# Patient Record
Sex: Male | Born: 1955 | Race: White | Hispanic: No | Marital: Single | Smoking: Never smoker
Health system: Southern US, Community
[De-identification: ages and names within clinical notes are randomized; demographics above are authoritative.]

## PROBLEM LIST (undated history)

## (undated) DIAGNOSIS — M1990 Unspecified osteoarthritis, unspecified site: Secondary | ICD-10-CM

## (undated) DIAGNOSIS — I10 Essential (primary) hypertension: Secondary | ICD-10-CM

## (undated) DIAGNOSIS — K649 Unspecified hemorrhoids: Secondary | ICD-10-CM

## (undated) DIAGNOSIS — E785 Hyperlipidemia, unspecified: Secondary | ICD-10-CM

## (undated) DIAGNOSIS — C801 Malignant (primary) neoplasm, unspecified: Secondary | ICD-10-CM

## (undated) HISTORY — DX: Malignant (primary) neoplasm, unspecified: C80.1

## (undated) HISTORY — PX: MANDIBLE SURGERY: SHX707

## (undated) HISTORY — DX: Unspecified hemorrhoids: K64.9

## (undated) HISTORY — DX: Hyperlipidemia, unspecified: E78.5

## (undated) HISTORY — PX: TONSILLECTOMY: SUR1361

## (undated) HISTORY — DX: Essential (primary) hypertension: I10

---

## 2013-09-11 ENCOUNTER — Ambulatory Visit: Payer: Self-pay | Admitting: General Surgery

## 2017-01-30 ENCOUNTER — Encounter: Payer: Self-pay | Admitting: General Surgery

## 2017-02-28 ENCOUNTER — Encounter: Payer: Self-pay | Admitting: General Surgery

## 2017-02-28 ENCOUNTER — Ambulatory Visit (INDEPENDENT_AMBULATORY_CARE_PROVIDER_SITE_OTHER): Payer: BLUE CROSS/BLUE SHIELD | Admitting: General Surgery

## 2017-02-28 VITALS — BP 122/68 | HR 98 | Resp 12 | Ht 69.0 in | Wt 225.0 lb

## 2017-02-28 DIAGNOSIS — Z1211 Encounter for screening for malignant neoplasm of colon: Secondary | ICD-10-CM | POA: Diagnosis not present

## 2017-02-28 MED ORDER — POLYETHYLENE GLYCOL 3350 17 GM/SCOOP PO POWD: ORAL | 0 refills | Status: DC

## 2017-02-28 NOTE — Progress Notes (Signed)
Patient ID: Ivan Garcia., male   DOB: [birth year], 61 y.o.   MRN: [id number]  Chief Complaint  Patient presents with  . Colonoscopy    HPI [name]. is a 61 y.o. male here today for a evaluation of a screening colonoscopy. Patient states no GI problems at this time. Moves his bowels two to three times daily.  HPI  Past Medical History:  Diagnosis Date  . Hemorrhoids   . Hyperlipidemia   . Hypertension     Past Surgical History:  Procedure Laterality Date  . TONSILLECTOMY      Family History  Problem Relation Age of Onset  . Breast cancer Mother   . Prostate cancer Father   . Brain cancer Father     Social History Social History  Substance Use Topics  . Smoking status: Never Smoker  . Smokeless tobacco: Never Used  . Alcohol use No    No Known Allergies  Current Outpatient Prescriptions  Medication Sig Dispense Refill  . aspirin EC 81 MG tablet Take 81 mg by mouth daily.    . Biotin 10000 MCG TABS Take by mouth.    . irbesartan (AVAPRO) 150 MG tablet Take 150 mg by mouth daily.  2  . Multiple Vitamins-Minerals (MULTIVITAMIN WITH MINERALS) tablet Take 1 tablet by mouth daily.    . niacin 500 MG tablet Take 500 mg by mouth at bedtime.    . rosuvastatin (CRESTOR) 10 MG tablet Take 10 mg by mouth every evening.  4  . zinc gluconate 50 MG tablet Take 50 mg by mouth daily.    . polyethylene glycol powder (GLYCOLAX/MIRALAX) powder 255 grams one bottle for colonoscopy prep 255 g 0   No current facility-administered medications for this visit.     Review of Systems Review of Systems  Constitutional: Negative.   Respiratory: Negative.   Cardiovascular: Negative.   Gastrointestinal: Negative.     Blood pressure 122/68, pulse 98, resp. rate 12, height 5\' 9"  (1.753 m), weight 225 lb (102.1 kg).  Physical Exam Physical Exam  Constitutional: He is oriented to person, place, and time. He appears well-developed and well-nourished.  Cardiovascular:  Normal rate, regular rhythm and normal heart sounds.   Pulmonary/Chest: Effort normal and breath sounds normal.  Abdominal: Normal appearance.  Neurological: He is alert and oriented to person, place, and time.  Skin: Skin is warm and dry.    Data Reviewed Laboratory studies dated 01/12/2017 showed a creatinine of 1.03 with an estimated GFR 78, normal electrolytes. Normal PSA. Elevated cholesterol.  Assessment    Candidate for screening colonoscopy.    Plan        Colonoscopy with possible biopsy/polypectomy prn: Information regarding the procedure, including its potential risks and complications (including but not limited to perforation of the bowel, which may require emergency surgery to repair, and bleeding) was verbally given to the patient. Educational information regarding lower intestinal endoscopy was given to the patient. Written instructions for how to complete the bowel prep using Miralax were provided. The importance of drinking ample fluids to avoid dehydration as a result of the prep emphasized.  Patient has been scheduled for a colonoscopy on 04-04-17 at [facility]. It is okay for patient to continue 81 mg aspirin once daily.   This information has been scribed by Gaspar Cola CMA.   Robert Bellow 02/28/2017, 9:58 AM

## 2017-02-28 NOTE — Patient Instructions (Signed)
Colonoscopy, Adult A colonoscopy is an exam to look at the entire large intestine. During the exam, a lubricated, bendable tube is inserted into the anus and then passed into the rectum, colon, and other parts of the large intestine. A colonoscopy is often done as a part of normal colorectal screening or in response to certain symptoms, such as anemia, persistent diarrhea, abdominal pain, and blood in the stool. The exam can help screen for and diagnose medical problems, including:  Tumors.  Polyps.  Inflammation.  Areas of bleeding. Tell a health care provider about:  Any allergies you have.  All medicines you are taking, including vitamins, herbs, eye drops, creams, and over-the-counter medicines.  Any problems you or family members have had with anesthetic medicines.  Any blood disorders you have.  Any surgeries you have had.  Any medical conditions you have.  Any problems you have had passing stool. What are the risks? Generally, this is a safe procedure. However, problems may occur, including:  Bleeding.  A tear in the intestine.  A reaction to medicines given during the exam.  Infection (rare). What happens before the procedure? Eating and drinking restrictions  Follow instructions from your health care provider about eating and drinking, which may include:  A few days before the procedure - follow a low-fiber diet. Avoid nuts, seeds, dried fruit, raw fruits, and vegetables.  1-3 days before the procedure - follow a clear liquid diet. Drink only clear liquids, such as clear broth or bouillon, black coffee or tea, clear juice, clear soft drinks or sports drinks, gelatin dessert, and popsicles. Avoid any liquids that contain red or purple dye.  On the day of the procedure - do not eat or drink anything during the 2 hours before the procedure, or within the time period that your health care provider recommends. Bowel prep  If you were prescribed an oral bowel prep to  clean out your colon:  Take it as told by your health care provider. Starting the day before your procedure, you will need to drink a large amount of medicated liquid. The liquid will cause you to have multiple loose stools until your stool is almost clear or light green.  If your skin or anus gets irritated from diarrhea, you may use these to relieve the irritation:  Medicated wipes, such as adult wet wipes with aloe and vitamin E.  A skin soothing-product like petroleum jelly.  If you vomit while drinking the bowel prep, take a break for up to 60 minutes and then begin the bowel prep again. If vomiting continues and you cannot take the bowel prep without vomiting, call your health care provider. General instructions   Ask your health care provider about changing or stopping your regular medicines. This is especially important if you are taking diabetes medicines or blood thinners.  Plan to have someone take you home from the hospital or clinic. What happens during the procedure?  An IV tube may be inserted into one of your veins.  You will be given medicine to help you relax (sedative).  To reduce your risk of infection:  Your health care team will wash or sanitize their hands.  Your anal area will be washed with soap.  You will be asked to lie on your side with your knees bent.  Your health care provider will lubricate a long, thin, flexible tube. The tube will have a camera and a light on the end.  The tube will be inserted into your anus.    The tube will be gently eased through your rectum and colon.  Air will be delivered into your colon to keep it open. You may feel some pressure or cramping.  The camera will be used to take images during the procedure.  A small tissue sample may be removed from your body to be examined under a microscope (biopsy). If any potential problems are found, the tissue will be sent to a lab for testing.  If small polyps are found, your health  care provider may remove them and have them checked for cancer cells.  The tube that was inserted into your anus will be slowly removed. The procedure may vary among health care providers and hospitals. What happens after the procedure?  Your blood pressure, heart rate, breathing rate, and blood oxygen level will be monitored until the medicines you were given have worn off.  Do not drive for 24 hours after the exam.  You may have a small amount of blood in your stool.  You may pass gas and have mild abdominal cramping or bloating due to the air that was used to inflate your colon during the exam.  It is up to you to get the results of your procedure. Ask your health care provider, or the department performing the procedure, when your results will be ready. This information is not intended to replace advice given to you by your health care provider. Make sure you discuss any questions you have with your health care provider. Document Released: 11/17/2000 Document Revised: 09/20/2016 Document Reviewed: 02/01/2016 Elsevier Interactive Patient Education  2017 [employer]  

## 2017-03-10 IMAGING — US US BIOPSY LYMPH NODE
1 series · 14 of 25 positions shown · non-contrast
Comparison: none

INDICATION: Right neck mass

[Series 1: us biopsy lymph node · 0.08mm/px · 14 of 32 slices shown]
[im 1/32]
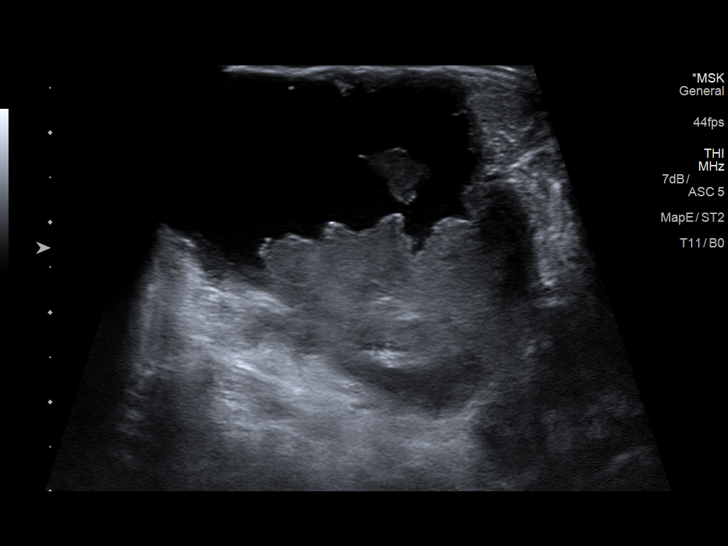
[im 3/32]
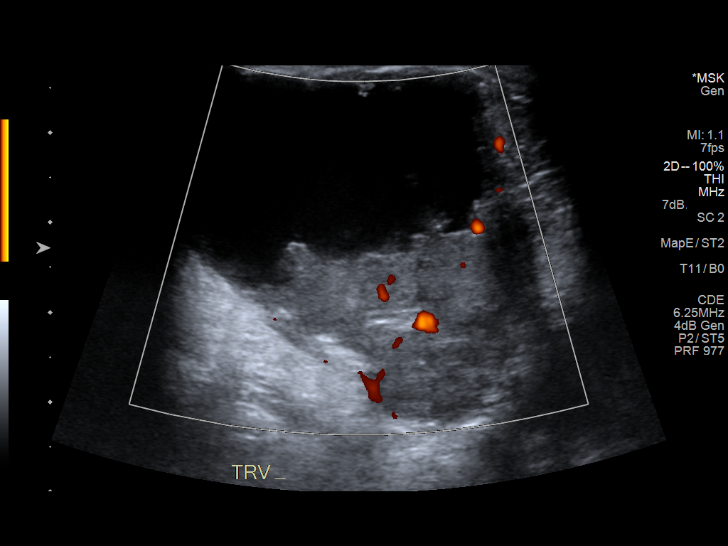
[im 6/32]
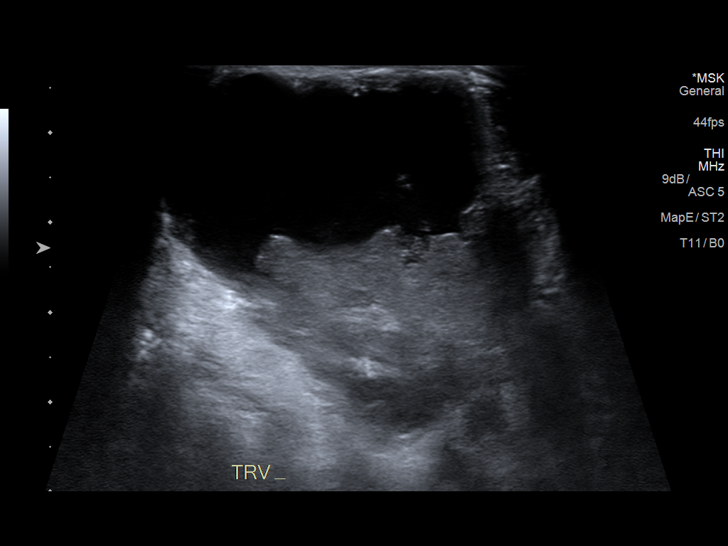
[im 8/32]
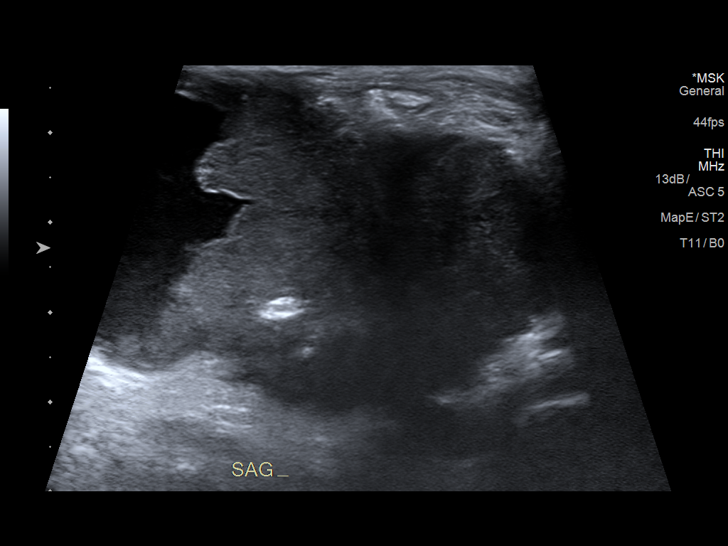
[im 11/32]
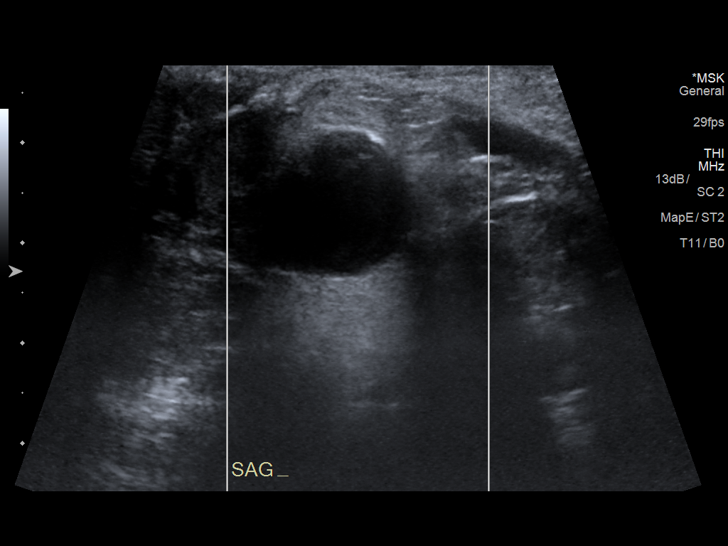
[im 12/32]
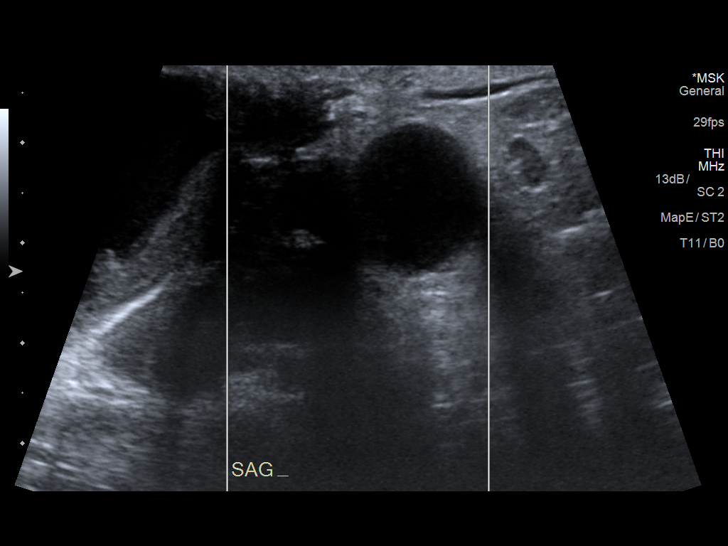
[im 15/32]
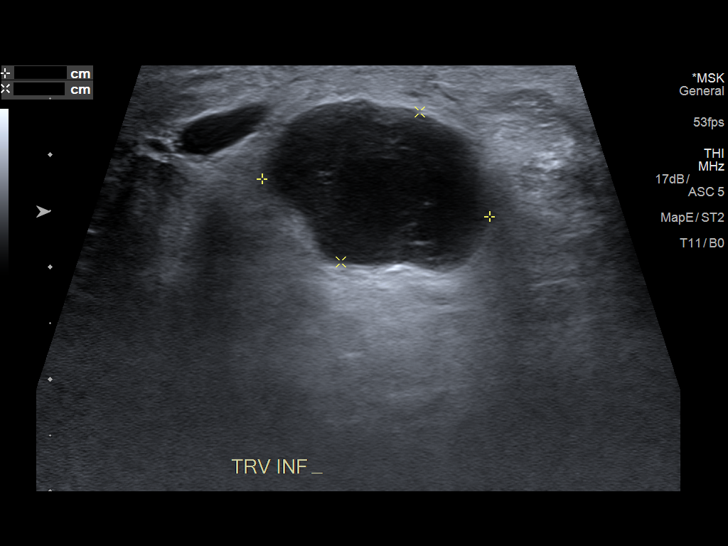
[im 17/32]
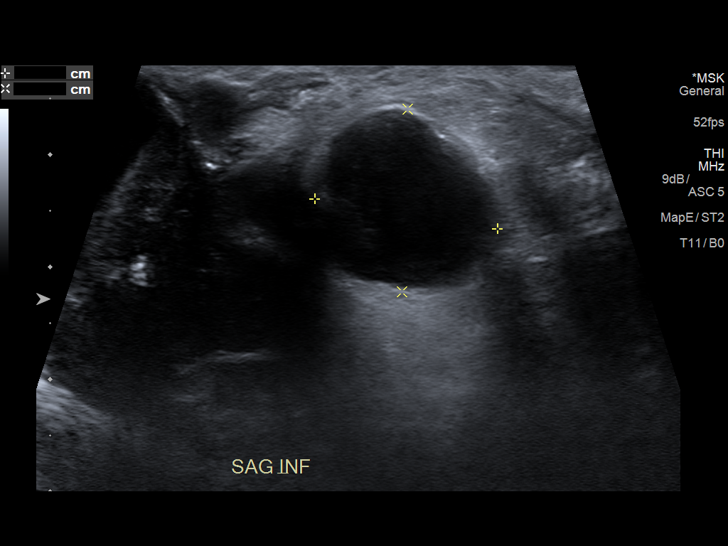
[im 20/32]
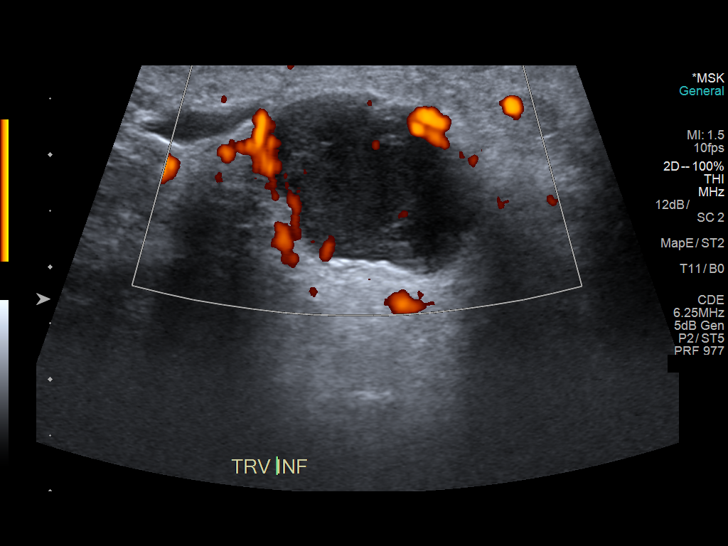
[im 21/32]
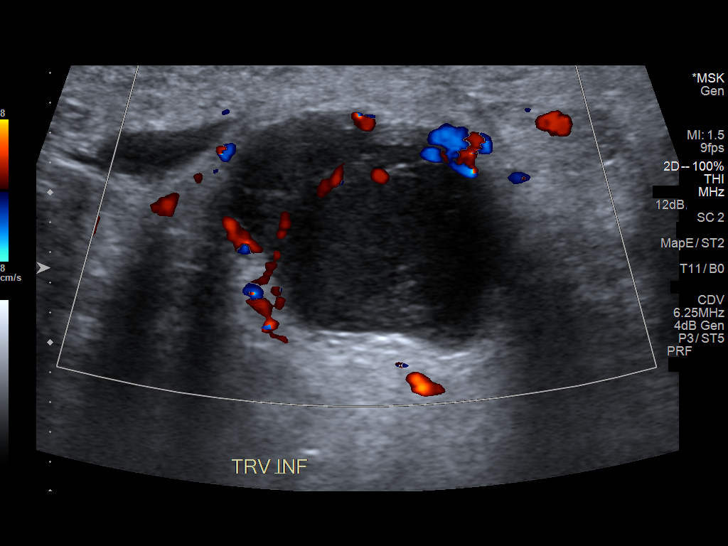
[im 24/32]
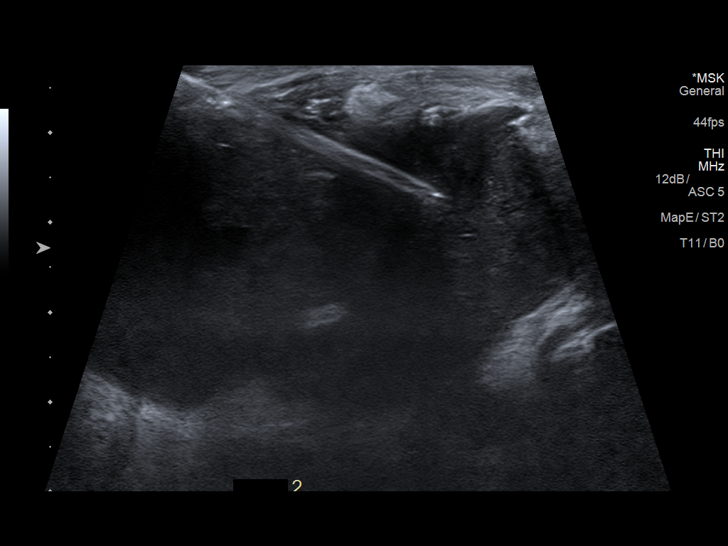
[im 26/32]
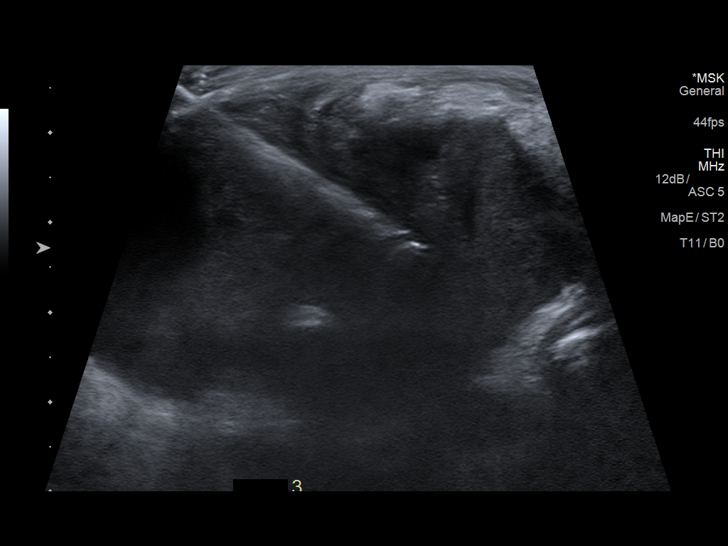
[im 29/32]
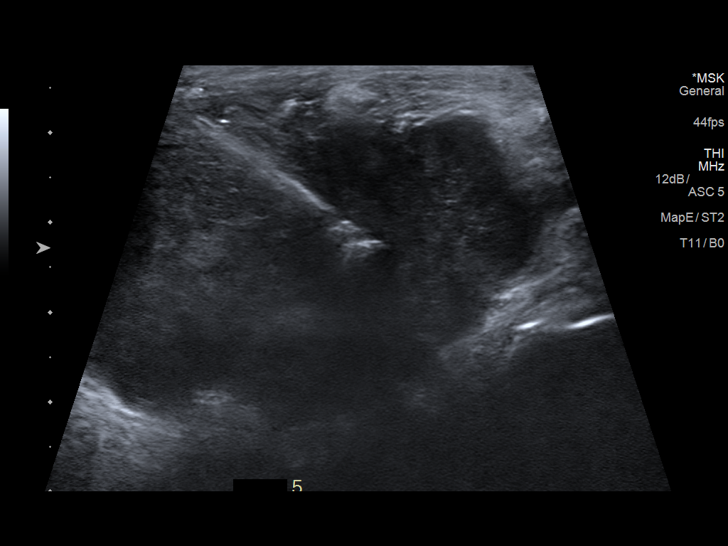
[im 32/32]
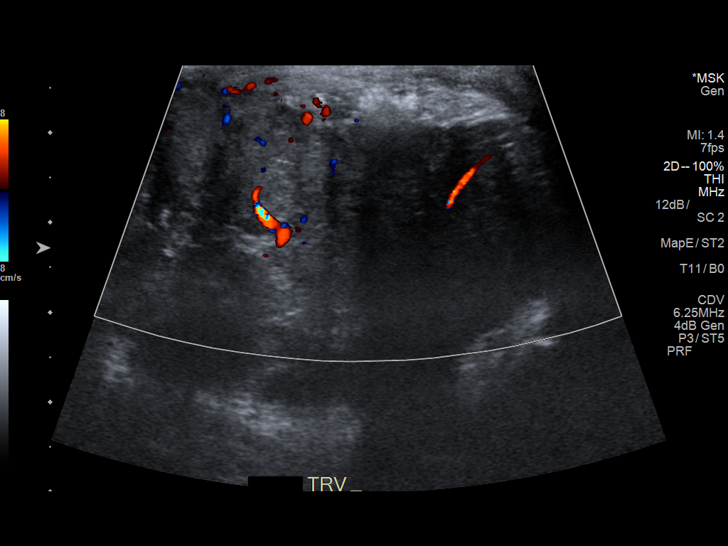

[14 of 25 positions shown; findings below may reference images not displayed]

EXAM:
ULTRASOUND GUIDED CORE BIOPSY OF A RIGHT NECK MASS

MEDICATIONS:
None.

ANESTHESIA/SEDATION:
Fentanyl 75 mcg IV; Versed Three mg IV

Moderate Sedation Time:  13

The patient was continuously monitored during the procedure by the
interventional radiology nurse under my direct supervision.

PROCEDURE:
The procedure, risks, benefits, and alternatives were explained to
the patient. Questions regarding the procedure were encouraged and
answered. The patient understands and consents to the procedure.

The right neck was prepped with ChloraPrep in a sterile fashion, and
a sterile drape was applied covering the operative field. A sterile
gown and sterile gloves were used for the procedure. Local
anesthesia was provided with 1% Lidocaine.

Under ultrasound guidance, 6 18 gauge cores of the right neck mass
were obtained. Samples were placed in both saline hand formalin.

COMPLICATIONS:
None immediate.
FINDINGS: Images document needle placement in the right neck mass. Post biopsy
images demonstrate no evidence of hemorrhage.
IMPRESSION: Successful ultrasound-guided core biopsy of a right neck mass.

## 2017-03-28 ENCOUNTER — Telehealth: Payer: Self-pay | Admitting: *Deleted

## 2017-03-28 NOTE — Telephone Encounter (Signed)
Patient was contacted today and confirms no medication changes since his last office visit.   This patient reports that he has picked up Miralax prescription.  We will proceed with colonoscopy as scheduled for 04-04-17 at [facility].   Patient was encouraged to call the office should he have further questions.

## 2017-04-02 DIAGNOSIS — I10 Essential (primary) hypertension: Secondary | ICD-10-CM | POA: Diagnosis not present

## 2017-04-02 DIAGNOSIS — Z7982 Long term (current) use of aspirin: Secondary | ICD-10-CM | POA: Diagnosis not present

## 2017-04-02 DIAGNOSIS — K5730 Diverticulosis of large intestine without perforation or abscess without bleeding: Secondary | ICD-10-CM | POA: Diagnosis not present

## 2017-04-02 DIAGNOSIS — E785 Hyperlipidemia, unspecified: Secondary | ICD-10-CM | POA: Diagnosis not present

## 2017-04-02 DIAGNOSIS — Z79899 Other long term (current) drug therapy: Secondary | ICD-10-CM | POA: Diagnosis not present

## 2017-04-02 DIAGNOSIS — Z1211 Encounter for screening for malignant neoplasm of colon: Secondary | ICD-10-CM | POA: Diagnosis present

## 2017-04-04 ENCOUNTER — Encounter
Admission: RE | Disposition: A | Discharge status: Home / Self Care | Payer: Self-pay | Source: Ambulatory Visit | Attending: General Surgery

## 2017-04-04 ENCOUNTER — Encounter: Payer: Self-pay | Admitting: *Deleted

## 2017-04-04 ENCOUNTER — Ambulatory Visit: Payer: BLUE CROSS/BLUE SHIELD | Admitting: Anesthesiology

## 2017-04-04 ENCOUNTER — Ambulatory Visit
Admission: RE | Admit: 2017-04-04 | Discharge: 2017-04-04 | Disposition: A | Discharge status: Home / Self Care | Payer: BLUE CROSS/BLUE SHIELD | Source: Ambulatory Visit | Attending: General Surgery | Admitting: General Surgery

## 2017-04-04 DIAGNOSIS — Z79899 Other long term (current) drug therapy: Secondary | ICD-10-CM | POA: Insufficient documentation

## 2017-04-04 DIAGNOSIS — Z1211 Encounter for screening for malignant neoplasm of colon: Secondary | ICD-10-CM | POA: Diagnosis not present

## 2017-04-04 DIAGNOSIS — K5730 Diverticulosis of large intestine without perforation or abscess without bleeding: Secondary | ICD-10-CM | POA: Insufficient documentation

## 2017-04-04 DIAGNOSIS — E785 Hyperlipidemia, unspecified: Secondary | ICD-10-CM | POA: Insufficient documentation

## 2017-04-04 DIAGNOSIS — Z7982 Long term (current) use of aspirin: Secondary | ICD-10-CM | POA: Insufficient documentation

## 2017-04-04 DIAGNOSIS — I10 Essential (primary) hypertension: Secondary | ICD-10-CM | POA: Insufficient documentation

## 2017-04-04 HISTORY — PX: COLONOSCOPY WITH PROPOFOL: SHX5780

## 2017-04-04 SURGERY — COLONOSCOPY WITH PROPOFOL
Anesthesia: General

## 2017-04-04 MED ORDER — PROPOFOL 500 MG/50ML IV EMUL
INTRAVENOUS | Status: AC
  Filled 2017-04-04: qty 50

## 2017-04-04 MED ORDER — PROPOFOL 10 MG/ML IV BOLUS
INTRAVENOUS | Status: DC | PRN
  Administered 2017-04-04: 50 mg via INTRAVENOUS

## 2017-04-04 MED ORDER — SODIUM CHLORIDE 0.9 % IV SOLN
INTRAVENOUS | Status: DC
  Administered 2017-04-04 (×2): via INTRAVENOUS

## 2017-04-04 MED ORDER — MIDAZOLAM HCL 2 MG/2ML IJ SOLN
INTRAMUSCULAR | Status: AC
  Filled 2017-04-04: qty 2

## 2017-04-04 MED ORDER — PROPOFOL 500 MG/50ML IV EMUL
INTRAVENOUS | Status: DC | PRN
  Administered 2017-04-04: 150 ug/kg/min via INTRAVENOUS

## 2017-04-04 MED ORDER — MIDAZOLAM HCL 2 MG/2ML IJ SOLN
INTRAMUSCULAR | Status: DC | PRN
  Administered 2017-04-04: 2 mg via INTRAVENOUS

## 2017-04-04 MED ORDER — LIDOCAINE HCL (PF) 2 % IJ SOLN
INTRAMUSCULAR | Status: AC
  Filled 2017-04-04: qty 2

## 2017-04-04 MED ORDER — LIDOCAINE HCL (CARDIAC) 20 MG/ML IV SOLN
INTRAVENOUS | Status: DC | PRN
  Administered 2017-04-04: 60 mg via INTRAVENOUS

## 2017-04-04 NOTE — Transfer of Care (Signed)
Immediate Anesthesia Transfer of Care Note  Patient: Ivan Garcia.  Procedure(s) Performed: Procedure(s): COLONOSCOPY WITH PROPOFOL (N/A)  Patient Location: Endoscopy Unit  Anesthesia Type:General  Level of Consciousness: awake and patient cooperative  Airway & Oxygen Therapy: Patient Spontanous Breathing and Patient connected to nasal cannula oxygen  Post-op Assessment: Report given to RN, Post -op Vital signs reviewed and stable and Patient moving all extremities X 4  Post vital signs: Reviewed and stable  Last Vitals:  Vitals:   04/04/17 0823  BP: (!) 151/92  Pulse: 80  Resp: 18  Temp: (!) 35.9 C    Last Pain:  Vitals:   04/04/17 0823  TempSrc: Tympanic         Complications: No apparent anesthesia complications

## 2017-04-04 NOTE — H&P (Signed)
Ivan Garcia. [id number] 03-25-1956     HPI: Healthy 61 y.o male screening colonoscopy. Tolerated prep well.   Prescriptions Prior to Admission  Medication Sig Dispense Refill Last Dose  . aspirin EC 81 MG tablet Take 81 mg by mouth daily.   Past Week at Unknown time  . Biotin 10000 MCG TABS Take by mouth.   Past Week at Unknown time  . irbesartan (AVAPRO) 150 MG tablet Take 150 mg by mouth daily.  2 04/03/2017 at Unknown time  . Multiple Vitamins-Minerals (MULTIVITAMIN WITH MINERALS) tablet Take 1 tablet by mouth daily.   Past Week at Unknown time  . rosuvastatin (CRESTOR) 10 MG tablet Take 10 mg by mouth every evening.  4 Past Week at Unknown time  . zinc gluconate 50 MG tablet Take 50 mg by mouth daily.   Past Week at Unknown time  . niacin 500 MG tablet Take 500 mg by mouth at bedtime.   Taking  . polyethylene glycol powder (GLYCOLAX/MIRALAX) powder 255 grams one bottle for colonoscopy prep 255 g 0    No Known Allergies Past Medical History:  Diagnosis Date  . Hemorrhoids   . Hyperlipidemia   . Hypertension    Past Surgical History:  Procedure Laterality Date  . TONSILLECTOMY     Social History   Social History  . Marital status: Single    Spouse name: N/A  . Number of children: N/A  . Years of education: N/A   Occupational History  . Not on file.   Social History Main Topics  . Smoking status: Never Smoker  . Smokeless tobacco: Never Used  . Alcohol use No  . Drug use: No  . Sexual activity: Not on file   Other Topics Concern  . Not on file   Social History Narrative  . No narrative on file   Social History   Social History Narrative  . No narrative on file     ROS: Negative.     PE: HEENT: Negative. Lungs: Clear. Cardio: RR.  Assessment/Plan:  Proceed with planned endoscopy.  Robert Bellow 04/04/2017

## 2017-04-04 NOTE — Anesthesia Preprocedure Evaluation (Addendum)
Anesthesia Evaluation  Patient identified by MRN, date of birth, ID band Patient awake    Reviewed: Allergy & Precautions, NPO status , Patient's Chart, lab work & pertinent test results  Airway Mallampati: II  TM Distance: <3 FB     Dental  (+) Caps, Chipped   Pulmonary neg pulmonary ROS,    Pulmonary exam normal        Cardiovascular hypertension, Normal cardiovascular exam     Neuro/Psych negative neurological ROS  negative psych ROS   GI/Hepatic Neg liver ROS, hemorrhoids   Endo/Other  negative endocrine ROS  Renal/GU negative Renal ROS  negative genitourinary   Musculoskeletal negative musculoskeletal ROS (+)   Abdominal Normal abdominal exam  (+)   Peds negative pediatric ROS (+)  Hematology negative hematology ROS (+)   Anesthesia Other Findings Past Medical History: No date: Hemorrhoids No date: Hyperlipidemia No date: Hypertension  Reproductive/Obstetrics                            Anesthesia Physical Anesthesia Plan  ASA: II  Anesthesia Plan: General   Post-op Pain Management:    Induction: Intravenous  Airway Management Planned: Nasal Cannula  Additional Equipment:   Intra-op Plan:   Post-operative Plan:   Informed Consent: I have reviewed the patients History and Physical, chart, labs and discussed the procedure including the risks, benefits and alternatives for the proposed anesthesia with the patient or authorized representative who has indicated his/her understanding and acceptance.   Dental advisory given  Plan Discussed with: CRNA and Surgeon  Anesthesia Plan Comments:         Anesthesia Quick Evaluation

## 2017-04-04 NOTE — Op Note (Signed)
[facility] Patient Name: Ivan Garcia Procedure Date: 04/04/2017 9:19 AM MRN: [id number] Account #: [id number] Date of Birth: 07-06-1956 Admit Type: Outpatient Age: 61 Room: [facility] ENDO ROOM 1 Gender: Male Note Status: Finalized Procedure:            Colonoscopy Indications:          Screening for colorectal malignant neoplasm Providers:            Robert Bellow, MD Referring MD:         Leona Carry. Hall Busing, MD (Referring MD) Medicines:            Monitored Anesthesia Care Complications:        No immediate complications. Procedure:            Pre-Anesthesia Assessment:                       - Prior to the procedure, a History and Physical was                        performed, and patient medications, allergies and                        sensitivities were reviewed. The patient's tolerance of                        previous anesthesia was reviewed.                       - The risks and benefits of the procedure and the                        sedation options and risks were discussed with the                        patient. All questions were answered and informed                        consent was obtained.                       After obtaining informed consent, the colonoscope was                        passed under direct vision. Throughout the procedure,                        the patient's blood pressure, pulse, and oxygen                        saturations were monitored continuously. The                        Colonoscope was introduced through the anus and                        advanced to the the cecum, identified by appendiceal                        orifice and ileocecal valve. The colonoscopy was  performed without difficulty. The patient tolerated the                        procedure well. The quality of the bowel preparation                        was excellent. Findings:      Many medium-mouthed diverticula  were found in the sigmoid colon,       ascending colon and cecum.      The retroflexed view of the distal rectum and anal verge was normal and       showed no anal or rectal abnormalities. Impression:           - Mild diverticulosis in the sigmoid colon, in the                        ascending colon and in the cecum.                       - The distal rectum and anal verge are normal on                        retroflexion view.                       - No specimens collected. Recommendation:       - Repeat colonoscopy in 10 years for screening purposes. Procedure Code(s):    --- Professional ---                       [phone], Colonoscopy, flexible; diagnostic, including                        collection of specimen(s) by brushing or washing, when                        performed (separate procedure) Diagnosis Code(s):    --- Professional ---                       Z12.11, Encounter for screening for malignant neoplasm                        of colon                       K57.30, Diverticulosis of large intestine without                        perforation or abscess without bleeding CPT copyright 2016 American Medical Association. All rights reserved. The codes documented in this report are preliminary and upon coder review may  be revised to meet current compliance requirements. Robert Bellow, MD 04/04/2017 9:46:05 AM This report has been signed electronically. Number of Addenda: 0 Note Initiated On: 04/04/2017 9:19 AM Scope Withdrawal Time: 0 hours 7 minutes 4 seconds  Total Procedure Duration: 0 hours 12 minutes 55 seconds       [facility]

## 2017-04-04 NOTE — Anesthesia Postprocedure Evaluation (Signed)
Anesthesia Post Note  Patient: Geran Haithcock.  Procedure(s) Performed: Procedure(s) (LRB): COLONOSCOPY WITH PROPOFOL (N/A)  Patient location during evaluation: PACU Anesthesia Type: General Level of consciousness: awake and alert and oriented Pain management: pain level controlled Vital Signs Assessment: post-procedure vital signs reviewed and stable Respiratory status: spontaneous breathing Cardiovascular status: blood pressure returned to baseline Anesthetic complications: no     Last Vitals:  Vitals:   04/04/17 0823 04/04/17 0947  BP: (!) 151/92 98/87  Pulse: 80 78  Resp: 18 15  Temp: (!) 35.9 C 36.2 C    Last Pain:  Vitals:   04/04/17 0947  TempSrc: Tympanic                 Rehmat Murtagh

## 2017-04-04 NOTE — Anesthesia Post-op Follow-up Note (Cosign Needed)
Anesthesia QCDR form completed.        

## 2017-04-05 ENCOUNTER — Encounter: Payer: Self-pay | Admitting: General Surgery

## 2017-04-05 NOTE — Addendum Note (Signed)
Addendum  created 04/05/17 1855 by Silvana Newness, CRNA   Charge Capture section accepted

## 2017-06-29 IMAGING — US US EXTREM LOW VENOUS BILAT
1 series · 13 of 24 positions shown · non-contrast
Comparison: None.

CLINICAL DATA: Bilateral lower extremity edema



[Series 1: us extrem low venous bilat · 0.07mm/px · 13 of 60 slices shown]
[im 1/60]
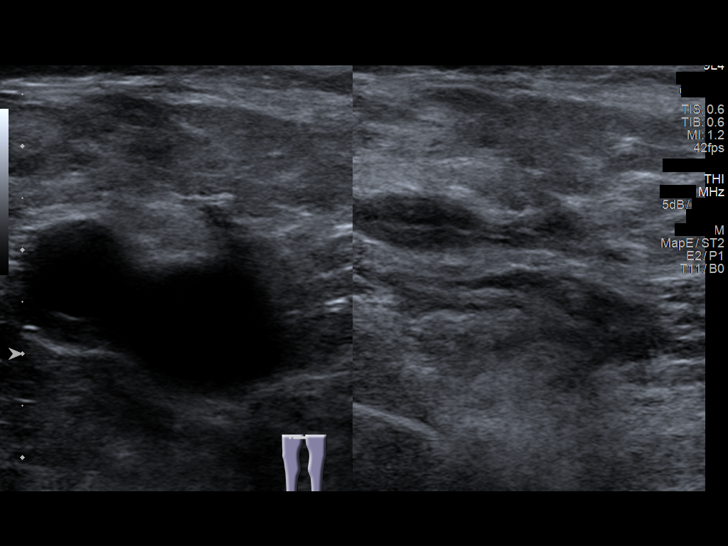
[im 6/60]
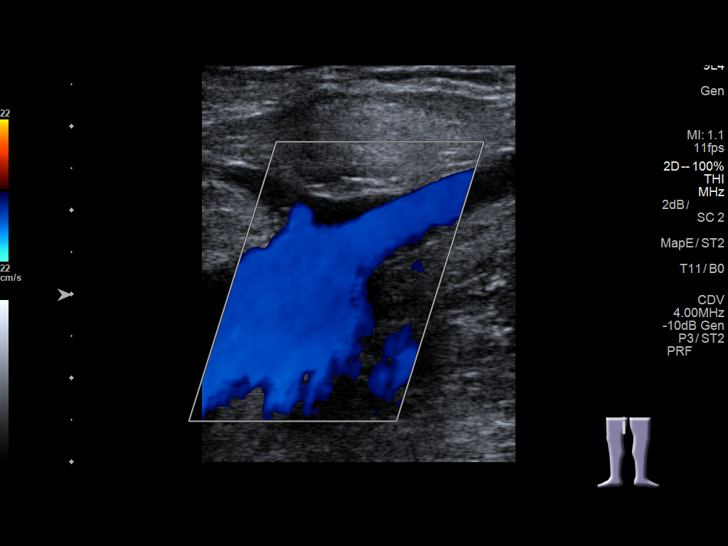
[im 11/60]
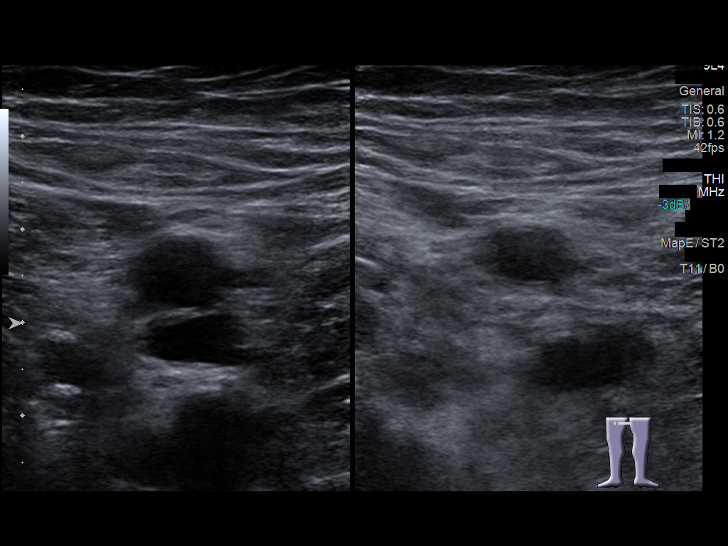
[im 16/60]
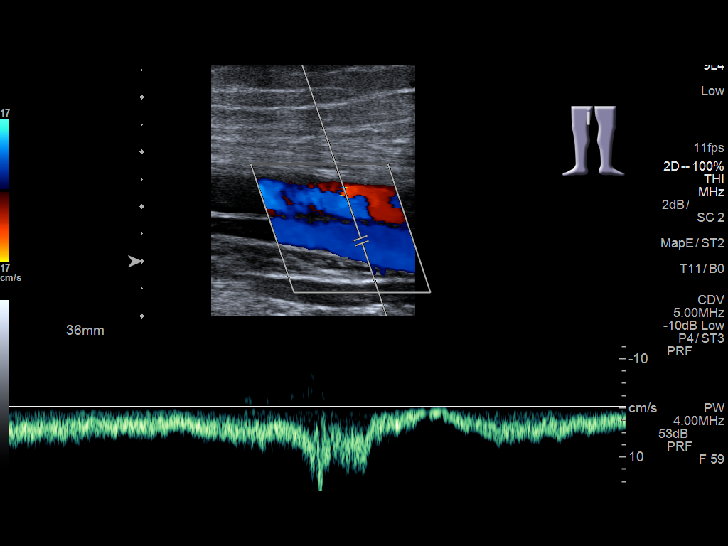
[im 21/60]
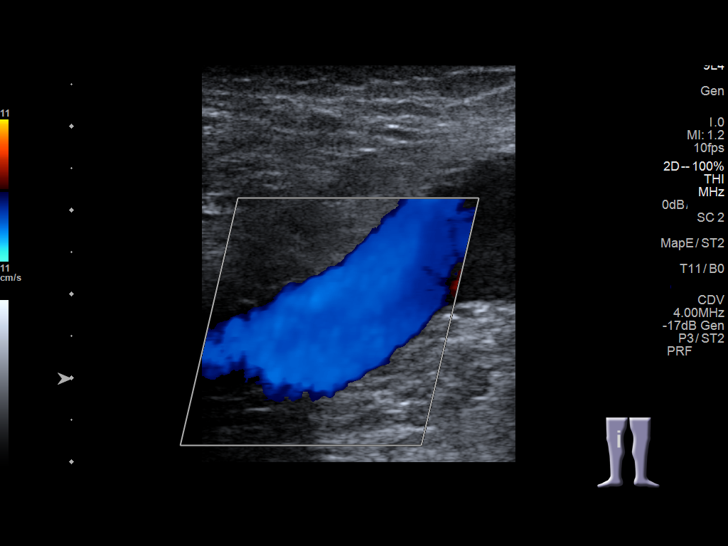
[im 26/60]
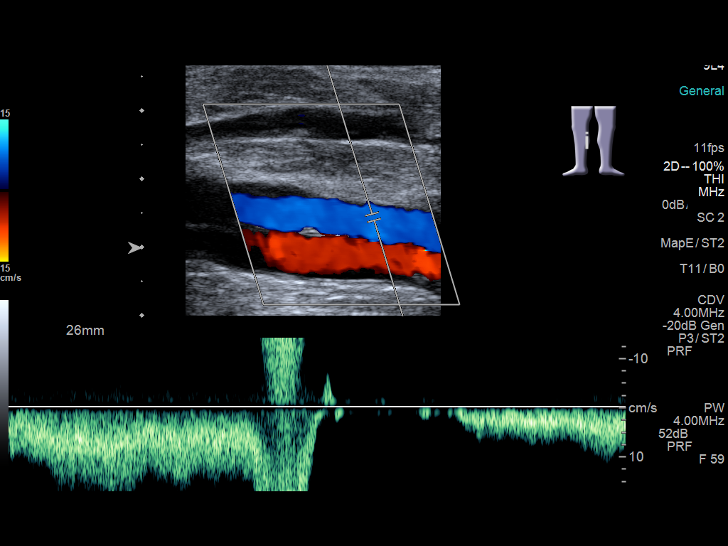
[im 31/60]
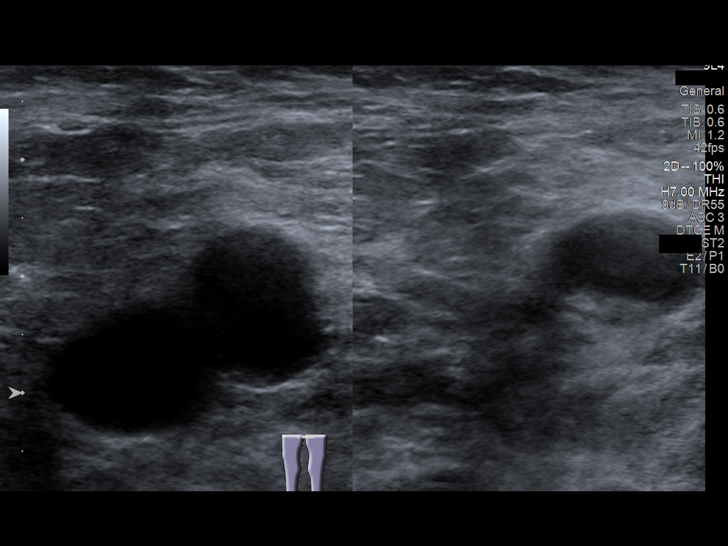
[im 34/60]
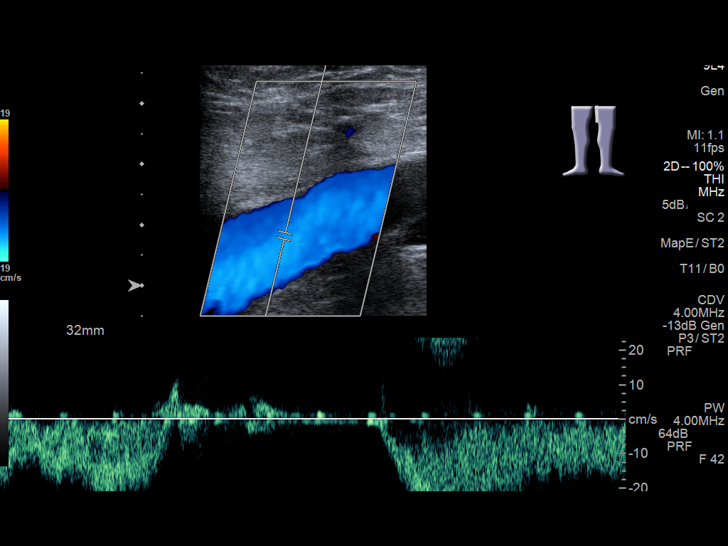
[im 39/60]
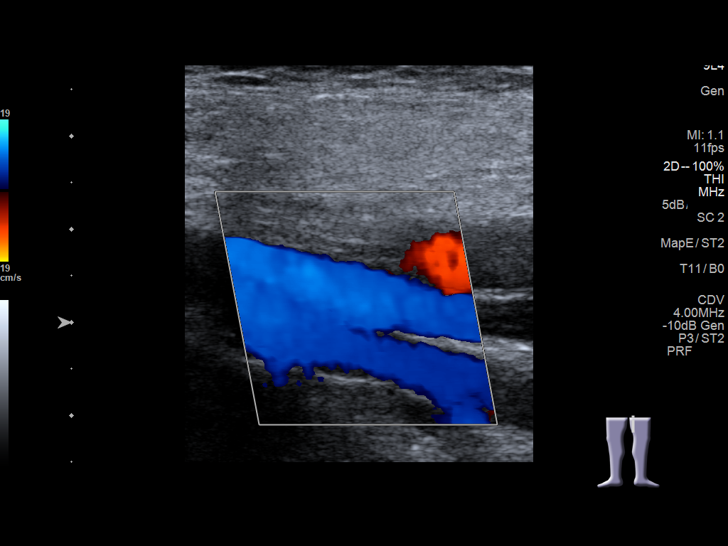
[im 44/60]
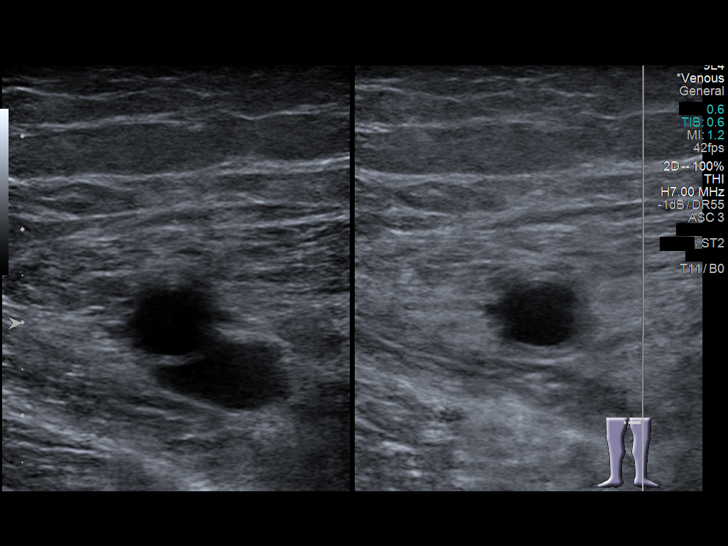
[im 49/60]
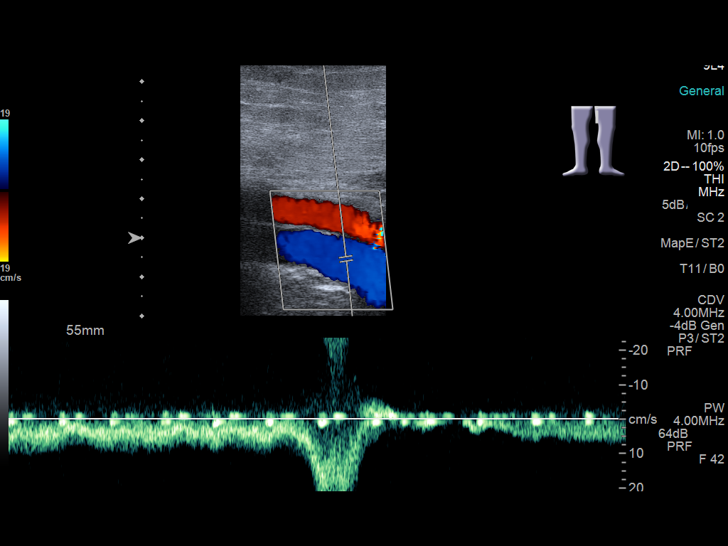
[im 54/60]
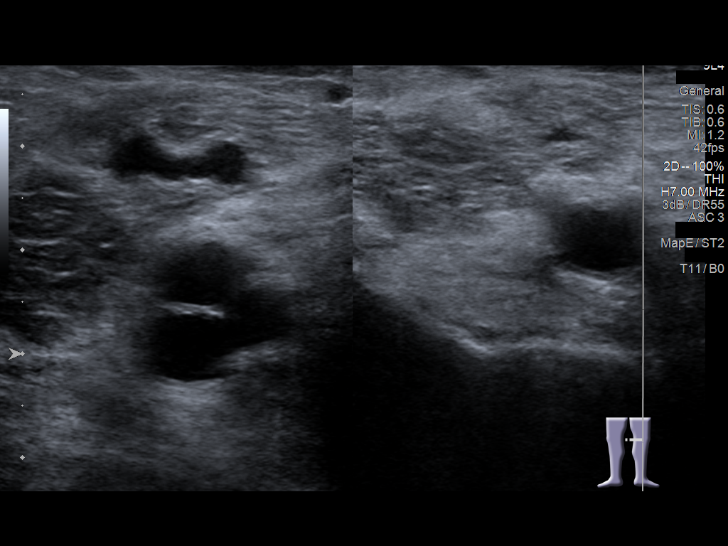
[im 60/60]
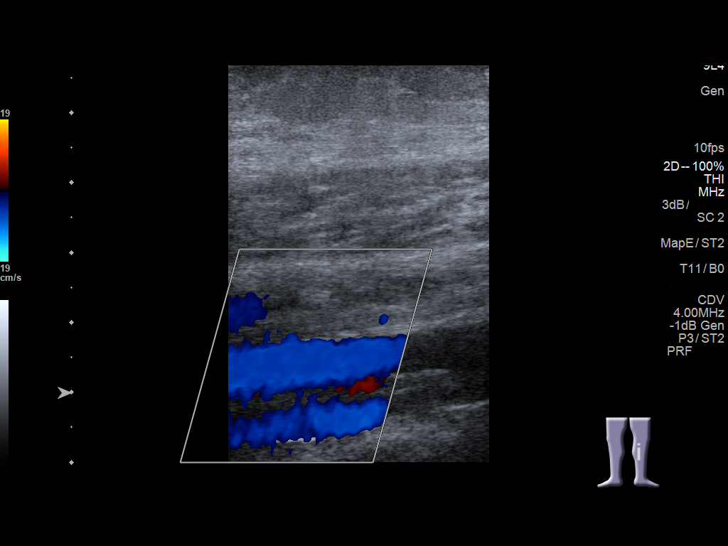

[13 of 24 positions shown; findings below may reference images not displayed]

FINDINGS: RIGHT LOWER EXTREMITY

Common Femoral Vein: No evidence of thrombus. Normal
compressibility, respiratory phasicity and response to augmentation.

Saphenofemoral Junction: No evidence of thrombus. Normal
compressibility and flow on color Doppler imaging.

Profunda Femoral Vein: No evidence of thrombus. Normal
compressibility and flow on color Doppler imaging.

Femoral Vein: No evidence of thrombus. Normal compressibility,
respiratory phasicity and response to augmentation.

Popliteal Vein: No evidence of thrombus. Normal compressibility,
respiratory phasicity and response to augmentation.

Calf Veins: No evidence of thrombus. Normal compressibility and flow
on color Doppler imaging.

Superficial Great Saphenous Vein: No evidence of thrombus. Normal
compressibility.

Venous Reflux:  None.

Other Findings:  None.

LEFT LOWER EXTREMITY

Common Femoral Vein: No evidence of thrombus. Normal
compressibility, respiratory phasicity and response to augmentation.

Saphenofemoral Junction: No evidence of thrombus. Normal
compressibility and flow on color Doppler imaging.

Profunda Femoral Vein: No evidence of thrombus. Normal
compressibility and flow on color Doppler imaging.

Femoral Vein: No evidence of thrombus. Normal compressibility,
respiratory phasicity and response to augmentation.

Popliteal Vein: No evidence of thrombus. Normal compressibility,
respiratory phasicity and response to augmentation.

Calf Veins: No evidence of thrombus. Normal compressibility and flow
on color Doppler imaging.

Superficial Great Saphenous Vein: No evidence of thrombus. Normal
compressibility.

Venous Reflux:  None.

Other Findings:  None.
IMPRESSION: No evidence of deep venous thrombosis.

## 2017-11-07 ENCOUNTER — Ambulatory Visit
Admission: RE | Admit: 2017-11-07 | Discharge: 2017-11-07 | Disposition: A | Discharge status: Home / Self Care | Payer: BLUE CROSS/BLUE SHIELD | Source: Ambulatory Visit | Attending: Internal Medicine | Admitting: Internal Medicine

## 2017-11-07 ENCOUNTER — Other Ambulatory Visit: Payer: Self-pay | Admitting: Internal Medicine

## 2017-11-07 DIAGNOSIS — Z87891 Personal history of nicotine dependence: Secondary | ICD-10-CM | POA: Insufficient documentation

## 2017-11-07 DIAGNOSIS — R221 Localized swelling, mass and lump, neck: Secondary | ICD-10-CM | POA: Diagnosis not present

## 2017-11-07 IMAGING — DX DG CHEST 2V
3 series · 3 of 3 positions shown · non-contrast
Comparison: None.

CLINICAL DATA: Right neck mass.

EXAM:
CHEST  2 VIEW

[chest pa (1 of 2)]
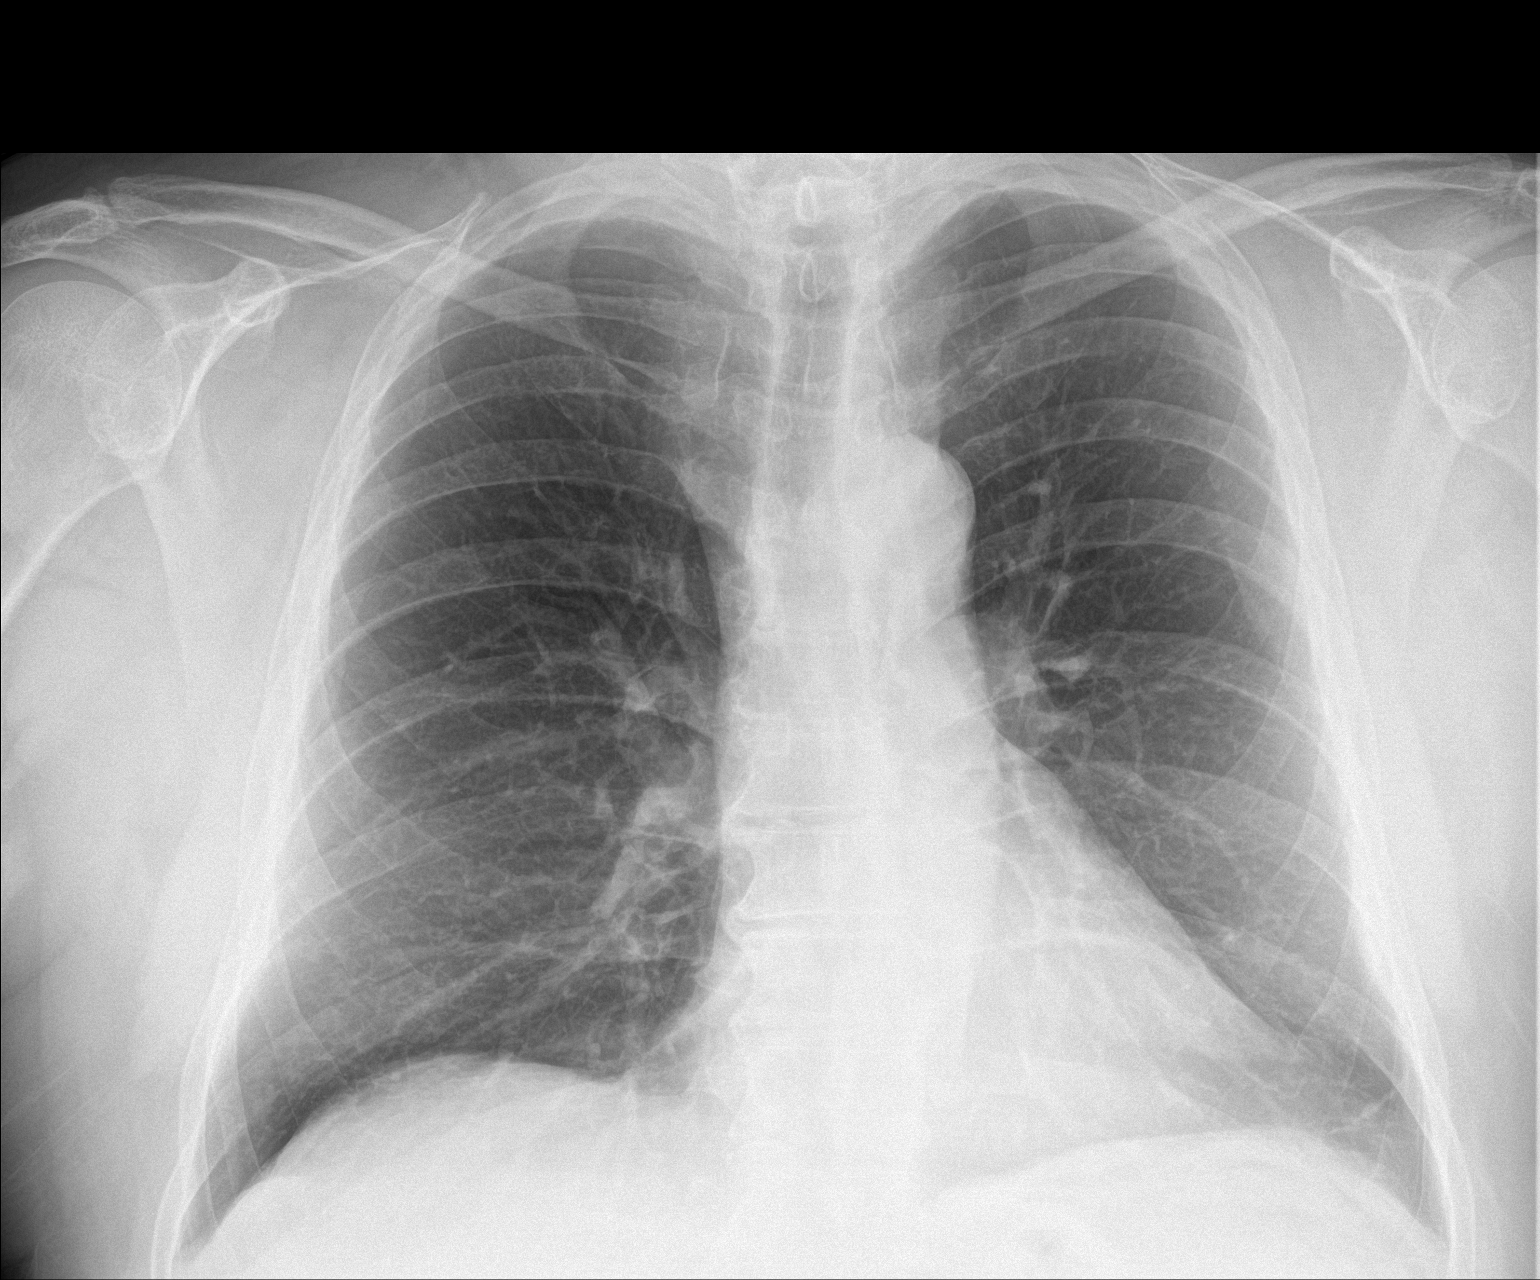

[chest lat]
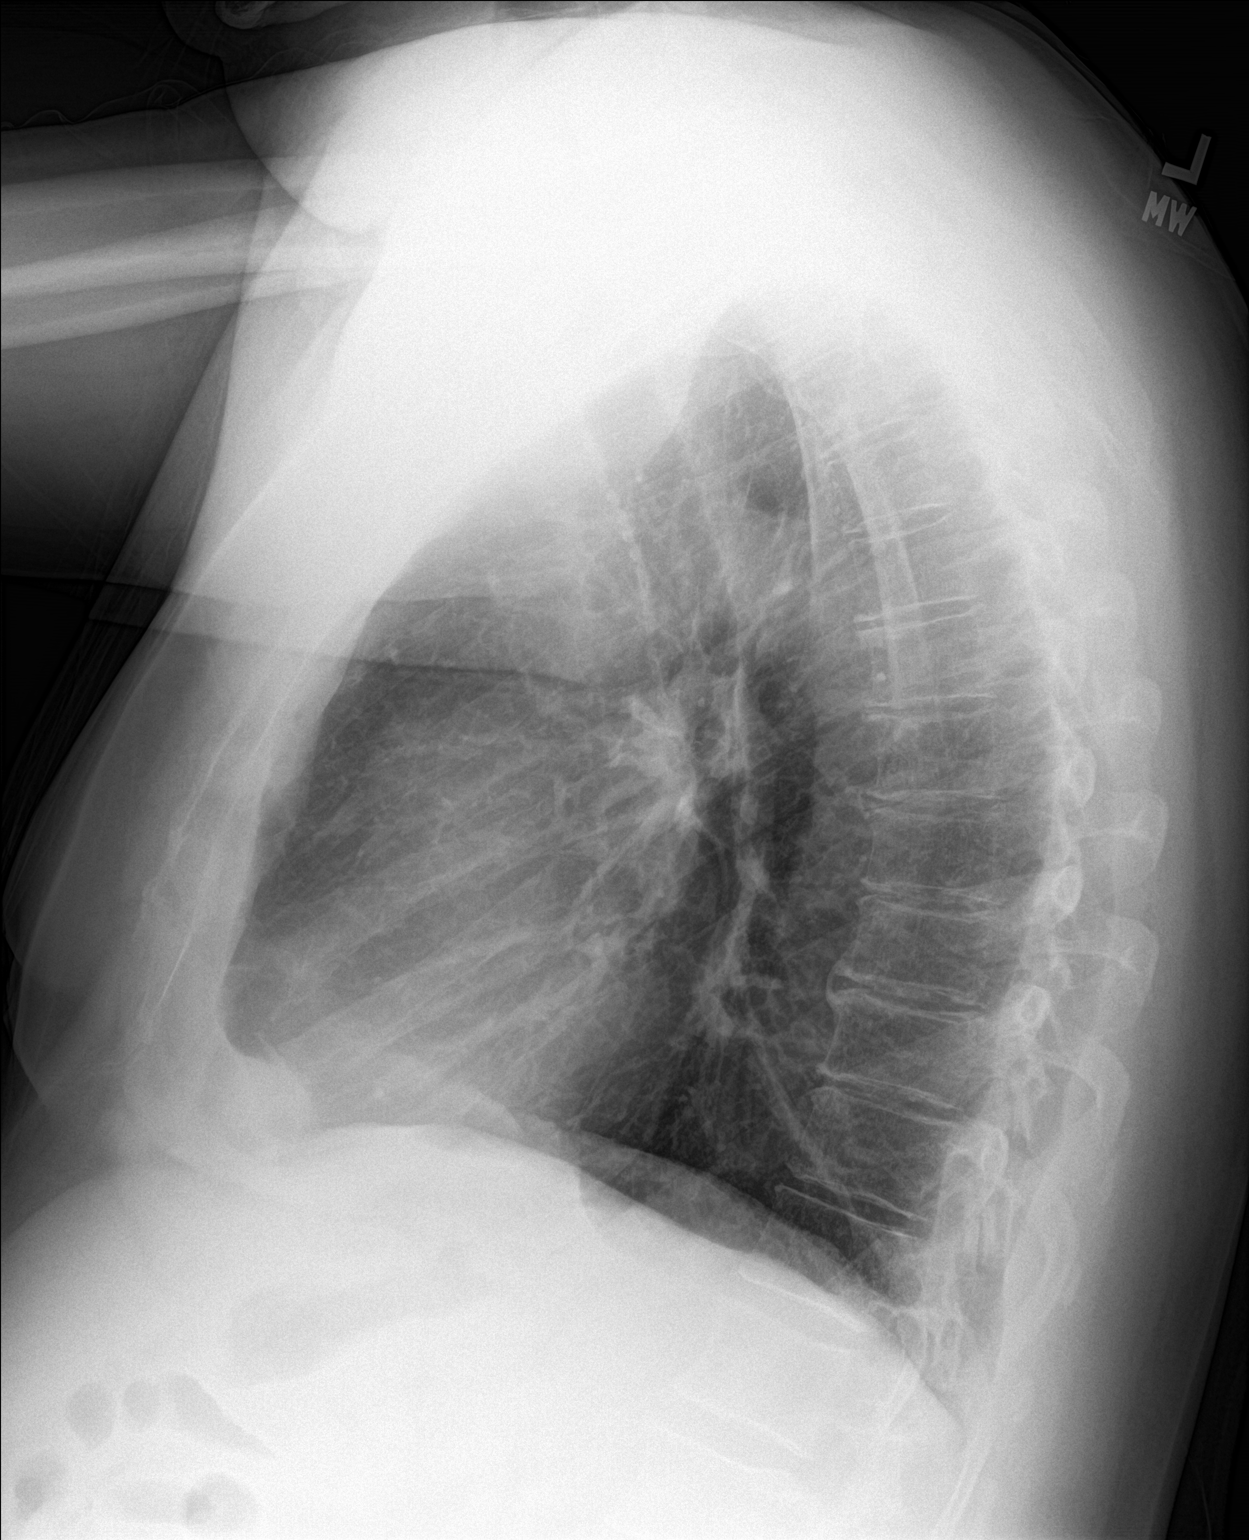

[chest pa (2 of 2)]
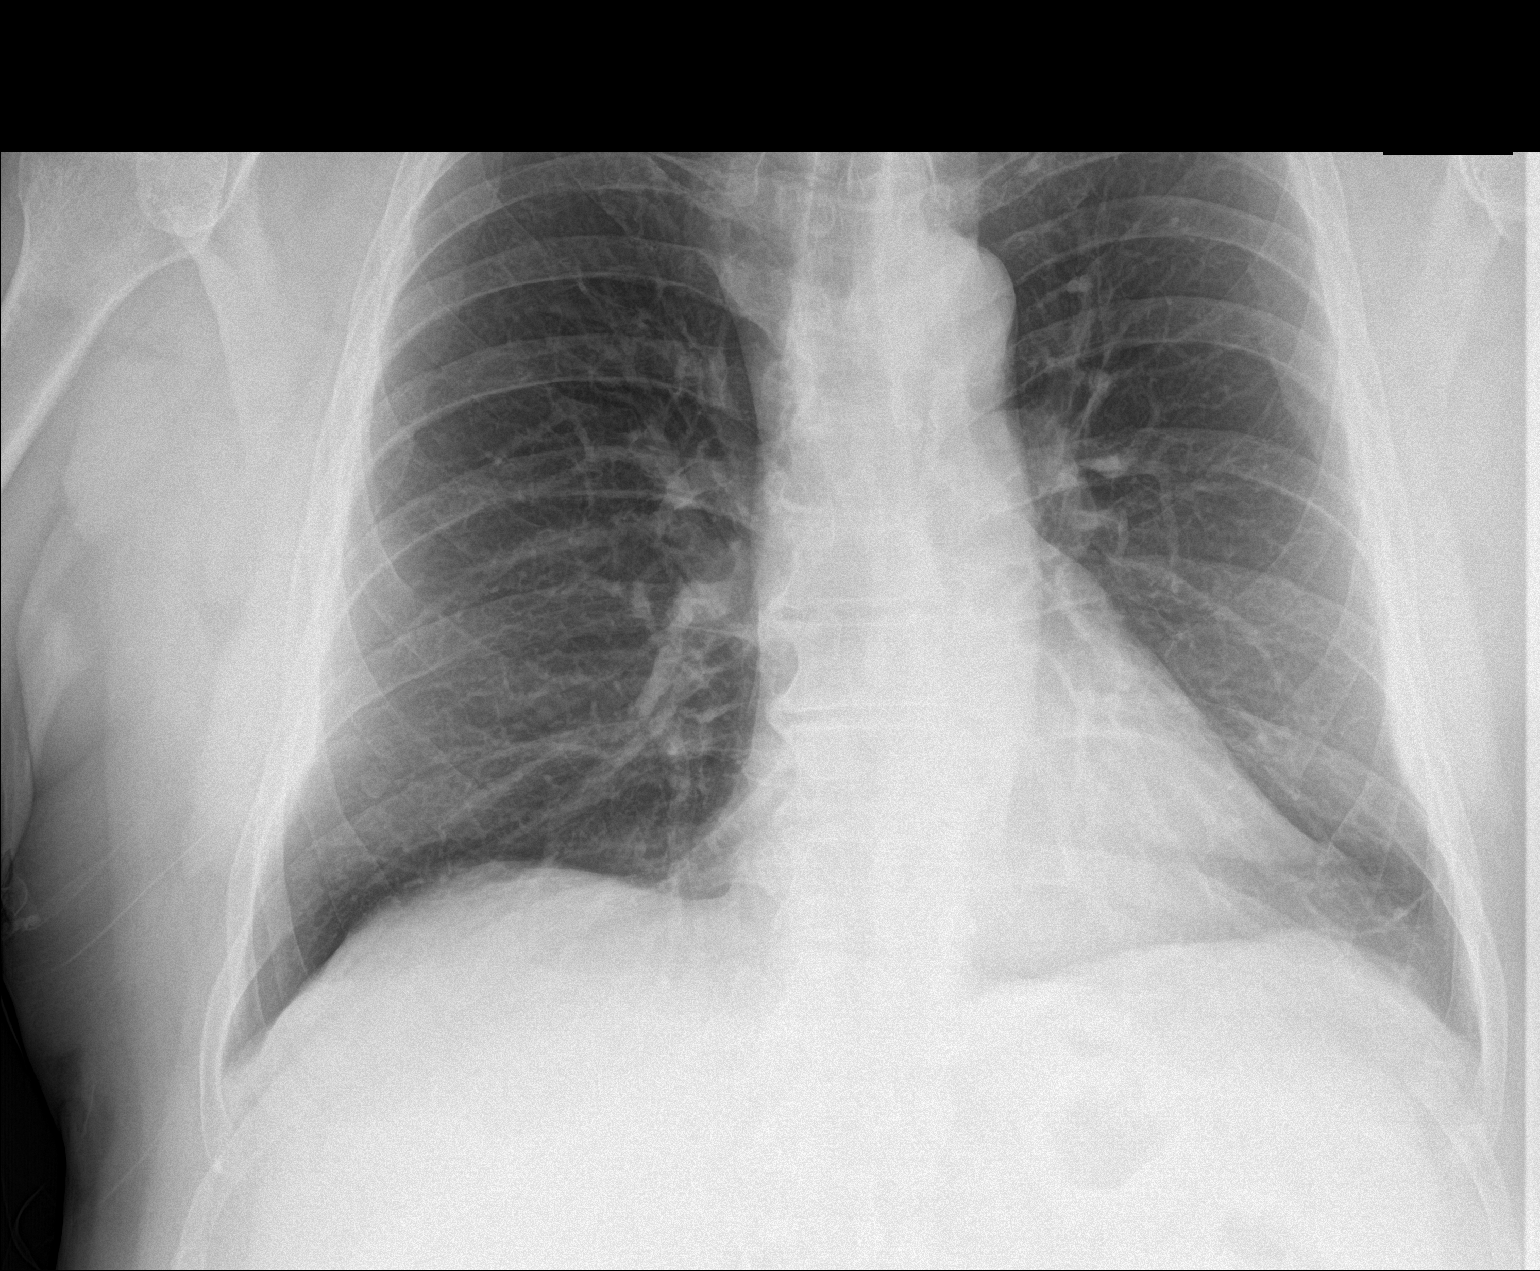

[3 of 3 positions shown; findings below may reference images not displayed]

FINDINGS: The heart size and mediastinal contours are within normal limits.
Both lungs are clear. The visualized skeletal structures are
unremarkable.
IMPRESSION: No active cardiopulmonary disease.

## 2017-11-08 ENCOUNTER — Other Ambulatory Visit: Payer: Self-pay | Admitting: Unknown Physician Specialty

## 2017-11-08 DIAGNOSIS — R221 Localized swelling, mass and lump, neck: Secondary | ICD-10-CM

## 2017-11-15 ENCOUNTER — Ambulatory Visit
Admission: RE | Admit: 2017-11-15 | Discharge: 2017-11-15 | Disposition: A | Discharge status: Home / Self Care | Payer: BLUE CROSS/BLUE SHIELD | Source: Ambulatory Visit | Attending: Unknown Physician Specialty | Admitting: Unknown Physician Specialty

## 2017-11-15 DIAGNOSIS — R221 Localized swelling, mass and lump, neck: Secondary | ICD-10-CM

## 2017-11-15 DIAGNOSIS — R590 Localized enlarged lymph nodes: Secondary | ICD-10-CM | POA: Diagnosis not present

## 2017-11-15 LAB — POCT I-STAT CREATININE: CREATININE: 1.2 mg/dL (ref 0.61–1.24)

## 2017-11-15 IMAGING — CT CT NECK W/ CM
2 of 3 series · 8 of 14 positions shown, 10 images · IV contrast (iopamidol)
Comparison: None.

CLINICAL DATA: Right-sided neck swelling over the last 2 weeks.

EXAM:
CT NECK WITH CONTRAST
TECHNIQUE: Multidetector CT imaging of the neck was performed using the
standard protocol following the bolus administration of intravenous
contrast.
CONTRAST:  75mL [40] IOPAMIDOL ([40]) INJECTION 61%

[Series 2: axial neck · axial · 0.63mm/px · z∈[-255,-127]mm · 3 of 129 slices shown]
[im 33/129  bone]
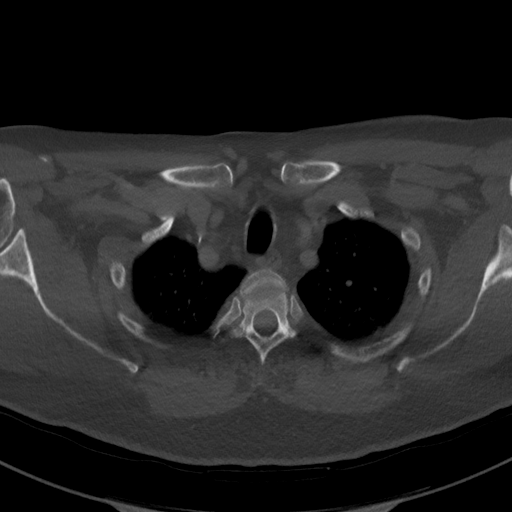
[im 65/129  bone]
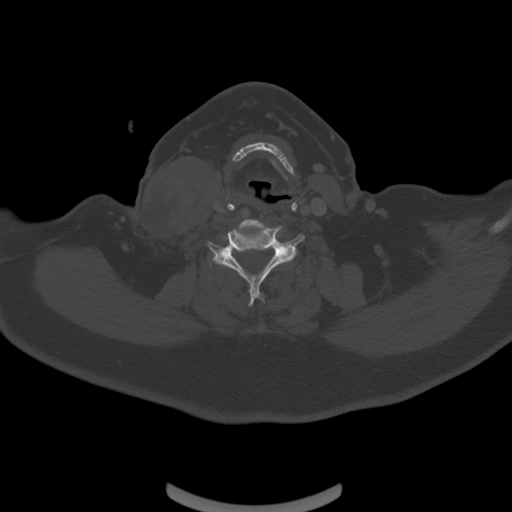
[im 97/129  bone]
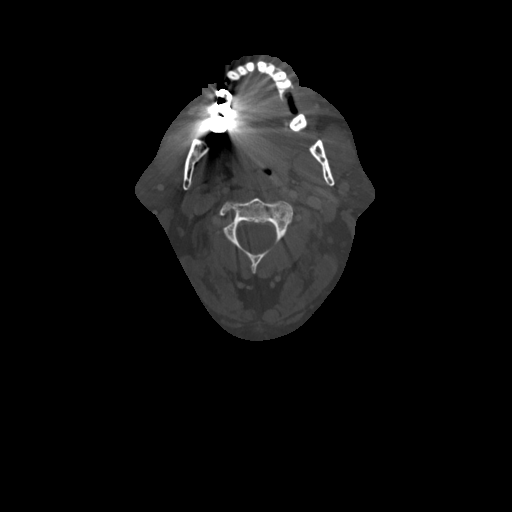

[Series 8: orthogonal ax · axial · 0.50mm/px · z∈[-349,-127]mm · 5 of 177 slices shown, 7 images]
[im 30/177  soft-tissue]
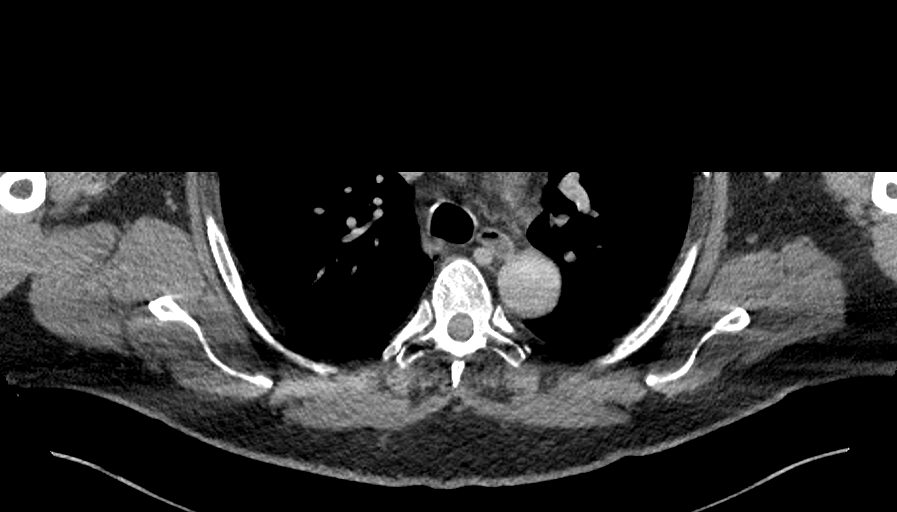
[im 30/177  bone]
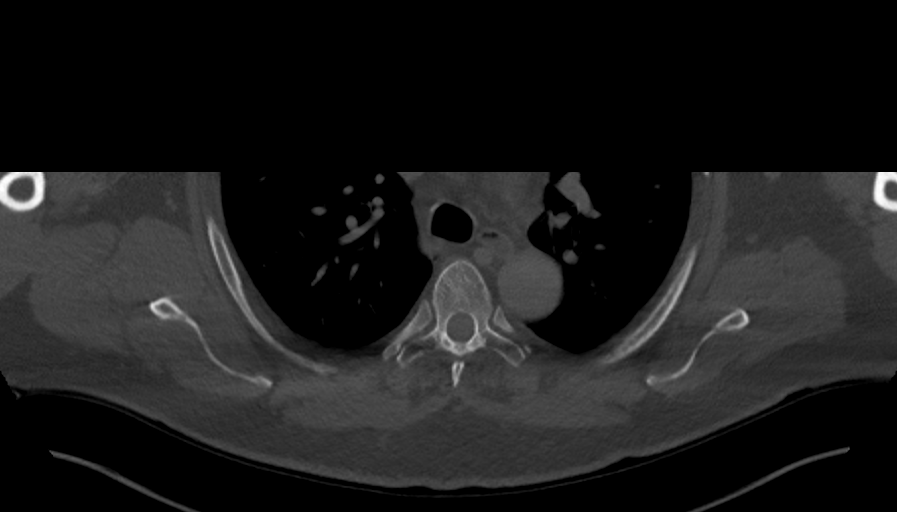
[im 59/177  bone]
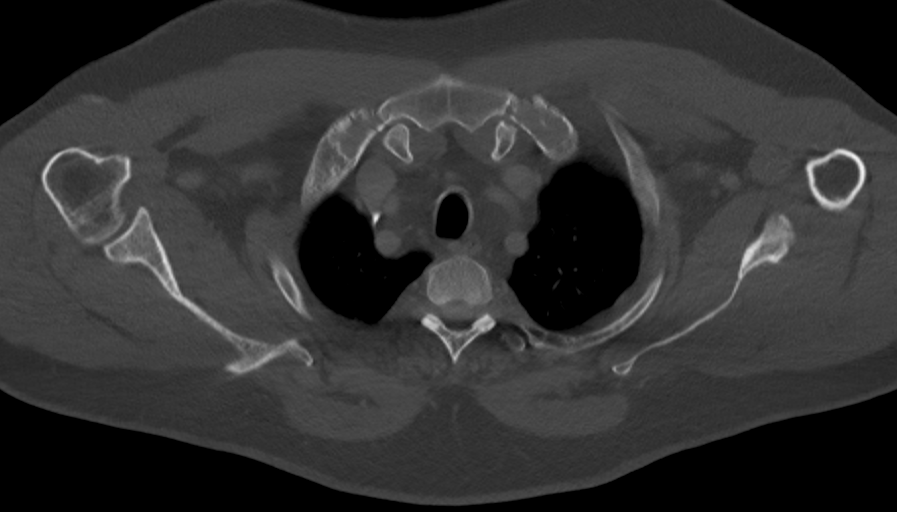
[im 89/177  bone]
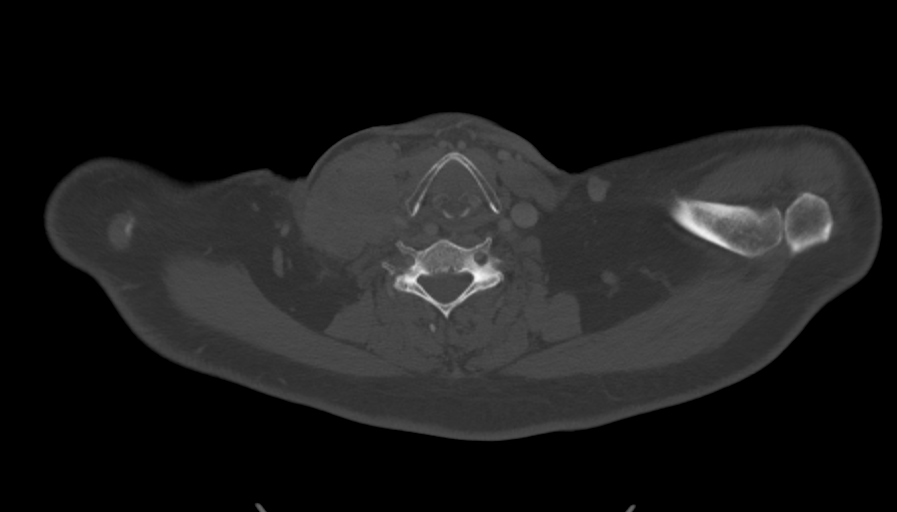
[im 118/177  bone]
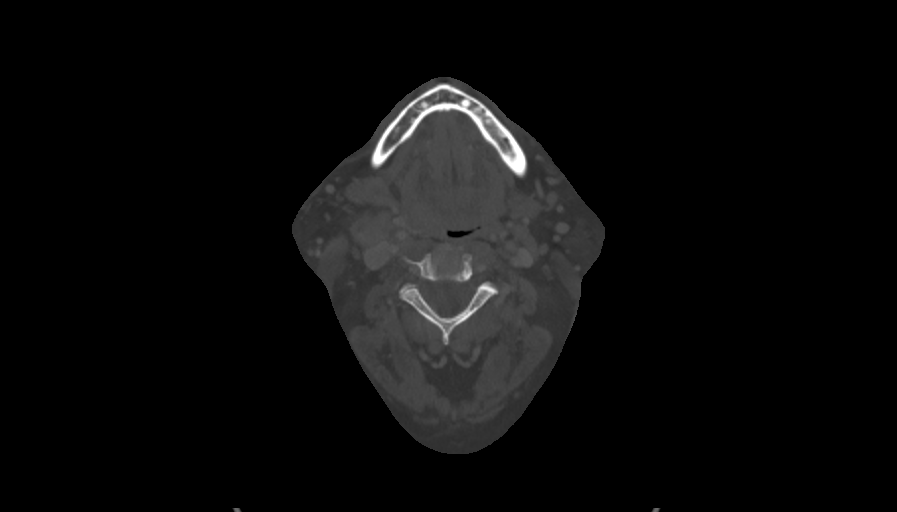
[im 147/177  soft-tissue]
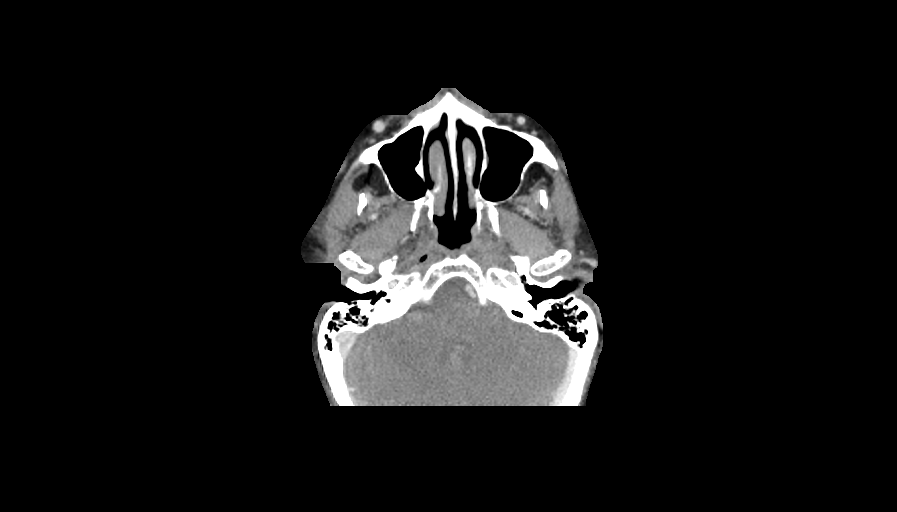
[im 147/177  bone]
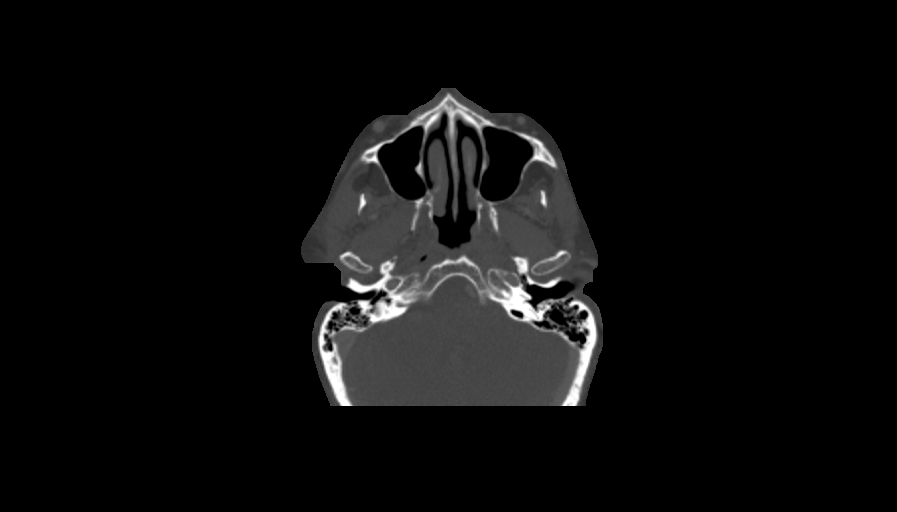

[8 of 14 positions shown; findings below may reference images not displayed]

FINDINGS: Pharynx and larynx: I think there is slight prominence of tissue in
the right tonsillar region compared to the left, but detail is
limited by I streak artifact from dental work. No other mucosal or
submucosal finding.

Salivary glands: Submandibular and parotid glands are normal.

Thyroid: Normal

Lymph nodes: Left neck nodes are normal. There is a necrotic right
level 2 node with short axis diameter of 17 mm extending over a
length of 2.5 cm. There is a large partially necrotic nodal mass
spanning level 3, 4 and 5 measuring 5.2 x 5.5 x 3.7 cm. This cannot
be separated from the sternocleidomastoid muscle. There is
mass-effect upon the right jugular vein but the jugular vein does
appear to show some flow.

Vascular: Mass-effect upon the right jugular vein as noted above.
Patent flow does persist presently however.

Limited intracranial: Normal

Visualized orbits: Normal

Mastoids and visualized paranasal sinuses: Normal

Skeleton: Normal

Upper chest: No evidence of metastatic disease. No significant chest
finding.

Other: None
IMPRESSION: Pronounced lymphadenopathy in the right neck including a
conglomerate partially necrotic nodal mass spanning level 3, 4 and
5, measuring 5.2 x 5.5 x 3.7 cm. Additional pathologic node right
level 2. Suspicion of right tonsillar primary with some asymmetry in
that region, though detail is limited by dental streak artifact.

## 2017-11-15 MED ORDER — IOPAMIDOL (ISOVUE-300) INJECTION 61%
75.0000 mL | Freq: Once | INTRAVENOUS | Status: AC | PRN
  Administered 2017-11-15: 75 mL via INTRAVENOUS

## 2017-11-19 ENCOUNTER — Other Ambulatory Visit: Payer: Self-pay | Admitting: Unknown Physician Specialty

## 2017-11-19 DIAGNOSIS — R221 Localized swelling, mass and lump, neck: Secondary | ICD-10-CM

## 2017-11-28 ENCOUNTER — Other Ambulatory Visit: Payer: Self-pay | Admitting: Radiology

## 2017-11-29 ENCOUNTER — Ambulatory Visit
Admission: RE | Admit: 2017-11-29 | Discharge: 2017-11-29 | Disposition: A | Discharge status: Home / Self Care | Payer: BLUE CROSS/BLUE SHIELD | Source: Ambulatory Visit | Attending: Unknown Physician Specialty | Admitting: Unknown Physician Specialty

## 2017-11-29 DIAGNOSIS — R221 Localized swelling, mass and lump, neck: Secondary | ICD-10-CM | POA: Diagnosis present

## 2017-11-29 DIAGNOSIS — C490 Malignant neoplasm of connective and soft tissue of head, face and neck: Secondary | ICD-10-CM | POA: Diagnosis not present

## 2017-11-29 HISTORY — DX: Unspecified osteoarthritis, unspecified site: M19.90

## 2017-11-29 LAB — CBC
HCT: 45.5 % (ref 40.0–52.0)
HEMOGLOBIN: 15.3 g/dL (ref 13.0–18.0)
MCH: 29.7 pg (ref 26.0–34.0)
MCHC: 33.6 g/dL (ref 32.0–36.0)
MCV: 88.4 fL (ref 80.0–100.0)
Platelets: 262 10*3/uL (ref 150–440)
RBC: 5.15 MIL/uL (ref 4.40–5.90)
RDW: 13.6 % (ref 11.5–14.5)
WBC: 7.8 10*3/uL (ref 3.8–10.6)

## 2017-11-29 LAB — PROTIME-INR
INR: 0.87
PROTHROMBIN TIME: 11.7 s (ref 11.4–15.2)

## 2017-11-29 LAB — APTT: aPTT: 28 seconds (ref 24–36)

## 2017-11-29 MED ORDER — MIDAZOLAM HCL 5 MG/5ML IJ SOLN
INTRAMUSCULAR | Status: AC
  Filled 2017-11-29: qty 5

## 2017-11-29 MED ORDER — MIDAZOLAM HCL 5 MG/5ML IJ SOLN
INTRAMUSCULAR | Status: AC | PRN
  Administered 2017-11-29: 0.5 mg via INTRAVENOUS
  Administered 2017-11-29: 1 mg via INTRAVENOUS
  Administered 2017-11-29: 0.5 mg via INTRAVENOUS
  Administered 2017-11-29: 1 mg via INTRAVENOUS

## 2017-11-29 MED ORDER — FENTANYL CITRATE (PF) 100 MCG/2ML IJ SOLN
INTRAMUSCULAR | Status: AC
  Filled 2017-11-29: qty 4

## 2017-11-29 MED ORDER — FENTANYL CITRATE (PF) 100 MCG/2ML IJ SOLN
INTRAMUSCULAR | Status: AC | PRN
  Administered 2017-11-29: 25 ug via INTRAVENOUS
  Administered 2017-11-29: 50 ug via INTRAVENOUS

## 2017-11-29 MED ORDER — SODIUM CHLORIDE 0.9 % IV SOLN: INTRAVENOUS | Status: DC

## 2017-11-29 NOTE — H&P (Signed)
Chief Complaint: Patient was seen in consultation today for No chief complaint on file.  at the request of [place]  Referring Physician(s): McQueen,Chapman  Supervising Physician: Marybelle Killings  Patient Status: [facility] - Out-pt  History of Present Illness: Ivan Garcia. is a 61 y.o. male with an enlarging right neck mass over the last month. He has had a recent history of right sided abscessed molars. He denies wight loss.  Past Medical History:  Diagnosis Date  . Arthritis   . Hemorrhoids   . Hyperlipidemia   . Hypertension     Past Surgical History:  Procedure Laterality Date  . COLONOSCOPY WITH PROPOFOL N/A 04/04/2017   Procedure: COLONOSCOPY WITH PROPOFOL;  Surgeon: Robert Bellow, MD;  Location: [facility] ENDOSCOPY;  Service: Endoscopy;  Laterality: N/A;  . TONSILLECTOMY      Allergies: Patient has no known allergies.  Medications: Prior to Admission medications   Medication Sig Start Date End Date Taking? Authorizing Provider  aspirin EC 81 MG tablet Take 81 mg by mouth daily.   Yes [provider]  Biotin 10000 MCG TABS Take by mouth.   Yes [provider]  irbesartan (AVAPRO) 150 MG tablet Take 150 mg by mouth daily. 02/09/17  Yes [provider]  Multiple Vitamins-Minerals (MULTIVITAMIN WITH MINERALS) tablet Take 1 tablet by mouth daily.   Yes [provider]  rosuvastatin (CRESTOR) 10 MG tablet Take 10 mg by mouth every evening. 01/26/17  Yes [provider]  zinc gluconate 50 MG tablet Take 50 mg by mouth daily.   Yes [provider]  niacin 500 MG tablet Take 500 mg by mouth at bedtime.    [provider]     Family History  Problem Relation Age of Onset  . Breast cancer Mother   . Prostate cancer Father   . Brain cancer Father     Social History   Socioeconomic History  . Marital status: Single    Spouse name: None  . Number of children: None  . Years of education: None    . Highest education level: None  Social Needs  . Financial resource strain: None  . Food insecurity - worry: None  . Food insecurity - inability: None  . Transportation needs - medical: None  . Transportation needs - non-medical: None  Occupational History  . None  Tobacco Use  . Smoking status: Never Smoker  . Smokeless tobacco: Never Used  Substance and Sexual Activity  . Alcohol use: No  . Drug use: No  . Sexual activity: None  Other Topics Concern  . None  Social History Narrative  . None      Review of Systems: A 12 point ROS discussed and pertinent positives are indicated in the HPI above.  All other systems are negative.  Review of Systems  Vital Signs: BP (!) 154/99   Pulse 97   Temp 98.2 F (36.8 C) (Oral)   Resp (!) 22   Ht 5\' 8"  (1.727 m)   Wt 222 lb (100.7 kg)   SpO2 99%   BMI 33.75 kg/m   Physical Exam  Constitutional: He is oriented to person, place, and time. He appears well-developed and well-nourished.  Cardiovascular: Normal rate and regular rhythm.  Pulmonary/Chest: Effort normal and breath sounds normal.  Neurological: He is alert and oriented to person, place, and time.    Imaging: Dg Chest 2 View  Result Date: 11/07/2017 CLINICAL DATA:  Right neck mass. EXAM: CHEST  2  VIEW COMPARISON:  None. FINDINGS: The heart size and mediastinal contours are within normal limits. Both lungs are clear. The visualized skeletal structures are unremarkable. IMPRESSION: No active cardiopulmonary disease. Electronically Signed   By: Marijo Conception, M.D.   On: 11/07/2017 15:41   Ct Soft Tissue Neck W Contrast  Result Date: 11/15/2017 CLINICAL DATA:  Right-sided neck swelling over the last 2 weeks. EXAM: CT NECK WITH CONTRAST TECHNIQUE: Multidetector CT imaging of the neck was performed using the standard protocol following the bolus administration of intravenous contrast. CONTRAST:  75mL ISOVUE-300 IOPAMIDOL (ISOVUE-300) INJECTION 61% COMPARISON:  None.  FINDINGS: Pharynx and larynx: I think there is slight prominence of tissue in the right tonsillar region compared to the left, but detail is limited by I streak artifact from dental work. No other mucosal or submucosal finding. Salivary glands: Submandibular and parotid glands are normal. Thyroid: Normal Lymph nodes: Left neck nodes are normal. There is a necrotic right level 2 node with short axis diameter of 17 mm extending over a length of 2.5 cm. There is a large partially necrotic nodal mass spanning level 3, 4 and 5 measuring 5.2 x 5.5 x 3.7 cm. This cannot be separated from the sternocleidomastoid muscle. There is mass-effect upon the right jugular vein but the jugular vein does appear to show some flow. Vascular: Mass-effect upon the right jugular vein as noted above. Patent flow does persist presently however. Limited intracranial: Normal Visualized orbits: Normal Mastoids and visualized paranasal sinuses: Normal Skeleton: Normal Upper chest: No evidence of metastatic disease. No significant chest finding. Other: None IMPRESSION: Pronounced lymphadenopathy in the right neck including a conglomerate partially necrotic nodal mass spanning level 3, 4 and 5, measuring 5.2 x 5.5 x 3.7 cm. Additional pathologic node right level 2. Suspicion of right tonsillar primary with some asymmetry in that region, though detail is limited by dental streak artifact. Electronically Signed   By: Nelson Chimes M.D.   On: 11/15/2017 15:04    Labs:  CBC: Recent Labs    11/29/17 0857  WBC 7.8  HGB 15.3  HCT 45.5  PLT 262    COAGS: Recent Labs    11/29/17 0857  INR 0.87  APTT 28    BMP: Recent Labs    11/15/17 1437  CREATININE 1.20    LIVER FUNCTION TESTS: No results for input(s): BILITOT, AST, ALT, ALKPHOS, PROT, ALBUMIN in the last 8760 hours.  TUMOR MARKERS: No results for input(s): AFPTM, CEA, CA199, CHROMGRNA in the last 8760 hours.  Assessment and Plan:  Right neck mass. US guided biopsy to  follow.   Electronically Signed: Dakari Cregger, ART A, MD 11/29/2017, 9:43 AM   I spent a total of  40 Minutes   in face to face in clinical consultation, greater than 50% of which was counseling/coordinating care for neck mass biopsy.

## 2017-11-29 NOTE — Progress Notes (Signed)
Patient remains clinically stable post procedure. Dressing to right neck clean/dry and intact,discharge instructions given with questions answered.family member present. Taking po's without difficulty.

## 2017-11-29 NOTE — Procedures (Signed)
R neck LN Bx 18 g core times 6 EBL 0 Comp 0

## 2017-12-03 ENCOUNTER — Other Ambulatory Visit: Payer: Self-pay | Admitting: Pathology

## 2017-12-03 LAB — SURGICAL PATHOLOGY

## 2017-12-06 ENCOUNTER — Other Ambulatory Visit: Payer: Self-pay | Admitting: Unknown Physician Specialty

## 2017-12-06 DIAGNOSIS — C4442 Squamous cell carcinoma of skin of scalp and neck: Secondary | ICD-10-CM

## 2017-12-10 DIAGNOSIS — Z7189 Other specified counseling: Secondary | ICD-10-CM | POA: Insufficient documentation

## 2017-12-10 NOTE — Progress Notes (Signed)
Hematology/Oncology Consult note [facility] [phone] [phone]   Patient Care Team: Albina Billet, MD as PCP - General (Internal Medicine) Albina Billet, MD (Internal Medicine) Bary Castilla Forest Gleason, MD (General Surgery)  REFERRING PROVIDER: Dr.McQueen CHIEF COMPLAINTS/PURPOSE OF CONSULTATION:  Evaluation of newly diagnosed head and neck cancer.  HISTORY OF PRESENTING ILLNESS:  Ivan Garcia. is a  62 y.o.  male with PMH listed below who was referred to me for evaluation of head and neck cancer. Patient recently found right neck mass which prompted him to see primary care physician Dr. Hall Busing. Dr. Hall Busing refer patient to be evaluated by ENT Dr. Tami Ribas. With ENT examination, he was found to have midline tongue base pedunculated mass, 3cm.  And had CT soft tissue neck which confirmed pronounced lymphadenopathy in the right neck including conglomerate partially necrotic nodal mass spanning level III, 4 and 5, measuring 5.2 x 5.5 x 3.7 cm. Additional pathological nodes and right level 2 about 2.5 cm. There is asymmetry in the right tonsillar region. 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 Patient reports feeling at baseline. He had right neck discomfort, soreness for which he takes ibuprofen as needed. Denies any swallowing difficulty, shortness of breath, abdominal pain cough or lower extremity swelling. Review of Systems  Constitutional: Negative for chills and fever.  HENT: Negative for hearing loss and tinnitus.        Right neck mass soreness  Eyes: Negative for blurred vision.  Respiratory: Negative for cough.   Cardiovascular: Negative for chest pain.  Gastrointestinal: Negative for heartburn.  Genitourinary: Negative for dysuria.  Musculoskeletal: Negative for myalgias.  Skin: Negative for rash.  Neurological: Negative for dizziness.  Endo/Heme/Allergies: Does not  bruise/bleed easily.  Psychiatric/Behavioral: Negative for depression.    MEDICAL HISTORY:  Past Medical History:  Diagnosis Date  . Arthritis   . Hemorrhoids   . Hyperlipidemia   . Hypertension     SURGICAL HISTORY: Past Surgical History:  Procedure Laterality Date  . COLONOSCOPY WITH PROPOFOL N/A 04/04/2017   Procedure: COLONOSCOPY WITH PROPOFOL;  Surgeon: Robert Bellow, MD;  Location: [facility] ENDOSCOPY;  Service: Endoscopy;  Laterality: N/A;  . TONSILLECTOMY      SOCIAL HISTORY: Social History   Socioeconomic History  . Marital status: Single    Spouse name: Not on file  . Number of children: Not on file  . Years of education: Not on file  . Highest education level: Not on file  Social Needs  . Financial resource strain: Not on file  . Food insecurity - worry: Not on file  . Food insecurity - inability: Not on file  . Transportation needs - medical: Not on file  . Transportation needs - non-medical: Not on file  Occupational History  . Not on file  Tobacco Use  . Smoking status: Never Smoker  . Smokeless tobacco: Never Used  Substance and Sexual Activity  . Alcohol use: No  . Drug use: No  . Sexual activity: Not on file  Other Topics Concern  . Not on file  Social History Narrative  . Not on file    FAMILY HISTORY: Family History  Problem Relation Age of Onset  . Breast cancer Mother   . Prostate cancer Father   . Brain cancer Father     ALLERGIES:  has No Known Allergies.  MEDICATIONS:  Current Outpatient Medications  Medication Sig Dispense Refill  . aspirin EC  81 MG tablet Take 81 mg by mouth daily.    . Biotin 10000 MCG TABS Take by mouth.    . irbesartan (AVAPRO) 150 MG tablet Take 150 mg by mouth daily.  2  . Multiple Vitamins-Minerals (MULTIVITAMIN WITH MINERALS) tablet Take 1 tablet by mouth daily.    . niacin 500 MG tablet Take 500 mg by mouth at bedtime.    . rosuvastatin (CRESTOR) 10 MG tablet Take 10 mg by mouth every evening.  4  .  zinc gluconate 50 MG tablet Take 50 mg by mouth daily.     No current facility-administered medications for this visit.      PHYSICAL EXAMINATION: ECOG PERFORMANCE STATUS: 0 - Asymptomatic Vitals:   12/11/17 1009  BP: (!) 143/96  Pulse: 84   Filed Weights   12/11/17 1009  Weight: 226 lb (102.5 kg)    Physical Exam  Constitutional: He is oriented to person, place, and time and well-developed, well-nourished, and in no distress. No distress.  HENT:  Head: Normocephalic and atraumatic.  Eyes: Conjunctivae and EOM are normal. Pupils are equal, round, and reactive to light.  Neck: Normal range of motion. Neck supple.  Right side neck mass  Cardiovascular: Normal rate and regular rhythm.  No murmur heard. Pulmonary/Chest: Breath sounds normal. He exhibits no tenderness.  Abdominal: Soft. Bowel sounds are normal. He exhibits no distension.  Musculoskeletal: Normal range of motion. He exhibits no edema.  Lymphadenopathy:    He has no cervical adenopathy.  Neurological: He is oriented to person, place, and time. Gait normal.  Skin: Skin is dry.  Psychiatric: Affect normal.     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 Neoplasm related pain   Pathology results was discussed with patient. CT showed ipsilateral nodal involvement Clinically he has cT2 cN1 disease. We'll obtain PET scan to complete staging.  If he does not have distant metastasis, will recommend concurrent cisplatin contained regimen chemotherapy with radiation.  # refer to vascular surgeon for medi port placement.  # check audiogram # check CBC and CMP.  # Advise patient to avoid NSAIDS due  to potential renal toxicity. Prescribed Tylenol #3 for relieving neck pain.  # chemotherapy class.  Will discuss with him after PET to finalize his treatment plan.  All questions were answered. The patient knows to call the clinic with any problems questions or concerns.  Return of visit: after PET scan.  Thank you for this kind referral and the opportunity to participate in the care of this patient. A copy of today's note is routed to referring provider Dr.McQueen.    Earlie Server, MD, PhD Hematology Oncology [facility] at [facility] Pager- [phone] 12/11/2017

## 2017-12-11 ENCOUNTER — Ambulatory Visit
Admission: RE | Admit: 2017-12-11 | Discharge: 2017-12-11 | Disposition: A | Discharge status: Home / Self Care | Payer: BLUE CROSS/BLUE SHIELD | Source: Ambulatory Visit | Attending: Radiation Oncology | Admitting: Radiation Oncology

## 2017-12-11 ENCOUNTER — Inpatient Hospital Stay: Payer: BLUE CROSS/BLUE SHIELD

## 2017-12-11 ENCOUNTER — Inpatient Hospital Stay: Payer: BLUE CROSS/BLUE SHIELD | Attending: Oncology | Admitting: Oncology

## 2017-12-11 ENCOUNTER — Encounter: Payer: Self-pay | Admitting: Oncology

## 2017-12-11 ENCOUNTER — Other Ambulatory Visit: Payer: Self-pay

## 2017-12-11 VITALS — BP 143/96 | HR 84 | Ht 69.0 in | Wt 226.0 lb

## 2017-12-11 DIAGNOSIS — M542 Cervicalgia: Secondary | ICD-10-CM | POA: Insufficient documentation

## 2017-12-11 DIAGNOSIS — E785 Hyperlipidemia, unspecified: Secondary | ICD-10-CM | POA: Diagnosis not present

## 2017-12-11 DIAGNOSIS — Z808 Family history of malignant neoplasm of other organs or systems: Secondary | ICD-10-CM | POA: Insufficient documentation

## 2017-12-11 DIAGNOSIS — I10 Essential (primary) hypertension: Secondary | ICD-10-CM | POA: Diagnosis not present

## 2017-12-11 DIAGNOSIS — K649 Unspecified hemorrhoids: Secondary | ICD-10-CM | POA: Insufficient documentation

## 2017-12-11 DIAGNOSIS — C109 Malignant neoplasm of oropharynx, unspecified: Secondary | ICD-10-CM | POA: Insufficient documentation

## 2017-12-11 DIAGNOSIS — C760 Malignant neoplasm of head, face and neck: Secondary | ICD-10-CM

## 2017-12-11 DIAGNOSIS — N179 Acute kidney failure, unspecified: Secondary | ICD-10-CM | POA: Insufficient documentation

## 2017-12-11 DIAGNOSIS — Z8052 Family history of malignant neoplasm of bladder: Secondary | ICD-10-CM | POA: Diagnosis not present

## 2017-12-11 DIAGNOSIS — R221 Localized swelling, mass and lump, neck: Secondary | ICD-10-CM | POA: Diagnosis not present

## 2017-12-11 DIAGNOSIS — M129 Arthropathy, unspecified: Secondary | ICD-10-CM | POA: Diagnosis not present

## 2017-12-11 DIAGNOSIS — Z803 Family history of malignant neoplasm of breast: Secondary | ICD-10-CM

## 2017-12-11 DIAGNOSIS — Z79899 Other long term (current) drug therapy: Secondary | ICD-10-CM | POA: Diagnosis not present

## 2017-12-11 DIAGNOSIS — G893 Neoplasm related pain (acute) (chronic): Secondary | ICD-10-CM

## 2017-12-11 DIAGNOSIS — Z510 Encounter for antineoplastic radiation therapy: Secondary | ICD-10-CM | POA: Insufficient documentation

## 2017-12-11 DIAGNOSIS — Z8042 Family history of malignant neoplasm of prostate: Secondary | ICD-10-CM | POA: Insufficient documentation

## 2017-12-11 LAB — CBC WITH DIFFERENTIAL/PLATELET
BASOS PCT: 1 %
Basophils Absolute: 0.1 10*3/uL (ref 0–0.1)
EOS ABS: 0.1 10*3/uL (ref 0–0.7)
EOS PCT: 1 %
HCT: 46.3 % (ref 40.0–52.0)
Hemoglobin: 15.5 g/dL (ref 13.0–18.0)
LYMPHS ABS: 1.3 10*3/uL (ref 1.0–3.6)
Lymphocytes Relative: 13 %
MCH: 29.7 pg (ref 26.0–34.0)
MCHC: 33.6 g/dL (ref 32.0–36.0)
MCV: 88.5 fL (ref 80.0–100.0)
Monocytes Absolute: 0.6 10*3/uL (ref 0.2–1.0)
Monocytes Relative: 7 %
NEUTROS PCT: 78 %
Neutro Abs: 7.7 10*3/uL — ABNORMAL HIGH (ref 1.4–6.5)
PLATELETS: 262 10*3/uL (ref 150–440)
RBC: 5.23 MIL/uL (ref 4.40–5.90)
RDW: 13.3 % (ref 11.5–14.5)
WBC: 9.9 10*3/uL (ref 3.8–10.6)

## 2017-12-11 LAB — COMPREHENSIVE METABOLIC PANEL
ALK PHOS: 95 U/L (ref 38–126)
ALT: 32 U/L (ref 17–63)
AST: 25 U/L (ref 15–41)
Albumin: 4.4 g/dL (ref 3.5–5.0)
Anion gap: 11 (ref 5–15)
BUN: 26 mg/dL — AB (ref 6–20)
CALCIUM: 9.8 mg/dL (ref 8.9–10.3)
CHLORIDE: 102 mmol/L (ref 101–111)
CO2: 23 mmol/L (ref 22–32)
CREATININE: 1.13 mg/dL (ref 0.61–1.24)
Glucose, Bld: 104 mg/dL — ABNORMAL HIGH (ref 65–99)
Potassium: 4.8 mmol/L (ref 3.5–5.1)
Sodium: 136 mmol/L (ref 135–145)
Total Bilirubin: 1.1 mg/dL (ref 0.3–1.2)
Total Protein: 8.1 g/dL (ref 6.5–8.1)

## 2017-12-11 MED ORDER — ACETAMINOPHEN-CODEINE #3 300-30 MG PO TABS: 1.0000 | ORAL_TABLET | Freq: Four times a day (QID) | ORAL | 0 refills | Status: DC | PRN

## 2017-12-11 NOTE — Consult Note (Signed)
NEW PATIENT EVALUATION  Name: [facility].  MRN: [id number]  Date:   12/11/2017     DOB: Jan 29, 1956   This 62 y.o. 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  REFERRING PHYSICIAN: Albina Billet, MD  CHIEF COMPLAINT:  Chief Complaint  Patient presents with  . Cancer    Pt is here for initial consultation of head and neck cancer.      DIAGNOSIS: The encounter diagnosis was Head and neck cancer ([place]).   PREVIOUS INVESTIGATIONS:  CT scan reviewed Pathology reports reviewed Clinical notes reviewed  HPI: Patient is a pleasant 62 year old male who presented with a rapidly enlarging right lower neck mass. Mass is somewhat uncomfortable although not significantly painful. He was seen by ENT noticed to have a pedunculated mass at the base of tongue on examination. Also noted to have significant large right neck mass. CT Scan showed a large conglomerate necrotic mass at the level XXXIV and 5 measuring approximately 5.5 x 5.2 cm. There is also suspicion of right tonsillar primary with some asymmetry in that region. PET CT scan is been ordered and is pending. 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 Patient is been seen by medical oncology today and is now referred to radiation oncology for opinion. He's having very slight head and neck pain and no dysphagia.   PLANNED TREATMENT REGIMEN: Concurrent chemoradiation  PAST MEDICAL HISTORY:  has a past medical history of Arthritis, Hemorrhoids, Hyperlipidemia, and Hypertension.    PAST SURGICAL HISTORY:  Past Surgical History:  Procedure Laterality Date  . COLONOSCOPY WITH PROPOFOL N/A 04/04/2017   Procedure: COLONOSCOPY WITH PROPOFOL;  Surgeon: Robert Bellow, MD;  Location: [facility] ENDOSCOPY;   Service: Endoscopy;  Laterality: N/A;  . MANDIBLE SURGERY    . TONSILLECTOMY      FAMILY HISTORY: family history includes Asthma in his mother; Bladder Cancer in his father; Brain cancer in his father; Breast cancer in his mother; Congestive Heart Failure in his mother; Prostate cancer in his father.  SOCIAL HISTORY:  reports that  has never smoked. he has never used smokeless tobacco. He reports that he does not drink alcohol or use drugs.  ALLERGIES: Patient has no known allergies.  MEDICATIONS:  Current Outpatient Medications  Medication Sig Dispense Refill  . acetaminophen-codeine (TYLENOL #3) 300-30 MG tablet Take 1 tablet by mouth every 6 (six) hours as needed for moderate pain or severe pain. 60 tablet 0  . irbesartan (AVAPRO) 150 MG tablet Take 150 mg by mouth daily.  2  . Multiple Vitamins-Minerals (MULTIVITAMIN WITH MINERALS) tablet Take 1 tablet by mouth daily.    . rosuvastatin (CRESTOR) 10 MG tablet Take 10 mg by mouth every evening.  4   No current facility-administered medications for this encounter.     ECOG PERFORMANCE STATUS:  1 - Symptomatic but completely ambulatory  REVIEW OF SYSTEMS:  Patient denies any weight loss, fatigue, weakness, fever, chills or night sweats. Patient denies any loss of vision, blurred vision. Patient denies any ringing  of the ears or hearing loss. No irregular heartbeat. Patient denies heart murmur or history of fainting. Patient denies any chest pain or pain radiating to her upper extremities. Patient denies any shortness of breath, difficulty breathing at night, cough or hemoptysis.  Patient denies any swelling in the lower legs. Patient denies any nausea vomiting, vomiting of blood, or coffee ground material in the vomitus. Patient denies any stomach pain. Patient states has had normal bowel movements no significant constipation or diarrhea. Patient denies any dysuria, hematuria or significant nocturia. Patient denies any problems walking,  swelling in the joints or loss of balance. Patient denies any skin changes, loss of hair or loss of weight. Patient denies any excessive worrying or anxiety or significant depression. Patient denies any problems with insomnia. Patient denies excessive thirst, polyuria, polydipsia. Patient denies any swollen glands, patient denies easy bruising or easy bleeding. Patient denies any recent infections, allergies or URI. Patient "s visual fields have not changed significantly in recent time.    PHYSICAL EXAM: There were no vitals taken for this visit. Oral cavity shows no oral mucosal lesions identified. Tonsil regions within normal limits. Base of tongue is somewhat obscured by examination although upper airways clear vallecula within normal limits. He has significant nodal metastasis in his right neck extending into the supraclavicular fossa. Well-developed well-nourished patient in NAD. HEENT reveals PERLA, EOMI, discs not visualized.  Oral cavity is clear. No oral mucosal lesions are identified. Neck is clear without evidence of cervical or supraclavicular adenopathy. Lungs are clear to A&P. Cardiac examination is essentially unremarkable with regular rate and rhythm without murmur rub or thrill. Abdomen is benign with no organomegaly or masses noted. Motor sensory and DTR levels are equal and symmetric in the upper and lower extremities. Cranial nerves II through XII are grossly intact. Proprioception is intact. No peripheral adenopathy or edema is identified. No motor or sensory levels are noted. Crude visual fields are within normal range.  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 Patient may be a candidate for induction  chemotherapy based on the large nodal metastasis in his right neck and will discuss that with medical oncology. If induction chemotherapy is not planned will go ahead with concurrent chemoradiation. Would plan on delivering 7000 cGy using I MRT radiation therapy to the area of nodal metastasis as well as in the area which indicates primary head and neck primary. Risks and benefits of treatment including loss of taste xerostomia fatigue alteration of blood counts skin reaction dysphasia all were discussed in detail with the patient. I've tentatively set up and planned CT simulation for early next week. If he is having induction chemotherapy will postpone that pending completion of chemotherapy.There will be extra effort by both professional staff as well as technical staff to coordinate and manage concurrent chemoradiation and ensuing side effects during his treatments.  Patient seems to comprehend my treatment plan well.  I would like to take this opportunity to thank you for allowing me to participate in the care of your patient.Noreene Filbert, MD

## 2017-12-11 NOTE — Progress Notes (Signed)
Patient here today as a new patient  

## 2017-12-13 ENCOUNTER — Encounter
Admission: RE | Admit: 2017-12-13 | Discharge: 2017-12-13 | Disposition: A | Discharge status: Home / Self Care | Payer: BLUE CROSS/BLUE SHIELD | Source: Ambulatory Visit | Attending: Oncology | Admitting: Oncology

## 2017-12-13 ENCOUNTER — Ambulatory Visit: Payer: BLUE CROSS/BLUE SHIELD

## 2017-12-13 DIAGNOSIS — C760 Malignant neoplasm of head, face and neck: Secondary | ICD-10-CM | POA: Insufficient documentation

## 2017-12-13 LAB — GLUCOSE, CAPILLARY: Glucose-Capillary: 89 mg/dL (ref 65–99)

## 2017-12-13 IMAGING — CT NM PET TUM IMG INITIAL (PI) SKULL BASE T - THIGH
10 series · 24 of 25 positions shown · non-contrast
Comparison: Neck CT [DATE]

CLINICAL DATA: Initial treatment strategy for oropharyngeal
neoplasm..

EXAM:
NUCLEAR MEDICINE PET SKULL BASE TO THIGH
TECHNIQUE: 11.5 mCi F-18 FDG was injected intravenously. Full-ring PET imaging
was performed from the skull base to thigh after the radiotracer. CT
data was obtained and used for attenuation correction and anatomic
localization.
FASTING BLOOD GLUCOSE:  Value: 89 mg/dl

[Series 3: ct wb 5.0 b30f · axial · 5.0mm · 0.98mm/px · z∈[-1473,-489]mm · 3 of 329 slices shown]
[im 1/329]
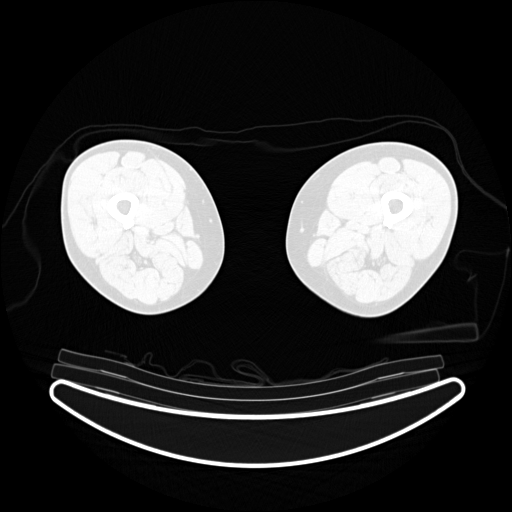
[im 165/329]
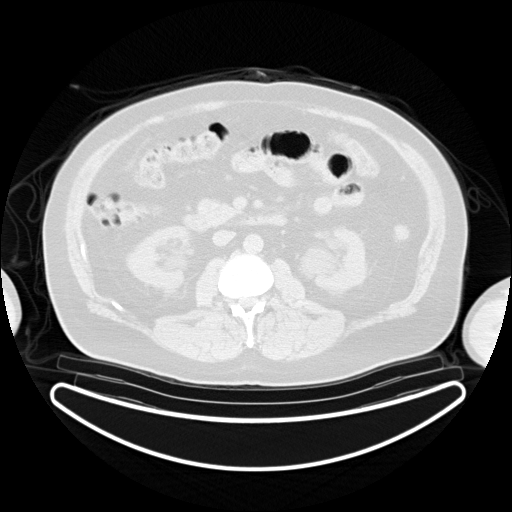
[im 329/329  brain]
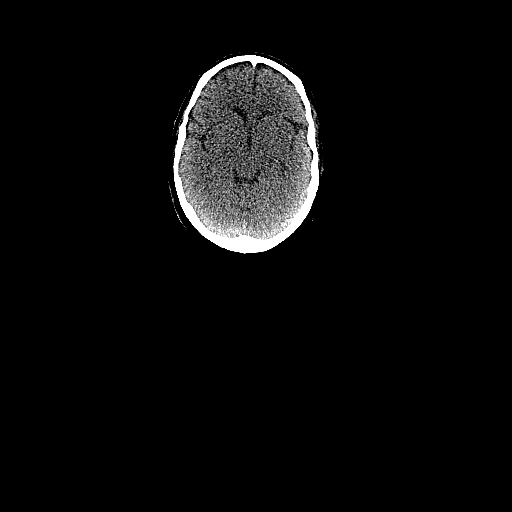

[Series 4: pet wb (ac) · axial · 5.0mm · 2.91mm/px · z∈[-1473,-489]mm · 3 of 329 slices shown]
[im 1/329]
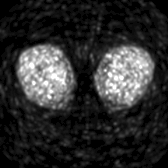
[im 165/329]
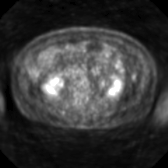
[im 329/329]
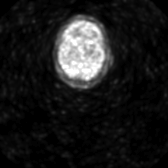

[Series 5: pet wb uncorrected (nac) · axial · 5.0mm · 4.07mm/px · z∈[-1473,-489]mm · 4 of 329 slices shown]
[im 1/329]
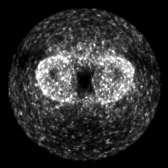
[im 110/329]
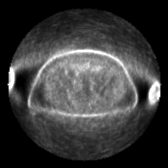
[im 219/329]
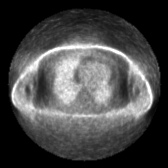
[im 329/329]
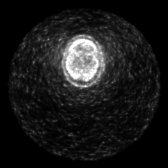

[Series 603: pet axial fused · 3 of 326 slices shown]
[im 1/326]
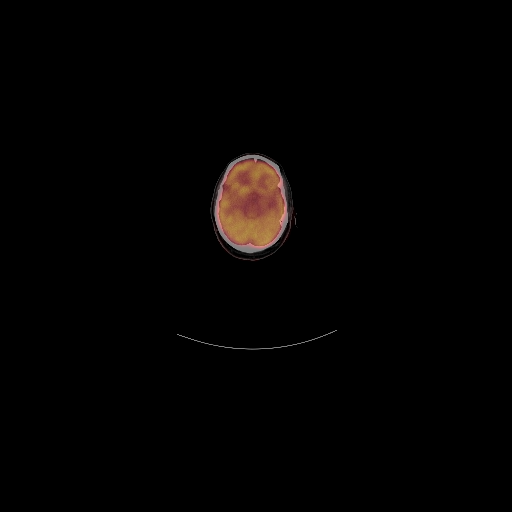
[im 109/326]
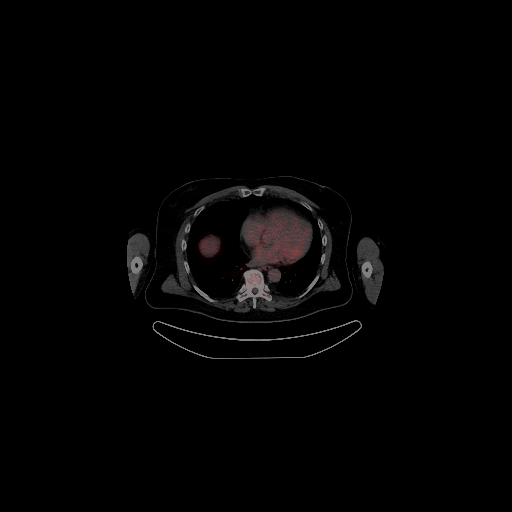
[im 326/326]
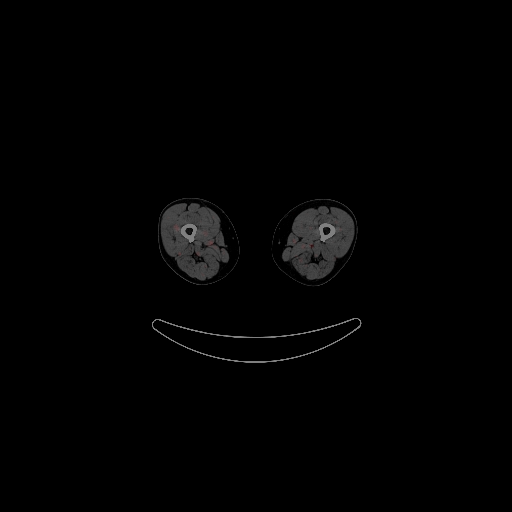

[Series 604: pet coronal fused · 1 of 107 slices shown]
[im 1/107]
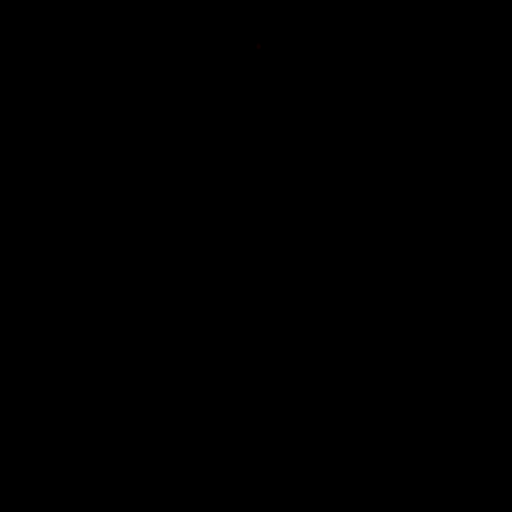

[Series 605: pet sagittal fused · 2 of 154 slices shown]
[im 1/154]
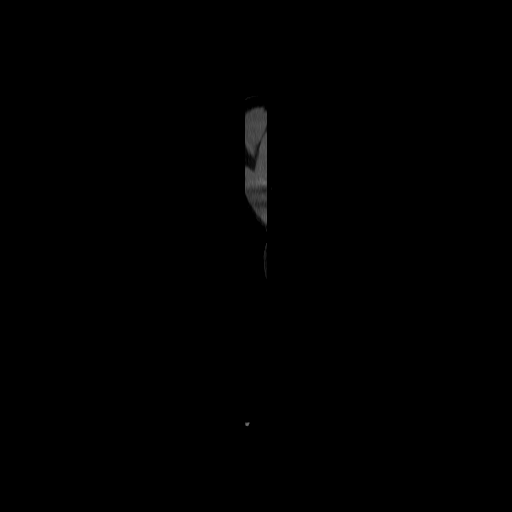
[im 154/154]
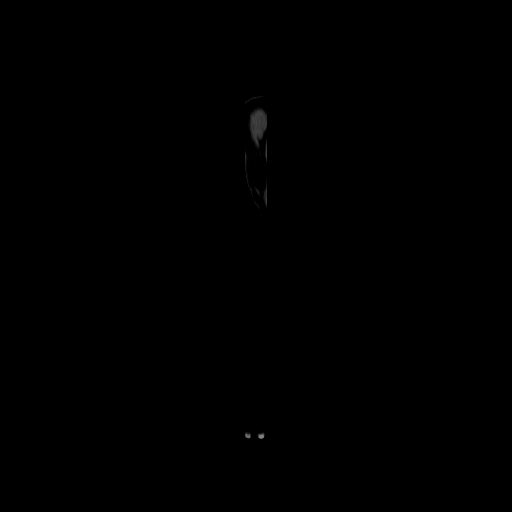

[Series 606: pet axial · 4 of 327 slices shown]
[im 1/327]
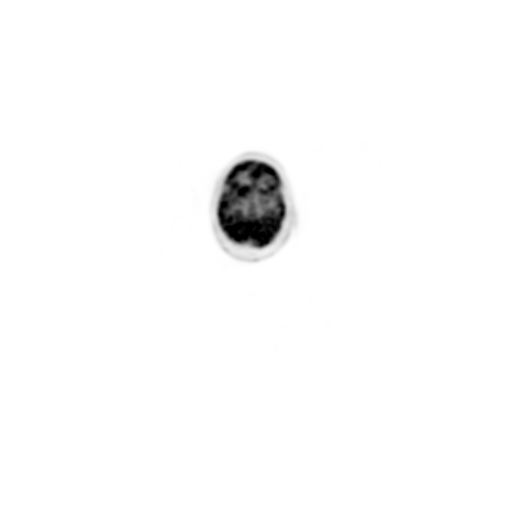
[im 109/327]
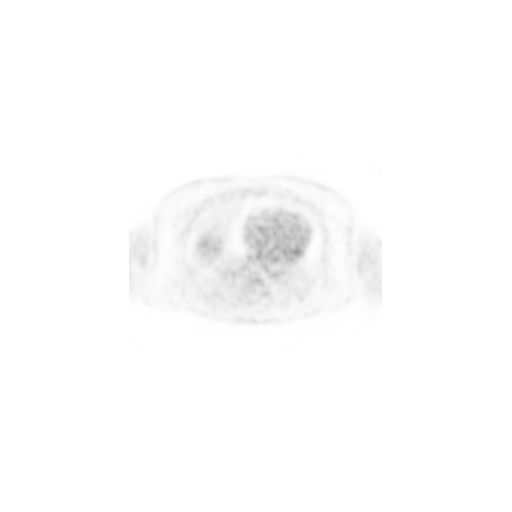
[im 218/327]
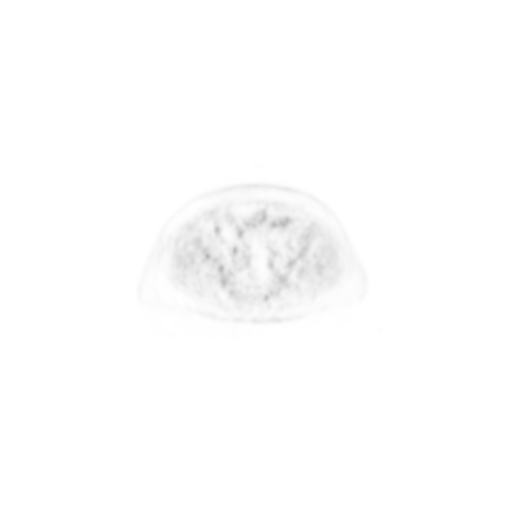
[im 327/327]
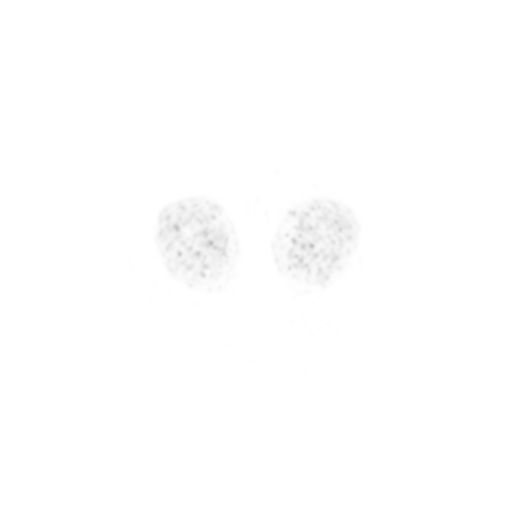

[Series 607: pet coronal · 1 of 123 slices shown]
[im 1/123]
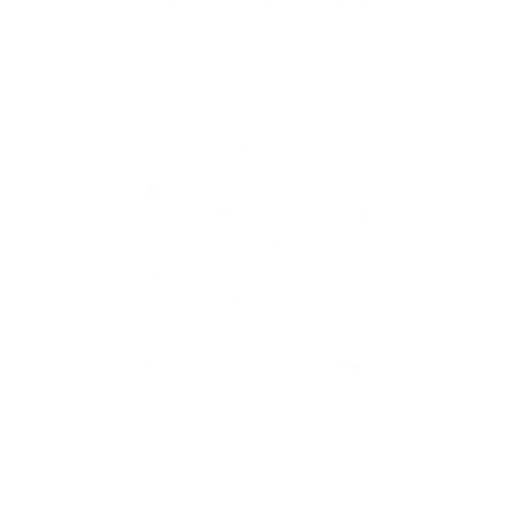

[Series 608: pet sagittal · 2 of 163 slices shown]
[im 1/163]
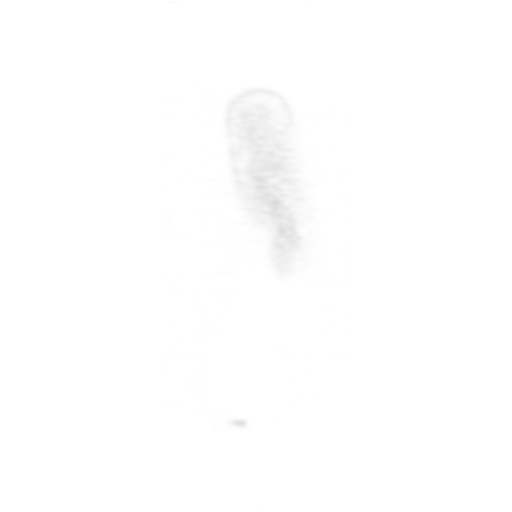
[im 163/163]
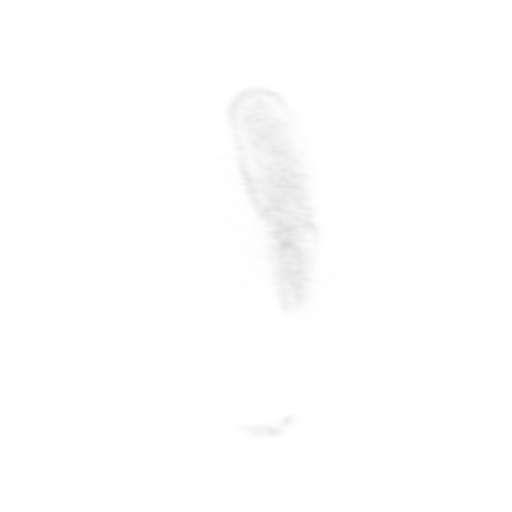

[Series 1025: results mm oncology reading · 0.89mm/px · 1 of 3 slices shown]
[im 1/3]
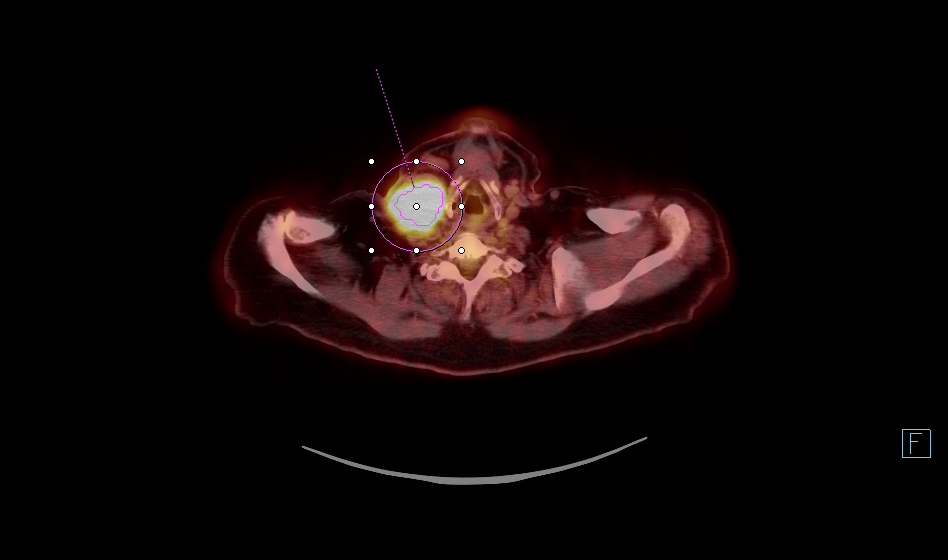

[24 of 25 positions shown; findings below may reference images not displayed]

FINDINGS: NECK: Marked hypermetabolism noted in the region of the
oropharynx/tounge base on the left side correlating with asymmetric
soft tissue density with mass effect. The SUV max is 14.15.
Associated right level 2 lymph node measuring 13.5 mm with SUV max
of

Large necrotic nodal mass spanning level 3, 4 and 5 on the right
side is markedly hypermetabolic with SUV max of 19.9. No left-sided
hypermetabolic lymphadenopathy.

CHEST: No hypermetabolic mediastinal or hilar nodes. No suspicious
pulmonary nodules on the CT scan.

ABDOMEN/PELVIS: No abnormal hypermetabolic activity within the
liver, pancreas, adrenal glands, or spleen. No hypermetabolic lymph
nodes in the abdomen or pelvis.

SKELETON: No focal hypermetabolic activity to suggest skeletal
metastasis.
IMPRESSION: 1. Primary hypermetabolic mucosal lesion in the left
oropharynx/tongue base with right-sided bulky multilevel
lymphadenopathy.
2. No findings for metastatic disease involving the chest, abdomen,
pelvis or bony structures.

## 2017-12-13 MED ORDER — FLUDEOXYGLUCOSE F - 18 (FDG) INJECTION
11.4600 | Freq: Once | INTRAVENOUS | Status: AC | PRN
  Administered 2017-12-13: 11.46 via INTRAVENOUS

## 2017-12-13 NOTE — Progress Notes (Signed)
Hematology/Oncology  Follow Up note [facility] [phone] [phone]) [phone]   Patient Care Team: Albina Billet, MD as PCP - General (Internal Medicine) Albina Billet, MD (Internal Medicine) Bary Castilla Forest Gleason, MD (General Surgery)  REFERRING PROVIDER: Dr.McQueen CHIEF COMPLAINTS/PURPOSE OF CONSULTATION:  Evaluation of newly diagnosed head and neck cancer.  HISTORY OF PRESENTING ILLNESS:  Ivan Garcia. is a  62 y.o.  male with PMH listed below who was referred to me for evaluation of head and neck cancer. Patient recently found right neck mass which prompted him to see primary care physician Dr. Hall Busing. Dr. Hall Busing refer patient to be evaluated by ENT Dr. Tami Ribas. With ENT examination, he was found to have midline tongue base pedunculated mass, 3cm.  And had CT soft tissue neck which confirmed pronounced lymphadenopathy in the right neck including conglomerate partially necrotic nodal mass spanning level III, 4 and 5, measuring 5.2 x 5.5 x 3.7 cm. Additional pathological nodes and right level 2 about 2.5 cm. There is asymmetry in the right tonsillar region. 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 Patient reports feeling at baseline. He had right neck discomfort, soreness for which he takes ibuprofen as needed. Denies any swallowing difficulty, shortness of breath, abdominal pain cough or lower extremity swelling.  INTERVAL HISTORY Ivan Garcia Ivan Garcia. is a 62 y.o. male who has above history reviewed by me today presents for follow up visit for management of newly diagnosed squamous carcinoma of oropharynx. Patient reports that he continued to have right neck pain despite taking Tylenol # 3. He doesn't have any swallowing difficulty. No other complaints.  Review of Systems  Constitutional: Negative for chills, fever and malaise/fatigue.  HENT: Negative for ear pain, hearing loss and  tinnitus.        Right neck mass soreness  Eyes: Negative for blurred vision and photophobia.  Respiratory: Negative for cough.   Cardiovascular: Negative for chest pain and orthopnea.  Gastrointestinal: Negative for heartburn, nausea and vomiting.  Genitourinary: Negative for dysuria and urgency.  Musculoskeletal: Negative for myalgias.  Skin: Negative for rash.  Neurological: Negative for dizziness.  Endo/Heme/Allergies: Does not bruise/bleed easily.  Psychiatric/Behavioral: Negative for depression and substance abuse.    MEDICAL HISTORY:  Past Medical History:  Diagnosis Date  . Arthritis   . Hemorrhoids   . Hyperlipidemia   . Hypertension     SURGICAL HISTORY: Past Surgical History:  Procedure Laterality Date  . COLONOSCOPY WITH PROPOFOL N/A 04/04/2017   Procedure: COLONOSCOPY WITH PROPOFOL;  Surgeon: Robert Bellow, MD;  Location: [facility] ENDOSCOPY;  Service: Endoscopy;  Laterality: N/A;  . MANDIBLE SURGERY    . TONSILLECTOMY      SOCIAL HISTORY: Social History   Socioeconomic History  . Marital status: Single    Spouse name: Not on file  . Number of children: Not on file  . Years of education: Not on file  . Highest education level: Not on file  Social Needs  . Financial resource strain: Not on file  . Food insecurity - worry: Not on file  . Food insecurity - inability: Not on file  . Transportation needs - medical: Not on file  . Transportation needs - non-medical: Not on file  Occupational History  . Not on file  Tobacco Use  . Smoking status: Never Smoker  . Smokeless tobacco: Never Used  Substance and Sexual Activity  . Alcohol use: No  . Drug  use: No  . Sexual activity: Not on file  Other Topics Concern  . Not on file  Social History Narrative  . Not on file    FAMILY HISTORY: Family History  Problem Relation Age of Onset  . Breast cancer Mother   . Asthma Mother   . Congestive Heart Failure Mother   . Prostate cancer Father   . Brain  cancer Father   . Bladder Cancer Father     ALLERGIES:  has No Known Allergies.  MEDICATIONS:  Current Outpatient Medications  Medication Sig Dispense Refill  . acetaminophen-codeine (TYLENOL #3) 300-30 MG tablet Take 1 tablet by mouth every 6 (six) hours as needed for moderate pain or severe pain. (Patient taking differently: Take 1 tablet by mouth every 8 (eight) hours as needed for moderate pain or severe pain. ) 60 tablet 0  . irbesartan (AVAPRO) 150 MG tablet Take 150 mg by mouth daily.  2  . Multiple Vitamins-Minerals (MULTIVITAMIN WITH MINERALS) tablet Take 1 tablet by mouth daily.    . rosuvastatin (CRESTOR) 10 MG tablet Take 10 mg by mouth every evening.  4   No current facility-administered medications for this visit.      PHYSICAL EXAMINATION: ECOG PERFORMANCE STATUS: 0 - Asymptomatic Vitals:   12/14/17 1129  BP: 122/82  Pulse: 100  Temp: 98.6 F (37 C)   Filed Weights   12/14/17 1129  Weight: 227 lb (103 kg)    Physical Exam  Constitutional: He is oriented to person, place, and time and well-developed, well-nourished, and in no distress. No distress.  HENT:  Head: Normocephalic and atraumatic.  Left Ear: External ear normal.  Eyes: Conjunctivae and EOM are normal. Pupils are equal, round, and reactive to light. Left eye exhibits no discharge.  Neck: Normal range of motion. Neck supple. No thyromegaly present.  Right side neck mass. Tongue base mass.  Cardiovascular: Normal rate and regular rhythm.  No murmur heard. Pulmonary/Chest: Breath sounds normal. He has no wheezes. He exhibits no tenderness.  Abdominal: Soft. Bowel sounds are normal. He exhibits no distension.  Musculoskeletal: Normal range of motion. He exhibits no edema or tenderness.  Lymphadenopathy:    He has no cervical adenopathy.  Neurological: He is alert and oriented to person, place, and time. Gait normal.  Skin: Skin is dry. He is not diaphoretic. No erythema.  Psychiatric: Affect  normal.     LABORATORY DATA:  I have reviewed the data as listed Lab Results  Component Value Date   WBC 9.9 12/11/2017   HGB 15.5 12/11/2017   HCT 46.3 12/11/2017   MCV 88.5 12/11/2017   PLT 262 12/11/2017   Recent Labs    11/15/17 1437 12/11/17 1143  NA  --  136  K  --  4.8  CL  --  102  CO2  --  23  GLUCOSE  --  104*  BUN  --  26*  CREATININE 1.20 1.13  CALCIUM  --  9.8  GFRNONAA  --  >60  GFRAA  --  >60  PROT  --  8.1  ALBUMIN  --  4.4  AST  --  25  ALT  --  32  ALKPHOS  --  95  BILITOT  --  1.1     RADIOGRAPHIC STUDIES: I have personally reviewed the radiological images as listed and agreed with the findings in the report. PET scan 12/13/2017 1. Primary hypermetabolic mucosal lesion in the left oropharynx/tongue base with right-sided bulky multilevel lymphadenopathy. 2.  No findings for metastatic disease involving the chest, abdomen, pelvis or bony structures.  ASSESSMENT & PLAN:  1. 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 Neoplasm related pain   PET scan results was discussed with patient. He had left side Tongue mass and contralateral nodal involvements.  Clinically he has cT2 cN2 disease, stage II.   As his neck mass does not cause any obstruction, I recommend concurrent cisplatin chemotherapy with radiation.   I explained to the patient the risks and benefits of chemotherapy including all but not limited to hair loss, hearing loss, kidney failure, mouth sore, nausea, vomiting, low blood counts, bleeding, and risk of life threatening infection and even death, secondary malignancy etc.   Patient voicing understanding and willing to proceed.   # Chemotherapy education; port placement. Antiemetics-Zofran and Compazine; EMLA cream sent to pharmacy # refer to vascular surgeon for medi port placement.  # recommend checking baseline audiogram, patient declined.  # CBC and CMP prior to chemotherapy.  # Advise patient to avoid NSAIDS due to potential  renal toxicity. Switch to percocet 5mg  Q6h as needed.   # chemotherapy class.  # refer to nutritionist.   All questions were answered. The patient knows to call the clinic with any problems questions or concerns.  Return of visit: Day 1 of concurrent chemo and radiation.      Earlie Server, MD, PhD Hematology Oncology [facility] Pager- [phone] 12/14/2017

## 2017-12-14 ENCOUNTER — Encounter: Payer: Self-pay | Admitting: Oncology

## 2017-12-14 ENCOUNTER — Inpatient Hospital Stay: Payer: BLUE CROSS/BLUE SHIELD | Admitting: Oncology

## 2017-12-14 ENCOUNTER — Other Ambulatory Visit: Payer: Self-pay

## 2017-12-14 VITALS — BP 122/82 | HR 100 | Temp 98.6°F | Wt 227.0 lb

## 2017-12-14 DIAGNOSIS — Z8052 Family history of malignant neoplasm of bladder: Secondary | ICD-10-CM | POA: Diagnosis not present

## 2017-12-14 DIAGNOSIS — Z808 Family history of malignant neoplasm of other organs or systems: Secondary | ICD-10-CM | POA: Diagnosis not present

## 2017-12-14 DIAGNOSIS — C109 Malignant neoplasm of oropharynx, unspecified: Secondary | ICD-10-CM

## 2017-12-14 DIAGNOSIS — Z8042 Family history of malignant neoplasm of prostate: Secondary | ICD-10-CM

## 2017-12-14 DIAGNOSIS — E785 Hyperlipidemia, unspecified: Secondary | ICD-10-CM | POA: Diagnosis not present

## 2017-12-14 DIAGNOSIS — Z803 Family history of malignant neoplasm of breast: Secondary | ICD-10-CM | POA: Diagnosis not present

## 2017-12-14 DIAGNOSIS — I10 Essential (primary) hypertension: Secondary | ICD-10-CM | POA: Diagnosis not present

## 2017-12-14 DIAGNOSIS — G893 Neoplasm related pain (acute) (chronic): Secondary | ICD-10-CM

## 2017-12-14 MED ORDER — ONDANSETRON HCL 8 MG PO TABS: 8.0000 mg | ORAL_TABLET | Freq: Two times a day (BID) | ORAL | 1 refills | Status: DC | PRN

## 2017-12-14 MED ORDER — OXYCODONE-ACETAMINOPHEN 5-325 MG PO TABS: 1.0000 | ORAL_TABLET | Freq: Four times a day (QID) | ORAL | 0 refills | Status: DC | PRN

## 2017-12-14 MED ORDER — LIDOCAINE-PRILOCAINE 2.5-2.5 % EX CREA: TOPICAL_CREAM | CUTANEOUS | 3 refills | Status: DC

## 2017-12-14 MED ORDER — PROCHLORPERAZINE MALEATE 10 MG PO TABS: 10.0000 mg | ORAL_TABLET | Freq: Four times a day (QID) | ORAL | 1 refills | Status: DC | PRN

## 2017-12-14 NOTE — Progress Notes (Signed)
START ON PATHWAY REGIMEN - Head and Neck     A cycle is every 21 days:     Cisplatin   **Always confirm dose/schedule in your pharmacy ordering system**    Patient Characteristics: Oropharynx, HPV Positive, Clinically Staged, T0-4, cN1-3 or T3-4, cN0 Disease Classification: Oropharynx HPV Status: Positive (+) AJCC N Category: cN2 AJCC 8 Stage Grouping: II Current Disease Status: No Distant Metastases and No Recurrent Disease AJCC T Category: T2 AJCC M Category: M0 Intent of Therapy: Curative Intent, Discussed with Patient

## 2017-12-14 NOTE — Progress Notes (Signed)
Patient here today for follow up.  Patient c/o new onset of congestion, feeling as though he has to constantly clear his throat, and hoarse voice.

## 2017-12-14 NOTE — Patient Instructions (Signed)
Cisplatin injection  What is this medicine?  CISPLATIN (SIS pla tin) is a chemotherapy drug. It targets fast dividing cells, like cancer cells, and causes these cells to die. This medicine is used to treat many types of cancer like bladder, ovarian, and testicular cancers.  This medicine may be used for other purposes; ask your health care provider or pharmacist if you have questions.  COMMON BRAND NAME(S): Platinol, Platinol -AQ  What should I tell my health care provider before I take this medicine?  They need to know if you have any of these conditions:  -blood disorders  -hearing problems  -kidney disease  -recent or ongoing radiation therapy  -an unusual or allergic reaction to cisplatin, carboplatin, other chemotherapy, other medicines, foods, dyes, or preservatives  -pregnant or trying to get pregnant  -breast-feeding  How should I use this medicine?  This drug is given as an infusion into a vein. It is administered in a hospital or clinic by a specially trained health care professional.  Talk to your pediatrician regarding the use of this medicine in children. Special care may be needed.  Overdosage: If you think you have taken too much of this medicine contact a poison control center or emergency room at once.  NOTE: This medicine is only for you. Do not share this medicine with others.  What if I miss a dose?  It is important not to miss a dose. Call your doctor or health care professional if you are unable to keep an appointment.  What may interact with this medicine?  -dofetilide  -foscarnet  -medicines for seizures  -medicines to increase blood counts like filgrastim, pegfilgrastim, sargramostim  -probenecid  -pyridoxine used with altretamine  -rituximab  -some antibiotics like amikacin, gentamicin, neomycin, polymyxin B, streptomycin, tobramycin  -sulfinpyrazone  -vaccines  -zalcitabine  Talk to your doctor or health care professional before taking any of these  medicines:  -acetaminophen  -aspirin  -ibuprofen  -ketoprofen  -naproxen  This list may not describe all possible interactions. Give your health care provider a list of all the medicines, herbs, non-prescription drugs, or dietary supplements you use. Also tell them if you smoke, drink alcohol, or use illegal drugs. Some items may interact with your medicine.  What should I watch for while using this medicine?  Your condition will be monitored carefully while you are receiving this medicine. You will need important blood work done while you are taking this medicine.  This drug may make you feel generally unwell. This is not uncommon, as chemotherapy can affect healthy cells as well as cancer cells. Report any side effects. Continue your course of treatment even though you feel ill unless your doctor tells you to stop.  In some cases, you may be given additional medicines to help with side effects. Follow all directions for their use.  Call your doctor or health care professional for advice if you get a fever, chills or sore throat, or other symptoms of a cold or flu. Do not treat yourself. This drug decreases your body's ability to fight infections. Try to avoid being around people who are sick.  This medicine may increase your risk to bruise or bleed. Call your doctor or health care professional if you notice any unusual bleeding.  Be careful brushing and flossing your teeth or using a toothpick because you may get an infection or bleed more easily. If you have any dental work done, tell your dentist you are receiving this medicine.  Avoid taking products   that contain aspirin, acetaminophen, ibuprofen, naproxen, or ketoprofen unless instructed by your doctor. These medicines may hide a fever.  Do not become pregnant while taking this medicine. Women should inform their doctor if they wish to become pregnant or think they might be pregnant. There is a potential for serious side effects to an unborn child. Talk to  your health care professional or pharmacist for more information. Do not breast-feed an infant while taking this medicine.  Drink fluids as directed while you are taking this medicine. This will help protect your kidneys.  Call your doctor or health care professional if you get diarrhea. Do not treat yourself.  What side effects may I notice from receiving this medicine?  Side effects that you should report to your doctor or health care professional as soon as possible:  -allergic reactions like skin rash, itching or hives, swelling of the face, lips, or tongue  -signs of infection - fever or chills, cough, sore throat, pain or difficulty passing urine  -signs of decreased platelets or bleeding - bruising, pinpoint red spots on the skin, black, tarry stools, nosebleeds  -signs of decreased red blood cells - unusually weak or tired, fainting spells, lightheadedness  -breathing problems  -changes in hearing  -gout pain  -low blood counts - This drug may decrease the number of white blood cells, red blood cells and platelets. You may be at increased risk for infections and bleeding.  -nausea and vomiting  -pain, swelling, redness or irritation at the injection site  -pain, tingling, numbness in the hands or feet  -problems with balance, movement  -trouble passing urine or change in the amount of urine  Side effects that usually do not require medical attention (report to your doctor or health care professional if they continue or are bothersome):  -changes in vision  -loss of appetite  -metallic taste in the mouth or changes in taste  This list may not describe all possible side effects. Call your doctor for medical advice about side effects. You may report side effects to FDA at 1-800-FDA-1088.  Where should I keep my medicine?  This drug is given in a hospital or clinic and will not be stored at home.  NOTE: This sheet is a summary. It may not cover all possible information. If you have questions about this medicine,  talk to your doctor, pharmacist, or health care provider.   2018 Elsevier/Gold Standard (2008-02-25 14:40:54)

## 2017-12-17 ENCOUNTER — Inpatient Hospital Stay: Payer: BLUE CROSS/BLUE SHIELD

## 2017-12-17 ENCOUNTER — Other Ambulatory Visit (INDEPENDENT_AMBULATORY_CARE_PROVIDER_SITE_OTHER): Payer: Self-pay | Admitting: Vascular Surgery

## 2017-12-18 ENCOUNTER — Ambulatory Visit
Admission: RE | Admit: 2017-12-18 | Discharge: 2017-12-18 | Disposition: A | Discharge status: Home / Self Care | Payer: BLUE CROSS/BLUE SHIELD | Source: Ambulatory Visit | Attending: Radiation Oncology | Admitting: Radiation Oncology

## 2017-12-18 ENCOUNTER — Other Ambulatory Visit: Payer: Self-pay | Admitting: *Deleted

## 2017-12-18 DIAGNOSIS — R221 Localized swelling, mass and lump, neck: Secondary | ICD-10-CM | POA: Diagnosis not present

## 2017-12-18 DIAGNOSIS — C109 Malignant neoplasm of oropharynx, unspecified: Secondary | ICD-10-CM

## 2017-12-18 MED ORDER — SODIUM CHLORIDE 0.9 % IR SOLN
Freq: Once | Status: AC
  Administered 2017-12-19: 11:00:00
  Filled 2017-12-18: qty 2

## 2017-12-18 MED ORDER — CEFAZOLIN SODIUM-DEXTROSE 2-4 GM/100ML-% IV SOLN
2.0000 g | Freq: Once | INTRAVENOUS | Status: AC
  Administered 2017-12-19: 2 g via INTRAVENOUS

## 2017-12-19 ENCOUNTER — Ambulatory Visit
Admission: RE | Admit: 2017-12-19 | Discharge: 2017-12-19 | Disposition: A | Discharge status: Home / Self Care | Payer: BLUE CROSS/BLUE SHIELD | Source: Ambulatory Visit | Attending: Vascular Surgery | Admitting: Vascular Surgery

## 2017-12-19 ENCOUNTER — Encounter
Admission: RE | Disposition: A | Discharge status: Home / Self Care | Payer: Self-pay | Source: Ambulatory Visit | Attending: Vascular Surgery

## 2017-12-19 DIAGNOSIS — I10 Essential (primary) hypertension: Secondary | ICD-10-CM | POA: Diagnosis not present

## 2017-12-19 DIAGNOSIS — Z9889 Other specified postprocedural states: Secondary | ICD-10-CM | POA: Insufficient documentation

## 2017-12-19 DIAGNOSIS — Z8042 Family history of malignant neoplasm of prostate: Secondary | ICD-10-CM | POA: Insufficient documentation

## 2017-12-19 DIAGNOSIS — C109 Malignant neoplasm of oropharynx, unspecified: Secondary | ICD-10-CM | POA: Insufficient documentation

## 2017-12-19 DIAGNOSIS — Z8249 Family history of ischemic heart disease and other diseases of the circulatory system: Secondary | ICD-10-CM | POA: Insufficient documentation

## 2017-12-19 DIAGNOSIS — Z825 Family history of asthma and other chronic lower respiratory diseases: Secondary | ICD-10-CM | POA: Diagnosis not present

## 2017-12-19 DIAGNOSIS — Z808 Family history of malignant neoplasm of other organs or systems: Secondary | ICD-10-CM | POA: Insufficient documentation

## 2017-12-19 DIAGNOSIS — C760 Malignant neoplasm of head, face and neck: Secondary | ICD-10-CM

## 2017-12-19 DIAGNOSIS — E785 Hyperlipidemia, unspecified: Secondary | ICD-10-CM | POA: Diagnosis not present

## 2017-12-19 DIAGNOSIS — M1990 Unspecified osteoarthritis, unspecified site: Secondary | ICD-10-CM | POA: Diagnosis not present

## 2017-12-19 DIAGNOSIS — Z803 Family history of malignant neoplasm of breast: Secondary | ICD-10-CM | POA: Insufficient documentation

## 2017-12-19 DIAGNOSIS — Z8052 Family history of malignant neoplasm of bladder: Secondary | ICD-10-CM | POA: Diagnosis not present

## 2017-12-19 HISTORY — PX: PORTA CATH INSERTION: CATH118285

## 2017-12-19 SURGERY — PORTA CATH INSERTION
Anesthesia: Moderate Sedation

## 2017-12-19 MED ORDER — SODIUM CHLORIDE 0.9 % IV SOLN
INTRAVENOUS | Status: DC
  Administered 2017-12-19: 11:00:00 via INTRAVENOUS

## 2017-12-19 MED ORDER — MIDAZOLAM HCL 5 MG/5ML IJ SOLN
INTRAMUSCULAR | Status: AC
  Filled 2017-12-19: qty 5

## 2017-12-19 MED ORDER — HYDROMORPHONE HCL 1 MG/ML IJ SOLN: 1.0000 mg | Freq: Once | INTRAMUSCULAR | Status: DC | PRN

## 2017-12-19 MED ORDER — FENTANYL CITRATE (PF) 100 MCG/2ML IJ SOLN
INTRAMUSCULAR | Status: DC | PRN
  Administered 2017-12-19 (×2): 50 ug via INTRAVENOUS

## 2017-12-19 MED ORDER — HEPARIN (PORCINE) IN NACL 2-0.9 UNIT/ML-% IJ SOLN
INTRAMUSCULAR | Status: AC
  Filled 2017-12-19: qty 500

## 2017-12-19 MED ORDER — FENTANYL CITRATE (PF) 100 MCG/2ML IJ SOLN
INTRAMUSCULAR | Status: AC
  Filled 2017-12-19: qty 2

## 2017-12-19 MED ORDER — MIDAZOLAM HCL 2 MG/2ML IJ SOLN
INTRAMUSCULAR | Status: DC | PRN
  Administered 2017-12-19 (×2): 2 mg via INTRAVENOUS

## 2017-12-19 MED ORDER — LIDOCAINE-EPINEPHRINE (PF) 1 %-1:200000 IJ SOLN
INTRAMUSCULAR | Status: AC
  Filled 2017-12-19: qty 30

## 2017-12-19 MED ORDER — ONDANSETRON HCL 4 MG/2ML IJ SOLN: 4.0000 mg | Freq: Four times a day (QID) | INTRAMUSCULAR | Status: DC | PRN

## 2017-12-19 SURGICAL SUPPLY — 7 items
KIT PORT POWER 8FR ISP CVUE (Miscellaneous) ×3 IMPLANT
PACK ANGIOGRAPHY (CUSTOM PROCEDURE TRAY) ×3 IMPLANT
PAD GROUND ADULT SPLIT (MISCELLANEOUS) ×3 IMPLANT
PENCIL ELECTRO HAND CTR (MISCELLANEOUS) ×3 IMPLANT
SUT MNCRL AB 4-0 PS2 18 (SUTURE) ×3 IMPLANT
SUT PROLENE 0 CT 1 30 (SUTURE) ×3 IMPLANT
SUTURE VIC 3-0 (SUTURE) ×3 IMPLANT

## 2017-12-19 NOTE — Progress Notes (Signed)
Discharge teaching done with questions answered,with friend present. Denies complaints.

## 2017-12-19 NOTE — Op Note (Signed)
      [facility]       Operative Note  Date: 12/19/2017  Preoperative diagnosis:  1. Head and neck cancer  Postoperative diagnosis:  Same as above  Procedures: #1. Ultrasound guidance for vascular access to the left internal jugular vein. #2. Fluoroscopic guidance for placement of catheter. #3. Placement of CT compatible Port-A-Cath, left internal jugular vein.  Surgeon: Leotis Pain, MD.   Anesthesia: Local with moderate conscious sedation for approximately 20  minutes using 4 mg of Versed and 100 mcg of Fentanyl  Fluoroscopy time: less than 1 minute  Contrast used: 0  Estimated blood loss: 10 cc  Indication for the procedure:  The patient is a 62 y.o.male with head and neck cancer.  The patient needs a Port-A-Cath for durable venous access, chemotherapy, lab draws, and CT scans. We are asked to place this. Risks and benefits were discussed and informed consent was obtained.  Description of procedure: The patient was brought to the vascular and interventional radiology suite.  Moderate conscious sedation was administered throughout the procedure during a face to face encounter with the patient with my supervision of the RN administering medicines and monitoring the patient's vital signs, pulse oximetry, telemetry and mental status throughout from the start of the procedure until the patient was taken to the recovery room. The mass was predominately on the right, so we placed the port on the left. The left neck chest and shoulder were sterilely prepped and draped, and a sterile surgical field was created. Ultrasound was used to help visualize a patent left internal jugular vein. This was then accessed under direct ultrasound guidance without difficulty with the Seldinger needle and a permanent image was recorded. A J-wire was placed. After skin nick and dilatation, the peel-away sheath was then placed over the wire. I then anesthetized an area under the clavicle  approximately 1-2 fingerbreadths. A transverse incision was created and an inferior pocket was created with electrocautery and blunt dissection. The port was then brought onto the field, placed into the pocket and secured to the chest wall with 2 Prolene sutures. The catheter was connected to the port and tunneled from the subclavicular incision to the access site. Fluoroscopic guidance was then used to cut the catheter to an appropriate length. The catheter was then placed through the peel-away sheath and the peel-away sheath was removed. The catheter tip was parked in excellent location under fluorocoscopic guidance in the cavoatrial junction. The pocket was then irrigated with antibiotic impregnated saline and the wound was closed with a running 3-0 Vicryl and a 4-0 Monocryl. The access incision was closed with a single 4-0 Monocryl. The Huber needle was used to withdraw blood and flush the port with heparinized saline. Dermabond was then placed as a dressing. The patient tolerated the procedure well and was taken to the recovery room in stable condition.   Leotis Pain 12/19/2017 11:55 AM   This note was created with Dragon Medical transcription system. Any errors in dictation are purely unintentional.

## 2017-12-19 NOTE — Discharge Instructions (Signed)
Moderate Conscious Sedation, Adult, Care After °These instructions provide you with information about caring for yourself after your procedure. Your health care provider may also give you more specific instructions. Your treatment has been planned according to current medical practices, but problems sometimes occur. Call your health care provider if you have any problems or questions after your procedure. °What can I expect after the procedure? °After your procedure, it is common: °· To feel sleepy for several hours. °· To feel clumsy and have poor balance for several hours. °· To have poor judgment for several hours. °· To vomit if you eat too soon. ° °Follow these instructions at home: °For at least 24 hours after the procedure: ° °· Do not: °? Participate in activities where you could fall or become injured. °? Drive. °? Use heavy machinery. °? Drink alcohol. °? Take sleeping pills or medicines that cause drowsiness. °? Make important decisions or sign legal documents. °? Take care of children on your own. °· Rest. °Eating and drinking °· Follow the diet recommended by your health care provider. °· If you vomit: °? Drink water, juice, or soup when you can drink without vomiting. °? Make sure you have little or no nausea before eating solid foods. °General instructions °· Have a responsible adult stay with you until you are awake and alert. °· Take over-the-counter and prescription medicines only as told by your health care provider. °· If you smoke, do not smoke without supervision. °· Keep all follow-up visits as told by your health care provider. This is important. °Contact a health care provider if: °· You keep feeling nauseous or you keep vomiting. °· You feel light-headed. °· You develop a rash. °· You have a fever. °Get help right away if: °· You have trouble breathing. °This information is not intended to replace advice given to you by your health care provider. Make sure you discuss any questions you have  with your health care provider. °Document Released: 09/10/2013 Document Revised: 04/24/2016 Document Reviewed: 03/11/2016 °Elsevier Interactive Patient Education © 2018 Elsevier Inc. °Implanted Port Insertion, Care After °This sheet gives you information about how to care for yourself after your procedure. Your health care provider may also give you more specific instructions. If you have problems or questions, contact your health care provider. °What can I expect after the procedure? °After your procedure, it is common to have: °· Discomfort at the port insertion site. °· Bruising on the skin over the port. This should improve over 3-4 days. ° °Follow these instructions at home: °Port care °· After your port is placed, you will get a manufacturer's information card. The card has information about your port. Keep this card with you at all times. °· Take care of the port as told by your health care provider. Ask your health care provider if you or a family member can get training for taking care of the port at home. A home health care nurse may also take care of the port. °· Make sure to remember what type of port you have. °Incision care °· Follow instructions from your health care provider about how to take care of your port insertion site. Make sure you: °? Wash your hands with soap and water before you change your bandage (dressing). If soap and water are not available, use hand sanitizer. °? Change your dressing as told by your health care provider. °? Leave stitches (sutures), skin glue, or adhesive strips in place. These skin closures may need to stay   in place for 2 weeks or longer. If adhesive strip edges start to loosen and curl up, you may trim the loose edges. Do not remove adhesive strips completely unless your health care provider tells you to do that. °· Check your port insertion site every day for signs of infection. Check for: °? More redness, swelling, or pain. °? More fluid or  blood. °? Warmth. °? Pus or a bad smell. °General instructions °· Do not take baths, swim, or use a hot tub until your health care provider approves. °· Do not lift anything that is heavier than 10 lb (4.5 kg) for a week, or as told by your health care provider. °· Ask your health care provider when it is okay to: °? Return to work or school. °? Resume usual physical activities or sports. °· Do not drive for 24 hours if you were given a medicine to help you relax (sedative). °· Take over-the-counter and prescription medicines only as told by your health care provider. °· Wear a medical alert bracelet in case of an emergency. This will tell any health care providers that you have a port. °· Keep all follow-up visits as told by your health care provider. This is important. °Contact a health care provider if: °· You cannot flush your port with saline as directed, or you cannot draw blood from the port. °· You have a fever or chills. °· You have more redness, swelling, or pain around your port insertion site. °· You have more fluid or blood coming from your port insertion site. °· Your port insertion site feels warm to the touch. °· You have pus or a bad smell coming from the port insertion site. °Get help right away if: °· You have chest pain or shortness of breath. °· You have bleeding from your port that you cannot control. °Summary °· Take care of the port as told by your health care provider. °· Change your dressing as told by your health care provider. °· Keep all follow-up visits as told by your health care provider. °This information is not intended to replace advice given to you by your health care provider. Make sure you discuss any questions you have with your health care provider. °Document Released: 09/10/2013 Document Revised: 10/11/2016 Document Reviewed: 10/11/2016 °Elsevier Interactive Patient Education © 2017 Elsevier Inc. ° °

## 2017-12-19 NOTE — H&P (Signed)
[facility] History & Physical Update  The patient was interviewed and re-examined.  The patient's previous History and Physical has been reviewed and is unchanged.  There is no change in the plan of care. We plan to proceed with the scheduled procedure.  Leotis Pain, MD  12/19/2017, 11:00 AM

## 2017-12-19 NOTE — Progress Notes (Signed)
Patient clinicallly stable post port placement. Vitals stable. Dr Lucky Cowboy out to speak with patient and family member with questions answered. Denies complaints. Eating lunch at this time.

## 2017-12-20 ENCOUNTER — Encounter: Payer: Self-pay | Admitting: Vascular Surgery

## 2017-12-21 DIAGNOSIS — R221 Localized swelling, mass and lump, neck: Secondary | ICD-10-CM | POA: Diagnosis not present

## 2017-12-25 NOTE — Progress Notes (Signed)
Hematology/Oncology  Follow Up note [facility] Telephone:([phone] Fax:([phone]   Patient Care Team: Albina Billet, MD as PCP - General (Internal Medicine) Albina Billet, MD (Internal Medicine) Bary Castilla Forest Gleason, MD (General Surgery)  REFERRING PROVIDER: Dr.McQueen CHIEF COMPLAINTS/PURPOSE OF CONSULTATION:  Evaluation of newly diagnosed head and neck cancer.  HISTORY OF PRESENTING ILLNESS:  Ivan Garcia. is a  62 y.o.  male with PMH listed below who was referred to me for evaluation of head and neck cancer. Patient recently found right neck mass which prompted him to see primary care physician Dr. Hall Busing. Dr. Hall Busing refer patient to be evaluated by ENT Dr. Tami Ribas. With ENT examination, he was found to have midline tongue base pedunculated mass, 3cm.  And had CT soft tissue neck which confirmed pronounced lymphadenopathy in the right neck including conglomerate partially necrotic nodal mass spanning level III, 4 and 5, measuring 5.2 x 5.5 x 3.7 cm. Additional pathological nodes and right level 2 about 2.5 cm. There is asymmetry in the right tonsillar region. 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 Patient reports feeling at baseline. He had right neck discomfort, soreness for which he takes ibuprofen as needed. Denies any swallowing difficulty, shortness of breath, abdominal pain cough or lower extremity swelling.  INTERVAL HISTORY [name]. is a 62 y.o. male who has above history reviewed by me today presents for follow up visit for management of newly diagnosed squamous carcinoma of oropharynx. Patient reports that he continued to have right neck pain, intermittent, pain is better while taking percocet.  He doesn't have any swallowing difficulty. No other complaints.Has not met Nutritionist yet.   Review of Systems  Constitutional: Negative for chills, diaphoresis, fever  and malaise/fatigue.  HENT: Negative for ear discharge, ear pain, hearing loss and tinnitus.        Right neck mass soreness  Eyes: Negative for blurred vision, photophobia and discharge.  Respiratory: Negative for cough.   Cardiovascular: Negative for chest pain and orthopnea.  Gastrointestinal: Negative for diarrhea, heartburn, nausea and vomiting.  Genitourinary: Negative for dysuria, frequency and urgency.  Musculoskeletal: Negative for myalgias.  Skin: Negative for rash.  Neurological: Negative for dizziness and tremors.  Endo/Heme/Allergies: Negative for polydipsia. Does not bruise/bleed easily.  Psychiatric/Behavioral: Negative for depression and substance abuse. The patient is not nervous/anxious.     MEDICAL HISTORY:  Past Medical History:  Diagnosis Date  . Arthritis   . Hemorrhoids   . Hyperlipidemia   . Hypertension     SURGICAL HISTORY: Past Surgical History:  Procedure Laterality Date  . COLONOSCOPY WITH PROPOFOL N/A 04/04/2017   Procedure: COLONOSCOPY WITH PROPOFOL;  Surgeon: Robert Bellow, MD;  Location: [facility] ENDOSCOPY;  Service: Endoscopy;  Laterality: N/A;  . MANDIBLE SURGERY    . PORTA CATH INSERTION N/A 12/19/2017   Procedure: PORTA CATH INSERTION;  Surgeon: Algernon Huxley, MD;  Location: [place] [facility];  Service: Cardiovascular;  Laterality: N/A;  . TONSILLECTOMY      SOCIAL HISTORY: Social History   Socioeconomic History  . Marital status: Single    Spouse name: Not on file  . Number of children: Not on file  . Years of education: Not on file  . Highest education level: Not on file  Social Needs  . Financial resource strain: Not on file  . Food insecurity - worry: Not on file  . Food insecurity - inability: Not  on file  . Transportation needs - medical: Not on file  . Transportation needs - non-medical: Not on file  Occupational History  . Not on file  Tobacco Use  . Smoking status: Never Smoker  . Smokeless tobacco: Never Used    Substance and Sexual Activity  . Alcohol use: No  . Drug use: No  . Sexual activity: Not on file  Other Topics Concern  . Not on file  Social History Narrative  . Not on file    FAMILY HISTORY: Family History  Problem Relation Age of Onset  . Breast cancer Mother   . Asthma Mother   . Congestive Heart Failure Mother   . Prostate cancer Father   . Brain cancer Father   . Bladder Cancer Father     ALLERGIES:  has No Known Allergies.  MEDICATIONS:  Current Outpatient Medications  Medication Sig Dispense Refill  . docusate sodium (COLACE) 100 MG capsule Take 100 mg by mouth 2 (two) times daily as needed for mild constipation.    . irbesartan (AVAPRO) 150 MG tablet Take 150 mg by mouth daily.  2  . lidocaine-prilocaine (EMLA) cream Apply to affected area once 30 g 3  . Multiple Vitamins-Minerals (MULTIVITAMIN WITH MINERALS) tablet Take 1 tablet by mouth daily.    . ondansetron (ZOFRAN) 8 MG tablet Take 1 tablet (8 mg total) by mouth 2 (two) times daily as needed. 30 tablet 1  . oxyCODONE-acetaminophen (PERCOCET/ROXICET) 5-325 MG tablet Take 1 tablet by mouth every 6 (six) hours as needed for severe pain. 56 tablet 0  . prochlorperazine (COMPAZINE) 10 MG tablet Take 1 tablet (10 mg total) by mouth every 6 (six) hours as needed (Nausea or vomiting). 30 tablet 1  . rosuvastatin (CRESTOR) 10 MG tablet Take 10 mg by mouth every evening.  4   No current facility-administered medications for this visit.    Facility-Administered Medications Ordered in Other Visits  Medication Dose Route Frequency Provider Last Rate Last Dose  . 0.9 %  sodium chloride infusion   Intravenous Continuous Earlie Server, MD 10 mL/hr at 12/26/17 0930    . dextrose 5 % and 0.45% NaCl 1,000 mL with potassium chloride 20 mEq, magnesium sulfate 12 mEq, mannitol 12.5 g infusion   Intravenous Once Earlie Server, MD   Stopped at 12/26/17 1410  . heparin lock flush 100 unit/mL  500 Units Intravenous Once Earlie Server, MD          PHYSICAL EXAMINATION: ECOG PERFORMANCE STATUS: 0 - Asymptomatic Vitals:   12/26/17 0847  BP: 132/84  Pulse: 82  Resp: 18  Temp: 98.9 F (37.2 C)  SpO2: 97%   Filed Weights   12/26/17 0847  Weight: 217 lb (98.4 kg)    Physical Exam  Constitutional: He is oriented to person, place, and time and well-developed, well-nourished, and in no distress. No distress.  HENT:  Head: Normocephalic and atraumatic.  Left Ear: External ear normal.  Mouth/Throat: Oropharynx is clear and moist. No oropharyngeal exudate.  Eyes: Conjunctivae and EOM are normal. Pupils are equal, round, and reactive to light. Left eye exhibits no discharge. No scleral icterus.  Neck: Normal range of motion. Neck supple. No JVD present. No thyromegaly present.  Right side neck mass. Tongue base mass.  Cardiovascular: Normal rate and regular rhythm.  No murmur heard. Pulmonary/Chest: Breath sounds normal. He has no wheezes. He has no rales. He exhibits no tenderness.  Abdominal: Soft. Bowel sounds are normal. He exhibits no distension. There is  no tenderness.  Musculoskeletal: Normal range of motion. He exhibits no edema or tenderness.  Lymphadenopathy:    He has cervical adenopathy.  Neurological: He is alert and oriented to person, place, and time. No cranial nerve deficit. Gait normal.  Skin: Skin is dry. No rash noted. He is not diaphoretic. No erythema.  Psychiatric: Memory and affect normal.     LABORATORY DATA:  I have reviewed the data as listed Lab Results  Component Value Date   WBC 9.9 12/11/2017   HGB 15.5 12/11/2017   HCT 46.3 12/11/2017   MCV 88.5 12/11/2017   PLT 262 12/11/2017   Recent Labs    11/15/17 1437 12/11/17 1143  NA  --  136  K  --  4.8  CL  --  102  CO2  --  23  GLUCOSE  --  104*  BUN  --  26*  CREATININE 1.20 1.13  CALCIUM  --  9.8  GFRNONAA  --  >60  GFRAA  --  >60  PROT  --  8.1  ALBUMIN  --  4.4  AST  --  25  ALT  --  32  ALKPHOS  --  95  BILITOT  --   1.1     RADIOGRAPHIC STUDIES: I have personally reviewed the radiological images as listed and agreed with the findings in the report. PET scan 12/13/2017 1. Primary hypermetabolic mucosal lesion in the left oropharynx/tongue base with right-sided bulky multilevel lymphadenopathy. 2. No findings for metastatic disease involving the chest, abdomen, pelvis or bony structures.  ASSESSMENT & PLAN:  1. 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 Neoplasm related pain   Clinically he has cT2 cN2 disease, stage II.  OK to proceed with cycle 1 Cisplatin.  Discussed with Dr.Chrystal, patient will start RT on 12/31/2017.  Patient has declined hearing evaluation prior to chemotherapy. He has been to chemotherapy class.  # Advise patient to avoid NSAIDS due to potential renal toxicity. Switch to percocet 97m Q6h as needed.   # refer to nutritionist.   All questions were answered. The patient knows to call the clinic with any problems questions or concerns.  Return of visit: 1 week/possible hydration.       ZEarlie Server MD, PhD Hematology Oncology [facility] AL[facility] HospitalPager- [phone]/22/2019

## 2017-12-26 ENCOUNTER — Inpatient Hospital Stay: Payer: BLUE CROSS/BLUE SHIELD

## 2017-12-26 ENCOUNTER — Encounter: Payer: Self-pay | Admitting: Oncology

## 2017-12-26 ENCOUNTER — Inpatient Hospital Stay (HOSPITAL_BASED_OUTPATIENT_CLINIC_OR_DEPARTMENT_OTHER): Payer: BLUE CROSS/BLUE SHIELD | Admitting: Oncology

## 2017-12-26 VITALS — BP 132/84 | HR 82 | Temp 98.9°F | Resp 18 | Wt 217.0 lb

## 2017-12-26 DIAGNOSIS — C109 Malignant neoplasm of oropharynx, unspecified: Secondary | ICD-10-CM

## 2017-12-26 DIAGNOSIS — N179 Acute kidney failure, unspecified: Secondary | ICD-10-CM | POA: Diagnosis not present

## 2017-12-26 DIAGNOSIS — G893 Neoplasm related pain (acute) (chronic): Secondary | ICD-10-CM

## 2017-12-26 LAB — COMPREHENSIVE METABOLIC PANEL
ALK PHOS: 81 U/L (ref 38–126)
ALT: 23 U/L (ref 17–63)
AST: 24 U/L (ref 15–41)
Albumin: 3.8 g/dL (ref 3.5–5.0)
Anion gap: 7 (ref 5–15)
BILIRUBIN TOTAL: 0.8 mg/dL (ref 0.3–1.2)
BUN: 20 mg/dL (ref 6–20)
CALCIUM: 9.1 mg/dL (ref 8.9–10.3)
CO2: 26 mmol/L (ref 22–32)
CREATININE: 1.15 mg/dL (ref 0.61–1.24)
Chloride: 103 mmol/L (ref 101–111)
GFR calc non Af Amer: >60 mL/min (ref >60)
Glucose, Bld: 97 mg/dL (ref 65–99)
Potassium: 4.3 mmol/L (ref 3.5–5.1)
SODIUM: 136 mmol/L (ref 135–145)
TOTAL PROTEIN: 7.4 g/dL (ref 6.5–8.1)

## 2017-12-26 LAB — CBC WITH DIFFERENTIAL/PLATELET
Basophils Absolute: 0 10*3/uL (ref 0–0.1)
Basophils Relative: 1 %
Eosinophils Absolute: 0.1 10*3/uL (ref 0–0.7)
Eosinophils Relative: 2 %
HEMATOCRIT: 42.3 % (ref 40.0–52.0)
HEMOGLOBIN: 14.4 g/dL (ref 13.0–18.0)
LYMPHS ABS: 0.9 10*3/uL — AB (ref 1.0–3.6)
LYMPHS PCT: 13 %
MCH: 30 pg (ref 26.0–34.0)
MCHC: 34 g/dL (ref 32.0–36.0)
MCV: 88.1 fL (ref 80.0–100.0)
MONOS PCT: 7 %
Monocytes Absolute: 0.6 10*3/uL (ref 0.2–1.0)
NEUTROS ABS: 5.8 10*3/uL (ref 1.4–6.5)
NEUTROS PCT: 77 %
Platelets: 301 10*3/uL (ref 150–440)
RBC: 4.8 MIL/uL (ref 4.40–5.90)
RDW: 13.8 % (ref 11.5–14.5)
WBC: 7.5 10*3/uL (ref 3.8–10.6)

## 2017-12-26 MED ORDER — POTASSIUM CHLORIDE 2 MEQ/ML IV SOLN
Freq: Once | INTRAVENOUS | Status: AC
  Administered 2017-12-26: 10:00:00 via INTRAVENOUS
  Filled 2017-12-26: qty 1000

## 2017-12-26 MED ORDER — PALONOSETRON HCL INJECTION 0.25 MG/5ML
0.2500 mg | Freq: Once | INTRAVENOUS | Status: AC
  Administered 2017-12-26: 0.25 mg via INTRAVENOUS
  Filled 2017-12-26: qty 5

## 2017-12-26 MED ORDER — SODIUM CHLORIDE 0.9% FLUSH
10.0000 mL | Freq: Once | INTRAVENOUS | Status: AC
  Administered 2017-12-26: 10 mL via INTRAVENOUS
  Filled 2017-12-26: qty 10

## 2017-12-26 MED ORDER — SODIUM CHLORIDE 0.9 % IV SOLN
INTRAVENOUS | Status: DC
  Administered 2017-12-26: 10:00:00 via INTRAVENOUS
  Filled 2017-12-26: qty 1000

## 2017-12-26 MED ORDER — OXYCODONE-ACETAMINOPHEN 5-325 MG PO TABS
1.0000 | ORAL_TABLET | Freq: Once | ORAL | Status: AC
  Administered 2017-12-26: 1 via ORAL
  Filled 2017-12-26: qty 1

## 2017-12-26 MED ORDER — SODIUM CHLORIDE 0.9 % IV SOLN
100.0000 mg/m2 | Freq: Once | INTRAVENOUS | Status: AC
  Administered 2017-12-26: 224 mg via INTRAVENOUS
  Filled 2017-12-26: qty 224

## 2017-12-26 MED ORDER — HEPARIN SOD (PORK) LOCK FLUSH 100 UNIT/ML IV SOLN
500.0000 [IU] | Freq: Once | INTRAVENOUS | Status: AC
  Administered 2017-12-26: 500 [IU] via INTRAVENOUS
  Filled 2017-12-26: qty 5

## 2017-12-26 MED ORDER — SODIUM CHLORIDE 0.9 % IV SOLN
Freq: Once | INTRAVENOUS | Status: AC
  Administered 2017-12-26: 12:00:00 via INTRAVENOUS
  Filled 2017-12-26: qty 5

## 2017-12-27 ENCOUNTER — Inpatient Hospital Stay: Payer: BLUE CROSS/BLUE SHIELD

## 2017-12-27 ENCOUNTER — Ambulatory Visit
Admission: RE | Admit: 2017-12-27 | Discharge: 2017-12-27 | Disposition: A | Discharge status: Home / Self Care | Payer: BLUE CROSS/BLUE SHIELD | Source: Ambulatory Visit | Attending: Radiation Oncology | Admitting: Radiation Oncology

## 2017-12-27 ENCOUNTER — Telehealth: Payer: Self-pay | Admitting: Oncology

## 2017-12-27 NOTE — Progress Notes (Signed)
Nutrition Assessment   Reason for Assessment:   Referral for head and neck cancer  ASSESSMENT:  62 year old male with oropharynx cancer, HPV positive.  Patient receiving chemotherapy and radiation therapy.  Past medical history of HLD, HTN.    Met with patient following radiation therapy this am.  Patient reports appetite is good/normal.  Reports for the last few months he has been trying to eat healthier (more fruits, vegetables, oatmeal, cereal vs biscuits). Patient has also started exercising 2 days per week.  Patient reports that recently he has been eating cereal/fruit and whole wheat toast with juice and coffee.  Does not like eggs.  For lunch usually goes to buffet with salad bar, meat and 2 vegetables or sometimes will get a burger out.  For supper will get eat soup or sandwich.  Reports that he lives alone, sister lives in [place] but will come up if he needs her.  Has friends that check on him.  Mainly eats out.  Does have nutribullet that he can use.  Reports constipation after starting pain medication.  No other nutrition impact symptoms reported at this time.    Nutrition Focused Physical Exam: deferred  Medications: MVI  Labs: reviewed  Anthropometrics:   Height: 69 inches Weight: 217 lb on 1/23 Noted weight on 1/16 227 lb, wt on 12/27 222 lb, 5/2 220 UBW: 220s BMI: 32  Patient reports thinks weight has been effected due to eating healthier and exercising.  Reports he is still eating well.    Estimated Energy Needs  Kcals: 2100-2400 calories/d Protein: 110-146 g/d Fluid: 2.4 L/d  NUTRITION DIAGNOSIS: Predicted suboptimal intake related to cancer related treatment side effects as evidenced by progression of treatment   MALNUTRITION DIAGNOSIS: none at this time   INTERVENTION:  Encouraged good sources of protein.  Provided handout of soft moist protein foods. Discussed benefit of good nutrition during treatment. Discussed constipation and strategies to help with  constipation.  Fact sheet given.   Shake recipes given to patient that he can use to prepare with nutribullet that are high in calories and protein.   Provided patient with 1st complimentary case of ensure plus today.  Patient would benefit from being followed by SLP with radiation treatment planned.      MONITORING, EVALUATION, GOAL: Patient will consume adequate calories and protein to meet nutritional needs and maintain weight during treatment   NEXT VISIT: Feb 7 following radiation treatment  Ryley Teater [name], [place], [place] Registered Dietitian [phone] (pager)

## 2017-12-31 ENCOUNTER — Ambulatory Visit: Payer: BLUE CROSS/BLUE SHIELD

## 2018-01-01 ENCOUNTER — Ambulatory Visit
Admission: RE | Admit: 2018-01-01 | Discharge: 2018-01-01 | Disposition: A | Discharge status: Home / Self Care | Payer: BLUE CROSS/BLUE SHIELD | Source: Ambulatory Visit | Attending: Radiation Oncology | Admitting: Radiation Oncology

## 2018-01-01 ENCOUNTER — Ambulatory Visit: Payer: BLUE CROSS/BLUE SHIELD

## 2018-01-01 ENCOUNTER — Other Ambulatory Visit: Payer: Self-pay | Admitting: *Deleted

## 2018-01-01 DIAGNOSIS — R221 Localized swelling, mass and lump, neck: Secondary | ICD-10-CM | POA: Diagnosis not present

## 2018-01-01 MED ORDER — LANSOPRAZOLE 30 MG PO CPDR: 30.0000 mg | DELAYED_RELEASE_CAPSULE | Freq: Every day | ORAL | 4 refills | Status: DC

## 2018-01-01 NOTE — Progress Notes (Signed)
Hematology/Oncology  Follow Up note [facility] Telephone:([phone] Fax:(336) [phone]   Patient Care Team: Albina Billet, MD as PCP - General (Internal Medicine) Albina Billet, MD (Internal Medicine) Bary Castilla Forest Gleason, MD (General Surgery)  REFERRING PROVIDER: Dr.McQueen CHIEF COMPLAINTS/PURPOSE OF CONSULTATION:  Evaluation of newly diagnosed head and neck cancer.  HISTORY OF PRESENTING ILLNESS:  Ivan Dafoe. is a  62 y.o.  male with PMH listed below who was referred to me for evaluation of head and neck cancer. Patient recently found right neck mass which prompted him to see primary care physician Dr. Hall Busing. Dr. Hall Busing refer patient to be evaluated by ENT Dr. Tami Ribas. With ENT examination, he was found to have midline tongue base pedunculated mass, 3cm.  And had CT soft tissue neck which confirmed pronounced lymphadenopathy in the right neck including conglomerate partially necrotic nodal mass spanning level III, 4 and 5, measuring 5.2 x 5.5 x 3.7 cm. Additional pathological nodes and right level 2 about 2.5 cm. There is asymmetry in the right tonsillar region. Patient underwent biopsy of the right neck mass and pathology revealed squamous cancer. P16 positive. Patient reports feeling at baseline. He had right neck discomfort, soreness for which he takes ibuprofen as needed. Denies any swallowing difficulty, shortness of breath, abdominal pain cough or lower extremity swelling.  Current Cancer Treatment Cisplatin 100 mg/m2 every 3 weeks with concurrent RT which started on 12/31/2017.   INTERVAL HISTORY Ivan Derrig Tylik Treese. is a 62 y.o. male who has above history reviewed by me today presents for follow up visit for management of newly diagnosed squamous carcinoma of oropharynx. S/p cycle 1 Cisplatin.  Patient denies any pain with swallowing.  He reports drinking adequate fluid every day.  Today he has no new concerns.  His right neck mass pain has get  better and he has stopped taking narcotics.  Constipation improved with prune juice.  Review of Systems  Constitutional: Negative for chills, diaphoresis, fever and malaise/fatigue.  HENT: Negative for ear discharge, ear pain and tinnitus.   Eyes: Negative for blurred vision, photophobia and discharge.  Respiratory: Negative for cough.   Cardiovascular: Negative for chest pain and orthopnea.  Gastrointestinal: Negative for diarrhea, heartburn, nausea and vomiting.  Genitourinary: Negative for dysuria, frequency and urgency.  Musculoskeletal: Negative for myalgias.  Skin: Negative for rash.  Neurological: Negative for dizziness and tremors.  Endo/Heme/Allergies: Negative for polydipsia. Does not bruise/bleed easily.  Psychiatric/Behavioral: Negative for depression and substance abuse. The patient is not nervous/anxious.     MEDICAL HISTORY:  Past Medical History:  Diagnosis Date  . Arthritis   . Cancer ([place])   . Hemorrhoids   . Hyperlipidemia   . Hypertension     SURGICAL HISTORY: Past Surgical History:  Procedure Laterality Date  . COLONOSCOPY WITH PROPOFOL N/A 04/04/2017   Procedure: COLONOSCOPY WITH PROPOFOL;  Surgeon: Robert Bellow, MD;  Location: [facility] ENDOSCOPY;  Service: Endoscopy;  Laterality: N/A;  . MANDIBLE SURGERY    . PORTA CATH INSERTION N/A 12/19/2017   Procedure: PORTA CATH INSERTION;  Surgeon: Algernon Huxley, MD;  Location: [place] [facility] LAB;  Service: Cardiovascular;  Laterality: N/A;  . TONSILLECTOMY      SOCIAL HISTORY: Social History   Socioeconomic History  . Marital status: Single    Spouse name: Not on file  . Number of children: Not on file  . Years of education: Not on file  . Highest education level: Not on file  Social  Needs  . Financial resource strain: Not on file  . Food insecurity - worry: Not on file  . Food insecurity - inability: Not on file  . Transportation needs - medical: Not on file  . Transportation needs - non-medical: Not  on file  Occupational History  . Not on file  Tobacco Use  . Smoking status: Never Smoker  . Smokeless tobacco: Never Used  Substance and Sexual Activity  . Alcohol use: No  . Drug use: No  . Sexual activity: Not on file  Other Topics Concern  . Not on file  Social History Narrative  . Not on file    FAMILY HISTORY: Family History  Problem Relation Age of Onset  . Breast cancer Mother   . Asthma Mother   . Congestive Heart Failure Mother   . Prostate cancer Father   . Brain cancer Father   . Bladder Cancer Father     ALLERGIES:  has No Known Allergies.  MEDICATIONS:  No current facility-administered medications for this visit.    No current outpatient medications on file.   Facility-Administered Medications Ordered in Other Visits  Medication Dose Route Frequency Provider Last Rate Last Dose  . 0.9 %  sodium chloride infusion   Intravenous Continuous Demetrios Loll, MD      . acetaminophen (TYLENOL) tablet 650 mg  650 mg Oral Q6H PRN Demetrios Loll, MD       Or  . acetaminophen (TYLENOL) suppository 650 mg  650 mg Rectal Q6H PRN Demetrios Loll, MD      . albuterol (PROVENTIL) (2.5 MG/3ML) 0.083% nebulizer solution 2.5 mg  2.5 mg Nebulization Q2H PRN Demetrios Loll, MD      . bisacodyl (DULCOLAX) EC tablet 5 mg  5 mg Oral Daily PRN Demetrios Loll, MD      . docusate sodium (COLACE) capsule 100 mg  100 mg Oral BID PRN Demetrios Loll, MD      . heparin injection 5,000 Units  5,000 Units Subcutaneous Q8H Demetrios Loll, MD      . HYDROcodone-acetaminophen (NORCO/VICODIN) 5-325 MG per tablet 1-2 tablet  1-2 tablet Oral Q4H PRN Demetrios Loll, MD      . ondansetron [facility]) tablet 4 mg  4 mg Oral Q6H PRN Demetrios Loll, MD       Or  . ondansetron [facility] injection 4 mg  4 mg Intravenous Q6H PRN Demetrios Loll, MD      . pantoprazole (PROTONIX) EC tablet 40 mg  40 mg Oral Daily Demetrios Loll, MD      . senna-docusate (Senokot-S) tablet 1 tablet  1 tablet Oral QHS PRN Demetrios Loll, MD         PHYSICAL  EXAMINATION: ECOG PERFORMANCE STATUS: 0 - Asymptomatic Vitals:   01/02/18 1008  BP: 129/78  Pulse: 90  Resp: 18  Temp: 98 F (36.7 C)   Filed Weights   01/02/18 1008  Weight: 219 lb (99.3 kg)    Physical Exam  Constitutional: He is oriented to person, place, and time and well-developed, well-nourished, and in no distress. No distress.  HENT:  Head: Normocephalic and atraumatic.  Left Ear: External ear normal.  Mouth/Throat: Oropharynx is clear and moist. No oropharyngeal exudate.  Eyes: Conjunctivae and EOM are normal. Pupils are equal, round, and reactive to light. Left eye exhibits no discharge. No scleral icterus.  Neck: Normal range of motion. Neck supple. No JVD present.  Right side neck mass. Tongue base mass.  Cardiovascular: Normal rate and regular rhythm. Exam  reveals no friction rub.  No murmur heard. Pulmonary/Chest: Breath sounds normal. He has no wheezes.  Abdominal: Soft. Bowel sounds are normal. He exhibits no distension. There is no tenderness.  Musculoskeletal: Normal range of motion. He exhibits no edema or tenderness.  Lymphadenopathy:    He has cervical adenopathy.  Neurological: He is alert and oriented to person, place, and time. No cranial nerve deficit. Gait normal.  Skin: Skin is dry. No rash noted. He is not diaphoretic. No erythema.  Psychiatric: Memory and affect normal.     LABORATORY DATA:  I have reviewed the data as listed Lab Results  Component Value Date   WBC 7.5 12/26/2017   HGB 14.4 12/26/2017   HCT 42.3 12/26/2017   MCV 88.1 12/26/2017   PLT 301 12/26/2017   Recent Labs    11/15/17 1437 12/11/17 1143 12/26/17 0813  NA  --  136 136  K  --  4.8 4.3  CL  --  102 103  CO2  --  23 26  GLUCOSE  --  104* 97  BUN  --  26* 20  CREATININE 1.20 1.13 1.15  CALCIUM  --  9.8 9.1  GFRNONAA  --  >60 >60  GFRAA  --  >60 >60  PROT  --  8.1 7.4  ALBUMIN  --  4.4 3.8  AST  --  25 24  ALT  --  32 23  ALKPHOS  --  95 81  BILITOT  --   1.1 0.8     RADIOGRAPHIC STUDIES: I have personally reviewed the radiological images as listed and agreed with the findings in the report. PET scan 12/13/2017 1. Primary hypermetabolic mucosal lesion in the left oropharynx/tongue base with right-sided bulky multilevel lymphadenopathy. 2. No findings for metastatic disease involving the chest, abdomen, pelvis or bony structures.  ASSESSMENT & PLAN:  1. 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 AKI (acute kidney injury) ([place])   Clinically he has cT2 cN2 disease, stage II.  Status post cycle 1 Cisplatin.  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  Plan admit patient to hospital for continuous fluid hydration and nephrologist consult.  While waiting for bed, we have started patient on 1 L IV fluid bolus followed by continuous IVF at 100 cc/h.  All questions were answered. The patient knows to call the clinic with any problems questions or concerns.  Return of visit: After discharge.       Earlie Server, MD, PhD Hematology Oncology [facility] Pager- [phone] 01/02/2018

## 2018-01-02 ENCOUNTER — Encounter: Payer: Self-pay | Admitting: Oncology

## 2018-01-02 ENCOUNTER — Ambulatory Visit
Admission: RE | Admit: 2018-01-02 | Discharge: 2018-01-02 | Disposition: A | Discharge status: Home / Self Care | Payer: BLUE CROSS/BLUE SHIELD | Source: Ambulatory Visit | Attending: Radiation Oncology | Admitting: Radiation Oncology

## 2018-01-02 ENCOUNTER — Inpatient Hospital Stay: Payer: BLUE CROSS/BLUE SHIELD

## 2018-01-02 ENCOUNTER — Inpatient Hospital Stay (HOSPITAL_BASED_OUTPATIENT_CLINIC_OR_DEPARTMENT_OTHER): Payer: BLUE CROSS/BLUE SHIELD | Admitting: Oncology

## 2018-01-02 ENCOUNTER — Ambulatory Visit: Payer: BLUE CROSS/BLUE SHIELD

## 2018-01-02 ENCOUNTER — Other Ambulatory Visit: Payer: Self-pay

## 2018-01-02 ENCOUNTER — Inpatient Hospital Stay
Admission: AD | Admit: 2018-01-02 | Discharge: 2018-01-05 | DRG: 683 | Disposition: A | Discharge status: Home / Self Care | Payer: BLUE CROSS/BLUE SHIELD | Source: Ambulatory Visit | Attending: Internal Medicine | Admitting: Internal Medicine

## 2018-01-02 VITALS — BP 125/85 | HR 71

## 2018-01-02 VITALS — BP 129/78 | HR 90 | Temp 98.0°F | Resp 18 | Wt 219.0 lb

## 2018-01-02 DIAGNOSIS — T508X5A Adverse effect of diagnostic agents, initial encounter: Secondary | ICD-10-CM | POA: Diagnosis present

## 2018-01-02 DIAGNOSIS — N179 Acute kidney failure, unspecified: Secondary | ICD-10-CM | POA: Diagnosis present

## 2018-01-02 DIAGNOSIS — Z808 Family history of malignant neoplasm of other organs or systems: Secondary | ICD-10-CM | POA: Diagnosis not present

## 2018-01-02 DIAGNOSIS — T451X5A Adverse effect of antineoplastic and immunosuppressive drugs, initial encounter: Secondary | ICD-10-CM | POA: Diagnosis present

## 2018-01-02 DIAGNOSIS — D649 Anemia, unspecified: Secondary | ICD-10-CM | POA: Diagnosis not present

## 2018-01-02 DIAGNOSIS — E875 Hyperkalemia: Secondary | ICD-10-CM | POA: Diagnosis present

## 2018-01-02 DIAGNOSIS — K649 Unspecified hemorrhoids: Secondary | ICD-10-CM | POA: Diagnosis not present

## 2018-01-02 DIAGNOSIS — Z8052 Family history of malignant neoplasm of bladder: Secondary | ICD-10-CM

## 2018-01-02 DIAGNOSIS — C109 Malignant neoplasm of oropharynx, unspecified: Secondary | ICD-10-CM

## 2018-01-02 DIAGNOSIS — I10 Essential (primary) hypertension: Secondary | ICD-10-CM | POA: Diagnosis present

## 2018-01-02 DIAGNOSIS — M542 Cervicalgia: Secondary | ICD-10-CM | POA: Diagnosis not present

## 2018-01-02 DIAGNOSIS — R221 Localized swelling, mass and lump, neck: Secondary | ICD-10-CM | POA: Diagnosis present

## 2018-01-02 DIAGNOSIS — E871 Hypo-osmolality and hyponatremia: Secondary | ICD-10-CM | POA: Diagnosis present

## 2018-01-02 DIAGNOSIS — M129 Arthropathy, unspecified: Secondary | ICD-10-CM | POA: Diagnosis not present

## 2018-01-02 DIAGNOSIS — Z803 Family history of malignant neoplasm of breast: Secondary | ICD-10-CM | POA: Diagnosis not present

## 2018-01-02 DIAGNOSIS — Z9221 Personal history of antineoplastic chemotherapy: Secondary | ICD-10-CM | POA: Diagnosis not present

## 2018-01-02 DIAGNOSIS — E785 Hyperlipidemia, unspecified: Secondary | ICD-10-CM | POA: Diagnosis not present

## 2018-01-02 DIAGNOSIS — G893 Neoplasm related pain (acute) (chronic): Secondary | ICD-10-CM | POA: Diagnosis not present

## 2018-01-02 DIAGNOSIS — N400 Enlarged prostate without lower urinary tract symptoms: Secondary | ICD-10-CM | POA: Diagnosis present

## 2018-01-02 DIAGNOSIS — Z510 Encounter for antineoplastic radiation therapy: Secondary | ICD-10-CM | POA: Diagnosis not present

## 2018-01-02 DIAGNOSIS — Z79899 Other long term (current) drug therapy: Secondary | ICD-10-CM | POA: Diagnosis not present

## 2018-01-02 DIAGNOSIS — Z8042 Family history of malignant neoplasm of prostate: Secondary | ICD-10-CM | POA: Diagnosis not present

## 2018-01-02 LAB — CBC WITH DIFFERENTIAL/PLATELET
BASOS ABS: 0.1 10*3/uL (ref 0–0.1)
BASOS PCT: 1 %
EOS ABS: 0.1 10*3/uL (ref 0–0.7)
Eosinophils Relative: 1 %
HEMATOCRIT: 41.2 % (ref 40.0–52.0)
HEMOGLOBIN: 13.7 g/dL (ref 13.0–18.0)
Lymphocytes Relative: 7 %
Lymphs Abs: 0.7 10*3/uL — ABNORMAL LOW (ref 1.0–3.6)
MCH: 29.6 pg (ref 26.0–34.0)
MCHC: 33.2 g/dL (ref 32.0–36.0)
MCV: 89.1 fL (ref 80.0–100.0)
MONO ABS: 0.7 10*3/uL (ref 0.2–1.0)
Monocytes Relative: 7 %
NEUTROS ABS: 8.9 10*3/uL — AB (ref 1.4–6.5)
NEUTROS PCT: 84 %
Platelets: 185 10*3/uL (ref 150–440)
RBC: 4.62 MIL/uL (ref 4.40–5.90)
RDW: 13.7 % (ref 11.5–14.5)
WBC: 10.5 10*3/uL (ref 3.8–10.6)

## 2018-01-02 LAB — COMPREHENSIVE METABOLIC PANEL
ALBUMIN: 3.4 g/dL — AB (ref 3.5–5.0)
ALT: 30 U/L (ref 17–63)
ANION GAP: 14 (ref 5–15)
AST: 22 U/L (ref 15–41)
Alkaline Phosphatase: 72 U/L (ref 38–126)
BILIRUBIN TOTAL: 0.7 mg/dL (ref 0.3–1.2)
BUN: 98 mg/dL — AB (ref 6–20)
CO2: 19 mmol/L — AB (ref 22–32)
Calcium: 8.5 mg/dL — ABNORMAL LOW (ref 8.9–10.3)
Chloride: 100 mmol/L — ABNORMAL LOW (ref 101–111)
Creatinine, Ser: 7.44 mg/dL — ABNORMAL HIGH (ref 0.61–1.24)
GFR calc Af Amer: 8 mL/min — ABNORMAL LOW (ref >60)
GFR calc non Af Amer: 7 mL/min — ABNORMAL LOW (ref >60)
GLUCOSE: 143 mg/dL — AB (ref 65–99)
POTASSIUM: 4.7 mmol/L (ref 3.5–5.1)
SODIUM: 133 mmol/L — AB (ref 135–145)
TOTAL PROTEIN: 6.7 g/dL (ref 6.5–8.1)

## 2018-01-02 MED ORDER — ONDANSETRON HCL 4 MG PO TABS: 4.0000 mg | ORAL_TABLET | Freq: Four times a day (QID) | ORAL | Status: DC | PRN

## 2018-01-02 MED ORDER — ONDANSETRON HCL 4 MG/2ML IJ SOLN: 4.0000 mg | Freq: Four times a day (QID) | INTRAMUSCULAR | Status: DC | PRN

## 2018-01-02 MED ORDER — SENNOSIDES-DOCUSATE SODIUM 8.6-50 MG PO TABS: 1.0000 | ORAL_TABLET | Freq: Every evening | ORAL | Status: DC | PRN

## 2018-01-02 MED ORDER — SODIUM CHLORIDE 0.9 % IV SOLN
INTRAVENOUS | Status: DC
  Administered 2018-01-02: 13:00:00 via INTRAVENOUS
  Filled 2018-01-02: qty 1000

## 2018-01-02 MED ORDER — HEPARIN SODIUM (PORCINE) 5000 UNIT/ML IJ SOLN
5000.0000 [IU] | Freq: Three times a day (TID) | INTRAMUSCULAR | Status: DC
  Administered 2018-01-02 – 2018-01-05 (×8): 5000 [IU] via SUBCUTANEOUS
  Filled 2018-01-02 (×8): qty 1

## 2018-01-02 MED ORDER — SODIUM CHLORIDE 0.9 % IV SOLN
INTRAVENOUS | Status: DC
  Administered 2018-01-02 – 2018-01-05 (×9): via INTRAVENOUS

## 2018-01-02 MED ORDER — DOCUSATE SODIUM 100 MG PO CAPS: 100.0000 mg | ORAL_CAPSULE | Freq: Two times a day (BID) | ORAL | Status: DC | PRN

## 2018-01-02 MED ORDER — ACETAMINOPHEN 650 MG RE SUPP: 650.0000 mg | Freq: Four times a day (QID) | RECTAL | Status: DC | PRN

## 2018-01-02 MED ORDER — ACETAMINOPHEN 325 MG PO TABS: 650.0000 mg | ORAL_TABLET | Freq: Four times a day (QID) | ORAL | Status: DC | PRN

## 2018-01-02 MED ORDER — SODIUM CHLORIDE 0.9 % IV SOLN
Freq: Once | INTRAVENOUS | Status: AC
  Administered 2018-01-02: 12:00:00 via INTRAVENOUS
  Filled 2018-01-02: qty 1000

## 2018-01-02 MED ORDER — BISACODYL 5 MG PO TBEC: 5.0000 mg | DELAYED_RELEASE_TABLET | Freq: Every day | ORAL | Status: DC | PRN

## 2018-01-02 MED ORDER — PANTOPRAZOLE SODIUM 40 MG PO TBEC
40.0000 mg | DELAYED_RELEASE_TABLET | Freq: Every day | ORAL | Status: DC
  Administered 2018-01-02 – 2018-01-05 (×4): 40 mg via ORAL
  Filled 2018-01-02 (×4): qty 1

## 2018-01-02 MED ORDER — ALBUTEROL SULFATE (2.5 MG/3ML) 0.083% IN NEBU: 2.5000 mg | INHALATION_SOLUTION | RESPIRATORY_TRACT | Status: DC | PRN

## 2018-01-02 MED ORDER — SODIUM CHLORIDE 0.9 % IV SOLN
Freq: Once | INTRAVENOUS | Status: DC
  Filled 2018-01-02: qty 1000

## 2018-01-02 MED ORDER — HYDROCODONE-ACETAMINOPHEN 5-325 MG PO TABS: 1.0000 | ORAL_TABLET | ORAL | Status: DC | PRN

## 2018-01-02 NOTE — Progress Notes (Signed)
Labs reviewed by MD, Per Dr. Tasia Catchings one liter NS to be given over one hour. Pt discharged from clinic to hospital admission via staff assist. Pt and VS stable at discharge.

## 2018-01-02 NOTE — H&P (Signed)
[place] [facility] [place] NAME: Ivan Garcia    MR#:  [id number]  DATE OF BIRTH:  [birth year]  DATE OF ADMISSION:  01/02/2018  PRIMARY CARE PHYSICIAN: Albina Billet, MD   REQUESTING/REFERRING PHYSICIAN: Earlie Server, MD  CHIEF COMPLAINT:  No chief complaint on file.  Acute renal failure HISTORY OF PRESENT ILLNESS:  Ivan Garcia  is a 62 y.o. male with a known history of head and neck cancer, hypertension hyperlipidemia.  The patient is a status opposed to chemotherapy.  He was found acute renal failure with a creatinine up to 7.44 in cancer center.  He was sent to the hospital for direct admission.  The patient denies any symptoms.  PAST MEDICAL HISTORY:   Past Medical History:  Diagnosis Date  . Arthritis   . Cancer ([place])    Head and neck cancer  . Hemorrhoids   . Hyperlipidemia   . Hypertension     PAST SURGICAL HISTORY:   Past Surgical History:  Procedure Laterality Date  . COLONOSCOPY WITH PROPOFOL N/A 04/04/2017   Procedure: COLONOSCOPY WITH PROPOFOL;  Surgeon: Robert Bellow, MD;  Location: [facility] ENDOSCOPY;  Service: Endoscopy;  Laterality: N/A;  . MANDIBLE SURGERY    . PORTA CATH INSERTION N/A 12/19/2017   Procedure: PORTA CATH INSERTION;  Surgeon: Algernon Huxley, MD;  Location: [place] CV LAB;  Service: Cardiovascular;  Laterality: N/A;  . TONSILLECTOMY      SOCIAL HISTORY:   Social History   Tobacco Use  . Smoking status: Never Smoker  . Smokeless tobacco: Never Used  Substance Use Topics  . Alcohol use: No    FAMILY HISTORY:   Family History  Problem Relation Age of Onset  . Breast cancer Mother   . Asthma Mother   . Congestive Heart Failure Mother   . Prostate cancer Father   . Brain cancer Father   . Bladder Cancer Father     DRUG ALLERGIES:  No Known Allergies  REVIEW OF SYSTEMS:   Review of Systems  Constitutional: Negative for chills, fever and malaise/fatigue.  HENT: Negative for sore  throat.   Eyes: Negative for blurred vision and double vision.  Respiratory: Negative for cough, hemoptysis, shortness of breath, wheezing and stridor.   Cardiovascular: Negative for chest pain, palpitations, orthopnea and leg swelling.  Gastrointestinal: Negative for abdominal pain, blood in stool, diarrhea, melena, nausea and vomiting.  Genitourinary: Negative for dysuria, flank pain and hematuria.  Musculoskeletal: Negative for back pain and joint pain.  Skin: Negative for rash.  Neurological: Negative for dizziness, sensory change, focal weakness, seizures, loss of consciousness, weakness and headaches.  Endo/Heme/Allergies: Negative for polydipsia.  Psychiatric/Behavioral: Negative for depression. The patient is not nervous/anxious.     MEDICATIONS AT HOME:   Prior to Admission medications   Medication Sig Start Date End Date Taking? Authorizing Provider  irbesartan (AVAPRO) 150 MG tablet Take 150 mg by mouth daily. 02/09/17  Yes [provider]  lansoprazole (PREVACID) 30 MG capsule Take 1 capsule (30 mg total) by mouth daily at 12 noon. 01/01/18  Yes Chrystal, Eulas Post, MD  Multiple Vitamins-Minerals (MULTIVITAMIN WITH MINERALS) tablet Take 1 tablet by mouth daily.   Yes [provider]  ondansetron (ZOFRAN) 8 MG tablet Take 1 tablet (8 mg total) by mouth 2 (two) times daily as needed. 12/14/17  Yes Earlie Server, MD  rosuvastatin (CRESTOR) 10 MG tablet Take 10 mg by mouth every evening. 01/26/17  Yes [provider]  docusate sodium (COLACE) 100 MG capsule Take 100 mg by mouth 2 (two) times daily as needed for mild constipation.    [provider]  lidocaine-prilocaine (EMLA) cream Apply to affected area once 12/14/17   Earlie Server, MD  oxyCODONE-acetaminophen (PERCOCET/ROXICET) 5-325 MG tablet Take 1 tablet by mouth every 6 (six) hours as needed for severe pain. 12/14/17   Earlie Server, MD  prochlorperazine (COMPAZINE) 10 MG tablet Take 1 tablet (10 mg total) by mouth  every 6 (six) hours as needed (Nausea or vomiting). 12/14/17   Earlie Server, MD      VITAL SIGNS:  Blood pressure (!) 142/79, pulse 79, temperature 98.4 F (36.9 C), temperature source Oral, resp. rate 18, SpO2 100 %.  PHYSICAL EXAMINATION:  Physical Exam  GENERAL:  62 y.o.-year-old patient lying in the bed with no acute distress.  EYES: Pupils equal, round, reactive to light and accommodation. No scleral icterus. Extraocular muscles intact.  HEENT: Head atraumatic, normocephalic. Oropharynx and nasopharynx clear.  Neck lymphadenopathy. NECK:  Supple, no jugular venous distention. No thyroid enlargement, no tenderness.  LUNGS: Normal breath sounds bilaterally, no wheezing, rales,rhonchi or crepitation. No use of accessory muscles of respiration.  CARDIOVASCULAR: S1, S2 normal. No murmurs, rubs, or gallops.  ABDOMEN: Soft, nontender, nondistended. Bowel sounds present. No organomegaly or mass.  EXTREMITIES: No pedal edema, cyanosis, or clubbing.  NEUROLOGIC: Cranial nerves II through XII are intact. Muscle strength 5/5 in all extremities. Sensation intact. Gait not checked.  PSYCHIATRIC: The patient is alert and oriented x 3.  SKIN: No obvious rash, lesion, or ulcer.   LABORATORY PANEL:   CBC Recent Labs  Lab 01/02/18 0945  WBC 10.5  HGB 13.7  HCT 41.2  PLT 185   ------------------------------------------------------------------------------------------------------------------  Chemistries  Recent Labs  Lab 01/02/18 0945  NA 133*  K 4.7  CL 100*  CO2 19*  GLUCOSE 143*  BUN 98*  CREATININE 7.44*  CALCIUM 8.5*  AST 22  ALT 30  ALKPHOS 72  BILITOT 0.7   ------------------------------------------------------------------------------------------------------------------  Cardiac Enzymes No results for input(s): TROPONINI in the last 168 hours. ------------------------------------------------------------------------------------------------------------------  RADIOLOGY:    No results found.    IMPRESSION AND PLAN:   Acute renal failure, possible due to chemotherapy. The patient is admitted directly from cancer center. Start IV fluids support and follow-up BMP.  Renal ultrasound and nephrology consult.  Hold ACE inhibitor.  Hyponatremia.  Start normal saline IV in the follow-up BMP.  Hypertension, hold irbesartan, start Norvasc.  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  Discussed with Dr. Juleen China. All the records are reviewed and case discussed with ED provider. Management plans discussed with the patient, family and they are in agreement.  CODE STATUS: Full code  TOTAL TIME TAKING CARE OF THIS PATIENT: 53 minutes.    Demetrios Loll M.D on 01/02/2018 at 5:20 PM  Between 7am to 6pm - Pager - [phone]  After 6pm go to [email or web address] - Proofreader  [facility]  Office  [phone]  CC: Primary care physician; Albina Billet, MD   Note: This dictation was prepared with Dragon dictation along with smaller phrase technology. Any transcriptional errors that result from this process are unin

## 2018-01-02 NOTE — Plan of Care (Signed)
Direct admission from the Cancer center. VSS. Pt denies n/v/pain. IVF infusing.

## 2018-01-03 ENCOUNTER — Ambulatory Visit: Payer: BLUE CROSS/BLUE SHIELD

## 2018-01-03 ENCOUNTER — Inpatient Hospital Stay: Payer: BLUE CROSS/BLUE SHIELD

## 2018-01-03 ENCOUNTER — Ambulatory Visit
Admission: RE | Admit: 2018-01-03 | Discharge: 2018-01-03 | Disposition: A | Discharge status: Home / Self Care | Payer: BLUE CROSS/BLUE SHIELD | Source: Ambulatory Visit | Attending: Radiation Oncology | Admitting: Radiation Oncology

## 2018-01-03 DIAGNOSIS — N179 Acute kidney failure, unspecified: Principal | ICD-10-CM

## 2018-01-03 DIAGNOSIS — C109 Malignant neoplasm of oropharynx, unspecified: Secondary | ICD-10-CM

## 2018-01-03 DIAGNOSIS — D649 Anemia, unspecified: Secondary | ICD-10-CM

## 2018-01-03 LAB — CBC
HCT: 37.4 % — ABNORMAL LOW (ref 40.0–52.0)
HEMOGLOBIN: 12.5 g/dL — AB (ref 13.0–18.0)
MCH: 29.8 pg (ref 26.0–34.0)
MCHC: 33.6 g/dL (ref 32.0–36.0)
MCV: 88.7 fL (ref 80.0–100.0)
PLATELETS: 156 10*3/uL (ref 150–440)
RBC: 4.21 MIL/uL — AB (ref 4.40–5.90)
RDW: 13.4 % (ref 11.5–14.5)
WBC: 9.9 10*3/uL (ref 3.8–10.6)

## 2018-01-03 LAB — BASIC METABOLIC PANEL
ANION GAP: 10 (ref 5–15)
BUN: 95 mg/dL — ABNORMAL HIGH (ref 6–20)
CHLORIDE: 108 mmol/L (ref 101–111)
CO2: 20 mmol/L — AB (ref 22–32)
Calcium: 8.2 mg/dL — ABNORMAL LOW (ref 8.9–10.3)
Creatinine, Ser: 7.03 mg/dL — ABNORMAL HIGH (ref 0.61–1.24)
GFR calc Af Amer: 9 mL/min — ABNORMAL LOW (ref >60)
GFR calc non Af Amer: 7 mL/min — ABNORMAL LOW (ref >60)
Glucose, Bld: 97 mg/dL (ref 65–99)
Potassium: 5.2 mmol/L — ABNORMAL HIGH (ref 3.5–5.1)
SODIUM: 138 mmol/L (ref 135–145)

## 2018-01-03 MED ORDER — SODIUM POLYSTYRENE SULFONATE 15 GM/60ML PO SUSP
15.0000 g | Freq: Once | ORAL | Status: AC
  Administered 2018-01-03: 15 g via ORAL
  Filled 2018-01-03: qty 60

## 2018-01-03 NOTE — Consult Note (Signed)
[facility]  CONSULT NOTE    Date: 01/03/2018                  Patient Name:  Ivan Garcia [facility].  MRN: [id number]  DOB: 1955/12/13  Age / Sex: 62 y.o., male         PCP: Albina Billet, MD                 Service Requesting Consult: Dr. Bridgett Larsson                 Reason for Consult: Acute Renal Failure            History of Present Illness: Mr. Ivan Keller Gaberial Cada. is a 62 y.o. white male with head and neck cancer, hypertension, hyperlipidemia, who was admitted to [facility] on 01/02/2018 for Acute kidney injury    Patient received cisplatin and got a CT with IV contrast on 1/28.   Creatinine 7.44 on admission, baseline of 1.15   Medications: Outpatient medications: Medications Prior to Admission  Medication Sig Dispense Refill Last Dose  . irbesartan (AVAPRO) 150 MG tablet Take 150 mg by mouth daily.  2 01/02/2018 at Unknown time  . lansoprazole (PREVACID) 30 MG capsule Take 1 capsule (30 mg total) by mouth daily at 12 noon. 30 capsule 4 01/01/2018 at Unknown time  . Multiple Vitamins-Minerals (MULTIVITAMIN WITH MINERALS) tablet Take 1 tablet by mouth daily.   Past Week at Unknown time  . ondansetron (ZOFRAN) 8 MG tablet Take 1 tablet (8 mg total) by mouth 2 (two) times daily as needed. 30 tablet 1 01/01/2018 at Unknown time  . rosuvastatin (CRESTOR) 10 MG tablet Take 10 mg by mouth every evening.  4 01/01/2018 at Unknown time  . docusate sodium (COLACE) 100 MG capsule Take 100 mg by mouth 2 (two) times daily as needed for mild constipation.   Taking  . lidocaine-prilocaine (EMLA) cream Apply to affected area once 30 g 3 Taking  . oxyCODONE-acetaminophen (PERCOCET/ROXICET) 5-325 MG tablet Take 1 tablet by mouth every 6 (six) hours as needed for severe pain. 56 tablet 0 Taking  . prochlorperazine (COMPAZINE) 10 MG tablet Take 1 tablet (10 mg total) by mouth every 6 (six) hours as needed (Nausea or vomiting). 30 tablet 1 Taking    Current medications: Current  Facility-Administered Medications  Medication Dose Route Frequency Provider Last Rate Last Dose  . 0.9 %  sodium chloride infusion   Intravenous Continuous Demetrios Loll, MD 125 mL/hr at 01/03/18 [phone]    . acetaminophen (TYLENOL) tablet 650 mg  650 mg Oral Q6H PRN Demetrios Loll, MD       Or  . acetaminophen (TYLENOL) suppository 650 mg  650 mg Rectal Q6H PRN Demetrios Loll, MD      . albuterol (PROVENTIL) (2.5 MG/3ML) 0.083% nebulizer solution 2.5 mg  2.5 mg Nebulization Q2H PRN Demetrios Loll, MD      . bisacodyl (DULCOLAX) EC tablet 5 mg  5 mg Oral Daily PRN Demetrios Loll, MD      . heparin injection 5,000 Units  5,000 Units Subcutaneous Q8H Demetrios Loll, MD   5,000 Units at 01/03/18 [phone]  . HYDROcodone-acetaminophen (NORCO/VICODIN) 5-325 MG per tablet 1-2 tablet  1-2 tablet Oral Q4H PRN Demetrios Loll, MD      . ondansetron [facility]) tablet 4 mg  4 mg Oral Q6H PRN Demetrios Loll, MD       Or  . ondansetron [facility]) injection 4 mg  4  mg Intravenous Q6H PRN Demetrios Loll, MD      . pantoprazole (PROTONIX) EC tablet 40 mg  40 mg Oral Daily Demetrios Loll, MD   40 mg at 01/02/18 1615  . senna-docusate (Senokot-S) tablet 1 tablet  1 tablet Oral QHS PRN Demetrios Loll, MD          Allergies: No Known Allergies    Past Medical History: Past Medical History:  Diagnosis Date  . Arthritis   . Cancer ([place])    Head and neck cancer  . Hemorrhoids   . Hyperlipidemia   . Hypertension      Past Surgical History: Past Surgical History:  Procedure Laterality Date  . COLONOSCOPY WITH PROPOFOL N/A 04/04/2017   Procedure: COLONOSCOPY WITH PROPOFOL;  Surgeon: Robert Bellow, MD;  Location: [facility] ENDOSCOPY;  Service: Endoscopy;  Laterality: N/A;  . MANDIBLE SURGERY    . PORTA CATH INSERTION N/A 12/19/2017   Procedure: PORTA CATH INSERTION;  Surgeon: Algernon Huxley, MD;  Location: [place] CV LAB;  Service: Cardiovascular;  Laterality: N/A;  . TONSILLECTOMY       Family History: Family History  Problem Relation Age of Onset   . Breast cancer Mother   . Asthma Mother   . Congestive Heart Failure Mother   . Prostate cancer Father   . Brain cancer Father   . Bladder Cancer Father      Social History: Social History   Socioeconomic History  . Marital status: Single    Spouse name: Not on file  . Number of children: Not on file  . Years of education: Not on file  . Highest education level: Not on file  Social Needs  . Financial resource strain: Not on file  . Food insecurity - worry: Not on file  . Food insecurity - inability: Not on file  . Transportation needs - medical: Not on file  . Transportation needs - non-medical: Not on file  Occupational History  . Not on file  Tobacco Use  . Smoking status: Never Smoker  . Smokeless tobacco: Never Used  Substance and Sexual Activity  . Alcohol use: No  . Drug use: No  . Sexual activity: Not on file  Other Topics Concern  . Not on file  Social History Narrative  . Not on file     Review of Systems: Review of Systems  Constitutional: Negative.  Negative for chills, diaphoresis, fever, malaise/fatigue and weight loss.  HENT: Negative.  Negative for congestion, ear discharge, ear pain, hearing loss, nosebleeds, sinus pain, sore throat and tinnitus.   Eyes: Negative.  Negative for blurred vision, double vision, photophobia, pain, discharge and redness.  Respiratory: Negative.  Negative for cough, hemoptysis, sputum production, shortness of breath, wheezing and stridor.   Cardiovascular: Negative.  Negative for chest pain, palpitations, orthopnea, claudication, leg swelling and PND.  Gastrointestinal: Negative.  Negative for abdominal pain, blood in stool, constipation, diarrhea, heartburn, melena, nausea and vomiting.  Genitourinary: Negative.  Negative for dysuria, flank pain, frequency, hematuria and urgency.  Musculoskeletal: Negative.  Negative for back pain, falls, joint pain, myalgias and neck pain.  Skin: Negative.  Negative for itching and  rash.  Neurological: Negative.  Negative for dizziness, tingling, tremors, sensory change, speech change, focal weakness, seizures, loss of consciousness, weakness and headaches.  Endo/Heme/Allergies: Negative.  Negative for environmental allergies and polydipsia. Does not bruise/bleed easily.  Psychiatric/Behavioral: Negative.  Negative for depression, hallucinations, memory loss, substance abuse and suicidal ideas. The patient is not nervous/anxious  and does not have insomnia.     Vital Signs: Blood pressure 139/83, pulse 76, temperature 98.4 F (36.9 C), temperature source Oral, resp. rate 18, height 5\' 9"  (1.753 m), weight 99.3 kg (219 lb), SpO2 98 %.  Weight trends: Filed Weights   01/02/18 1700  Weight: 99.3 kg (219 lb)    Physical Exam: General: NAD,   Head: Normocephalic, atraumatic. Moist oral mucosal membranes  Eyes: Anicteric, PERRL  Neck: Right parotid mass, +LAD  Lungs:  Clear to auscultation  Heart: Regular rate and rhythm  Abdomen:  Soft, nontender,   Extremities: No peripheral edema.  Neurologic: Nonfocal, moving all four extremities  Skin: No lesions         Lab results: Basic Metabolic Panel: Recent Labs  Lab 01/02/18 0945 01/03/18 0428  NA 133* 138  K 4.7 5.2*  CL 100* 108  CO2 19* 20*  GLUCOSE 143* 97  BUN 98* 95*  CREATININE 7.44* 7.03*  CALCIUM 8.5* 8.2*    Liver Function Tests: Recent Labs  Lab 01/02/18 0945  AST 22  ALT 30  ALKPHOS 72  BILITOT 0.7  PROT 6.7  ALBUMIN 3.4*   No results for input(s): LIPASE, AMYLASE in the last 168 hours. No results for input(s): AMMONIA in the last 168 hours.  CBC: Recent Labs  Lab 01/02/18 0945 01/03/18 0428  WBC 10.5 9.9  NEUTROABS 8.9*  --   HGB 13.7 12.5*  HCT 41.2 37.4*  MCV 89.1 88.7  PLT 185 156    Cardiac Enzymes: No results for input(s): CKTOTAL, CKMB, CKMBINDEX, TROPONINI in the last 168 hours.  BNP: Invalid input(s): POCBNP  CBG: No results for input(s): GLUCAP in the  last 168 hours.  Microbiology: No results found for this or any previous visit.  Coagulation Studies: No results for input(s): LABPROT, INR in the last 72 hours.  Urinalysis: No results for input(s): COLORURINE, LABSPEC, PHURINE, GLUCOSEU, HGBUR, BILIRUBINUR, KETONESUR, PROTEINUR, UROBILINOGEN, NITRITE, LEUKOCYTESUR in the last 72 hours.  Invalid input(s): APPERANCEUR    Imaging:  No results found.   Assessment & Plan: Mr. Ivan Garcia. is a 62 y.o. white male with head and neck cancer, hypertension, hyperlipidemia, who was admitted to [facility] on 01/02/2018 for Acute kidney injury    1. Acute renal failure: with baseline creatinine of 1.15, GFR of >60 on 12/19/17. Acute renal failure secondary to IV contrast and cisplatin exposure.  - Continue NS - nonoliguric urine output.  - no uremic symptoms.  - No acute indication for dialysis  LOS: 1 Ivan Garcia 1/31/20198:43 AM

## 2018-01-03 NOTE — Consult Note (Signed)
Urology Consult  I have been asked to see the patient by Dr. Margaretmary Eddy, for evaluation and management of an incidentally discovered bladder mass.  Chief Complaint: Kidney failure  History of Present Illness: Ivan Garcia. is a 62 y.o. year old admitted for acute renal failure felt secondary to exposure to IV contrast and cisplatin for head neck cancer.  A renal ultrasound performed today showed bilateral parapelvic cysts and prostate enlargement.  A questionable mass at the base of the bladder was identified and consult was requested.  He had no baseline voiding complaints and denies gross hematuria.  There is no previous history of urologic problems or prior urologic evaluation.  Past Medical History:  Diagnosis Date  . Arthritis   . Cancer ([place])    Head and neck cancer  . Hemorrhoids   . Hyperlipidemia   . Hypertension     Past Surgical History:  Procedure Laterality Date  . COLONOSCOPY WITH PROPOFOL N/A 04/04/2017   Procedure: COLONOSCOPY WITH PROPOFOL;  Surgeon: Robert Bellow, MD;  Location: [facility] ENDOSCOPY;  Service: Endoscopy;  Laterality: N/A;  . MANDIBLE SURGERY    . PORTA CATH INSERTION N/A 12/19/2017   Procedure: PORTA CATH INSERTION;  Surgeon: Algernon Huxley, MD;  Location: [place] CV LAB;  Service: Cardiovascular;  Laterality: N/A;  . TONSILLECTOMY      Home Medications:  Current Meds  Medication Sig  . irbesartan (AVAPRO) 150 MG tablet Take 150 mg by mouth daily.  . lansoprazole (PREVACID) 30 MG capsule Take 1 capsule (30 mg total) by mouth daily at 12 noon.  . Multiple Vitamins-Minerals (MULTIVITAMIN WITH MINERALS) tablet Take 1 tablet by mouth daily.  . ondansetron (ZOFRAN) 8 MG tablet Take 1 tablet (8 mg total) by mouth 2 (two) times daily as needed.  . rosuvastatin (CRESTOR) 10 MG tablet Take 10 mg by mouth every evening.    Allergies: No Known Allergies  Family History  Problem Relation Age of Onset  . Breast cancer Mother   . Asthma Mother    . Congestive Heart Failure Mother   . Prostate cancer Father   . Brain cancer Father   . Bladder Cancer Father     Social History:  reports that  has never smoked. he has never used smokeless tobacco. He reports that he does not drink alcohol or use drugs.  ROS: A complete review of systems was performed.  All systems are negative except for pertinent findings as noted.  Physical Exam:  Vital signs in last 24 hours: Temp:  [98 F (36.7 C)-98.7 F (37.1 C)] 98 F (36.7 C) (01/31 0920) Pulse Rate:  [67-78] 67 (01/31 0920) Resp:  [13-18] 13 (01/31 0920) BP: (135-142)/(78-83) 135/83 (01/31 0920) SpO2:  [98 %-100 %] 100 % (01/31 0920) Weight:  [219 lb (99.3 kg)] 219 lb (99.3 kg) (01/30 1700) Constitutional:  Alert and oriented, No acute distress   Laboratory Data:  Recent Labs    01/02/18 0945 01/03/18 0428  WBC 10.5 9.9  HGB 13.7 12.5*  HCT 41.2 37.4*   Recent Labs    01/02/18 0945 01/03/18 0428  NA 133* 138  K 4.7 5.2*  CL 100* 108  CO2 19* 20*  GLUCOSE 143* 97  BUN 98* 95*  CREATININE 7.44* 7.03*  CALCIUM 8.5* 8.2*    Radiologic Imaging: US Renal  Result Date: 01/03/2018 CLINICAL DATA:  Acute renal failure, history hypertension EXAM: RENAL / URINARY TRACT ULTRASOUND COMPLETE COMPARISON:  PET-CT 12/13/2017 FINDINGS: Right  Kidney: Length: 13.7 cm. Cystic lesion at upper pole 4.1 x 3.7 x 4.0 cm. Normal cortical thickness and echogenicity. Fluid collections centrally within the RIGHT kidney likely represent peripelvic renal cysts when compared to the prior CT exam rather than hydronephrosis. No additional mass, definite hydronephrosis, or shadowing calculi. Left Kidney: Length: 12.8 cm. Normal cortical thickness and echogenicity. Fluid collections at the central LEFT kidney likely represent peripelvic renal cysts when correlated with the previous PET-CT rather than hydronephrosis. No definite solid mass, hydronephrosis or shadowing calcification. Bladder: Prostatic  enlargement, gland measuring 3.8 x 4.1 x 4.1 cm. Questionable bladder base mass mass versus indentation of the bladder base by prostatic enlargement. IMPRESSION: Fluid collections are seen at the renal sinuses and renal pelvis bilaterally which likely represent peripelvic renal cysts rather than hydronephrosis when correlated with the prior PET-CT exam. Additional cyst at upper pole RIGHT kidney 4.1 cm greatest size. Prostatic enlargement with question extension of prostate gland into bladder base versus small bladder base mass; recommend cystoscopy to exclude bladder base tumor. Electronically Signed   By: Lavonia Dana M.D.   On: 01/03/2018 10:38    Impression/Assessment:  Renal ultrasound reviewed and pathologic bladder mass unlikely.  This most likely represents bladder base elevation from an enlarged prostate.  Plan:  Schedule routine office cystoscopy as an outpatient.  01/03/2018, 4:53 PM  John Giovanni,  MD   Thank you for involving me in this patient's care.  Please page me for any additional concerns  Abbie Sons

## 2018-01-03 NOTE — Progress Notes (Signed)
[facility] [place] NAME: Ivan Garcia    MR#:  [id number]  DATE OF BIRTH:  02/22/1956  SUBJECTIVE:  CHIEF COMPLAINT: Patient is resting comfortably.  Asymptomatic.  REVIEW OF SYSTEMS:  CONSTITUTIONAL: No fever, fatigue or weakness.  EYES: No blurred or double vision.  EARS, NOSE, AND THROAT: No tinnitus or ear pain.  RESPIRATORY: No cough, shortness of breath, wheezing or hemoptysis.  CARDIOVASCULAR: No chest pain, orthopnea, edema.  GASTROINTESTINAL: No nausea, vomiting, diarrhea or abdominal pain.  GENITOURINARY: No dysuria, hematuria.  ENDOCRINE: No polyuria, nocturia,  HEMATOLOGY: No anemia, easy bruising or bleeding SKIN: No rash or lesion. MUSCULOSKELETAL: No joint pain or arthritis.   NEUROLOGIC: No tingling, numbness, weakness.  PSYCHIATRY: No anxiety or depression.   DRUG ALLERGIES:  No Known Allergies  VITALS:  Blood pressure (!) 149/84, pulse 77, temperature 97.9 F (36.6 C), temperature source Oral, resp. rate 13, height 5\' 9"  (1.753 m), weight 99.3 kg (219 lb), SpO2 99 %.  PHYSICAL EXAMINATION:  GENERAL:  62 y.o.-year-old patient lying in the bed with no acute distress.  EYES: Pupils equal, round, reactive to light and accommodation. No scleral icterus. Extraocular muscles intact.  HEENT: Head atraumatic, normocephalic. Oropharynx and nasopharynx clear.  Right lateral aspect of the neck with mass NECK:  Supple, no jugular venous distention. No thyroid enlargement, no tenderness.  LUNGS: Normal breath sounds bilaterally, no wheezing, rales,rhonchi or crepitation. No use of accessory muscles of respiration.  CARDIOVASCULAR: S1, S2 normal. No murmurs, rubs, or gallops.  ABDOMEN: Soft, nontender, nondistended. Bowel sounds present. No organomegaly or mass.  EXTREMITIES: No pedal edema, cyanosis, or clubbing.  NEUROLOGIC: Cranial nerves II through XII are intact. Muscle strength 5/5 in all extremities. Sensation  intact. Gait not checked.  PSYCHIATRIC: The patient is alert and oriented x 3.  SKIN: No obvious rash, lesion, or ulcer.    LABORATORY PANEL:   CBC Recent Labs  Lab 01/03/18 0428  WBC 9.9  HGB 12.5*  HCT 37.4*  PLT 156   ------------------------------------------------------------------------------------------------------------------  Chemistries  Recent Labs  Lab 01/02/18 0945 01/03/18 0428  NA 133* 138  K 4.7 5.2*  CL 100* 108  CO2 19* 20*  GLUCOSE 143* 97  BUN 98* 95*  CREATININE 7.44* 7.03*  CALCIUM 8.5* 8.2*  AST 22  --   ALT 30  --   ALKPHOS 72  --   BILITOT 0.7  --    ------------------------------------------------------------------------------------------------------------------  Cardiac Enzymes No results for input(s): TROPONINI in the last 168 hours. ------------------------------------------------------------------------------------------------------------------  RADIOLOGY:  US Renal  Result Date: 01/03/2018 CLINICAL DATA:  Acute renal failure, history hypertension EXAM: RENAL / URINARY TRACT ULTRASOUND COMPLETE COMPARISON:  PET-CT 12/13/2017 FINDINGS: Right Kidney: Length: 13.7 cm. Cystic lesion at upper pole 4.1 x 3.7 x 4.0 cm. Normal cortical thickness and echogenicity. Fluid collections centrally within the RIGHT kidney likely represent peripelvic renal cysts when compared to the prior CT exam rather than hydronephrosis. No additional mass, definite hydronephrosis, or shadowing calculi. Left Kidney: Length: 12.8 cm. Normal cortical thickness and echogenicity. Fluid collections at the central LEFT kidney likely represent peripelvic renal cysts when correlated with the previous PET-CT rather than hydronephrosis. No definite solid mass, hydronephrosis or shadowing calcification. Bladder: Prostatic enlargement, gland measuring 3.8 x 4.1 x 4.1 cm. Questionable bladder base mass mass versus indentation of the bladder base by prostatic enlargement. IMPRESSION:  Fluid collections are seen at the renal sinuses and renal pelvis bilaterally which likely represent peripelvic  renal cysts rather than hydronephrosis when correlated with the prior PET-CT exam. Additional cyst at upper pole RIGHT kidney 4.1 cm greatest size. Prostatic enlargement with question extension of prostate gland into bladder base versus small bladder base mass; recommend cystoscopy to exclude bladder base tumor. Electronically Signed   By: Lavonia Dana M.D.   On: 01/03/2018 10:38    EKG:  No orders found for this or any previous visit.  ASSESSMENT AND PLAN:    #Acute renal failure, possible due to chemotherapy from cisplatin Hydrate with IV fluids  follow-up BMP.    Creatinine was at 1.15 on January 23rd 2019.  Following chemotherapy it went up to 7.44-7.03 Renal ultrasound-prostatic enlargement with question extension of prostate gland into bladder base versus small bladder base mass recommending cystoscopy to exclude tumor nephrology and urology are consulted  Nephrology is recommending to continue IV fluids and not considering dialysis at this time no uremic symptoms  Hold ACE inhibitor. Avoid nephrotoxins and renal dose medications  Possible bladder mass per renal ultrasound Patient is seen by urology pathologic bladder mass is unlikely a per urology and recommending outpatient cystoscopy  Hyponatremia.   improved with normal saline.  Sodium at 138.    Hyperkalemia :potassium at 5.2 Kayexalate given repeat BMP in a.m  Hypertension, hold irbesartan, start Norvasc.  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 Tasia Catchings has seen the patient Status post 1 cycle of cisplatin      All the records are reviewed and case discussed with Care Management/Social Workerr. Management plans discussed with the patient, family and they are in agreement.  CODE STATUS: Full code  TOTAL TIME TAKING CARE OF THIS PATIENT: 26minutes.   POSSIBLE D/C IN 2-3 DAYS, DEPENDING ON  CLINICAL CONDITION.  Note: This dictation was prepared with Dragon dictation along with smaller phrase technology. Any transcriptional errors that result from this process are unintentional.   Nicholes Mango M.D on 01/03/2018 at 5:46 PM  Between 7am to 6pm - Pager - [phone] After 6pm go to [email or web address] - password [email or web address] [facility]  [facility]  Office  [phone]  CC: Primary care physician; Albina Billet, MD

## 2018-01-03 NOTE — Consult Note (Signed)
Hematology/Oncology Consult note [facility] Telephone:([phone] [phone]  Patient Care Team: Albina Billet, MD as PCP - General (Internal Medicine) Albina Billet, MD (Internal Medicine) Bary Castilla Forest Gleason, MD (General Surgery)   Name of the patient: Ivan Garcia  [id number]  04-19-1956   Date of visit: 01/03/18 REASON FOR COSULTATION:  Head and neck cancer. History of presenting illness-  This is a 62 year old male known to our service for treatment of newly diagnosed squamous carcinoma of oropharynx.  He is currently on concurrent chemotherapy with cis-platinum and radiation.  S/p cycle 1 Cisplatin 1 week ago.  Patient follow-up with me in the clinic yesterday reports doing well however labs showed acute increase of creatinine from normal to 7.4.  Patient was started on IV hydration and admit to the hospital for continuous fluid, and additional evaluation. Patient denies any difficulty with swallowing so far.  He reports drinking adequate fluid every day.  Overnight ultrasound kidney was done which showed fluid collections are seen at the renal sinuses in the renal pelvis bilaterally which likely represent peripelvic renal cysts rather than hydronephrosis.  Prostatic enlargement with question extension to prostate gland into bladder base versus small bladder base mass.  Recommend cystoscopy.  #Of note, he had CT with contrast study done on November 15, 2017, same report was scanned into epic's on December 31, 2017.  Patient did not receive any recent contrast study. Patient reports feeling fine.  Denies any chest pain, shortness of breath, lower extremity swelling.  He makes plenty of urine.  Appetite is good.  No nausea or vomiting or diarrhea..  Review of systems- Review of Systems  Constitutional: Negative for fever.  HENT: Negative for hearing loss.        Right neck mass  Eyes: Negative for pain.  Respiratory: Negative for cough and shortness  of breath.   Cardiovascular: Negative for chest pain.  Gastrointestinal: Negative for heartburn.  Genitourinary: Negative for dysuria and urgency.  Musculoskeletal: Negative for myalgias.  Skin: Negative for rash.  Neurological: Negative for dizziness.  Endo/Heme/Allergies: Does not bruise/bleed easily.  Psychiatric/Behavioral: The patient is not nervous/anxious.     No Known Allergies  Patient Active Problem List   Diagnosis Date Noted  . ARF (acute renal failure) ([place]) 01/02/2018  . 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[place]) 12/14/2017  . Goals of care, counseling/discussion 12/10/2017  . Encounter for screening colonoscopy 02/28/2017     Past Medical History:  Diagnosis Date  . Arthritis   . Cancer ([place])    Head and neck cancer  . Hemorrhoids   . Hyperlipidemia   . Hypertension      Past Surgical History:  Procedure Laterality Date  . COLONOSCOPY WITH PROPOFOL N/A 04/04/2017   Procedure: COLONOSCOPY WITH PROPOFOL;  Surgeon: Robert Bellow, MD;  Location: [facility] ENDOSCOPY;  Service: Endoscopy;  Laterality: N/A;  . MANDIBLE SURGERY    . PORTA CATH INSERTION N/A 12/19/2017   Procedure: PORTA CATH INSERTION;  Surgeon: Algernon Huxley, MD;  Location: [facility] CV LAB;  Service: Cardiovascular;  Laterality: N/A;  . TONSILLECTOMY      Social History   Socioeconomic History  . Marital status: Single    Spouse name: Not on file  . Number of children: Not on file  . Years of education: Not on file  . Highest education level: Not on file  Social Needs  . Financial resource strain: Not on file  . Food insecurity - worry: Not on  file  . Food insecurity - inability: Not on file  . Transportation needs - medical: Not on file  . Transportation needs - non-medical: Not on file  Occupational History  . Not on file  Tobacco Use  . Smoking status: Never Smoker  . Smokeless tobacco: Never Used  Substance and Sexual Activity  . Alcohol use: No  . Drug use: No  . Sexual  activity: Not on file  Other Topics Concern  . Not on file  Social History Narrative  . Not on file     Family History  Problem Relation Age of Onset  . Breast cancer Mother   . Asthma Mother   . Congestive Heart Failure Mother   . Prostate cancer Father   . Brain cancer Father   . Bladder Cancer Father      Current Facility-Administered Medications:  .  0.9 %  sodium chloride infusion, , Intravenous, Continuous, Demetrios Loll, MD, Last Rate: 125 mL/hr at 01/03/18 1436 .  acetaminophen (TYLENOL) tablet 650 mg, 650 mg, Oral, Q6H PRN **OR** acetaminophen (TYLENOL) suppository 650 mg, 650 mg, Rectal, Q6H PRN, Demetrios Loll, MD .  albuterol (PROVENTIL) (2.5 MG/3ML) 0.083% nebulizer solution 2.5 mg, 2.5 mg, Nebulization, Q2H PRN, Demetrios Loll, MD .  bisacodyl (DULCOLAX) EC tablet 5 mg, 5 mg, Oral, Daily PRN, Demetrios Loll, MD .  heparin injection 5,000 Units, 5,000 Units, Subcutaneous, Q8H, Demetrios Loll, MD, 5,000 Units at 01/03/18 1416 .  HYDROcodone-acetaminophen (NORCO/VICODIN) 5-325 MG per tablet 1-2 tablet, 1-2 tablet, Oral, Q4H PRN, Demetrios Loll, MD .  ondansetron [facility]) tablet 4 mg, 4 mg, Oral, Q6H PRN **OR** ondansetron (ZOFRAN) injection 4 mg, 4 mg, Intravenous, Q6H PRN, Demetrios Loll, MD .  pantoprazole (PROTONIX) EC tablet 40 mg, 40 mg, Oral, Daily, Demetrios Loll, MD, 40 mg at 01/03/18 8756 .  senna-docusate (Senokot-S) tablet 1 tablet, 1 tablet, Oral, QHS PRN, Demetrios Loll, MD   Physical exam:  Vitals:   01/02/18 1942 01/03/18 0333 01/03/18 0920 01/03/18 1741  BP: (!) 142/78 139/83 135/83 (!) 149/84  Pulse: 78 76 67 77  Resp: 18 18 13    Temp: 98.7 F (37.1 C) 98.4 F (36.9 C) 98 F (36.7 C) 97.9 F (36.6 C)  TempSrc: Oral Oral Oral Oral  SpO2: 100% 98% 100% 99%  Weight:      Height:       Constitutional: He is oriented to person, place, and time and well-developed, well-nourished, and in no distress. No distress.  HENT:  Head: Normocephalic and atraumatic.  Left Ear: External ear  normal.  Mouth/Throat: Oropharynx is clear and moist. No oropharyngeal exudate.  Eyes: Conjunctivae and EOM are normal. Pupils are equal, round, and reactive to light. Left eye exhibits no discharge. No scleral icterus.  Neck: Normal range of motion. Neck supple. No JVD present. No thyromegaly present.  Right side neck mass. Tongue base mass.  Cardiovascular: Normal rate and regular rhythm.  No murmur heard. Pulmonary/Chest: Breath sounds normal. He has no wheezes. He has no rales. He exhibits no tenderness.  Abdominal: Soft. Bowel sounds are normal. He exhibits no distension. There is no tenderness.  Musculoskeletal: Normal range of motion. He exhibits no edema or tenderness.  Lymphadenopathy:    He has right side cervical adenopathy.  Neurological: He is alert and oriented to person, place, and time. No cranial nerve deficit. Gait normal.  Skin: Skin is dry. No rash noted. He is not diaphoretic. No erythema.  Psychiatric: Memory and affect normal.  CMP Latest Ref Rng & Units 01/03/2018  Glucose 65 - 99 mg/dL 97  BUN 6 - 20 mg/dL 95(H)  Creatinine 0.61 - 1.24 mg/dL 7.03(H)  Sodium 135 - 145 mmol/L 138  Potassium 3.5 - 5.1 mmol/L 5.2(H)  Chloride 101 - 111 mmol/L 108  CO2 22 - 32 mmol/L 20(L)  Calcium 8.9 - 10.3 mg/dL 8.2(L)  Total Protein 6.5 - 8.1 g/dL -  Total Bilirubin 0.3 - 1.2 mg/dL -  Alkaline Phos 38 - 126 U/L -  AST 15 - 41 U/L -  ALT 17 - 63 U/L -   CBC Latest Ref Rng & Units 01/03/2018  WBC 3.8 - 10.6 K/uL 9.9  Hemoglobin 13.0 - 18.0 g/dL 12.5(L)  Hematocrit 40.0 - 52.0 % 37.4(L)  Platelets 150 - 440 K/uL 156    US Renal  Result Date: 01/03/2018 CLINICAL DATA:  Acute renal failure, history hypertension EXAM: RENAL / URINARY TRACT ULTRASOUND COMPLETE COMPARISON:  PET-CT 12/13/2017 FINDINGS: Right Kidney: Length: 13.7 cm. Cystic lesion at upper pole 4.1 x 3.7 x 4.0 cm. Normal cortical thickness and echogenicity. Fluid collections centrally within the RIGHT  kidney likely represent peripelvic renal cysts when compared to the prior CT exam rather than hydronephrosis. No additional mass, definite hydronephrosis, or shadowing calculi. Left Kidney: Length: 12.8 cm. Normal cortical thickness and echogenicity. Fluid collections at the central LEFT kidney likely represent peripelvic renal cysts when correlated with the previous PET-CT rather than hydronephrosis. No definite solid mass, hydronephrosis or shadowing calcification. Bladder: Prostatic enlargement, gland measuring 3.8 x 4.1 x 4.1 cm. Questionable bladder base mass mass versus indentation of the bladder base by prostatic enlargement. IMPRESSION: Fluid collections are seen at the renal sinuses and renal pelvis bilaterally which likely represent peripelvic renal cysts rather than hydronephrosis when correlated with the prior PET-CT exam. Additional cyst at upper pole RIGHT kidney 4.1 cm greatest size. Prostatic enlargement with question extension of prostate gland into bladder base versus small bladder base mass; recommend cystoscopy to exclude bladder base tumor. Electronically Signed   By: Lavonia Dana M.D.   On: 01/03/2018 10:38   Nm Pet Image Initial (pi) Skull Base To Thigh  Result Date: 12/13/2017 CLINICAL DATA:  Initial treatment strategy for oropharyngeal neoplasm. EXAM: NUCLEAR MEDICINE PET SKULL BASE TO THIGH TECHNIQUE: 11.5 mCi F-18 FDG was injected intravenously. Full-ring PET imaging was performed from the skull base to thigh after the radiotracer. CT data was obtained and used for attenuation correction and anatomic localization. FASTING BLOOD GLUCOSE:  Value: 89 mg/dl COMPARISON:  Neck CT 11/15/2017 FINDINGS: NECK: Marked hypermetabolism noted in the region of the oropharynx/tounge base on the left side correlating with asymmetric soft tissue density with mass effect. The SUV max is 14.15. Associated right level 2 lymph node measuring 13.5 mm with SUV max of 5.4 Large necrotic nodal mass spanning  level 3, 4 and 5 on the right side is markedly hypermetabolic with SUV max of 66.2. No left-sided hypermetabolic lymphadenopathy. CHEST: No hypermetabolic mediastinal or hilar nodes. No suspicious pulmonary nodules on the CT scan. ABDOMEN/PELVIS: No abnormal hypermetabolic activity within the liver, pancreas, adrenal glands, or spleen. No hypermetabolic lymph nodes in the abdomen or pelvis. SKELETON: No focal hypermetabolic activity to suggest skeletal metastasis. IMPRESSION: 1. Primary hypermetabolic mucosal lesion in the left oropharynx/tongue base with right-sided bulky multilevel lymphadenopathy. 2. No findings for metastatic disease involving the chest, abdomen, pelvis or bony structures. Electronically Signed   By: Marijo Sanes M.D.   On: 12/13/2017 12:24  Assessment and plan- Patient is a 62 y.o. male with history of squamous carcinoma of oropharynx, currently on concurrent chemo and radiation, status post cycle 1 cisplatin, presented with acute kidney failure.  #Acute kidney failure likely secondary to nephrotoxicity of cis-platinum.  Agree with continue IV fluid. Ultrasound renal results reviewed.  Questionable prostate enlargement extending to the base of bladder versus small bladder mass.  Appreciate urology input.  Recent PET scan done for head and neck cancer did not reveal any increased metabolic activity in the abdomen or pelvis.  Bladder lesion may not be visualized well due to physiologic increased activity in the bladder.  #Squamous carcinoma of oropharynx status post 1 dose of cisplatin.  No plan for inpatient chemotherapy.  Patient may continue his daily radiation for treatment of head and neck cancer.  #Mild anemia likely due to volume dilution.  Continue monitor.  Earlie Server, MD, PhD Hematology Oncology [facility] Pager- [phone] 01/03/2018

## 2018-01-04 ENCOUNTER — Ambulatory Visit
Admission: RE | Admit: 2018-01-04 | Discharge: 2018-01-04 | Disposition: A | Discharge status: Home / Self Care | Payer: BLUE CROSS/BLUE SHIELD | Source: Ambulatory Visit | Attending: Radiation Oncology | Admitting: Radiation Oncology

## 2018-01-04 ENCOUNTER — Ambulatory Visit: Payer: BLUE CROSS/BLUE SHIELD

## 2018-01-04 DIAGNOSIS — R221 Localized swelling, mass and lump, neck: Secondary | ICD-10-CM | POA: Diagnosis not present

## 2018-01-04 LAB — BASIC METABOLIC PANEL
ANION GAP: 11 (ref 5–15)
BUN: 77 mg/dL — ABNORMAL HIGH (ref 6–20)
CO2: 20 mmol/L — ABNORMAL LOW (ref 22–32)
Calcium: 8.4 mg/dL — ABNORMAL LOW (ref 8.9–10.3)
Chloride: 108 mmol/L (ref 101–111)
Creatinine, Ser: 5.18 mg/dL — ABNORMAL HIGH (ref 0.61–1.24)
GFR calc non Af Amer: 11 mL/min — ABNORMAL LOW (ref >60)
GFR, EST AFRICAN AMERICAN: 12 mL/min — AB (ref >60)
Glucose, Bld: 151 mg/dL — ABNORMAL HIGH (ref 65–99)
POTASSIUM: 4.2 mmol/L (ref 3.5–5.1)
SODIUM: 139 mmol/L (ref 135–145)

## 2018-01-04 LAB — HIV ANTIBODY (ROUTINE TESTING W REFLEX): HIV Screen 4th Generation wRfx: NONREACTIVE

## 2018-01-04 NOTE — Progress Notes (Signed)
Hematology/Oncology Progress Note [facility] [phone] [phone]  Patient Care Team: Albina Billet, MD as PCP - General (Internal Medicine) Albina Billet, MD (Internal Medicine) Bary Castilla Forest Gleason, MD (General Surgery)   Name of the patient: Ivan Garcia  [id number]  05/25/1956  Date of visit: 01/04/18   INTERVAL HISTORY- Patient reports feeling well. Appetite is ok. Creatinine is improving.    Review of systems- Review of Systems  Constitutional: Negative for chills and fever.  HENT: Negative for hearing loss.   Eyes: Negative for pain.  Respiratory: Negative for cough.   Cardiovascular: Negative for chest pain.  Gastrointestinal: Negative for heartburn, nausea and vomiting.  Genitourinary: Negative for dysuria.  Musculoskeletal: Negative for myalgias.  Skin: Negative for rash.  Neurological: Negative for dizziness.  Endo/Heme/Allergies: Does not bruise/bleed easily.  Psychiatric/Behavioral: Negative for depression.    No Known Allergies  Patient Active Problem List   Diagnosis Date Noted  . ARF (acute renal failure) ([place]) 01/02/2018  . 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 Goals of care, counseling/discussion 12/10/2017  . Encounter for screening colonoscopy 02/28/2017     Past Medical History:  Diagnosis Date  . Arthritis   . Cancer ([place])    Head and neck cancer  . Hemorrhoids   . Hyperlipidemia   . Hypertension      Past Surgical History:  Procedure Laterality Date  . COLONOSCOPY WITH PROPOFOL N/A 04/04/2017   Procedure: COLONOSCOPY WITH PROPOFOL;  Surgeon: Robert Bellow, MD;  Location: [facility] ENDOSCOPY;  Service: Endoscopy;  Laterality: N/A;  . MANDIBLE SURGERY    . PORTA CATH INSERTION N/A 12/19/2017   Procedure: PORTA CATH INSERTION;  Surgeon: Algernon Huxley, MD;  Location: [facility] LAB;  Service: Cardiovascular;  Laterality: N/A;  . TONSILLECTOMY      Social History    Socioeconomic History  . Marital status: Single    Spouse name: Not on file  . Number of children: Not on file  . Years of education: Not on file  . Highest education level: Not on file  Social Needs  . Financial resource strain: Not on file  . Food insecurity - worry: Not on file  . Food insecurity - inability: Not on file  . Transportation needs - medical: Not on file  . Transportation needs - non-medical: Not on file  Occupational History  . Not on file  Tobacco Use  . Smoking status: Never Smoker  . Smokeless tobacco: Never Used  Substance and Sexual Activity  . Alcohol use: No  . Drug use: No  . Sexual activity: Not on file  Other Topics Concern  . Not on file  Social History Narrative  . Not on file     Family History  Problem Relation Age of Onset  . Breast cancer Mother   . Asthma Mother   . Congestive Heart Failure Mother   . Prostate cancer Father   . Brain cancer Father   . Bladder Cancer Father      Current Facility-Administered Medications:  .  0.9 %  sodium chloride infusion, , Intravenous, Continuous, Demetrios Loll, MD, Last Rate: 125 mL/hr at 01/04/18 2110 .  acetaminophen (TYLENOL) tablet 650 mg, 650 mg, Oral, Q6H PRN **OR** acetaminophen (TYLENOL) suppository 650 mg, 650 mg, Rectal, Q6H PRN, Demetrios Loll, MD .  albuterol (PROVENTIL) (2.5 MG/3ML) 0.083% nebulizer solution 2.5 mg, 2.5 mg, Nebulization, Q2H PRN, Demetrios Loll, MD .  bisacodyl (DULCOLAX) EC tablet 5  mg, 5 mg, Oral, Daily PRN, Demetrios Loll, MD .  heparin injection 5,000 Units, 5,000 Units, Subcutaneous, Q8H, Demetrios Loll, MD, 5,000 Units at 01/04/18 2110 .  HYDROcodone-acetaminophen (NORCO/VICODIN) 5-325 MG per tablet 1-2 tablet, 1-2 tablet, Oral, Q4H PRN, Demetrios Loll, MD .  ondansetron [facility]) tablet 4 mg, 4 mg, Oral, Q6H PRN **OR** ondansetron (ZOFRAN) injection 4 mg, 4 mg, Intravenous, Q6H PRN, Demetrios Loll, MD .  pantoprazole (PROTONIX) EC tablet 40 mg, 40 mg, Oral, Daily, Demetrios Loll, MD, 40 mg at  01/04/18 1210 .  senna-docusate (Senokot-S) tablet 1 tablet, 1 tablet, Oral, QHS PRN, Demetrios Loll, MD   Physical exam:  Vitals:   01/03/18 2024 01/04/18 0535 01/04/18 1457 01/04/18 1913  BP: 130/83 139/88 134/76 (!) 143/83  Pulse: 78 69 78 73  Resp: 18 18 18 16   Temp: 98.6 F (37 C) 98.1 F (36.7 C) 97.7 F (36.5 C) 98.2 F (36.8 C)  TempSrc: Oral  Oral Oral  SpO2: 100% 100% 100% 100%  Weight:      Height:       GENERAL:Alert, no distress and comfortable.  EYES: no pallor or icterus OROPHARYNX: no thrush or ulceration; NECK: supple, right side cervical adenopathy.  LUNGS: clear to auscultation and  No wheeze or crackles HEART/CVS: regular rate & rhythm and no murmurs; No lower extremity edema ABDOMEN: abdomen soft, non-tender and normal bowel sounds Musculoskeletal:no cyanosis of digits and no clubbing  PSYCH: alert & oriented x 3  NEURO: no focal motor/sensory deficits SKIN:  no rashes or significant lesions     CMP Latest Ref Rng & Units 01/04/2018  Glucose 65 - 99 mg/dL 151(H)  BUN 6 - 20 mg/dL 77(H)  Creatinine 0.61 - 1.24 mg/dL 5.18(H)  Sodium 135 - 145 mmol/L 139  Potassium 3.5 - 5.1 mmol/L 4.2  Chloride 101 - 111 mmol/L 108  CO2 22 - 32 mmol/L 20(L)  Calcium 8.9 - 10.3 mg/dL 8.4(L)  Total Protein 6.5 - 8.1 g/dL -  Total Bilirubin 0.3 - 1.2 mg/dL -  Alkaline Phos 38 - 126 U/L -  AST 15 - 41 U/L -  ALT 17 - 63 U/L -   CBC Latest Ref Rng & Units 01/03/2018  WBC 3.8 - 10.6 K/uL 9.9  Hemoglobin 13.0 - 18.0 g/dL 12.5(L)  Hematocrit 40.0 - 52.0 % 37.4(L)  Platelets 150 - 440 K/uL 156      US Renal  Result Date: 01/03/2018 CLINICAL DATA:  Acute renal failure, history hypertension EXAM: RENAL / URINARY TRACT ULTRASOUND COMPLETE COMPARISON:  PET-CT 12/13/2017 FINDINGS: Right Kidney: Length: 13.7 cm. Cystic lesion at upper pole 4.1 x 3.7 x 4.0 cm. Normal cortical thickness and echogenicity. Fluid collections centrally within the RIGHT kidney likely represent  peripelvic renal cysts when compared to the prior CT exam rather than hydronephrosis. No additional mass, definite hydronephrosis, or shadowing calculi. Left Kidney: Length: 12.8 cm. Normal cortical thickness and echogenicity. Fluid collections at the central LEFT kidney likely represent peripelvic renal cysts when correlated with the previous PET-CT rather than hydronephrosis. No definite solid mass, hydronephrosis or shadowing calcification. Bladder: Prostatic enlargement, gland measuring 3.8 x 4.1 x 4.1 cm. Questionable bladder base mass mass versus indentation of the bladder base by prostatic enlargement. IMPRESSION: Fluid collections are seen at the renal sinuses and renal pelvis bilaterally which likely represent peripelvic renal cysts rather than hydronephrosis when correlated with the prior PET-CT exam. Additional cyst at upper pole RIGHT kidney 4.1 cm greatest size. Prostatic enlargement with question extension  of prostate gland into bladder base versus small bladder base mass; recommend cystoscopy to exclude bladder base tumor. Electronically Signed   By: Lavonia Dana M.D.   On: 01/03/2018 10:38   Nm Pet Image Initial (pi) Skull Base To Thigh  Result Date: 12/13/2017 CLINICAL DATA:  Initial treatment strategy for oropharyngeal neoplasm. EXAM: NUCLEAR MEDICINE PET SKULL BASE TO THIGH TECHNIQUE: 11.5 mCi F-18 FDG was injected intravenously. Full-ring PET imaging was performed from the skull base to thigh after the radiotracer. CT data was obtained and used for attenuation correction and anatomic localization. FASTING BLOOD GLUCOSE:  Value: 89 mg/dl COMPARISON:  Neck CT 11/15/2017 FINDINGS: NECK: Marked hypermetabolism noted in the region of the oropharynx/tounge base on the left side correlating with asymmetric soft tissue density with mass effect. The SUV max is 14.15. Associated right level 2 lymph node measuring 13.5 mm with SUV max of 5.4 Large necrotic nodal mass spanning level 3, 4 and 5 on the  right side is markedly hypermetabolic with SUV max of 96.2. No left-sided hypermetabolic lymphadenopathy. CHEST: No hypermetabolic mediastinal or hilar nodes. No suspicious pulmonary nodules on the CT scan. ABDOMEN/PELVIS: No abnormal hypermetabolic activity within the liver, pancreas, adrenal glands, or spleen. No hypermetabolic lymph nodes in the abdomen or pelvis. SKELETON: No focal hypermetabolic activity to suggest skeletal metastasis. IMPRESSION: 1. Primary hypermetabolic mucosal lesion in the left oropharynx/tongue base with right-sided bulky multilevel lymphadenopathy. 2. No findings for metastatic disease involving the chest, abdomen, pelvis or bony structures. Electronically Signed   By: Marijo Sanes M.D.   On: 12/13/2017 12:24    Assessment and plan-  Patient is a 62 y.o. male with history of squamous carcinoma of oropharynx, currently on concurrent chemo and radiation, status post cycle 1 cisplatin, presented with acute kidney failure.  #Acute kidney failure likely secondary to nephrotoxicity of cis-platinum. Continue IV fluids. Kidney function is improving.  Ultrasound renal results reviewed.  Questionable prostate enlargement extending to the base of bladder versus small bladder mass. Appreciate urology input. Less likely bladder cancer, follow up outpatient cystoscopy.  #Squamous carcinoma of oropharynx status post 1 dose of cisplatin.  No plan for inpatient chemotherapy.  Patient may continue his daily radiation for treatment of head and neck cancer.  #Mild anemia likely due to volume dilution.  Continue monitor  Thank you for involving me in patient's care. Dr.Finnegan covers weekend.   Earlie Server, MD, PhD Hematology Oncology [facility] Pager- [phone] 01/04/2018

## 2018-01-04 NOTE — Progress Notes (Signed)
[facility]  ROUNDING NOTE   Subjective:   Patient doing well. Has no complaints.   Creatinine 5.18 (7.03)  Objective:  Vital signs in last 24 hours:  Temp:  [97.9 F (36.6 C)-98.6 F (37 C)] 98.1 F (36.7 C) (02/01 0535) Pulse Rate:  [69-78] 69 (02/01 0535) Resp:  [18] 18 (02/01 0535) BP: (130-149)/(83-88) 139/88 (02/01 0535) SpO2:  [99 %-100 %] 100 % (02/01 0535)  Weight change:  Filed Weights   01/02/18 1700  Weight: 99.3 kg (219 lb)    Intake/Output: I/O last 3 completed shifts: In: 5315.8 [P.O.:720; I.V.:4595.8] Out: -    Intake/Output this shift:  Total I/O In: 240 [P.O.:240] Out: -   Physical Exam: General: NAD,   Head: Normocephalic, atraumatic. Moist oral mucosal membranes  Eyes: Anicteric, PERRL  Neck:  left neck mass and parotid mass  Lungs:  Clear to auscultation  Heart: Regular rate and rhythm  Abdomen:  Soft, nontender,   Extremities: no peripheral edema.  Neurologic: Nonfocal, moving all four extremities  Skin: No lesions        Basic Metabolic Panel: Recent Labs  Lab 01/02/18 0945 01/03/18 0428 01/04/18 0847  NA 133* 138 139  K 4.7 5.2* 4.2  CL 100* 108 108  CO2 19* 20* 20*  GLUCOSE 143* 97 151*  BUN 98* 95* 77*  CREATININE 7.44* 7.03* 5.18*  CALCIUM 8.5* 8.2* 8.4*    Liver Function Tests: Recent Labs  Lab 01/02/18 0945  AST 22  ALT 30  ALKPHOS 72  BILITOT 0.7  PROT 6.7  ALBUMIN 3.4*   No results for input(s): LIPASE, AMYLASE in the last 168 hours. No results for input(s): AMMONIA in the last 168 hours.  CBC: Recent Labs  Lab 01/02/18 0945 01/03/18 0428  WBC 10.5 9.9  NEUTROABS 8.9*  --   HGB 13.7 12.5*  HCT 41.2 37.4*  MCV 89.1 88.7  PLT 185 156    Cardiac Enzymes: No results for input(s): CKTOTAL, CKMB, CKMBINDEX, TROPONINI in the last 168 hours.  BNP: Invalid input(s): POCBNP  CBG: No results for input(s): GLUCAP in the last 168 hours.  Microbiology: No results found for this or any  previous visit.  Coagulation Studies: No results for input(s): LABPROT, INR in the last 72 hours.  Urinalysis: No results for input(s): COLORURINE, LABSPEC, PHURINE, GLUCOSEU, HGBUR, BILIRUBINUR, KETONESUR, PROTEINUR, UROBILINOGEN, NITRITE, LEUKOCYTESUR in the last 72 hours.  Invalid input(s): APPERANCEUR    Imaging: US Renal  Result Date: 01/03/2018 CLINICAL DATA:  Acute renal failure, history hypertension EXAM: RENAL / URINARY TRACT ULTRASOUND COMPLETE COMPARISON:  PET-CT 12/13/2017 FINDINGS: Right Kidney: Length: 13.7 cm. Cystic lesion at upper pole 4.1 x 3.7 x 4.0 cm. Normal cortical thickness and echogenicity. Fluid collections centrally within the RIGHT kidney likely represent peripelvic renal cysts when compared to the prior CT exam rather than hydronephrosis. No additional mass, definite hydronephrosis, or shadowing calculi. Left Kidney: Length: 12.8 cm. Normal cortical thickness and echogenicity. Fluid collections at the central LEFT kidney likely represent peripelvic renal cysts when correlated with the previous PET-CT rather than hydronephrosis. No definite solid mass, hydronephrosis or shadowing calcification. Bladder: Prostatic enlargement, gland measuring 3.8 x 4.1 x 4.1 cm. Questionable bladder base mass mass versus indentation of the bladder base by prostatic enlargement. IMPRESSION: Fluid collections are seen at the renal sinuses and renal pelvis bilaterally which likely represent peripelvic renal cysts rather than hydronephrosis when correlated with the prior PET-CT exam. Additional cyst at upper pole RIGHT kidney 4.1 cm  greatest size. Prostatic enlargement with question extension of prostate gland into bladder base versus small bladder base mass; recommend cystoscopy to exclude bladder base tumor. Electronically Signed   By: Lavonia Dana M.D.   On: 01/03/2018 10:38     Medications:   . sodium chloride 125 mL/hr at 01/04/18 1304   . heparin  5,000 Units Subcutaneous Q8H  .  pantoprazole  40 mg Oral Daily   acetaminophen **OR** acetaminophen, albuterol, bisacodyl, HYDROcodone-acetaminophen, ondansetron **OR** ondansetron (ZOFRAN) IV, senna-docusate  Assessment/ Plan:  Mr. Ivan Garcia. is a 62 y.o.  male Mr. Ivan Garcia. is a 62 y.o. white male with head and neck cancer, hypertension, hyperlipidemia, who was admitted to [facility] on 01/02/2018 for Acute kidney injury    1. Acute renal failure: with baseline creatinine of 1.15, GFR of >60 on 12/19/17. Acute renal failure secondary to IV contrast and cisplatin exposure.  - Continue NS - nonoliguric urine output.  - no uremic symptoms.  - No acute indication for dialysis      LOS: 2 Bellina Tokarczyk 2/1/20192:41 PM

## 2018-01-04 NOTE — Progress Notes (Signed)
[place] [facility] [place] NAME: Ivan Garcia    MR#:  [id number]  DATE OF BIRTH:  [birth year]  SUBJECTIVE:  CHIEF COMPLAINT: Patient is resting comfortably.  Asymptomatic.  REVIEW OF SYSTEMS:  CONSTITUTIONAL: No fever, fatigue or weakness.  EYES: No blurred or double vision.  EARS, NOSE, AND THROAT: No tinnitus or ear pain.  RESPIRATORY: No cough, shortness of breath, wheezing or hemoptysis.  CARDIOVASCULAR: No chest pain, orthopnea, edema.  GASTROINTESTINAL: No nausea, vomiting, diarrhea or abdominal pain.  GENITOURINARY: No dysuria, hematuria.  ENDOCRINE: No polyuria, nocturia,  HEMATOLOGY: No anemia, easy bruising or bleeding SKIN: No rash or lesion. MUSCULOSKELETAL: No joint pain or arthritis.   NEUROLOGIC: No tingling, numbness, weakness.  PSYCHIATRY: No anxiety or depression.   DRUG ALLERGIES:  No Known Allergies  VITALS:  Blood pressure 134/76, pulse 78, temperature 97.7 F (36.5 C), temperature source Oral, resp. rate 18, height 5\' 9"  (1.753 m), weight 99.3 kg (219 lb), SpO2 100 %.  PHYSICAL EXAMINATION:  GENERAL:  62 y.o.-year-old patient lying in the bed with no acute distress.  EYES: Pupils equal, round, reactive to light and accommodation. No scleral icterus. Extraocular muscles intact.  HEENT: Head atraumatic, normocephalic. Oropharynx and nasopharynx clear.  Right lateral aspect of the neck with mass NECK:  Supple, no jugular venous distention. No thyroid enlargement, no tenderness.  LUNGS: Normal breath sounds bilaterally, no wheezing, rales,rhonchi or crepitation. No use of accessory muscles of respiration.  CARDIOVASCULAR: S1, S2 normal. No murmurs, rubs, or gallops.  ABDOMEN: Soft, nontender, nondistended. Bowel sounds present. No organomegaly or mass.  EXTREMITIES: No pedal edema, cyanosis, or clubbing.  NEUROLOGIC: Cranial nerves II through XII are intact. Muscle strength 5/5 in all extremities. Sensation  intact. Gait not checked.  PSYCHIATRIC: The patient is alert and oriented x 3.  SKIN: No obvious rash, lesion, or ulcer.    LABORATORY PANEL:   CBC Recent Labs  Lab 01/03/18 0428  WBC 9.9  HGB 12.5*  HCT 37.4*  PLT 156   ------------------------------------------------------------------------------------------------------------------  Chemistries  Recent Labs  Lab 01/02/18 0945  01/04/18 0847  NA 133*   < > 139  K 4.7   < > 4.2  CL 100*   < > 108  CO2 19*   < > 20*  GLUCOSE 143*   < > 151*  BUN 98*   < > 77*  CREATININE 7.44*   < > 5.18*  CALCIUM 8.5*   < > 8.4*  AST 22  --   --   ALT 30  --   --   ALKPHOS 72  --   --   BILITOT 0.7  --   --    < > = values in this interval not displayed.   ------------------------------------------------------------------------------------------------------------------  Cardiac Enzymes No results for input(s): TROPONINI in the last 168 hours. ------------------------------------------------------------------------------------------------------------------  RADIOLOGY:  US Renal  Result Date: 01/03/2018 CLINICAL DATA:  Acute renal failure, history hypertension EXAM: RENAL / URINARY TRACT ULTRASOUND COMPLETE COMPARISON:  PET-CT 12/13/2017 FINDINGS: Right Kidney: Length: 13.7 cm. Cystic lesion at upper pole 4.1 x 3.7 x 4.0 cm. Normal cortical thickness and echogenicity. Fluid collections centrally within the RIGHT kidney likely represent peripelvic renal cysts when compared to the prior CT exam rather than hydronephrosis. No additional mass, definite hydronephrosis, or shadowing calculi. Left Kidney: Length: 12.8 cm. Normal cortical thickness and echogenicity. Fluid collections at the central LEFT kidney likely represent peripelvic renal cysts when correlated with the previous  PET-CT rather than hydronephrosis. No definite solid mass, hydronephrosis or shadowing calcification. Bladder: Prostatic enlargement, gland measuring 3.8 x 4.1 x 4.1  cm. Questionable bladder base mass mass versus indentation of the bladder base by prostatic enlargement. IMPRESSION: Fluid collections are seen at the renal sinuses and renal pelvis bilaterally which likely represent peripelvic renal cysts rather than hydronephrosis when correlated with the prior PET-CT exam. Additional cyst at upper pole RIGHT kidney 4.1 cm greatest size. Prostatic enlargement with question extension of prostate gland into bladder base versus small bladder base mass; recommend cystoscopy to exclude bladder base tumor. Electronically Signed   By: Lavonia Dana M.D.   On: 01/03/2018 10:38    EKG:  No orders found for this or any previous visit.  ASSESSMENT AND PLAN:    #Acute renal failure, possible due to chemotherapy from cisplatin Hydrate with IV fluids  follow-up BMP.    Creatinine was at 1.15 on January 23rd 2019.  Following chemotherapy it went up to 7.44-7.03-5.18 Renal ultrasound-prostatic enlargement with question extension of prostate gland into bladder base versus small bladder base mass recommending cystoscopy to exclude tumor nephrology and urology are consulted  Nephrology is recommending to continue IV fluids and not considering dialysis at this time no uremic symptoms  Hold ACE inhibitor. Avoid nephrotoxins and renal dose medications  Possible bladder mass per renal ultrasound Patient is seen by urology pathologic bladder mass is unlikely a per urology and recommending outpatient cystoscopy  Hyponatremia.   improved with normal saline.  Sodium at 139.    Hyperkalemia :potassium at 5.2 Kayexalate given repeat BMP k  4.2 today   Hypertension, hold irbesartan, start Norvasc.  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 Tasia Catchings has seen the patient Status post 1 cycle of cisplatin      All the records are reviewed and case discussed with Care Management/Social Workerr. Management plans discussed with the patient, family and they are in  agreement.  CODE STATUS: Full code  TOTAL TIME TAKING CARE OF THIS PATIENT: 24minutes.   POSSIBLE D/C IN 2-3 DAYS, DEPENDING ON CLINICAL CONDITION.  Note: This dictation was prepared with Dragon dictation along with smaller phrase technology. Any transcriptional errors that result from this process are unintentional.   Nicholes Mango M.D on 01/04/2018 at 3:28 PM  Between 7am to 6pm - Pager - [phone] After 6pm go to [email or web address] - password [email or web address] [facility]  [facility]  Office  [phone]  CC: Primary care physician; Albina Billet, MD

## 2018-01-05 LAB — BASIC METABOLIC PANEL
Anion gap: 9 (ref 5–15)
BUN: 59 mg/dL — AB (ref 6–20)
CALCIUM: 8.1 mg/dL — AB (ref 8.9–10.3)
CO2: 20 mmol/L — ABNORMAL LOW (ref 22–32)
CREATININE: 3.53 mg/dL — AB (ref 0.61–1.24)
Chloride: 111 mmol/L (ref 101–111)
GFR calc Af Amer: 20 mL/min — ABNORMAL LOW (ref >60)
GFR, EST NON AFRICAN AMERICAN: 17 mL/min — AB (ref >60)
GLUCOSE: 92 mg/dL (ref 65–99)
Potassium: 4.1 mmol/L (ref 3.5–5.1)
Sodium: 140 mmol/L (ref 135–145)

## 2018-01-05 MED ORDER — AMLODIPINE BESYLATE 2.5 MG PO TABS: 2.5000 mg | ORAL_TABLET | Freq: Every day | ORAL | 0 refills | Status: DC

## 2018-01-05 NOTE — Discharge Summary (Signed)
[facility] NAME: Ivan Garcia    MR#:  [id number]  DATE OF BIRTH:  Feb 05, 1956  DATE OF ADMISSION:  01/02/2018 ADMITTING PHYSICIAN: Demetrios Loll, MD  DATE OF DISCHARGE: 01/05/2018  2:00 PM  PRIMARY CARE PHYSICIAN: Albina Billet, MD    ADMISSION DIAGNOSIS:  Acute kidney injury   DISCHARGE DIAGNOSIS:  Active Problems:   ARF (acute renal failure) ([place])   SECONDARY DIAGNOSIS:   Past Medical History:  Diagnosis Date  . Arthritis   . Cancer ([place])    Head and neck cancer  . Hemorrhoids   . Hyperlipidemia   . Hypertension     HOSPITAL COURSE:   1.  Acute kidney injury.  Patient was admitted with a creatinine of 7.44.  Baseline creatinine around 1.15.  Patient was given IV fluid hydration during the hospital course.  Creatinine came down to 3.53.  Case discussed with nephrology and they think it will continue to get better.  Patient is eating and drinking well.  Recommend checking a BMP with oncology as outpatient.  The oncology team thinks it may be secondary to the chemotherapy the patient receiving.  Hold her irbesartan. 2.  Possible bladder mass on renal ultrasound.  Patient had a PET scan as outpatient in the midst did not show this.  Can follow-up with Dr. Bernardo Heater as outpatient. 3.  Squamous carcinoma of the oropharynx.  Follow-up as outpatient for chemotherapy and radiation 4.  Essential hypertension start low-dose Norvasc 5.  Hyperkalemia secondary to acute kidney injury this has improved with Kayexalate and kidney function improving 6.  Hyperlipidemia unspecified on Crestor  DISCHARGE CONDITIONS:   Satisfactory  CONSULTS OBTAINED:  Treatment Team:  Earlie Server, MD Lavonia Dana, MD Abbie Sons, MD  DRUG ALLERGIES:  No Known Allergies  DISCHARGE MEDICATIONS:   Allergies as of 01/05/2018   No Known Allergies     Medication List    STOP taking these medications   irbesartan 150 MG tablet Commonly known as:   AVAPRO     TAKE these medications   amLODipine 2.5 MG tablet Commonly known as:  NORVASC Take 1 tablet (2.5 mg total) by mouth daily.   lansoprazole 30 MG capsule Commonly known as:  PREVACID Take 1 capsule (30 mg total) by mouth daily at 12 noon.   lidocaine-prilocaine cream Commonly known as:  EMLA Apply to affected area once   multivitamin with minerals tablet Take 1 tablet by mouth daily.   ondansetron 8 MG tablet Commonly known as:  ZOFRAN Take 1 tablet (8 mg total) by mouth 2 (two) times daily as needed.   oxyCODONE-acetaminophen 5-325 MG tablet Commonly known as:  PERCOCET/ROXICET Take 1 tablet by mouth every 6 (six) hours as needed for severe pain.   prochlorperazine 10 MG tablet Commonly known as:  COMPAZINE Take 1 tablet (10 mg total) by mouth every 6 (six) hours as needed (Nausea or vomiting).   rosuvastatin 10 MG tablet Commonly known as:  CRESTOR Take 10 mg by mouth every evening.        DISCHARGE INSTRUCTIONS:    Follow-up with radiation oncology  follow-up Dr. Tasia Catchings oncology as outpatient  If you experience worsening of your admission symptoms, develop shortness of breath, life threatening emergency, suicidal or homicidal thoughts you must seek medical attention immediately by calling 911 or calling your MD immediately  if symptoms less severe.  You Must read complete instructions/literature along with all the possible adverse reactions/side effects  for all the Medicines you take and that have been prescribed to you. Take any new Medicines after you have completely understood and accept all the possible adverse reactions/side effects.   Please note  You were cared for by a hospitalist during your hospital stay. If you have any questions about your discharge medications or the care you received while you were in the hospital after you are discharged, you can call the unit and asked to speak with the hospitalist on call if the hospitalist that took care of  you is not available. Once you are discharged, your primary care physician will handle any further medical issues. Please note that NO REFILLS for any discharge medications will be authorized once you are discharged, as it is imperative that you return to your primary care physician (or establish a relationship with a primary care physician if you do not have one) for your aftercare needs so that they can reassess your need for medications and monitor your lab values.    Today   CHIEF COMPLAINT:  No chief complaint on file.   HISTORY OF PRESENT ILLNESS:  Ivan Garcia  is a 62 y.o. male sent in with acute kidney injury   VITAL SIGNS:  Blood pressure (!) 147/88, pulse (!) 59, temperature 98.8 F (37.1 C), temperature source Oral, resp. rate 16, height 5\' 9"  (1.753 m), weight 99.3 kg (219 lb), SpO2 98 %.    PHYSICAL EXAMINATION:  GENERAL:  62 y.o.-year-old patient lying in the bed with no acute distress.  EYES: Pupils equal, round, reactive to light and accommodation. No scleral icterus. Extraocular muscles intact.  HEENT: Head atraumatic, normocephalic. Oropharynx and nasopharynx clear.  Right neck mass. NECK:  Supple, no jugular venous distention. No thyroid enlargement, no tenderness.  LUNGS: Normal breath sounds bilaterally, no wheezing, rales,rhonchi or crepitation. No use of accessory muscles of respiration.  CARDIOVASCULAR: S1, S2 normal. No murmurs, rubs, or gallops.  ABDOMEN: Soft, non-tender, non-distended. Bowel sounds present. No organomegaly or mass.  EXTREMITIES: No pedal edema, cyanosis, or clubbing.  NEUROLOGIC: Cranial nerves II through XII are intact. Muscle strength 5/5 in all extremities. Sensation intact. Gait not checked.  PSYCHIATRIC: The patient is alert and oriented x 3.  SKIN: No obvious rash, lesion, or ulcer.   DATA REVIEW:   CBC Recent Labs  Lab 01/03/18 0428  WBC 9.9  HGB 12.5*  HCT 37.4*  PLT 156    Chemistries  Recent Labs  Lab  01/02/18 0945  01/05/18 0506  NA 133*   < > 140  K 4.7   < > 4.1  CL 100*   < > 111  CO2 19*   < > 20*  GLUCOSE 143*   < > 92  BUN 98*   < > 59*  CREATININE 7.44*   < > 3.53*  CALCIUM 8.5*   < > 8.1*  AST 22  --   --   ALT 30  --   --   ALKPHOS 72  --   --   BILITOT 0.7  --   --    < > = values in this interval not displayed.      Management plans discussed with the patient, and he is in agreement.  CODE STATUS:     Code Status Orders  (From admission, onward)        Start     Ordered   01/02/18 1522  Full code  Continuous     01/02/18 1521  Code Status History    Date Active Date Inactive Code Status Order ID Comments User Context   This patient has a current code status but no historical code status.    Advance Directive Documentation     Most Recent Value  Type of Advance Directive  Living will  Pre-existing out of facility DNR order (yellow form or pink MOST form)  No data  "MOST" Form in Place?  No data      TOTAL TIME TAKING CARE OF THIS PATIENT: 32 minutes.    Loletha Grayer M.D on 01/05/2018 at 3:37 PM  Between 7am to 6pm - Pager - [phone]  After 6pm go to [email or web address] - password [email or web address] [facility]  [phone]  CC: Primary care physician; Albina Billet, MD

## 2018-01-05 NOTE — Progress Notes (Signed)
  Patient discharged with friend. Port de accessed, verbalized understanding of discharge instructions given. Patient with no complaints.

## 2018-01-05 NOTE — Discharge Instructions (Signed)
Recommend checking BMP this week  Acute Kidney Injury, Adult Acute kidney injury is a sudden worsening of kidney function. The kidneys are organs that have several jobs. They filter the blood to remove waste products and extra fluid. They also maintain a healthy balance of minerals and hormones in the body, which helps control blood pressure and keep bones strong. With this condition, your kidneys do not do their jobs as well as they should. This condition ranges from mild to severe. Over time it may develop into long-lasting (chronic) kidney disease. Early detection and treatment may prevent acute kidney injury from developing into a chronic condition. What are the causes? Common causes of this condition include:  A problem with blood flow to the kidneys. This may be caused by: ? Low blood pressure (hypotension) or shock. ? Blood loss. ? Heart and blood vessel (cardiovascular) disease. ? Severe burns. ? Liver disease.  Direct damage to the kidneys. This may be caused by: ? Certain medicines. ? A kidney infection. ? Poisoning. ? Being around or in contact with toxic substances. ? A surgical wound. ? A hard, direct hit to the kidney area.  A sudden blockage of urine flow. This may be caused by: ? Cancer. ? Kidney stones. ? An enlarged prostate in males.  What are the signs or symptoms? Symptoms of this condition may not be obvious until the condition becomes severe. Symptoms of this condition can include:  Tiredness (lethargy), or difficulty staying awake.  Nausea or vomiting.  Swelling (edema) of the face, legs, ankles, or feet.  Problems with urination, such as: ? Abdominal pain, or pain along the side of your stomach (flank). ? Decreased urine production. ? Decrease in the force of urine flow.  Muscle twitches and cramps, especially in the legs.  Confusion or trouble concentrating.  Loss of appetite.  Fever.  How is this diagnosed? This condition may be diagnosed  with tests, including:  Blood tests.  Urine tests.  Imaging tests.  A test in which a sample of tissue is removed from the kidneys to be examined under a microscope (kidney biopsy).  How is this treated? Treatment for this condition depends on the cause and how severe the condition is. In mild cases, treatment may not be needed. The kidneys may heal on their own. In more severe cases, treatment will involve:  Treating the cause of the kidney injury. This may involve changing any medicines you are taking or adjusting your dosage.  Fluids. You may need specialized IV fluids to balance your body's needs.  Having a catheter placed to drain urine and prevent blockages.  Preventing problems from occurring. This may mean avoiding certain medicines or procedures that can cause further injury to the kidneys.  In some cases treatment may also require:  A procedure to remove toxic wastes from the body (dialysis or continuous renal replacement therapy - CRRT).  Surgery. This may be done to repair a torn kidney, or to remove the blockage from the urinary system.  Follow these instructions at home: Medicines  Take over-the-counter and prescription medicines only as told by your health care provider.  Do not take any new medicines without your health care provider's approval. Many medicines can worsen your kidney damage.  Do not take any vitamin and mineral supplements without your health care provider's approval. Many nutritional supplements can worsen your kidney damage. Lifestyle  If your health care provider prescribed changes to your diet, follow them. You may need to decrease the amount  of protein you eat.  Achieve and maintain a healthy weight. If you need help with this, ask your health care provider.  Start or continue an exercise plan. Try to exercise at least 30 minutes a day, 5 days a week.  Do not use any tobacco products, such as cigarettes, chewing tobacco, and  e-cigarettes. If you need help quitting, ask your health care provider. General instructions  Keep track of your blood pressure. Report changes in your blood pressure as told by your health care provider.  Stay up to date with immunizations. Ask your health care provider which immunizations you need.  Keep all follow-up visits as told by your health care provider. This is important. Where to find more information:  American Association of Kidney Patients: BombTimer.gl  National Kidney Foundation: www.kidney.[place]: https://mathis.com/  Life Options Rehabilitation Program: ? www.lifeoptions.org ? www.kidneyschool.org Contact a health care provider if:  Your symptoms get worse.  You develop new symptoms. Get help right away if:  You develop symptoms of worsening kidney disease, which include: ? Headaches. ? Abnormally dark or light skin. ? Easy bruising. ? Frequent hiccups. ? Chest pain. ? Shortness of breath. ? End of menstruation in women. ? Seizures. ? Confusion or altered mental status. ? Abdominal or back pain. ? Itchiness.  You have a fever.  Your body is producing less urine.  You have pain or bleeding when you urinate. Summary  Acute kidney injury is a sudden worsening of kidney function.  Acute kidney injury can be caused by problems with blood flow to the kidneys, direct damage to the kidneys, and sudden blockage of urine flow.  Symptoms of this condition may not be obvious until it becomes severe. Symptoms may include edema, lethargy, confusion, nausea or vomiting, and problems passing urine.  This condition can usually be diagnosed with blood tests, urine tests, and imaging tests. Sometimes a kidney biopsy is done to diagnose this condition.  Treatment for this condition often involves treating the underlying cause. It is treated with fluids, medicines, dialysis, diet changes, or surgery. This information is not intended to replace advice  given to you by your health care provider. Make sure you discuss any questions you have with your health care provider. Document Released: 06/05/2011 Document Revised: 03/22/2017 Document Reviewed: 11/10/2016 Elsevier Interactive Patient Education  [employer].

## 2018-01-05 NOTE — Progress Notes (Signed)
Port deaccessed per Dr Marshia Ly order, Dr Grayland Ormond made aware that port deaccessed and no heparin flush ordered, Dr Grayland Ormond states that Dr Collie Siad nurse with flush the port in 3 days

## 2018-01-05 NOTE — Progress Notes (Signed)
  [facility]  ROUNDING NOTE   Subjective:   Patient doing well. Has no complaints.   Creatinine 3.53 (5.18) (7.03)  Objective:  Vital signs in last 24 hours:  Temp:  [97.6 F (36.4 C)-98.8 F (37.1 C)] 98.8 F (37.1 C) (02/02 0745) Pulse Rate:  [59-78] 59 (02/02 0745) Resp:  [16-18] 16 (02/02 0745) BP: (134-154)/(76-88) 147/88 (02/02 0745) SpO2:  [98 %-100 %] 98 % (02/02 0745)  Weight change:  Filed Weights   01/02/18 1700  Weight: 99.3 kg (219 lb)    Intake/Output: I/O last 3 completed shifts: In: 7072.5 [P.O.:960; I.V.:6112.5] Out: -    Intake/Output this shift:  No intake/output data recorded.  Physical Exam: General: NAD,   Head: Normocephalic, atraumatic. Moist oral mucosal membranes  Eyes: Anicteric, PERRL  Neck:  left neck mass and parotid mass  Lungs:  Clear to auscultation  Heart: Regular rate and rhythm  Abdomen:  Soft, nontender,   Extremities: no peripheral edema.  Neurologic: Nonfocal, moving all four extremities  Skin: No lesions        Basic Metabolic Panel: Recent Labs  Lab 01/02/18 0945 01/03/18 0428 01/04/18 0847 01/05/18 0506  NA 133* 138 139 140  K 4.7 5.2* 4.2 4.1  CL 100* 108 108 111  CO2 19* 20* 20* 20*  GLUCOSE 143* 97 151* 92  BUN 98* 95* 77* 59*  CREATININE 7.44* 7.03* 5.18* 3.53*  CALCIUM 8.5* 8.2* 8.4* 8.1*    Liver Function Tests: Recent Labs  Lab 01/02/18 0945  AST 22  ALT 30  ALKPHOS 72  BILITOT 0.7  PROT 6.7  ALBUMIN 3.4*   No results for input(s): LIPASE, AMYLASE in the last 168 hours. No results for input(s): AMMONIA in the last 168 hours.  CBC: Recent Labs  Lab 01/02/18 0945 01/03/18 0428  WBC 10.5 9.9  NEUTROABS 8.9*  --   HGB 13.7 12.5*  HCT 41.2 37.4*  MCV 89.1 88.7  PLT 185 156    Cardiac Enzymes: No results for input(s): CKTOTAL, CKMB, CKMBINDEX, TROPONINI in the last 168 hours.  BNP: Invalid input(s): POCBNP  CBG: No results for input(s): GLUCAP in the last 168  hours.  Microbiology: No results found for this or any previous visit.  Coagulation Studies: No results for input(s): LABPROT, INR in the last 72 hours.  Urinalysis: No results for input(s): COLORURINE, LABSPEC, PHURINE, GLUCOSEU, HGBUR, BILIRUBINUR, KETONESUR, PROTEINUR, UROBILINOGEN, NITRITE, LEUKOCYTESUR in the last 72 hours.  Invalid input(s): APPERANCEUR    Imaging: No results found.   Medications:    . heparin  5,000 Units Subcutaneous Q8H  . pantoprazole  40 mg Oral Daily   acetaminophen **OR** acetaminophen, albuterol, bisacodyl, HYDROcodone-acetaminophen, ondansetron **OR** ondansetron (ZOFRAN) IV, senna-docusate  Assessment/ Plan:  Ivan Garcia. is a 62 y.o.  male Ivan Garcia. is a 62 y.o. white male with head and neck cancer, hypertension, hyperlipidemia, who was admitted to [facility] on 01/02/2018 for Acute kidney injury    1. Acute renal failure: with baseline creatinine of 1.15, GFR of >60 on 12/19/17. Acute renal failure secondary to IV contrast and cisplatin exposure. Creatinine improving.  - Continue NS. Encourage PO intake.  - nonoliguric urine output.  - no uremic symptoms.  - No acute indication for dialysis  Will need outpatient follow up.       LOS: 3 Rishard Delange 2/2/201911:57 AM

## 2018-01-07 ENCOUNTER — Ambulatory Visit
Admission: RE | Admit: 2018-01-07 | Discharge: 2018-01-07 | Disposition: A | Discharge status: Home / Self Care | Payer: BLUE CROSS/BLUE SHIELD | Source: Ambulatory Visit | Attending: Radiation Oncology | Admitting: Radiation Oncology

## 2018-01-07 ENCOUNTER — Other Ambulatory Visit: Payer: Self-pay | Admitting: *Deleted

## 2018-01-07 ENCOUNTER — Telehealth: Payer: Self-pay

## 2018-01-07 ENCOUNTER — Ambulatory Visit: Payer: BLUE CROSS/BLUE SHIELD

## 2018-01-07 DIAGNOSIS — E785 Hyperlipidemia, unspecified: Secondary | ICD-10-CM | POA: Diagnosis not present

## 2018-01-07 DIAGNOSIS — Z79899 Other long term (current) drug therapy: Secondary | ICD-10-CM | POA: Diagnosis not present

## 2018-01-07 DIAGNOSIS — K649 Unspecified hemorrhoids: Secondary | ICD-10-CM | POA: Diagnosis not present

## 2018-01-07 DIAGNOSIS — R221 Localized swelling, mass and lump, neck: Secondary | ICD-10-CM | POA: Diagnosis present

## 2018-01-07 DIAGNOSIS — Z510 Encounter for antineoplastic radiation therapy: Secondary | ICD-10-CM | POA: Diagnosis not present

## 2018-01-07 DIAGNOSIS — I10 Essential (primary) hypertension: Secondary | ICD-10-CM | POA: Diagnosis not present

## 2018-01-07 DIAGNOSIS — C109 Malignant neoplasm of oropharynx, unspecified: Secondary | ICD-10-CM

## 2018-01-07 DIAGNOSIS — M129 Arthropathy, unspecified: Secondary | ICD-10-CM | POA: Diagnosis not present

## 2018-01-07 DIAGNOSIS — M542 Cervicalgia: Secondary | ICD-10-CM | POA: Diagnosis not present

## 2018-01-07 NOTE — Telephone Encounter (Signed)
Nutrition Follow-up:  Patient with oropharynx cancer, HPV positive.  Patient followed by Dr. Tasia Catchings.  Noted patient received cisplatin with resulting acute kidney injury with hospital admission on 1/30-2/2.  Patient continues with radiation therapy.    Called patient for nutrition follow-up as RD will not be able to see patient on 2/7 as previously planned.  Patient reports that appetite is still good following 5 radiation treatments.  Reports that he is drinking about 1 ensure plus shake per day.  Has tried recipes with fruit, peanut butter, soy milk and whey protein powder does not drink shake every day but sometimes will drink for breakfast.  Reports has started trying to add egg for breakfast on some days.  Reports has been eating steamed vegetables and lean meats.  Family and friends have been bringing him food to eat as well.  Reports ate well during hospital admission.  No nutrition impact symptoms reported at this time per patient.  Patient reports that he feels better after eating more nutritious foods.    Patient wanting to get back to exercise class.   Medications: reviewed  Labs: 2/2 labs BUN 59, creatinine 3.53, K WNL  Anthropometrics:   Weight increased to 219 lb from 217 lb on 1/23.  UBW 220s.   NUTRITION DIAGNOSIS: Predicted suboptimal intake continues with continuing treatment   MALNUTRITION DIAGNOSIS: none at this time   INTERVENTION:   Encouraged continuing good nutrition during treatment.   Patient will continue supplement shakes as needed for additional calories and protein Encouraged patient to discuss resuming exercise with Dr. Tasia Catchings and patient verbalized understanding.      MONITORING, EVALUATION, GOAL: Patient will consume adequate calories and protein to meet nutritional needs and maintain weight during treatment.     NEXT VISIT: Feb 14 after radiation treatment  Monti Jilek [name], [place], [place] Registered Dietitian [phone] (pager)

## 2018-01-07 NOTE — Progress Notes (Addendum)
Hematology/Oncology  Follow Up note [facility] [phone] [phone]   Patient Care Team: Albina Billet, MD as PCP - General (Internal Medicine) Albina Billet, MD (Internal Medicine) Bary Castilla Forest Gleason, MD (General Surgery)  REFERRING PROVIDER: Dr.McQueen CHIEF COMPLAINTS/PURPOSE OF CONSULTATION:  Evaluation of newly diagnosed head and neck cancer.  HISTORY OF PRESENTING ILLNESS:  Ivan Garcia. is a  62 y.o.  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 Tami Ribas. With ENT examination, he was found to have midline tongue base is to come down to 2 Weekly which is 40 mg pedunculated mass, 3cm.  And had CT soft tissue neck which confirmed pronounced lymphadenopathy in the right neck including conglomerate partially necrotic nodal mass spanning level III, 4 and 5, measuring 5.2 x 5.5 x 3.7 cm. Additional pathological nodes and right level 2 about 2.5 cm. There is asymmetry in the right tonsillar region. # Biopsy of the right neck mass and pathology revealed squamous cancer. P16 positive.  Current Cancer Treatment Cisplatin 100 mg/m2 every 3 weeks with concurrent RT which started on 12/31/2017.  S/p one dose of Cisplatin.    INTERVAL HISTORY Ivan Garcia. is a 62 y.o. male who has above history reviewed by me today presents for follow up visit for management of squamous carcinoma of oropharynx. S/p cycle 1 Cisplatin.  He developed acute kidney failure which considered to be secondary to the nephrotoxicity from cisplatin.  He was hospitalized for continuous IV fluid hydration and was seen by nephrologist.  Creatinine gradually improves and he was discharged home.  Today he reports feeling better, denies any sore throat or swallowing difficulty.  He is drinking adequate fluid every day.  No new concerns or symptoms. .  Review of Systems  Constitutional: Negative for  chills, diaphoresis, fever and malaise/fatigue.  HENT: Negative for congestion, ear discharge, ear pain and tinnitus.        Denies any hearing loss  Eyes: Negative for blurred vision, photophobia, discharge and redness.  Respiratory: Negative for cough, sputum production and shortness of breath.   Cardiovascular: Negative for chest pain and orthopnea.  Gastrointestinal: Negative for constipation, diarrhea, heartburn, nausea and vomiting.  Genitourinary: Negative for dysuria, frequency, hematuria and urgency.  Musculoskeletal: Negative for myalgias.  Skin: Negative for rash.  Neurological: Negative for dizziness, tremors and sensory change.  Endo/Heme/Allergies: Negative for polydipsia. Does not bruise/bleed easily.  Psychiatric/Behavioral: Negative for depression, substance abuse and suicidal ideas. The patient is not nervous/anxious.     MEDICAL HISTORY:  Past Medical History:  Diagnosis Date  . Arthritis   . Cancer ([place])    Head and neck cancer  . Hemorrhoids   . Hyperlipidemia   . Hypertension     SURGICAL HISTORY: Past Surgical History:  Procedure Laterality Date  . COLONOSCOPY WITH PROPOFOL N/A 04/04/2017   Procedure: COLONOSCOPY WITH PROPOFOL;  Surgeon: Robert Bellow, MD;  Location: [facility] ENDOSCOPY;  Service: Endoscopy;  Laterality: N/A;  . MANDIBLE SURGERY    . PORTA CATH INSERTION N/A 12/19/2017   Procedure: PORTA CATH INSERTION;  Surgeon: Algernon Huxley, MD;  Location: [facility];  Service: Cardiovascular;  Laterality: N/A;  . TONSILLECTOMY      SOCIAL HISTORY: Social History   Socioeconomic History  . Marital status: Single    Spouse name: Not on file  . Number of children: Not on file  . Years of  education: Not on file  . Highest education level: Not on file  Social Needs  . Financial resource strain: Not on file  . Food insecurity - worry: Not on file  . Food insecurity - inability: Not on file  . Transportation needs - medical: Not on file  .  Transportation needs - non-medical: Not on file  Occupational History  . Not on file  Tobacco Use  . Smoking status: Never Smoker  . Smokeless tobacco: Never Used  Substance and Sexual Activity  . Alcohol use: No  . Drug use: No  . Sexual activity: Not on file  Other Topics Concern  . Not on file  Social History Narrative  . Not on file    FAMILY HISTORY: Family History  Problem Relation Age of Onset  . Breast cancer Mother   . Asthma Mother   . Congestive Heart Failure Mother   . Prostate cancer Father   . Brain cancer Father   . Bladder Cancer Father     ALLERGIES:  has No Known Allergies.  MEDICATIONS:  Current Outpatient Medications  Medication Sig Dispense Refill  . amLODipine (NORVASC) 2.5 MG tablet Take 1 tablet (2.5 mg total) by mouth daily. 30 tablet 0  . lidocaine-prilocaine (EMLA) cream Apply to affected area once 30 g 3  . Multiple Vitamins-Minerals (MULTIVITAMIN WITH MINERALS) tablet Take 1 tablet by mouth daily.    . rosuvastatin (CRESTOR) 10 MG tablet Take 10 mg by mouth every evening.  4  . lansoprazole (PREVACID) 30 MG capsule Take 1 capsule (30 mg total) by mouth daily at 12 noon. (Patient not taking: Reported on 01/08/2018) 30 capsule 4  . ondansetron (ZOFRAN) 8 MG tablet Take 1 tablet (8 mg total) by mouth 2 (two) times daily as needed. (Patient not taking: Reported on 01/08/2018) 30 tablet 1  . oxyCODONE-acetaminophen (PERCOCET/ROXICET) 5-325 MG tablet Take 1 tablet by mouth every 6 (six) hours as needed for severe pain. (Patient not taking: Reported on 01/08/2018) 56 tablet 0  . prochlorperazine (COMPAZINE) 10 MG tablet Take 1 tablet (10 mg total) by mouth every 6 (six) hours as needed (Nausea or vomiting). (Patient not taking: Reported on 01/08/2018) 30 tablet 1   No current facility-administered medications for this visit.      PHYSICAL EXAMINATION: ECOG PERFORMANCE STATUS: 0 - Asymptomatic Vitals:   01/08/18 1009  BP: 117/78  Pulse: 94  Temp: 98.3  F (36.8 C)   Filed Weights   01/08/18 1009  Weight: 212 lb 11.2 oz (96.5 kg)    Physical Exam  Constitutional: He is oriented to person, place, and time and well-developed, well-nourished, and in no distress. No distress.  HENT:  Head: Normocephalic and atraumatic.  Mouth/Throat: Oropharynx is clear and moist. No oropharyngeal exudate.  Eyes: Conjunctivae and EOM are normal. Pupils are equal, round, and reactive to light. Left eye exhibits no discharge. No scleral icterus.  Neck: Normal range of motion. Neck supple. No JVD present.  Right side neck mass. Tongue base mass.  Cardiovascular: Normal rate and regular rhythm. Exam reveals no friction rub.  No murmur heard. Pulmonary/Chest: Breath sounds normal. He has no wheezes. He has no rales.  Abdominal: Soft. Bowel sounds are normal. He exhibits no distension. There is no tenderness. There is no rebound.  Musculoskeletal: Normal range of motion. He exhibits no edema or tenderness.  Lymphadenopathy:    He has cervical adenopathy.  Neurological: He is alert and oriented to person, place, and time. No cranial nerve deficit.  Gait normal.  Skin: Skin is dry. No rash noted. He is not diaphoretic. No erythema.  Psychiatric: Memory and affect normal.     LABORATORY DATA:  I have reviewed the data as listed Lab Results  Component Value Date   WBC 7.7 01/08/2018   HGB 13.3 01/08/2018   HCT 39.4 (L) 01/08/2018   MCV 89.0 01/08/2018   PLT 168 01/08/2018   Recent Labs    12/11/17 1143 12/26/17 0813 01/02/18 0945  01/04/18 0847 01/05/18 0506 01/08/18 0947  NA 136 136 133*   < > 139 140 141  K 4.8 4.3 4.7   < > 4.2 4.1 3.9  CL 102 103 100*   < > 108 111 104  CO2 23 26 19*   < > 20* 20* 28  GLUCOSE 104* 97 143*   < > 151* 92 101*  BUN 26* 20 98*   < > 77* 59* 37*  CREATININE 1.13 1.15 7.44*   < > 5.18* 3.53* 2.50*  CALCIUM 9.8 9.1 8.5*   < > 8.4* 8.1* 9.4  GFRNONAA >60 >60 7*   < > 11* 17* 26*  GFRAA >60 >60 8*   < > 12* 20*  30*  PROT 8.1 7.4 6.7  --   --   --   --   ALBUMIN 4.4 3.8 3.4*  --   --   --   --   AST 25 24 22   --   --   --   --   ALT 32 23 30  --   --   --   --   ALKPHOS 95 81 72  --   --   --   --   BILITOT 1.1 0.8 0.7  --   --   --   --    < > = values in this interval not displayed.     RADIOGRAPHIC STUDIES: I have personally reviewed the radiological images as listed and agreed with the findings in the report. PET scan 12/13/2017 1. Primary hypermetabolic mucosal lesion in the left oropharynx/tongue base with right-sided bulky multilevel lymphadenopathy. 2. No findings for metastatic disease involving the chest, abdomen, pelvis or bony structures.  ASSESSMENT & PLAN:  Clinically he has cT2 cN2 disease, stage II.  1. 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[place])   2. AKI (acute kidney injury) ([place])   3. Neoplasm related pain   Status post cycle 1 Cisplatin and had nephrotoxicity. Creatinine Discussed with patient to continue good oral hydration. For future chemotherapy, will switch to weekly carboplatin and taxol with concurrent RT. Side effects discussed with patient and he agrees with plan.  All questions were answered. The patient knows to call the clinic with any problems questions or concerns.  Return of visit: 01/16/2018 labs and evaluation prior to weekly Carboplatin and taxol.       Earlie Server, MD, PhD Hematology Oncology [facility] Pager- [phone] 01/08/2018

## 2018-01-08 ENCOUNTER — Ambulatory Visit: Payer: BLUE CROSS/BLUE SHIELD

## 2018-01-08 ENCOUNTER — Inpatient Hospital Stay (HOSPITAL_BASED_OUTPATIENT_CLINIC_OR_DEPARTMENT_OTHER): Payer: BLUE CROSS/BLUE SHIELD | Admitting: Oncology

## 2018-01-08 ENCOUNTER — Other Ambulatory Visit: Payer: Self-pay

## 2018-01-08 ENCOUNTER — Inpatient Hospital Stay: Payer: BLUE CROSS/BLUE SHIELD | Attending: Oncology

## 2018-01-08 ENCOUNTER — Inpatient Hospital Stay: Payer: BLUE CROSS/BLUE SHIELD

## 2018-01-08 ENCOUNTER — Encounter: Payer: Self-pay | Admitting: Oncology

## 2018-01-08 VITALS — BP 117/78 | HR 94 | Temp 98.3°F | Wt 212.7 lb

## 2018-01-08 DIAGNOSIS — N179 Acute kidney failure, unspecified: Secondary | ICD-10-CM

## 2018-01-08 DIAGNOSIS — G893 Neoplasm related pain (acute) (chronic): Secondary | ICD-10-CM

## 2018-01-08 DIAGNOSIS — C109 Malignant neoplasm of oropharynx, unspecified: Secondary | ICD-10-CM

## 2018-01-08 DIAGNOSIS — Z5111 Encounter for antineoplastic chemotherapy: Secondary | ICD-10-CM | POA: Diagnosis present

## 2018-01-08 LAB — BASIC METABOLIC PANEL
Anion gap: 9 (ref 5–15)
BUN: 37 mg/dL — AB (ref 6–20)
CHLORIDE: 104 mmol/L (ref 101–111)
CO2: 28 mmol/L (ref 22–32)
Calcium: 9.4 mg/dL (ref 8.9–10.3)
Creatinine, Ser: 2.5 mg/dL — ABNORMAL HIGH (ref 0.61–1.24)
GFR calc Af Amer: 30 mL/min — ABNORMAL LOW (ref >60)
GFR calc non Af Amer: 26 mL/min — ABNORMAL LOW (ref >60)
GLUCOSE: 101 mg/dL — AB (ref 65–99)
POTASSIUM: 3.9 mmol/L (ref 3.5–5.1)
Sodium: 141 mmol/L (ref 135–145)

## 2018-01-08 LAB — CBC WITH DIFFERENTIAL/PLATELET
Basophils Absolute: 0 10*3/uL (ref 0–0.1)
Basophils Relative: 1 %
EOS PCT: 1 %
Eosinophils Absolute: 0.1 10*3/uL (ref 0–0.7)
HCT: 39.4 % — ABNORMAL LOW (ref 40.0–52.0)
HEMOGLOBIN: 13.3 g/dL (ref 13.0–18.0)
LYMPHS ABS: 0.7 10*3/uL — AB (ref 1.0–3.6)
LYMPHS PCT: 9 %
MCH: 30 pg (ref 26.0–34.0)
MCHC: 33.7 g/dL (ref 32.0–36.0)
MCV: 89 fL (ref 80.0–100.0)
MONOS PCT: 5 %
Monocytes Absolute: 0.4 10*3/uL (ref 0.2–1.0)
Neutro Abs: 6.5 10*3/uL (ref 1.4–6.5)
Neutrophils Relative %: 84 %
Platelets: 168 10*3/uL (ref 150–440)
RBC: 4.42 MIL/uL (ref 4.40–5.90)
RDW: 13.3 % (ref 11.5–14.5)
WBC: 7.7 10*3/uL (ref 3.8–10.6)

## 2018-01-08 NOTE — Addendum Note (Signed)
Addended by: Earlie Server on: 01/08/2018 02:03 PM   Modules accepted: Orders

## 2018-01-08 NOTE — Progress Notes (Signed)
DISCONTINUE ON PATHWAY REGIMEN - Head and Neck     A cycle is every 21 days:     Cisplatin   **Always confirm dose/schedule in your pharmacy ordering system**    REASON: Toxicities / Adverse Event PRIOR TREATMENT: [id number]: Cisplatin 100 mg/m2 q21 Days x 3 Cycles with Concurrent Radiation TREATMENT RESPONSE: Unable to Evaluate  START ON PATHWAY REGIMEN - Head and Neck     Administer weekly:     Carboplatin   **Always confirm dose/schedule in your pharmacy ordering system**    Patient Characteristics: Oropharynx, HPV Positive, Clinically Staged, T0-4, cN1-3 or T3-4, cN0 Disease Classification: Oropharynx HPV Status: Positive (+) AJCC N Category: cN2 AJCC 8 Stage Grouping: II Current Disease Status: No Distant Metastases and No Recurrent Disease AJCC T Category: T2 AJCC M Category: M0 Intent of Therapy: Curative Intent, Discussed with Patient

## 2018-01-09 ENCOUNTER — Ambulatory Visit: Payer: BLUE CROSS/BLUE SHIELD

## 2018-01-09 ENCOUNTER — Ambulatory Visit
Admission: RE | Admit: 2018-01-09 | Discharge: 2018-01-09 | Disposition: A | Discharge status: Home / Self Care | Payer: BLUE CROSS/BLUE SHIELD | Source: Ambulatory Visit | Attending: Radiation Oncology | Admitting: Radiation Oncology

## 2018-01-09 ENCOUNTER — Other Ambulatory Visit: Payer: Self-pay | Admitting: *Deleted

## 2018-01-09 DIAGNOSIS — R221 Localized swelling, mass and lump, neck: Secondary | ICD-10-CM | POA: Diagnosis not present

## 2018-01-09 MED ORDER — SUCRALFATE 1 G PO TABS: 1.0000 g | ORAL_TABLET | Freq: Three times a day (TID) | ORAL | 5 refills | Status: DC

## 2018-01-10 ENCOUNTER — Ambulatory Visit
Admission: RE | Admit: 2018-01-10 | Discharge: 2018-01-10 | Disposition: A | Discharge status: Home / Self Care | Payer: BLUE CROSS/BLUE SHIELD | Source: Ambulatory Visit | Attending: Radiation Oncology | Admitting: Radiation Oncology

## 2018-01-10 ENCOUNTER — Ambulatory Visit: Payer: BLUE CROSS/BLUE SHIELD

## 2018-01-10 DIAGNOSIS — R221 Localized swelling, mass and lump, neck: Secondary | ICD-10-CM | POA: Diagnosis not present

## 2018-01-10 NOTE — Progress Notes (Signed)
DISCONTINUE ON PATHWAY REGIMEN - Head and Neck     Administer weekly:     Carboplatin   **Always confirm dose/schedule in your pharmacy ordering system**    REASON: Other Reason PRIOR TREATMENT: [id number]: Carboplatin AUC = 2 Weekly with Concurrent Radiation TREATMENT RESPONSE: Unable to Evaluate  START ON PATHWAY REGIMEN - Head and Neck     Administer weekly:     Carboplatin   **Always confirm dose/schedule in your pharmacy ordering system**    Patient Characteristics: Oropharynx, HPV Positive, Clinically Staged, T0-4, cN1-3 or T3-4, cN0 Disease Classification: Oropharynx HPV Status: Positive (+) AJCC N Category: cN2 AJCC 8 Stage Grouping: II Current Disease Status: No Distant Metastases and No Recurrent Disease AJCC T Category: T2 AJCC M Category: M0 Intent of Therapy: Curative Intent, Discussed with Patient

## 2018-01-11 ENCOUNTER — Ambulatory Visit: Payer: BLUE CROSS/BLUE SHIELD

## 2018-01-11 ENCOUNTER — Ambulatory Visit
Admission: RE | Admit: 2018-01-11 | Discharge: 2018-01-11 | Disposition: A | Discharge status: Home / Self Care | Payer: BLUE CROSS/BLUE SHIELD | Source: Ambulatory Visit | Attending: Radiation Oncology | Admitting: Radiation Oncology

## 2018-01-11 DIAGNOSIS — R221 Localized swelling, mass and lump, neck: Secondary | ICD-10-CM | POA: Diagnosis not present

## 2018-01-14 ENCOUNTER — Ambulatory Visit: Payer: BLUE CROSS/BLUE SHIELD

## 2018-01-14 ENCOUNTER — Ambulatory Visit
Admission: RE | Admit: 2018-01-14 | Discharge: 2018-01-14 | Disposition: A | Discharge status: Home / Self Care | Payer: BLUE CROSS/BLUE SHIELD | Source: Ambulatory Visit | Attending: Radiation Oncology | Admitting: Radiation Oncology

## 2018-01-14 DIAGNOSIS — R221 Localized swelling, mass and lump, neck: Secondary | ICD-10-CM | POA: Diagnosis not present

## 2018-01-14 NOTE — Progress Notes (Signed)
Hematology/Oncology  Follow Up note [facility] Telephone:([phone] Fax:([phone]   Patient Care Team: Albina Billet, MD as PCP - General (Internal Medicine) Albina Billet, MD (Internal Medicine) Bary Castilla Forest Gleason, MD (General Surgery)  REFERRING PROVIDER: Dr.McQueen CHIEF COMPLAINTS/PURPOSE OF CONSULTATION:  Evaluation of newly diagnosed head and neck cancer.  HISTORY OF PRESENTING ILLNESS:  Ivan Bacallao. is a  62 y.o.  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 Tami Ribas. With ENT examination, he was found to have midline tongue base is to come down to 2 Weekly which is 40 mg pedunculated mass, 3cm.  And had CT soft tissue neck which confirmed pronounced lymphadenopathy in the right neck including conglomerate partially necrotic nodal mass spanning level III, 4 and 5, measuring 5.2 x 5.5 x 3.7 cm. Additional pathological nodes and right level 2 about 2.5 cm. There is asymmetry in the right tonsillar region. # Biopsy of the right neck mass and pathology revealed squamous cancer. P16 positive.  Current Cancer Treatment Cisplatin 100 mg/m2 every 3 weeks with concurrent RT which started on 12/31/2017.  S/p one dose of Cisplatin. Cisplatin discontinued due to nephrotoxicity.    INTERVAL HISTORY Ivan Fulop Talha Iser. is a 62 y.o. male who has above history reviewed by me today presents for follow up visit for management of squamous carcinoma of oropharynx. S/p cycle 1 Cisplatin.  .  Creatinine gradually improves and currently is stable. He denies any sore throat, swallowing pain, chest pain or abdominal pain. Denies any fever, chills. His sister accompanied him today.  Review of Systems  Constitutional: Negative for chills, diaphoresis, fever and malaise/fatigue.  HENT: Negative for congestion, ear discharge, ear pain, hearing loss and tinnitus.        Denies any hearing loss  Eyes:  Negative for pain, discharge and redness.  Respiratory: Negative for cough, sputum production and shortness of breath.   Cardiovascular: Negative for chest pain, orthopnea and leg swelling.  Gastrointestinal: Negative for constipation, diarrhea, heartburn, nausea and vomiting.  Genitourinary: Negative for dysuria, frequency, hematuria and urgency.  Musculoskeletal: Negative for myalgias.  Skin: Negative for rash.  Neurological: Negative for dizziness, tremors and sensory change.  Endo/Heme/Allergies: Negative for polydipsia. Does not bruise/bleed easily.  Psychiatric/Behavioral: Negative for depression, substance abuse and suicidal ideas. The patient is not nervous/anxious.     MEDICAL HISTORY:  Past Medical History:  Diagnosis Date  . Arthritis   . Cancer ([place])    Head and neck cancer  . Hemorrhoids   . Hyperlipidemia   . Hypertension     SURGICAL HISTORY: Past Surgical History:  Procedure Laterality Date  . COLONOSCOPY WITH PROPOFOL N/A 04/04/2017   Procedure: COLONOSCOPY WITH PROPOFOL;  Surgeon: Robert Bellow, MD;  Location: [facility] ENDOSCOPY;  Service: Endoscopy;  Laterality: N/A;  . MANDIBLE SURGERY    . PORTA CATH INSERTION N/A 12/19/2017   Procedure: PORTA CATH INSERTION;  Surgeon: Algernon Huxley, MD;  Location: [facility];  Service: Cardiovascular;  Laterality: N/A;  . TONSILLECTOMY      SOCIAL HISTORY: Social History   Socioeconomic History  . Marital status: Single    Spouse name: Not on file  . Number of children: Not on file  . Years of education: Not on file  . Highest education level: Not on file  Social Needs  . Financial resource strain: Not on file  . Food insecurity - worry:  Not on file  . Food insecurity - inability: Not on file  . Transportation needs - medical: Not on file  . Transportation needs - non-medical: Not on file  Occupational History  . Not on file  Tobacco Use  . Smoking status: Never Smoker  . Smokeless tobacco: Never Used    Substance and Sexual Activity  . Alcohol use: No  . Drug use: No  . Sexual activity: Not on file  Other Topics Concern  . Not on file  Social History Narrative  . Not on file    FAMILY HISTORY: Family History  Problem Relation Age of Onset  . Breast cancer Mother   . Asthma Mother   . Congestive Heart Failure Mother   . Prostate cancer Father   . Brain cancer Father   . Bladder Cancer Father     ALLERGIES:  has No Known Allergies.  MEDICATIONS:  Current Outpatient Medications  Medication Sig Dispense Refill  . amLODipine (NORVASC) 2.5 MG tablet Take 1 tablet (2.5 mg total) by mouth daily. 30 tablet 0  . lansoprazole (PREVACID) 30 MG capsule Take 1 capsule (30 mg total) by mouth daily at 12 noon. (Patient not taking: Reported on 01/08/2018) 30 capsule 4  . Multiple Vitamins-Minerals (MULTIVITAMIN WITH MINERALS) tablet Take 1 tablet by mouth daily.    Marland Kitchen oxyCODONE-acetaminophen (PERCOCET/ROXICET) 5-325 MG tablet Take 1 tablet by mouth every 6 (six) hours as needed for severe pain. (Patient not taking: Reported on 01/08/2018) 56 tablet 0  . rosuvastatin (CRESTOR) 10 MG tablet Take 10 mg by mouth every evening.  4  . sucralfate (CARAFATE) 1 g tablet Take 1 tablet (1 g total) by mouth 3 (three) times daily before meals. 90 tablet 5   No current facility-administered medications for this visit.      PHYSICAL EXAMINATION: ECOG PERFORMANCE STATUS: 0 - Asymptomatic There were no vitals filed for this visit. There were no vitals filed for this visit.  Physical Exam  Constitutional: He is oriented to person, place, and time. No distress.  HENT:  Head: Normocephalic and atraumatic.  Mouth/Throat: Oropharynx is clear and moist. No oropharyngeal exudate.  Eyes: Conjunctivae and EOM are normal. Pupils are equal, round, and reactive to light. Left eye exhibits no discharge. No scleral icterus.  Neck: Normal range of motion. Neck supple. No JVD present.  Right side neck mass. Tongue base  mass.  Cardiovascular: Normal rate and regular rhythm. Exam reveals no friction rub.  No murmur heard. Pulmonary/Chest: Breath sounds normal. He has no wheezes. He has no rales.  Abdominal: Soft. Bowel sounds are normal. He exhibits no distension. There is no tenderness. There is no rebound.  Musculoskeletal: Normal range of motion. He exhibits no edema or tenderness.  Lymphadenopathy:    He has cervical adenopathy.  Neurological: He is alert and oriented to person, place, and time. No cranial nerve deficit. Gait normal.  Skin: Skin is dry. No rash noted. He is not diaphoretic. No erythema.  Psychiatric: Memory and affect normal.     LABORATORY DATA:  I have reviewed the data as listed Lab Results  Component Value Date   WBC 7.7 01/08/2018   HGB 13.3 01/08/2018   HCT 39.4 (L) 01/08/2018   MCV 89.0 01/08/2018   PLT 168 01/08/2018   Recent Labs    12/11/17 1143 12/26/17 0813 01/02/18 0945  01/04/18 0847 01/05/18 0506 01/08/18 0947  NA 136 136 133*   < > 139 140 141  K 4.8 4.3 4.7   < >  4.2 4.1 3.9  CL 102 103 100*   < > 108 111 104  CO2 23 26 19*   < > 20* 20* 28  GLUCOSE 104* 97 143*   < > 151* 92 101*  BUN 26* 20 98*   < > 77* 59* 37*  CREATININE 1.13 1.15 7.44*   < > 5.18* 3.53* 2.50*  CALCIUM 9.8 9.1 8.5*   < > 8.4* 8.1* 9.4  GFRNONAA >60 >60 7*   < > 11* 17* 26*  GFRAA >60 >60 8*   < > 12* 20* 30*  PROT 8.1 7.4 6.7  --   --   --   --   ALBUMIN 4.4 3.8 3.4*  --   --   --   --   AST 25 24 22   --   --   --   --   ALT 32 23 30  --   --   --   --   ALKPHOS 95 81 72  --   --   --   --   BILITOT 1.1 0.8 0.7  --   --   --   --    < > = values in this interval not displayed.     RADIOGRAPHIC STUDIES: I have personally reviewed the radiological images as listed and agreed with the findings in the report. PET scan 12/13/2017 1. Primary hypermetabolic mucosal lesion in the left oropharynx/tongue base with right-sided bulky multilevel lymphadenopathy. 2. No findings for  metastatic disease involving the chest, abdomen, pelvis or bony structures.  ASSESSMENT & PLAN:  Clinically he has cT2 cN2 disease, stage II.  1. 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[place])   2. Encounter for antineoplastic chemotherapy   Status post cycle 1 Cisplatin, treatment discontinued due to nephrotoxicity.  Switch to weekly carbo AUC 2 and Taxokl 45mg /m2,  I explained to the patient the risks and benefits of chemotherapy including all but not limited to mouth sore, nausea, vomiting, low blood counts, bleeding, and risk of life threatening infection and even death, infusion reactions, etc.   Patient voices understanding and agrees with plan. Proceed with [place] and Taxol on 01/16/2018.   Continue follow up with RadOnc for RT.  No signs of dysphagia, continue monitor.  All questions were answered. The patient knows to call the clinic with any problems questions or concerns.  Return of visit:  01/23/18 labs and evaluation prior to weekly Carboplatin and taxol.       Earlie Server, MD, PhD Hematology Oncology [facility] Pager- [phone] 01/15/2018

## 2018-01-15 ENCOUNTER — Encounter: Payer: Self-pay | Admitting: Oncology

## 2018-01-15 ENCOUNTER — Inpatient Hospital Stay (HOSPITAL_BASED_OUTPATIENT_CLINIC_OR_DEPARTMENT_OTHER): Payer: BLUE CROSS/BLUE SHIELD | Admitting: Oncology

## 2018-01-15 ENCOUNTER — Ambulatory Visit
Admission: RE | Admit: 2018-01-15 | Discharge: 2018-01-15 | Disposition: A | Discharge status: Home / Self Care | Payer: BLUE CROSS/BLUE SHIELD | Source: Ambulatory Visit | Attending: Radiation Oncology | Admitting: Radiation Oncology

## 2018-01-15 ENCOUNTER — Ambulatory Visit: Payer: BLUE CROSS/BLUE SHIELD

## 2018-01-15 ENCOUNTER — Inpatient Hospital Stay: Payer: BLUE CROSS/BLUE SHIELD

## 2018-01-15 VITALS — BP 125/87 | HR 110 | Temp 97.1°F | Resp 18 | Wt 209.7 lb

## 2018-01-15 DIAGNOSIS — C109 Malignant neoplasm of oropharynx, unspecified: Secondary | ICD-10-CM

## 2018-01-15 DIAGNOSIS — R221 Localized swelling, mass and lump, neck: Secondary | ICD-10-CM | POA: Diagnosis not present

## 2018-01-15 DIAGNOSIS — Z5111 Encounter for antineoplastic chemotherapy: Secondary | ICD-10-CM

## 2018-01-15 LAB — COMPREHENSIVE METABOLIC PANEL
ALK PHOS: 92 U/L (ref 38–126)
ALT: 19 U/L (ref 17–63)
ANION GAP: 9 (ref 5–15)
AST: 20 U/L (ref 15–41)
Albumin: 3.8 g/dL (ref 3.5–5.0)
BUN: 41 mg/dL — ABNORMAL HIGH (ref 6–20)
CALCIUM: 9.2 mg/dL (ref 8.9–10.3)
CO2: 24 mmol/L (ref 22–32)
Chloride: 102 mmol/L (ref 101–111)
Creatinine, Ser: 2.48 mg/dL — ABNORMAL HIGH (ref 0.61–1.24)
GFR, EST AFRICAN AMERICAN: 30 mL/min — AB (ref >60)
GFR, EST NON AFRICAN AMERICAN: 26 mL/min — AB (ref >60)
Glucose, Bld: 118 mg/dL — ABNORMAL HIGH (ref 65–99)
Potassium: 4.3 mmol/L (ref 3.5–5.1)
Sodium: 135 mmol/L (ref 135–145)
TOTAL PROTEIN: 7.5 g/dL (ref 6.5–8.1)
Total Bilirubin: 0.8 mg/dL (ref 0.3–1.2)

## 2018-01-15 LAB — CBC WITH DIFFERENTIAL/PLATELET
Basophils Absolute: 0 10*3/uL (ref 0–0.1)
Basophils Relative: 1 %
EOS ABS: 0.2 10*3/uL (ref 0–0.7)
EOS PCT: 5 %
HCT: 39.6 % — ABNORMAL LOW (ref 40.0–52.0)
HEMOGLOBIN: 13.5 g/dL (ref 13.0–18.0)
LYMPHS ABS: 0.6 10*3/uL — AB (ref 1.0–3.6)
LYMPHS PCT: 17 %
MCH: 29.9 pg (ref 26.0–34.0)
MCHC: 34 g/dL (ref 32.0–36.0)
MCV: 88 fL (ref 80.0–100.0)
MONOS PCT: 11 %
Monocytes Absolute: 0.4 10*3/uL (ref 0.2–1.0)
NEUTROS PCT: 66 %
Neutro Abs: 2.4 10*3/uL (ref 1.4–6.5)
Platelets: 269 10*3/uL (ref 150–440)
RBC: 4.5 MIL/uL (ref 4.40–5.90)
RDW: 13.5 % (ref 11.5–14.5)
WBC: 3.6 10*3/uL — AB (ref 3.8–10.6)

## 2018-01-15 NOTE — Progress Notes (Signed)
Patient here today for follow up.  Patient states no new concerns today  

## 2018-01-16 ENCOUNTER — Inpatient Hospital Stay: Payer: BLUE CROSS/BLUE SHIELD

## 2018-01-16 ENCOUNTER — Ambulatory Visit: Payer: BLUE CROSS/BLUE SHIELD

## 2018-01-16 ENCOUNTER — Ambulatory Visit
Admission: RE | Admit: 2018-01-16 | Discharge: 2018-01-16 | Disposition: A | Discharge status: Home / Self Care | Payer: BLUE CROSS/BLUE SHIELD | Source: Ambulatory Visit | Attending: Radiation Oncology | Admitting: Radiation Oncology

## 2018-01-16 VITALS — BP 128/85 | HR 71 | Temp 97.9°F | Resp 20

## 2018-01-16 DIAGNOSIS — C109 Malignant neoplasm of oropharynx, unspecified: Secondary | ICD-10-CM

## 2018-01-16 DIAGNOSIS — R221 Localized swelling, mass and lump, neck: Secondary | ICD-10-CM | POA: Diagnosis not present

## 2018-01-16 MED ORDER — SODIUM CHLORIDE 0.9 % IV SOLN: 10.0000 mg | Freq: Once | INTRAVENOUS | Status: DC

## 2018-01-16 MED ORDER — DEXAMETHASONE SODIUM PHOSPHATE 10 MG/ML IJ SOLN
10.0000 mg | Freq: Once | INTRAMUSCULAR | Status: AC
  Administered 2018-01-16: 10 mg via INTRAVENOUS
  Filled 2018-01-16: qty 1

## 2018-01-16 MED ORDER — SODIUM CHLORIDE 0.9 % IV SOLN
45.0000 mg/m2 | Freq: Once | INTRAVENOUS | Status: AC
  Administered 2018-01-16: 96 mg via INTRAVENOUS
  Filled 2018-01-16: qty 16

## 2018-01-16 MED ORDER — SODIUM CHLORIDE 0.9 % IV SOLN: 50.0000 mg | Freq: Once | INTRAVENOUS | Status: DC

## 2018-01-16 MED ORDER — HEPARIN SOD (PORK) LOCK FLUSH 100 UNIT/ML IV SOLN
INTRAVENOUS | Status: AC
Start: 2018-01-16 — End: 2018-01-16
  Filled 2018-01-16: qty 5

## 2018-01-16 MED ORDER — PALONOSETRON HCL INJECTION 0.25 MG/5ML
0.2500 mg | Freq: Once | INTRAVENOUS | Status: AC
  Administered 2018-01-16: 0.25 mg via INTRAVENOUS
  Filled 2018-01-16: qty 5

## 2018-01-16 MED ORDER — DIPHENHYDRAMINE HCL 50 MG/ML IJ SOLN
50.0000 mg | Freq: Once | INTRAMUSCULAR | Status: AC
  Administered 2018-01-16: 50 mg via INTRAVENOUS
  Filled 2018-01-16: qty 1

## 2018-01-16 MED ORDER — CARBOPLATIN CHEMO INJECTION 450 MG/45ML
134.4000 mg | Freq: Once | INTRAVENOUS | Status: AC
  Administered 2018-01-16: 130 mg via INTRAVENOUS
  Filled 2018-01-16: qty 13

## 2018-01-16 MED ORDER — FAMOTIDINE IN NACL 20-0.9 MG/50ML-% IV SOLN
20.0000 mg | Freq: Two times a day (BID) | INTRAVENOUS | Status: DC
  Administered 2018-01-16: 20 mg via INTRAVENOUS
  Filled 2018-01-16: qty 50

## 2018-01-16 MED ORDER — SODIUM CHLORIDE 0.9 % IV SOLN
Freq: Once | INTRAVENOUS | Status: AC
  Administered 2018-01-16: 10:00:00 via INTRAVENOUS
  Filled 2018-01-16: qty 1000

## 2018-01-16 MED ORDER — HEPARIN SOD (PORK) LOCK FLUSH 100 UNIT/ML IV SOLN
500.0000 [IU] | Freq: Once | INTRAVENOUS | Status: AC | PRN
  Administered 2018-01-16: 500 [IU]

## 2018-01-17 ENCOUNTER — Ambulatory Visit
Admission: RE | Admit: 2018-01-17 | Discharge: 2018-01-17 | Disposition: A | Discharge status: Home / Self Care | Payer: BLUE CROSS/BLUE SHIELD | Source: Ambulatory Visit | Attending: Radiation Oncology | Admitting: Radiation Oncology

## 2018-01-17 ENCOUNTER — Ambulatory Visit: Payer: BLUE CROSS/BLUE SHIELD

## 2018-01-17 ENCOUNTER — Inpatient Hospital Stay: Payer: BLUE CROSS/BLUE SHIELD

## 2018-01-17 DIAGNOSIS — R221 Localized swelling, mass and lump, neck: Secondary | ICD-10-CM | POA: Diagnosis not present

## 2018-01-17 NOTE — Progress Notes (Signed)
Nutrition Follow-up:  Patient with oropharynx cancer, HPV positive.  Patient followed by Dr. Tasia Catchings.  Patient receiving radiation therapy and chemotherapy (carbo/taxol) new regimen.    Met with patient and sister following radiation appointment.  Sister is helping patient with preparing meals.  Patient reports appetite is still good, eating smaller more frequent meals of all consistency foods.  Patient reports no sore throat, some dry mouth, no sores in mouth.  Patient reports taste has changed.  Patient reports has tried shakes with almond milk, protein powder, ice cream, fruit, peanut butter.  Also reports eating lasagna, baked chicken, vegetables, popcorn, grits.  Reports that he is drinking mostly water and some powerade, gatorade does not taste good.  Also reports drinking 1 ensure plus per day.  Does not drink sweet tea or regular sodas anymore.    Reports one episode of nausea but medication helped.  No nausea currently.   Reports that he remains active but has not gotten back to exercise class.     Medications: reviewed, using carafate, rinsing with baking soda, salt and water  Labs: reviewed  Anthropometrics:   Weight has decreased to 210 lb taken today on radiation scales from UBW of 220s 217 lb on 1/23 on initial assessment  Patient reports weight loss has come from changing intake to less calorie rich foods and more nutrient dense foods.  NUTRITION DIAGNOSIS: Predicted suboptimal intake continues with continuing treatment   MALNUTRITION DIAGNOSIS: none at this time   INTERVENTION:   Discussed "sugar feeds cancer" with sister and patient as sister concerned about this. Handout given to explain as well.   Sister concerned about soy foods as well. Discussed current research and provided handout on soy foods from [employer].  Encouraged patient to continue to liberalize diet at this time to get adequate calories and protein to maintain weight during remainder of  treatment. Provided samples of unjury and discussed orgain shakes and protein powder as options for patient to use as well.   2nd case of ensure plus given to patient today.      MONITORING, EVALUATION, GOAL: patient will consume adequate calories and protein to meet nutritional needs and maintain weight during treatment.   NEXT VISIT: Feb 21 after radiation  Ivan Garcia, [place], [place] Registered Dietitian [phone] (pager)

## 2018-01-18 ENCOUNTER — Ambulatory Visit
Admission: RE | Admit: 2018-01-18 | Discharge: 2018-01-18 | Disposition: A | Discharge status: Home / Self Care | Payer: BLUE CROSS/BLUE SHIELD | Source: Ambulatory Visit | Attending: Radiation Oncology | Admitting: Radiation Oncology

## 2018-01-18 ENCOUNTER — Ambulatory Visit: Payer: BLUE CROSS/BLUE SHIELD

## 2018-01-18 DIAGNOSIS — R221 Localized swelling, mass and lump, neck: Secondary | ICD-10-CM | POA: Diagnosis not present

## 2018-01-21 ENCOUNTER — Ambulatory Visit: Payer: BLUE CROSS/BLUE SHIELD

## 2018-01-21 ENCOUNTER — Ambulatory Visit
Admission: RE | Admit: 2018-01-21 | Discharge: 2018-01-21 | Disposition: A | Discharge status: Home / Self Care | Payer: BLUE CROSS/BLUE SHIELD | Source: Ambulatory Visit | Attending: Radiation Oncology | Admitting: Radiation Oncology

## 2018-01-21 DIAGNOSIS — R221 Localized swelling, mass and lump, neck: Secondary | ICD-10-CM | POA: Diagnosis not present

## 2018-01-22 ENCOUNTER — Ambulatory Visit
Admission: RE | Admit: 2018-01-22 | Discharge: 2018-01-22 | Disposition: A | Discharge status: Home / Self Care | Payer: BLUE CROSS/BLUE SHIELD | Source: Ambulatory Visit | Attending: Radiation Oncology | Admitting: Radiation Oncology

## 2018-01-22 ENCOUNTER — Ambulatory Visit: Payer: BLUE CROSS/BLUE SHIELD

## 2018-01-22 DIAGNOSIS — R221 Localized swelling, mass and lump, neck: Secondary | ICD-10-CM | POA: Diagnosis not present

## 2018-01-22 NOTE — Progress Notes (Signed)
Hematology/Oncology  Follow Up note [facility] Telephone:(336) [phone] Fax:(336) [phone]   Patient Care Team: Albina Billet, MD as PCP - General (Internal Medicine) Albina Billet, MD (Internal Medicine) Bary Castilla Forest Gleason, MD (General Surgery)  REFERRING PROVIDER: Dr.McQueen CHIEF COMPLAINTS/PURPOSE OF CONSULTATION:  Evaluation of newly diagnosed head and neck cancer.  HISTORY OF PRESENTING ILLNESS:  Ivan Wrinkle. is a  62 y.o.  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 Tami Ribas. With ENT examination, he was found to have midline tongue base is to come down to 2 Weekly which is 40 mg pedunculated mass, 3cm.  And had CT soft tissue neck which confirmed pronounced lymphadenopathy in the right neck including conglomerate partially necrotic nodal mass spanning level III, 4 and 5, measuring 5.2 x 5.5 x 3.7 cm. Additional pathological nodes and right level 2 about 2.5 cm. There is asymmetry in the right tonsillar region. # Biopsy of the right neck mass and pathology revealed squamous cancer. P16 positive.  Current Cancer Treatment Cisplatin 100 mg/m2 every 3 weeks with concurrent RT which started on 12/31/2017.  S/p one dose of Cisplatin. Cisplatin discontinued due to nephrotoxicity.  Switch to weekly Carboplatin (AUC 2) / Taxol 71mmg/m2    INTERVAL HISTORY Ivan Garcia. is a 62 y.o. male who has above history reviewed by me today presents for follow up visit for management of squamous carcinoma of oropharynx. S/p cycle 1 Cisplatin.  .  Creatinine gradually improves and currently is stable. Treatment was switched to Carboplatin (AUC 2) / Taxol 45mg /m2 s/p 1 treatment. Doing well. He has sore throat, swallowing pain. Not drinking much fluid. No chest pain/abdominal pain, fever or chills.    Review of Systems  Constitutional: Negative for chills, diaphoresis, fever and  malaise/fatigue.  HENT: Negative for congestion, ear discharge, ear pain, hearing loss and tinnitus.        Sore throat  Eyes: Negative for pain, discharge and redness.  Respiratory: Negative for cough, sputum production and shortness of breath.   Cardiovascular: Negative for chest pain, orthopnea and leg swelling.  Gastrointestinal: Negative for constipation, diarrhea, heartburn, nausea and vomiting.  Genitourinary: Negative for dysuria, frequency, hematuria and urgency.  Musculoskeletal: Negative for myalgias.  Skin: Negative for rash.  Neurological: Negative for dizziness, tremors and sensory change.  Endo/Heme/Allergies: Negative for polydipsia. Does not bruise/bleed easily.  Psychiatric/Behavioral: Negative for depression, substance abuse and suicidal ideas. The patient is not nervous/anxious.     MEDICAL HISTORY:  Past Medical History:  Diagnosis Date  . Arthritis   . Cancer ([place])    Head and neck cancer  . Hemorrhoids   . Hyperlipidemia   . Hypertension     SURGICAL HISTORY: Past Surgical History:  Procedure Laterality Date  . COLONOSCOPY WITH PROPOFOL N/A 04/04/2017   Procedure: COLONOSCOPY WITH PROPOFOL;  Surgeon: Robert Bellow, MD;  Location: [facility] ENDOSCOPY;  Service: Endoscopy;  Laterality: N/A;  . MANDIBLE SURGERY    . PORTA CATH INSERTION N/A 12/19/2017   Procedure: PORTA CATH INSERTION;  Surgeon: Algernon Huxley, MD;  Location: [place] CV LAB;  Service: Cardiovascular;  Laterality: N/A;  . TONSILLECTOMY      SOCIAL HISTORY: Social History   Socioeconomic History  . Marital status: Single    Spouse name: Not on file  . Number of children: Not on file  . Years of education: Not on file  . Highest education  level: Not on file  Social Needs  . Financial resource strain: Not on file  . Food insecurity - worry: Not on file  . Food insecurity - inability: Not on file  . Transportation needs - medical: Not on file  . Transportation needs - non-medical: Not  on file  Occupational History  . Not on file  Tobacco Use  . Smoking status: Never Smoker  . Smokeless tobacco: Never Used  Substance and Sexual Activity  . Alcohol use: No  . Drug use: No  . Sexual activity: Not on file  Other Topics Concern  . Not on file  Social History Narrative  . Not on file    FAMILY HISTORY: Family History  Problem Relation Age of Onset  . Breast cancer Mother   . Asthma Mother   . Congestive Heart Failure Mother   . Prostate cancer Father   . Brain cancer Father   . Bladder Cancer Father     ALLERGIES:  has No Known Allergies.  MEDICATIONS:  Current Outpatient Medications  Medication Sig Dispense Refill  . amLODipine (NORVASC) 2.5 MG tablet Take 1 tablet (2.5 mg total) by mouth daily. 30 tablet 0  . lansoprazole (PREVACID) 30 MG capsule Take 1 capsule (30 mg total) by mouth daily at 12 noon. 30 capsule 4  . Multiple Vitamins-Minerals (MULTIVITAMIN WITH MINERALS) tablet Take 1 tablet by mouth daily.    Marland Kitchen oxyCODONE-acetaminophen (PERCOCET/ROXICET) 5-325 MG tablet Take 1 tablet by mouth every 6 (six) hours as needed for severe pain. 56 tablet 0  . rosuvastatin (CRESTOR) 10 MG tablet Take 10 mg by mouth every evening.  4  . sucralfate (CARAFATE) 1 g tablet Take 1 tablet (1 g total) by mouth 3 (three) times daily before meals. 90 tablet 5  . lidocaine (XYLOCAINE) 2 % solution Use as directed 20 mLs in the mouth or throat every 4 (four) hours as needed for mouth pain (throat pain). 300 mL 0  . magic mouthwash SOLN Take 5 mLs by mouth 3 (three) times daily. 100 mL 0   No current facility-administered medications for this visit.    Facility-Administered Medications Ordered in Other Visits  Medication Dose Route Frequency Provider Last Rate Last Dose  . heparin lock flush 100 unit/mL  500 Units Intravenous Once Earlie Server, MD      . sodium chloride flush (NS) 0.9 % injection 10 mL  10 mL Intravenous PRN Earlie Server, MD   10 mL at 01/23/18 0824      PHYSICAL EXAMINATION: ECOG PERFORMANCE STATUS: 0 - Asymptomatic Vitals:   01/23/18 0838 01/23/18 0848  BP:  114/80  Pulse:  (!) 109  Resp: 12   Temp:  (!) 97 F (36.1 C)   Filed Weights   01/23/18 0838  Weight: 207 lb 9.6 oz (94.2 kg)    Physical Exam  Constitutional: He is oriented to person, place, and time. No distress.  HENT:  Head: Normocephalic and atraumatic.  Mouth/Throat: Oropharynx is clear and moist. No oropharyngeal exudate.  Eyes: Conjunctivae and EOM are normal. Pupils are equal, round, and reactive to light. Left eye exhibits no discharge. No scleral icterus.  Neck: Normal range of motion. Neck supple. No JVD present.  Right side neck mass. Tongue base mass.  Cardiovascular: Regular rhythm. Exam reveals no friction rub.  No murmur heard. tachycardia  Pulmonary/Chest: Breath sounds normal. He has no wheezes. He has no rales.  Abdominal: Soft. Bowel sounds are normal. He exhibits no distension. There is no  tenderness. There is no rebound.  Musculoskeletal: Normal range of motion. He exhibits no edema or tenderness.  Lymphadenopathy:    He has cervical adenopathy.  Neurological: He is alert and oriented to person, place, and time. No cranial nerve deficit. Gait normal.  Skin: Skin is dry. No rash noted. He is not diaphoretic. No erythema.  Psychiatric: Memory and affect normal.     LABORATORY DATA:  I have reviewed the data as listed Lab Results  Component Value Date   WBC 5.4 01/23/2018   HGB 12.5 (L) 01/23/2018   HCT 36.3 (L) 01/23/2018   MCV 87.7 01/23/2018   PLT 190 01/23/2018   Recent Labs    01/02/18 0945  01/08/18 0947 01/15/18 0848 01/23/18 0832  NA 133*   < > 141 135 138  K 4.7   < > 3.9 4.3 4.2  CL 100*   < > 104 102 106  CO2 19*   < > 28 24 26   GLUCOSE 143*   < > 101* 118* 113*  BUN 98*   < > 37* 41* 39*  CREATININE 7.44*   < > 2.50* 2.48* 2.42*  CALCIUM 8.5*   < > 9.4 9.2 9.3  GFRNONAA 7*   < > 26* 26* 27*  GFRAA 8*   < >  30* 30* 31*  PROT 6.7  --   --  7.5 7.2  ALBUMIN 3.4*  --   --  3.8 3.7  AST 22  --   --  20 19  ALT 30  --   --  19 16*  ALKPHOS 72  --   --  92 88  BILITOT 0.7  --   --  0.8 0.6   < > = values in this interval not displayed.     RADIOGRAPHIC STUDIES: I have personally reviewed the radiological images as listed and agreed with the findings in the report. PET scan 12/13/2017 1. Primary hypermetabolic mucosal lesion in the left oropharynx/tongue base with right-sided bulky multilevel lymphadenopathy. 2. No findings for metastatic disease involving the chest, abdomen, pelvis or bony structures.  ASSESSMENT & PLAN:  Clinically he has cT2 cN2 disease, stage II.  1. 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[place])   2. Encounter for antineoplastic chemotherapy   3. Sore throat   4. Tachycardia   Status post cycle 1 Carbo + taxol, tolerating well.   Proceed with cycle 2 today.  Sore throat/Esophagitis due to RT. Added magic mouth wash PRN. If not working he may use lidocain viscous solution as needed.  # Tachycardia: secondary to poor oral hydration. Discussed the importance and he voices understanding.  Will give him 1L NS along with treatment today.    Continue follow up with RadOnc for RT.   All questions were answered. The patient knows to call the clinic with any problems questions or concerns.  Return of visit: 1 week with labs and evaluation prior to weekly Carboplatin and taxol.       Earlie Server, MD, PhD Hematology Oncology [facility] Pager- [phone] 01/23/2018

## 2018-01-23 ENCOUNTER — Inpatient Hospital Stay: Payer: BLUE CROSS/BLUE SHIELD

## 2018-01-23 ENCOUNTER — Ambulatory Visit
Admission: RE | Admit: 2018-01-23 | Discharge: 2018-01-23 | Disposition: A | Discharge status: Home / Self Care | Payer: BLUE CROSS/BLUE SHIELD | Source: Ambulatory Visit | Attending: Radiation Oncology | Admitting: Radiation Oncology

## 2018-01-23 ENCOUNTER — Inpatient Hospital Stay (HOSPITAL_BASED_OUTPATIENT_CLINIC_OR_DEPARTMENT_OTHER): Payer: BLUE CROSS/BLUE SHIELD | Admitting: Oncology

## 2018-01-23 ENCOUNTER — Ambulatory Visit: Payer: BLUE CROSS/BLUE SHIELD

## 2018-01-23 ENCOUNTER — Other Ambulatory Visit: Payer: Self-pay

## 2018-01-23 ENCOUNTER — Encounter: Payer: Self-pay | Admitting: Oncology

## 2018-01-23 VITALS — BP 114/80 | HR 109 | Temp 97.0°F | Resp 12 | Ht 70.0 in | Wt 207.6 lb

## 2018-01-23 DIAGNOSIS — I10 Essential (primary) hypertension: Secondary | ICD-10-CM

## 2018-01-23 DIAGNOSIS — R070 Pain in throat: Secondary | ICD-10-CM | POA: Diagnosis not present

## 2018-01-23 DIAGNOSIS — J029 Acute pharyngitis, unspecified: Secondary | ICD-10-CM

## 2018-01-23 DIAGNOSIS — Z5111 Encounter for antineoplastic chemotherapy: Secondary | ICD-10-CM

## 2018-01-23 DIAGNOSIS — C109 Malignant neoplasm of oropharynx, unspecified: Secondary | ICD-10-CM | POA: Diagnosis not present

## 2018-01-23 DIAGNOSIS — R000 Tachycardia, unspecified: Secondary | ICD-10-CM

## 2018-01-23 DIAGNOSIS — R221 Localized swelling, mass and lump, neck: Secondary | ICD-10-CM | POA: Diagnosis not present

## 2018-01-23 DIAGNOSIS — K209 Esophagitis, unspecified: Secondary | ICD-10-CM | POA: Diagnosis not present

## 2018-01-23 LAB — CBC WITH DIFFERENTIAL/PLATELET
BASOS PCT: 1 %
Basophils Absolute: 0.1 10*3/uL (ref 0–0.1)
Eosinophils Absolute: 0.1 10*3/uL (ref 0–0.7)
Eosinophils Relative: 2 %
HEMATOCRIT: 36.3 % — AB (ref 40.0–52.0)
HEMOGLOBIN: 12.5 g/dL — AB (ref 13.0–18.0)
LYMPHS PCT: 8 %
Lymphs Abs: 0.4 10*3/uL — ABNORMAL LOW (ref 1.0–3.6)
MCH: 30.3 pg (ref 26.0–34.0)
MCHC: 34.5 g/dL (ref 32.0–36.0)
MCV: 87.7 fL (ref 80.0–100.0)
MONO ABS: 0.4 10*3/uL (ref 0.2–1.0)
MONOS PCT: 7 %
NEUTROS ABS: 4.4 10*3/uL (ref 1.4–6.5)
NEUTROS PCT: 82 %
Platelets: 190 10*3/uL (ref 150–440)
RBC: 4.14 MIL/uL — ABNORMAL LOW (ref 4.40–5.90)
RDW: 13.5 % (ref 11.5–14.5)
WBC: 5.4 10*3/uL (ref 3.8–10.6)

## 2018-01-23 LAB — COMPREHENSIVE METABOLIC PANEL
ALBUMIN: 3.7 g/dL (ref 3.5–5.0)
ALT: 16 U/L — ABNORMAL LOW (ref 17–63)
ANION GAP: 6 (ref 5–15)
AST: 19 U/L (ref 15–41)
Alkaline Phosphatase: 88 U/L (ref 38–126)
BUN: 39 mg/dL — ABNORMAL HIGH (ref 6–20)
CALCIUM: 9.3 mg/dL (ref 8.9–10.3)
CO2: 26 mmol/L (ref 22–32)
Chloride: 106 mmol/L (ref 101–111)
Creatinine, Ser: 2.42 mg/dL — ABNORMAL HIGH (ref 0.61–1.24)
GFR calc non Af Amer: 27 mL/min — ABNORMAL LOW (ref >60)
GFR, EST AFRICAN AMERICAN: 31 mL/min — AB (ref >60)
GLUCOSE: 113 mg/dL — AB (ref 65–99)
POTASSIUM: 4.2 mmol/L (ref 3.5–5.1)
SODIUM: 138 mmol/L (ref 135–145)
TOTAL PROTEIN: 7.2 g/dL (ref 6.5–8.1)
Total Bilirubin: 0.6 mg/dL (ref 0.3–1.2)

## 2018-01-23 MED ORDER — DEXAMETHASONE SODIUM PHOSPHATE 10 MG/ML IJ SOLN
10.0000 mg | Freq: Once | INTRAMUSCULAR | Status: AC
  Administered 2018-01-23: 10 mg via INTRAVENOUS
  Filled 2018-01-23: qty 1

## 2018-01-23 MED ORDER — MAGIC MOUTHWASH
5.0000 mL | Freq: Three times a day (TID) | ORAL | 0 refills | Status: DC
Start: 2018-01-23 — End: 2018-03-20

## 2018-01-23 MED ORDER — DIPHENHYDRAMINE HCL 50 MG/ML IJ SOLN
50.0000 mg | Freq: Once | INTRAMUSCULAR | Status: AC
  Administered 2018-01-23: 50 mg via INTRAVENOUS
  Filled 2018-01-23: qty 1

## 2018-01-23 MED ORDER — HEPARIN SOD (PORK) LOCK FLUSH 100 UNIT/ML IV SOLN
500.0000 [IU] | Freq: Once | INTRAVENOUS | Status: AC
  Administered 2018-01-23: 500 [IU] via INTRAVENOUS
  Filled 2018-01-23: qty 5

## 2018-01-23 MED ORDER — SODIUM CHLORIDE 0.9 % IV SOLN
45.0000 mg/m2 | Freq: Once | INTRAVENOUS | Status: AC
  Administered 2018-01-23: 96 mg via INTRAVENOUS
  Filled 2018-01-23: qty 16

## 2018-01-23 MED ORDER — DIPHENHYDRAMINE HCL 50 MG/ML IJ SOLN: 50.0000 mg | Freq: Once | INTRAMUSCULAR | Status: DC

## 2018-01-23 MED ORDER — SODIUM CHLORIDE 0.9% FLUSH
10.0000 mL | INTRAVENOUS | Status: DC | PRN
  Administered 2018-01-23: 10 mL via INTRAVENOUS
  Filled 2018-01-23: qty 10

## 2018-01-23 MED ORDER — SODIUM CHLORIDE 0.9 % IV SOLN
Freq: Once | INTRAVENOUS | Status: AC
  Administered 2018-01-23: 10:00:00 via INTRAVENOUS
  Filled 2018-01-23: qty 1000

## 2018-01-23 MED ORDER — PALONOSETRON HCL INJECTION 0.25 MG/5ML
0.2500 mg | Freq: Once | INTRAVENOUS | Status: AC
  Administered 2018-01-23: 0.25 mg via INTRAVENOUS
  Filled 2018-01-23: qty 5

## 2018-01-23 MED ORDER — SODIUM CHLORIDE 0.9 % IV SOLN: 10.0000 mg | Freq: Once | INTRAVENOUS | Status: DC

## 2018-01-23 MED ORDER — SODIUM CHLORIDE 0.9 % IV SOLN
130.0000 mg | Freq: Once | INTRAVENOUS | Status: AC
  Administered 2018-01-23: 130 mg via INTRAVENOUS
  Filled 2018-01-23: qty 13

## 2018-01-23 MED ORDER — SODIUM CHLORIDE 0.9 % IV SOLN
Freq: Once | INTRAVENOUS | Status: AC
Start: 2018-01-23 — End: 2018-01-23
  Administered 2018-01-23: 10:00:00 via INTRAVENOUS
  Filled 2018-01-23: qty 1000

## 2018-01-23 MED ORDER — LIDOCAINE VISCOUS 2 % MT SOLN: 20.0000 mL | OROMUCOSAL | 0 refills | Status: DC | PRN

## 2018-01-23 NOTE — Progress Notes (Signed)
MD d/c famotidine order in treatment plan. No famotidine to be given per MD>

## 2018-01-23 NOTE — Progress Notes (Signed)
Patient  Here for follow up complaints of sore throat due to radiation.

## 2018-01-24 ENCOUNTER — Ambulatory Visit
Admission: RE | Admit: 2018-01-24 | Discharge: 2018-01-24 | Disposition: A | Discharge status: Home / Self Care | Payer: BLUE CROSS/BLUE SHIELD | Source: Ambulatory Visit | Attending: Radiation Oncology | Admitting: Radiation Oncology

## 2018-01-24 ENCOUNTER — Ambulatory Visit: Payer: BLUE CROSS/BLUE SHIELD

## 2018-01-24 ENCOUNTER — Inpatient Hospital Stay: Payer: BLUE CROSS/BLUE SHIELD

## 2018-01-24 DIAGNOSIS — R221 Localized swelling, mass and lump, neck: Secondary | ICD-10-CM | POA: Diagnosis not present

## 2018-01-24 NOTE — Progress Notes (Signed)
Nutrition  Patient was no show for nutrition appointment today following radiation therapy.  Ivan Garcia, [place], [place] Registered Dietitian [phone] (pager)

## 2018-01-25 ENCOUNTER — Ambulatory Visit: Payer: BLUE CROSS/BLUE SHIELD

## 2018-01-25 ENCOUNTER — Ambulatory Visit
Admission: RE | Admit: 2018-01-25 | Discharge: 2018-01-25 | Disposition: A | Discharge status: Home / Self Care | Payer: BLUE CROSS/BLUE SHIELD | Source: Ambulatory Visit | Attending: Radiation Oncology | Admitting: Radiation Oncology

## 2018-01-25 DIAGNOSIS — R221 Localized swelling, mass and lump, neck: Secondary | ICD-10-CM | POA: Diagnosis not present

## 2018-01-28 ENCOUNTER — Ambulatory Visit: Payer: BLUE CROSS/BLUE SHIELD

## 2018-01-28 ENCOUNTER — Ambulatory Visit
Admission: RE | Admit: 2018-01-28 | Discharge: 2018-01-28 | Disposition: A | Discharge status: Home / Self Care | Payer: BLUE CROSS/BLUE SHIELD | Source: Ambulatory Visit | Attending: Radiation Oncology | Admitting: Radiation Oncology

## 2018-01-28 DIAGNOSIS — R221 Localized swelling, mass and lump, neck: Secondary | ICD-10-CM | POA: Diagnosis not present

## 2018-01-29 ENCOUNTER — Ambulatory Visit: Payer: BLUE CROSS/BLUE SHIELD

## 2018-01-29 ENCOUNTER — Ambulatory Visit
Admission: RE | Admit: 2018-01-29 | Discharge: 2018-01-29 | Disposition: A | Discharge status: Home / Self Care | Payer: BLUE CROSS/BLUE SHIELD | Source: Ambulatory Visit | Attending: Radiation Oncology | Admitting: Radiation Oncology

## 2018-01-29 DIAGNOSIS — R221 Localized swelling, mass and lump, neck: Secondary | ICD-10-CM | POA: Diagnosis not present

## 2018-01-29 NOTE — Progress Notes (Signed)
Hematology/Oncology  Follow Up note [facility] Telephone:(336) [phone] Fax:(336) [phone]   Patient Care Team: Albina Billet, MD as PCP - General (Internal Medicine) Albina Billet, MD (Internal Medicine) Bary Castilla Forest Gleason, MD (General Surgery)  REFERRING PROVIDER: Dr.McQueen CHIEF COMPLAINTS/PURPOSE OF CONSULTATION:  Evaluation of newly diagnosed head and neck cancer.  HISTORY OF PRESENTING ILLNESS:  Ivan Laventure. is a  62 y.o.  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 Tami Ribas. With ENT examination, he was found to have midline tongue base is to come down to 2 Weekly which is 40 mg pedunculated mass, 3cm.  And had CT soft tissue neck which confirmed pronounced lymphadenopathy in the right neck including conglomerate partially necrotic nodal mass spanning level III, 4 and 5, measuring 5.2 x 5.5 x 3.7 cm. Additional pathological nodes and right level 2 about 2.5 cm. There is asymmetry in the right tonsillar region. # Biopsy of the right neck mass and pathology revealed squamous cancer. P16 positive.  Current Cancer Treatment Cisplatin 100 mg/m2 every 3 weeks with concurrent RT which started on 12/31/2017.  S/p one dose of Cisplatin. Cisplatin discontinued due to nephrotoxicity.  Switch to weekly Carboplatin (AUC 2) / Taxol 10mmg/m2    INTERVAL HISTORY Ivan Garcia. is a 62 y.o. male who has above history reviewed by me today presents for follow up visit for management of squamous carcinoma of oropharynx. Currently on concurrent chemotherapy and radiation. Reports sore throat, and swallowing pain when eating solid food. Neck mass is decreasing in size.   Not drinking much fluid due to the throat discomfort  Review of Systems  Constitutional: Negative for chills, diaphoresis, fever and malaise/fatigue.  HENT: Positive for sore throat. Negative for congestion, ear discharge, ear  pain, hearing loss and tinnitus.        Sore throat  Eyes: Negative for pain, discharge and redness.  Respiratory: Negative for sputum production and shortness of breath.   Cardiovascular: Negative for chest pain, orthopnea and leg swelling.  Gastrointestinal: Negative for constipation, diarrhea, heartburn, melena, nausea and vomiting.  Genitourinary: Negative for dysuria, frequency, hematuria and urgency.  Musculoskeletal: Negative for myalgias.  Skin: Negative for rash.  Neurological: Negative for dizziness, tremors, sensory change and speech change.  Endo/Heme/Allergies: Negative for polydipsia. Does not bruise/bleed easily.  Psychiatric/Behavioral: Negative for depression, substance abuse and suicidal ideas. The patient is not nervous/anxious.     MEDICAL HISTORY:  Past Medical History:  Diagnosis Date  . Arthritis   . Cancer ([place])    Head and neck cancer  . Hemorrhoids   . Hyperlipidemia   . Hypertension     SURGICAL HISTORY: Past Surgical History:  Procedure Laterality Date  . COLONOSCOPY WITH PROPOFOL N/A 04/04/2017   Procedure: COLONOSCOPY WITH PROPOFOL;  Surgeon: Robert Bellow, MD;  Location: [facility] ENDOSCOPY;  Service: Endoscopy;  Laterality: N/A;  . MANDIBLE SURGERY    . PORTA CATH INSERTION N/A 12/19/2017   Procedure: PORTA CATH INSERTION;  Surgeon: Algernon Huxley, MD;  Location: [place] CV LAB;  Service: Cardiovascular;  Laterality: N/A;  . TONSILLECTOMY      SOCIAL HISTORY: Social History   Socioeconomic History  . Marital status: Single    Spouse name: Not on file  . Number of children: Not on file  . Years of education: Not on file  . Highest education level: Not on file  Social Needs  .  Financial resource strain: Not on file  . Food insecurity - worry: Not on file  . Food insecurity - inability: Not on file  . Transportation needs - medical: Not on file  . Transportation needs - non-medical: Not on file  Occupational History  . Not on file    Tobacco Use  . Smoking status: Never Smoker  . Smokeless tobacco: Never Used  Substance and Sexual Activity  . Alcohol use: No  . Drug use: No  . Sexual activity: Not on file  Other Topics Concern  . Not on file  Social History Narrative  . Not on file    FAMILY HISTORY: Family History  Problem Relation Age of Onset  . Breast cancer Mother   . Asthma Mother   . Congestive Heart Failure Mother   . Prostate cancer Father   . Brain cancer Father   . Bladder Cancer Father     ALLERGIES:  has No Known Allergies.  MEDICATIONS:  Current Outpatient Medications  Medication Sig Dispense Refill  . amLODipine (NORVASC) 2.5 MG tablet Take 1 tablet (2.5 mg total) by mouth daily. 30 tablet 0  . lansoprazole (PREVACID) 30 MG capsule Take 1 capsule (30 mg total) by mouth daily at 12 noon. 30 capsule 4  . lidocaine (XYLOCAINE) 2 % solution Use as directed 20 mLs in the mouth or throat every 4 (four) hours as needed for mouth pain (throat pain). 300 mL 0  . magic mouthwash SOLN Take 5 mLs by mouth 3 (three) times daily. 100 mL 0  . Multiple Vitamins-Minerals (MULTIVITAMIN WITH MINERALS) tablet Take 1 tablet by mouth daily.    Marland Kitchen oxyCODONE-acetaminophen (PERCOCET/ROXICET) 5-325 MG tablet Take 1 tablet by mouth every 6 (six) hours as needed for severe pain. 56 tablet 0  . rosuvastatin (CRESTOR) 10 MG tablet Take 10 mg by mouth every evening.  4  . sucralfate (CARAFATE) 1 g tablet Take 1 tablet (1 g total) by mouth 3 (three) times daily before meals. 90 tablet 5   No current facility-administered medications for this visit.      PHYSICAL EXAMINATION: ECOG PERFORMANCE STATUS: 1 - Symptomatic but completely ambulatory Vitals:   01/30/18 0839  BP: (!) 137/93  Pulse: 98  Resp: 18  Temp: (!) 97.4 F (36.3 C)  SpO2: 97%   Filed Weights   01/30/18 0839  Weight: 204 lb 1.6 oz (92.6 kg)    Physical Exam  Constitutional: He is oriented to person, place, and time. No distress.  HENT:   Head: Normocephalic and atraumatic.  Mouth/Throat: Oropharynx is clear and moist. No oropharyngeal exudate.  Eyes: Conjunctivae and EOM are normal. Pupils are equal, round, and reactive to light. Left eye exhibits no discharge. No scleral icterus.  Neck: Normal range of motion. Neck supple. No JVD present.  Right side neck mas decrease in size.. Tongue base mass.  Cardiovascular: Normal rate and regular rhythm. Exam reveals no friction rub.  No murmur heard. Pulmonary/Chest: Breath sounds normal. He has no wheezes. He has no rales.  Abdominal: Soft. Bowel sounds are normal. He exhibits no distension. There is no tenderness. There is no rebound.  Musculoskeletal: Normal range of motion. He exhibits no edema or tenderness.  Lymphadenopathy:    He has cervical adenopathy.  Neurological: He is alert and oriented to person, place, and time. No cranial nerve deficit. Gait normal.  Skin: Skin is dry. No rash noted. He is not diaphoretic. No erythema.  Psychiatric: Memory and affect normal.  LABORATORY DATA:  I have reviewed the data as listed Lab Results  Component Value Date   WBC 5.4 01/23/2018   HGB 12.5 (L) 01/23/2018   HCT 36.3 (L) 01/23/2018   MCV 87.7 01/23/2018   PLT 190 01/23/2018   Recent Labs    01/02/18 0945  01/08/18 0947 01/15/18 0848 01/23/18 0832  NA 133*   < > 141 135 138  K 4.7   < > 3.9 4.3 4.2  CL 100*   < > 104 102 106  CO2 19*   < > 28 24 26   GLUCOSE 143*   < > 101* 118* 113*  BUN 98*   < > 37* 41* 39*  CREATININE 7.44*   < > 2.50* 2.48* 2.42*  CALCIUM 8.5*   < > 9.4 9.2 9.3  GFRNONAA 7*   < > 26* 26* 27*  GFRAA 8*   < > 30* 30* 31*  PROT 6.7  --   --  7.5 7.2  ALBUMIN 3.4*  --   --  3.8 3.7  AST 22  --   --  20 19  ALT 30  --   --  19 16*  ALKPHOS 72  --   --  92 88  BILITOT 0.7  --   --  0.8 0.6   < > = values in this interval not displayed.     RADIOGRAPHIC STUDIES: I have personally reviewed the radiological images as listed and agreed  with the findings in the report. PET scan 12/13/2017 1. Primary hypermetabolic mucosal lesion in the left oropharynx/tongue base with right-sided bulky multilevel lymphadenopathy. 2. No findings for metastatic disease involving the chest, abdomen, pelvis or bony structures.  ASSESSMENT & PLAN:  Clinically he has cT2 cN2 disease, stage II.  1. 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[place])   2. Encounter for antineoplastic chemotherapy   3. Odynophagia   Proceed with cycle 3 Carbo + taxol today.  # IV NS 1 L x1 # Odynophagia, due to RT. Continue magic mouth wash PRN.  Will arrange him to get hydration session on 02/01/2018 and 02/04/2018  Continue follow up with RadOnc for RT.   All questions were answered. The patient knows to call the clinic with any problems questions or concerns.  Return of visit: 1 week with labs and evaluation prior to weekly Carboplatin and taxol.       Earlie Server, MD, PhD Hematology Oncology [facility] Pager- [phone] 01/30/2018

## 2018-01-30 ENCOUNTER — Inpatient Hospital Stay: Payer: BLUE CROSS/BLUE SHIELD

## 2018-01-30 ENCOUNTER — Ambulatory Visit
Admission: RE | Admit: 2018-01-30 | Discharge: 2018-01-30 | Disposition: A | Discharge status: Home / Self Care | Payer: BLUE CROSS/BLUE SHIELD | Source: Ambulatory Visit | Attending: Radiation Oncology | Admitting: Radiation Oncology

## 2018-01-30 ENCOUNTER — Encounter: Payer: Self-pay | Admitting: Oncology

## 2018-01-30 ENCOUNTER — Ambulatory Visit: Payer: BLUE CROSS/BLUE SHIELD

## 2018-01-30 ENCOUNTER — Inpatient Hospital Stay (HOSPITAL_BASED_OUTPATIENT_CLINIC_OR_DEPARTMENT_OTHER): Payer: BLUE CROSS/BLUE SHIELD | Admitting: Oncology

## 2018-01-30 VITALS — BP 137/93 | HR 98 | Temp 97.4°F | Resp 18 | Ht 70.0 in | Wt 204.1 lb

## 2018-01-30 DIAGNOSIS — Z5111 Encounter for antineoplastic chemotherapy: Secondary | ICD-10-CM

## 2018-01-30 DIAGNOSIS — C109 Malignant neoplasm of oropharynx, unspecified: Secondary | ICD-10-CM

## 2018-01-30 DIAGNOSIS — I10 Essential (primary) hypertension: Secondary | ICD-10-CM

## 2018-01-30 DIAGNOSIS — R1310 Dysphagia, unspecified: Secondary | ICD-10-CM | POA: Diagnosis not present

## 2018-01-30 DIAGNOSIS — R070 Pain in throat: Secondary | ICD-10-CM

## 2018-01-30 DIAGNOSIS — R221 Localized swelling, mass and lump, neck: Secondary | ICD-10-CM | POA: Diagnosis not present

## 2018-01-30 LAB — COMPREHENSIVE METABOLIC PANEL
ALK PHOS: 82 U/L (ref 38–126)
ALT: 15 U/L — AB (ref 17–63)
AST: 17 U/L (ref 15–41)
Albumin: 3.4 g/dL — ABNORMAL LOW (ref 3.5–5.0)
Anion gap: 7 (ref 5–15)
BUN: 26 mg/dL — ABNORMAL HIGH (ref 6–20)
CALCIUM: 9.2 mg/dL (ref 8.9–10.3)
CO2: 25 mmol/L (ref 22–32)
CREATININE: 1.96 mg/dL — AB (ref 0.61–1.24)
Chloride: 103 mmol/L (ref 101–111)
GFR, EST AFRICAN AMERICAN: 40 mL/min — AB (ref >60)
GFR, EST NON AFRICAN AMERICAN: 35 mL/min — AB (ref >60)
Glucose, Bld: 117 mg/dL — ABNORMAL HIGH (ref 65–99)
Potassium: 4.1 mmol/L (ref 3.5–5.1)
Sodium: 135 mmol/L (ref 135–145)
TOTAL PROTEIN: 7.2 g/dL (ref 6.5–8.1)
Total Bilirubin: 0.7 mg/dL (ref 0.3–1.2)

## 2018-01-30 LAB — CBC WITH DIFFERENTIAL/PLATELET
BASOS PCT: 1 %
Basophils Absolute: 0 10*3/uL (ref 0–0.1)
EOS ABS: 0.1 10*3/uL (ref 0–0.7)
Eosinophils Relative: 2 %
HCT: 33.4 % — ABNORMAL LOW (ref 40.0–52.0)
HEMOGLOBIN: 11.8 g/dL — AB (ref 13.0–18.0)
Lymphocytes Relative: 6 %
Lymphs Abs: 0.2 10*3/uL — ABNORMAL LOW (ref 1.0–3.6)
MCH: 30.9 pg (ref 26.0–34.0)
MCHC: 35.3 g/dL (ref 32.0–36.0)
MCV: 87.5 fL (ref 80.0–100.0)
Monocytes Absolute: 0.4 10*3/uL (ref 0.2–1.0)
Monocytes Relative: 9 %
NEUTROS PCT: 82 %
Neutro Abs: 3.6 10*3/uL (ref 1.4–6.5)
Platelets: 232 10*3/uL (ref 150–440)
RBC: 3.82 MIL/uL — AB (ref 4.40–5.90)
RDW: 13.2 % (ref 11.5–14.5)
WBC: 4.4 10*3/uL (ref 3.8–10.6)

## 2018-01-30 MED ORDER — DEXAMETHASONE SODIUM PHOSPHATE 10 MG/ML IJ SOLN
10.0000 mg | Freq: Once | INTRAMUSCULAR | Status: AC
  Administered 2018-01-30: 10 mg via INTRAVENOUS
  Filled 2018-01-30: qty 1

## 2018-01-30 MED ORDER — SODIUM CHLORIDE 0.9 % IV SOLN
Freq: Once | INTRAVENOUS | Status: AC
  Filled 2018-01-30: qty 1000

## 2018-01-30 MED ORDER — DIPHENHYDRAMINE HCL 50 MG/ML IJ SOLN
50.0000 mg | Freq: Once | INTRAMUSCULAR | Status: AC
  Administered 2018-01-30: 50 mg via INTRAVENOUS
  Filled 2018-01-30: qty 1

## 2018-01-30 MED ORDER — SODIUM CHLORIDE 0.9 % IV SOLN
45.0000 mg/m2 | Freq: Once | INTRAVENOUS | Status: AC
  Administered 2018-01-30: 96 mg via INTRAVENOUS
  Filled 2018-01-30: qty 16

## 2018-01-30 MED ORDER — PALONOSETRON HCL INJECTION 0.25 MG/5ML
0.2500 mg | Freq: Once | INTRAVENOUS | Status: AC
  Administered 2018-01-30: 0.25 mg via INTRAVENOUS
  Filled 2018-01-30: qty 5

## 2018-01-30 MED ORDER — SODIUM CHLORIDE 0.9 % IV SOLN
Freq: Once | INTRAVENOUS | Status: AC
  Administered 2018-01-30: 10:00:00 via INTRAVENOUS
  Filled 2018-01-30: qty 1000

## 2018-01-30 MED ORDER — CARBOPLATIN CHEMO INJECTION 450 MG/45ML
160.0000 mg | Freq: Once | INTRAVENOUS | Status: AC
  Administered 2018-01-30: 160 mg via INTRAVENOUS
  Filled 2018-01-30: qty 16

## 2018-01-30 MED ORDER — HEPARIN SOD (PORK) LOCK FLUSH 100 UNIT/ML IV SOLN
500.0000 [IU] | Freq: Once | INTRAVENOUS | Status: AC | PRN
  Administered 2018-01-30: 500 [IU]
  Filled 2018-01-30: qty 5

## 2018-01-30 NOTE — Progress Notes (Signed)
Throat sorness x 3 days

## 2018-01-31 ENCOUNTER — Ambulatory Visit: Payer: BLUE CROSS/BLUE SHIELD

## 2018-01-31 ENCOUNTER — Inpatient Hospital Stay: Payer: BLUE CROSS/BLUE SHIELD

## 2018-01-31 ENCOUNTER — Ambulatory Visit
Admission: RE | Admit: 2018-01-31 | Discharge: 2018-01-31 | Disposition: A | Discharge status: Home / Self Care | Payer: BLUE CROSS/BLUE SHIELD | Source: Ambulatory Visit | Attending: Radiation Oncology | Admitting: Radiation Oncology

## 2018-01-31 DIAGNOSIS — R221 Localized swelling, mass and lump, neck: Secondary | ICD-10-CM | POA: Diagnosis not present

## 2018-01-31 NOTE — Progress Notes (Signed)
Nutrition Follow-up:  Patient with oropharynx cancer, HPV positive.  Patient followed by Dr. Tasia Catchings.  Patient receiving radiation therapy and chemotherapy (carbo/taxol).    Met with patient following radiation therapy treatment today.  Patient reports sore throat for the past 3 days which has effected intake.  Reports no taste and poor appetite.  Also reports some dry mouth.  Has been using carfate which helps.  Not using lidocaine or magic mouthwash.  Reports that he has been eating oatmeal or grits, mainly soups.  Reports makes blender full shake of fruit, whole milk, peanut butter, protein powder and drinks shake during the day.  Drinking 1 ensure plus per day.    Reports thick saliva as well.   Noted patient will be getting IV fluids two times per week next week.    Medications: reviewed, rinsing with baking soda, salt and water  Labs: reviewed  Anthropometrics:   Weight has decreased to 204 lb 1.6 oz noted on 2/27 from 210 lb on 2/14.  UBW 220s  8% weight loss in the last 2 months, significant.  Patient reports some of this weight loss can be associated with him choosing lower calorie foods at initial diagnosis.     NUTRITION DIAGNOSIS: Predicted suboptimal intake continues with continuing treatment   INTERVENTION:   Discussed importance of increasing calories and protein at this time as weight continues to decrease as treatment continues.   Recommend patient increase shakes to 3 times per day. Discussed high calorie and high protein foods for patient to choose that are softer due to sore throat.   Also discussed strategies to help with dry mouth and thick saliva. Handout given to patient.    MONITORING, EVALUATION, GOAL: Patient will consume adequate calories and protein to meet nutritional needs and maintain weight during treatment.     NEXT VISIT: phone follow-up  Shraga Custard [name], [place], [place] Registered Dietitian [phone] (pager)

## 2018-02-01 ENCOUNTER — Inpatient Hospital Stay: Payer: BLUE CROSS/BLUE SHIELD | Attending: Oncology

## 2018-02-01 ENCOUNTER — Ambulatory Visit
Admission: RE | Admit: 2018-02-01 | Discharge: 2018-02-01 | Disposition: A | Discharge status: Home / Self Care | Payer: BLUE CROSS/BLUE SHIELD | Source: Ambulatory Visit | Attending: Radiation Oncology | Admitting: Radiation Oncology

## 2018-02-01 ENCOUNTER — Ambulatory Visit: Payer: BLUE CROSS/BLUE SHIELD

## 2018-02-01 VITALS — BP 128/77 | HR 89 | Resp 18

## 2018-02-01 DIAGNOSIS — Z5111 Encounter for antineoplastic chemotherapy: Secondary | ICD-10-CM | POA: Diagnosis present

## 2018-02-01 DIAGNOSIS — R000 Tachycardia, unspecified: Secondary | ICD-10-CM | POA: Diagnosis not present

## 2018-02-01 DIAGNOSIS — R1319 Other dysphagia: Secondary | ICD-10-CM | POA: Insufficient documentation

## 2018-02-01 DIAGNOSIS — I10 Essential (primary) hypertension: Secondary | ICD-10-CM | POA: Diagnosis not present

## 2018-02-01 DIAGNOSIS — L598 Other specified disorders of the skin and subcutaneous tissue related to radiation: Secondary | ICD-10-CM | POA: Diagnosis not present

## 2018-02-01 DIAGNOSIS — C109 Malignant neoplasm of oropharynx, unspecified: Secondary | ICD-10-CM | POA: Insufficient documentation

## 2018-02-01 DIAGNOSIS — Z510 Encounter for antineoplastic radiation therapy: Secondary | ICD-10-CM | POA: Diagnosis present

## 2018-02-01 DIAGNOSIS — E785 Hyperlipidemia, unspecified: Secondary | ICD-10-CM | POA: Insufficient documentation

## 2018-02-01 DIAGNOSIS — K649 Unspecified hemorrhoids: Secondary | ICD-10-CM | POA: Diagnosis not present

## 2018-02-01 DIAGNOSIS — C760 Malignant neoplasm of head, face and neck: Secondary | ICD-10-CM | POA: Insufficient documentation

## 2018-02-01 DIAGNOSIS — Z923 Personal history of irradiation: Secondary | ICD-10-CM | POA: Diagnosis not present

## 2018-02-01 DIAGNOSIS — B370 Candidal stomatitis: Secondary | ICD-10-CM | POA: Insufficient documentation

## 2018-02-01 MED ORDER — HEPARIN SOD (PORK) LOCK FLUSH 100 UNIT/ML IV SOLN
500.0000 [IU] | Freq: Once | INTRAVENOUS | Status: AC
  Administered 2018-02-01: 500 [IU] via INTRAVENOUS

## 2018-02-01 MED ORDER — SODIUM CHLORIDE 0.9% FLUSH
10.0000 mL | INTRAVENOUS | Status: DC | PRN
  Administered 2018-02-01: 10 mL via INTRAVENOUS
  Filled 2018-02-01: qty 10

## 2018-02-01 MED ORDER — SODIUM CHLORIDE 0.9 % IV SOLN
Freq: Once | INTRAVENOUS | Status: AC
  Administered 2018-02-01: 10:00:00 via INTRAVENOUS
  Filled 2018-02-01: qty 1000

## 2018-02-04 ENCOUNTER — Inpatient Hospital Stay: Payer: BLUE CROSS/BLUE SHIELD

## 2018-02-04 ENCOUNTER — Ambulatory Visit: Admission: RE | Admit: 2018-02-04 | Payer: BLUE CROSS/BLUE SHIELD | Source: Ambulatory Visit

## 2018-02-04 ENCOUNTER — Encounter: Payer: Self-pay | Admitting: Oncology

## 2018-02-04 ENCOUNTER — Inpatient Hospital Stay (HOSPITAL_BASED_OUTPATIENT_CLINIC_OR_DEPARTMENT_OTHER): Payer: BLUE CROSS/BLUE SHIELD | Admitting: Oncology

## 2018-02-04 ENCOUNTER — Ambulatory Visit: Payer: BLUE CROSS/BLUE SHIELD

## 2018-02-04 ENCOUNTER — Other Ambulatory Visit: Payer: Self-pay | Admitting: *Deleted

## 2018-02-04 VITALS — BP 130/89 | HR 90 | Resp 18

## 2018-02-04 DIAGNOSIS — R1310 Dysphagia, unspecified: Secondary | ICD-10-CM | POA: Diagnosis not present

## 2018-02-04 DIAGNOSIS — C109 Malignant neoplasm of oropharynx, unspecified: Secondary | ICD-10-CM

## 2018-02-04 DIAGNOSIS — B370 Candidal stomatitis: Secondary | ICD-10-CM | POA: Diagnosis not present

## 2018-02-04 LAB — COMPREHENSIVE METABOLIC PANEL
ALT: 13 U/L — ABNORMAL LOW (ref 17–63)
AST: 14 U/L — ABNORMAL LOW (ref 15–41)
Albumin: 3.3 g/dL — ABNORMAL LOW (ref 3.5–5.0)
Alkaline Phosphatase: 71 U/L (ref 38–126)
Anion gap: 8 (ref 5–15)
BUN: 31 mg/dL — ABNORMAL HIGH (ref 6–20)
CHLORIDE: 105 mmol/L (ref 101–111)
CO2: 25 mmol/L (ref 22–32)
Calcium: 9.1 mg/dL (ref 8.9–10.3)
Creatinine, Ser: 1.74 mg/dL — ABNORMAL HIGH (ref 0.61–1.24)
GFR, EST AFRICAN AMERICAN: 47 mL/min — AB (ref >60)
GFR, EST NON AFRICAN AMERICAN: 40 mL/min — AB (ref >60)
Glucose, Bld: 123 mg/dL — ABNORMAL HIGH (ref 65–99)
POTASSIUM: 4.2 mmol/L (ref 3.5–5.1)
Sodium: 138 mmol/L (ref 135–145)
Total Bilirubin: 0.6 mg/dL (ref 0.3–1.2)
Total Protein: 7.2 g/dL (ref 6.5–8.1)

## 2018-02-04 MED ORDER — SODIUM CHLORIDE 0.9 % IV SOLN
Freq: Once | INTRAVENOUS | Status: AC
  Administered 2018-02-04: 10:00:00 via INTRAVENOUS
  Filled 2018-02-04: qty 1000

## 2018-02-04 MED ORDER — FLUCONAZOLE IN SODIUM CHLORIDE 400-0.9 MG/200ML-% IV SOLN: 100.0000 mg | INTRAVENOUS | Status: DC

## 2018-02-04 MED ORDER — DEXAMETHASONE 4 MG PO TABS: 4.0000 mg | ORAL_TABLET | Freq: Two times a day (BID) | ORAL | 0 refills | Status: DC

## 2018-02-04 MED ORDER — HEPARIN SOD (PORK) LOCK FLUSH 100 UNIT/ML IV SOLN
500.0000 [IU] | Freq: Once | INTRAVENOUS | Status: AC
  Administered 2018-02-04: 500 [IU] via INTRAVENOUS
  Filled 2018-02-04: qty 5

## 2018-02-04 MED ORDER — FLUCONAZOLE IN SODIUM CHLORIDE 400-0.9 MG/200ML-% IV SOLN
100.0000 mg | Freq: Once | INTRAVENOUS | Status: DC
  Filled 2018-02-04: qty 200

## 2018-02-04 MED ORDER — ONDANSETRON HCL 4 MG/2ML IJ SOLN
8.0000 mg | Freq: Once | INTRAMUSCULAR | Status: AC
  Administered 2018-02-04: 8 mg via INTRAVENOUS
  Filled 2018-02-04: qty 4

## 2018-02-04 MED ORDER — SODIUM CHLORIDE 0.9 % IV SOLN: Freq: Once | INTRAVENOUS | Status: DC

## 2018-02-04 MED ORDER — SODIUM CHLORIDE 0.9% FLUSH
10.0000 mL | Freq: Once | INTRAVENOUS | Status: AC
  Administered 2018-02-04: 10 mL via INTRAVENOUS
  Filled 2018-02-04: qty 10

## 2018-02-04 MED ORDER — FLUCONAZOLE IN SODIUM CHLORIDE 100-0.9 MG/50ML-% IV SOLN
100.0000 mg | Freq: Once | INTRAVENOUS | Status: AC
  Administered 2018-02-04: 100 mg via INTRAVENOUS
  Filled 2018-02-04: qty 50

## 2018-02-04 NOTE — Progress Notes (Signed)
Hematology/Oncology  Follow Up note [facility] Telephone:(336) [phone] Fax:(336) [phone]   Patient Care Team: Albina Billet, MD as PCP - General (Internal Medicine) Albina Billet, MD (Internal Medicine) Bary Castilla Forest Gleason, MD (General Surgery)  REFERRING PROVIDER: Dr.McQueen CHIEF COMPLAINTS/PURPOSE OF CONSULTATION:  Evaluation of newly diagnosed head and neck cancer.  HISTORY OF PRESENTING ILLNESS:  Ivan Babington. is a  62 y.o.  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 Tami Ribas. With ENT examination, he was found to have midline tongue base is to come down to 2 Weekly which is 40 mg pedunculated mass, 3cm.  And had CT soft tissue neck which confirmed pronounced lymphadenopathy in the right neck including conglomerate partially necrotic nodal mass spanning level III, 4 and 5, measuring 5.2 x 5.5 x 3.7 cm. Additional pathological nodes and right level 2 about 2.5 cm. There is asymmetry in the right tonsillar region. # Biopsy of the right neck mass and pathology revealed squamous cancer. P16 positive.  Current Cancer Treatment Cisplatin 100 mg/m2 every 3 weeks with concurrent RT which started on 12/31/2017.  S/p one dose of Cisplatin. Cisplatin discontinued due to nephrotoxicity.  Switch to weekly Carboplatin (AUC 2) / Taxol 39mmg/m2    INTERVAL HISTORY Ivan Garcia. is a 62 y.o. male who has above history reviewed by me today presents for hydration session and reports not feeling well. Added followed up encounter for evaluation.  He reports worsening of sore throat, swallowing pain, poor oral intake. He is only able to drink 2-3 Ensure shake per day but not able to swallow any solid food.  Review of Systems  Constitutional: Negative for chills, diaphoresis, fever and malaise/fatigue.  HENT: Positive for sore throat. Negative for congestion, ear discharge, ear pain, hearing loss  and tinnitus.        Sore throat  Eyes: Negative for photophobia, pain, discharge and redness.  Respiratory: Negative for cough, sputum production and shortness of breath.   Cardiovascular: Negative for chest pain, orthopnea and leg swelling.  Gastrointestinal: Negative for constipation, diarrhea, heartburn, melena, nausea and vomiting.  Genitourinary: Negative for dysuria, frequency, hematuria and urgency.  Musculoskeletal: Negative for myalgias and neck pain.  Skin: Negative for rash.  Neurological: Negative for dizziness, tremors and sensory change.  Endo/Heme/Allergies: Negative for polydipsia. Does not bruise/bleed easily.  Psychiatric/Behavioral: Negative for depression, substance abuse and suicidal ideas. The patient is not nervous/anxious.     MEDICAL HISTORY:  Past Medical History:  Diagnosis Date  . Arthritis   . Cancer ([place])    Head and neck cancer  . Hemorrhoids   . Hyperlipidemia   . Hypertension     SURGICAL HISTORY: Past Surgical History:  Procedure Laterality Date  . COLONOSCOPY WITH PROPOFOL N/A 04/04/2017   Procedure: COLONOSCOPY WITH PROPOFOL;  Surgeon: Robert Bellow, MD;  Location: [facility] ENDOSCOPY;  Service: Endoscopy;  Laterality: N/A;  . MANDIBLE SURGERY    . PORTA CATH INSERTION N/A 12/19/2017   Procedure: PORTA CATH INSERTION;  Surgeon: Algernon Huxley, MD;  Location: [facility] LAB;  Service: Cardiovascular;  Laterality: N/A;  . TONSILLECTOMY      SOCIAL HISTORY: Social History   Socioeconomic History  . Marital status: Single    Spouse name: Not on file  . Number of children: Not on file  . Years of education: Not on file  . Highest education level: Not on file  Social Needs  . Financial resource strain: Not on file  . Food insecurity - worry: Not on file  . Food insecurity - inability: Not on file  . Transportation needs - medical: Not on file  . Transportation needs - non-medical: Not on file  Occupational History  . Not on file    Tobacco Use  . Smoking status: Never Smoker  . Smokeless tobacco: Never Used  Substance and Sexual Activity  . Alcohol use: No  . Drug use: No  . Sexual activity: Not on file  Other Topics Concern  . Not on file  Social History Narrative  . Not on file    FAMILY HISTORY: Family History  Problem Relation Age of Onset  . Breast cancer Mother   . Asthma Mother   . Congestive Heart Failure Mother   . Prostate cancer Father   . Brain cancer Father   . Bladder Cancer Father     ALLERGIES:  has No Known Allergies.  MEDICATIONS:  Current Outpatient Medications  Medication Sig Dispense Refill  . amLODipine (NORVASC) 2.5 MG tablet Take 1 tablet (2.5 mg total) by mouth daily. 30 tablet 0  . dexamethasone (DECADRON) 4 MG tablet Take 1 tablet (4 mg total) by mouth 2 (two) times daily with a meal. 30 tablet 0  . lansoprazole (PREVACID) 30 MG capsule Take 1 capsule (30 mg total) by mouth daily at 12 noon. 30 capsule 4  . lidocaine (XYLOCAINE) 2 % solution Use as directed 20 mLs in the mouth or throat every 4 (four) hours as needed for mouth pain (throat pain). 300 mL 0  . magic mouthwash SOLN Take 5 mLs by mouth 3 (three) times daily. (Patient not taking: Reported on 01/30/2018) 100 mL 0  . Multiple Vitamins-Minerals (MULTIVITAMIN WITH MINERALS) tablet Take 1 tablet by mouth daily.    Marland Kitchen oxyCODONE-acetaminophen (PERCOCET/ROXICET) 5-325 MG tablet Take 1 tablet by mouth every 6 (six) hours as needed for severe pain. (Patient not taking: Reported on 01/30/2018) 56 tablet 0  . rosuvastatin (CRESTOR) 10 MG tablet Take 10 mg by mouth every evening.  4  . sucralfate (CARAFATE) 1 g tablet Take 1 tablet (1 g total) by mouth 3 (three) times daily before meals. 90 tablet 5   No current facility-administered medications for this visit.      PHYSICAL EXAMINATION: ECOG PERFORMANCE STATUS: 1 - Symptomatic but completely ambulatory There were no vitals filed for this visit. There were no vitals filed  for this visit.  Physical Exam  Constitutional: He is oriented to person, place, and time. No distress.  HENT:  Head: Normocephalic and atraumatic.  Mouth/Throat: Oropharynx is clear and moist. No oropharyngeal exudate.  thrush  Eyes: Conjunctivae and EOM are normal. Pupils are equal, round, and reactive to light. No scleral icterus.  Neck: Normal range of motion. Neck supple. No JVD present.  Right side neck mas decrease in size.. Tongue base mass.  Cardiovascular: Normal rate, regular rhythm and normal heart sounds. Exam reveals no friction rub.  No murmur heard. Pulmonary/Chest: Breath sounds normal. He has no wheezes. He has no rales.  Abdominal: Soft. Bowel sounds are normal. He exhibits no distension. There is no tenderness. There is no rebound.  Musculoskeletal: Normal range of motion. He exhibits no edema or tenderness.  Lymphadenopathy:    He has cervical adenopathy.  Neurological: He is alert and oriented to person, place, and time. No cranial nerve deficit. Gait normal.  Skin: Skin is dry. No rash noted. He  is not diaphoretic. No erythema.  Psychiatric: Memory, affect and judgment normal.     LABORATORY DATA:  I have reviewed the data as listed Lab Results  Component Value Date   WBC 4.4 01/30/2018   HGB 11.8 (L) 01/30/2018   HCT 33.4 (L) 01/30/2018   MCV 87.5 01/30/2018   PLT 232 01/30/2018   Recent Labs    01/23/18 0832 01/30/18 0821 02/04/18 1050  NA 138 135 138  K 4.2 4.1 4.2  CL 106 103 105  CO2 26 25 25   GLUCOSE 113* 117* 123*  BUN 39* 26* 31*  CREATININE 2.42* 1.96* 1.74*  CALCIUM 9.3 9.2 9.1  GFRNONAA 27* 35* 40*  GFRAA 31* 40* 47*  PROT 7.2 7.2 7.2  ALBUMIN 3.7 3.4* 3.3*  AST 19 17 14*  ALT 16* 15* 13*  ALKPHOS 88 82 71  BILITOT 0.6 0.7 0.6     RADIOGRAPHIC STUDIES: I have personally reviewed the radiological images as listed and agreed with the findings in the report. PET scan 12/13/2017 1. Primary hypermetabolic mucosal lesion in the  left oropharynx/tongue base with right-sided bulky multilevel lymphadenopathy. 2. No findings for metastatic disease involving the chest, abdomen, pelvis or bony structures.  ASSESSMENT & PLAN:  Clinically he has cT2 cN2 disease, stage II.  1. Thrush   2. Odynophagia   3. 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[place])    # IV NS 1 L x1 # Odynophagia, due to RT. Rx of magic mouth wash was provided, however [facility].  And [facility] to [facility] it.  # Start diflucan 100mg  IV daily. Dose adjusted to his kidney function.   Will arrange him to get hydration session on 02/05/2018 and 02/06/2018  Continue follow up with RadOnc for RT.   All questions were answered. The patient knows to call the clinic with any problems questions or concerns.  Return of visit: 2 days with labs and evaluation prior to weekly Carboplatin and taxol.       Earlie Server, MD, PhD Hematology Oncology [facility] Pager- [phone] 02/04/2018

## 2018-02-04 NOTE — Progress Notes (Signed)
Nutrition Follow-up:  Patient seen in infusion per patient request.  Patient in infusion for IV fluids.  Patient reports that appetite is decreased and has not been eating well due to pain in mouth and on swallowing.  Seen by Dr. Tasia Catchings and noted to have thrush per patient.  Patient reports tried to eat some chicken soup and hard to eat due to pain.  Noted thick saliva as well in mouth.  Patient reports trying to drink 3 ensure plus per day and homemade shake.   Reports radiation therapy will be on hold until Thursday and will be coming in daily for fluids.      Medications: reviewed  Labs: reviewed  Anthropometrics:   No new weight recorded per chart.  Patient feels that he is loosing weight    NUTRITION DIAGNOSIS: Predicted suboptimal intake continues with continuing treatment    INTERVENTION:   Encouraged patient to try grinding, pureeing foods with blender.   Recommend drinking 5-6 ensure plus per day during interim to better meet calorie and protein goals with limited intake of solid foods    MONITORING, EVALUATION, GOAL: Patient will consume adequate calories and protein to meet nutritional needs and maintain weight during treatment   NEXT VISIT: March 7 during fluids  Ivan Garcia [name], [place], [place] Registered Dietitian [phone] (pager)

## 2018-02-05 ENCOUNTER — Ambulatory Visit: Payer: BLUE CROSS/BLUE SHIELD

## 2018-02-05 ENCOUNTER — Other Ambulatory Visit: Payer: Self-pay

## 2018-02-05 ENCOUNTER — Inpatient Hospital Stay: Payer: BLUE CROSS/BLUE SHIELD

## 2018-02-05 VITALS — BP 131/85 | HR 116 | Temp 97.1°F | Resp 18

## 2018-02-05 DIAGNOSIS — C109 Malignant neoplasm of oropharynx, unspecified: Secondary | ICD-10-CM | POA: Diagnosis not present

## 2018-02-05 MED ORDER — FLUCONAZOLE 100MG IVPB
100.0000 mg | Freq: Once | INTRAVENOUS | Status: AC
  Administered 2018-02-05: 100 mg via INTRAVENOUS
  Filled 2018-02-05: qty 50

## 2018-02-05 MED ORDER — HEPARIN SOD (PORK) LOCK FLUSH 100 UNIT/ML IV SOLN
500.0000 [IU] | Freq: Once | INTRAVENOUS | Status: AC
  Administered 2018-02-05: 500 [IU] via INTRAVENOUS
  Filled 2018-02-05: qty 5

## 2018-02-05 MED ORDER — SODIUM CHLORIDE 0.9 % IV SOLN
Freq: Once | INTRAVENOUS | Status: AC
  Administered 2018-02-05: 09:00:00 via INTRAVENOUS
  Filled 2018-02-05: qty 1000

## 2018-02-05 MED ORDER — FLUCONAZOLE IN SODIUM CHLORIDE 400-0.9 MG/200ML-% IV SOLN: 100.0000 mg | INTRAVENOUS | Status: DC

## 2018-02-06 ENCOUNTER — Inpatient Hospital Stay: Payer: BLUE CROSS/BLUE SHIELD

## 2018-02-06 ENCOUNTER — Encounter: Payer: Self-pay | Admitting: Oncology

## 2018-02-06 ENCOUNTER — Ambulatory Visit: Payer: BLUE CROSS/BLUE SHIELD

## 2018-02-06 ENCOUNTER — Other Ambulatory Visit: Payer: Self-pay

## 2018-02-06 ENCOUNTER — Inpatient Hospital Stay (HOSPITAL_BASED_OUTPATIENT_CLINIC_OR_DEPARTMENT_OTHER): Payer: BLUE CROSS/BLUE SHIELD | Admitting: Oncology

## 2018-02-06 VITALS — BP 139/79 | HR 98 | Temp 97.5°F | Resp 16 | Ht 70.0 in | Wt 203.4 lb

## 2018-02-06 DIAGNOSIS — R1310 Dysphagia, unspecified: Secondary | ICD-10-CM | POA: Diagnosis not present

## 2018-02-06 DIAGNOSIS — C109 Malignant neoplasm of oropharynx, unspecified: Secondary | ICD-10-CM

## 2018-02-06 DIAGNOSIS — Z5111 Encounter for antineoplastic chemotherapy: Secondary | ICD-10-CM

## 2018-02-06 DIAGNOSIS — B370 Candidal stomatitis: Secondary | ICD-10-CM

## 2018-02-06 LAB — COMPREHENSIVE METABOLIC PANEL
ALK PHOS: 64 U/L (ref 38–126)
ALT: 16 U/L — AB (ref 17–63)
ANION GAP: 9 (ref 5–15)
AST: 21 U/L (ref 15–41)
Albumin: 3.3 g/dL — ABNORMAL LOW (ref 3.5–5.0)
BUN: 41 mg/dL — ABNORMAL HIGH (ref 6–20)
CALCIUM: 9.3 mg/dL (ref 8.9–10.3)
CO2: 22 mmol/L (ref 22–32)
Chloride: 106 mmol/L (ref 101–111)
Creatinine, Ser: 1.83 mg/dL — ABNORMAL HIGH (ref 0.61–1.24)
GFR calc Af Amer: 44 mL/min — ABNORMAL LOW (ref >60)
GFR calc non Af Amer: 38 mL/min — ABNORMAL LOW (ref >60)
Glucose, Bld: 121 mg/dL — ABNORMAL HIGH (ref 65–99)
Potassium: 4.1 mmol/L (ref 3.5–5.1)
SODIUM: 137 mmol/L (ref 135–145)
Total Bilirubin: 0.6 mg/dL (ref 0.3–1.2)
Total Protein: 7.1 g/dL (ref 6.5–8.1)

## 2018-02-06 LAB — CBC WITH DIFFERENTIAL/PLATELET
BASOS PCT: 0 %
Basophils Absolute: 0 10*3/uL (ref 0–0.1)
Eosinophils Absolute: 0 10*3/uL (ref 0–0.7)
Eosinophils Relative: 0 %
HEMATOCRIT: 31.8 % — AB (ref 40.0–52.0)
HEMOGLOBIN: 11.2 g/dL — AB (ref 13.0–18.0)
LYMPHS ABS: 0.2 10*3/uL — AB (ref 1.0–3.6)
Lymphocytes Relative: 7 %
MCH: 30.9 pg (ref 26.0–34.0)
MCHC: 35.3 g/dL (ref 32.0–36.0)
MCV: 87.6 fL (ref 80.0–100.0)
MONO ABS: 0.3 10*3/uL (ref 0.2–1.0)
MONOS PCT: 10 %
NEUTROS ABS: 2.7 10*3/uL (ref 1.4–6.5)
Neutrophils Relative %: 83 %
Platelets: 247 10*3/uL (ref 150–440)
RBC: 3.63 MIL/uL — ABNORMAL LOW (ref 4.40–5.90)
RDW: 13.8 % (ref 11.5–14.5)
WBC: 3.3 10*3/uL — ABNORMAL LOW (ref 3.8–10.6)

## 2018-02-06 MED ORDER — DIPHENHYDRAMINE HCL 50 MG/ML IJ SOLN
50.0000 mg | Freq: Once | INTRAMUSCULAR | Status: AC
  Administered 2018-02-06: 50 mg via INTRAVENOUS
  Filled 2018-02-06: qty 1

## 2018-02-06 MED ORDER — SODIUM CHLORIDE 0.9 % IV SOLN
Freq: Once | INTRAVENOUS | Status: AC
  Administered 2018-02-06: 10:00:00 via INTRAVENOUS
  Filled 2018-02-06: qty 1000

## 2018-02-06 MED ORDER — SODIUM CHLORIDE 0.9 % IV SOLN
45.0000 mg/m2 | Freq: Once | INTRAVENOUS | Status: AC
  Administered 2018-02-06: 96 mg via INTRAVENOUS
  Filled 2018-02-06: qty 16

## 2018-02-06 MED ORDER — FLUCONAZOLE IN SODIUM CHLORIDE 100-0.9 MG/50ML-% IV SOLN
100.0000 mg | Freq: Once | INTRAVENOUS | Status: AC
  Administered 2018-02-06: 100 mg via INTRAVENOUS
  Filled 2018-02-06: qty 50

## 2018-02-06 MED ORDER — CARBOPLATIN CHEMO INJECTION 450 MG/45ML
164.2000 mg | Freq: Once | INTRAVENOUS | Status: AC
  Administered 2018-02-06: 160 mg via INTRAVENOUS
  Filled 2018-02-06: qty 16

## 2018-02-06 MED ORDER — SODIUM CHLORIDE 0.9% FLUSH
10.0000 mL | INTRAVENOUS | Status: DC | PRN
  Administered 2018-02-06: 10 mL via INTRAVENOUS
  Filled 2018-02-06: qty 10

## 2018-02-06 MED ORDER — FLUCONAZOLE IN SODIUM CHLORIDE 400-0.9 MG/200ML-% IV SOLN: 100.0000 mg | INTRAVENOUS | Status: DC

## 2018-02-06 MED ORDER — DEXAMETHASONE SODIUM PHOSPHATE 10 MG/ML IJ SOLN
10.0000 mg | Freq: Once | INTRAMUSCULAR | Status: AC
  Administered 2018-02-06: 10 mg via INTRAVENOUS
  Filled 2018-02-06: qty 1

## 2018-02-06 MED ORDER — HEPARIN SOD (PORK) LOCK FLUSH 100 UNIT/ML IV SOLN
500.0000 [IU] | Freq: Once | INTRAVENOUS | Status: AC
  Administered 2018-02-06: 500 [IU] via INTRAVENOUS
  Filled 2018-02-06: qty 5

## 2018-02-06 MED ORDER — PALONOSETRON HCL INJECTION 0.25 MG/5ML
0.2500 mg | Freq: Once | INTRAVENOUS | Status: AC
  Administered 2018-02-06: 0.25 mg via INTRAVENOUS
  Filled 2018-02-06: qty 5

## 2018-02-06 NOTE — Progress Notes (Signed)
Hematology/Oncology  Follow Up note [facility] Telephone:(336) [phone] Fax:(336) [phone]   Patient Care Team: Albina Billet, MD as PCP - General (Internal Medicine) Albina Billet, MD (Internal Medicine) Bary Castilla [place] Gleason, MD (General Surgery)  REFERRING PROVIDER: Dr.McQueen CHIEF COMPLAINTS/PURPOSE OF CONSULTATION:  Evaluation of newly diagnosed head and neck cancer.  HISTORY OF PRESENTING ILLNESS:  Ivan Appleyard. is a  62 y.o.  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 Tami Ribas. With ENT examination, he was found to have midline tongue base is to come down to 2 Weekly which is 40 mg pedunculated mass, 3cm.  And had CT soft tissue neck which confirmed pronounced lymphadenopathy in the right neck including conglomerate partially necrotic nodal mass spanning level III, 4 and 5, measuring 5.2 x 5.5 x 3.7 cm. Additional pathological nodes and right level 2 about 2.5 cm. There is asymmetry in the right tonsillar region. # Biopsy of the right neck mass and pathology revealed squamous cancer. P16 positive.  Current Cancer Treatment Cisplatin 100 mg/m2 every 3 weeks with concurrent RT which started on 12/31/2017.  S/p one dose of Cisplatin. Cisplatin discontinued due to nephrotoxicity.  Switch to weekly Carboplatin (AUC 2) / Taxol 71mmg/m2    INTERVAL HISTORY Ivan Garcia Jonahtan Manseau. is a 62 y.o. male who has above history reviewed by me today presents assessment prior to weekly carboplatin and Taxol.  He has been getting daily hydration session and started on IV diflucan daily for thrush treatment. Ritta Slot has improved significantly. He reports swallowing difficulty has slightly improved after thrush cleared. He is able to eat some soft food, yogurt etc. He will pick up magic mouth [facility] [place] [facility].   Review of Systems  Constitutional: Negative for chills, diaphoresis, fever and  malaise/fatigue.  HENT: Positive for sore throat. Negative for congestion, ear discharge, ear pain, hearing loss and tinnitus.        Sore throat  Eyes: Negative for photophobia, pain, discharge and redness.  Respiratory: Negative for cough, sputum production and shortness of breath.   Cardiovascular: Negative for chest pain, orthopnea and leg swelling.  Gastrointestinal: Negative for constipation, diarrhea, heartburn, melena, nausea and vomiting.  Genitourinary: Negative for dysuria, frequency, hematuria and urgency.  Musculoskeletal: Negative for myalgias and neck pain.  Skin: Negative for rash.  Neurological: Negative for dizziness, tremors and sensory change.  Endo/Heme/Allergies: Negative for polydipsia. Does not bruise/bleed easily.  Psychiatric/Behavioral: Negative for depression, substance abuse and suicidal ideas. The patient is not nervous/anxious.     MEDICAL HISTORY:  Past Medical History:  Diagnosis Date  . Arthritis   . Cancer ([place])    Head and neck cancer  . Hemorrhoids   . Hyperlipidemia   . Hypertension     SURGICAL HISTORY: Past Surgical History:  Procedure Laterality Date  . COLONOSCOPY WITH PROPOFOL N/A 04/04/2017   Procedure: COLONOSCOPY WITH PROPOFOL;  Surgeon: Robert Bellow, MD;  Location: [facility] ENDOSCOPY;  Service: Endoscopy;  Laterality: N/A;  . MANDIBLE SURGERY    . PORTA CATH INSERTION N/A 12/19/2017   Procedure: PORTA CATH INSERTION;  Surgeon: Algernon Huxley, MD;  Location: [place] [facility] LAB;  Service: Cardiovascular;  Laterality: N/A;  . TONSILLECTOMY      SOCIAL HISTORY: Social History   Socioeconomic History  . Marital status: Single    Spouse name: Not on file  . Number of children: Not on file  .  Years of education: Not on file  . Highest education level: Not on file  Social Needs  . Financial resource strain: Not on file  . Food insecurity - worry: Not on file  . Food insecurity - inability: Not on file  . Transportation needs -  medical: Not on file  . Transportation needs - non-medical: Not on file  Occupational History  . Not on file  Tobacco Use  . Smoking status: Never Smoker  . Smokeless tobacco: Never Used  Substance and Sexual Activity  . Alcohol use: No  . Drug use: No  . Sexual activity: Not on file  Other Topics Concern  . Not on file  Social History Narrative  . Not on file    FAMILY HISTORY: Family History  Problem Relation Age of Onset  . Breast cancer Mother   . Asthma Mother   . Congestive Heart Failure Mother   . Prostate cancer Father   . Brain cancer Father   . Bladder Cancer Father     ALLERGIES:  has No Known Allergies.  MEDICATIONS:  Current Outpatient Medications  Medication Sig Dispense Refill  . amLODipine (NORVASC) 2.5 MG tablet Take 1 tablet (2.5 mg total) by mouth daily. 30 tablet 0  . dexamethasone (DECADRON) 4 MG tablet Take 1 tablet (4 mg total) by mouth 2 (two) times daily with a meal. 30 tablet 0  . lansoprazole (PREVACID) 30 MG capsule Take 1 capsule (30 mg total) by mouth daily at 12 noon. 30 capsule 4  . lidocaine (XYLOCAINE) 2 % solution Use as directed 20 mLs in the mouth or throat every 4 (four) hours as needed for mouth pain (throat pain). 300 mL 0  . magic mouthwash SOLN Take 5 mLs by mouth 3 (three) times daily. 100 mL 0  . Multiple Vitamins-Minerals (MULTIVITAMIN WITH MINERALS) tablet Take 1 tablet by mouth daily.    Marland Kitchen oxyCODONE-acetaminophen (PERCOCET/ROXICET) 5-325 MG tablet Take 1 tablet by mouth every 6 (six) hours as needed for severe pain. 56 tablet 0  . rosuvastatin (CRESTOR) 10 MG tablet Take 10 mg by mouth every evening.  4  . sucralfate (CARAFATE) 1 g tablet Take 1 tablet (1 g total) by mouth 3 (three) times daily before meals. 90 tablet 5   No current facility-administered medications for this visit.    Facility-Administered Medications Ordered in Other Visits  Medication Dose Route Frequency Provider Last Rate Last Dose  . CARBOplatin  (PARAPLATIN) 160 mg in sodium chloride 0.9 % 250 mL chemo infusion  160 mg Intravenous Once Earlie Server, MD      . dexamethasone (DECADRON) injection 10 mg  10 mg Intravenous Once Earlie Server, MD      . diphenhydrAMINE (BENADRYL) injection 50 mg  50 mg Intravenous Once Earlie Server, MD      . fluconazole (DIFLUCAN) IVPB 100 mg  100 mg Intravenous Once Earlie Server, MD   100 mg at 02/06/18 1021  . heparin lock flush 100 unit/mL  500 Units Intravenous Once Earlie Server, MD      . PACLitaxel (TAXOL) 96 mg in sodium chloride 0.9 % 250 mL chemo infusion (</= 80mg /m2)  45 mg/m2 (Treatment Plan Recorded) Intravenous Once Earlie Server, MD      . palonosetron (ALOXI) injection 0.25 mg  0.25 mg Intravenous Once Earlie Server, MD      . sodium chloride flush (NS) 0.9 % injection 10 mL  10 mL Intravenous PRN Earlie Server, MD   10 mL at 02/06/18 [phone]  PHYSICAL EXAMINATION: ECOG PERFORMANCE STATUS: 1 - Symptomatic but completely ambulatory Vitals:   02/06/18 0842 02/06/18 0852  BP:  139/79  Pulse:  98  Resp: 16   Temp:  (!) 97.5 F (36.4 C)   Filed Weights   02/06/18 0842  Weight: 203 lb 6.4 oz (92.3 kg)    Physical Exam  Constitutional: He is oriented to person, place, and time. No distress.  HENT:  Head: Normocephalic and atraumatic.  Mouth/Throat: Oropharynx is clear and moist. No oropharyngeal exudate.  thrush  Eyes: Conjunctivae and EOM are normal. Pupils are equal, round, and reactive to light. Left eye exhibits no discharge. No scleral icterus.  Neck: Normal range of motion. Neck supple. No JVD present.  Right side neck mas decrease in size.. Tongue base mass.  Cardiovascular: Normal rate, regular rhythm and normal heart sounds. Exam reveals no friction rub.  No murmur heard. Pulmonary/Chest: Breath sounds normal. He has no wheezes. He has no rales.  Abdominal: Soft. Bowel sounds are normal. He exhibits no distension. There is no tenderness. There is no rebound and no guarding.  Musculoskeletal: Normal range of  motion. He exhibits no edema or tenderness.  Lymphadenopathy:    He has cervical adenopathy.  Neurological: He is alert and oriented to person, place, and time. No cranial nerve deficit. Gait normal.  Skin: Skin is dry. No rash noted. He is not diaphoretic. No erythema.  2cm x 2cm skin break down on his neck on the right side.   Psychiatric: Mood, memory, affect and judgment normal.     LABORATORY DATA:  I have reviewed the data as listed Lab Results  Component Value Date   WBC 3.3 (L) 02/06/2018   HGB 11.2 (L) 02/06/2018   HCT 31.8 (L) 02/06/2018   MCV 87.6 02/06/2018   PLT 247 02/06/2018   Recent Labs    01/30/18 0821 02/04/18 1050 02/06/18 0833  NA 135 138 137  K 4.1 4.2 4.1  CL 103 105 106  CO2 25 25 22   GLUCOSE 117* 123* 121*  BUN 26* 31* 41*  CREATININE 1.96* 1.74* 1.83*  CALCIUM 9.2 9.1 9.3  GFRNONAA 35* 40* 38*  GFRAA 40* 47* 44*  PROT 7.2 7.2 7.1  ALBUMIN 3.4* 3.3* 3.3*  AST 17 14* 21  ALT 15* 13* 16*  ALKPHOS 82 71 64  BILITOT 0.7 0.6 0.6     RADIOGRAPHIC STUDIES: I have personally reviewed the radiological images as listed and agreed with the findings in the report. PET scan 12/13/2017 1. Primary hypermetabolic mucosal lesion in the left oropharynx/tongue base with right-sided bulky multilevel lymphadenopathy. 2. No findings for metastatic disease involving the chest, abdomen, pelvis or bony structures.  ASSESSMENT & PLAN:  Clinically he has cT2 cN2 disease, stage II.  1. 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[place])   2. Thrush   3. Odynophagia   4. Encounter for antineoplastic chemotherapy    # Proceed with weekly carbo and Taxol.  # Poor po Intake: will continue to support him with daily IV normal saline 1 L x1 # Odynophagia, due to RT. Rx of magic mouth wash was provided, [facility] [place] [facility].  # Ritta Slot has improved. Finish daily diflucan 100mg  IV 5 days course.  Dose adjusted to his kidney function.   Will arrange him to get  hydration session on 02/07/2018 and 02/08/2018  Continue follow up with RadOnc for RT.   All questions were answered. The patient knows to call the clinic  with any problems questions or concerns.  Return of visit: 1 week with labs and evaluation prior to weekly Carboplatin and taxol.     Earlie Server, MD, PhD Hematology Oncology [facility] at [facility] Pager- [phone] 02/06/2018

## 2018-02-06 NOTE — Progress Notes (Signed)
Patient here for follow up. Throat is still sore but getting better. He has a persistent  Cough from new BP medication.

## 2018-02-07 ENCOUNTER — Ambulatory Visit: Admission: RE | Admit: 2018-02-07 | Payer: BLUE CROSS/BLUE SHIELD | Source: Ambulatory Visit

## 2018-02-07 ENCOUNTER — Ambulatory Visit: Payer: BLUE CROSS/BLUE SHIELD

## 2018-02-07 ENCOUNTER — Inpatient Hospital Stay: Payer: BLUE CROSS/BLUE SHIELD

## 2018-02-07 ENCOUNTER — Other Ambulatory Visit: Payer: Self-pay | Admitting: *Deleted

## 2018-02-07 VITALS — BP 136/76 | HR 96 | Temp 98.1°F | Resp 18

## 2018-02-07 DIAGNOSIS — C109 Malignant neoplasm of oropharynx, unspecified: Secondary | ICD-10-CM | POA: Diagnosis not present

## 2018-02-07 MED ORDER — FLUCONAZOLE IN SODIUM CHLORIDE 400-0.9 MG/200ML-% IV SOLN: 100.0000 mg | INTRAVENOUS | Status: DC

## 2018-02-07 MED ORDER — SODIUM CHLORIDE 0.9 % IV SOLN
INTRAVENOUS | Status: DC
  Administered 2018-02-07: 14:00:00 via INTRAVENOUS
  Filled 2018-02-07: qty 1000

## 2018-02-07 MED ORDER — SODIUM CHLORIDE 0.9 % IV SOLN
Freq: Once | INTRAVENOUS | Status: AC
  Administered 2018-02-07: 14:00:00 via INTRAVENOUS
  Filled 2018-02-07: qty 1000

## 2018-02-07 MED ORDER — HEPARIN SOD (PORK) LOCK FLUSH 100 UNIT/ML IV SOLN
500.0000 [IU] | Freq: Once | INTRAVENOUS | Status: AC
  Administered 2018-02-07: 500 [IU] via INTRAVENOUS

## 2018-02-07 MED ORDER — SODIUM CHLORIDE 0.9% FLUSH
10.0000 mL | INTRAVENOUS | Status: DC | PRN
  Administered 2018-02-07: 10 mL via INTRAVENOUS
  Filled 2018-02-07: qty 10

## 2018-02-07 MED ORDER — SILVER SULFADIAZINE 1 % EX CREA: 1.0000 "application " | TOPICAL_CREAM | Freq: Two times a day (BID) | CUTANEOUS | 2 refills | Status: DC

## 2018-02-07 MED ORDER — FLUCONAZOLE 100MG IVPB
100.0000 mg | Freq: Once | INTRAVENOUS | Status: AC
  Administered 2018-02-07: 100 mg via INTRAVENOUS
  Filled 2018-02-07: qty 50

## 2018-02-07 MED ORDER — HEPARIN SOD (PORK) LOCK FLUSH 100 UNIT/ML IV SOLN
INTRAVENOUS | Status: AC
  Filled 2018-02-07: qty 5

## 2018-02-07 NOTE — Progress Notes (Signed)
Patient reports he is back to eating solid foods and able to talk.

## 2018-02-07 NOTE — Progress Notes (Signed)
Nutrition Follow-up:  Patient seen in infusion today for IV fluids for nutrition follow up.  Patient reports that he has been able to eat more solid foods and throat is better.  Reports has been using magic mouthwash with success.  Reports that he made beef stew with vegetables and has tolerated that well.  Reports has been drinking ensure plus shakes and regular homemade shake with protein powder.  Drinking water during visit  Medications: reviewed  Labs: reviewed  Anthropometrics:   Noted weight decreased to 203 lb 6.4 oz on 3/6 from 204 lb 1.6 oz on 2/27.     NUTRITION DIAGNOSIS: Predicted suboptimal intake continues   INTERVENTION:  Encouraged patient to continue to prepare soft foods to easy swallowing. Reviewed calories and protein goals with patient today to meet each day. Provided patient with 2nd case of ensure plus today.     MONITORING, EVALUATION, GOAL: patient will consume adequate calories and protein   NEXT VISIT: phone follow-up in 1 week  Belvie Iribe [name], [place], [place] Registered Dietitian [phone] (pager)

## 2018-02-08 ENCOUNTER — Ambulatory Visit: Payer: BLUE CROSS/BLUE SHIELD

## 2018-02-08 ENCOUNTER — Inpatient Hospital Stay: Payer: BLUE CROSS/BLUE SHIELD

## 2018-02-08 VITALS — BP 120/80 | HR 87 | Temp 97.3°F | Resp 18

## 2018-02-08 DIAGNOSIS — C109 Malignant neoplasm of oropharynx, unspecified: Secondary | ICD-10-CM | POA: Diagnosis not present

## 2018-02-08 MED ORDER — HEPARIN SOD (PORK) LOCK FLUSH 100 UNIT/ML IV SOLN
500.0000 [IU] | Freq: Once | INTRAVENOUS | Status: AC
  Administered 2018-02-08: 500 [IU] via INTRAVENOUS

## 2018-02-08 MED ORDER — HEPARIN SOD (PORK) LOCK FLUSH 100 UNIT/ML IV SOLN
INTRAVENOUS | Status: AC
  Filled 2018-02-08: qty 5

## 2018-02-08 MED ORDER — SODIUM CHLORIDE 0.9 % IV SOLN
Freq: Once | INTRAVENOUS | Status: AC
  Administered 2018-02-08: 12:00:00 via INTRAVENOUS
  Filled 2018-02-08: qty 1000

## 2018-02-08 MED ORDER — FLUCONAZOLE IN SODIUM CHLORIDE 400-0.9 MG/200ML-% IV SOLN: 100.0000 mg | INTRAVENOUS | Status: DC

## 2018-02-08 MED ORDER — FLUCONAZOLE IN SODIUM CHLORIDE 100-0.9 MG/50ML-% IV SOLN
100.0000 mg | Freq: Once | INTRAVENOUS | Status: AC
  Administered 2018-02-08: 100 mg via INTRAVENOUS
  Filled 2018-02-08: qty 50

## 2018-02-11 ENCOUNTER — Ambulatory Visit: Payer: BLUE CROSS/BLUE SHIELD

## 2018-02-11 ENCOUNTER — Ambulatory Visit: Admission: RE | Admit: 2018-02-11 | Payer: BLUE CROSS/BLUE SHIELD | Source: Ambulatory Visit

## 2018-02-12 ENCOUNTER — Ambulatory Visit: Payer: BLUE CROSS/BLUE SHIELD

## 2018-02-13 ENCOUNTER — Ambulatory Visit: Payer: BLUE CROSS/BLUE SHIELD

## 2018-02-13 ENCOUNTER — Other Ambulatory Visit: Payer: BLUE CROSS/BLUE SHIELD

## 2018-02-13 ENCOUNTER — Inpatient Hospital Stay: Payer: BLUE CROSS/BLUE SHIELD | Admitting: Oncology

## 2018-02-14 ENCOUNTER — Ambulatory Visit: Payer: BLUE CROSS/BLUE SHIELD

## 2018-02-14 ENCOUNTER — Telehealth: Payer: Self-pay

## 2018-02-14 NOTE — Telephone Encounter (Signed)
Nutrition  Called patient for nutrition follow-up.  Left message on voicemail.  Ivan Garcia [name], [place], [place] Registered Dietitian [phone] (pager)

## 2018-02-15 ENCOUNTER — Ambulatory Visit: Payer: BLUE CROSS/BLUE SHIELD

## 2018-02-18 ENCOUNTER — Other Ambulatory Visit: Payer: Self-pay

## 2018-02-18 ENCOUNTER — Ambulatory Visit: Admission: RE | Admit: 2018-02-18 | Payer: BLUE CROSS/BLUE SHIELD | Source: Ambulatory Visit

## 2018-02-18 ENCOUNTER — Other Ambulatory Visit: Payer: Self-pay | Admitting: *Deleted

## 2018-02-18 ENCOUNTER — Ambulatory Visit: Payer: BLUE CROSS/BLUE SHIELD

## 2018-02-18 MED ORDER — DEXAMETHASONE 4 MG PO TABS: 4.0000 mg | ORAL_TABLET | Freq: Every day | ORAL | 0 refills | Status: DC

## 2018-02-19 ENCOUNTER — Ambulatory Visit: Payer: BLUE CROSS/BLUE SHIELD

## 2018-02-20 ENCOUNTER — Inpatient Hospital Stay: Payer: BLUE CROSS/BLUE SHIELD

## 2018-02-20 ENCOUNTER — Inpatient Hospital Stay: Payer: BLUE CROSS/BLUE SHIELD | Admitting: Oncology

## 2018-02-20 NOTE — Progress Notes (Deleted)
Hematology/Oncology  Follow Up note [facility] [phone] Fax:([phone]) [phone]   Patient Care Team: Albina Billet, MD as PCP - General (Internal Medicine) Albina Billet, MD (Internal Medicine) Bary Castilla Forest Gleason, MD (General Surgery)  REFERRING PROVIDER: Dr.McQueen CHIEF COMPLAINTS/PURPOSE OF CONSULTATION:  Evaluation of newly diagnosed head and neck cancer.  HISTORY OF PRESENTING ILLNESS:  Ivan Garcia. is a  62 y.o.  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 Tami Ribas. With ENT examination, he was found to have midline tongue base is to come down to 2 Weekly which is 40 mg pedunculated mass, 3cm.  And had CT soft tissue neck which confirmed pronounced lymphadenopathy in the right neck including conglomerate partially necrotic nodal mass spanning level III, 4 and 5, measuring 5.2 x 5.5 x 3.7 cm. Additional pathological nodes and right level 2 about 2.5 cm. There is asymmetry in the right tonsillar region. # Biopsy of the right neck mass and pathology revealed squamous cancer. P16 positive.  Current Cancer Treatment Cisplatin 100 mg/m2 every 3 weeks with concurrent RT which started on 12/31/2017.  S/p one dose of Cisplatin. Cisplatin discontinued due to nephrotoxicity.  Switch to weekly Carboplatin (AUC 2) / Taxol 64mmg/m2    INTERVAL HISTORY Ivan Garcia. is a 62 y.o. male who has above history reviewed by me today presents assessment prior to weekly carboplatin and Taxol.  He has been getting daily hydration session and started on IV diflucan daily for thrush treatment. Ritta Slot has improved significantly. He reports swallowing difficulty has slightly improved after thrush cleared. He is able to eat some soft food, yogurt etc. He will pick up magic mouth wash from [facility] today.   Review of Systems  Constitutional: Negative for chills, diaphoresis, fever and  malaise/fatigue.  HENT: Positive for sore throat. Negative for congestion, ear discharge, ear pain, hearing loss and tinnitus.        Sore throat  Eyes: Negative for photophobia, pain, discharge and redness.  Respiratory: Negative for cough, sputum production and shortness of breath.   Cardiovascular: Negative for chest pain, orthopnea and leg swelling.  Gastrointestinal: Negative for constipation, diarrhea, heartburn, melena, nausea and vomiting.  Genitourinary: Negative for dysuria, frequency, hematuria and urgency.  Musculoskeletal: Negative for myalgias and neck pain.  Skin: Negative for rash.  Neurological: Negative for dizziness, tremors and sensory change.  Endo/Heme/Allergies: Negative for polydipsia. Does not bruise/bleed easily.  Psychiatric/Behavioral: Negative for depression, substance abuse and suicidal ideas. The patient is not nervous/anxious.     MEDICAL HISTORY:  Past Medical History:  Diagnosis Date  . Arthritis   . Cancer ([place])    Head and neck cancer  . Hemorrhoids   . Hyperlipidemia   . Hypertension     SURGICAL HISTORY: Past Surgical History:  Procedure Laterality Date  . COLONOSCOPY WITH PROPOFOL N/A 04/04/2017   Procedure: COLONOSCOPY WITH PROPOFOL;  Surgeon: Robert Bellow, MD;  Location: [facility] ENDOSCOPY;  Service: Endoscopy;  Laterality: N/A;  . MANDIBLE SURGERY    . PORTA CATH INSERTION N/A 12/19/2017   Procedure: PORTA CATH INSERTION;  Surgeon: Algernon Huxley, MD;  Location: [place] [facility] LAB;  Service: Cardiovascular;  Laterality: N/A;  . TONSILLECTOMY      SOCIAL HISTORY: Social History   Socioeconomic History  . Marital status: Single    Spouse name: Not on file  . Number of children: Not on file  .  Years of education: Not on file  . Highest education level: Not on file  Social Needs  . Financial resource strain: Not on file  . Food insecurity - worry: Not on file  . Food insecurity - inability: Not on file  . Transportation needs -  medical: Not on file  . Transportation needs - non-medical: Not on file  Occupational History  . Not on file  Tobacco Use  . Smoking status: Never Smoker  . Smokeless tobacco: Never Used  Substance and Sexual Activity  . Alcohol use: No  . Drug use: No  . Sexual activity: Not on file  Other Topics Concern  . Not on file  Social History Narrative  . Not on file    FAMILY HISTORY: Family History  Problem Relation Age of Onset  . Breast cancer Mother   . Asthma Mother   . Congestive Heart Failure Mother   . Prostate cancer Father   . Brain cancer Father   . Bladder Cancer Father     ALLERGIES:  has No Known Allergies.  MEDICATIONS:  Current Outpatient Medications  Medication Sig Dispense Refill  . amLODipine (NORVASC) 2.5 MG tablet Take 1 tablet (2.5 mg total) by mouth daily. 30 tablet 0  . dexamethasone (DECADRON) 4 MG tablet Take 1 tablet (4 mg total) by mouth 2 (two) times daily with a meal. 30 tablet 0  . dexamethasone (DECADRON) 4 MG tablet Take 1 tablet (4 mg total) by mouth daily. 1 tablet daily x5 days then 1/2 tablet daily until finished. 10 tablet 0  . lansoprazole (PREVACID) 30 MG capsule Take 1 capsule (30 mg total) by mouth daily at 12 noon. 30 capsule 4  . lidocaine (XYLOCAINE) 2 % solution Use as directed 20 mLs in the mouth or throat every 4 (four) hours as needed for mouth pain (throat pain). 300 mL 0  . magic mouthwash SOLN Take 5 mLs by mouth 3 (three) times daily. 100 mL 0  . Multiple Vitamins-Minerals (MULTIVITAMIN WITH MINERALS) tablet Take 1 tablet by mouth daily.    Marland Kitchen oxyCODONE-acetaminophen (PERCOCET/ROXICET) 5-325 MG tablet Take 1 tablet by mouth every 6 (six) hours as needed for severe pain. 56 tablet 0  . rosuvastatin (CRESTOR) 10 MG tablet Take 10 mg by mouth every evening.  4  . silver sulfADIAZINE (SILVADENE) 1 % cream Apply 1 application topically 2 (two) times daily. 50 g 2  . sucralfate (CARAFATE) 1 g tablet Take 1 tablet (1 g total) by mouth  3 (three) times daily before meals. 90 tablet 5   No current facility-administered medications for this visit.      PHYSICAL EXAMINATION: ECOG PERFORMANCE STATUS: 1 - Symptomatic but completely ambulatory There were no vitals filed for this visit. There were no vitals filed for this visit.  Physical Exam  Constitutional: He is oriented to person, place, and time. No distress.  HENT:  Head: Normocephalic and atraumatic.  Mouth/Throat: Oropharynx is clear and moist. No oropharyngeal exudate.  thrush  Eyes: Conjunctivae and EOM are normal. Pupils are equal, round, and reactive to light. Left eye exhibits no discharge. No scleral icterus.  Neck: Normal range of motion. Neck supple. No JVD present.  Right side neck mas decrease in size.. Tongue base mass.  Cardiovascular: Normal rate, regular rhythm and normal heart sounds. Exam reveals no friction rub.  No murmur heard. Pulmonary/Chest: Breath sounds normal. He has no wheezes. He has no rales.  Abdominal: Soft. Bowel sounds are normal. He exhibits no distension.  There is no tenderness. There is no rebound and no guarding.  Musculoskeletal: Normal range of motion. He exhibits no edema or tenderness.  Lymphadenopathy:    He has cervical adenopathy.  Neurological: He is alert and oriented to person, place, and time. No cranial nerve deficit. Gait normal.  Skin: Skin is dry. No rash noted. He is not diaphoretic. No erythema.  2cm x 2cm skin break down on his neck on the right side.   Psychiatric: Mood, memory, affect and judgment normal.     LABORATORY DATA:  I have reviewed the data as listed Lab Results  Component Value Date   WBC 3.3 (L) 02/06/2018   HGB 11.2 (L) 02/06/2018   HCT 31.8 (L) 02/06/2018   MCV 87.6 02/06/2018   PLT 247 02/06/2018   Recent Labs    01/30/18 0821 02/04/18 1050 02/06/18 0833  NA 135 138 137  K 4.1 4.2 4.1  CL 103 105 106  CO2 25 25 22   GLUCOSE 117* 123* 121*  BUN 26* 31* 41*  CREATININE 1.96*  1.74* 1.83*  CALCIUM 9.2 9.1 9.3  GFRNONAA 35* 40* 38*  GFRAA 40* 47* 44*  PROT 7.2 7.2 7.1  ALBUMIN 3.4* 3.3* 3.3*  AST 17 14* 21  ALT 15* 13* 16*  ALKPHOS 82 71 64  BILITOT 0.7 0.6 0.6     RADIOGRAPHIC STUDIES: I have personally reviewed the radiological images as listed and agreed with the findings in the report. PET scan 12/13/2017 1. Primary hypermetabolic mucosal lesion in the left oropharynx/tongue base with right-sided bulky multilevel lymphadenopathy. 2. No findings for metastatic disease involving the chest, abdomen, pelvis or bony structures.  ASSESSMENT & PLAN:  Clinically he has cT2 cN2 disease, stage II.  1. 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[place])   2. Odynophagia   3. Encounter for antineoplastic chemotherapy   4. Goals of care, counseling/discussion    # Proceed with weekly carbo and Taxol.  # Poor po Intake: will continue to support him with daily IV normal saline 1 L x1 # Odynophagia, due to RT. Rx of magic mouth wash was provided, he will pick up at [facility] today.  # Ritta Slot has improved. Finish daily diflucan 100mg  IV 5 days course.  Dose adjusted to his kidney function.   Will arrange him to get hydration session on 02/07/2018 and 02/08/2018  Continue follow up with RadOnc for RT.   All questions were answered. The patient knows to call the clinic with any problems questions or concerns.  Return of visit: 1 week with labs and evaluation prior to weekly Carboplatin and taxol.     Earlie Server, MD, PhD Hematology Oncology [facility] at [facility] Pager- [phone] 02/20/2018

## 2018-02-25 ENCOUNTER — Ambulatory Visit
Admission: RE | Admit: 2018-02-25 | Discharge: 2018-02-25 | Disposition: A | Discharge status: Home / Self Care | Payer: BLUE CROSS/BLUE SHIELD | Source: Ambulatory Visit | Attending: Radiation Oncology | Admitting: Radiation Oncology

## 2018-02-25 ENCOUNTER — Other Ambulatory Visit: Payer: Self-pay | Admitting: *Deleted

## 2018-02-25 ENCOUNTER — Other Ambulatory Visit: Payer: BLUE CROSS/BLUE SHIELD

## 2018-02-25 ENCOUNTER — Ambulatory Visit: Payer: BLUE CROSS/BLUE SHIELD

## 2018-02-25 DIAGNOSIS — Z510 Encounter for antineoplastic radiation therapy: Secondary | ICD-10-CM | POA: Diagnosis not present

## 2018-02-25 DIAGNOSIS — C109 Malignant neoplasm of oropharynx, unspecified: Secondary | ICD-10-CM

## 2018-02-26 ENCOUNTER — Ambulatory Visit
Admission: RE | Admit: 2018-02-26 | Discharge: 2018-02-26 | Disposition: A | Discharge status: Home / Self Care | Payer: BLUE CROSS/BLUE SHIELD | Source: Ambulatory Visit | Attending: Radiation Oncology | Admitting: Radiation Oncology

## 2018-02-26 DIAGNOSIS — Z510 Encounter for antineoplastic radiation therapy: Secondary | ICD-10-CM | POA: Diagnosis not present

## 2018-02-27 ENCOUNTER — Inpatient Hospital Stay: Payer: BLUE CROSS/BLUE SHIELD

## 2018-02-27 ENCOUNTER — Telehealth: Payer: Self-pay | Admitting: *Deleted

## 2018-02-27 ENCOUNTER — Inpatient Hospital Stay (HOSPITAL_BASED_OUTPATIENT_CLINIC_OR_DEPARTMENT_OTHER): Payer: BLUE CROSS/BLUE SHIELD | Admitting: Oncology

## 2018-02-27 ENCOUNTER — Other Ambulatory Visit: Payer: Self-pay | Admitting: Oncology

## 2018-02-27 ENCOUNTER — Encounter: Payer: Self-pay | Admitting: Oncology

## 2018-02-27 VITALS — BP 125/85 | HR 106 | Temp 97.7°F | Resp 18 | Wt 202.0 lb

## 2018-02-27 VITALS — BP 127/82 | HR 88 | Resp 20

## 2018-02-27 DIAGNOSIS — R070 Pain in throat: Secondary | ICD-10-CM | POA: Diagnosis not present

## 2018-02-27 DIAGNOSIS — J029 Acute pharyngitis, unspecified: Secondary | ICD-10-CM

## 2018-02-27 DIAGNOSIS — C109 Malignant neoplasm of oropharynx, unspecified: Secondary | ICD-10-CM

## 2018-02-27 DIAGNOSIS — R000 Tachycardia, unspecified: Secondary | ICD-10-CM | POA: Diagnosis not present

## 2018-02-27 DIAGNOSIS — L598 Other specified disorders of the skin and subcutaneous tissue related to radiation: Secondary | ICD-10-CM

## 2018-02-27 LAB — COMPREHENSIVE METABOLIC PANEL
ALBUMIN: 3.1 g/dL — AB (ref 3.5–5.0)
ALK PHOS: 65 U/L (ref 38–126)
ALT: 39 U/L (ref 17–63)
ANION GAP: 10 (ref 5–15)
AST: 23 U/L (ref 15–41)
BUN: 50 mg/dL — AB (ref 6–20)
CALCIUM: 9 mg/dL (ref 8.9–10.3)
CO2: 25 mmol/L (ref 22–32)
Chloride: 103 mmol/L (ref 101–111)
Creatinine, Ser: 1.47 mg/dL — ABNORMAL HIGH (ref 0.61–1.24)
GFR calc Af Amer: 57 mL/min — ABNORMAL LOW (ref >60)
GFR calc non Af Amer: 49 mL/min — ABNORMAL LOW (ref >60)
Glucose, Bld: 111 mg/dL — ABNORMAL HIGH (ref 65–99)
Potassium: 3.8 mmol/L (ref 3.5–5.1)
SODIUM: 138 mmol/L (ref 135–145)
Total Bilirubin: 0.7 mg/dL (ref 0.3–1.2)
Total Protein: 6.3 g/dL — ABNORMAL LOW (ref 6.5–8.1)

## 2018-02-27 LAB — CBC WITH DIFFERENTIAL/PLATELET
BASOS PCT: 1 %
Basophils Absolute: 0 10*3/uL (ref 0–0.1)
EOS PCT: 1 %
Eosinophils Absolute: 0 10*3/uL (ref 0–0.7)
HEMATOCRIT: 33.5 % — AB (ref 40.0–52.0)
Hemoglobin: 11.5 g/dL — ABNORMAL LOW (ref 13.0–18.0)
Lymphocytes Relative: 9 %
Lymphs Abs: 0.2 10*3/uL — ABNORMAL LOW (ref 1.0–3.6)
MCH: 31.2 pg (ref 26.0–34.0)
MCHC: 34.4 g/dL (ref 32.0–36.0)
MCV: 90.8 fL (ref 80.0–100.0)
MONOS PCT: 6 %
Monocytes Absolute: 0.1 10*3/uL — ABNORMAL LOW (ref 0.2–1.0)
NEUTROS ABS: 1.9 10*3/uL (ref 1.4–6.5)
Neutrophils Relative %: 83 %
Platelets: 144 10*3/uL — ABNORMAL LOW (ref 150–440)
RBC: 3.69 MIL/uL — ABNORMAL LOW (ref 4.40–5.90)
RDW: 17.5 % — AB (ref 11.5–14.5)
WBC: 2.3 10*3/uL — ABNORMAL LOW (ref 3.8–10.6)

## 2018-02-27 MED ORDER — SODIUM CHLORIDE 0.9 % IV SOLN
Freq: Once | INTRAVENOUS | Status: AC
  Administered 2018-02-27: 10:00:00 via INTRAVENOUS
  Filled 2018-02-27: qty 1000

## 2018-02-27 MED ORDER — LANSOPRAZOLE 30 MG PO CPDR: 30.0000 mg | DELAYED_RELEASE_CAPSULE | Freq: Every day | ORAL | 4 refills | Status: DC

## 2018-02-27 MED ORDER — DEXAMETHASONE SODIUM PHOSPHATE 10 MG/ML IJ SOLN
10.0000 mg | Freq: Once | INTRAMUSCULAR | Status: AC
  Administered 2018-02-27: 10 mg via INTRAVENOUS
  Filled 2018-02-27: qty 1

## 2018-02-27 MED ORDER — CARBOPLATIN CHEMO INJECTION 450 MG/45ML
192.2000 mg | Freq: Once | INTRAVENOUS | Status: DC
  Filled 2018-02-27: qty 19

## 2018-02-27 MED ORDER — DIPHENHYDRAMINE HCL 50 MG/ML IJ SOLN
50.0000 mg | Freq: Once | INTRAMUSCULAR | Status: AC
  Administered 2018-02-27: 50 mg via INTRAVENOUS
  Filled 2018-02-27: qty 1

## 2018-02-27 MED ORDER — SODIUM CHLORIDE 0.9 % IV SOLN
192.2000 mg | Freq: Once | INTRAVENOUS | Status: AC
  Administered 2018-02-27: 190 mg via INTRAVENOUS
  Filled 2018-02-27: qty 19

## 2018-02-27 MED ORDER — OXYCODONE-ACETAMINOPHEN 5-325 MG PO TABS: 1.0000 | ORAL_TABLET | Freq: Four times a day (QID) | ORAL | 0 refills | Status: DC | PRN

## 2018-02-27 MED ORDER — HEPARIN SOD (PORK) LOCK FLUSH 100 UNIT/ML IV SOLN
500.0000 [IU] | Freq: Once | INTRAVENOUS | Status: AC | PRN
  Administered 2018-02-27: 500 [IU]
  Filled 2018-02-27: qty 5

## 2018-02-27 MED ORDER — PALONOSETRON HCL INJECTION 0.25 MG/5ML
0.2500 mg | Freq: Once | INTRAVENOUS | Status: AC
  Administered 2018-02-27: 0.25 mg via INTRAVENOUS
  Filled 2018-02-27: qty 5

## 2018-02-27 MED ORDER — SODIUM CHLORIDE 0.9 % IV SOLN
45.0000 mg/m2 | Freq: Once | INTRAVENOUS | Status: AC
  Administered 2018-02-27: 96 mg via INTRAVENOUS
  Filled 2018-02-27: qty 16

## 2018-02-27 NOTE — Progress Notes (Signed)
Hematology/Oncology  Follow Up note [facility] [phone] [phone]   Patient Care Team: Albina Billet, MD as PCP - General (Internal Medicine) Albina Billet, MD (Internal Medicine) Bary Castilla Forest Gleason, MD (General Surgery)  REFERRING PROVIDER: Dr.McQueen CHIEF COMPLAINTS/PURPOSE OF CONSULTATION:  Newly diagnosed head and neck cancer.  HISTORY OF PRESENTING ILLNESS:  Ivan Garcia. is a  62 y.o.  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 Tami Ribas. With ENT examination, he was found to have midline tongue base is to come down to 2 Weekly which is 40 mg pedunculated mass, 3cm.  And had CT soft tissue neck which confirmed pronounced lymphadenopathy in the right neck including conglomerate partially necrotic nodal mass spanning level III, 4 and 5, measuring 5.2 x 5.5 x 3.7 cm. Additional pathological nodes and right level 2 about 2.5 cm. There is asymmetry in the right tonsillar region. # Biopsy of the right neck mass and pathology revealed squamous cancer. P16 positive.  Current Cancer Treatment Cisplatin 100 mg/m2 every 3 weeks with concurrent RT which started on 12/31/2017.  S/p one dose of Cisplatin. Cisplatin discontinued due to nephrotoxicity.  Switch to weekly Carboplatin (AUC 2) / Taxol 31mmg/m2    INTERVAL HISTORY Ivan Garcia. is a 62 y.o. male  presents for assessment prior to cycle 5 weekly carbo/taxol. Last dose was 02/06/18 due to radiation dermatitis. Continues to get 1 Liter hydration with treatment due to poor oral intake. Ritta Slot has completely resolved with IV Diflucan. He complains of continued swallowing difficulty but this is better given his thrush has cleared. His weight remains stable. He is able to eat soft foods including yogurt. He has been drinking 4-5 Ensures daily. He continues to use Magic mouthwash PRN. Completed first round of  radiation yesterday. He had several weeks off of radiation and chemo due to severe Naveah Brave to neck. Skin has almost completely healed. He continues to use Silvadene cream as needed. Patient is requesting a refill on his Prevacid and pain medication.   Review of Systems  Constitutional: Negative.  Negative for chills, fever, malaise/fatigue and weight loss.  HENT: Positive for sore throat. Negative for congestion and ear pain.   Eyes: Negative.  Negative for blurred vision and double vision.  Respiratory: Negative.  Negative for cough, sputum production and shortness of breath.   Cardiovascular: Negative.  Negative for chest pain, palpitations and leg swelling.  Gastrointestinal: Negative.  Negative for abdominal pain, constipation, diarrhea, nausea and vomiting.  Genitourinary: Negative for dysuria, frequency and urgency.  Musculoskeletal: Negative for back pain and falls.  Skin: Positive for rash.       Bilateral neck: much better per patient. No "sores"  Neurological: Negative.  Negative for weakness and headaches.  Endo/Heme/Allergies: Negative.  Does not bruise/bleed easily.  Psychiatric/Behavioral: Negative.  Negative for depression. The patient is not nervous/anxious and does not have insomnia.     MEDICAL HISTORY:  Past Medical History:  Diagnosis Date  . Arthritis   . Cancer ([place])    Head and neck cancer  . Hemorrhoids   . Hyperlipidemia   . Hypertension     SURGICAL HISTORY: Past Surgical History:  Procedure Laterality Date  . COLONOSCOPY WITH PROPOFOL N/A 04/04/2017   Procedure: COLONOSCOPY WITH PROPOFOL;  Surgeon: Robert Bellow, MD;  [facility] ENDOSCOPY;  Service: Endoscopy;  Laterality: N/A;  . MANDIBLE SURGERY    . PORTA  CATH INSERTION N/A 12/19/2017   Procedure: PORTA CATH INSERTION;  Surgeon: Algernon Huxley, MD;  Location: [place] [facility] LAB;  Service: Cardiovascular;  Laterality: N/A;  . TONSILLECTOMY      SOCIAL HISTORY: Social History   Socioeconomic  History  . Marital status: Single    Spouse name: Not on file  . Number of children: Not on file  . Years of education: Not on file  . Highest education level: Not on file  Occupational History  . Not on file  Social Needs  . Financial resource strain: Not on file  . Food insecurity:    Worry: Not on file    Inability: Not on file  . Transportation needs:    Medical: Not on file    Non-medical: Not on file  Tobacco Use  . Smoking status: Never Smoker  . Smokeless tobacco: Never Used  Substance and Sexual Activity  . Alcohol use: No  . Drug use: No  . Sexual activity: Not on file  Lifestyle  . Physical activity:    Days per week: Not on file    Minutes per session: Not on file  . Stress: Not on file  Relationships  . Social connections:    Talks on phone: Not on file    Gets together: Not on file    Attends religious service: Not on file    Active member of club or organization: Not on file    Attends meetings of clubs or organizations: Not on file    Relationship status: Not on file  . Intimate partner violence:    Fear of current or ex partner: Not on file    Emotionally abused: Not on file    Physically abused: Not on file    Forced sexual activity: Not on file  Other Topics Concern  . Not on file  Social History Narrative  . Not on file    FAMILY HISTORY: Family History  Problem Relation Age of Onset  . Breast cancer Mother   . Asthma Mother   . Congestive Heart Failure Mother   . Prostate cancer Father   . Brain cancer Father   . Bladder Cancer Father     ALLERGIES:  has No Known Allergies.  MEDICATIONS:  Current Outpatient Medications  Medication Sig Dispense Refill  . amLODipine (NORVASC) 2.5 MG tablet Take 1 tablet (2.5 mg total) by mouth daily. 30 tablet 0  . dexamethasone (DECADRON) 4 MG tablet Take 1 tablet (4 mg total) by mouth 2 (two) times daily with a meal. 30 tablet 0  . dexamethasone (DECADRON) 4 MG tablet Take 1 tablet (4 mg total) by  mouth daily. 1 tablet daily x5 days then 1/2 tablet daily until finished. 10 tablet 0  . lansoprazole (PREVACID) 30 MG capsule Take 1 capsule (30 mg total) by mouth daily at 12 noon. 30 capsule 4  . lidocaine (XYLOCAINE) 2 % solution Use as directed 20 mLs in the mouth or throat every 4 (four) hours as needed for mouth pain (throat pain). 300 mL 0  . magic mouthwash SOLN Take 5 mLs by mouth 3 (three) times daily. 100 mL 0  . Multiple Vitamins-Minerals (MULTIVITAMIN WITH MINERALS) tablet Take 1 tablet by mouth daily.    Marland Kitchen oxyCODONE-acetaminophen (PERCOCET/ROXICET) 5-325 MG tablet Take 1 tablet by mouth every 6 (six) hours as needed for severe pain. 56 tablet 0  . rosuvastatin (CRESTOR) 10 MG tablet Take 10 mg by mouth every evening.  4  .  silver sulfADIAZINE (SILVADENE) 1 % cream Apply 1 application topically 2 (two) times daily. 50 g 2  . sucralfate (CARAFATE) 1 g tablet Take 1 tablet (1 g total) by mouth 3 (three) times daily before meals. 90 tablet 5   No current facility-administered medications for this visit.    Facility-Administered Medications Ordered in Other Visits  Medication Dose Route Frequency Provider Last Rate Last Dose  . CARBOplatin (PARAPLATIN) 190 mg in sodium chloride 0.9 % 100 mL chemo infusion  190 mg Intravenous Once Lloyd Huger, MD      . heparin lock flush 100 unit/mL  500 Units Intracatheter Once PRN Lloyd Huger, MD      . PACLitaxel (TAXOL) 96 mg in sodium chloride 0.9 % 250 mL chemo infusion (</= 80mg /m2)  45 mg/m2 (Treatment Plan Recorded) Intravenous Once Lloyd Huger, MD         PHYSICAL EXAMINATION: ECOG PERFORMANCE STATUS: 1 - Symptomatic but completely ambulatory Vitals:   02/27/18 0832  BP: 125/85  Pulse: (!) 106  Resp: 18  Temp: 97.7 F (36.5 C)  SpO2: 98%   Filed Weights   02/27/18 0832  Weight: 202 lb (91.6 kg)    Physical Exam  Constitutional: He is oriented to person, place, and time and well-developed,  well-nourished, and in no distress. Vital signs are normal.  HENT:  Head: Normocephalic and atraumatic.  Eyes: Pupils are equal, round, and reactive to light.  Neck: Normal range of motion.  Cardiovascular: Normal rate, regular rhythm and normal heart sounds.  No murmur heard. Pulmonary/Chest: Effort normal and breath sounds normal. He has no wheezes.  Abdominal: Soft. Normal appearance and bowel sounds are normal. He exhibits no distension. There is no tenderness.  Musculoskeletal: Normal range of motion. He exhibits no edema.  Lymphadenopathy:    He has cervical adenopathy.  Neurological: He is alert and oriented to person, place, and time. Gait normal.  Skin: Skin is warm and dry. Rash noted. There is erythema.     Psychiatric: Mood, memory, affect and judgment normal.     LABORATORY DATA:  I have reviewed the data as listed Lab Results  Component Value Date   WBC 2.3 (L) 02/27/2018   HGB 11.5 (L) 02/27/2018   HCT 33.5 (L) 02/27/2018   MCV 90.8 02/27/2018   PLT 144 (L) 02/27/2018   Recent Labs    02/04/18 1050 02/06/18 0833 02/27/18 0817  NA 138 137 138  K 4.2 4.1 3.8  CL 105 106 103  CO2 25 22 25   GLUCOSE 123* 121* 111*  BUN 31* 41* 50*  CREATININE 1.74* 1.83* 1.47*  CALCIUM 9.1 9.3 9.0  GFRNONAA 40* 38* 49*  GFRAA 47* 44* 57*  PROT 7.2 7.1 6.3*  ALBUMIN 3.3* 3.3* 3.1*  AST 14* 21 23  ALT 13* 16* 39  ALKPHOS 71 64 65  BILITOT 0.6 0.6 0.7     RADIOGRAPHIC STUDIES: I have personally reviewed the radiological images as listed and agreed with the findings in the report. PET scan 12/13/2017 1. Primary hypermetabolic mucosal lesion in the left oropharynx/tongue base with right-sided bulky multilevel lymphadenopathy. 2. No findings for metastatic disease involving the chest, abdomen, pelvis or bony structures.  ASSESSMENT & PLAN:  Clinically he has cT2 cN2 disease, stage II.  1. 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[place])   2. Sore throat   3. Tachycardia      1. Proceed with cycle 5 of carbo/taxol today. Labs look good today. Kidney function improving.  2. Poor oral intake: Give 1 L normal saline with treatment today. Continue ensure supplements as suggested by dietary. 3. Odynophagia: from radiation. Continue Magic mouthwash when necessary. Refilled pain medication and Prevacid today. Checked narcotic registry and patient last filled [place] on 12/14/2017. He was given a 14 day supply. This refill is appropriate at this time. 4. Thrush:Resolved. 5. Radiation dermatitis: Much improved per patient. No open lesions present. Continue Silvadene cream. 6. RTC weekly for Carbo/Taxol.  7. Scheduled to restart radiation for 3 additional weeks on 03/11/18.  8. PET scan tomorrow.  Greater than 50% was spent in counseling and coordination of care with this patient including but not limited to discussion of the relevant topics above (See A&P) including, but not limited to diagnosis and management of acute and chronic medical conditions.   Faythe Casa, NP 02/27/2018 9:53 AM  02/27/2018

## 2018-02-27 NOTE — Progress Notes (Signed)
Patient here for follow up with labs today and treatment with Carbo/Taxol. He states that he finished his first round of radiation yesterday and has been battling with severe sore throat. He has apparent radiation burns on his neck. He states that the carafate does very little to help with the pain and he is only able to eat soft foods. He is requesting a refill of his pain medicine today. He is otherwise doing well. He is scheduled for a PET Scan tomorrow.

## 2018-02-27 NOTE — Progress Notes (Signed)
Previous carboplatin dose was 160 mg based on Scr of 1.8. Creatinine has improved therefore new dose calculated to be 190 mg. > 10% of previous dose therefore dose was changed to 190 mg of carbo.  Larene Beach, PharmD

## 2018-02-27 NOTE — Telephone Encounter (Signed)
Called and made patient aware of changes that was made  regarding his PET scan. Patient is aware of the Location, Date and Time of his PET scan appt. on 03/07/18

## 2018-03-04 ENCOUNTER — Ambulatory Visit: Payer: BLUE CROSS/BLUE SHIELD | Admitting: Radiation Oncology

## 2018-03-04 ENCOUNTER — Other Ambulatory Visit: Payer: BLUE CROSS/BLUE SHIELD

## 2018-03-06 ENCOUNTER — Inpatient Hospital Stay: Payer: BLUE CROSS/BLUE SHIELD | Attending: Oncology

## 2018-03-06 ENCOUNTER — Inpatient Hospital Stay (HOSPITAL_BASED_OUTPATIENT_CLINIC_OR_DEPARTMENT_OTHER): Payer: BLUE CROSS/BLUE SHIELD | Admitting: Oncology

## 2018-03-06 ENCOUNTER — Inpatient Hospital Stay: Payer: BLUE CROSS/BLUE SHIELD

## 2018-03-06 ENCOUNTER — Other Ambulatory Visit: Payer: Self-pay | Admitting: Oncology

## 2018-03-06 ENCOUNTER — Telehealth: Payer: Self-pay | Admitting: Oncology

## 2018-03-06 DIAGNOSIS — C109 Malignant neoplasm of oropharynx, unspecified: Secondary | ICD-10-CM | POA: Diagnosis present

## 2018-03-06 DIAGNOSIS — Z5111 Encounter for antineoplastic chemotherapy: Secondary | ICD-10-CM | POA: Diagnosis present

## 2018-03-06 DIAGNOSIS — R600 Localized edema: Secondary | ICD-10-CM | POA: Diagnosis not present

## 2018-03-06 DIAGNOSIS — R1310 Dysphagia, unspecified: Secondary | ICD-10-CM | POA: Insufficient documentation

## 2018-03-06 DIAGNOSIS — I129 Hypertensive chronic kidney disease with stage 1 through stage 4 chronic kidney disease, or unspecified chronic kidney disease: Secondary | ICD-10-CM | POA: Insufficient documentation

## 2018-03-06 DIAGNOSIS — T451X5A Adverse effect of antineoplastic and immunosuppressive drugs, initial encounter: Secondary | ICD-10-CM | POA: Insufficient documentation

## 2018-03-06 DIAGNOSIS — D701 Agranulocytosis secondary to cancer chemotherapy: Secondary | ICD-10-CM | POA: Insufficient documentation

## 2018-03-06 DIAGNOSIS — A46 Erysipelas: Secondary | ICD-10-CM | POA: Insufficient documentation

## 2018-03-06 DIAGNOSIS — G893 Neoplasm related pain (acute) (chronic): Secondary | ICD-10-CM | POA: Diagnosis not present

## 2018-03-06 DIAGNOSIS — N189 Chronic kidney disease, unspecified: Secondary | ICD-10-CM | POA: Insufficient documentation

## 2018-03-06 DIAGNOSIS — D702 Other drug-induced agranulocytosis: Secondary | ICD-10-CM

## 2018-03-06 LAB — COMPREHENSIVE METABOLIC PANEL
ALK PHOS: 58 U/L (ref 38–126)
ALT: 28 U/L (ref 17–63)
AST: 20 U/L (ref 15–41)
Albumin: 3.1 g/dL — ABNORMAL LOW (ref 3.5–5.0)
Anion gap: 8 (ref 5–15)
BUN: 39 mg/dL — ABNORMAL HIGH (ref 6–20)
CALCIUM: 8.9 mg/dL (ref 8.9–10.3)
CO2: 26 mmol/L (ref 22–32)
CREATININE: 1.45 mg/dL — AB (ref 0.61–1.24)
Chloride: 105 mmol/L (ref 101–111)
GFR, EST AFRICAN AMERICAN: 58 mL/min — AB (ref >60)
GFR, EST NON AFRICAN AMERICAN: 50 mL/min — AB (ref >60)
Glucose, Bld: 117 mg/dL — ABNORMAL HIGH (ref 65–99)
Potassium: 4 mmol/L (ref 3.5–5.1)
Sodium: 139 mmol/L (ref 135–145)
Total Bilirubin: 0.4 mg/dL (ref 0.3–1.2)
Total Protein: 5.9 g/dL — ABNORMAL LOW (ref 6.5–8.1)

## 2018-03-06 LAB — CBC WITH DIFFERENTIAL/PLATELET
BASOS PCT: 1 %
Basophils Absolute: 0 10*3/uL (ref 0–0.1)
EOS ABS: 0 10*3/uL (ref 0–0.7)
Eosinophils Relative: 1 %
HEMATOCRIT: 29.1 % — AB (ref 40.0–52.0)
Hemoglobin: 10.3 g/dL — ABNORMAL LOW (ref 13.0–18.0)
Lymphocytes Relative: 15 %
Lymphs Abs: 0.2 10*3/uL — ABNORMAL LOW (ref 1.0–3.6)
MCH: 32.4 pg (ref 26.0–34.0)
MCHC: 35.3 g/dL (ref 32.0–36.0)
MCV: 91.8 fL (ref 80.0–100.0)
MONO ABS: 0.1 10*3/uL — AB (ref 0.2–1.0)
Monocytes Relative: 7 %
NEUTROS PCT: 76 %
Neutro Abs: 0.7 10*3/uL — ABNORMAL LOW (ref 1.4–6.5)
Platelets: 187 10*3/uL (ref 150–440)
RBC: 3.17 MIL/uL — ABNORMAL LOW (ref 4.40–5.90)
RDW: 18.6 % — AB (ref 11.5–14.5)
Smear Review: ADEQUATE
WBC: 1 10*3/uL — CL (ref 3.8–10.6)

## 2018-03-06 MED ORDER — CIPROFLOXACIN HCL 250 MG PO TABS: 250.0000 mg | ORAL_TABLET | Freq: Every day | ORAL | 0 refills | Status: DC

## 2018-03-06 MED ORDER — SODIUM CHLORIDE 0.9% FLUSH
10.0000 mL | INTRAVENOUS | Status: DC | PRN
  Administered 2018-03-06: 10 mL via INTRAVENOUS
  Filled 2018-03-06: qty 10

## 2018-03-06 MED ORDER — HEPARIN SOD (PORK) LOCK FLUSH 100 UNIT/ML IV SOLN
500.0000 [IU] | Freq: Once | INTRAVENOUS | Status: AC
  Administered 2018-03-06: 500 [IU] via INTRAVENOUS
  Filled 2018-03-06: qty 5

## 2018-03-06 MED ORDER — HEPARIN SOD (PORK) LOCK FLUSH 100 UNIT/ML IV SOLN: 500.0000 [IU] | Freq: Once | INTRAVENOUS | Status: DC

## 2018-03-06 NOTE — Telephone Encounter (Signed)
Cancel all Port Labs/MD/Chemo. per Julie/verbal as per patient has COMPLETED CHEMO.

## 2018-03-06 NOTE — Progress Notes (Signed)
Pt labs outside of parameters, Dr Tasia Catchings notified and at chairside for assessment. Per Dr Tasia Catchings no tx today, will follow up in 1 week for lab/MD/infusion.

## 2018-03-07 ENCOUNTER — Ambulatory Visit: Payer: BLUE CROSS/BLUE SHIELD

## 2018-03-07 ENCOUNTER — Ambulatory Visit
Admission: RE | Admit: 2018-03-07 | Discharge: 2018-03-07 | Disposition: A | Discharge status: Home / Self Care | Payer: BLUE CROSS/BLUE SHIELD | Source: Ambulatory Visit | Attending: Radiation Oncology | Admitting: Radiation Oncology

## 2018-03-07 DIAGNOSIS — C109 Malignant neoplasm of oropharynx, unspecified: Secondary | ICD-10-CM | POA: Diagnosis present

## 2018-03-07 LAB — GLUCOSE, CAPILLARY: GLUCOSE-CAPILLARY: 81 mg/dL (ref 65–99)

## 2018-03-07 MED ORDER — FLUDEOXYGLUCOSE F - 18 (FDG) INJECTION
10.9000 | Freq: Once | INTRAVENOUS | Status: AC | PRN
  Administered 2018-03-07: 10.59 via INTRAVENOUS

## 2018-03-07 NOTE — Progress Notes (Signed)
Nutrition  Called patient for nutrition follow-up and no answer on cell phone.  Left message asking for patient to return call.  Lanaya Bennis [name], [place], [place] Registered Dietitian [phone] (pager)

## 2018-03-08 ENCOUNTER — Encounter
Admission: RE | Admit: 2018-03-08 | Discharge: 2018-03-08 | Disposition: A | Discharge status: Home / Self Care | Payer: BLUE CROSS/BLUE SHIELD | Source: Ambulatory Visit | Attending: Radiation Oncology | Admitting: Radiation Oncology

## 2018-03-08 DIAGNOSIS — C109 Malignant neoplasm of oropharynx, unspecified: Secondary | ICD-10-CM | POA: Diagnosis present

## 2018-03-08 LAB — GLUCOSE, CAPILLARY: GLUCOSE-CAPILLARY: 76 mg/dL (ref 65–99)

## 2018-03-08 IMAGING — CT NM PET TUM IMG RESTAG (PS) SKULL BASE T - THIGH
10 series · 20 of 25 positions shown · non-contrast
Comparison: PET-CT dated [DATE]

CLINICAL DATA: Subsequent treatment strategy for right head neck
cancer.

EXAM:
NUCLEAR MEDICINE PET SKULL BASE TO THIGH
TECHNIQUE: 11.8 mCi F-18 FDG was injected intravenously. Full-ring PET imaging
was performed from the skull base to thigh after the radiotracer. CT
data was obtained and used for attenuation correction and anatomic
localization.
Fasting blood glucose: 76 mg/dl

[Series 3: ct wb 5.0 b30f · axial · 5.0mm · 0.98mm/px · z∈[-1560,-1068]mm · 2 of 329 slices shown]
[im 1/329]
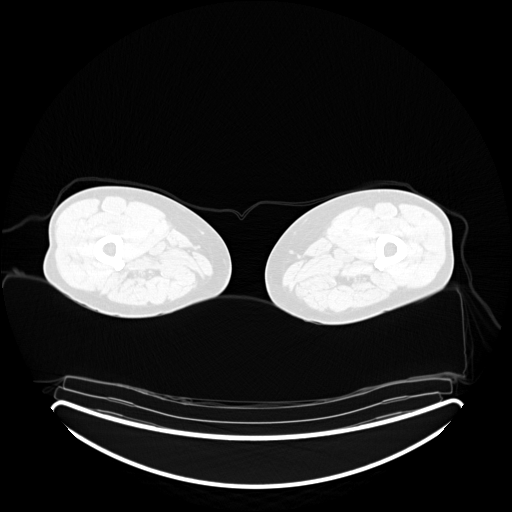
[im 165/329]
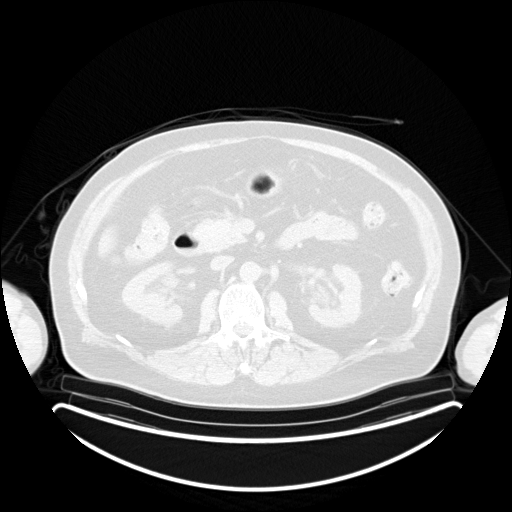

[Series 4: pet wb (ac) · axial · 5.0mm · 3.13mm/px · z∈[-1560,-576]mm · 3 of 329 slices shown]
[im 1/329]
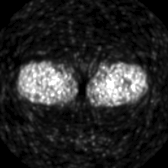
[im 165/329]
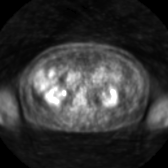
[im 329/329]
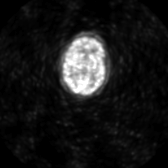

[Series 5: pet wb uncorrected (nac) · axial · 5.0mm · 4.07mm/px · z∈[-1560,-576]mm · 3 of 329 slices shown]
[im 1/329]
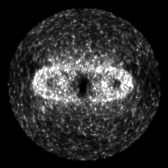
[im 219/329]
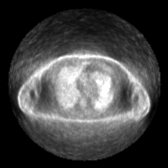
[im 329/329]
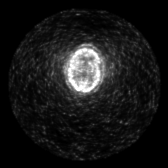

[Series 603: pet axial fused · 3 of 328 slices shown]
[im 1/328]
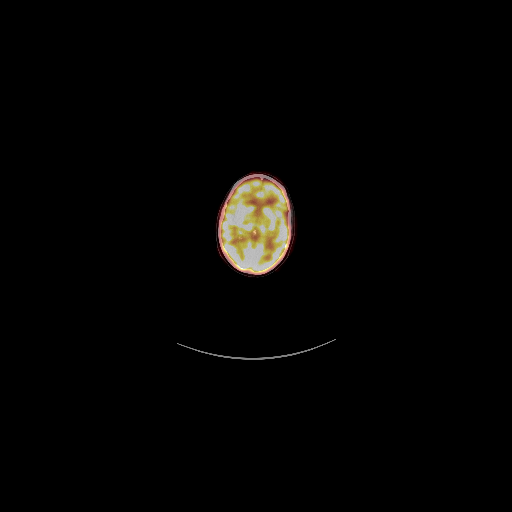
[im 110/328]
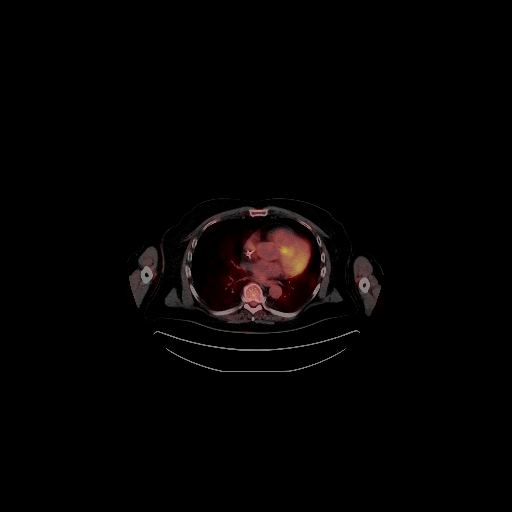
[im 328/328]
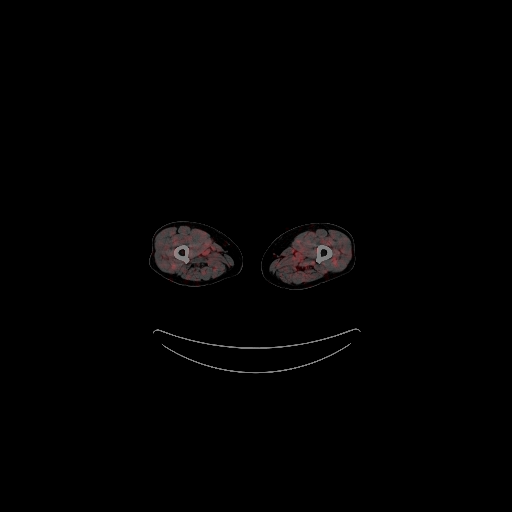

[Series 604: pet coronal fused · 1 of 96 slices shown]
[im 1/96]
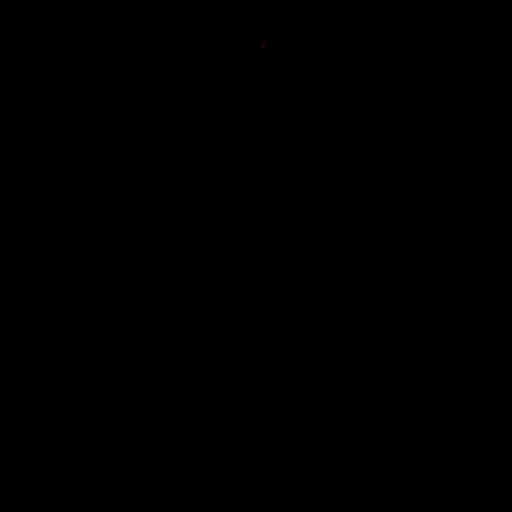

[Series 605: pet sagittal fused · 2 of 161 slices shown]
[im 1/161]
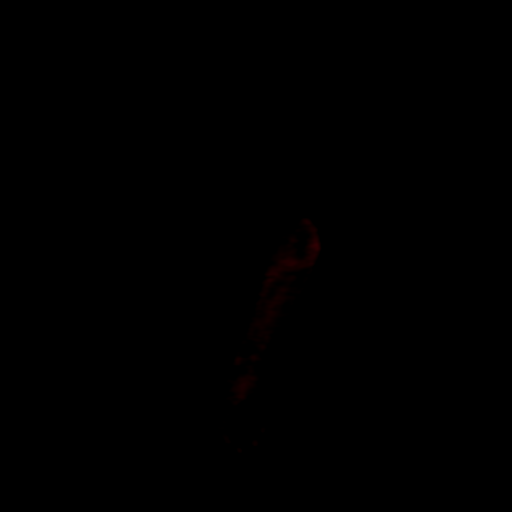
[im 161/161]
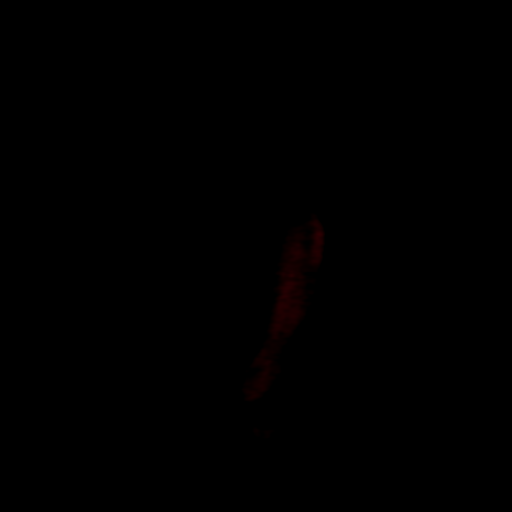

[Series 606: pet axial · 3 of 328 slices shown]
[im 110/328]
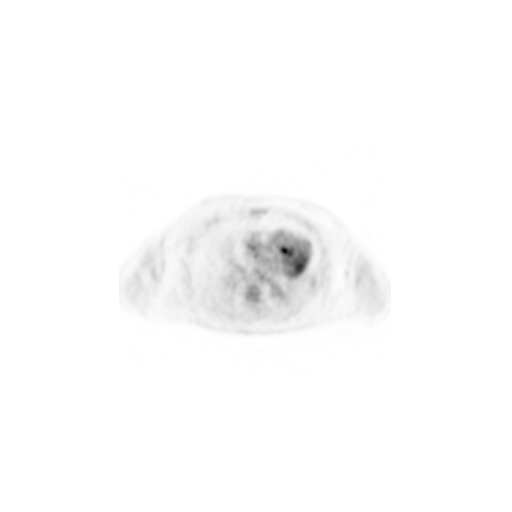
[im 219/328]
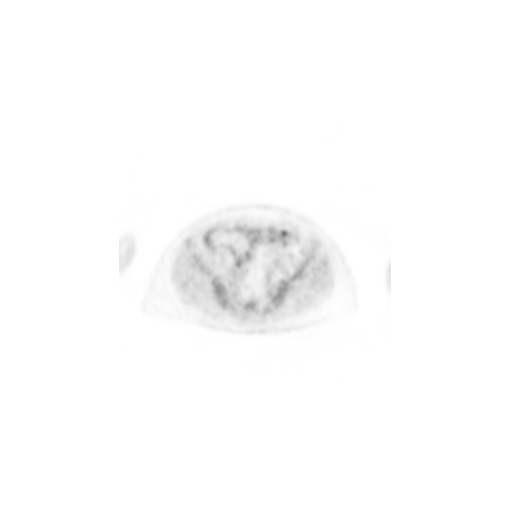
[im 328/328]
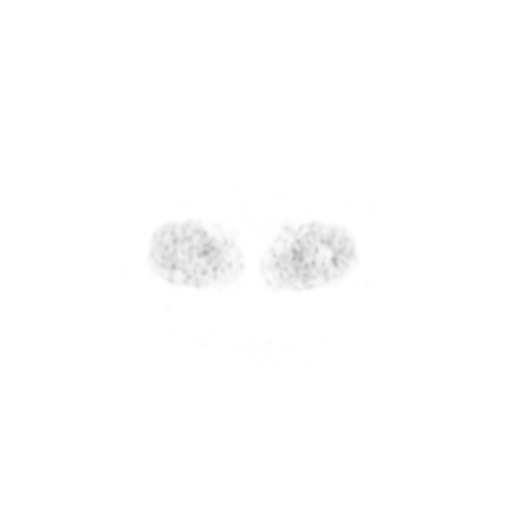

[Series 607: pet coronal · 1 of 124 slices shown]
[im 1/124]
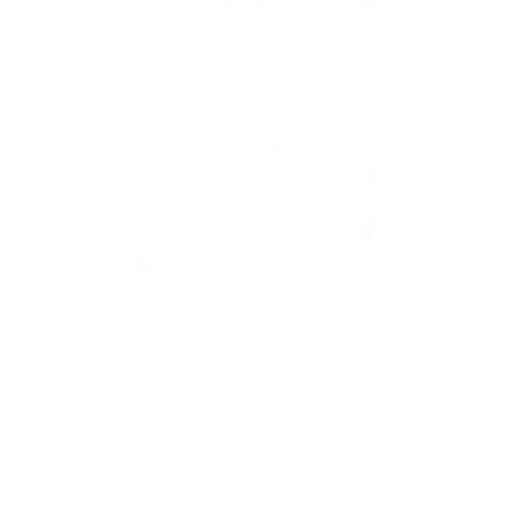

[Series 608: pet sagittal · 1 of 175 slices shown]
[im 175/175]
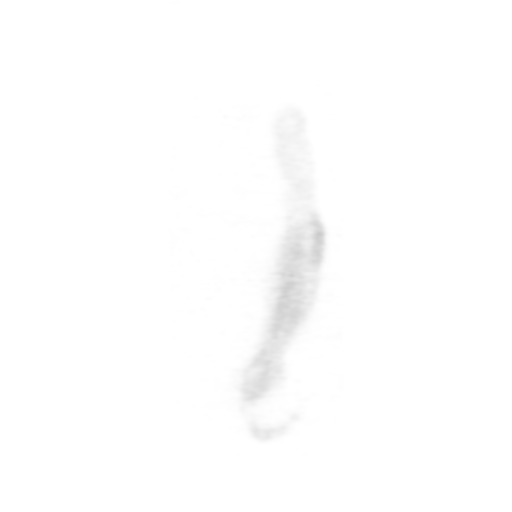

[Series 1118: results mm oncology reading · 1.0mm · 0.50mm/px · 1 of 3 slices shown]
[im 1/3]
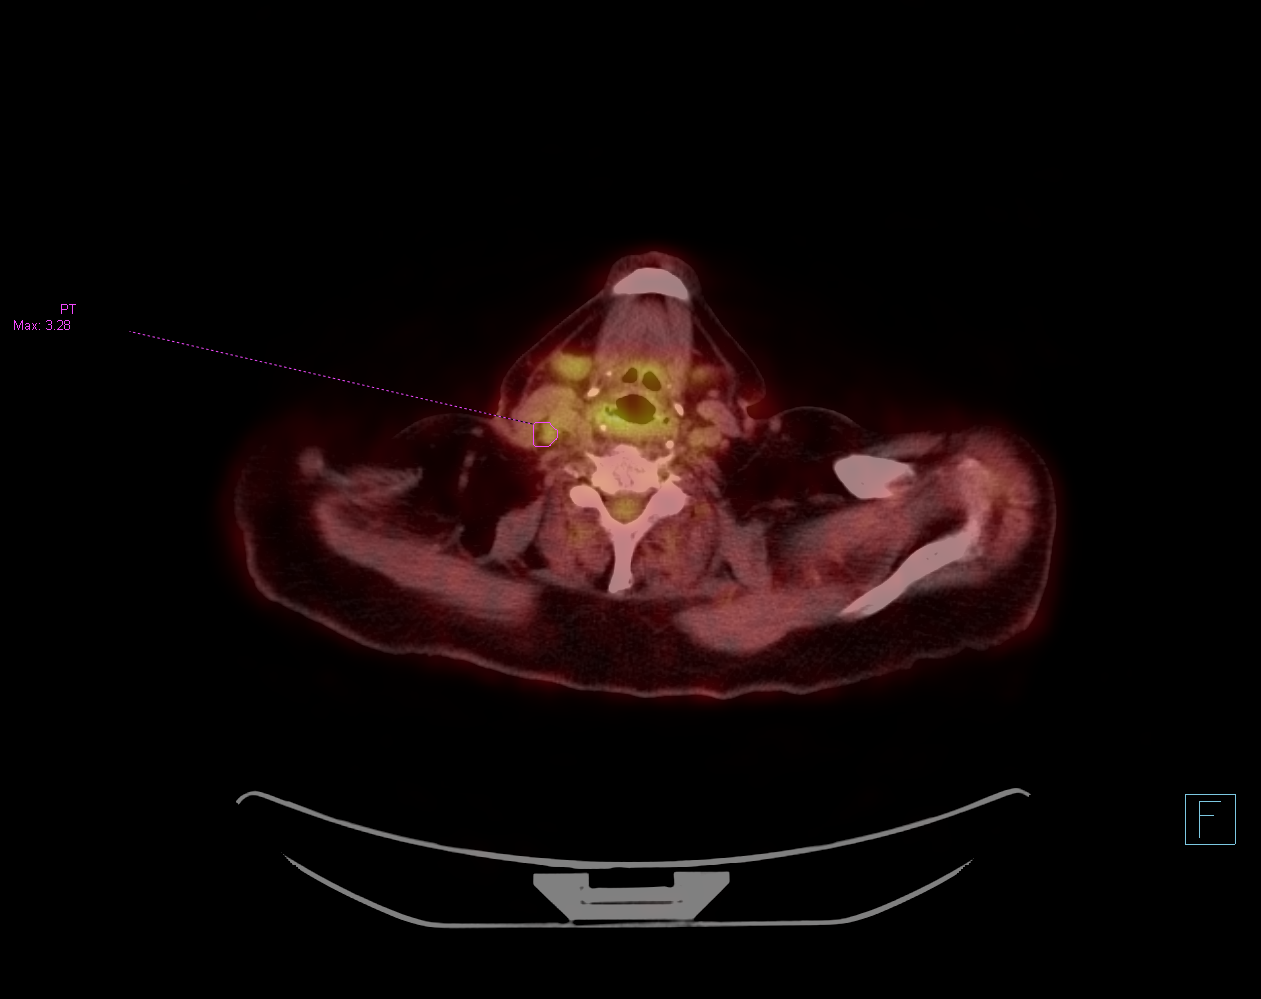

[20 of 25 positions shown; findings below may reference images not displayed]

FINDINGS: Mediastinal blood pool activity: SUV max

NECK: Near complete resolution of prior right neck mass.

Small right cervical nodes measuring up to 8 mm short axis (series
3/image 55), max SUV 3.3.

Additional 10 mm short axis right supraclavicular node (series
3/image 62), max SUV 2.4.

Incidental CT findings: none

CHEST: No hypermetabolic thoracic lymphadenopathy.

No suspicious pulmonary nodules.

Incidental CT findings: Mild coronary atherosclerosis the LAD and
right coronary artery. Left chest port terminating at the cavoatrial
junction. Mild lingular scarring/atelectasis.

ABDOMEN/PELVIS: No hypermetabolic lymphadenopathy in the
abdomen/pelvis.

No abnormal hypermetabolism in the liver, spleen, pancreas, and
adrenal glands.

Incidental CT findings: 15 mm cyst in the left liver (series 3/image
141). 4.6 cm cyst in the medial right upper kidney (series 3/image
148). Left renal sinus cysts. Sigmoid diverticulosis, without
evidence of diverticulitis.

SKELETON: No focal hypermetabolic activity to suggest skeletal
metastasis.

Incidental CT findings: Mild degenerative changes the thoracic
spine.
IMPRESSION: Near complete resolution of prior large right neck mass.

Small right cervical/supraclavicular nodal metastases measuring up
to 10 mm short axis.

No evidence of distal metastases.

## 2018-03-08 MED ORDER — FLUDEOXYGLUCOSE F - 18 (FDG) INJECTION
10.9000 | Freq: Once | INTRAVENOUS | Status: AC | PRN
  Administered 2018-03-08: 11.83 via INTRAVENOUS

## 2018-03-10 ENCOUNTER — Encounter: Payer: Self-pay | Admitting: Oncology

## 2018-03-10 NOTE — Progress Notes (Signed)
Hematology/Oncology  Follow Up note [facility] Telephone:(336) [phone] Fax:(336) [phone]   Patient Care Team: Albina Billet, MD as PCP - General (Internal Medicine) Albina Billet, MD (Internal Medicine) Bary Castilla Forest Gleason, MD (General Surgery)  REFERRING PROVIDER: Dr.McQueen CHIEF COMPLAINTS/PURPOSE OF CONSULTATION:  Evaluation of newly diagnosed head and neck cancer.  HISTORY OF PRESENTING ILLNESS:  Ivan Garcia. is a  62 y.o.  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 Tami Ribas. With ENT examination, he was found to have midline tongue base is to come down to 2 Weekly which is 40 mg pedunculated mass, 3cm.  And had CT soft tissue neck which confirmed pronounced lymphadenopathy in the right neck including conglomerate partially necrotic nodal mass spanning level III, 4 and 5, measuring 5.2 x 5.5 x 3.7 cm. Additional pathological nodes and right level 2 about 2.5 cm. There is asymmetry in the right tonsillar region. # Biopsy of the right neck mass and pathology revealed squamous cancer. P16 positive.  Current Cancer Treatment Cisplatin 100 mg/m2 every 3 weeks with concurrent RT which started on 12/31/2017.  S/p one dose of Cisplatin. Cisplatin discontinued due to nephrotoxicity.  Switch to weekly Carboplatin (AUC 2) / Taxol 52mmg/m2    INTERVAL HISTORY Chasyn Cinque Vasiliy Mccarry. is a 62 y.o. male who presents to the infusion center for couple attacks of treatment.  He was not initially scheduled to see me.  I was notified by infusion nurse, and I add patient to my today's schedule. Patient reports that he is currently not on radiation.  He is about to have a repeat PET scan to determine whether additional radiation is desired. His odynophagia has improved, his oral intake has much improved.  Denies fever or chills, cough, sore throat, abdominal pain or chest pain.  Review of Systems    Constitutional: Negative for chills, diaphoresis, fever and malaise/fatigue.  HENT: Negative for congestion, ear discharge, ear pain, hearing loss, sore throat and tinnitus.        Sore throat  Eyes: Negative for photophobia, pain, discharge and redness.  Respiratory: Negative for cough, sputum production and shortness of breath.   Cardiovascular: Negative for chest pain, orthopnea and leg swelling.  Gastrointestinal: Negative for constipation, diarrhea, heartburn, melena, nausea and vomiting.  Genitourinary: Negative for dysuria, frequency, hematuria and urgency.  Musculoskeletal: Negative for myalgias and neck pain.  Skin: Negative for rash.  Neurological: Negative for dizziness, tremors and sensory change.  Endo/Heme/Allergies: Negative for polydipsia. Does not bruise/bleed easily.  Psychiatric/Behavioral: Negative for depression, substance abuse and suicidal ideas. The patient is not nervous/anxious.     MEDICAL HISTORY:  Past Medical History:  Diagnosis Date  . Arthritis   . Cancer ([place])    Head and neck cancer  . Hemorrhoids   . Hyperlipidemia   . Hypertension     SURGICAL HISTORY: Past Surgical History:  Procedure Laterality Date  . COLONOSCOPY WITH PROPOFOL N/A 04/04/2017   Procedure: COLONOSCOPY WITH PROPOFOL;  Surgeon: Robert Bellow, MD;  Location: [facility] ENDOSCOPY;  Service: Endoscopy;  Laterality: N/A;  . MANDIBLE SURGERY    . PORTA CATH INSERTION N/A 12/19/2017   Procedure: PORTA CATH INSERTION;  Surgeon: Algernon Huxley, MD;  Location: [facility];  Service: Cardiovascular;  Laterality: N/A;  . TONSILLECTOMY      SOCIAL HISTORY: Social History   Socioeconomic History  . Marital status: Single    Spouse  name: Not on file  . Number of children: Not on file  . Years of education: Not on file  . Highest education level: Not on file  Occupational History  . Not on file  Social Needs  . Financial resource strain: Not on file  . Food insecurity:     Worry: Not on file    Inability: Not on file  . Transportation needs:    Medical: Not on file    Non-medical: Not on file  Tobacco Use  . Smoking status: Never Smoker  . Smokeless tobacco: Never Used  Substance and Sexual Activity  . Alcohol use: No  . Drug use: No  . Sexual activity: Not on file  Lifestyle  . Physical activity:    Days per week: Not on file    Minutes per session: Not on file  . Stress: Not on file  Relationships  . Social connections:    Talks on phone: Not on file    Gets together: Not on file    Attends religious service: Not on file    Active member of club or organization: Not on file    Attends meetings of clubs or organizations: Not on file    Relationship status: Not on file  . Intimate partner violence:    Fear of current or ex partner: Not on file    Emotionally abused: Not on file    Physically abused: Not on file    Forced sexual activity: Not on file  Other Topics Concern  . Not on file  Social History Narrative  . Not on file    FAMILY HISTORY: Family History  Problem Relation Age of Onset  . Breast cancer Mother   . Asthma Mother   . Congestive Heart Failure Mother   . Prostate cancer Father   . Brain cancer Father   . Bladder Cancer Father     ALLERGIES:  has No Known Allergies.  MEDICATIONS:  Current Outpatient Medications  Medication Sig Dispense Refill  . amLODipine (NORVASC) 2.5 MG tablet Take 1 tablet (2.5 mg total) by mouth daily. 30 tablet 0  . ciprofloxacin (CIPRO) 250 MG tablet Take 1 tablet (250 mg total) by mouth daily. 10 tablet 0  . dexamethasone (DECADRON) 4 MG tablet Take 1 tablet (4 mg total) by mouth 2 (two) times daily with a meal. 30 tablet 0  . dexamethasone (DECADRON) 4 MG tablet Take 1 tablet (4 mg total) by mouth daily. 1 tablet daily x5 days then 1/2 tablet daily until finished. 10 tablet 0  . lansoprazole (PREVACID) 30 MG capsule Take 1 capsule (30 mg total) by mouth daily at 12 noon. 30 capsule 4  .  lidocaine (XYLOCAINE) 2 % solution Use as directed 20 mLs in the mouth or throat every 4 (four) hours as needed for mouth pain (throat pain). 300 mL 0  . magic mouthwash SOLN Take 5 mLs by mouth 3 (three) times daily. 100 mL 0  . Multiple Vitamins-Minerals (MULTIVITAMIN WITH MINERALS) tablet Take 1 tablet by mouth daily.    Marland Kitchen oxyCODONE-acetaminophen (PERCOCET/ROXICET) 5-325 MG tablet Take 1 tablet by mouth every 6 (six) hours as needed for severe pain. 56 tablet 0  . rosuvastatin (CRESTOR) 10 MG tablet Take 10 mg by mouth every evening.  4  . silver sulfADIAZINE (SILVADENE) 1 % cream Apply 1 application topically 2 (two) times daily. 50 g 2  . sucralfate (CARAFATE) 1 g tablet Take 1 tablet (1 g total) by mouth 3 (three)  times daily before meals. 90 tablet 5   No current facility-administered medications for this visit.      PHYSICAL EXAMINATION: ECOG PERFORMANCE STATUS: 1 - Symptomatic but completely ambulatory Vital signs per chart Physical Exam  Constitutional: He is oriented to person, place, and time. No distress.  HENT:  Head: Normocephalic and atraumatic.  Mouth/Throat: Oropharynx is clear and moist. No oropharyngeal exudate.  thrush  Eyes: Pupils are equal, round, and reactive to light. Conjunctivae and EOM are normal. Left eye exhibits no discharge. No scleral icterus.  Neck: Normal range of motion. Neck supple. No JVD present.  Right side neck mass has significantly decreased in size.Marland Kitchen No thrush  Cardiovascular: Normal rate, regular rhythm and normal heart sounds. Exam reveals no friction rub.  No murmur heard. Pulmonary/Chest: Breath sounds normal. He has no wheezes. He has no rales.  Abdominal: Soft. Bowel sounds are normal. He exhibits no distension. There is no tenderness. There is no rebound and no guarding.  Musculoskeletal: Normal range of motion. He exhibits no edema or tenderness.  Lymphadenopathy:    He has cervical adenopathy.  Neurological: He is alert and oriented  to person, place, and time. No cranial nerve deficit. Gait normal.  Skin: Skin is dry. No rash noted. He is not diaphoretic. No erythema.  2cm x 2cm skin break down on his neck on the right side.      LABORATORY DATA:  I have reviewed the data as listed Lab Results  Component Value Date   WBC 1.0 (LL) 03/06/2018   HGB 10.3 (L) 03/06/2018   HCT 29.1 (L) 03/06/2018   MCV 91.8 03/06/2018   PLT 187 03/06/2018   Recent Labs    02/06/18 0833 02/27/18 0817 03/06/18 0830  NA 137 138 139  K 4.1 3.8 4.0  CL 106 103 105  CO2 22 25 26   GLUCOSE 121* 111* 117*  BUN 41* 50* 39*  CREATININE 1.83* 1.47* 1.45*  CALCIUM 9.3 9.0 8.9  GFRNONAA 38* 49* 50*  GFRAA 44* 57* 58*  PROT 7.1 6.3* 5.9*  ALBUMIN 3.3* 3.1* 3.1*  AST 21 23 20   ALT 16* 39 28  ALKPHOS 64 65 58  BILITOT 0.6 0.7 0.4     RADIOGRAPHIC STUDIES: I have personally reviewed the radiological images as listed and agreed with the findings in the report. PET scan 12/13/2017 1. Primary hypermetabolic mucosal lesion in the left oropharynx/tongue base with right-sided bulky multilevel lymphadenopathy. 2. No findings for metastatic disease involving the chest, abdomen, pelvis or bony structures.  ASSESSMENT & PLAN:  Clinically he has cT2 cN2 disease, stage II.  1. 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[place])   2. Odynophagia   3. Encounter for antineoplastic chemotherapy   4. Other drug-induced neutropenia ([place])    #Hold weekly carbo and Taxol.  Is currently not on radiation. #Encourage oral intake. # Odynophagia, due to RT. Improved. # Neutropenia is secondary to combination of radiation and chemotherapy.  Educated patient that if he spikes fever he need to seek medical advice immediately.  He voices understanding.  We will give him a course of Cipro 250 mg daily for prophylaxis.  Anticipate counts will improved next week.  #Dr. Donella Stade has ordered a PET scan to determine whether patient needs additional radiation.  Continue  monitor. All questions were answered. The patient knows to call the clinic with any problems questions or concerns.  Return of visit: 1 week Earlie Server, MD, PhD Hematology Oncology [place] at [facility]  Pager- [phone] 03/10/2018

## 2018-03-11 ENCOUNTER — Ambulatory Visit: Payer: BLUE CROSS/BLUE SHIELD

## 2018-03-12 ENCOUNTER — Ambulatory Visit: Payer: BLUE CROSS/BLUE SHIELD

## 2018-03-13 ENCOUNTER — Inpatient Hospital Stay: Payer: BLUE CROSS/BLUE SHIELD

## 2018-03-13 ENCOUNTER — Ambulatory Visit: Payer: BLUE CROSS/BLUE SHIELD

## 2018-03-13 ENCOUNTER — Encounter: Payer: Self-pay | Admitting: Oncology

## 2018-03-13 ENCOUNTER — Inpatient Hospital Stay (HOSPITAL_BASED_OUTPATIENT_CLINIC_OR_DEPARTMENT_OTHER): Payer: BLUE CROSS/BLUE SHIELD | Admitting: Oncology

## 2018-03-13 ENCOUNTER — Other Ambulatory Visit: Payer: Self-pay

## 2018-03-13 VITALS — BP 126/81 | HR 83 | Temp 97.6°F | Wt 205.2 lb

## 2018-03-13 DIAGNOSIS — R1310 Dysphagia, unspecified: Secondary | ICD-10-CM | POA: Diagnosis not present

## 2018-03-13 DIAGNOSIS — C109 Malignant neoplasm of oropharynx, unspecified: Secondary | ICD-10-CM

## 2018-03-13 DIAGNOSIS — G893 Neoplasm related pain (acute) (chronic): Secondary | ICD-10-CM | POA: Diagnosis not present

## 2018-03-13 DIAGNOSIS — A46 Erysipelas: Secondary | ICD-10-CM

## 2018-03-13 LAB — CBC WITH DIFFERENTIAL/PLATELET
Basophils Absolute: 0 10*3/uL (ref 0–0.1)
Basophils Relative: 0 %
EOS PCT: 0 %
Eosinophils Absolute: 0 10*3/uL (ref 0–0.7)
HEMATOCRIT: 24.9 % — AB (ref 40.0–52.0)
Hemoglobin: 8.9 g/dL — ABNORMAL LOW (ref 13.0–18.0)
LYMPHS PCT: 10 %
Lymphs Abs: 0.3 10*3/uL — ABNORMAL LOW (ref 1.0–3.6)
MCH: 32.3 pg (ref 26.0–34.0)
MCHC: 35.9 g/dL (ref 32.0–36.0)
MCV: 90.1 fL (ref 80.0–100.0)
MONOS PCT: 12 %
Monocytes Absolute: 0.4 10*3/uL (ref 0.2–1.0)
NEUTROS ABS: 2.3 10*3/uL (ref 1.4–6.5)
Neutrophils Relative %: 78 %
PLATELETS: 225 10*3/uL (ref 150–440)
RBC: 2.76 MIL/uL — ABNORMAL LOW (ref 4.40–5.90)
RDW: 18.3 % — AB (ref 11.5–14.5)
WBC: 2.9 10*3/uL — ABNORMAL LOW (ref 3.8–10.6)

## 2018-03-13 LAB — COMPREHENSIVE METABOLIC PANEL
ALBUMIN: 2.9 g/dL — AB (ref 3.5–5.0)
ALT: 23 U/L (ref 17–63)
AST: 17 U/L (ref 15–41)
Alkaline Phosphatase: 62 U/L (ref 38–126)
Anion gap: 7 (ref 5–15)
BILIRUBIN TOTAL: 0.6 mg/dL (ref 0.3–1.2)
BUN: 27 mg/dL — AB (ref 6–20)
CO2: 25 mmol/L (ref 22–32)
Calcium: 8.9 mg/dL (ref 8.9–10.3)
Chloride: 107 mmol/L (ref 101–111)
Creatinine, Ser: 1.52 mg/dL — ABNORMAL HIGH (ref 0.61–1.24)
GFR calc Af Amer: 55 mL/min — ABNORMAL LOW (ref >60)
GFR calc non Af Amer: 47 mL/min — ABNORMAL LOW (ref >60)
GLUCOSE: 132 mg/dL — AB (ref 65–99)
POTASSIUM: 3.6 mmol/L (ref 3.5–5.1)
SODIUM: 139 mmol/L (ref 135–145)
TOTAL PROTEIN: 6.3 g/dL — AB (ref 6.5–8.1)

## 2018-03-13 MED ORDER — HEPARIN SOD (PORK) LOCK FLUSH 100 UNIT/ML IV SOLN
500.0000 [IU] | Freq: Once | INTRAVENOUS | Status: AC
  Administered 2018-03-13: 500 [IU] via INTRAVENOUS
  Filled 2018-03-13: qty 5

## 2018-03-13 MED ORDER — SODIUM CHLORIDE 0.9% FLUSH
10.0000 mL | Freq: Once | INTRAVENOUS | Status: AC
  Administered 2018-03-13: 10 mL via INTRAVENOUS
  Filled 2018-03-13: qty 10

## 2018-03-13 MED ORDER — CEPHALEXIN 500 MG PO CAPS: 500.0000 mg | ORAL_CAPSULE | Freq: Two times a day (BID) | ORAL | 0 refills | Status: DC

## 2018-03-13 MED ORDER — OXYCODONE-ACETAMINOPHEN 5-325 MG PO TABS: 1.0000 | ORAL_TABLET | Freq: Three times a day (TID) | ORAL | 0 refills | Status: DC | PRN

## 2018-03-13 MED ORDER — CEPHALEXIN 500 MG PO CAPS: 500.0000 mg | ORAL_CAPSULE | Freq: Four times a day (QID) | ORAL | 0 refills | Status: DC

## 2018-03-13 NOTE — Progress Notes (Signed)
Hematology/Oncology  Follow Up note [facility] Telephone:(336) [phone] Fax:(336) [phone]   Patient Care Team: Albina Billet, MD as PCP - General (Internal Medicine) Albina Billet, MD (Internal Medicine) Bary Castilla Forest Gleason, MD (General Surgery)  REFERRING PROVIDER: Dr.McQueen CHIEF COMPLAINTS/PURPOSE OF CONSULTATION:  Evaluation of newly diagnosed head and neck cancer.  HISTORY OF PRESENTING ILLNESS:  Ivan Vivas. is a  63 y.o.  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 Tami Ribas. With ENT examination, he was found to have midline tongue base is to come down to 2 Weekly which is 40 mg pedunculated mass, 3cm.  And had CT soft tissue neck which confirmed pronounced lymphadenopathy in the right neck including conglomerate partially necrotic nodal mass spanning level III, 4 and 5, measuring 5.2 x 5.5 x 3.7 cm. Additional pathological nodes and right level 2 about 2.5 cm. There is asymmetry in the right tonsillar region. # Biopsy of the right neck mass and pathology revealed squamous cancer. P16 positive.  Current Cancer Treatment Cisplatin 100 mg/m2 every 3 weeks with concurrent RT which started on 12/31/2017.  S/p one dose of Cisplatin. Cisplatin discontinued due to nephrotoxicity.  Switch to weekly Carboplatin (AUC 2) / Taxol 7mmg/m2    INTERVAL HISTORY Ivan Garcia. is a 62 y.o. male who presents for follow up of head and neck cancer.  He just had PET scan done and result is pending.  Still has odynophagia, pain gets better after taking pain medication, Uses 2-3 percocet's/ day. Weight is stable.  Denies fever, chills, nausea vomiting. Noticed pruritic bilateral shin rash, no swelling or calf tenderness.   Review of Systems  Constitutional: Negative for chills, diaphoresis, fever and malaise/fatigue.  HENT: Negative for congestion, ear discharge, ear pain, hearing loss, sore  throat and tinnitus.        Sore throat  Eyes: Negative for photophobia, pain, discharge and redness.  Respiratory: Negative for cough, sputum production and shortness of breath.   Cardiovascular: Negative for chest pain, orthopnea and leg swelling.  Gastrointestinal: Negative for constipation, diarrhea, heartburn, melena, nausea and vomiting.  Genitourinary: Negative for dysuria, frequency, hematuria and urgency.  Musculoskeletal: Negative for myalgias and neck pain.  Skin: Positive for rash.  Neurological: Negative for dizziness, tremors and sensory change.  Endo/Heme/Allergies: Negative for polydipsia. Does not bruise/bleed easily.  Psychiatric/Behavioral: Negative for depression, substance abuse and suicidal ideas. The patient is not nervous/anxious.     MEDICAL HISTORY:  Past Medical History:  Diagnosis Date  . Arthritis   . Cancer ([place])    Head and neck cancer  . Hemorrhoids   . Hyperlipidemia   . Hypertension     SURGICAL HISTORY: Past Surgical History:  Procedure Laterality Date  . COLONOSCOPY WITH PROPOFOL N/A 04/04/2017   Procedure: COLONOSCOPY WITH PROPOFOL;  Surgeon: Robert Bellow, MD;  Location: [facility] ENDOSCOPY;  Service: Endoscopy;  Laterality: N/A;  . MANDIBLE SURGERY    . PORTA CATH INSERTION N/A 12/19/2017   Procedure: PORTA CATH INSERTION;  Surgeon: Algernon Huxley, MD;  Location: [facility];  Service: Cardiovascular;  Laterality: N/A;  . TONSILLECTOMY      SOCIAL HISTORY: Social History   Socioeconomic History  . Marital status: Single    Spouse name: Not on file  . Number of children: Not on file  . Years of education: Not on file  . Highest education level: Not on file  Occupational History  . Not on file  Social Needs  . Financial resource strain: Not on file  . Food insecurity:    Worry: Not on file    Inability: Not on file  . Transportation needs:    Medical: Not on file    Non-medical: Not on file  Tobacco Use  . Smoking status:  Never Smoker  . Smokeless tobacco: Never Used  Substance and Sexual Activity  . Alcohol use: No  . Drug use: No  . Sexual activity: Not on file  Lifestyle  . Physical activity:    Days per week: Not on file    Minutes per session: Not on file  . Stress: Not on file  Relationships  . Social connections:    Talks on phone: Not on file    Gets together: Not on file    Attends religious service: Not on file    Active member of club or organization: Not on file    Attends meetings of clubs or organizations: Not on file    Relationship status: Not on file  . Intimate partner violence:    Fear of current or ex partner: Not on file    Emotionally abused: Not on file    Physically abused: Not on file    Forced sexual activity: Not on file  Other Topics Concern  . Not on file  Social History Narrative  . Not on file    FAMILY HISTORY: Family History  Problem Relation Age of Onset  . Breast cancer Mother   . Asthma Mother   . Congestive Heart Failure Mother   . Prostate cancer Father   . Brain cancer Father   . Bladder Cancer Father     ALLERGIES:  has No Known Allergies.  MEDICATIONS:  Current Outpatient Medications  Medication Sig Dispense Refill  . amLODipine (NORVASC) 2.5 MG tablet Take 1 tablet (2.5 mg total) by mouth daily. 30 tablet 0  . dexamethasone (DECADRON) 4 MG tablet Take 1 tablet (4 mg total) by mouth 2 (two) times daily with a meal. 30 tablet 0  . dexamethasone (DECADRON) 4 MG tablet Take 1 tablet (4 mg total) by mouth daily. 1 tablet daily x5 days then 1/2 tablet daily until finished. 10 tablet 0  . lansoprazole (PREVACID) 30 MG capsule Take 1 capsule (30 mg total) by mouth daily at 12 noon. 30 capsule 4  . lidocaine (XYLOCAINE) 2 % solution Use as directed 20 mLs in the mouth or throat every 4 (four) hours as needed for mouth pain (throat pain). 300 mL 0  . Multiple Vitamins-Minerals (MULTIVITAMIN WITH MINERALS) tablet Take 1 tablet by mouth daily.    Marland Kitchen  oxyCODONE-acetaminophen (PERCOCET/ROXICET) 5-325 MG tablet Take 1 tablet by mouth every 8 (eight) hours as needed for severe pain. 42 tablet 0  . rosuvastatin (CRESTOR) 10 MG tablet Take 10 mg by mouth every evening.  4  . silver sulfADIAZINE (SILVADENE) 1 % cream Apply 1 application topically 2 (two) times daily. 50 g 2  . sucralfate (CARAFATE) 1 g tablet Take 1 tablet (1 g total) by mouth 3 (three) times daily before meals. 90 tablet 5  . cephALEXin (KEFLEX) 500 MG capsule Take 1 capsule (500 mg total) by mouth 2 (two) times daily. 10 capsule 0  . magic mouthwash SOLN Take 5 mLs by mouth 3 (three) times daily. (Patient not taking: Reported on 03/13/2018) 100 mL 0   No current facility-administered medications for this visit.  PHYSICAL EXAMINATION: ECOG PERFORMANCE STATUS: 1 - Symptomatic but completely ambulatory Vitals:   03/13/18 1155  BP: 126/81  Pulse: 83  Temp: 97.6 F (36.4 C)   Physical Exam  Constitutional: He is oriented to person, place, and time. No distress.  HENT:  Head: Normocephalic and atraumatic.  Mouth/Throat: Oropharynx is clear and moist. No oropharyngeal exudate.  thrush  Eyes: Pupils are equal, round, and reactive to light. Conjunctivae and EOM are normal. Left eye exhibits no discharge. No scleral icterus.  Neck: Normal range of motion. Neck supple.  Right side neck mass has significantly decreased in size.Marland Kitchen No thrush  Cardiovascular: Normal rate, regular rhythm and normal heart sounds. Exam reveals no friction rub.  No murmur heard. Pulmonary/Chest: Breath sounds normal. He has no wheezes. He has no rales.  Abdominal: Soft. Bowel sounds are normal. He exhibits no distension. There is no tenderness. There is no rebound and no guarding.  Musculoskeletal: Normal range of motion. He exhibits no edema or tenderness.  Lymphadenopathy:    He has cervical adenopathy.  Neurological: He is alert and oriented to person, place, and time. No cranial nerve deficit.  Gait normal. Coordination normal.  Skin: Skin is dry. No rash noted. He is not diaphoretic. There is erythema.  Bilateral anterior tibial erythematous rash.   Psychiatric: Affect and judgment normal.     LABORATORY DATA:  I have reviewed the data as listed Lab Results  Component Value Date   WBC 2.9 (L) 03/13/2018   HGB 8.9 (L) 03/13/2018   HCT 24.9 (L) 03/13/2018   MCV 90.1 03/13/2018   PLT 225 03/13/2018   Recent Labs    02/27/18 0817 03/06/18 0830 03/13/18 1005  NA 138 139 139  K 3.8 4.0 3.6  CL 103 105 107  CO2 25 26 25   GLUCOSE 111* 117* 132*  BUN 50* 39* 27*  CREATININE 1.47* 1.45* 1.52*  CALCIUM 9.0 8.9 8.9  GFRNONAA 49* 50* 47*  GFRAA 57* 58* 55*  PROT 6.3* 5.9* 6.3*  ALBUMIN 3.1* 3.1* 2.9*  AST 23 20 17   ALT 39 28 23  ALKPHOS 65 58 62  BILITOT 0.7 0.4 0.6     RADIOGRAPHIC STUDIES: I have personally reviewed the radiological images as listed and agreed with the findings in the report. PET scan 12/13/2017 1. Primary hypermetabolic mucosal lesion in the left oropharynx/tongue base with right-sided bulky multilevel lymphadenopathy. 2. No findings for metastatic disease involving the chest, abdomen, pelvis or bony structures.  ASSESSMENT & PLAN:  Clinically he has cT2 cN2 disease, stage II.  1. 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[place])   2. Odynophagia   3. Neoplasm related pain   4. Erysipelas of lower extremity    #Hold weekly carbo and Taxol.  Is currently not on radiation. # Odynophagia, due to RT, ongoing symptoms, improves with current regimen.  He requests refill of his pain Rx. Refills sent to Pharmacy.   # Neutropenia: resolved. Stop Cipro # Erysipelas of lower extremity: start Kefelx 500mg  BID x 5 days.   #Dr. Baruch Gouty has ordered a PET scan to determine whether patient needs additional radiation.  PET result pending. 4 All questions were answered. The patient knows to call the clinic with any problems questions or concerns.  Return of  visit: 1 week Earlie Server, MD, PhD Hematology Oncology [facility] at [facility] Pager- [phone] 03/13/2018

## 2018-03-14 ENCOUNTER — Other Ambulatory Visit: Payer: Self-pay

## 2018-03-14 ENCOUNTER — Ambulatory Visit
Admission: RE | Admit: 2018-03-14 | Discharge: 2018-03-14 | Disposition: A | Discharge status: Home / Self Care | Payer: BLUE CROSS/BLUE SHIELD | Source: Ambulatory Visit | Attending: Radiation Oncology | Admitting: Radiation Oncology

## 2018-03-14 ENCOUNTER — Ambulatory Visit: Payer: BLUE CROSS/BLUE SHIELD

## 2018-03-14 ENCOUNTER — Encounter: Payer: Self-pay | Admitting: Radiation Oncology

## 2018-03-14 VITALS — BP 135/84 | HR 108 | Temp 96.4°F | Resp 18 | Wt 205.9 lb

## 2018-03-14 DIAGNOSIS — Z923 Personal history of irradiation: Secondary | ICD-10-CM | POA: Diagnosis not present

## 2018-03-14 DIAGNOSIS — R590 Localized enlarged lymph nodes: Secondary | ICD-10-CM | POA: Diagnosis not present

## 2018-03-14 DIAGNOSIS — C109 Malignant neoplasm of oropharynx, unspecified: Secondary | ICD-10-CM

## 2018-03-14 DIAGNOSIS — C01 Malignant neoplasm of base of tongue: Secondary | ICD-10-CM | POA: Insufficient documentation

## 2018-03-14 NOTE — Progress Notes (Signed)
Radiation Oncology Follow up Note  Name: Ivan Garcia [facility].   Date:   03/14/2018 MRN:  [id number] DOB: Apr 04, 1956    This 62 y.o. 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 We treated him to 5000 cGy using I MRT treatment planning and delivery to the areas of base of tongue and bulky neck adenopathy. He develop moist desquamation although had a marked response in the right supraclavicular region. I repeated his PET/CT scan for adaptive therapy. PET CT scan shows excellent response to therapy with proximal a 70% response in the right neck at this time. His skin is well-healed. He specifically denies head and neck pain or dysphagia..  COMPLICATIONS OF TREATMENT: none  FOLLOW UP COMPLIANCE: keeps appointments   PHYSICAL EXAM:  BP 135/84   Pulse (!) 108   Temp (!) 96.4 F (35.8 C)   Resp 18   Wt 205 lb 14.6 oz (93.4 kg)   BMI 29.54 kg/m  Oral cavity is clear no oral mucosal lesions are identified. Still some subtle adenopathy in the right lower cervical chain. Well-developed well-nourished patient in NAD. HEENT reveals PERLA, EOMI, discs not visualized.  Oral cavity is clear. No oral mucosal lesions are identified. Neck is clear without evidence of cervical or supraclavicular adenopathy. Lungs are clear to A&P. Cardiac examination is essentially unremarkable with regular rate and rhythm without murmur rub or thrill. Abdomen is benign with no organomegaly or masses noted. Motor sensory and DTR levels are equal and symmetric in the upper and lower extremities. Cranial nerves II through XII are grossly intact. Proprioception is  intact. No peripheral adenopathy or edema is identified. No motor or sensory levels are noted. Crude visual fields are within normal range.  RADIOLOGY RESULTS: PET CT scan reviewed  PLAN: at this time I to go ahead with another 2000 cGy to areas of primary tumor involvement including the right neck as well as base of tongue using PET/CT fusion studyin an adaptive mode based on the tremendous treatment response. Risks and benefits of further treatment were reviewed with the patient. I personally set him up and ordered CT simulation for early next week. His white blood count has been low necessitating holding his chemotherapy. Hopefully we get chemotherapy performed with his last 2 weeks of treatment. Patient copy has my treatment plan well.  I would like to take this opportunity to thank you for allowing me to participate in the care of your patient.Noreene Filbert, MD

## 2018-03-15 ENCOUNTER — Ambulatory Visit: Payer: BLUE CROSS/BLUE SHIELD

## 2018-03-18 ENCOUNTER — Ambulatory Visit
Admission: RE | Admit: 2018-03-18 | Discharge: 2018-03-18 | Disposition: A | Discharge status: Home / Self Care | Payer: BLUE CROSS/BLUE SHIELD | Source: Ambulatory Visit | Attending: Radiation Oncology | Admitting: Radiation Oncology

## 2018-03-18 ENCOUNTER — Ambulatory Visit: Payer: BLUE CROSS/BLUE SHIELD

## 2018-03-18 DIAGNOSIS — C109 Malignant neoplasm of oropharynx, unspecified: Secondary | ICD-10-CM | POA: Insufficient documentation

## 2018-03-18 DIAGNOSIS — Z510 Encounter for antineoplastic radiation therapy: Secondary | ICD-10-CM | POA: Diagnosis not present

## 2018-03-19 ENCOUNTER — Ambulatory Visit: Payer: BLUE CROSS/BLUE SHIELD

## 2018-03-20 ENCOUNTER — Ambulatory Visit
Admission: RE | Admit: 2018-03-20 | Discharge: 2018-03-20 | Disposition: A | Discharge status: Home / Self Care | Payer: BLUE CROSS/BLUE SHIELD | Source: Ambulatory Visit | Attending: Oncology | Admitting: Oncology

## 2018-03-20 ENCOUNTER — Ambulatory Visit: Payer: BLUE CROSS/BLUE SHIELD

## 2018-03-20 ENCOUNTER — Inpatient Hospital Stay: Payer: BLUE CROSS/BLUE SHIELD

## 2018-03-20 ENCOUNTER — Other Ambulatory Visit: Payer: Self-pay

## 2018-03-20 ENCOUNTER — Ambulatory Visit: Payer: BLUE CROSS/BLUE SHIELD | Admitting: Oncology

## 2018-03-20 ENCOUNTER — Inpatient Hospital Stay (HOSPITAL_BASED_OUTPATIENT_CLINIC_OR_DEPARTMENT_OTHER): Payer: BLUE CROSS/BLUE SHIELD | Admitting: Oncology

## 2018-03-20 ENCOUNTER — Other Ambulatory Visit: Payer: BLUE CROSS/BLUE SHIELD

## 2018-03-20 ENCOUNTER — Encounter: Payer: Self-pay | Admitting: Oncology

## 2018-03-20 VITALS — BP 124/70 | HR 108 | Temp 98.3°F | Wt 212.2 lb

## 2018-03-20 DIAGNOSIS — R1310 Dysphagia, unspecified: Secondary | ICD-10-CM

## 2018-03-20 DIAGNOSIS — R600 Localized edema: Secondary | ICD-10-CM | POA: Diagnosis not present

## 2018-03-20 DIAGNOSIS — R609 Edema, unspecified: Secondary | ICD-10-CM

## 2018-03-20 DIAGNOSIS — G893 Neoplasm related pain (acute) (chronic): Secondary | ICD-10-CM

## 2018-03-20 DIAGNOSIS — C109 Malignant neoplasm of oropharynx, unspecified: Secondary | ICD-10-CM | POA: Diagnosis not present

## 2018-03-20 DIAGNOSIS — I129 Hypertensive chronic kidney disease with stage 1 through stage 4 chronic kidney disease, or unspecified chronic kidney disease: Secondary | ICD-10-CM | POA: Diagnosis not present

## 2018-03-20 DIAGNOSIS — N183 Chronic kidney disease, stage 3 unspecified: Secondary | ICD-10-CM

## 2018-03-20 DIAGNOSIS — Z95828 Presence of other vascular implants and grafts: Secondary | ICD-10-CM

## 2018-03-20 DIAGNOSIS — N189 Chronic kidney disease, unspecified: Secondary | ICD-10-CM

## 2018-03-20 LAB — CBC WITH DIFFERENTIAL/PLATELET
Basophils Absolute: 0 10*3/uL (ref 0–0.1)
Basophils Relative: 1 %
EOS ABS: 0 10*3/uL (ref 0–0.7)
EOS PCT: 1 %
HCT: 25.3 % — ABNORMAL LOW (ref 40.0–52.0)
HEMOGLOBIN: 8.9 g/dL — AB (ref 13.0–18.0)
LYMPHS PCT: 8 %
Lymphs Abs: 0.5 10*3/uL — ABNORMAL LOW (ref 1.0–3.6)
MCH: 32.2 pg (ref 26.0–34.0)
MCHC: 35.1 g/dL (ref 32.0–36.0)
MCV: 91.9 fL (ref 80.0–100.0)
MONOS PCT: 6 %
Monocytes Absolute: 0.3 10*3/uL (ref 0.2–1.0)
Neutro Abs: 4.6 10*3/uL (ref 1.4–6.5)
Neutrophils Relative %: 84 %
PLATELETS: 280 10*3/uL (ref 150–440)
RBC: 2.75 MIL/uL — ABNORMAL LOW (ref 4.40–5.90)
RDW: 19.8 % — ABNORMAL HIGH (ref 11.5–14.5)
WBC: 5.5 10*3/uL (ref 3.8–10.6)

## 2018-03-20 LAB — COMPREHENSIVE METABOLIC PANEL
ALBUMIN: 3 g/dL — AB (ref 3.5–5.0)
ALT: 20 U/L (ref 17–63)
AST: 24 U/L (ref 15–41)
Alkaline Phosphatase: 67 U/L (ref 38–126)
Anion gap: 9 (ref 5–15)
BUN: 20 mg/dL (ref 6–20)
CO2: 24 mmol/L (ref 22–32)
CREATININE: 1.84 mg/dL — AB (ref 0.61–1.24)
Calcium: 8.8 mg/dL — ABNORMAL LOW (ref 8.9–10.3)
Chloride: 106 mmol/L (ref 101–111)
GFR calc Af Amer: 44 mL/min — ABNORMAL LOW (ref >60)
GFR calc non Af Amer: 38 mL/min — ABNORMAL LOW (ref >60)
Glucose, Bld: 139 mg/dL — ABNORMAL HIGH (ref 65–99)
Potassium: 3.9 mmol/L (ref 3.5–5.1)
SODIUM: 139 mmol/L (ref 135–145)
Total Bilirubin: 0.4 mg/dL (ref 0.3–1.2)
Total Protein: 6.2 g/dL — ABNORMAL LOW (ref 6.5–8.1)

## 2018-03-20 MED ORDER — HEPARIN SOD (PORK) LOCK FLUSH 100 UNIT/ML IV SOLN
500.0000 [IU] | Freq: Once | INTRAVENOUS | Status: AC
  Administered 2018-03-20: 500 [IU] via INTRAVENOUS

## 2018-03-20 NOTE — Progress Notes (Signed)
Patient here today for follow up.  Patient states no new concerns today  

## 2018-03-20 NOTE — Progress Notes (Signed)
Hematology/Oncology  Follow Up note [facility] [phone] [phone]   Patient Care Team: Albina Billet, MD as PCP - General (Internal Medicine) Albina Billet, MD (Internal Medicine) Bary Castilla Forest Gleason, MD (General Surgery)  REFERRING PROVIDER: Dr.McQueen CHIEF COMPLAINTS/PURPOSE OF CONSULTATION:  Evaluation of newly diagnosed head and neck cancer.  HISTORY OF PRESENTING ILLNESS:  Ivan Garcia. is a  62 y.o.  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 Tami Ribas. With ENT examination, he was found to have midline tongue base is to come down to 2 Weekly which is 40 mg pedunculated mass, 3cm.  And had CT soft tissue neck which confirmed pronounced lymphadenopathy in the right neck including conglomerate partially necrotic nodal mass spanning level III, 4 and 5, measuring 5.2 x 5.5 x 3.7 cm. Additional pathological nodes and right level 2 about 2.5 cm. There is asymmetry in the right tonsillar region. # Biopsy of the right neck mass and pathology revealed squamous cancer. P16 positive.  Current Cancer Treatment Cisplatin 100 mg/m2 every 3 weeks with concurrent RT which started on 12/31/2017.  S/p one dose of Cisplatin. Cisplatin discontinued due to nephrotoxicity.  Switch to weekly Carboplatin (AUC 2) / Taxol 40mmg/m2    INTERVAL HISTORY [name]. is a 62 y.o. male who presents for follow up of head and neck cancer.  Odynophagia, pain gets better after taking pain medication,  He has gained a lot of weight lately, lower extremity swelling. Bilateral lower extremity skin rash has resolved. He is planned to start Radiation again next week, for about another 5-6 RT treatments.  Denies fever, chills, nausea vomiting.   Review of Systems  Constitutional: Negative for chills, diaphoresis, fever, malaise/fatigue and weight loss.  HENT: Negative for congestion, ear  discharge, ear pain, hearing loss, sore throat and tinnitus.        Sore throat  Eyes: Negative for double vision, photophobia, pain, discharge and redness.  Respiratory: Negative for cough, sputum production and shortness of breath.   Cardiovascular: Positive for leg swelling. Negative for chest pain, orthopnea and claudication.  Gastrointestinal: Negative for constipation, diarrhea, heartburn, melena, nausea and vomiting.  Genitourinary: Negative for dysuria, frequency, hematuria and urgency.  Musculoskeletal: Negative for back pain, joint pain, myalgias and neck pain.  Skin: Negative for rash.  Neurological: Negative for dizziness, tremors, sensory change, speech change and focal weakness.  Endo/Heme/Allergies: Negative for polydipsia. Does not bruise/bleed easily.  Psychiatric/Behavioral: Negative for depression, hallucinations, substance abuse and suicidal ideas. The patient is not nervous/anxious.     MEDICAL HISTORY:  Past Medical History:  Diagnosis Date  . Arthritis   . Cancer ([place])    Head and neck cancer  . Hemorrhoids   . Hyperlipidemia   . Hypertension     SURGICAL HISTORY: Past Surgical History:  Procedure Laterality Date  . COLONOSCOPY WITH PROPOFOL N/A 04/04/2017   Procedure: COLONOSCOPY WITH PROPOFOL;  Surgeon: Robert Bellow, MD;  Location: [facility] ENDOSCOPY;  Service: Endoscopy;  Laterality: N/A;  . MANDIBLE SURGERY    . PORTA CATH INSERTION N/A 12/19/2017   Procedure: PORTA CATH INSERTION;  Surgeon: Algernon Huxley, MD;  Location: [place] CV LAB;  Service: Cardiovascular;  Laterality: N/A;  . TONSILLECTOMY      SOCIAL HISTORY: Social History   Socioeconomic History  . Marital status: Single    Spouse name: Not on file  . Number of children:  Not on file  . Years of education: Not on file  . Highest education level: Not on file  Occupational History  . Not on file  Social Needs  . Financial resource strain: Not on file  . Food insecurity:    Worry:  Not on file    Inability: Not on file  . Transportation needs:    Medical: Not on file    Non-medical: Not on file  Tobacco Use  . Smoking status: Never Smoker  . Smokeless tobacco: Never Used  Substance and Sexual Activity  . Alcohol use: No  . Drug use: No  . Sexual activity: Not on file  Lifestyle  . Physical activity:    Days per week: Not on file    Minutes per session: Not on file  . Stress: Not on file  Relationships  . Social connections:    Talks on phone: Not on file    Gets together: Not on file    Attends religious service: Not on file    Active member of club or organization: Not on file    Attends meetings of clubs or organizations: Not on file    Relationship status: Not on file  . Intimate partner violence:    Fear of current or ex partner: Not on file    Emotionally abused: Not on file    Physically abused: Not on file    Forced sexual activity: Not on file  Other Topics Concern  . Not on file  Social History Narrative  . Not on file    FAMILY HISTORY: Family History  Problem Relation Age of Onset  . Breast cancer Mother   . Asthma Mother   . Congestive Heart Failure Mother   . Prostate cancer Father   . Brain cancer Father   . Bladder Cancer Father     ALLERGIES:  has No Known Allergies.  MEDICATIONS:  Current Outpatient Medications  Medication Sig Dispense Refill  . amLODipine (NORVASC) 2.5 MG tablet Take 1 tablet (2.5 mg total) by mouth daily. 30 tablet 0  . lansoprazole (PREVACID) 30 MG capsule Take 1 capsule (30 mg total) by mouth daily at 12 noon. 30 capsule 4  . lidocaine (XYLOCAINE) 2 % solution Use as directed 20 mLs in the mouth or throat every 4 (four) hours as needed for mouth pain (throat pain). 300 mL 0  . Multiple Vitamins-Minerals (MULTIVITAMIN WITH MINERALS) tablet Take 1 tablet by mouth daily.    . ondansetron (ZOFRAN) 8 MG tablet TAKE 1 TABLET (8 MG TOTAL) BY MOUTH 2 (TWO) TIMES DAILY AS NEEDED.  1  . oxyCODONE-acetaminophen  (PERCOCET/ROXICET) 5-325 MG tablet Take 1 tablet by mouth every 8 (eight) hours as needed for severe pain. 42 tablet 0  . rosuvastatin (CRESTOR) 10 MG tablet Take 10 mg by mouth every evening.  4   No current facility-administered medications for this visit.      PHYSICAL EXAMINATION: ECOG PERFORMANCE STATUS: 1 - Symptomatic but completely ambulatory Vitals:   03/20/18 1441  BP: 124/70  Pulse: (!) 108  Temp: 98.3 F (36.8 C)   Physical Exam  Constitutional: He is oriented to person, place, and time. No distress.  HENT:  Head: Normocephalic and atraumatic.  Mouth/Throat: Oropharynx is clear and moist. No oropharyngeal exudate.  Eyes: Pupils are equal, round, and reactive to light. Conjunctivae and EOM are normal. Left eye exhibits no discharge. No scleral icterus.  Neck: Normal range of motion. Neck supple. No tracheal deviation present. No thyromegaly  present.  Right side neck mass has significantly decreased in size..  Cardiovascular: Normal rate, regular rhythm and normal heart sounds. Exam reveals no gallop and no friction rub.  No murmur heard. Pulmonary/Chest: Breath sounds normal. No respiratory distress. He has no wheezes. He has no rales.  Abdominal: Soft. Bowel sounds are normal. He exhibits no distension. There is no tenderness. There is no rebound and no guarding.  Musculoskeletal: Normal range of motion. He exhibits deformity. He exhibits no edema or tenderness.  Bilateral lower extremity edema, 2+  Lymphadenopathy:    He has cervical adenopathy.  Neurological: He is alert and oriented to person, place, and time. No cranial nerve deficit. Gait normal. Coordination normal.  Skin: Skin is dry. No rash noted. He is not diaphoretic. No erythema.  Psychiatric: Affect and judgment normal.     LABORATORY DATA:  I have reviewed the data as listed Lab Results  Component Value Date   WBC 2.9 (L) 03/13/2018   HGB 8.9 (L) 03/13/2018   HCT 24.9 (L) 03/13/2018   MCV 90.1  03/13/2018   PLT 225 03/13/2018   Recent Labs    02/27/18 0817 03/06/18 0830 03/13/18 1005  NA 138 139 139  K 3.8 4.0 3.6  CL 103 105 107  CO2 25 26 25   GLUCOSE 111* 117* 132*  BUN 50* 39* 27*  CREATININE 1.47* 1.45* 1.52*  CALCIUM 9.0 8.9 8.9  GFRNONAA 49* 50* 47*  GFRAA 57* 58* 55*  PROT 6.3* 5.9* 6.3*  ALBUMIN 3.1* 3.1* 2.9*  AST 23 20 17   ALT 39 28 23  ALKPHOS 65 58 62  BILITOT 0.7 0.4 0.6     RADIOGRAPHIC STUDIES: I have personally reviewed the radiological images as listed and agreed with the findings in the report. 03/08/2018  Near complete resolution of prior large right neck mass.Small right cervical/supraclavicular nodal metastases measuring up to 10 mm short axis. No evidence of distal metastases.  ASSESSMENT & PLAN:  Clinically he has cT2 cN2 disease, stage II.  1. Edema, unspecified type   2. Stage 3 chronic kidney disease ([place])   3. Port-A-Cath in place   4. 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[place])   5. Neoplasm related pain   6. Odynophagia    #PET scan results were reviewed independantly and discussed with patient. He has achieve good partial response. He is currently off [place] and Taxol as he Is currently not on radiation, plan to restart next week along with restart of RT.   # Odynophagia, due to RT, improved.  # Erysipelas of lower extremity: resolved.  # Bilateral leg edema: US venous doppler to rule out DVT. Advise patient to make follow up appointment with nephrologist. Most likely edema due to CKD.  All questions were answered. The patient knows to call the clinic with any problems questions or concerns.  Return of visit: 1 week Earlie Server, MD, PhD Hematology Oncology [facility] Pager- [phone] 03/20/2018

## 2018-03-21 ENCOUNTER — Ambulatory Visit: Payer: BLUE CROSS/BLUE SHIELD

## 2018-03-21 ENCOUNTER — Telehealth: Payer: Self-pay | Admitting: *Deleted

## 2018-03-21 DIAGNOSIS — C109 Malignant neoplasm of oropharynx, unspecified: Secondary | ICD-10-CM | POA: Diagnosis not present

## 2018-03-21 NOTE — Telephone Encounter (Signed)
Thanks

## 2018-03-21 NOTE — Telephone Encounter (Signed)
Patient called asking for results of yesterdays [place]   CLINICAL DATA:  Bilateral lower extremity edema  EXAM: BILATERAL LOWER EXTREMITY VENOUS DOPPLER ULTRASOUND  TECHNIQUE: Gray-scale sonography with graded compression, as well as color Doppler and duplex ultrasound were performed to evaluate the lower extremity deep venous systems from the level of the common femoral vein and including the common femoral, femoral, profunda femoral, popliteal and calf veins including the posterior tibial, peroneal and gastrocnemius veins when visible. The superficial great saphenous vein was also interrogated. Spectral Doppler was utilized to evaluate flow at rest and with distal augmentation maneuvers in the common femoral, femoral and popliteal veins.  COMPARISON:  None.  FINDINGS: RIGHT LOWER EXTREMITY  Common Femoral Vein: No evidence of thrombus. Normal compressibility, respiratory phasicity and response to augmentation.  Saphenofemoral Junction: No evidence of thrombus. Normal compressibility and flow on color Doppler imaging.  Profunda Femoral Vein: No evidence of thrombus. Normal compressibility and flow on color Doppler imaging.  Femoral Vein: No evidence of thrombus. Normal compressibility, respiratory phasicity and response to augmentation.  Popliteal Vein: No evidence of thrombus. Normal compressibility, respiratory phasicity and response to augmentation.  Calf Veins: No evidence of thrombus. Normal compressibility and flow on color Doppler imaging.  Superficial Great Saphenous Vein: No evidence of thrombus. Normal compressibility.  Venous Reflux:  None.  Other Findings:  None.  LEFT LOWER EXTREMITY  Common Femoral Vein: No evidence of thrombus. Normal compressibility, respiratory phasicity and response to augmentation.  Saphenofemoral Junction: No evidence of thrombus. Normal compressibility and flow on color Doppler imaging.  Profunda Femoral Vein:  No evidence of thrombus. Normal compressibility and flow on color Doppler imaging.  Femoral Vein: No evidence of thrombus. Normal compressibility, respiratory phasicity and response to augmentation.  Popliteal Vein: No evidence of thrombus. Normal compressibility, respiratory phasicity and response to augmentation.  Calf Veins: No evidence of thrombus. Normal compressibility and flow on color Doppler imaging.  Superficial Great Saphenous Vein: No evidence of thrombus. Normal compressibility.  Venous Reflux:  None.  Other Findings:  None.  IMPRESSION: No evidence of deep venous thrombosis.   Electronically Signed   By: Inez Catalina M.D.   On: 03/20/2018 16:24

## 2018-03-22 ENCOUNTER — Ambulatory Visit: Payer: BLUE CROSS/BLUE SHIELD

## 2018-03-24 DIAGNOSIS — Z95828 Presence of other vascular implants and grafts: Secondary | ICD-10-CM | POA: Insufficient documentation

## 2018-03-24 DIAGNOSIS — N183 Chronic kidney disease, stage 3 unspecified: Secondary | ICD-10-CM | POA: Insufficient documentation

## 2018-03-24 DIAGNOSIS — R609 Edema, unspecified: Secondary | ICD-10-CM | POA: Insufficient documentation

## 2018-03-25 ENCOUNTER — Ambulatory Visit: Payer: BLUE CROSS/BLUE SHIELD

## 2018-03-26 ENCOUNTER — Ambulatory Visit: Payer: BLUE CROSS/BLUE SHIELD

## 2018-03-27 ENCOUNTER — Other Ambulatory Visit: Payer: Self-pay

## 2018-03-27 ENCOUNTER — Ambulatory Visit: Payer: BLUE CROSS/BLUE SHIELD

## 2018-03-27 ENCOUNTER — Inpatient Hospital Stay (HOSPITAL_BASED_OUTPATIENT_CLINIC_OR_DEPARTMENT_OTHER): Payer: BLUE CROSS/BLUE SHIELD | Admitting: Oncology

## 2018-03-27 ENCOUNTER — Encounter: Payer: Self-pay | Admitting: Oncology

## 2018-03-27 ENCOUNTER — Ambulatory Visit
Admission: RE | Admit: 2018-03-27 | Discharge: 2018-03-27 | Disposition: A | Discharge status: Home / Self Care | Payer: BLUE CROSS/BLUE SHIELD | Source: Ambulatory Visit | Attending: Radiation Oncology | Admitting: Radiation Oncology

## 2018-03-27 ENCOUNTER — Inpatient Hospital Stay: Payer: BLUE CROSS/BLUE SHIELD

## 2018-03-27 VITALS — BP 128/84 | HR 114 | Temp 98.0°F | Resp 16 | Wt 211.8 lb

## 2018-03-27 DIAGNOSIS — N183 Chronic kidney disease, stage 3 unspecified: Secondary | ICD-10-CM

## 2018-03-27 DIAGNOSIS — C109 Malignant neoplasm of oropharynx, unspecified: Secondary | ICD-10-CM

## 2018-03-27 DIAGNOSIS — R1310 Dysphagia, unspecified: Secondary | ICD-10-CM

## 2018-03-27 DIAGNOSIS — R609 Edema, unspecified: Secondary | ICD-10-CM

## 2018-03-27 DIAGNOSIS — R600 Localized edema: Secondary | ICD-10-CM | POA: Diagnosis not present

## 2018-03-27 DIAGNOSIS — I129 Hypertensive chronic kidney disease with stage 1 through stage 4 chronic kidney disease, or unspecified chronic kidney disease: Secondary | ICD-10-CM | POA: Diagnosis not present

## 2018-03-27 DIAGNOSIS — Z5111 Encounter for antineoplastic chemotherapy: Secondary | ICD-10-CM

## 2018-03-27 LAB — CBC WITH DIFFERENTIAL/PLATELET
Basophils Absolute: 0.1 10*3/uL (ref 0–0.1)
Basophils Relative: 1 %
EOS ABS: 0.1 10*3/uL (ref 0–0.7)
EOS PCT: 2 %
HCT: 28 % — ABNORMAL LOW (ref 40.0–52.0)
Hemoglobin: 9.8 g/dL — ABNORMAL LOW (ref 13.0–18.0)
LYMPHS ABS: 0.4 10*3/uL — AB (ref 1.0–3.6)
LYMPHS PCT: 6 %
MCH: 32.8 pg (ref 26.0–34.0)
MCHC: 35.1 g/dL (ref 32.0–36.0)
MCV: 93.5 fL (ref 80.0–100.0)
MONO ABS: 0.5 10*3/uL (ref 0.2–1.0)
Monocytes Relative: 7 %
Neutro Abs: 5.2 10*3/uL (ref 1.4–6.5)
Neutrophils Relative %: 84 %
Platelets: 268 10*3/uL (ref 150–440)
RBC: 2.99 MIL/uL — ABNORMAL LOW (ref 4.40–5.90)
RDW: 20.5 % — AB (ref 11.5–14.5)
WBC: 6.2 10*3/uL (ref 3.8–10.6)

## 2018-03-27 LAB — COMPREHENSIVE METABOLIC PANEL
ALBUMIN: 3.3 g/dL — AB (ref 3.5–5.0)
ALT: 17 U/L (ref 17–63)
AST: 23 U/L (ref 15–41)
Alkaline Phosphatase: 75 U/L (ref 38–126)
Anion gap: 8 (ref 5–15)
BILIRUBIN TOTAL: 0.8 mg/dL (ref 0.3–1.2)
BUN: 28 mg/dL — AB (ref 6–20)
CHLORIDE: 107 mmol/L (ref 101–111)
CO2: 24 mmol/L (ref 22–32)
CREATININE: 1.66 mg/dL — AB (ref 0.61–1.24)
Calcium: 9.1 mg/dL (ref 8.9–10.3)
GFR calc Af Amer: 49 mL/min — ABNORMAL LOW (ref >60)
GFR, EST NON AFRICAN AMERICAN: 43 mL/min — AB (ref >60)
GLUCOSE: 107 mg/dL — AB (ref 65–99)
Potassium: 4.1 mmol/L (ref 3.5–5.1)
Sodium: 139 mmol/L (ref 135–145)
TOTAL PROTEIN: 6.5 g/dL (ref 6.5–8.1)

## 2018-03-27 MED ORDER — PACLITAXEL CHEMO INJECTION 300 MG/50ML
45.0000 mg/m2 | Freq: Once | INTRAVENOUS | Status: AC
  Administered 2018-03-27: 96 mg via INTRAVENOUS
  Filled 2018-03-27: qty 16

## 2018-03-27 MED ORDER — DIPHENHYDRAMINE HCL 50 MG/ML IJ SOLN
50.0000 mg | Freq: Once | INTRAMUSCULAR | Status: AC
  Administered 2018-03-27: 50 mg via INTRAVENOUS
  Filled 2018-03-27: qty 1

## 2018-03-27 MED ORDER — SODIUM CHLORIDE 0.9% FLUSH
10.0000 mL | INTRAVENOUS | Status: DC | PRN
  Administered 2018-03-27: 10 mL via INTRAVENOUS
  Filled 2018-03-27: qty 10

## 2018-03-27 MED ORDER — HEPARIN SOD (PORK) LOCK FLUSH 100 UNIT/ML IV SOLN
500.0000 [IU] | Freq: Once | INTRAVENOUS | Status: AC
  Administered 2018-03-27: 500 [IU] via INTRAVENOUS
  Filled 2018-03-27: qty 5

## 2018-03-27 MED ORDER — SODIUM CHLORIDE 0.9 % IV SOLN
Freq: Once | INTRAVENOUS | Status: AC
  Administered 2018-03-27: 10:00:00 via INTRAVENOUS
  Filled 2018-03-27: qty 1000

## 2018-03-27 MED ORDER — HEPARIN SOD (PORK) LOCK FLUSH 100 UNIT/ML IV SOLN
INTRAVENOUS | Status: AC
  Filled 2018-03-27: qty 5

## 2018-03-27 MED ORDER — SODIUM CHLORIDE 0.9 % IV SOLN
176.0000 mg | Freq: Once | INTRAVENOUS | Status: AC
  Administered 2018-03-27: 180 mg via INTRAVENOUS
  Filled 2018-03-27: qty 18

## 2018-03-27 MED ORDER — PALONOSETRON HCL INJECTION 0.25 MG/5ML
0.2500 mg | Freq: Once | INTRAVENOUS | Status: AC
  Administered 2018-03-27: 0.25 mg via INTRAVENOUS
  Filled 2018-03-27: qty 5

## 2018-03-27 MED ORDER — DEXAMETHASONE SODIUM PHOSPHATE 10 MG/ML IJ SOLN
10.0000 mg | Freq: Once | INTRAMUSCULAR | Status: AC
  Administered 2018-03-27: 10 mg via INTRAVENOUS
  Filled 2018-03-27: qty 1

## 2018-03-27 NOTE — Progress Notes (Signed)
Ivan Garcia receiving his Taxol/Carbo treatment today per Dr. Tasia Catchings.

## 2018-03-27 NOTE — Progress Notes (Signed)
Cr 1.66, md aware and ok to proceed with treatment

## 2018-03-27 NOTE — Progress Notes (Signed)
Patient here for treatment today. He has no complaints today.

## 2018-03-27 NOTE — Progress Notes (Signed)
Hematology/Oncology  Follow Up note [facility] [phone] Fax:(336) [phone]   Patient Care Team: Albina Billet, MD as PCP - General (Internal Medicine) Albina Billet, MD (Internal Medicine) Bary Castilla Forest Gleason, MD (General Surgery)  REFERRING PROVIDER: Dr.McQueen CHIEF COMPLAINTS/PURPOSE OF CONSULTATION:  Evaluation of newly diagnosed head and neck cancer.  HISTORY OF PRESENTING ILLNESS:  Ivan Garcia. is a  62 y.o.  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 Tami Ribas. With ENT examination, he was found to have midline tongue base is to come down to 2 Weekly which is 40 mg pedunculated mass, 3cm.  And had CT soft tissue neck which confirmed pronounced lymphadenopathy in the right neck including conglomerate partially necrotic nodal mass spanning level III, 4 and 5, measuring 5.2 x 5.5 x 3.7 cm. Additional pathological nodes and right level 2 about 2.5 cm. There is asymmetry in the right tonsillar region. # Biopsy of the right neck mass and pathology revealed squamous cancer. P16 positive.  Current Cancer Treatment Cisplatin 100 mg/m2 every 3 weeks with concurrent RT which started on 12/31/2017.  S/p one dose of Cisplatin. Cisplatin discontinued due to nephrotoxicity.  Switch to weekly Carboplatin (AUC 2) / Taxol 53mmg/m2    INTERVAL HISTORY [name]. is a 62 y.o. male who presents for follow up of head and neck squamous cancer.  Odynophagia:  Improved after being off RT for a few weeks.  He is having simulation today and plan resume  Radiation for about 5-6 treatments tomorrow.  Denies fever, chills, nausea vomiting.   Review of Systems  Constitutional: Negative for diaphoresis, fever and malaise/fatigue.  HENT: Negative for congestion, ear discharge, ear pain, hearing loss, nosebleeds, sinus pain, sore throat and tinnitus.   Eyes: Negative for blurred  vision, pain, discharge and redness.  Respiratory: Negative for cough, hemoptysis, sputum production and shortness of breath.   Cardiovascular: Positive for leg swelling. Negative for chest pain, orthopnea and claudication.  Gastrointestinal: Negative for diarrhea, heartburn, melena, nausea and vomiting.  Genitourinary: Negative for dysuria and hematuria.  Musculoskeletal: Negative for back pain, joint pain, myalgias and neck pain.  Skin: Negative for itching and rash.  Neurological: Negative for dizziness, tingling, tremors, sensory change, speech change and focal weakness.  Endo/Heme/Allergies: Negative for polydipsia. Does not bruise/bleed easily.  Psychiatric/Behavioral: Negative for depression, substance abuse and suicidal ideas.    MEDICAL HISTORY:  Past Medical History:  Diagnosis Date  . Arthritis   . Cancer ([place])    Head and neck cancer  . Hemorrhoids   . Hyperlipidemia   . Hypertension     SURGICAL HISTORY: Past Surgical History:  Procedure Laterality Date  . COLONOSCOPY WITH PROPOFOL N/A 04/04/2017   Procedure: COLONOSCOPY WITH PROPOFOL;  Surgeon: Robert Bellow, MD;  Location: [facility] ENDOSCOPY;  Service: Endoscopy;  Laterality: N/A;  . MANDIBLE SURGERY    . PORTA CATH INSERTION N/A 12/19/2017   Procedure: PORTA CATH INSERTION;  Surgeon: Algernon Huxley, MD;  Location: [facility] LAB;  Service: Cardiovascular;  Laterality: N/A;  . TONSILLECTOMY      SOCIAL HISTORY: Social History   Socioeconomic History  . Marital status: Single    Spouse name: Not on file  . Number of children: Not on file  . Years of education: Not on file  . Highest education level: Not on file  Occupational History  . Not on file  Social  Needs  . Financial resource strain: Not on file  . Food insecurity:    Worry: Not on file    Inability: Not on file  . Transportation needs:    Medical: Not on file    Non-medical: Not on file  Tobacco Use  . Smoking status: Never Smoker  .  Smokeless tobacco: Never Used  Substance and Sexual Activity  . Alcohol use: No  . Drug use: No  . Sexual activity: Not on file  Lifestyle  . Physical activity:    Days per week: Not on file    Minutes per session: Not on file  . Stress: Not on file  Relationships  . Social connections:    Talks on phone: Not on file    Gets together: Not on file    Attends religious service: Not on file    Active member of club or organization: Not on file    Attends meetings of clubs or organizations: Not on file    Relationship status: Not on file  . Intimate partner violence:    Fear of current or ex partner: Not on file    Emotionally abused: Not on file    Physically abused: Not on file    Forced sexual activity: Not on file  Other Topics Concern  . Not on file  Social History Narrative  . Not on file    FAMILY HISTORY: Family History  Problem Relation Age of Onset  . Breast cancer Mother   . Asthma Mother   . Congestive Heart Failure Mother   . Prostate cancer Father   . Brain cancer Father   . Bladder Cancer Father     ALLERGIES:  has No Known Allergies.  MEDICATIONS:  Current Outpatient Medications  Medication Sig Dispense Refill  . amLODipine (NORVASC) 2.5 MG tablet Take 1 tablet (2.5 mg total) by mouth daily. 30 tablet 0  . lansoprazole (PREVACID) 30 MG capsule Take 1 capsule (30 mg total) by mouth daily at 12 noon. 30 capsule 4  . lidocaine (XYLOCAINE) 2 % solution Use as directed 20 mLs in the mouth or throat every 4 (four) hours as needed for mouth pain (throat pain). 300 mL 0  . Multiple Vitamins-Minerals (MULTIVITAMIN WITH MINERALS) tablet Take 1 tablet by mouth daily.    . ondansetron (ZOFRAN) 8 MG tablet TAKE 1 TABLET (8 MG TOTAL) BY MOUTH 2 (TWO) TIMES DAILY AS NEEDED.  1  . oxyCODONE-acetaminophen (PERCOCET/ROXICET) 5-325 MG tablet Take 1 tablet by mouth every 8 (eight) hours as needed for severe pain. 42 tablet 0  . rosuvastatin (CRESTOR) 10 MG tablet Take 10 mg  by mouth every evening.  4   No current facility-administered medications for this visit.    Facility-Administered Medications Ordered in Other Visits  Medication Dose Route Frequency Provider Last Rate Last Dose  . heparin lock flush 100 unit/mL  500 Units Intravenous Once Earlie Server, MD      . sodium chloride flush (NS) 0.9 % injection 10 mL  10 mL Intravenous PRN Earlie Server, MD   10 mL at 03/27/18 2979     PHYSICAL EXAMINATION: ECOG PERFORMANCE STATUS: 1 - Symptomatic but completely ambulatory Vitals:   03/27/18 0838 03/27/18 0842  BP:  128/84  Pulse:  (!) 114  Resp: 16   Temp:  98 F (36.7 C)   Physical Exam  Constitutional: He is oriented to person, place, and time and well-developed, well-nourished, and in no distress. No distress.  HENT:  Head:  Normocephalic and atraumatic.  Right Ear: External ear normal.  Left Ear: External ear normal.  Nose: Nose normal.  Mouth/Throat: Oropharynx is clear and moist. No oropharyngeal exudate.  Eyes: Pupils are equal, round, and reactive to light. Conjunctivae and EOM are normal. Left eye exhibits no discharge. No scleral icterus.  Neck: Normal range of motion. Neck supple. No JVD present. No tracheal deviation present. No thyromegaly present.  Right side neck mass has significantly decreased in size..  Cardiovascular: Normal rate, regular rhythm and normal heart sounds. Exam reveals no gallop and no friction rub.  No murmur heard. Pulmonary/Chest: Breath sounds normal. No respiratory distress. He has no wheezes. He has no rales.  Abdominal: Soft. Bowel sounds are normal. He exhibits no distension. There is no tenderness. There is no rebound and no guarding.  Musculoskeletal: Normal range of motion. He exhibits deformity. He exhibits no edema or tenderness.  Bilateral lower extremity edema, 1+  Lymphadenopathy:    He has cervical adenopathy.  Neurological: He is alert and oriented to person, place, and time. No cranial nerve deficit. Gait  normal. Coordination normal.  Skin: Skin is dry. No rash noted. He is not diaphoretic. No erythema. No pallor.  Psychiatric: Mood, memory, affect and judgment normal.     LABORATORY DATA:  I have reviewed the data as listed Lab Results  Component Value Date   WBC 6.2 03/27/2018   HGB 9.8 (L) 03/27/2018   HCT 28.0 (L) 03/27/2018   MCV 93.5 03/27/2018   PLT 268 03/27/2018   Recent Labs    03/13/18 1005 03/20/18 1419 03/27/18 0835  NA 139 139 139  K 3.6 3.9 4.1  CL 107 106 107  CO2 25 24 24   GLUCOSE 132* 139* 107*  BUN 27* 20 28*  CREATININE 1.52* 1.84* 1.66*  CALCIUM 8.9 8.8* 9.1  GFRNONAA 47* 38* 43*  GFRAA 55* 44* 49*  PROT 6.3* 6.2* 6.5  ALBUMIN 2.9* 3.0* 3.3*  AST 17 24 23   ALT 23 20 17   ALKPHOS 62 67 75  BILITOT 0.6 0.4 0.8     RADIOGRAPHIC STUDIES: I have personally reviewed the radiological images as listed and agreed with the findings in the report. 03/08/2018  Near complete resolution of prior large right neck mass.Small right cervical/supraclavicular nodal metastases measuring up to 10 mm short axis. No evidence of distal metastases.  ASSESSMENT & PLAN:  Clinically he has cT2 cN2 disease, stage II.  1. Encounter for antineoplastic chemotherapy   2. Stage 3 chronic kidney disease ([place])   3. 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[place])   4. Odynophagia   5. Edema, unspecified type    # will resume concurrent chemotherapy today with weekly Carboplatin (AUC 2) / Taxol 30mmg/m2  # Odynophagia, due to RT, improved.  # Erysipelas of lower extremity: resolved.  # Bilateral leg edema: US venous doppler negative for DVT. Continue low salt diet.  follow up appointment with nephrologist. Most likely edema due to CKD.  All questions were answered. The patient knows to call the clinic with any problems questions or concerns.  Return of visit: 1 week Earlie Server, MD, PhD Hematology Oncology [facility] Pager- [phone] 03/27/2018

## 2018-03-28 ENCOUNTER — Ambulatory Visit
Admission: RE | Admit: 2018-03-28 | Discharge: 2018-03-28 | Disposition: A | Discharge status: Home / Self Care | Payer: BLUE CROSS/BLUE SHIELD | Source: Ambulatory Visit | Attending: Radiation Oncology | Admitting: Radiation Oncology

## 2018-03-28 DIAGNOSIS — C109 Malignant neoplasm of oropharynx, unspecified: Secondary | ICD-10-CM | POA: Diagnosis not present

## 2018-03-29 ENCOUNTER — Ambulatory Visit
Admission: RE | Admit: 2018-03-29 | Discharge: 2018-03-29 | Disposition: A | Discharge status: Home / Self Care | Payer: BLUE CROSS/BLUE SHIELD | Source: Ambulatory Visit | Attending: Radiation Oncology | Admitting: Radiation Oncology

## 2018-03-29 DIAGNOSIS — C109 Malignant neoplasm of oropharynx, unspecified: Secondary | ICD-10-CM | POA: Diagnosis not present

## 2018-04-01 ENCOUNTER — Ambulatory Visit
Admission: RE | Admit: 2018-04-01 | Discharge: 2018-04-01 | Disposition: A | Discharge status: Home / Self Care | Payer: BLUE CROSS/BLUE SHIELD | Source: Ambulatory Visit | Attending: Radiation Oncology | Admitting: Radiation Oncology

## 2018-04-01 ENCOUNTER — Ambulatory Visit: Payer: BLUE CROSS/BLUE SHIELD

## 2018-04-01 DIAGNOSIS — C109 Malignant neoplasm of oropharynx, unspecified: Secondary | ICD-10-CM | POA: Diagnosis not present

## 2018-04-02 ENCOUNTER — Ambulatory Visit
Admission: RE | Admit: 2018-04-02 | Discharge: 2018-04-02 | Disposition: A | Discharge status: Home / Self Care | Payer: BLUE CROSS/BLUE SHIELD | Source: Ambulatory Visit | Attending: Radiation Oncology | Admitting: Radiation Oncology

## 2018-04-02 DIAGNOSIS — C109 Malignant neoplasm of oropharynx, unspecified: Secondary | ICD-10-CM | POA: Diagnosis not present

## 2018-04-03 ENCOUNTER — Other Ambulatory Visit: Payer: Self-pay

## 2018-04-03 ENCOUNTER — Inpatient Hospital Stay (HOSPITAL_BASED_OUTPATIENT_CLINIC_OR_DEPARTMENT_OTHER): Payer: BLUE CROSS/BLUE SHIELD | Admitting: Oncology

## 2018-04-03 ENCOUNTER — Inpatient Hospital Stay: Payer: BLUE CROSS/BLUE SHIELD

## 2018-04-03 ENCOUNTER — Inpatient Hospital Stay: Payer: BLUE CROSS/BLUE SHIELD | Attending: Oncology

## 2018-04-03 ENCOUNTER — Encounter: Payer: Self-pay | Admitting: Oncology

## 2018-04-03 ENCOUNTER — Ambulatory Visit
Admission: RE | Admit: 2018-04-03 | Discharge: 2018-04-03 | Disposition: A | Discharge status: Home / Self Care | Payer: BLUE CROSS/BLUE SHIELD | Source: Ambulatory Visit | Attending: Radiation Oncology | Admitting: Radiation Oncology

## 2018-04-03 VITALS — BP 123/80 | HR 83 | Temp 97.0°F | Resp 18 | Wt 207.4 lb

## 2018-04-03 DIAGNOSIS — T451X5A Adverse effect of antineoplastic and immunosuppressive drugs, initial encounter: Secondary | ICD-10-CM

## 2018-04-03 DIAGNOSIS — C109 Malignant neoplasm of oropharynx, unspecified: Secondary | ICD-10-CM | POA: Insufficient documentation

## 2018-04-03 DIAGNOSIS — I129 Hypertensive chronic kidney disease with stage 1 through stage 4 chronic kidney disease, or unspecified chronic kidney disease: Secondary | ICD-10-CM | POA: Diagnosis not present

## 2018-04-03 DIAGNOSIS — D6481 Anemia due to antineoplastic chemotherapy: Secondary | ICD-10-CM

## 2018-04-03 DIAGNOSIS — G893 Neoplasm related pain (acute) (chronic): Secondary | ICD-10-CM

## 2018-04-03 DIAGNOSIS — R1310 Dysphagia, unspecified: Secondary | ICD-10-CM

## 2018-04-03 DIAGNOSIS — N183 Chronic kidney disease, stage 3 unspecified: Secondary | ICD-10-CM

## 2018-04-03 DIAGNOSIS — Z5111 Encounter for antineoplastic chemotherapy: Secondary | ICD-10-CM | POA: Insufficient documentation

## 2018-04-03 DIAGNOSIS — Z510 Encounter for antineoplastic radiation therapy: Secondary | ICD-10-CM | POA: Insufficient documentation

## 2018-04-03 DIAGNOSIS — C01 Malignant neoplasm of base of tongue: Secondary | ICD-10-CM | POA: Diagnosis present

## 2018-04-03 LAB — COMPREHENSIVE METABOLIC PANEL
ALK PHOS: 75 U/L (ref 38–126)
ALT: 15 U/L — ABNORMAL LOW (ref 17–63)
ANION GAP: 8 (ref 5–15)
AST: 18 U/L (ref 15–41)
Albumin: 3.4 g/dL — ABNORMAL LOW (ref 3.5–5.0)
BILIRUBIN TOTAL: 0.5 mg/dL (ref 0.3–1.2)
BUN: 27 mg/dL — ABNORMAL HIGH (ref 6–20)
CALCIUM: 9.4 mg/dL (ref 8.9–10.3)
CO2: 23 mmol/L (ref 22–32)
Chloride: 109 mmol/L (ref 101–111)
Creatinine, Ser: 1.47 mg/dL — ABNORMAL HIGH (ref 0.61–1.24)
GFR calc non Af Amer: 49 mL/min — ABNORMAL LOW (ref >60)
GFR, EST AFRICAN AMERICAN: 57 mL/min — AB (ref >60)
Glucose, Bld: 94 mg/dL (ref 65–99)
Potassium: 4.5 mmol/L (ref 3.5–5.1)
SODIUM: 140 mmol/L (ref 135–145)
TOTAL PROTEIN: 6.6 g/dL (ref 6.5–8.1)

## 2018-04-03 LAB — CBC WITH DIFFERENTIAL/PLATELET
BASOS PCT: 1 %
Basophils Absolute: 0.1 10*3/uL (ref 0–0.1)
Eosinophils Absolute: 0.2 10*3/uL (ref 0–0.7)
Eosinophils Relative: 3 %
HEMATOCRIT: 28 % — AB (ref 40.0–52.0)
HEMOGLOBIN: 9.9 g/dL — AB (ref 13.0–18.0)
Lymphocytes Relative: 6 %
Lymphs Abs: 0.5 10*3/uL — ABNORMAL LOW (ref 1.0–3.6)
MCH: 33.6 pg (ref 26.0–34.0)
MCHC: 35.5 g/dL (ref 32.0–36.0)
MCV: 94.6 fL (ref 80.0–100.0)
Monocytes Absolute: 0.5 10*3/uL (ref 0.2–1.0)
Monocytes Relative: 7 %
NEUTROS ABS: 6 10*3/uL (ref 1.4–6.5)
NEUTROS PCT: 83 %
Platelets: 281 10*3/uL (ref 150–440)
RBC: 2.96 MIL/uL — AB (ref 4.40–5.90)
RDW: 19.9 % — AB (ref 11.5–14.5)
WBC: 7.2 10*3/uL (ref 3.8–10.6)

## 2018-04-03 MED ORDER — SODIUM CHLORIDE 0.9 % IV SOLN
180.0000 mg | Freq: Once | INTRAVENOUS | Status: AC
  Administered 2018-04-03: 180 mg via INTRAVENOUS
  Filled 2018-04-03: qty 18

## 2018-04-03 MED ORDER — HEPARIN SOD (PORK) LOCK FLUSH 100 UNIT/ML IV SOLN: 500.0000 [IU] | Freq: Once | INTRAVENOUS | Status: DC | PRN

## 2018-04-03 MED ORDER — DIPHENHYDRAMINE HCL 50 MG/ML IJ SOLN
50.0000 mg | Freq: Once | INTRAMUSCULAR | Status: AC
Start: 2018-04-03 — End: 2018-04-03
  Administered 2018-04-03: 50 mg via INTRAVENOUS
  Filled 2018-04-03: qty 1

## 2018-04-03 MED ORDER — HEPARIN SOD (PORK) LOCK FLUSH 100 UNIT/ML IV SOLN
500.0000 [IU] | Freq: Once | INTRAVENOUS | Status: AC
  Administered 2018-04-03: 500 [IU] via INTRAVENOUS
  Filled 2018-04-03: qty 5

## 2018-04-03 MED ORDER — SODIUM CHLORIDE 0.9 % IV SOLN
Freq: Once | INTRAVENOUS | Status: AC
  Administered 2018-04-03: 10:00:00 via INTRAVENOUS
  Filled 2018-04-03: qty 1000

## 2018-04-03 MED ORDER — PALONOSETRON HCL INJECTION 0.25 MG/5ML
0.2500 mg | Freq: Once | INTRAVENOUS | Status: AC
  Administered 2018-04-03: 0.25 mg via INTRAVENOUS
  Filled 2018-04-03: qty 5

## 2018-04-03 MED ORDER — SODIUM CHLORIDE 0.9 % IV SOLN: 10.0000 mg | Freq: Once | INTRAVENOUS | Status: DC

## 2018-04-03 MED ORDER — DEXAMETHASONE SODIUM PHOSPHATE 10 MG/ML IJ SOLN
10.0000 mg | Freq: Once | INTRAMUSCULAR | Status: AC
  Administered 2018-04-03: 10 mg via INTRAVENOUS
  Filled 2018-04-03: qty 1

## 2018-04-03 MED ORDER — SODIUM CHLORIDE 0.9% FLUSH
10.0000 mL | Freq: Once | INTRAVENOUS | Status: AC
  Administered 2018-04-03: 10 mL via INTRAVENOUS
  Filled 2018-04-03: qty 10

## 2018-04-03 MED ORDER — OXYCODONE-ACETAMINOPHEN 5-325 MG PO TABS: 1.0000 | ORAL_TABLET | Freq: Three times a day (TID) | ORAL | 0 refills | Status: DC | PRN

## 2018-04-03 MED ORDER — SODIUM CHLORIDE 0.9 % IV SOLN
45.0000 mg/m2 | Freq: Once | INTRAVENOUS | Status: AC
  Administered 2018-04-03: 96 mg via INTRAVENOUS
  Filled 2018-04-03: qty 16

## 2018-04-03 MED ORDER — SODIUM CHLORIDE 0.9% FLUSH
10.0000 mL | INTRAVENOUS | Status: DC | PRN
  Filled 2018-04-03: qty 10

## 2018-04-03 MED ORDER — SODIUM CHLORIDE 0.9 % IV SOLN: 50.0000 mg | Freq: Once | INTRAVENOUS | Status: DC

## 2018-04-03 NOTE — Progress Notes (Signed)
Hematology/Oncology  Follow Up note [facility] Telephone:(336) [phone] Fax:([phone]   Patient Care Team: Albina Billet, MD as PCP - General (Internal Medicine) Albina Billet, MD (Internal Medicine) Bary Castilla Forest Gleason, MD (General Surgery)  REFERRING PROVIDER: Dr.McQueen CHIEF COMPLAINTS/PURPOSE OF CONSULTATION:  Evaluation of newly diagnosed head and neck cancer.  HISTORY OF PRESENTING ILLNESS:  Ivan Garcia. is a  62 y.o.  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 Tami Ribas. With ENT examination, he was found to have midline tongue base is to come down to 2 Weekly which is 40 mg pedunculated mass, 3cm.  And had CT soft tissue neck which confirmed pronounced lymphadenopathy in the right neck including conglomerate partially necrotic nodal mass spanning level III, 4 and 5, measuring 5.2 x 5.5 x 3.7 cm. Additional pathological nodes and right level 2 about 2.5 cm. There is asymmetry in the right tonsillar region. # Biopsy of the right neck mass and pathology revealed squamous cancer. P16 positive.  Current Cancer Treatment Cisplatin 100 mg/m2 every 3 weeks with concurrent RT which started on 12/31/2017.  S/p one dose of Cisplatin. Cisplatin discontinued due to nephrotoxicity.  Switch to weekly Carboplatin (AUC 2) / Taxol 43mmg/m2    INTERVAL HISTORY Ivan Garcia. is a 62 y.o. male who presents for follow up of head and neck squamous cancer. He restarted RT last week. Odynophagia: worsened after resumed RT. Takes Oxycodone as instructed with good pain relief.  Leg swelling has improved after he switched to low salt diet. Nephrologist appointment in May.  Denies fever, chills, nausea vomiting.   Review of Systems  Constitutional: Negative for chills, diaphoresis, fever, malaise/fatigue and weight loss.  HENT: Negative for congestion, ear discharge, ear pain, hearing loss,  nosebleeds, sinus pain, sore throat and tinnitus.        Pain with swallowing.   Eyes: Negative for blurred vision, double vision, photophobia, pain, discharge and redness.  Respiratory: Negative for cough, hemoptysis, sputum production, shortness of breath and wheezing.   Cardiovascular: Negative for chest pain, palpitations, orthopnea, claudication and leg swelling.  Gastrointestinal: Negative for abdominal pain, blood in stool, constipation, diarrhea, heartburn, melena, nausea and vomiting.  Genitourinary: Negative for dysuria, flank pain, frequency and hematuria.  Musculoskeletal: Negative for back pain, joint pain, myalgias and neck pain.  Skin: Negative for itching and rash.  Neurological: Negative for dizziness, tingling, tremors, sensory change, speech change, focal weakness, weakness and headaches.  Endo/Heme/Allergies: Negative for environmental allergies and polydipsia. Does not bruise/bleed easily.  Psychiatric/Behavioral: Negative for depression, hallucinations, substance abuse and suicidal ideas. The patient is not nervous/anxious.     MEDICAL HISTORY:  Past Medical History:  Diagnosis Date  . Arthritis   . Cancer ([place])    Head and neck cancer  . Hemorrhoids   . Hyperlipidemia   . Hypertension     SURGICAL HISTORY: Past Surgical History:  Procedure Laterality Date  . COLONOSCOPY WITH PROPOFOL N/A 04/04/2017   Procedure: COLONOSCOPY WITH PROPOFOL;  Surgeon: Robert Bellow, MD;  Location: [facility] ENDOSCOPY;  Service: Endoscopy;  Laterality: N/A;  . MANDIBLE SURGERY    . PORTA CATH INSERTION N/A 12/19/2017   Procedure: PORTA CATH INSERTION;  Surgeon: Algernon Huxley, MD;  Location: [place] [facility] LAB;  Service: Cardiovascular;  Laterality: N/A;  . TONSILLECTOMY      SOCIAL HISTORY: Social History   Socioeconomic History  . Marital status:  Single    Spouse name: Not on file  . Number of children: Not on file  . Years of education: Not on file  . Highest education  level: Not on file  Occupational History  . Not on file  Social Needs  . Financial resource strain: Not on file  . Food insecurity:    Worry: Not on file    Inability: Not on file  . Transportation needs:    Medical: Not on file    Non-medical: Not on file  Tobacco Use  . Smoking status: Never Smoker  . Smokeless tobacco: Never Used  Substance and Sexual Activity  . Alcohol use: No  . Drug use: No  . Sexual activity: Not on file  Lifestyle  . Physical activity:    Days per week: Not on file    Minutes per session: Not on file  . Stress: Not on file  Relationships  . Social connections:    Talks on phone: Not on file    Gets together: Not on file    Attends religious service: Not on file    Active member of club or organization: Not on file    Attends meetings of clubs or organizations: Not on file    Relationship status: Not on file  . Intimate partner violence:    Fear of current or ex partner: Not on file    Emotionally abused: Not on file    Physically abused: Not on file    Forced sexual activity: Not on file  Other Topics Concern  . Not on file  Social History Narrative  . Not on file    FAMILY HISTORY: Family History  Problem Relation Age of Onset  . Breast cancer Mother   . Asthma Mother   . Congestive Heart Failure Mother   . Prostate cancer Father   . Brain cancer Father   . Bladder Cancer Father     ALLERGIES:  has No Known Allergies.  MEDICATIONS:  Current Outpatient Medications  Medication Sig Dispense Refill  . amLODipine (NORVASC) 2.5 MG tablet Take 1 tablet (2.5 mg total) by mouth daily. 30 tablet 0  . lansoprazole (PREVACID) 30 MG capsule Take 1 capsule (30 mg total) by mouth daily at 12 noon. 30 capsule 4  . Multiple Vitamins-Minerals (MULTIVITAMIN WITH MINERALS) tablet Take 1 tablet by mouth daily.    . ondansetron (ZOFRAN) 8 MG tablet TAKE 1 TABLET (8 MG TOTAL) BY MOUTH 2 (TWO) TIMES DAILY AS NEEDED.  1  . oxyCODONE-acetaminophen  (PERCOCET/ROXICET) 5-325 MG tablet Take 1 tablet by mouth every 8 (eight) hours as needed for severe pain. 30 tablet 0  . rosuvastatin (CRESTOR) 10 MG tablet Take 10 mg by mouth every evening.  4  . lidocaine (XYLOCAINE) 2 % solution Use as directed 20 mLs in the mouth or throat every 4 (four) hours as needed for mouth pain (throat pain). (Patient not taking: Reported on 04/03/2018) 300 mL 0   No current facility-administered medications for this visit.    Facility-Administered Medications Ordered in Other Visits  Medication Dose Route Frequency Provider Last Rate Last Dose  . CARBOplatin (PARAPLATIN) 180 mg in sodium chloride 0.9 % 250 mL chemo infusion  180 mg Intravenous Once Earlie Server, MD      . heparin lock flush 100 unit/mL  500 Units Intravenous Once Earlie Server, MD      . heparin lock flush 100 unit/mL  500 Units Intracatheter Once PRN Earlie Server, MD      .  PACLitaxel (TAXOL) 96 mg in sodium chloride 0.9 % 250 mL chemo infusion (</= 80mg /m2)  45 mg/m2 (Treatment Plan Recorded) Intravenous Once Earlie Server, MD 266 mL/hr at 04/03/18 1100 96 mg at 04/03/18 1100  . sodium chloride flush (NS) 0.9 % injection 10 mL  10 mL Intracatheter PRN Earlie Server, MD         PHYSICAL EXAMINATION: ECOG PERFORMANCE STATUS: 1 - Symptomatic but completely ambulatory Vitals:   04/03/18 0853  BP: 123/80  Pulse: 83  Resp: 18  Temp: (!) 97 F (36.1 C)   Physical Exam  Constitutional: He is oriented to person, place, and time and well-developed, well-nourished, and in no distress. No distress.  HENT:  Head: Normocephalic and atraumatic.  Right Ear: External ear normal.  Left Ear: External ear normal.  Nose: Nose normal.  No thrush Dry oral mucosa  Eyes: Pupils are equal, round, and reactive to light. Conjunctivae and EOM are normal. Left eye exhibits no discharge. No scleral icterus.  Neck: Normal range of motion. Neck supple. No JVD present. No tracheal deviation present. No thyromegaly present.  Right side  neck mass has significantly decreased in size..  Cardiovascular: Normal rate, regular rhythm and normal heart sounds. Exam reveals no gallop and no friction rub.  No murmur heard. Pulmonary/Chest: Effort normal and breath sounds normal. No respiratory distress. He has no wheezes. He has no rales. He exhibits no tenderness.  Abdominal: Soft. Bowel sounds are normal. He exhibits no distension and no mass. There is no tenderness. There is no rebound and no guarding.  Musculoskeletal: Normal range of motion. He exhibits no edema, tenderness or deformity.  Bilateral lower extremity edema, 1+  Lymphadenopathy:    He has cervical adenopathy.  Neurological: He is alert and oriented to person, place, and time. No cranial nerve deficit. He exhibits normal muscle tone. Gait normal. Coordination normal.  Skin: Skin is warm and dry. No rash noted. He is not diaphoretic. No erythema. No pallor.  Psychiatric: Mood, memory, affect and judgment normal.     LABORATORY DATA:  I have reviewed the data as listed Lab Results  Component Value Date   WBC 7.2 04/03/2018   HGB 9.9 (L) 04/03/2018   HCT 28.0 (L) 04/03/2018   MCV 94.6 04/03/2018   PLT 281 04/03/2018   Recent Labs    03/20/18 1419 03/27/18 0835 04/03/18 0809  NA 139 139 140  K 3.9 4.1 4.5  CL 106 107 109  CO2 24 24 23   GLUCOSE 139* 107* 94  BUN 20 28* 27*  CREATININE 1.84* 1.66* 1.47*  CALCIUM 8.8* 9.1 9.4  GFRNONAA 38* 43* 49*  GFRAA 44* 49* 57*  PROT 6.2* 6.5 6.6  ALBUMIN 3.0* 3.3* 3.4*  AST 24 23 18   ALT 20 17 15*  ALKPHOS 67 75 75  BILITOT 0.4 0.8 0.5     RADIOGRAPHIC STUDIES: I have personally reviewed the radiological images as listed and agreed with the findings in the report. 03/08/2018  Near complete resolution of prior large right neck mass.Small right cervical/supraclavicular nodal metastases measuring up to 10 mm short axis. No evidence of distal metastases.  ASSESSMENT & PLAN:  Clinically he has cT2 cN2 disease,  stage II.  1. 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[facility])   2. Stage 3 chronic kidney disease ([place])   3. Encounter for antineoplastic chemotherapy   4. Odynophagia   5. Neoplasm related pain   6. Anemia due to chemotherapy    # Proceed with today's  weekly Carboplatin (AUC 2) / Taxol 31mmg/m2/  Tolerates well.   # Odynophagia, due to RT,continue oxycodone 5mg  Q8h as needed for pain, refill Rx.Declines mouth rash.   # Edema secondary to CKD, resolved, after low salt diet. Follow up with nephrology. Avoid NSAIDs discussed with patient.   All questions were answered. The patient knows to call the clinic with any problems questions or concerns.  Return of visit: 1 week Earlie Server, MD, PhD Hematology Oncology [facility] Pager- [phone] 04/03/2018

## 2018-04-03 NOTE — Progress Notes (Signed)
Patient here for follow up. No concerns voiced.  °

## 2018-04-04 ENCOUNTER — Ambulatory Visit
Admission: RE | Admit: 2018-04-04 | Discharge: 2018-04-04 | Disposition: A | Discharge status: Home / Self Care | Payer: BLUE CROSS/BLUE SHIELD | Source: Ambulatory Visit | Attending: Radiation Oncology | Admitting: Radiation Oncology

## 2018-04-04 DIAGNOSIS — Z510 Encounter for antineoplastic radiation therapy: Secondary | ICD-10-CM | POA: Diagnosis not present

## 2018-04-05 ENCOUNTER — Ambulatory Visit
Admission: RE | Admit: 2018-04-05 | Discharge: 2018-04-05 | Disposition: A | Discharge status: Home / Self Care | Payer: BLUE CROSS/BLUE SHIELD | Source: Ambulatory Visit | Attending: Radiation Oncology | Admitting: Radiation Oncology

## 2018-04-05 DIAGNOSIS — Z510 Encounter for antineoplastic radiation therapy: Secondary | ICD-10-CM | POA: Diagnosis not present

## 2018-04-08 ENCOUNTER — Ambulatory Visit
Admission: RE | Admit: 2018-04-08 | Discharge: 2018-04-08 | Disposition: A | Discharge status: Home / Self Care | Payer: BLUE CROSS/BLUE SHIELD | Source: Ambulatory Visit | Attending: Radiation Oncology | Admitting: Radiation Oncology

## 2018-04-08 DIAGNOSIS — Z510 Encounter for antineoplastic radiation therapy: Secondary | ICD-10-CM | POA: Diagnosis not present

## 2018-04-09 ENCOUNTER — Ambulatory Visit
Admission: RE | Admit: 2018-04-09 | Discharge: 2018-04-09 | Disposition: A | Discharge status: Home / Self Care | Payer: BLUE CROSS/BLUE SHIELD | Source: Ambulatory Visit | Attending: Radiation Oncology | Admitting: Radiation Oncology

## 2018-04-09 DIAGNOSIS — Z510 Encounter for antineoplastic radiation therapy: Secondary | ICD-10-CM | POA: Diagnosis not present

## 2018-04-10 ENCOUNTER — Ambulatory Visit
Admission: RE | Admit: 2018-04-10 | Discharge: 2018-04-10 | Disposition: A | Discharge status: Home / Self Care | Payer: BLUE CROSS/BLUE SHIELD | Source: Ambulatory Visit | Attending: Radiation Oncology | Admitting: Radiation Oncology

## 2018-04-10 ENCOUNTER — Inpatient Hospital Stay: Payer: BLUE CROSS/BLUE SHIELD

## 2018-04-10 ENCOUNTER — Inpatient Hospital Stay (HOSPITAL_BASED_OUTPATIENT_CLINIC_OR_DEPARTMENT_OTHER): Payer: BLUE CROSS/BLUE SHIELD | Admitting: Oncology

## 2018-04-10 ENCOUNTER — Other Ambulatory Visit: Payer: Self-pay

## 2018-04-10 ENCOUNTER — Encounter: Payer: Self-pay | Admitting: Oncology

## 2018-04-10 VITALS — BP 142/89 | HR 110 | Temp 97.9°F | Resp 18 | Wt 203.8 lb

## 2018-04-10 DIAGNOSIS — I129 Hypertensive chronic kidney disease with stage 1 through stage 4 chronic kidney disease, or unspecified chronic kidney disease: Secondary | ICD-10-CM | POA: Diagnosis not present

## 2018-04-10 DIAGNOSIS — C109 Malignant neoplasm of oropharynx, unspecified: Secondary | ICD-10-CM

## 2018-04-10 DIAGNOSIS — N183 Chronic kidney disease, stage 3 unspecified: Secondary | ICD-10-CM

## 2018-04-10 DIAGNOSIS — G893 Neoplasm related pain (acute) (chronic): Secondary | ICD-10-CM

## 2018-04-10 DIAGNOSIS — D6481 Anemia due to antineoplastic chemotherapy: Secondary | ICD-10-CM | POA: Diagnosis not present

## 2018-04-10 DIAGNOSIS — Z5111 Encounter for antineoplastic chemotherapy: Secondary | ICD-10-CM

## 2018-04-10 DIAGNOSIS — R1310 Dysphagia, unspecified: Secondary | ICD-10-CM

## 2018-04-10 DIAGNOSIS — Z510 Encounter for antineoplastic radiation therapy: Secondary | ICD-10-CM | POA: Diagnosis not present

## 2018-04-10 DIAGNOSIS — T451X5A Adverse effect of antineoplastic and immunosuppressive drugs, initial encounter: Secondary | ICD-10-CM

## 2018-04-10 LAB — CBC WITH DIFFERENTIAL/PLATELET
BASOS ABS: 0.1 10*3/uL (ref 0–0.1)
BASOS PCT: 1 %
EOS ABS: 0.2 10*3/uL (ref 0–0.7)
EOS PCT: 2 %
HCT: 29.7 % — ABNORMAL LOW (ref 40.0–52.0)
Hemoglobin: 10.6 g/dL — ABNORMAL LOW (ref 13.0–18.0)
Lymphocytes Relative: 6 %
Lymphs Abs: 0.4 10*3/uL — ABNORMAL LOW (ref 1.0–3.6)
MCH: 34 pg (ref 26.0–34.0)
MCHC: 35.6 g/dL (ref 32.0–36.0)
MCV: 95.6 fL (ref 80.0–100.0)
MONO ABS: 0.3 10*3/uL (ref 0.2–1.0)
MONOS PCT: 5 %
NEUTROS ABS: 5.5 10*3/uL (ref 1.4–6.5)
Neutrophils Relative %: 86 %
PLATELETS: 248 10*3/uL (ref 150–440)
RBC: 3.11 MIL/uL — ABNORMAL LOW (ref 4.40–5.90)
RDW: 19.6 % — AB (ref 11.5–14.5)
WBC: 6.4 10*3/uL (ref 3.8–10.6)

## 2018-04-10 LAB — COMPREHENSIVE METABOLIC PANEL
ALBUMIN: 3.6 g/dL (ref 3.5–5.0)
ALT: 14 U/L — ABNORMAL LOW (ref 17–63)
AST: 23 U/L (ref 15–41)
Alkaline Phosphatase: 87 U/L (ref 38–126)
Anion gap: 11 (ref 5–15)
BILIRUBIN TOTAL: 0.8 mg/dL (ref 0.3–1.2)
BUN: 25 mg/dL — ABNORMAL HIGH (ref 6–20)
CHLORIDE: 106 mmol/L (ref 101–111)
CO2: 24 mmol/L (ref 22–32)
Calcium: 9.4 mg/dL (ref 8.9–10.3)
Creatinine, Ser: 1.7 mg/dL — ABNORMAL HIGH (ref 0.61–1.24)
GFR calc Af Amer: 48 mL/min — ABNORMAL LOW (ref >60)
GFR calc non Af Amer: 41 mL/min — ABNORMAL LOW (ref >60)
GLUCOSE: 139 mg/dL — AB (ref 65–99)
POTASSIUM: 4.2 mmol/L (ref 3.5–5.1)
Sodium: 141 mmol/L (ref 135–145)
TOTAL PROTEIN: 7.1 g/dL (ref 6.5–8.1)

## 2018-04-10 MED ORDER — OXYCODONE-ACETAMINOPHEN 5-325 MG PO TABS: 1.0000 | ORAL_TABLET | Freq: Three times a day (TID) | ORAL | 0 refills | Status: DC | PRN

## 2018-04-10 MED ORDER — DIPHENHYDRAMINE HCL 50 MG/ML IJ SOLN
50.0000 mg | Freq: Once | INTRAMUSCULAR | Status: AC
  Administered 2018-04-10: 50 mg via INTRAVENOUS
  Filled 2018-04-10: qty 1

## 2018-04-10 MED ORDER — SODIUM CHLORIDE 0.9 % IV SOLN
Freq: Once | INTRAVENOUS | Status: AC
Start: 2018-04-10 — End: 2018-04-10
  Administered 2018-04-10: 09:00:00 via INTRAVENOUS
  Filled 2018-04-10: qty 1000

## 2018-04-10 MED ORDER — SODIUM CHLORIDE 0.9 % IV SOLN
180.0000 mg | Freq: Once | INTRAVENOUS | Status: AC
  Administered 2018-04-10: 180 mg via INTRAVENOUS
  Filled 2018-04-10: qty 18

## 2018-04-10 MED ORDER — SODIUM CHLORIDE 0.9 % IV SOLN
45.0000 mg/m2 | Freq: Once | INTRAVENOUS | Status: AC
  Administered 2018-04-10: 96 mg via INTRAVENOUS
  Filled 2018-04-10: qty 16

## 2018-04-10 MED ORDER — HEPARIN SOD (PORK) LOCK FLUSH 100 UNIT/ML IV SOLN
500.0000 [IU] | Freq: Once | INTRAVENOUS | Status: AC | PRN
  Administered 2018-04-10: 500 [IU]
  Filled 2018-04-10: qty 5

## 2018-04-10 MED ORDER — PALONOSETRON HCL INJECTION 0.25 MG/5ML
0.2500 mg | Freq: Once | INTRAVENOUS | Status: AC
  Administered 2018-04-10: 0.25 mg via INTRAVENOUS
  Filled 2018-04-10: qty 5

## 2018-04-10 MED ORDER — SODIUM CHLORIDE 0.9 % IV SOLN
Freq: Once | INTRAVENOUS | Status: AC
  Administered 2018-04-10: 09:00:00 via INTRAVENOUS
  Filled 2018-04-10: qty 1000

## 2018-04-10 MED ORDER — HEPARIN SOD (PORK) LOCK FLUSH 100 UNIT/ML IV SOLN: 500.0000 [IU] | Freq: Once | INTRAVENOUS | Status: DC

## 2018-04-10 MED ORDER — DEXAMETHASONE SODIUM PHOSPHATE 10 MG/ML IJ SOLN
10.0000 mg | Freq: Once | INTRAMUSCULAR | Status: AC
  Administered 2018-04-10: 10 mg via INTRAVENOUS
  Filled 2018-04-10: qty 1

## 2018-04-10 MED ORDER — SODIUM CHLORIDE 0.9% FLUSH
10.0000 mL | Freq: Once | INTRAVENOUS | Status: AC
  Administered 2018-04-10: 10 mL via INTRAVENOUS
  Filled 2018-04-10: qty 10

## 2018-04-10 NOTE — Progress Notes (Signed)
Hematology/Oncology  Follow Up note [facility] [phone] Fax:([phone]   Patient Care Team: Albina Billet, MD as PCP - General (Internal Medicine) Albina Billet, MD (Internal Medicine) Bary Castilla Forest Gleason, MD (General Surgery)  REFERRING PROVIDER: Dr.McQueen CHIEF COMPLAINTS/PURPOSE OF CONSULTATION:  Evaluation of newly diagnosed head and neck cancer.  HISTORY OF PRESENTING ILLNESS:  Ivan Garcia. is a  62 y.o.  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 Tami Ribas. With ENT examination, he was found to have midline tongue base is to come down to 2 Weekly which is 40 mg pedunculated mass, 3cm.  And had CT soft tissue neck which confirmed pronounced lymphadenopathy in the right neck including conglomerate partially necrotic nodal mass spanning level III, 4 and 5, measuring 5.2 x 5.5 x 3.7 cm. Additional pathological nodes and right level 2 about 2.5 cm. There is asymmetry in the right tonsillar region. # Biopsy of the right neck mass and pathology revealed squamous cancer. P16 positive.  Current Cancer Treatment Cisplatin 100 mg/m2 every 3 weeks with concurrent RT which started on 12/31/2017.  S/p one dose of Cisplatin. Cisplatin discontinued due to nephrotoxicity.  Switch to weekly Carboplatin (AUC 2) / Taxol 45mg /m2    INTERVAL HISTORY Ivan Garcia Ivan Garcia. is a 62 y.o. male who presents for follow up of head and neck squamous cancer. He was restarted on radiation.  Odynophagia: worsened after resumed RT. continue have swallowing pain.  He takes oxycodone 5 mg every 8 hours as needed with partial relief.  Leg swelling has improved after he switched to low-salt diet.  He has nephrologist appointment. Resolved after switching to low-salt diet.. Nephrologist appointment in May.  Denies fever chill, cough, abdominal pain.   Review of Systems  Constitutional: Negative for  chills, diaphoresis, fever, malaise/fatigue and weight loss.  HENT: Negative for congestion, ear discharge, ear pain, hearing loss, nosebleeds, sinus pain, sore throat and tinnitus.        Pain with swallowing.   Eyes: Negative for blurred vision, double vision, photophobia, pain, discharge and redness.  Respiratory: Negative for cough, hemoptysis, sputum production, shortness of breath and wheezing.   Cardiovascular: Negative for chest pain, palpitations, orthopnea, claudication and leg swelling.  Gastrointestinal: Negative for abdominal pain, blood in stool, constipation, diarrhea, heartburn, melena, nausea and vomiting.  Genitourinary: Negative for dysuria, flank pain, frequency and hematuria.  Musculoskeletal: Negative for back pain, joint pain, myalgias and neck pain.  Skin: Negative for itching and rash.  Neurological: Negative for dizziness, tingling, tremors, sensory change, speech change, focal weakness, weakness and headaches.  Endo/Heme/Allergies: Negative for environmental allergies and polydipsia. Does not bruise/bleed easily.  Psychiatric/Behavioral: Negative for depression, hallucinations, substance abuse and suicidal ideas. The patient is not nervous/anxious.     MEDICAL HISTORY:  Past Medical History:  Diagnosis Date  . Arthritis   . Cancer ([place])    Head and neck cancer  . Hemorrhoids   . Hyperlipidemia   . Hypertension     SURGICAL HISTORY: Past Surgical History:  Procedure Laterality Date  . COLONOSCOPY WITH PROPOFOL N/A 04/04/2017   Procedure: COLONOSCOPY WITH PROPOFOL;  Surgeon: Robert Bellow, MD;  Location: [facility] ENDOSCOPY;  Service: Endoscopy;  Laterality: N/A;  . MANDIBLE SURGERY    . PORTA CATH INSERTION N/A 12/19/2017   Procedure: PORTA CATH INSERTION;  Surgeon: Algernon Huxley, MD;  Location: [place] CV LAB;  Service: Cardiovascular;  Laterality: N/A;  . TONSILLECTOMY      SOCIAL HISTORY: Social History   Socioeconomic History  . Marital status:  Single    Spouse name: Not on file  . Number of children: Not on file  . Years of education: Not on file  . Highest education level: Not on file  Occupational History  . Not on file  Social Needs  . Financial resource strain: Not on file  . Food insecurity:    Worry: Not on file    Inability: Not on file  . Transportation needs:    Medical: Not on file    Non-medical: Not on file  Tobacco Use  . Smoking status: Never Smoker  . Smokeless tobacco: Never Used  Substance and Sexual Activity  . Alcohol use: No  . Drug use: No  . Sexual activity: Not on file  Lifestyle  . Physical activity:    Days per week: Not on file    Minutes per session: Not on file  . Stress: Not on file  Relationships  . Social connections:    Talks on phone: Not on file    Gets together: Not on file    Attends religious service: Not on file    Active member of club or organization: Not on file    Attends meetings of clubs or organizations: Not on file    Relationship status: Not on file  . Intimate partner violence:    Fear of current or ex partner: Not on file    Emotionally abused: Not on file    Physically abused: Not on file    Forced sexual activity: Not on file  Other Topics Concern  . Not on file  Social History Narrative  . Not on file    FAMILY HISTORY: Family History  Problem Relation Age of Onset  . Breast cancer Mother   . Asthma Mother   . Congestive Heart Failure Mother   . Prostate cancer Father   . Brain cancer Father   . Bladder Cancer Father     ALLERGIES:  has No Known Allergies.  MEDICATIONS:  Current Outpatient Medications  Medication Sig Dispense Refill  . amLODipine (NORVASC) 2.5 MG tablet Take 1 tablet (2.5 mg total) by mouth daily. 30 tablet 0  . lansoprazole (PREVACID) 30 MG capsule Take 1 capsule (30 mg total) by mouth daily at 12 noon. 30 capsule 4  . lidocaine (XYLOCAINE) 2 % solution Use as directed 20 mLs in the mouth or throat every 4 (four) hours as  needed for mouth pain (throat pain). (Patient not taking: Reported on 04/03/2018) 300 mL 0  . Multiple Vitamins-Minerals (MULTIVITAMIN WITH MINERALS) tablet Take 1 tablet by mouth daily.    . ondansetron (ZOFRAN) 8 MG tablet TAKE 1 TABLET (8 MG TOTAL) BY MOUTH 2 (TWO) TIMES DAILY AS NEEDED.  1  . oxyCODONE-acetaminophen (PERCOCET/ROXICET) 5-325 MG tablet Take 1 tablet by mouth every 8 (eight) hours as needed for severe pain. 30 tablet 0  . rosuvastatin (CRESTOR) 10 MG tablet Take 10 mg by mouth every evening.  4   No current facility-administered medications for this visit.    Facility-Administered Medications Ordered in Other Visits  Medication Dose Route Frequency Provider Last Rate Last Dose  . heparin lock flush 100 unit/mL  500 Units Intravenous Once Earlie Server, MD         PHYSICAL EXAMINATION: ECOG PERFORMANCE STATUS: 1 - Symptomatic but completely ambulatory Vitals:   04/10/18 0849  BP: (!) 142/89  Pulse: (!) 110  Resp: 18  Temp: 97.9 F (36.6 C)   Physical Exam  Constitutional: He is oriented to person, place, and time and well-developed, well-nourished, and in no distress. No distress.  HENT:  Head: Normocephalic and atraumatic.  Right Ear: External ear normal.  Left Ear: External ear normal.  Nose: Nose normal.  Mouth/Throat: Oropharynx is clear and moist. No oropharyngeal exudate.  No thrush Dry oral mucosa  Eyes: Pupils are equal, round, and reactive to light. Conjunctivae and EOM are normal. Left eye exhibits no discharge. No scleral icterus.  Neck: Normal range of motion. Neck supple. No JVD present. No tracheal deviation present. No thyromegaly present.  Right side neck mass has significantly decreased in size..  Cardiovascular: Normal rate, regular rhythm and normal heart sounds. Exam reveals no gallop and no friction rub.  No murmur heard. Pulmonary/Chest: Effort normal and breath sounds normal. No respiratory distress. He has no wheezes. He has no rales. He  exhibits no tenderness.  Abdominal: Soft. Bowel sounds are normal. He exhibits no distension and no mass. There is no tenderness. There is no rebound and no guarding.  Musculoskeletal: Normal range of motion. He exhibits no edema, tenderness or deformity.  Bilateral lower extremity edema, 1+  Lymphadenopathy:    He has cervical adenopathy.  Neurological: He is alert and oriented to person, place, and time. No cranial nerve deficit. He exhibits normal muscle tone. Gait normal. Coordination normal.  Skin: Skin is warm and dry. No rash noted. He is not diaphoretic. No erythema. No pallor.  Psychiatric: Mood, memory, affect and judgment normal.     LABORATORY DATA:  I have reviewed the data as listed Lab Results  Component Value Date   WBC 6.4 04/10/2018   HGB 10.6 (L) 04/10/2018   HCT 29.7 (L) 04/10/2018   MCV 95.6 04/10/2018   PLT 248 04/10/2018   Recent Labs    03/27/18 0835 04/03/18 0809 04/10/18 0809  NA 139 140 141  K 4.1 4.5 4.2  CL 107 109 106  CO2 24 23 24   GLUCOSE 107* 94 139*  BUN 28* 27* 25*  CREATININE 1.66* 1.47* 1.70*  CALCIUM 9.1 9.4 9.4  GFRNONAA 43* 49* 41*  GFRAA 49* 57* 48*  PROT 6.5 6.6 7.1  ALBUMIN 3.3* 3.4* 3.6  AST 23 18 23   ALT 17 15* 14*  ALKPHOS 75 75 87  BILITOT 0.8 0.5 0.8     RADIOGRAPHIC STUDIES: I have personally reviewed the radiological images as listed and agreed with the findings in the report. 03/08/2018  Near complete resolution of prior large right neck mass.Small right cervical/supraclavicular nodal metastases measuring up to 10 mm short axis. No evidence of distal metastases.  ASSESSMENT & PLAN:  Clinically he has cT2 cN2 disease, stage II, p16 positive. 1. 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[place])   2. Encounter for antineoplastic chemotherapy   3. Stage 3 chronic kidney disease ([place])   4. Odynophagia   5. Neoplasm related pain   6. Anemia due to chemotherapy    # Proceed with today's  weekly Carboplatin (AUC 2) / Taxol  23mmg/m2 he is finishing his radiation course today.  Dr. Donella Stade plans to repeat CT scan in 4 weeks.  # Odynophagia, due to RT,continue oxycodone 5mg  Q8h as needed for pain, refill 1 week supply. # Edema secondary to CKD, resolved.  Avoid NSAIDs.  Discussed with patient. #Anemia due to radiation and chemotherapy.  Anticipate it to improve after finishing chemotherapy and radiation. All  questions were answered. The patient knows to call the clinic with any problems questions or concerns.  Return of visit: 6 weeks for reassessment.  Earlie Server, MD, PhD Hematology Oncology [facility] Pager- [phone] 04/10/2018

## 2018-04-10 NOTE — Progress Notes (Signed)
Patient here today for follow up.   

## 2018-05-15 ENCOUNTER — Ambulatory Visit
Admission: RE | Admit: 2018-05-15 | Discharge: 2018-05-15 | Disposition: A | Discharge status: Home / Self Care | Payer: BLUE CROSS/BLUE SHIELD | Source: Ambulatory Visit | Attending: Radiation Oncology | Admitting: Radiation Oncology

## 2018-05-15 ENCOUNTER — Other Ambulatory Visit: Payer: Self-pay

## 2018-05-15 ENCOUNTER — Encounter: Payer: Self-pay | Admitting: Radiation Oncology

## 2018-05-15 VITALS — BP 125/95 | HR 101 | Temp 96.2°F | Resp 20 | Wt 192.4 lb

## 2018-05-15 DIAGNOSIS — C760 Malignant neoplasm of head, face and neck: Secondary | ICD-10-CM | POA: Insufficient documentation

## 2018-05-15 DIAGNOSIS — Z923 Personal history of irradiation: Secondary | ICD-10-CM | POA: Diagnosis not present

## 2018-05-15 DIAGNOSIS — C109 Malignant neoplasm of oropharynx, unspecified: Secondary | ICD-10-CM

## 2018-05-15 DIAGNOSIS — Z9221 Personal history of antineoplastic chemotherapy: Secondary | ICD-10-CM | POA: Diagnosis not present

## 2018-05-15 DIAGNOSIS — R1310 Dysphagia, unspecified: Secondary | ICD-10-CM | POA: Diagnosis not present

## 2018-05-15 NOTE — Progress Notes (Signed)
Radiation Oncology Follow up Note  Name: Ivan Garcia [facility].   Date:   05/15/2018 MRN:  [id number] DOB: 1956/10/10    This 62 y.o. 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  REFERRING PROVIDER: Albina Billet, MD  HPI: patient is a 62 year old male now out 1 month having completed concurrent chemoradiation therapy for locally advanced squamous cell carcinoma 16 positive of the base of tongue. We treated him with adaptive therapy since he had large mass of nodes in his right supraclavicular fossa. He is seen today in routine follow-up is doing fairly well. He does have some mild dysphagia no head and neck pain. He has not any further imaging at this time..  COMPLICATIONS OF TREATMENT: none  FOLLOW UP COMPLIANCE: keeps appointments   PHYSICAL EXAM:  BP (!) 125/95   Pulse (!) 101   Temp (!) 96.2 F (35.7 C)   Resp 20   Wt 192 lb 5.6 oz (87.2 kg)   BMI 27.60 kg/m  Oral cavity is clear no oral mucosal lesions are identified. Indirect mirror examination shows upper airway clear vallecula and base of tongue within normal limits. No evidence of subject gastric cervical or supraclavicular adenopathy there is some slight fullness still present in the right supraclavicular fossa. Well-developed well-nourished patient in NAD. HEENT reveals PERLA, EOMI, discs not visualized.  Oral cavity is clear. No oral mucosal lesions are identified. Neck is clear without evidence of cervical or supraclavicular adenopathy. Lungs are clear to A&P. Cardiac examination is essentially unremarkable with regular rate and rhythm without murmur rub or thrill. Abdomen is benign with no organomegaly or masses noted. Motor sensory and DTR levels are equal and symmetric in the upper and lower extremities. Cranial nerves II through XII are grossly intact.  Proprioception is intact. No peripheral adenopathy or edema is identified. No motor or sensory levels are noted. Crude visual fields are within normal range.  RADIOLOGY RESULTS: no current films for review  PLAN: present time patient is doing well I've asked him to make a follow-up appointment with Dr. Tami Ribas. He has follow-up appointments with medical oncology. Would like to see a PET CT scan in about a month to 2 monthsfor evaluation of treatment. I've asked to see him back in 3 months for follow-up. Patient knows to call sooner with any concerns.  I would like to take this opportunity to thank you for allowing me to participate in the care of your patient.Noreene Filbert, MD

## 2018-05-24 ENCOUNTER — Inpatient Hospital Stay: Payer: BLUE CROSS/BLUE SHIELD | Attending: Oncology

## 2018-05-24 ENCOUNTER — Encounter: Payer: Self-pay | Admitting: Oncology

## 2018-05-24 ENCOUNTER — Inpatient Hospital Stay (HOSPITAL_BASED_OUTPATIENT_CLINIC_OR_DEPARTMENT_OTHER): Payer: BLUE CROSS/BLUE SHIELD | Admitting: Oncology

## 2018-05-24 ENCOUNTER — Other Ambulatory Visit: Payer: Self-pay

## 2018-05-24 VITALS — BP 128/85 | HR 91 | Temp 97.7°F | Wt 194.0 lb

## 2018-05-24 DIAGNOSIS — T451X5A Adverse effect of antineoplastic and immunosuppressive drugs, initial encounter: Secondary | ICD-10-CM | POA: Diagnosis not present

## 2018-05-24 DIAGNOSIS — I129 Hypertensive chronic kidney disease with stage 1 through stage 4 chronic kidney disease, or unspecified chronic kidney disease: Secondary | ICD-10-CM | POA: Insufficient documentation

## 2018-05-24 DIAGNOSIS — N183 Chronic kidney disease, stage 3 unspecified: Secondary | ICD-10-CM

## 2018-05-24 DIAGNOSIS — C109 Malignant neoplasm of oropharynx, unspecified: Secondary | ICD-10-CM

## 2018-05-24 DIAGNOSIS — R1310 Dysphagia, unspecified: Secondary | ICD-10-CM | POA: Diagnosis not present

## 2018-05-24 DIAGNOSIS — D6481 Anemia due to antineoplastic chemotherapy: Secondary | ICD-10-CM | POA: Insufficient documentation

## 2018-05-24 DIAGNOSIS — Z95828 Presence of other vascular implants and grafts: Secondary | ICD-10-CM

## 2018-05-24 LAB — CBC WITH DIFFERENTIAL/PLATELET
BASOS ABS: 0 10*3/uL (ref 0–0.1)
Basophils Relative: 1 %
Eosinophils Absolute: 0.1 10*3/uL (ref 0–0.7)
Eosinophils Relative: 2 %
HEMATOCRIT: 33.8 % — AB (ref 40.0–52.0)
Hemoglobin: 11.9 g/dL — ABNORMAL LOW (ref 13.0–18.0)
Lymphocytes Relative: 11 %
Lymphs Abs: 0.4 10*3/uL — ABNORMAL LOW (ref 1.0–3.6)
MCH: 33 pg (ref 26.0–34.0)
MCHC: 35.3 g/dL (ref 32.0–36.0)
MCV: 93.5 fL (ref 80.0–100.0)
Monocytes Absolute: 0.4 10*3/uL (ref 0.2–1.0)
Monocytes Relative: 10 %
NEUTROS ABS: 3.1 10*3/uL (ref 1.4–6.5)
NEUTROS PCT: 76 %
PLATELETS: 231 10*3/uL (ref 150–440)
RBC: 3.61 MIL/uL — AB (ref 4.40–5.90)
RDW: 14.9 % — ABNORMAL HIGH (ref 11.5–14.5)
WBC: 4.1 10*3/uL (ref 3.8–10.6)

## 2018-05-24 LAB — COMPREHENSIVE METABOLIC PANEL
ALT: 13 U/L — AB (ref 17–63)
AST: 15 U/L (ref 15–41)
Albumin: 3.7 g/dL (ref 3.5–5.0)
Alkaline Phosphatase: 62 U/L (ref 38–126)
Anion gap: 4 — ABNORMAL LOW (ref 5–15)
BILIRUBIN TOTAL: 0.5 mg/dL (ref 0.3–1.2)
BUN: 26 mg/dL — AB (ref 6–20)
CHLORIDE: 109 mmol/L (ref 101–111)
CO2: 25 mmol/L (ref 22–32)
CREATININE: 1.46 mg/dL — AB (ref 0.61–1.24)
Calcium: 9.6 mg/dL (ref 8.9–10.3)
GFR calc Af Amer: 57 mL/min — ABNORMAL LOW (ref >60)
GFR calc non Af Amer: 49 mL/min — ABNORMAL LOW (ref >60)
Glucose, Bld: 129 mg/dL — ABNORMAL HIGH (ref 65–99)
Potassium: 3.5 mmol/L (ref 3.5–5.1)
Sodium: 138 mmol/L (ref 135–145)
Total Protein: 6.7 g/dL (ref 6.5–8.1)

## 2018-05-24 NOTE — Progress Notes (Signed)
Hematology/Oncology  Follow Up note [facility] Telephone:(336) [phone] Fax:([phone]   Patient Care Team: Albina Billet, MD as PCP - General (Internal Medicine) Albina Billet, MD (Internal Medicine) Bary Castilla Forest Gleason, MD (General Surgery)  REFERRING PROVIDER: Dr.McQueen CHIEF COMPLAINTS/PURPOSE OF CONSULTATION:  Evaluation of newly diagnosed head and neck cancer.  HISTORY OF PRESENTING ILLNESS:  Ivan Doswell. is a  62 y.o.  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 P16 positive. # Concurrent ChemoRT  Cisplatin 100 mg/m2 q3weeks with concurrent RT which started on 12/31/2017.  S/p one dose of Cisplatin. Cisplatin discontinued due to nephrotoxicity. Switch to weekly Carboplatin (AUC 2) / Taxol 45mg /m2 [ finished May 2019]   INTERVAL HISTORY Ivan Covelli Satoshi Kalas. is a 62 y.o. male who presents for follow up of Stage II squamous cell carcinoma, p16 positive.  Finished concurrent chemoRT.   Odynophagia: improved since finishing treatment. Still has mild dysphagia.   Leg swelling: improved after switch to low salt diet.   Dry mouth: uses biotene oral rinse.   Review of Systems  Constitutional: Negative for chills, diaphoresis, fever, malaise/fatigue and weight loss.  HENT: Negative for congestion, ear discharge, ear pain, hearing loss, nosebleeds, sinus pain, sore throat and tinnitus.   Eyes: Negative for blurred vision, double vision, photophobia, pain, discharge and redness.  Respiratory: Negative for cough, hemoptysis, sputum production, shortness of breath and wheezing.   Cardiovascular: Negative for chest pain, palpitations, orthopnea, claudication and leg swelling.  Gastrointestinal: Negative for abdominal pain, blood in stool, constipation, diarrhea, heartburn, melena, nausea and vomiting.  Genitourinary: Negative for dysuria, flank  pain, frequency and hematuria.  Musculoskeletal: Negative for back pain, joint pain, myalgias and neck pain.  Skin: Negative for itching and rash.  Neurological: Negative for dizziness, tingling, tremors, sensory change, speech change, focal weakness, weakness and headaches.  Endo/Heme/Allergies: Negative for environmental allergies and polydipsia. Does not bruise/bleed easily.  Psychiatric/Behavioral: Negative for depression, hallucinations, substance abuse and suicidal ideas. The patient is not nervous/anxious.     MEDICAL HISTORY:  Past Medical History:  Diagnosis Date  . Arthritis   . Cancer ([place])    Head and neck cancer  . Hemorrhoids   . Hyperlipidemia   . Hypertension     SURGICAL HISTORY: Past Surgical History:  Procedure Laterality Date  . COLONOSCOPY WITH PROPOFOL N/A 04/04/2017   Procedure: COLONOSCOPY WITH PROPOFOL;  Surgeon: Robert Bellow, MD;  Location[facility] ENDOSCOPY;  Service: Endoscopy;  Laterality: N/A;  . MANDIBLE SURGERY    . PORTA CATH INSERTION N/A 12/19/2017   Procedure: PORTA CATH INSERTION;  Surgeon: Algernon Huxley, MD;  Location: [place] CV LAB;  Service: Cardiovascular;  Laterality: N/A;  . TONSILLECTOMY      SOCIAL HISTORY: Social History   Socioeconomic History  . Marital status: Single    Spouse name: Not on file  . Number of children: Not on file  . Years of education: Not on file  . Highest education level: Not on file  Occupational History  . Not on file  Social Needs  . Financial resource strain: Not on file  . Food insecurity:    Worry: Not on file    Inability: Not on file  . Transportation needs:    Medical: Not on file    Non-medical: Not on file  Tobacco Use  . Smoking status: Never Smoker  .  Smokeless tobacco: Never Used  Substance and Sexual Activity  . Alcohol use: No  . Drug use: No  . Sexual activity: Not on file  Lifestyle  . Physical activity:    Days per week: Not on file    Minutes per session: Not on file   . Stress: Not on file  Relationships  . Social connections:    Talks on phone: Not on file    Gets together: Not on file    Attends religious service: Not on file    Active member of club or organization: Not on file    Attends meetings of clubs or organizations: Not on file    Relationship status: Not on file  . Intimate partner violence:    Fear of current or ex partner: Not on file    Emotionally abused: Not on file    Physically abused: Not on file    Forced sexual activity: Not on file  Other Topics Concern  . Not on file  Social History Narrative  . Not on file    FAMILY HISTORY: Family History  Problem Relation Age of Onset  . Breast cancer Mother   . Asthma Mother   . Congestive Heart Failure Mother   . Prostate cancer Father   . Brain cancer Father   . Bladder Cancer Father     ALLERGIES:  has No Known Allergies.  MEDICATIONS:  Current Outpatient Medications  Medication Sig Dispense Refill  . amLODipine (NORVASC) 2.5 MG tablet Take 1 tablet (2.5 mg total) by mouth daily. 30 tablet 0  . Multiple Vitamins-Minerals (MULTIVITAMIN WITH MINERALS) tablet Take 1 tablet by mouth daily.    . ondansetron (ZOFRAN) 8 MG tablet TAKE 1 TABLET (8 MG TOTAL) BY MOUTH 2 (TWO) TIMES DAILY AS NEEDED.  1  . oxyCODONE-acetaminophen (PERCOCET/ROXICET) 5-325 MG tablet Take 1 tablet by mouth every 8 (eight) hours as needed for severe pain. 21 tablet 0  . rosuvastatin (CRESTOR) 10 MG tablet Take 10 mg by mouth every evening.  4  . lansoprazole (PREVACID) 30 MG capsule Take 1 capsule (30 mg total) by mouth daily at 12 noon. (Patient not taking: Reported on 05/24/2018) 30 capsule 4  . lidocaine (XYLOCAINE) 2 % solution Use as directed 20 mLs in the mouth or throat every 4 (four) hours as needed for mouth pain (throat pain). (Patient not taking: Reported on 04/03/2018) 300 mL 0   No current facility-administered medications for this visit.      PHYSICAL EXAMINATION: ECOG PERFORMANCE  STATUS: 0 - Asymptomatic Vitals:   05/24/18 1352  BP: 128/85  Pulse: 91  Temp: 97.7 F (36.5 C)   Physical Exam  Constitutional: He is oriented to person, place, and time and well-developed, well-nourished, and in no distress. No distress.  HENT:  Head: Normocephalic and atraumatic.  Right Ear: External ear normal.  Left Ear: External ear normal.  Nose: Nose normal.  Mouth/Throat: Oropharynx is clear and moist. No oropharyngeal exudate.  No thrush Dry oral mucosa  Eyes: Pupils are equal, round, and reactive to light. Conjunctivae and EOM are normal. Left eye exhibits no discharge. No scleral icterus.  Neck: Normal range of motion. Neck supple. No JVD present. No tracheal deviation present. No thyromegaly present.  Right side neck mass has resolved.  Cardiovascular: Normal rate, regular rhythm and normal heart sounds. Exam reveals no gallop and no friction rub.  No murmur heard. Pulmonary/Chest: Effort normal and breath sounds normal. No respiratory distress. He has no wheezes. He  has no rales. He exhibits no tenderness.  Abdominal: Soft. Bowel sounds are normal. He exhibits no distension and no mass. There is no tenderness. There is no rebound and no guarding.  Musculoskeletal: Normal range of motion. He exhibits no edema, tenderness or deformity.  Lymphadenopathy:    He has no cervical adenopathy.  Neurological: He is alert and oriented to person, place, and time. No cranial nerve deficit. He exhibits normal muscle tone. Gait normal. Coordination normal.  Skin: Skin is warm and dry. No rash noted. He is not diaphoretic. No erythema. No pallor.  Psychiatric: Affect normal.     LABORATORY DATA:  I have reviewed the data as listed Lab Results  Component Value Date   WBC 4.1 05/24/2018   HGB 11.9 (L) 05/24/2018   HCT 33.8 (L) 05/24/2018   MCV 93.5 05/24/2018   PLT 231 05/24/2018   Recent Labs    04/03/18 0809 04/10/18 0809 05/24/18 1338  NA 140 141 138  K 4.5 4.2 3.5    CL 109 106 109  CO2 23 24 25   GLUCOSE 94 139* 129*  BUN 27* 25* 26*  CREATININE 1.47* 1.70* 1.46*  CALCIUM 9.4 9.4 9.6  GFRNONAA 49* 41* 49*  GFRAA 57* 48* 57*  PROT 6.6 7.1 6.7  ALBUMIN 3.4* 3.6 3.7  AST 18 23 15   ALT 15* 14* 13*  ALKPHOS 75 87 62  BILITOT 0.5 0.8 0.5     RADIOGRAPHIC STUDIES: I have personally reviewed the radiological images as listed and agreed with the findings in the report. 03/08/2018  Near complete resolution of prior large right neck mass.Small right cervical/supraclavicular nodal metastases measuring up to 10 mm short axis. No evidence of distal metastases.  ASSESSMENT & PLAN:  Clinically he has cT2 cN2 disease, stage II, p16 positive. 1. 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[facility])   2. Stage 3 chronic kidney disease ([place])   3. Anemia due to chemotherapy   4. Port-A-Cath in place   5. Dysphagia, unspecified type    # Will repeat PET scan in 6 weeks to access treatment response.   # Dysphagia: improved, still not at baseline. Continue to monitor.   # Edema secondary to CKD, resolved. Avoid nephrotoxin. Marland Kitchen #Anemia due to radiation and chemotherapy.  Improved.   All questions were answered. The patient knows to call the clinic with any problems questions or concerns.  Return of visit: After PET scan.   Earlie Server, MD, PhD Hematology Oncology [facility] Pager- [phone] 05/24/2018

## 2018-05-24 NOTE — Progress Notes (Signed)
Patient here for follow up. No concerns voiced.  °

## 2018-06-11 ENCOUNTER — Ambulatory Visit: Payer: BLUE CROSS/BLUE SHIELD | Admitting: Nurse Practitioner

## 2018-06-11 ENCOUNTER — Ambulatory Visit: Payer: BLUE CROSS/BLUE SHIELD

## 2018-07-02 ENCOUNTER — Ambulatory Visit
Admission: RE | Admit: 2018-07-02 | Discharge: 2018-07-02 | Disposition: A | Discharge status: Home / Self Care | Payer: BLUE CROSS/BLUE SHIELD | Source: Ambulatory Visit | Attending: Oncology | Admitting: Oncology

## 2018-07-02 DIAGNOSIS — C109 Malignant neoplasm of oropharynx, unspecified: Secondary | ICD-10-CM | POA: Insufficient documentation

## 2018-07-02 LAB — GLUCOSE, CAPILLARY: Glucose-Capillary: 89 mg/dL (ref 70–99)

## 2018-07-02 IMAGING — PT NM PET TUM IMG RESTAG (PS) SKULL BASE T - THIGH
10 series · 25 of 25 positions shown · non-contrast
Comparison: [DATE], [DATE]

CLINICAL DATA: Subsequent treatment strategy for head neck
carcinoma. Or pharyngeal carcinoma.

EXAM:
NUCLEAR MEDICINE PET SKULL BASE TO THIGH
TECHNIQUE: 10.35 mCi F-18 FDG was injected intravenously. Full-ring PET imaging
was performed from the skull base to thigh after the radiotracer. CT
data was obtained and used for attenuation correction and anatomic
localization.
Fasting blood glucose: 89 mg/dl

[Series 3: ct wb 5.0 b30f · axial · 5.0mm · 0.98mm/px · z∈[-1164,-180]mm · 3 of 329 slices shown]
[im 1/329]
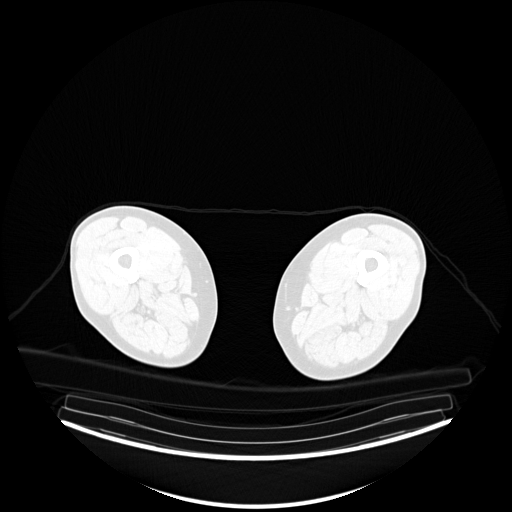
[im 165/329]
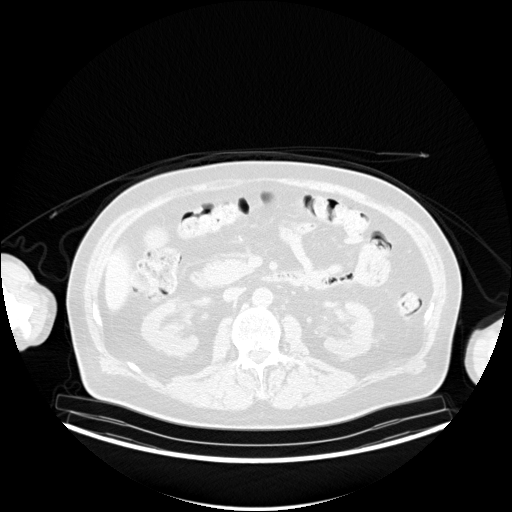
[im 329/329  brain]
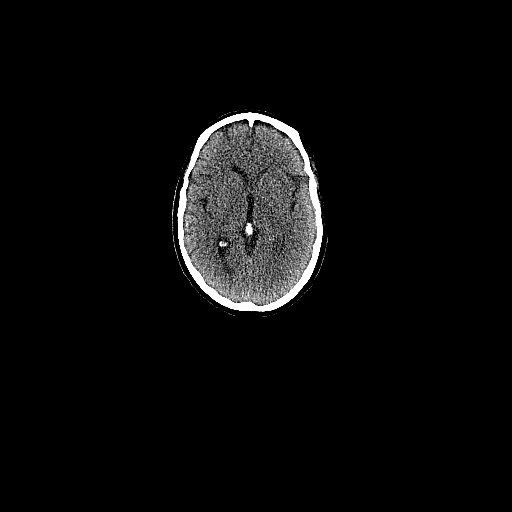

[Series 4: pet wb (ac) · axial · 5.0mm · 2.72mm/px · z∈[-1164,-180]mm · 3 of 329 slices shown]
[im 1/329]
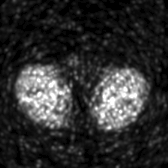
[im 165/329]
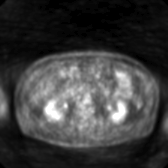
[im 329/329]
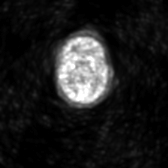

[Series 5: pet wb uncorrected (nac) · axial · 5.0mm · 4.07mm/px · z∈[-1164,-180]mm · 4 of 329 slices shown]
[im 1/329]
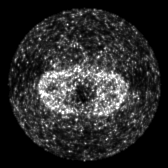
[im 110/329]
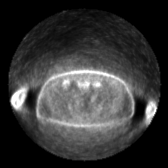
[im 219/329]
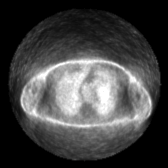
[im 329/329]
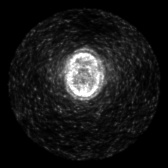

[Series 603: pet axial fused · 4 of 329 slices shown]
[im 1/329]
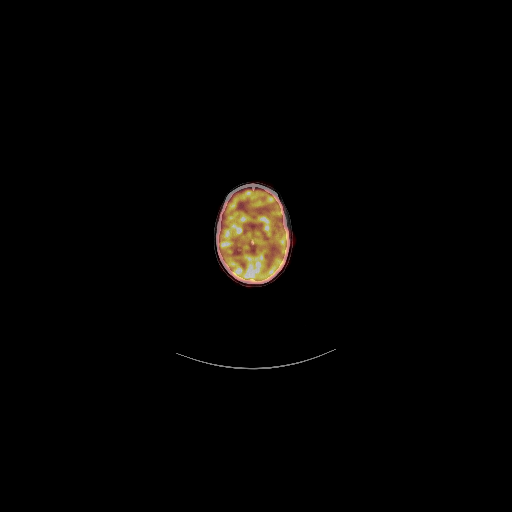
[im 110/329]
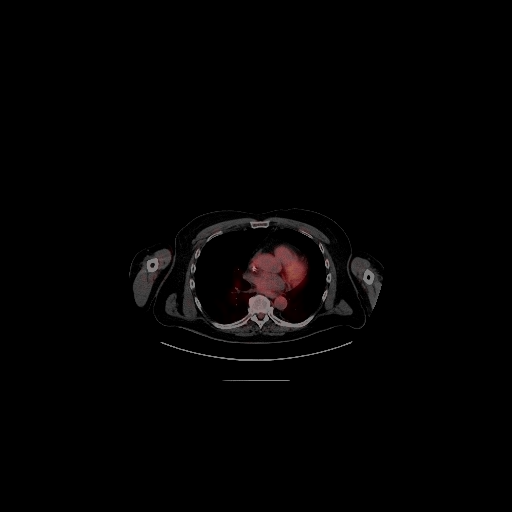
[im 219/329]
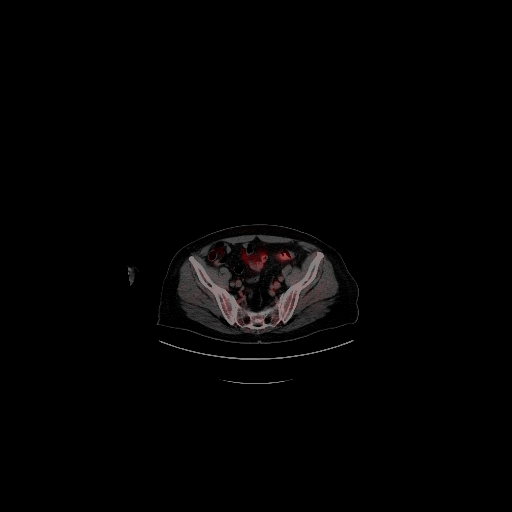
[im 329/329]
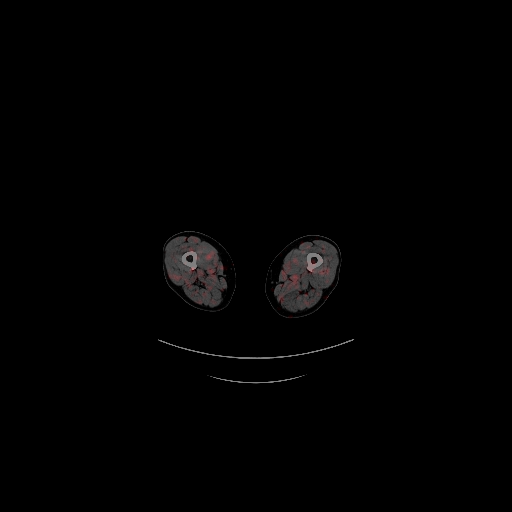

[Series 604: pet coronal fused · 1 of 104 slices shown]
[im 1/104]
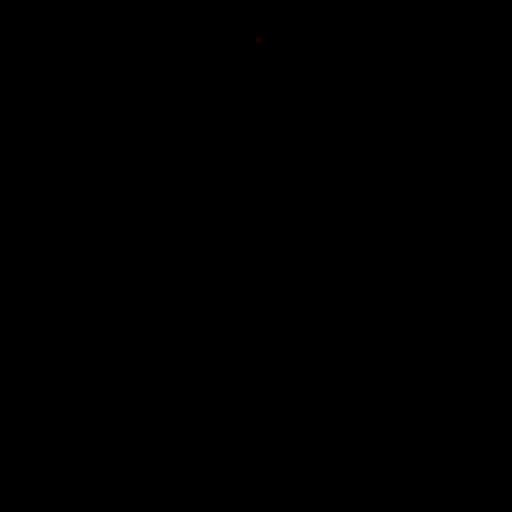

[Series 605: pet sagittal fused · 2 of 148 slices shown]
[im 1/148]
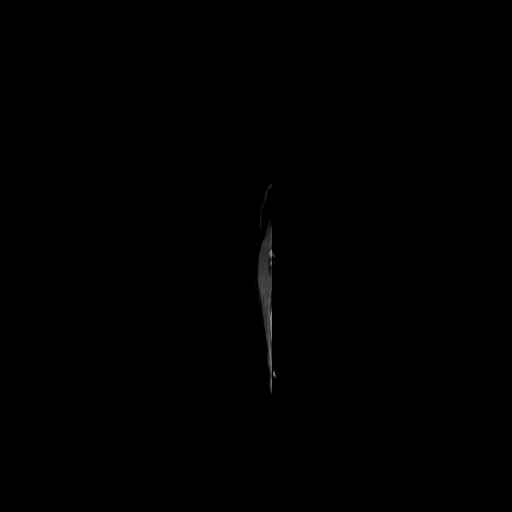
[im 148/148]
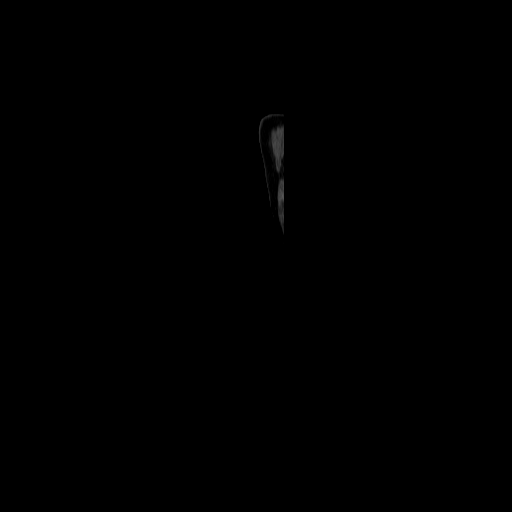

[Series 606: pet axial · 4 of 328 slices shown]
[im 1/328]
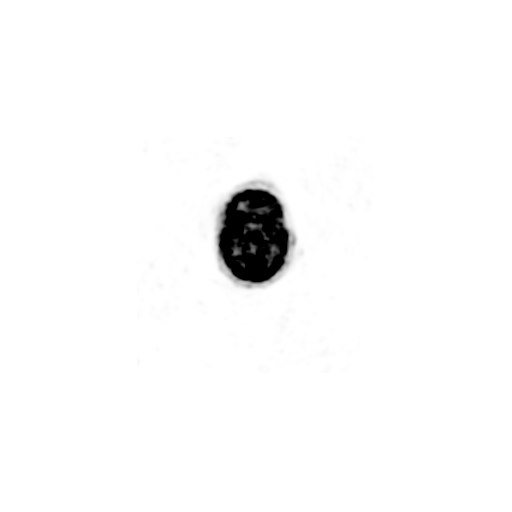
[im 110/328]
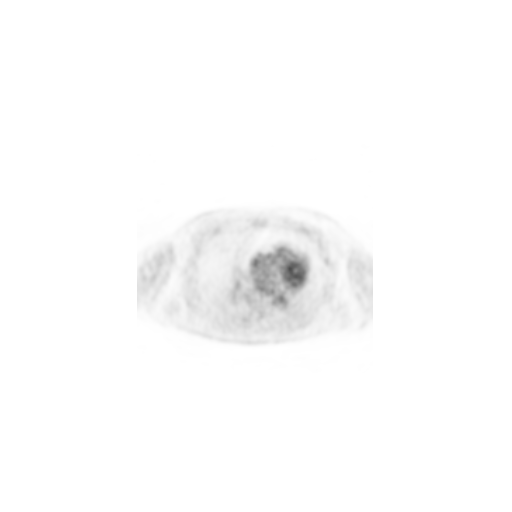
[im 219/328]
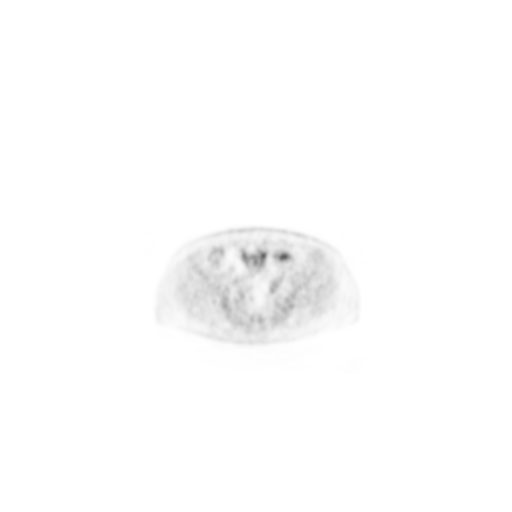
[im 328/328]
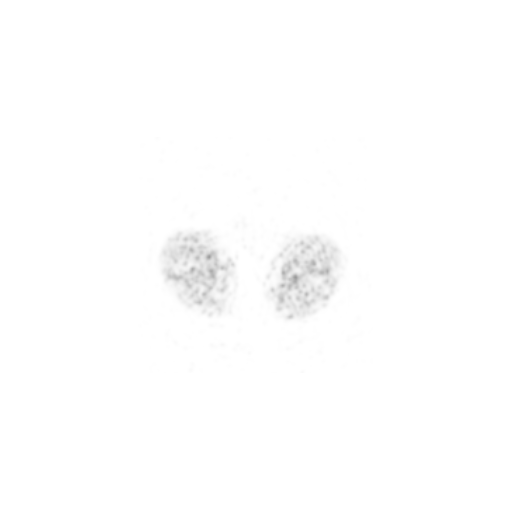

[Series 607: pet coronal · 1 of 122 slices shown]
[im 1/122]
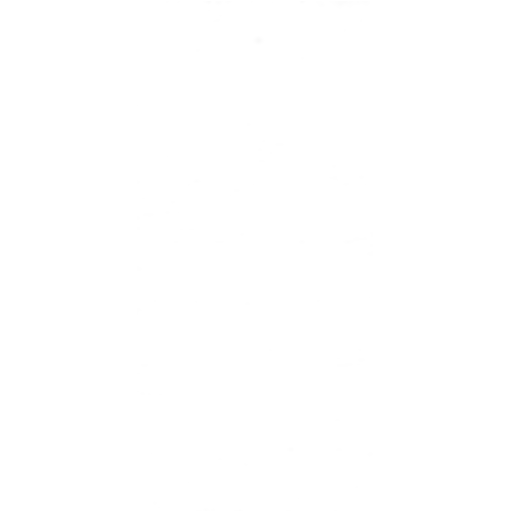

[Series 608: pet sagittal · 2 of 152 slices shown]
[im 1/152]
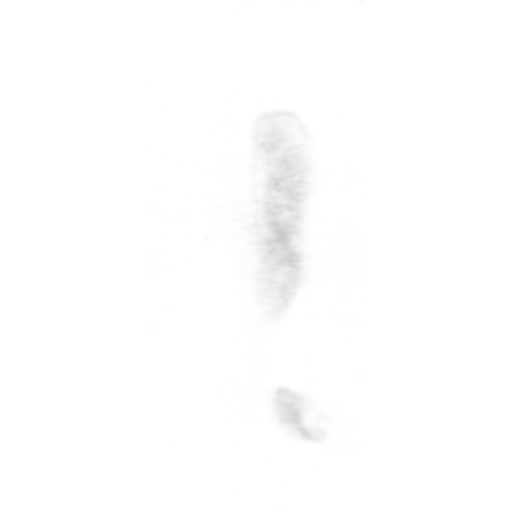
[im 152/152]
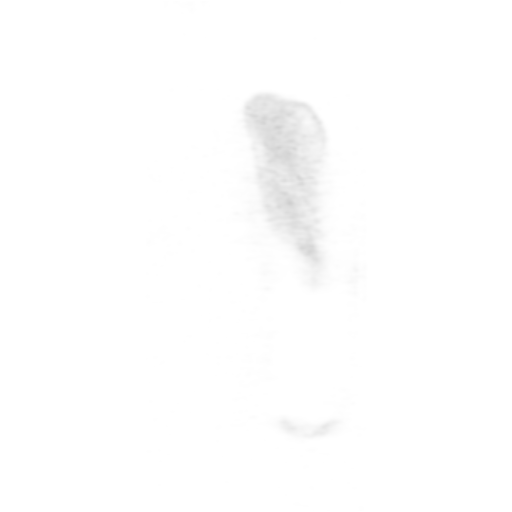

[Series 1270: results mm oncology reading · 1.0mm · 0.89mm/px · 1 of 2 slices shown]
[im 1/2]
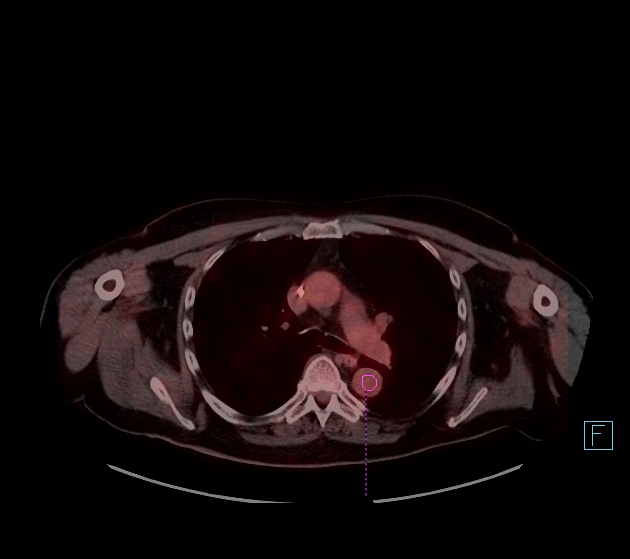

[25 of 25 positions shown; findings below may reference images not displayed]

FINDINGS: Mediastinal blood pool activity: SUV max

NECK: No residual hypermetabolic mass in the RIGHT neck. No
hypermetabolic cervical lymph nodes.

Physiologic activity at the glottis.

Incidental CT findings: none

CHEST: No hypermetabolic mediastinal or hilar nodes. No suspicious
pulmonary nodules on the CT scan.

Incidental CT findings: Port in the anterior chest wall with tip in
distal SVC.

ABDOMEN/PELVIS: No abnormal hypermetabolic activity within the
liver, pancreas, adrenal glands, or spleen. No hypermetabolic lymph
nodes in the abdomen or pelvis.

Incidental CT findings: none

SKELETON: Intense uptake associated with L5-S1 facet joint on the
RIGHT which is favored degenerative

Incidental CT findings: none
IMPRESSION: 1. No evidence of residual carcinoma in the neck. No evidence of
nodal metastasis.
2. No evidence distant metastatic disease.

## 2018-07-02 MED ORDER — FLUDEOXYGLUCOSE F - 18 (FDG) INJECTION
10.3500 | Freq: Once | INTRAVENOUS | Status: AC
  Administered 2018-07-02: 10.35 via INTRAVENOUS

## 2018-07-11 ENCOUNTER — Encounter: Payer: Self-pay | Admitting: Oncology

## 2018-07-11 ENCOUNTER — Other Ambulatory Visit: Payer: Self-pay

## 2018-07-11 ENCOUNTER — Inpatient Hospital Stay: Payer: BLUE CROSS/BLUE SHIELD | Attending: Oncology | Admitting: Oncology

## 2018-07-11 ENCOUNTER — Inpatient Hospital Stay: Payer: BLUE CROSS/BLUE SHIELD

## 2018-07-11 VITALS — BP 134/90 | HR 81 | Temp 97.1°F | Resp 18 | Wt 187.2 lb

## 2018-07-11 DIAGNOSIS — C109 Malignant neoplasm of oropharynx, unspecified: Secondary | ICD-10-CM

## 2018-07-11 DIAGNOSIS — Z95828 Presence of other vascular implants and grafts: Secondary | ICD-10-CM

## 2018-07-11 DIAGNOSIS — Z923 Personal history of irradiation: Secondary | ICD-10-CM | POA: Diagnosis not present

## 2018-07-11 MED ORDER — SODIUM CHLORIDE 0.9% FLUSH
10.0000 mL | INTRAVENOUS | Status: DC | PRN
  Administered 2018-07-11: 10 mL via INTRAVENOUS
  Filled 2018-07-11: qty 10

## 2018-07-11 MED ORDER — HEPARIN SOD (PORK) LOCK FLUSH 100 UNIT/ML IV SOLN
500.0000 [IU] | Freq: Once | INTRAVENOUS | Status: AC
  Administered 2018-07-11: 500 [IU] via INTRAVENOUS

## 2018-07-11 NOTE — Progress Notes (Signed)
Patient here for follow up

## 2018-07-12 NOTE — Progress Notes (Signed)
Hematology/Oncology  Follow Up note [facility] Telephone:([phone]) [phone] Fax:(336) [phone]   Patient Care Team: Albina Billet, MD as PCP - General (Internal Medicine) Albina Billet, MD (Internal Medicine) Bary Castilla, Forest Gleason, MD (General Surgery) Noreene Filbert, MD as Referring Physician (Radiation Oncology) Earlie Server, MD as Consulting Physician (Oncology)  REFERRING PROVIDER: Dr.McQueen CHIEF COMPLAINTS/PURPOSE OF CONSULTATION:  Evaluation of newly diagnosed head and neck cancer.  HISTORY OF PRESENTING ILLNESS:  Ivan Lobdell. is a  63 y.o.  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 P16 positive. # Concurrent ChemoRT  Cisplatin 100 mg/m2 q3weeks with concurrent RT which started on 12/31/2017.  S/p one dose of Cisplatin. Cisplatin discontinued due to nephrotoxicity. Switch to weekly Carboplatin (AUC 2) / Taxol 45mg /m2 [ finished May 2019]   INTERVAL HISTORY Ivan Durfee Aman Bonet. is a 62 y.o. male who presents for follow-up stage II squamous cell carcinoma, P 16+. Patient has finished concurrent chemoradiation.  Odynophagia, much improved since finishing the treatment.  He has mild sore throat occasionally. Lower extremity swelling due to chronic kidney disease, resolved. Dry mouth, ongoing problem for him.  He uses Biotene oral rinse  Review of Systems  Constitutional: Negative for chills, diaphoresis, fever, malaise/fatigue and weight loss.  HENT: Negative for congestion, ear discharge, ear pain, hearing loss, nosebleeds, sinus pain, sore throat and tinnitus.        Dry mouth  Eyes: Negative for blurred vision, double vision, photophobia, pain, discharge and redness.  Respiratory: Negative for cough, hemoptysis, sputum production, shortness of breath and wheezing.   Cardiovascular: Negative for chest pain, palpitations, orthopnea,  claudication and leg swelling.  Gastrointestinal: Negative for abdominal pain, blood in stool, constipation, diarrhea, heartburn, melena, nausea and vomiting.  Genitourinary: Negative for dysuria, flank pain, frequency and hematuria.  Musculoskeletal: Negative for back pain, joint pain, myalgias and neck pain.  Skin: Negative for itching and rash.  Neurological: Negative for dizziness, tingling, tremors, sensory change, speech change, focal weakness, weakness and headaches.  Endo/Heme/Allergies: Negative for environmental allergies and polydipsia. Does not bruise/bleed easily.  Psychiatric/Behavioral: Negative for depression, hallucinations, substance abuse and suicidal ideas. The patient is not nervous/anxious.     MEDICAL HISTORY:  Past Medical History:  Diagnosis Date  . Arthritis   . Cancer ([place])    Head and neck cancer  . Hemorrhoids   . Hyperlipidemia   . Hypertension     SURGICAL HISTORY: Past Surgical History:  Procedure Laterality Date  . COLONOSCOPY WITH PROPOFOL N/A 04/04/2017   Procedure: COLONOSCOPY WITH PROPOFOL;  Surgeon: Robert Bellow, MD;  Location[facility] ENDOSCOPY;  Service: Endoscopy;  Laterality: N/A;  . MANDIBLE SURGERY    . PORTA CATH INSERTION N/A 12/19/2017   Procedure: PORTA CATH INSERTION;  Surgeon: Algernon Huxley, MD;  Location: [facility] CV LAB;  Service: Cardiovascular;  Laterality: N/A;  . TONSILLECTOMY      SOCIAL HISTORY: Social History   Socioeconomic History  . Marital status: Single    Spouse name: Not on file  . Number of children: Not on file  . Years of education: Not on file  . Highest education level: Not on file  Occupational History  . Not on file  Social Needs  . Financial resource strain: Not on file  . Food insecurity:    Worry: Not on file    Inability: Not on file  .  Transportation needs:    Medical: Not on file    Non-medical: Not on file  Tobacco Use  . Smoking status: Never Smoker  . Smokeless tobacco: Never Used   Substance and Sexual Activity  . Alcohol use: No  . Drug use: No  . Sexual activity: Not on file  Lifestyle  . Physical activity:    Days per week: Not on file    Minutes per session: Not on file  . Stress: Not on file  Relationships  . Social connections:    Talks on phone: Not on file    Gets together: Not on file    Attends religious service: Not on file    Active member of club or organization: Not on file    Attends meetings of clubs or organizations: Not on file    Relationship status: Not on file  . Intimate partner violence:    Fear of current or ex partner: Not on file    Emotionally abused: Not on file    Physically abused: Not on file    Forced sexual activity: Not on file  Other Topics Concern  . Not on file  Social History Narrative  . Not on file    FAMILY HISTORY: Family History  Problem Relation Age of Onset  . Breast cancer Mother   . Asthma Mother   . Congestive Heart Failure Mother   . Prostate cancer Father   . Brain cancer Father   . Bladder Cancer Father     ALLERGIES:  has No Known Allergies.  MEDICATIONS:  Current Outpatient Medications  Medication Sig Dispense Refill  . amLODipine (NORVASC) 2.5 MG tablet Take 1 tablet (2.5 mg total) by mouth daily. 30 tablet 0  . lansoprazole (PREVACID) 30 MG capsule Take 1 capsule (30 mg total) by mouth daily at 12 noon. 30 capsule 4  . Multiple Vitamins-Minerals (MULTIVITAMIN WITH MINERALS) tablet Take 1 tablet by mouth daily.    . ondansetron (ZOFRAN) 8 MG tablet TAKE 1 TABLET (8 MG TOTAL) BY MOUTH 2 (TWO) TIMES DAILY AS NEEDED.  1  . oxyCODONE-acetaminophen (PERCOCET/ROXICET) 5-325 MG tablet Take 1 tablet by mouth every 8 (eight) hours as needed for severe pain. 21 tablet 0  . rosuvastatin (CRESTOR) 10 MG tablet Take 10 mg by mouth every evening.  4  . lidocaine (XYLOCAINE) 2 % solution Use as directed 20 mLs in the mouth or throat every 4 (four) hours as needed for mouth pain (throat pain). (Patient  not taking: Reported on 04/03/2018) 300 mL 0   No current facility-administered medications for this visit.      PHYSICAL EXAMINATION: ECOG PERFORMANCE STATUS: 0 - Asymptomatic Vitals:   07/11/18 1423  BP: 134/90  Pulse: 81  Resp: 18  Temp: (!) 97.1 F (36.2 C)   Physical Exam  Constitutional: He is oriented to person, place, and time and well-developed, well-nourished, and in no distress. No distress.  HENT:  Head: Normocephalic and atraumatic.  Right Ear: External ear normal.  Left Ear: External ear normal.  Nose: Nose normal.  Mouth/Throat: Oropharynx is clear and moist. No oropharyngeal exudate.  No thrush Dry oral mucosa  Eyes: Pupils are equal, round, and reactive to light. Conjunctivae and EOM are normal. Left eye exhibits no discharge. No scleral icterus.  Neck: Normal range of motion. Neck supple. No thyromegaly present.  No palpable neck mass.  Cardiovascular: Normal rate, regular rhythm and normal heart sounds. Exam reveals no gallop and no friction rub.  No murmur  heard. Pulmonary/Chest: Effort normal and breath sounds normal. No respiratory distress. He has no wheezes. He has no rales. He exhibits no tenderness.  Abdominal: Soft. Bowel sounds are normal. He exhibits no distension. There is no tenderness. There is no rebound.  Musculoskeletal: Normal range of motion. He exhibits no edema, tenderness or deformity.  Lymphadenopathy:    He has no cervical adenopathy.  Neurological: He is alert and oriented to person, place, and time. No cranial nerve deficit. He exhibits normal muscle tone. Gait normal. Coordination normal.  Skin: Skin is warm and dry. He is not diaphoretic. No erythema.  Psychiatric: Affect normal.     LABORATORY DATA:  I have reviewed the data as listed Lab Results  Component Value Date   WBC 4.1 05/24/2018   HGB 11.9 (L) 05/24/2018   HCT 33.8 (L) 05/24/2018   MCV 93.5 05/24/2018   PLT 231 05/24/2018   Recent Labs    04/03/18 0809  04/10/18 0809 05/24/18 1338  NA 140 141 138  K 4.5 4.2 3.5  CL 109 106 109  CO2 23 24 25   GLUCOSE 94 139* 129*  BUN 27* 25* 26*  CREATININE 1.47* 1.70* 1.46*  CALCIUM 9.4 9.4 9.6  GFRNONAA 49* 41* 49*  GFRAA 57* 48* 57*  PROT 6.6 7.1 6.7  ALBUMIN 3.4* 3.6 3.7  AST 18 23 15   ALT 15* 14* 13*  ALKPHOS 75 87 62  BILITOT 0.5 0.8 0.5    RADIOGRAPHIC STUDIES: I have personally reviewed the radiological images as listed and agreed with the findings in the report. 03/08/2018  Near complete resolution of prior large right neck mass.Small right cervical/supraclavicular nodal metastases measuring up to 10 mm short axis. No evidence of distal metastases. 07/02/2018  No evidence of residual carcinoma in the neck.  No evidence of nodal metastasis.  No evidence of distant metastasis disease.   ASSESSMENT & PLAN:  Clinically he has cT2 cN2 disease, stage II, p16 positive. 1. History of radiation exposure   2. 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[place])   3. Port-A-Cath in place    #PET scan was independently reviewed and discussed with patient.  No evidence of residual carcinoma in the neck and no evidence of distant metastasis. Continue follow-up with ENT and radiation oncologist.  Eventually will alternate his follow-up between his treatment teams. He has an appointment to see radiation oncology in September, I will see him in 6 months. I will repeat another surveillance PET scan in the end of the year. Check TSH given his radiation history.   #Mediport, patient has concern about he may injure himself during work and asks if okay to remove Mediport.  Refer to Vascular Surgeon for port removal.    All questions were answered. The patient knows to call the clinic with any problems questions or concerns.  Return of visit: 6 months.  Total face to face encounter time for this patient visit was 15 min. >50% of the time was  spent in counseling and coordination of care.  Earlie Server, MD,  PhD Hematology Oncology [facility] at [facility] Pager- [phone] 07/12/2018

## 2018-07-15 ENCOUNTER — Other Ambulatory Visit (INDEPENDENT_AMBULATORY_CARE_PROVIDER_SITE_OTHER): Payer: Self-pay | Admitting: Vascular Surgery

## 2018-07-15 ENCOUNTER — Other Ambulatory Visit (INDEPENDENT_AMBULATORY_CARE_PROVIDER_SITE_OTHER): Payer: Self-pay | Admitting: Nurse Practitioner

## 2018-07-15 ENCOUNTER — Inpatient Hospital Stay: Payer: BLUE CROSS/BLUE SHIELD

## 2018-07-15 DIAGNOSIS — C109 Malignant neoplasm of oropharynx, unspecified: Secondary | ICD-10-CM | POA: Diagnosis not present

## 2018-07-15 DIAGNOSIS — Z923 Personal history of irradiation: Secondary | ICD-10-CM

## 2018-07-15 LAB — COMPREHENSIVE METABOLIC PANEL
ALK PHOS: 79 U/L (ref 38–126)
ALT: 15 U/L (ref 0–44)
ANION GAP: 9 (ref 5–15)
AST: 16 U/L (ref 15–41)
Albumin: 3.8 g/dL (ref 3.5–5.0)
BUN: 20 mg/dL (ref 8–23)
CALCIUM: 9.3 mg/dL (ref 8.9–10.3)
CO2: 25 mmol/L (ref 22–32)
Chloride: 108 mmol/L (ref 98–111)
Creatinine, Ser: 1.56 mg/dL — ABNORMAL HIGH (ref 0.61–1.24)
GFR calc Af Amer: 53 mL/min — ABNORMAL LOW (ref >60)
GFR calc non Af Amer: 46 mL/min — ABNORMAL LOW (ref >60)
Glucose, Bld: 102 mg/dL — ABNORMAL HIGH (ref 70–99)
POTASSIUM: 4 mmol/L (ref 3.5–5.1)
SODIUM: 142 mmol/L (ref 135–145)
Total Bilirubin: 0.8 mg/dL (ref 0.3–1.2)
Total Protein: 7.1 g/dL (ref 6.5–8.1)

## 2018-07-15 LAB — CBC WITH DIFFERENTIAL/PLATELET
BASOS ABS: 0 10*3/uL (ref 0–0.1)
BASOS PCT: 1 %
Eosinophils Absolute: 0.2 10*3/uL (ref 0–0.7)
Eosinophils Relative: 3 %
HEMATOCRIT: 38.9 % — AB (ref 40.0–52.0)
HEMOGLOBIN: 13.3 g/dL (ref 13.0–18.0)
Lymphocytes Relative: 10 %
Lymphs Abs: 0.6 10*3/uL — ABNORMAL LOW (ref 1.0–3.6)
MCH: 31.2 pg (ref 26.0–34.0)
MCHC: 34.2 g/dL (ref 32.0–36.0)
MCV: 91.3 fL (ref 80.0–100.0)
MONOS PCT: 8 %
Monocytes Absolute: 0.4 10*3/uL (ref 0.2–1.0)
NEUTROS ABS: 4.4 10*3/uL (ref 1.4–6.5)
NEUTROS PCT: 78 %
Platelets: 239 10*3/uL (ref 150–440)
RBC: 4.27 MIL/uL — ABNORMAL LOW (ref 4.40–5.90)
RDW: 14 % (ref 11.5–14.5)
WBC: 5.6 10*3/uL (ref 3.8–10.6)

## 2018-07-15 LAB — TSH: TSH: 1.806 u[IU]/mL (ref 0.350–4.500)

## 2018-07-16 MED ORDER — CEFAZOLIN SODIUM-DEXTROSE 2-4 GM/100ML-% IV SOLN: 2.0000 g | INTRAVENOUS | Status: DC

## 2018-07-17 ENCOUNTER — Other Ambulatory Visit (INDEPENDENT_AMBULATORY_CARE_PROVIDER_SITE_OTHER): Payer: Self-pay | Admitting: Vascular Surgery

## 2018-07-17 ENCOUNTER — Encounter
Admission: RE | Disposition: A | Discharge status: Home / Self Care | Payer: Self-pay | Source: Ambulatory Visit | Attending: Vascular Surgery

## 2018-07-17 ENCOUNTER — Ambulatory Visit
Admission: RE | Admit: 2018-07-17 | Discharge: 2018-07-17 | Disposition: A | Discharge status: Home / Self Care | Payer: BLUE CROSS/BLUE SHIELD | Source: Ambulatory Visit | Attending: Vascular Surgery | Admitting: Vascular Surgery

## 2018-07-17 DIAGNOSIS — I10 Essential (primary) hypertension: Secondary | ICD-10-CM | POA: Diagnosis not present

## 2018-07-17 DIAGNOSIS — M1990 Unspecified osteoarthritis, unspecified site: Secondary | ICD-10-CM | POA: Insufficient documentation

## 2018-07-17 DIAGNOSIS — Z8052 Family history of malignant neoplasm of bladder: Secondary | ICD-10-CM | POA: Diagnosis not present

## 2018-07-17 DIAGNOSIS — Z79899 Other long term (current) drug therapy: Secondary | ICD-10-CM | POA: Insufficient documentation

## 2018-07-17 DIAGNOSIS — Z8042 Family history of malignant neoplasm of prostate: Secondary | ICD-10-CM | POA: Insufficient documentation

## 2018-07-17 DIAGNOSIS — E785 Hyperlipidemia, unspecified: Secondary | ICD-10-CM | POA: Diagnosis not present

## 2018-07-17 DIAGNOSIS — Z808 Family history of malignant neoplasm of other organs or systems: Secondary | ICD-10-CM | POA: Insufficient documentation

## 2018-07-17 DIAGNOSIS — Z9889 Other specified postprocedural states: Secondary | ICD-10-CM | POA: Diagnosis not present

## 2018-07-17 DIAGNOSIS — Z8249 Family history of ischemic heart disease and other diseases of the circulatory system: Secondary | ICD-10-CM | POA: Insufficient documentation

## 2018-07-17 DIAGNOSIS — C109 Malignant neoplasm of oropharynx, unspecified: Secondary | ICD-10-CM | POA: Insufficient documentation

## 2018-07-17 DIAGNOSIS — C140 Malignant neoplasm of pharynx, unspecified: Secondary | ICD-10-CM

## 2018-07-17 DIAGNOSIS — Z803 Family history of malignant neoplasm of breast: Secondary | ICD-10-CM | POA: Insufficient documentation

## 2018-07-17 DIAGNOSIS — Z452 Encounter for adjustment and management of vascular access device: Secondary | ICD-10-CM | POA: Diagnosis present

## 2018-07-17 HISTORY — PX: PORT-A-CATH REMOVAL: SHX5289

## 2018-07-17 HISTORY — PX: PORTA CATH INSERTION: CATH118285

## 2018-07-17 SURGERY — PORTA CATH INSERTION
Anesthesia: Moderate Sedation

## 2018-07-17 MED ORDER — MIDAZOLAM HCL 2 MG/2ML IJ SOLN
INTRAMUSCULAR | Status: DC | PRN
  Administered 2018-07-17: 2 mg via INTRAVENOUS
  Administered 2018-07-17: 1 mg via INTRAVENOUS

## 2018-07-17 MED ORDER — SODIUM CHLORIDE FLUSH 0.9 % IV SOLN
INTRAVENOUS | Status: AC
  Filled 2018-07-17: qty 20

## 2018-07-17 MED ORDER — ONDANSETRON HCL 4 MG/2ML IJ SOLN: 4.0000 mg | Freq: Four times a day (QID) | INTRAMUSCULAR | Status: DC | PRN

## 2018-07-17 MED ORDER — MIDAZOLAM HCL 5 MG/5ML IJ SOLN
INTRAMUSCULAR | Status: AC
  Filled 2018-07-17: qty 5

## 2018-07-17 MED ORDER — FENTANYL CITRATE (PF) 100 MCG/2ML IJ SOLN
INTRAMUSCULAR | Status: DC | PRN
  Administered 2018-07-17 (×2): 50 ug via INTRAVENOUS

## 2018-07-17 MED ORDER — SODIUM CHLORIDE 0.9 % IV SOLN
INTRAVENOUS | Status: DC
  Administered 2018-07-17: 09:00:00 via INTRAVENOUS

## 2018-07-17 MED ORDER — LIDOCAINE-EPINEPHRINE (PF) 1 %-1:200000 IJ SOLN
INTRAMUSCULAR | Status: DC | PRN
  Administered 2018-07-17: 10 mL

## 2018-07-17 MED ORDER — FAMOTIDINE 20 MG PO TABS: 40.0000 mg | ORAL_TABLET | ORAL | Status: DC | PRN

## 2018-07-17 MED ORDER — METHYLPREDNISOLONE SODIUM SUCC 125 MG IJ SOLR: 125.0000 mg | INTRAMUSCULAR | Status: DC | PRN

## 2018-07-17 MED ORDER — CEFAZOLIN SODIUM-DEXTROSE 2-4 GM/100ML-% IV SOLN
2.0000 g | Freq: Once | INTRAVENOUS | Status: AC
  Administered 2018-07-17: 2 g via INTRAVENOUS

## 2018-07-17 MED ORDER — HYDROMORPHONE HCL 1 MG/ML IJ SOLN: 1.0000 mg | Freq: Once | INTRAMUSCULAR | Status: DC | PRN

## 2018-07-17 MED ORDER — FENTANYL CITRATE (PF) 100 MCG/2ML IJ SOLN
INTRAMUSCULAR | Status: AC
  Filled 2018-07-17: qty 2

## 2018-07-17 MED ORDER — LIDOCAINE-EPINEPHRINE (PF) 1 %-1:200000 IJ SOLN
INTRAMUSCULAR | Status: AC
  Filled 2018-07-17: qty 30

## 2018-07-17 SURGICAL SUPPLY — 7 items
PACK ANGIOGRAPHY (CUSTOM PROCEDURE TRAY) ×3 IMPLANT
PAD GROUND ADULT SPLIT (MISCELLANEOUS) ×3 IMPLANT
PENCIL ELECTRO HAND CTR (MISCELLANEOUS) ×3 IMPLANT
SUT MNCRL AB 4-0 PS2 18 (SUTURE) ×3 IMPLANT
SUT PROLENE 0 CT 1 30 (SUTURE) ×3 IMPLANT
SUT VIC AB 3-0 SH 27 (SUTURE) ×2
SUT VIC AB 3-0 SH 27X BRD (SUTURE) ×1 IMPLANT

## 2018-07-17 NOTE — Op Note (Signed)
[facility]       Operative Note  Date: 07/17/2018  Preoperative diagnosis:  1. Head and neck cancer, completed treatment and no longer using port  Postoperative diagnosis:  Same as above  Procedures: #1. Removal of left jugular port a cath   Surgeon: Leotis Pain, MD  Anesthesia: Local with moderate conscious sedation for 20 minutes using 3 mg of Versed and 100 mcg of Fentanyl  Fluoroscopy time: none  Contrast used: 0  Estimated blood loss: 2 cc  Indication for the procedure:  The patient is a 62 y.o. male who has completed therapy for head and neck cancer and no longer needs their Port-A-Cath. The patient desires to have this removed. Risks and benefits including need for potential replacement with recurrent disease were discussed and patient is agreeable to proceed.  Description of procedure: The patient was brought to the vascular and interventional radiology suite. Moderate conscious sedation was administered during a face to face encounter with the patient throughout the procedure with my supervision of the RN administering medicines and monitoring the patient's vital signs, pulse oximetry, telemetry and mental status throughout from the start of the procedure until the patient was taken to the recovery room.  The left neck chest and shoulder were sterilely prepped and draped, and a sterile surgical field was created. The area was then anesthetized with 1% lidocaine copiously. The previous incision was reopened and electrocautery used to dissected down to the port and the catheter. These were dissected free and the catheter was gently removed from the vein in its entirety. The port was dissected out from the fibrous connective tissue and the Prolene sutures were removed. The port was then removed in its entirety including the catheter. The wound was then closed with a 3-0 Vicryl and a 4-0 Monocryl and Dermabond was placed as  a dressing. The patient was then taken to the recovery room in stable condition having tolerated the procedure well.  Complications: none  Condition: stable   Leotis Pain, MD 07/17/2018 10:21 AM   This note was created with Dragon Medical transcription system. Any errors in dictation are purely unintentional.

## 2018-07-17 NOTE — Discharge Instructions (Signed)
Moderate Conscious Sedation, Adult, Care After These instructions provide you with information about caring for yourself after your procedure. Your health care provider may also give you more specific instructions. Your treatment has been planned according to current medical practices, but problems sometimes occur. Call your health care provider if you have any problems or questions after your procedure. What can I expect after the procedure? After your procedure, it is common:  To feel sleepy for several hours.  To feel clumsy and have poor balance for several hours.  To have poor judgment for several hours.  To vomit if you eat too soon.  Follow these instructions at home: For at least 24 hours after the procedure:   Do not: ? Participate in activities where you could fall or become injured. ? Drive. ? Use heavy machinery. ? Drink alcohol. ? Take sleeping pills or medicines that cause drowsiness. ? Make important decisions or sign legal documents. ? Take care of children on your own.  Rest. Eating and drinking  Follow the diet recommended by your health care provider.  If you vomit: ? Drink water, juice, or soup when you can drink without vomiting. ? Make sure you have little or no nausea before eating solid foods. General instructions  Have a responsible adult stay with you until you are awake and alert.  Take over-the-counter and prescription medicines only as told by your health care provider.  If you smoke, do not smoke without supervision.  Keep all follow-up visits as told by your health care provider. This is important. Contact a health care provider if:  You keep feeling nauseous or you keep vomiting.  You feel light-headed.  You develop a rash.  You have a fever. Get help right away if:  You have trouble breathing. This information is not intended to replace advice given to you by your health care provider. Make sure you discuss any questions you have  with your health care provider. Document Released: 09/10/2013 Document Revised: 04/24/2016 Document Reviewed: 03/11/2016 Elsevier Interactive Patient Education  2018 [employer], Adult An incision is a cut that a doctor makes in your skin for surgery (for a procedure). Most times, these cuts are closed after surgery. Your cut from surgery may be closed with stitches (sutures), staples, skin glue, or skin tape (adhesive strips). You may need to return to your doctor to have stitches or staples taken out. This may happen many days or many weeks after your surgery. The cut needs to be well cared for so it does not get infected. How to care for your cut Cut care  Follow instructions from your doctor about how to take care of your cut. Make sure you: ? Wash your hands with soap and water before you change your bandage (dressing). If you cannot use soap and water, use hand sanitizer. ? Change your bandage as told by your doctor. ? Leave stitches, skin glue, or skin tape in place. They may need to stay in place for 2 weeks or longer. If tape strips get loose and curl up, you may trim the loose edges. Do not remove tape strips completely unless your doctor says it is okay.  Check your cut area every day for signs of infection. Check for: ? More redness, swelling, or pain. ? More fluid or blood. ? Warmth. ? Pus or a bad smell.  Ask your doctor how to clean the cut. This may include: ? Using mild soap and water. ? Using a clean  towel to pat the cut dry after you clean it. ? Putting a cream or ointment on the cut. Do this only as told by your doctor. ? Covering the cut with a clean bandage.  Ask your doctor when you can leave the cut uncovered.  Do not take baths, swim, or use a hot tub until your doctor says it is okay. Ask your doctor if you can take showers. You may only be allowed to take sponge baths for bathing. Medicines  If you were prescribed an antibiotic medicine,  cream, or ointment, take the antibiotic or put it on the cut as told by your doctor. Do not stop taking or putting on the antibiotic even if your condition gets better.  Take over-the-counter and prescription medicines only as told by your doctor. General instructions  Limit movement around your cut. This helps healing. ? Avoid straining, lifting, or exercise for the first month, or for as long as told by your doctor. ? Follow instructions from your doctor about going back to your normal activities. ? Ask your doctor what activities are safe.  Protect your cut from the sun when you are outside for the first 6 months, or for as long as told by your doctor. Put on sunscreen around the scar or cover up the scar.  Keep all follow-up visits as told by your doctor. This is important. Contact a doctor if:  Your have more redness, swelling, or pain around the cut.  You have more fluid or blood coming from the cut.  Your cut feels warm to the touch.  You have pus or a bad smell coming from the cut.  You have a fever or shaking chills.  You feel sick to your stomach (nauseous) or you throw up (vomit).  You are dizzy.  Your stitches or staples come undone. Get help right away if:  You have a red streak coming from your cut.  Your cut bleeds through the bandage and the bleeding does not stop with gentle pressure.  The edges of your cut open up and separate.  You have very bad (severe) pain.  You have a rash.  You are confused.  You pass out (faint).  You have trouble breathing and you have a fast heartbeat. This information is not intended to replace advice given to you by your health care provider. Make sure you discuss any questions you have with your health care provider. Document Released: 02/12/2012 Document Revised: 07/28/2016 Document Reviewed: 07/28/2016 Elsevier Interactive Patient Education  2017 [employer].

## 2018-07-17 NOTE — H&P (Signed)
[facility] History & Physical Update  The patient was interviewed and re-examined.  The patient's previous History and Physical has been reviewed and is unchanged. He has completed chemo and wants his port out.  There is no change in the plan of care. We plan to proceed with the scheduled procedure.  Leotis Pain, MD  07/17/2018, 8:33 AM

## 2018-08-12 ENCOUNTER — Ambulatory Visit
Admission: RE | Admit: 2018-08-12 | Discharge: 2018-08-12 | Disposition: A | Discharge status: Home / Self Care | Payer: BLUE CROSS/BLUE SHIELD | Source: Ambulatory Visit | Attending: Radiation Oncology | Admitting: Radiation Oncology

## 2018-08-12 ENCOUNTER — Other Ambulatory Visit: Payer: Self-pay

## 2018-08-12 ENCOUNTER — Encounter: Payer: Self-pay | Admitting: Radiation Oncology

## 2018-08-12 DIAGNOSIS — Z923 Personal history of irradiation: Secondary | ICD-10-CM | POA: Insufficient documentation

## 2018-08-12 DIAGNOSIS — Z85810 Personal history of malignant neoplasm of tongue: Secondary | ICD-10-CM | POA: Insufficient documentation

## 2018-08-12 DIAGNOSIS — Z9221 Personal history of antineoplastic chemotherapy: Secondary | ICD-10-CM | POA: Insufficient documentation

## 2018-08-12 DIAGNOSIS — I890 Lymphedema, not elsewhere classified: Secondary | ICD-10-CM | POA: Diagnosis not present

## 2018-08-12 DIAGNOSIS — C109 Malignant neoplasm of oropharynx, unspecified: Secondary | ICD-10-CM

## 2018-08-12 NOTE — Progress Notes (Signed)
Radiation Oncology Follow up Note  Name: Ivan Garcia [facility].   Date:   08/12/2018 MRN:  [id number] DOB: Aug 05, 1956    This 62 y.o. male presents to the clinic today for 4 month follow-up status post concurrent chemotherapy radiation therapy for locally advanced base of tongue release P 16 positive squamous cell carcinoma.  REFERRING PROVIDER: Albina Billet, MD  HPI: patient is a 62 year old male now out over 4 months having completed concurrent chemoradiation therapy for locally advanced squamous cell carcinoma the base of tongue P 16 positive. Seen today in routine follow up he is doing well taste is slowly returning he does have some lymphedema in his lower chin which he states is resolving nicely. Specifically denies head and neck pain or dysphagia. He had a recent PET CT scan which I have reviewed shows no evidence of disease..  COMPLICATIONS OF TREATMENT: none  FOLLOW UP COMPLIANCE: keeps appointments   PHYSICAL EXAM:  BP (P) 131/89 (BP Location: Left Arm, Patient Position: Sitting)   Pulse (!) (P) 102   Temp (!) (P) 97 F (36.1 C) (Tympanic)   Resp (P) 18   Wt (P) 185 lb 6.5 oz (84.1 kg)   BMI (P) 26.60 kg/m  Oral cavity is clear no oral mucosal lesions are identified. Indirect mirror examination shows base of tongue within normal limits. Neck is clear without evidence of subject gastric cervical or supra clavicular adenopathy lymphedema of his neck is appreciated.Well-developed well-nourished patient in NAD. HEENT reveals PERLA, EOMI, discs not visualized.  Oral cavity is clear. No oral mucosal lesions are identified. Neck is clear without evidence of cervical or supraclavicular adenopathy. Lungs are clear to A&P. Cardiac examination is essentially unremarkable with regular rate and rhythm without murmur rub or thrill. Abdomen is benign with no organomegaly or masses noted. Motor sensory and DTR levels are equal and symmetric in the upper and lower extremities. Cranial nerves II  through XII are grossly intact. Proprioception is intact. No peripheral adenopathy or edema is identified. No motor or sensory levels are noted. Crude visual fields are within normal range.  RADIOLOGY RESULTS: PET CT scan is reviewed and compatible with the above-stated findings  PLAN: present time patient is doing well with no evidence of disease by PET CT criteria am please was overall progress. He is scheduled for another PET/CT in December I've asked to see him back shortly thereafter. Patient knows to call with any concerns at any time I am please was overall progress.  I would like to take this opportunity to thank you for allowing me to participate in the care of your patient.Noreene Filbert, MD

## 2018-09-17 ENCOUNTER — Inpatient Hospital Stay: Payer: BLUE CROSS/BLUE SHIELD | Admitting: Oncology

## 2018-09-17 ENCOUNTER — Inpatient Hospital Stay: Payer: BLUE CROSS/BLUE SHIELD | Attending: Oncology

## 2018-11-18 ENCOUNTER — Other Ambulatory Visit: Payer: Self-pay | Admitting: Oncology

## 2018-11-18 DIAGNOSIS — Z923 Personal history of irradiation: Secondary | ICD-10-CM

## 2018-11-18 DIAGNOSIS — C109 Malignant neoplasm of oropharynx, unspecified: Secondary | ICD-10-CM

## 2018-12-02 ENCOUNTER — Ambulatory Visit: Payer: BLUE CROSS/BLUE SHIELD

## 2018-12-02 ENCOUNTER — Other Ambulatory Visit: Payer: BLUE CROSS/BLUE SHIELD

## 2018-12-12 ENCOUNTER — Other Ambulatory Visit: Payer: BLUE CROSS/BLUE SHIELD

## 2018-12-12 ENCOUNTER — Ambulatory Visit: Payer: BLUE CROSS/BLUE SHIELD | Admitting: Oncology

## 2018-12-25 ENCOUNTER — Ambulatory Visit: Payer: BLUE CROSS/BLUE SHIELD | Admitting: Radiation Oncology

## 2018-12-25 ENCOUNTER — Ambulatory Visit: Payer: BLUE CROSS/BLUE SHIELD

## 2019-08-06 ENCOUNTER — Other Ambulatory Visit: Payer: Self-pay | Admitting: Specialist

## 2019-08-06 DIAGNOSIS — J3800 Paralysis of vocal cords and larynx, unspecified: Secondary | ICD-10-CM

## 2019-08-20 ENCOUNTER — Other Ambulatory Visit: Payer: Self-pay

## 2019-08-20 ENCOUNTER — Ambulatory Visit
Admission: RE | Admit: 2019-08-20 | Discharge: 2019-08-20 | Disposition: A | Discharge status: Home / Self Care | Payer: PRIVATE HEALTH INSURANCE | Source: Ambulatory Visit | Attending: Specialist | Admitting: Specialist

## 2019-08-20 DIAGNOSIS — I700 Atherosclerosis of aorta: Secondary | ICD-10-CM | POA: Insufficient documentation

## 2019-08-20 DIAGNOSIS — N201 Calculus of ureter: Secondary | ICD-10-CM | POA: Diagnosis not present

## 2019-08-20 DIAGNOSIS — R911 Solitary pulmonary nodule: Secondary | ICD-10-CM | POA: Diagnosis not present

## 2019-08-20 DIAGNOSIS — J3801 Paralysis of vocal cords and larynx, unilateral: Secondary | ICD-10-CM | POA: Insufficient documentation

## 2019-08-20 DIAGNOSIS — N400 Enlarged prostate without lower urinary tract symptoms: Secondary | ICD-10-CM | POA: Insufficient documentation

## 2019-08-20 DIAGNOSIS — R590 Localized enlarged lymph nodes: Secondary | ICD-10-CM | POA: Diagnosis not present

## 2019-08-20 DIAGNOSIS — J3800 Paralysis of vocal cords and larynx, unspecified: Secondary | ICD-10-CM

## 2019-08-20 LAB — GLUCOSE, CAPILLARY: Glucose-Capillary: 90 mg/dL (ref 70–99)

## 2019-08-20 IMAGING — CT NM PET TUM IMG INITIAL (PI) SKULL BASE T - THIGH
10 series · 24 of 25 positions shown · non-contrast
Comparison: [DATE] and CT neck [DATE].

CLINICAL DATA: Subsequent treatment strategy for throat cancer.

EXAM:
NUCLEAR MEDICINE PET SKULL BASE TO THIGH
TECHNIQUE: 9.8 mCi F-18 FDG was injected intravenously. Full-ring PET imaging
was performed from the skull base to thigh after the radiotracer. CT
data was obtained and used for attenuation correction and anatomic
localization.
Fasting blood glucose: 90 mg/dl

[Series 3: ct wb 5.0 b30f · axial · 5.0mm · 0.98mm/px · z∈[-1086,-102]mm · 3 of 329 slices shown]
[im 1/329]
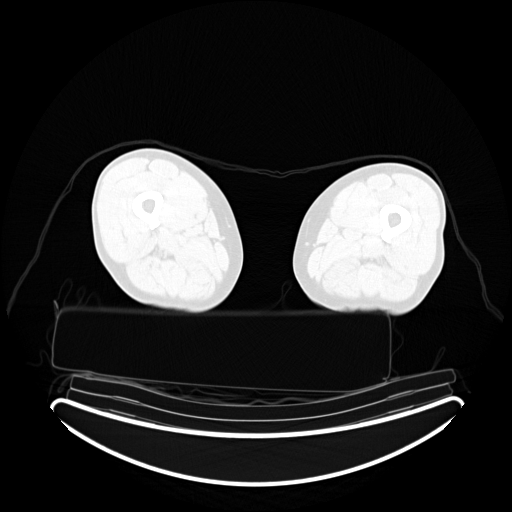
[im 165/329]
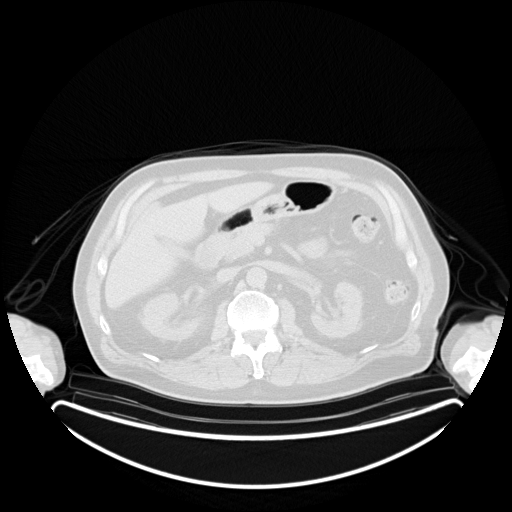
[im 329/329  brain]
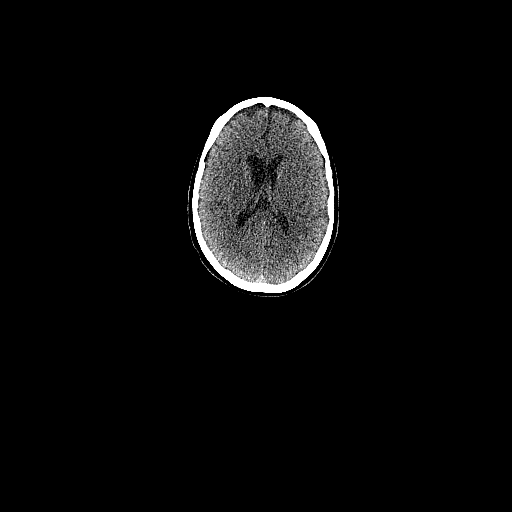

[Series 4: pet wb (ac) · axial · 5.0mm · 2.91mm/px · z∈[-1086,-102]mm · 3 of 329 slices shown]
[im 1/329]
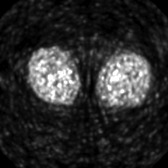
[im 165/329]
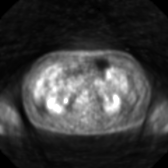
[im 329/329]
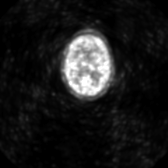

[Series 5: pet wb uncorrected (nac) · axial · 5.0mm · 4.07mm/px · z∈[-1086,-102]mm · 4 of 329 slices shown]
[im 1/329]
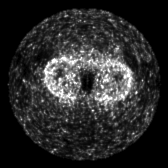
[im 110/329]
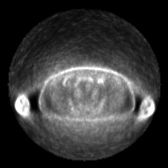
[im 219/329]
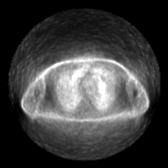
[im 329/329]
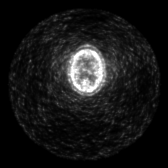

[Series 603: pet_ct axial fused · 3 of 324 slices shown]
[im 1/324]
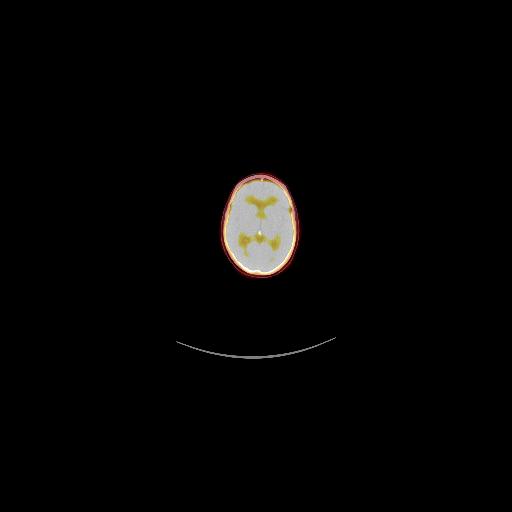
[im 108/324]
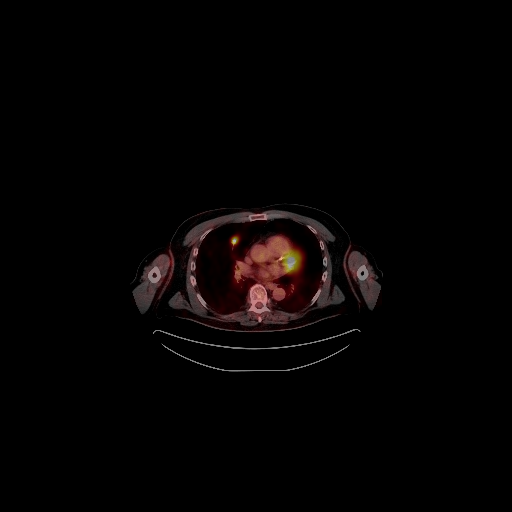
[im 324/324]
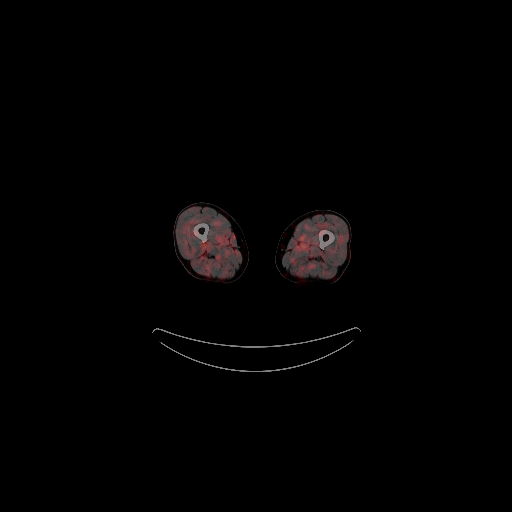

[Series 604: pet_ct coronal fused · 1 of 87 slices shown]
[im 1/87]
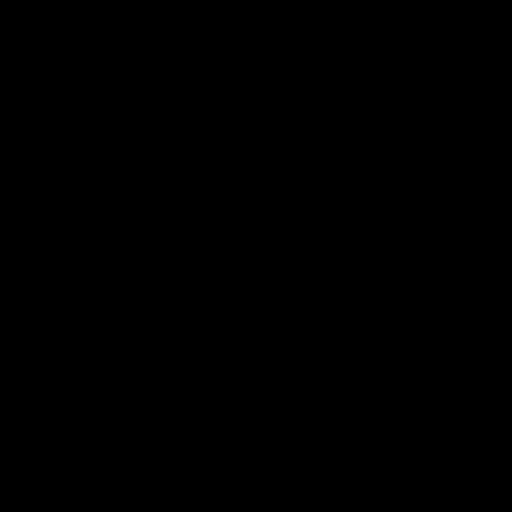

[Series 605: pet_ct sagittal fused · 2 of 154 slices shown]
[im 1/154]
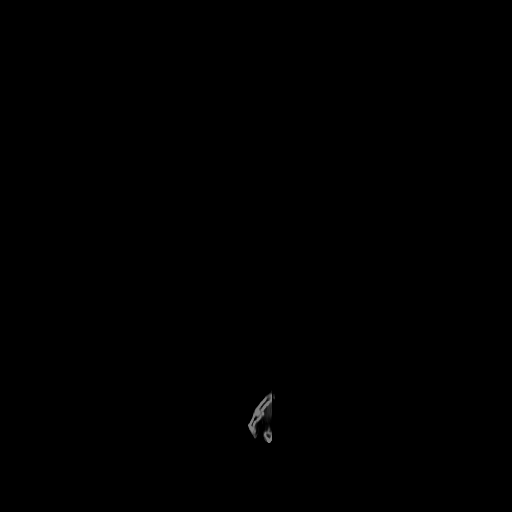
[im 154/154]
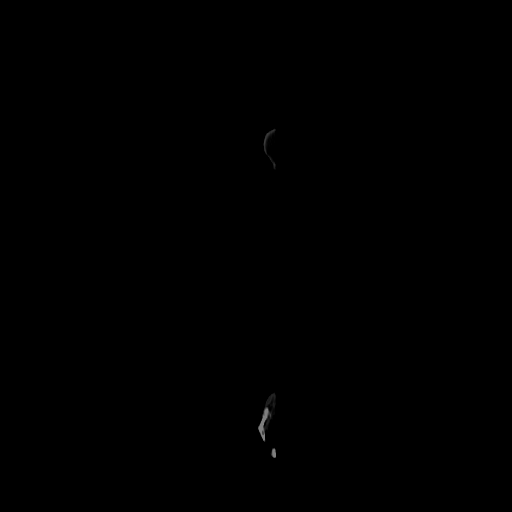

[Series 606: pet axial · 4 of 327 slices shown]
[im 1/327]
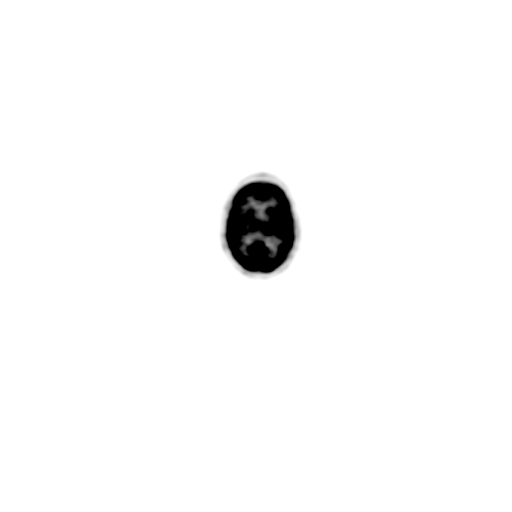
[im 109/327]
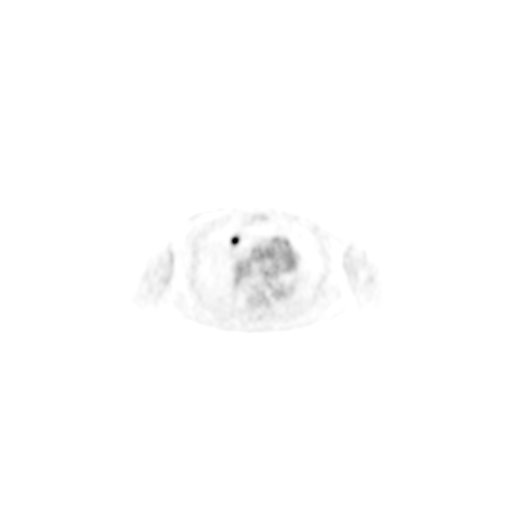
[im 218/327]
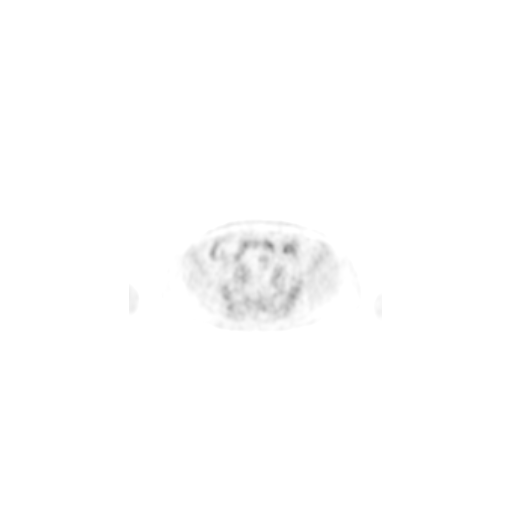
[im 327/327]
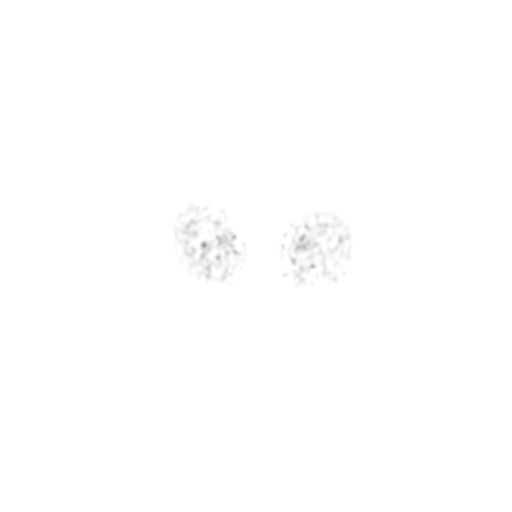

[Series 607: pet coronal · 1 of 111 slices shown]
[im 1/111]
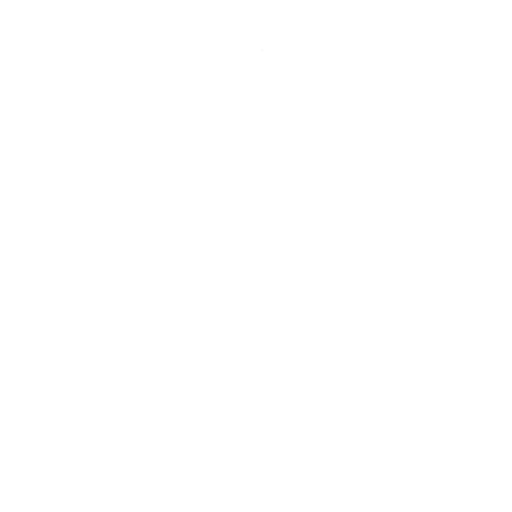

[Series 608: pet sagittal · 2 of 160 slices shown]
[im 1/160]
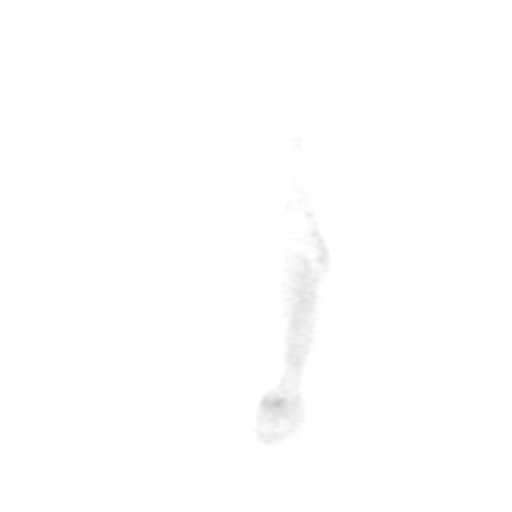
[im 160/160]
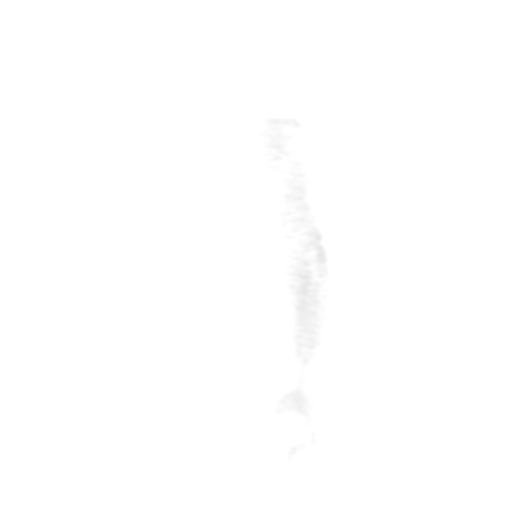

[Series 8167: results mm oncology reading · 5.0mm · 1.06mm/px · 1 of 6 slices shown]
[im 1/6]
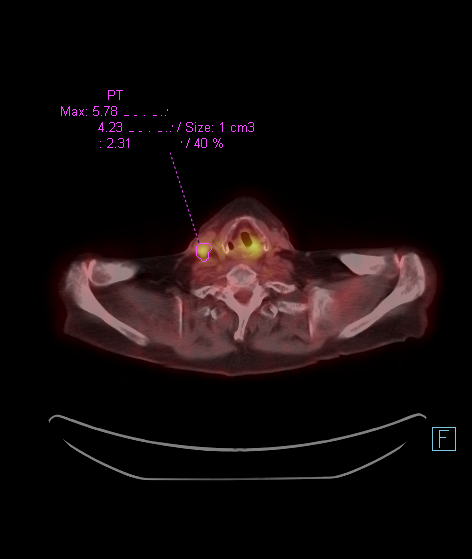

[24 of 25 positions shown; findings below may reference images not displayed]

FINDINGS: Mediastinal blood pool activity: SUV max

Liver activity: SUV max NA

NECK: Right level-II lymph node measures 8 mm (3/63) with an SUV max
of 5.8. There is asymmetric hypermetabolism within the left vocal
cords, SUV max 13.8. No additional abnormal hypermetabolism in the
neck.

Incidental CT findings: None.

CHEST: There are 2 hypermetabolic pathologically enlarged lymph
nodes in the superior mediastinum. Index high right paratracheal
lymph node measures 2.5 cm with an SUV max of 16.0. A soft tissue
nodule or lymph node medial to the proximal right clavicle measures
10 mm in short axis (3/79) with an SUV max of 5.4. No additional
hypermetabolic hilar or axillary lymph nodes. A pulmonary nodule
along the anterior margin of minor fissure measures 1.4 cm (3/113)
with an SUV max of 9.1.

Incidental CT findings: Atherosclerotic calcification of the aorta,
aortic valve and coronary arteries. No pericardial or pleural
effusion.

ABDOMEN/PELVIS: No abnormal hypermetabolism in the liver, adrenal
glands, spleen or pancreas. No hypermetabolic lymph nodes.

Incidental CT findings: Low-attenuation lesion in the left hepatic
lobe measures 1.5 cm, likely a cyst. There may be sludge in the
gallbladder. Adrenal glands are unremarkable. Low-attenuation lesion
in the upper pole right kidney measures 4.9 cm and is likely a cyst.
A 1 mm stone is seen in the proximal left ureter (3/183), without
hydronephrosis. There may be renal sinus cysts on the left. Spleen,
pancreas, stomach and bowel are unremarkable. Bladder is low in
volume. Prostate is mildly enlarged. Atherosclerotic calcification
of the aorta.

SKELETON: No abnormal osseous hypermetabolism.

Incidental CT findings: None.
IMPRESSION: 1. Asymmetric hypermetabolism involving the left vocal cords with
hypermetabolic metastatic adenopathy in the neck and chest.
Hypermetabolic pulmonary nodule along the minor fissure is also felt
to be metastatic.
2. Tiny proximal left ureteral stone without hydronephrosis.
3. Possible gallbladder sludge.
4. Enlarged prostate.
5. Aortic atherosclerosis ([TR]-170.0). Coronary artery
calcification.

## 2019-08-20 MED ORDER — FLUDEOXYGLUCOSE F - 18 (FDG) INJECTION
9.8200 | Freq: Once | INTRAVENOUS | Status: AC | PRN
  Administered 2019-08-20: 9.82 via INTRAVENOUS

## 2019-08-25 ENCOUNTER — Encounter: Payer: Self-pay | Admitting: Oncology

## 2019-08-25 ENCOUNTER — Other Ambulatory Visit: Payer: Self-pay

## 2019-08-25 NOTE — Progress Notes (Signed)
Patient pre screened for office appointment, no questions or concerns today. 

## 2019-08-26 ENCOUNTER — Other Ambulatory Visit: Payer: Self-pay

## 2019-08-26 ENCOUNTER — Inpatient Hospital Stay: Payer: PRIVATE HEALTH INSURANCE | Attending: Oncology | Admitting: Oncology

## 2019-08-26 VITALS — BP 135/89 | HR 87 | Temp 97.0°F | Resp 18 | Wt 178.4 lb

## 2019-08-26 DIAGNOSIS — R590 Localized enlarged lymph nodes: Secondary | ICD-10-CM | POA: Diagnosis not present

## 2019-08-26 DIAGNOSIS — R911 Solitary pulmonary nodule: Secondary | ICD-10-CM | POA: Diagnosis not present

## 2019-08-26 DIAGNOSIS — N183 Chronic kidney disease, stage 3 unspecified: Secondary | ICD-10-CM

## 2019-08-26 DIAGNOSIS — I129 Hypertensive chronic kidney disease with stage 1 through stage 4 chronic kidney disease, or unspecified chronic kidney disease: Secondary | ICD-10-CM | POA: Insufficient documentation

## 2019-08-26 DIAGNOSIS — T451X5A Adverse effect of antineoplastic and immunosuppressive drugs, initial encounter: Secondary | ICD-10-CM

## 2019-08-26 DIAGNOSIS — R1310 Dysphagia, unspecified: Secondary | ICD-10-CM

## 2019-08-26 DIAGNOSIS — C109 Malignant neoplasm of oropharynx, unspecified: Secondary | ICD-10-CM | POA: Insufficient documentation

## 2019-08-26 DIAGNOSIS — D6481 Anemia due to antineoplastic chemotherapy: Secondary | ICD-10-CM | POA: Insufficient documentation

## 2019-08-26 MED ORDER — OXYCODONE HCL 5 MG PO TABS: 5.0000 mg | ORAL_TABLET | Freq: Two times a day (BID) | ORAL | 0 refills | Status: DC | PRN

## 2019-08-26 NOTE — Progress Notes (Signed)
Patient is coming in today for follow up, he mentions his throat is bothering him. He takes Tylenol but this isnt helping

## 2019-08-26 NOTE — Progress Notes (Signed)
Hematology/Oncology  Follow Up note [facility] [phone] [phone]   Patient Care Team: Albina Billet, MD as PCP - General (Internal Medicine) Albina Billet, MD (Internal Medicine) Bary Castilla, Forest Gleason, MD (General Surgery) Noreene Filbert, MD as Referring Physician (Radiation Oncology) Earlie Server, MD as Consulting Physician (Oncology)  REFERRING PROVIDER: Dr.McQueen CHIEF COMPLAINTS/PURPOSE OF CONSULTATION:  Evaluation of newly diagnosed head and neck cancer.  HISTORY OF PRESENTING ILLNESS:  Ivan Strough. is a  63 y.o.  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 P16 positive. cT2 cN2 disease, stage II, Concurrent ChemoRT  Cisplatin 100 mg/m2 q3weeks with concurrent RT which started on 12/31/2017.  S/p one dose of Cisplatin. Cisplatin discontinued due to nephrotoxicity. Switch to weekly Carboplatin (AUC 2) / Taxol 45mg /m2 [ finished May 2019] Patient has finished concurrent chemoradiation in May 2019  Ivan Garcia. is a 63 y.o. male who presents for follow-up stage II squamous cell carcinoma, P 16+.  And discussion of PET scan results. Patient lost follow-up due to COVID-19 pandemic.  He reports doing well until in July 2020, he started to have hoarseness after singing for 2 to 3 hours. He has changed his insurance and Dr. Tami Garcia is no longer covered by his current insurance.  So he establish care with Ivan Garcia at [facility] and was seen on 08/04/2019. Flexible laryngoscopy showed Vocal cord paralysis.  Patient was recommended to have a PET scan done for restaging. Patient's primary care provider Ivan Garcia ordered a PET scan for patient which was done on 08/20/2019.  PET scan unfortunately showed metastatic disease.  Patient called cancer center and is scheduled follow-up with me.  08/20/2019 PET scan showed asymmetric hypermetabolic him involving the left vocal cords with hypermetabolic adenopathy in the neck, and the chest.  He also has hypermetabolic pulmonary nodule along the minor fissure which is felt to be metastatic. Tiny proximal left ureteral stone without hydronephrosis.  Possible gallbladder sludge.  Enlarged prostate.  Atherosclerosis.  Patient present to discuss PET scan result and discussion of management plan. Patient reports continued to have hoarseness.  Also sore throat which is worsened with prolonged talking. Reported right neck pain which gradually worsened.  He takes Tylenol without much symptom relief.  Request some stronger pain medications.  Continue to have chronic odynophagia.  mild  Review of Systems  Constitutional: Negative for chills, fever, malaise/fatigue and weight loss.  HENT: Negative for sore throat.        Sore throat, hoarseness  neck pain  Eyes: Negative for redness.  Respiratory: Negative for cough, shortness of breath and wheezing.   Cardiovascular: Negative for chest pain, palpitations and leg swelling.  Gastrointestinal: Negative for abdominal pain, blood in stool, nausea and vomiting.  Genitourinary: Negative for dysuria.  Musculoskeletal: Negative for myalgias.  Skin: Negative for rash.  Neurological: Negative for dizziness, tingling and tremors.  Endo/Heme/Allergies: Does not bruise/bleed easily.  Psychiatric/Behavioral: Negative for hallucinations.    MEDICAL HISTORY:  Past Medical History:  Diagnosis Date  . Arthritis   . Cancer ([place])    Head and neck cancer  . Hemorrhoids   . Hyperlipidemia   . Hypertension     SURGICAL HISTORY: Past Surgical History:  Procedure Laterality Date  . COLONOSCOPY WITH PROPOFOL N/A 04/04/2017   Procedure: COLONOSCOPY WITH PROPOFOL;  Surgeon: Robert Bellow, MD;  Location: [facility] ENDOSCOPY;  Service: Endoscopy;  Laterality: N/A;  . MANDIBLE SURGERY    . PORT-A-CATH REMOVAL   07/17/2018  . PORTA CATH INSERTION N/A 12/19/2017   Procedure: PORTA CATH INSERTION;  Surgeon: Algernon Huxley, MD;  Location: [place] [facility];  Service: Cardiovascular;  Laterality: N/A;  . PORTA CATH INSERTION N/A 07/17/2018   Procedure: PORTA CATH INSERTION;  Surgeon: Algernon Huxley, MD;  Location: [place] [facility];  Service: Cardiovascular;  Laterality: N/A;  . TONSILLECTOMY      SOCIAL HISTORY: Social History   Socioeconomic History  . Marital status: Single    Spouse name: Not on file  . Number of children: Not on file  . Years of education: Not on file  . Highest education level: Not on file  Occupational History  . Not on file  Social Needs  . Financial resource strain: Not on file  . Food insecurity    Worry: Not on file    Inability: Not on file  . Transportation needs    Medical: Not on file    Non-medical: Not on file  Tobacco Use  . Smoking status: Never Smoker  . Smokeless tobacco: Never Used  Substance and Sexual Activity  . Alcohol use: No  . Drug use: No  . Sexual activity: Not on file  Lifestyle  . Physical activity    Days per week: Not on file    Minutes per session: Not on file  . Stress: Not on file  Relationships  . Social Herbalist on phone: Not on file    Gets together: Not on file    Attends religious service: Not on file    Active member of club or organization: Not on file    Attends meetings of clubs or organizations: Not on file    Relationship status: Not on file  . Intimate partner violence    Fear of current or ex partner: Not on file    Emotionally abused: Not on file    Physically abused: Not on file    Forced sexual activity: Not on file  Other Topics Concern  . Not on file  Social History Narrative  . Not on file    FAMILY HISTORY: Family History  Problem Relation Age of Onset  . Breast cancer Mother   . Asthma Mother   . Congestive Heart Failure Mother   . Prostate cancer Father   . Brain cancer Father    . Bladder Cancer Father     ALLERGIES:  has No Known Allergies.  MEDICATIONS:  Current Outpatient Medications  Medication Sig Dispense Refill  . Multiple Vitamins-Minerals (MULTIVITAMIN WITH MINERALS) tablet Take 1 tablet by mouth daily.    . rosuvastatin (CRESTOR) 10 MG tablet Take 10 mg by mouth every evening.  4  . amLODipine (NORVASC) 2.5 MG tablet Take 1 tablet (2.5 mg total) by mouth daily. (Patient taking differently: Take 2.5 mg by mouth daily. Patient now taking 5 mg daily (2 tabs)) 30 tablet 0  . oxyCODONE (ROXICODONE) 5 MG immediate release tablet Take 1 tablet (5 mg total) by mouth every 12 (twelve) hours as needed for moderate pain or severe pain. 30 tablet 0   No current facility-administered medications for this visit.      PHYSICAL EXAMINATION: ECOG PERFORMANCE STATUS: 0 - Asymptomatic Vitals:   08/26/19 0910  BP: 135/89  Garcia: 87  Resp: 18  Temp: (!) 97 F (36.1 C)   Physical Exam  Constitutional: He is oriented to person, place, and time and well-developed, well-nourished, and in no distress. No distress.  HENT:  Head: Normocephalic and atraumatic.  Nose: Nose normal.  Mouth/Throat: Oropharynx is clear and moist. No oropharyngeal exudate.  No thrush Dry oral mucosa  Eyes: Pupils are equal, round, and reactive to light. Conjunctivae and EOM are normal. Left eye exhibits no discharge. No scleral icterus.  Neck: Normal range of motion. Neck supple. No thyromegaly present.  No palpable neck mass.  Cardiovascular: Normal rate, regular rhythm and normal heart sounds. Exam reveals no gallop and no friction rub.  No murmur heard. Pulmonary/Chest: Effort normal and breath sounds normal. No respiratory distress. He has no wheezes. He has no rales. He exhibits no tenderness.  Abdominal: Soft. Bowel sounds are normal. He exhibits no distension. There is no abdominal tenderness. There is no rebound.  Musculoskeletal: Normal range of motion.        General: No  tenderness, deformity or edema.  Lymphadenopathy:    He has no cervical adenopathy.  Neurological: He is alert and oriented to person, place, and time. No cranial nerve deficit. He exhibits normal muscle tone. Gait normal. Coordination normal.  Skin: Skin is warm and dry. He is not diaphoretic. No erythema.  Psychiatric: Affect normal.     LABORATORY DATA:  I have reviewed the data as listed Lab Results  Component Value Date   WBC 5.6 07/15/2018   HGB 13.3 07/15/2018   HCT 38.9 (L) 07/15/2018   MCV 91.3 07/15/2018   PLT 239 07/15/2018   No results for input(s): NA, K, CL, CO2, GLUCOSE, BUN, CREATININE, CALCIUM, GFRNONAA, GFRAA, PROT, ALBUMIN, AST, ALT, ALKPHOS, BILITOT, BILIDIR, IBILI in the last 8760 hours.  RADIOGRAPHIC STUDIES: I have personally reviewed the radiological images as listed and agreed with the findings in the report. 03/08/2018  Near complete resolution of prior large right neck mass.Small right cervical/supraclavicular nodal metastases measuring up to 10 mm short axis. No evidence of distal metastases. 07/02/2018  No evidence of residual carcinoma in the neck.  No evidence of nodal metastasis.  No evidence of distant metastasis disease.   ASSESSMENT & PLAN:   1. 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HCC)   2. Stage 3 chronic kidney disease ([place])   3. Anemia due to chemotherapy   4. Odynophagia    #08/20/2019 PET scan images were independently reviewed by me and discussed with patient. 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 He has hypermetabolic level 2 right cervical lymph node, mediastinum lymph nodes as well as lung nodule I recommend obtaining biopsy to confirm disease recurrence.  I called patient's ENT at [facility] and discussed with Dr. Geralynn Ochs about feasibility of biopsy of right level 2 cervical lymph node. Due to the small size,-8 mm, and patient's baseline fibrosis, recommends discussing with  radiology to see if ultrasound-guided biopsy of 107mm  cervical lymph node is possible or not, or if any other assessable biopsy sites.  Discussed with patient that if recurrence is confirmed via biopsy, then clinically he has metastatic disease which would not be curable.  Surgery would not be an option and recommend palliative systemic chemotherapy.  He will need to have Mediport placed again.  I recommend further discussion after biopsy comes back.  He agrees.  He request some stronger pain medication for his sore throat and neck pain.  He has tried Tylenol which is not relieved his discomfort.  I suggest patient to try Tylenol# 3, and patient declines because  it did not work previously. I gave patient a prescription of oxycodone 5 mg every 12 hours as needed dispense 30 tablets.  Prescription sent to pharmacy.  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  Continue to monitor.  If getting worse, will refer patient to gastroenterology for further evaluation.   All questions were answered. The patient knows to call the clinic with any problems questions or concerns.  Return of visit: After biopsy. We spent sufficient time to discuss many aspect of care, questions were answered to patient's satisfaction. Total face to face encounter time for this patient visit was 40 min. >50% of the time was  spent in counseling and coordination of care.   Earlie Server, MD, PhD Hematology Oncology [facility] Pager- [phone] 08/26/2019

## 2019-08-27 ENCOUNTER — Other Ambulatory Visit: Payer: Self-pay | Admitting: Oncology

## 2019-08-27 DIAGNOSIS — C109 Malignant neoplasm of oropharynx, unspecified: Secondary | ICD-10-CM

## 2019-08-28 ENCOUNTER — Telehealth: Payer: Self-pay

## 2019-08-28 ENCOUNTER — Other Ambulatory Visit: Payer: Self-pay | Admitting: Oncology

## 2019-08-28 DIAGNOSIS — C109 Malignant neoplasm of oropharynx, unspecified: Secondary | ICD-10-CM

## 2019-08-28 NOTE — Telephone Encounter (Signed)
-----   Message from Earlie Server, MD sent at 08/27/2019  3:51 PM EDT ----- Nira Conn, I discussed with ENT and IR. Will proceed with US guided biopsy of right neck lymph node. Please arrange him to get that. Pt is aware of the biopsy plan.

## 2019-08-28 NOTE — Telephone Encounter (Signed)
US guided bx order faxed to specialty schedulers.

## 2019-09-01 NOTE — Telephone Encounter (Signed)
bx scheduled for 09/08/19 arrive @ 10:00 for 11:00 appt.  Patient is aware.

## 2019-09-02 ENCOUNTER — Telehealth: Payer: Self-pay

## 2019-09-02 NOTE — Telephone Encounter (Addendum)
Spoke with Dr. Tasia Catchings and she feels that if patient is not having any new difficulties with swallowing or breathing he is stable.  Called specials and they can not get him in any sooner than his already scheduled appt.  Patient is aware and understands.

## 2019-09-02 NOTE — Telephone Encounter (Signed)
Patient reports that he feels that the cervical lymph node is putting more pressure on his windpipe.  He does not have any trouble swallowing or breathing.  But, the pain is getting worse and seems to be getting worse every day.  The Oxycodone does help the pain be more tolerable.  The biopsy is scheduled for Monday (10/5) but he feels like he doesn't need to wait that long.

## 2019-09-04 ENCOUNTER — Other Ambulatory Visit: Payer: Self-pay | Admitting: Radiology

## 2019-09-08 ENCOUNTER — Ambulatory Visit
Admission: RE | Admit: 2019-09-08 | Discharge: 2019-09-08 | Disposition: A | Discharge status: Home / Self Care | Payer: PRIVATE HEALTH INSURANCE | Source: Ambulatory Visit | Attending: Oncology | Admitting: Oncology

## 2019-09-08 ENCOUNTER — Other Ambulatory Visit: Payer: Self-pay

## 2019-09-08 DIAGNOSIS — C109 Malignant neoplasm of oropharynx, unspecified: Secondary | ICD-10-CM

## 2019-09-08 DIAGNOSIS — R490 Dysphonia: Secondary | ICD-10-CM | POA: Insufficient documentation

## 2019-09-08 DIAGNOSIS — M542 Cervicalgia: Secondary | ICD-10-CM | POA: Diagnosis not present

## 2019-09-08 DIAGNOSIS — Z85818 Personal history of malignant neoplasm of other sites of lip, oral cavity, and pharynx: Secondary | ICD-10-CM | POA: Insufficient documentation

## 2019-09-08 DIAGNOSIS — R590 Localized enlarged lymph nodes: Secondary | ICD-10-CM | POA: Insufficient documentation

## 2019-09-08 IMAGING — US US BIOPSY FNA W/ IMAGING
1 series · 13 of 18 positions shown · non-contrast
Comparison: none

INDICATION: History of squamous cell carcinoma of the throat with PET avid right
supraclavicular lymph node.

[Series 1: us biopsy fna w/ imaging · 0.04mm/px · 18 acquisitions, 13 frames shown]
[im 1/18]
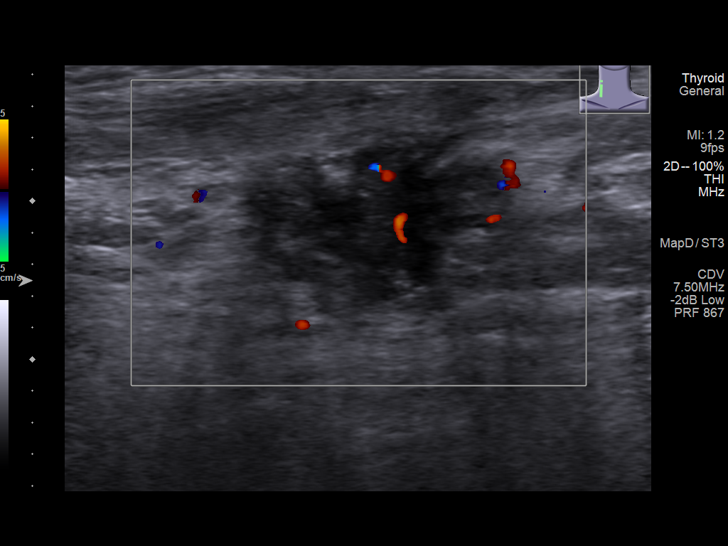
[im 3/18]
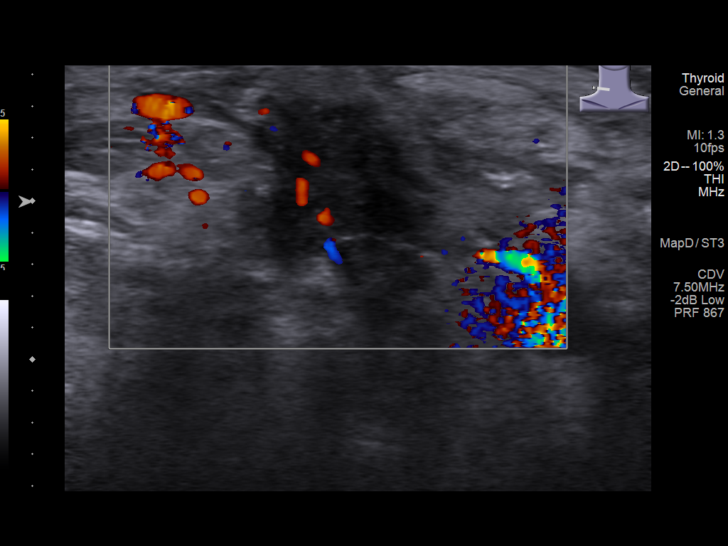
[im 4/18]
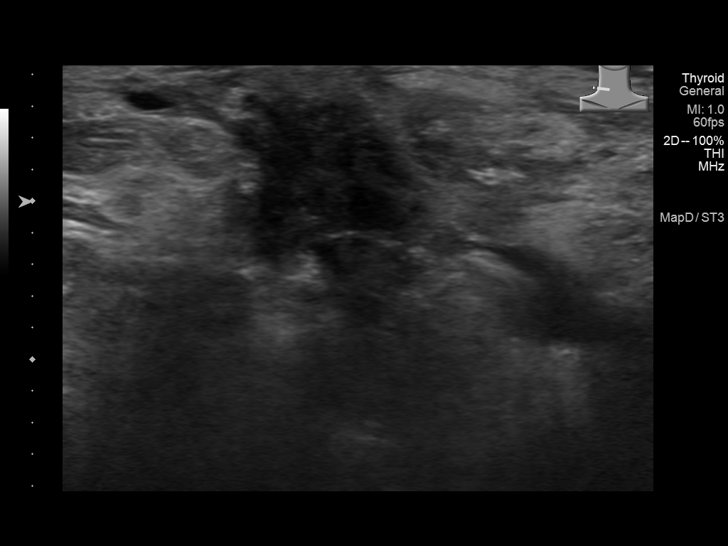
[im 5/18]
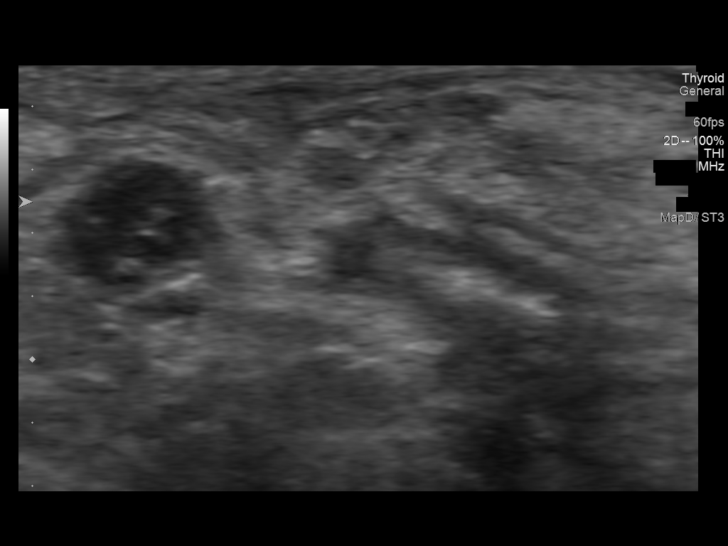
[im 7/18]
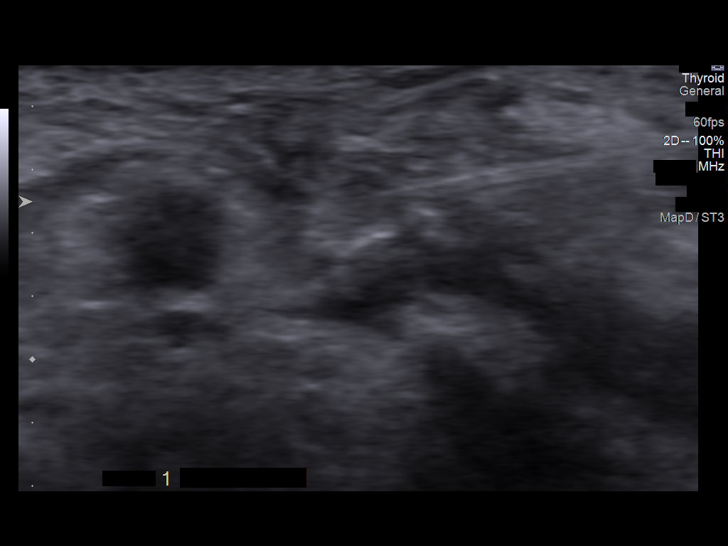
[im 8/18]
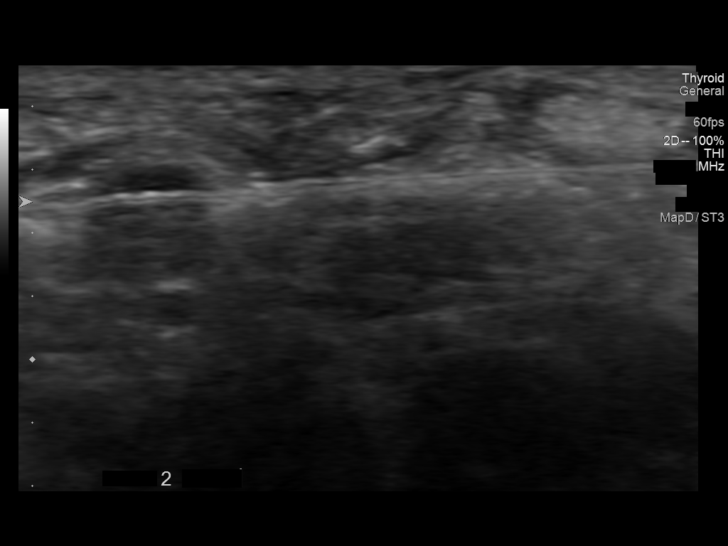
[im 10/18]
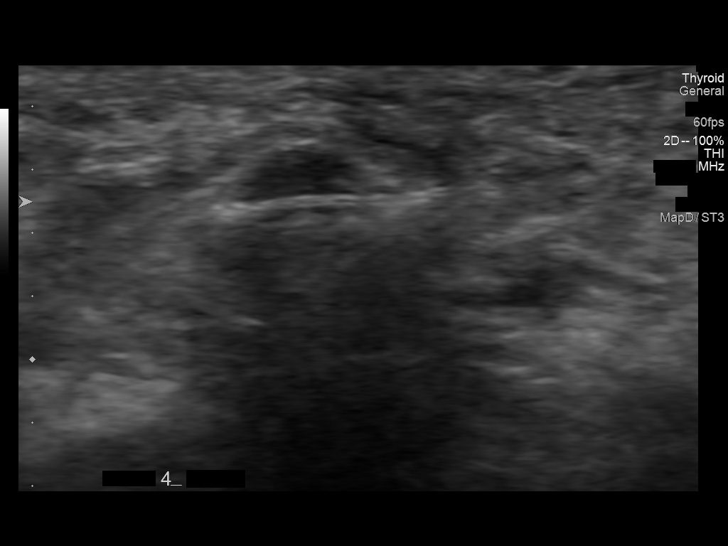
[im 11/18]
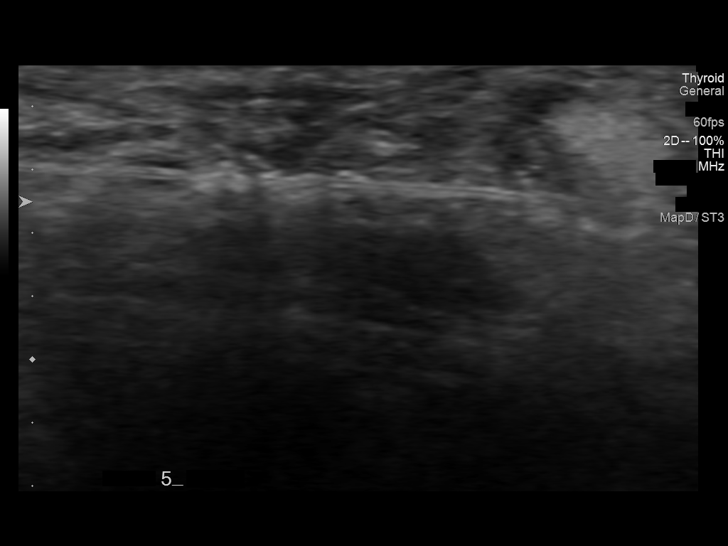
[im 12/18]
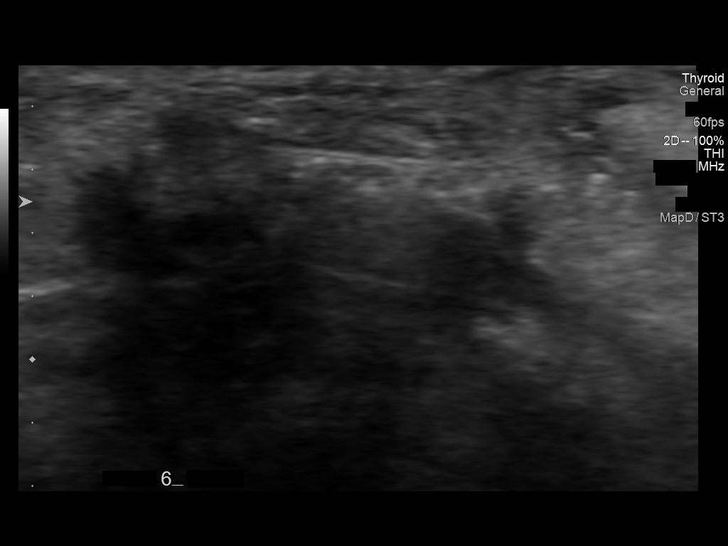
[im 14/18]
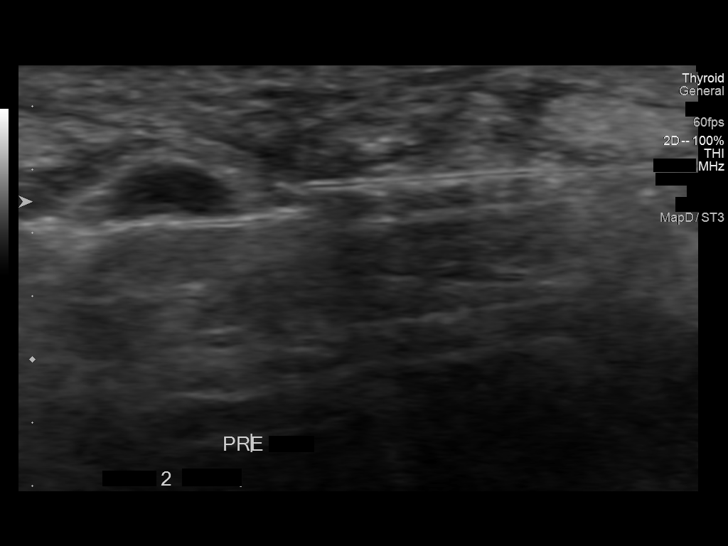
[im 15/18]
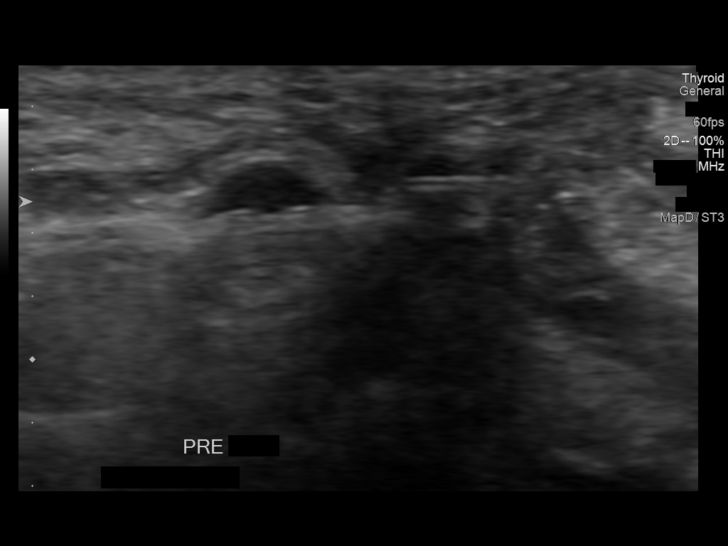
[im 16/18]
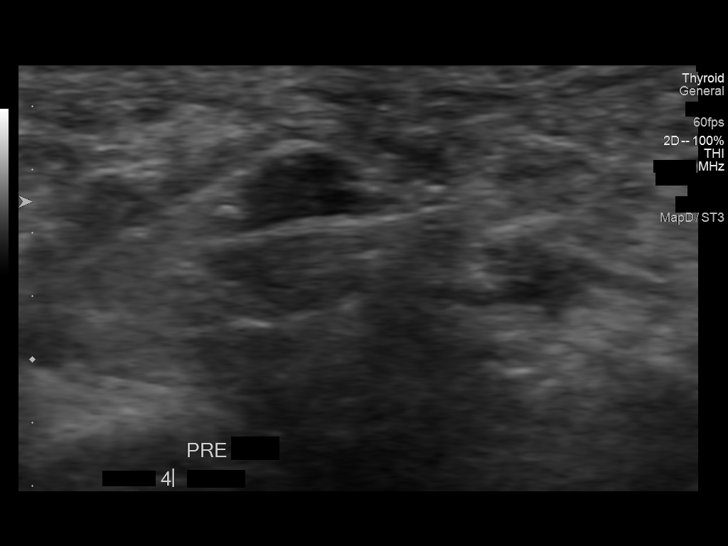
[im 18/18]
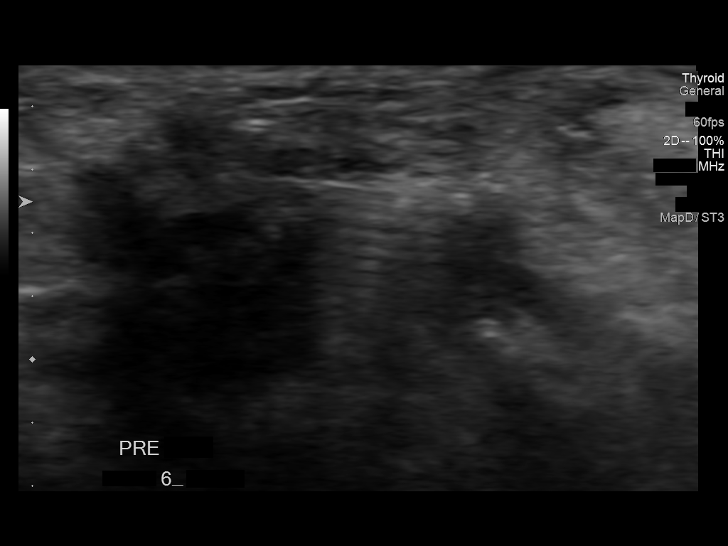

[13 of 18 positions shown; findings below may reference images not displayed]

EXAM:
ULTRASOUND-GUIDED FNA AND CORE BIOPSY OF A RIGHT SUPRACLAVICULAR
LYMPH NODE

MEDICATIONS:
None.

ANESTHESIA/SEDATION:
None

FLUOROSCOPY TIME:  Not applicable

COMPLICATIONS:
None immediate.

PROCEDURE:
Informed written consent was obtained from the patient after a
thorough discussion of the procedural risks, benefits and
alternatives. All questions were addressed. Maximal Sterile Barrier
Technique was utilized including caps, mask, sterile gowns, sterile
gloves, sterile drape, hand hygiene and skin antiseptic. A timeout
was performed prior to the initiation of the procedure.

The patient was then prepped and draped in the sterile manner with
chlorhexidine. Utilizing 1% xylocaine as local anesthetic and
real-time ultrasound guidance a fine-needle aspiration was performed
utilizing a 25 gauge needle. This sample was given to the
cytotechnologist and deemed adequate. Subsequently multiple core
biopsies were obtained of the right supraclavicular lymph node as
well as a hypoechoic area surrounding the lymph nodes suspicious for
localized invasion. These cores were given to the cytotechnologist
for pathologic evaluation. Patient tolerated the procedure well.
Needle was removed and hemostasis obtained at the puncture site.
Patient was then returned his room in satisfactory condition.
IMPRESSION: Successful fine-needle aspiration and core biopsy of a right
supraclavicular lymph node.

## 2019-09-08 MED ORDER — SODIUM CHLORIDE 0.9 % IV SOLN: INTRAVENOUS | Status: DC

## 2019-09-08 NOTE — Progress Notes (Signed)
Patient remains stable, taking po's without difficulty.discharge instructions given with questions answered. Denies complaints. Ready for discharge at this time.

## 2019-09-08 NOTE — Discharge Instructions (Signed)
Needle Biopsy, Care After °These instructions tell you how to care for yourself after your procedure. Your doctor may also give you more specific instructions. Call your doctor if you have any problems or questions. °What can I expect after the procedure? °After the procedure, it is common to have: °· Soreness. °· Bruising. °· Mild pain. °Follow these instructions at home: ° °· Return to your normal activities as told by your doctor. Ask your doctor what activities are safe for you. °· Take over-the-counter and prescription medicines only as told by your doctor. °· Wash your hands with soap and water before you change your bandage (dressing). If you cannot use soap and water, use hand sanitizer. °· Follow instructions from your doctor about: °? How to take care of your puncture site. °? When and how to change your bandage. °? When to remove your bandage. °· Check your puncture site every day for signs of infection. Watch for: °? Redness, swelling, or pain. °? Fluid or blood.  °? Pus or a bad smell. °? Warmth. °· Do not take baths, swim, or use a hot tub until your doctor approves. Ask your doctor if you may take showers. You may only be allowed to take sponge baths. °· Keep all follow-up visits as told by your doctor. This is important. °Contact a doctor if you have: °· A fever. °· Redness, swelling, or pain at the puncture site, and it lasts longer than a few days. °· Fluid, blood, or pus coming from the puncture site. °· Warmth coming from the puncture site. °Get help right away if: °· You have a lot of bleeding from the puncture site. °Summary °· After the procedure, it is common to have soreness, bruising, or mild pain at the puncture site. °· Check your puncture site every day for signs of infection, such as redness, swelling, or pain. °· Get help right away if you have severe bleeding from your puncture site. °This information is not intended to replace advice given to you by your health care provider. Make  sure you discuss any questions you have with your health care provider. °Document Released: 11/02/2008 Document Revised: 12/03/2017 Document Reviewed: 12/03/2017 °Elsevier Patient Education © 2020 [employer] ° °

## 2019-09-08 NOTE — Procedures (Signed)
US guided FNA and core biopsy of right supraclav node   Complications:  None  Blood Loss: none  See dictation in canopy pacs

## 2019-09-09 ENCOUNTER — Encounter: Payer: Self-pay | Admitting: Oncology

## 2019-09-09 ENCOUNTER — Other Ambulatory Visit: Payer: Self-pay

## 2019-09-09 LAB — CYTOLOGY - NON PAP

## 2019-09-09 LAB — SURGICAL PATHOLOGY

## 2019-09-09 NOTE — Progress Notes (Signed)
Patient stated that he had been doing well with no complaints. Patient stated that he would like to know what the plan is.

## 2019-09-10 ENCOUNTER — Telehealth (INDEPENDENT_AMBULATORY_CARE_PROVIDER_SITE_OTHER): Payer: Self-pay

## 2019-09-10 ENCOUNTER — Other Ambulatory Visit: Payer: Self-pay

## 2019-09-10 ENCOUNTER — Encounter: Payer: Self-pay | Admitting: Oncology

## 2019-09-10 ENCOUNTER — Inpatient Hospital Stay: Payer: PRIVATE HEALTH INSURANCE | Attending: Oncology | Admitting: Oncology

## 2019-09-10 VITALS — BP 123/79 | HR 91 | Temp 97.3°F | Resp 18 | Wt 175.1 lb

## 2019-09-10 DIAGNOSIS — I129 Hypertensive chronic kidney disease with stage 1 through stage 4 chronic kidney disease, or unspecified chronic kidney disease: Secondary | ICD-10-CM | POA: Diagnosis not present

## 2019-09-10 DIAGNOSIS — C109 Malignant neoplasm of oropharynx, unspecified: Secondary | ICD-10-CM | POA: Insufficient documentation

## 2019-09-10 DIAGNOSIS — C7800 Secondary malignant neoplasm of unspecified lung: Secondary | ICD-10-CM | POA: Diagnosis not present

## 2019-09-10 DIAGNOSIS — C770 Secondary and unspecified malignant neoplasm of lymph nodes of head, face and neck: Secondary | ICD-10-CM | POA: Insufficient documentation

## 2019-09-10 DIAGNOSIS — Z5112 Encounter for antineoplastic immunotherapy: Secondary | ICD-10-CM | POA: Insufficient documentation

## 2019-09-10 DIAGNOSIS — R490 Dysphonia: Secondary | ICD-10-CM | POA: Insufficient documentation

## 2019-09-10 DIAGNOSIS — Z7189 Other specified counseling: Secondary | ICD-10-CM | POA: Diagnosis not present

## 2019-09-10 DIAGNOSIS — Z5111 Encounter for antineoplastic chemotherapy: Secondary | ICD-10-CM | POA: Insufficient documentation

## 2019-09-10 DIAGNOSIS — Z79899 Other long term (current) drug therapy: Secondary | ICD-10-CM | POA: Diagnosis not present

## 2019-09-10 DIAGNOSIS — N1831 Chronic kidney disease, stage 3a: Secondary | ICD-10-CM | POA: Diagnosis not present

## 2019-09-10 MED ORDER — ONDANSETRON HCL 8 MG PO TABS: 8.0000 mg | ORAL_TABLET | Freq: Two times a day (BID) | ORAL | 1 refills | Status: DC | PRN

## 2019-09-10 MED ORDER — DEXAMETHASONE 4 MG PO TABS: 8.0000 mg | ORAL_TABLET | Freq: Every day | ORAL | 1 refills | Status: DC

## 2019-09-10 MED ORDER — PROCHLORPERAZINE MALEATE 10 MG PO TABS: 10.0000 mg | ORAL_TABLET | Freq: Four times a day (QID) | ORAL | 1 refills | Status: AC | PRN

## 2019-09-10 MED ORDER — LIDOCAINE-PRILOCAINE 2.5-2.5 % EX CREA: TOPICAL_CREAM | CUTANEOUS | 3 refills | Status: AC

## 2019-09-10 NOTE — Progress Notes (Signed)
DISCONTINUE ON PATHWAY REGIMEN - Head and Neck     Administer weekly:     Carboplatin   **Always confirm dose/schedule in your pharmacy ordering system**  REASON: Disease Progression PRIOR TREATMENT: [id number]: Carboplatin AUC = 2 Weekly with Concurrent Radiation TREATMENT RESPONSE: Progressive Disease (PD)  START ON PATHWAY REGIMEN - Head and Neck     A cycle is every 21 days:     Carboplatin      Fluorouracil      Pembrolizumab   **Always confirm dose/schedule in your pharmacy ordering system**  Patient Characteristics: Oropharynx, HPV Positive, Metastatic, First Line, No Prior Platinum-Based Chemoradiation within 6 Months, PD-L1 Expression Negative (CPS < 1) / Unknown Disease Classification: Oropharynx HPV Status: Positive (+) AJCC N Category: NX AJCC 8 Stage Grouping: IV Current Disease Status: Metastatic Disease AJCC T Category: T1 AJCC M Category: M1 Line of Therapy: First Line Prior Platinum Status: No Prior Platinum-Based Chemoradiation within 6 Months PD-L1 Expression Status: Awaiting Test Results Intent of Therapy: Non-Curative / Palliative Intent, Discussed with Patient

## 2019-09-10 NOTE — Progress Notes (Signed)
Hematology/Oncology  Follow Up note [facility] [phone] [phone]   Patient Care Team: Albina Billet, MD as PCP - General (Internal Medicine) Albina Billet, MD (Internal Medicine) Bary Castilla, Forest Gleason, MD (General Surgery) Noreene Filbert, MD as Referring Physician (Radiation Oncology) Earlie Server, MD as Consulting Physician (Oncology)  REFERRING PROVIDER: Dr.McQueen CHIEF COMPLAINTS/PURPOSE OF CONSULTATION:  Evaluation of newly diagnosed head and neck cancer.  HISTORY OF PRESENTING ILLNESS:  Ivan Garcia. is a  63 y.o.  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 P16 positive. cT2 cN2 disease, stage II, Concurrent ChemoRT  Cisplatin 100 mg/m2 q3weeks with concurrent RT which started on 12/31/2017.  S/p one dose of Cisplatin. Cisplatin discontinued due to nephrotoxicity. Switch to weekly Carboplatin (AUC 2) / Taxol 45mg /m2 [ finished May 2019] Patient has finished concurrent chemoradiation in May 2019  # He reports doing well until in July 2020, he started to have hoarseness after singing for 2 to 3 hours. He has changed his insurance and Dr. Tami Ribas is no longer covered by his current insurance.  So he establish care with Dr.Lynnae Ludemann Juliann Pulse at [facility] and was seen on 08/04/2019. Flexible laryngoscopy showed Vocal cord paralysis.  Patient was recommended to have a PET scan done for restaging. Patient's primary care provider Dr. Hall Busing ordered a PET scan for patient which was done on 08/20/2019.  PET scan unfortunately showed metastatic disease.  Patient called cancer center and is scheduled follow-up with me. 08/20/2019 PET scan showed asymmetric hypermetabolic him involving the left vocal cords with hypermetabolic adenopathy in the neck, and the chest.  He also has hypermetabolic pulmonary nodule along the minor  fissure which is felt to be metastatic. Tiny proximal left ureteral stone without hydronephrosis.  Possible gallbladder sludge.  Enlarged prostate.  Atherosclerosis.    INTERVAL HISTORY [name]. is a 63 y.o. male who presents for follow-up stage II squamous cell carcinoma, P 16+.  And discussion of PET scan results. #Patient underwent ultrasound-guided right supraclavicular lymph node biopsy Pathology was positive for metastatic squamous cell carcinoma. Patient is here to discuss biopsy results and management plan. Continues to have hoarseness.  During the interval patient was seen by dentist for tooth pain. Continue to have chronic odynophagia.  mild  Review of Systems  Constitutional: Negative for chills, fever, malaise/fatigue and weight loss.  HENT: Negative for sore throat.        Sore throat, hoarseness  neck pain  Eyes: Negative for redness.  Respiratory: Negative for cough, shortness of breath and wheezing.   Cardiovascular: Negative for chest pain, palpitations and leg swelling.  Gastrointestinal: Negative for abdominal pain, blood in stool, nausea and vomiting.  Genitourinary: Negative for dysuria.  Musculoskeletal: Negative for myalgias.  Skin: Negative for rash.  Neurological: Negative for dizziness, tingling and tremors.  Endo/Heme/Allergies: Does not bruise/bleed easily.  Psychiatric/Behavioral: Negative for hallucinations.    MEDICAL HISTORY:  Past Medical History:  Diagnosis Date  . Arthritis   . Cancer ([place])    Head and neck cancer  . Hemorrhoids   . Hyperlipidemia   . Hypertension     SURGICAL HISTORY: Past Surgical History:  Procedure Laterality Date  . COLONOSCOPY WITH PROPOFOL N/A 04/04/2017   Procedure: COLONOSCOPY WITH PROPOFOL;  Surgeon: Robert Bellow, MD;  Location: [facility] ENDOSCOPY;  Service: Endoscopy;  Laterality: N/A;  . MANDIBLE SURGERY    .  PORT-A-CATH REMOVAL  07/17/2018  . PORTA CATH INSERTION N/A 12/19/2017   Procedure:  PORTA CATH INSERTION;  Surgeon: Algernon Huxley, MD;  Location: [place] [facility];  Service: Cardiovascular;  Laterality: N/A;  . PORTA CATH INSERTION N/A 07/17/2018   Procedure: PORTA CATH INSERTION;  Surgeon: Algernon Huxley, MD;  Location: [place] [facility];  Service: Cardiovascular;  Laterality: N/A;  . TONSILLECTOMY      SOCIAL HISTORY: Social History   Socioeconomic History  . Marital status: Single    Spouse name: Not on file  . Number of children: Not on file  . Years of education: Not on file  . Highest education level: Not on file  Occupational History  . Not on file  Social Needs  . Financial resource strain: Not on file  . Food insecurity    Worry: Not on file    Inability: Not on file  . Transportation needs    Medical: Not on file    Non-medical: Not on file  Tobacco Use  . Smoking status: Never Smoker  . Smokeless tobacco: Never Used  Substance and Sexual Activity  . Alcohol use: No  . Drug use: No  . Sexual activity: Not on file  Lifestyle  . Physical activity    Days per week: Not on file    Minutes per session: Not on file  . Stress: Not on file  Relationships  . Social Herbalist on phone: Not on file    Gets together: Not on file    Attends religious service: Not on file    Active member of club or organization: Not on file    Attends meetings of clubs or organizations: Not on file    Relationship status: Not on file  . Intimate partner violence    Fear of current or ex partner: Not on file    Emotionally abused: Not on file    Physically abused: Not on file    Forced sexual activity: Not on file  Other Topics Concern  . Not on file  Social History Narrative  . Not on file    FAMILY HISTORY: Family History  Problem Relation Age of Onset  . Breast cancer Mother   . Asthma Mother   . Congestive Heart Failure Mother   . Prostate cancer Father   . Brain cancer Father   . Bladder Cancer Father     ALLERGIES:  has No Known  Allergies.  MEDICATIONS:  Current Outpatient Medications  Medication Sig Dispense Refill  . amLODipine (NORVASC) 2.5 MG tablet Take 1 tablet (2.5 mg total) by mouth daily. (Patient taking differently: Take 2.5 mg by mouth daily. Patient now taking 5 mg daily (2 tabs)) 30 tablet 0  . Multiple Vitamins-Minerals (MULTIVITAMIN WITH MINERALS) tablet Take 1 tablet by mouth daily.    . rosuvastatin (CRESTOR) 10 MG tablet Take 10 mg by mouth every evening.  4  . dexamethasone (DECADRON) 4 MG tablet Take 2 tablets (8 mg total) by mouth daily. Start the day after carboplatin chemotherapy for 3 days. 30 tablet 1  . lidocaine-prilocaine (EMLA) cream Apply to affected area once 30 g 3  . ondansetron (ZOFRAN) 8 MG tablet Take 1 tablet (8 mg total) by mouth 2 (two) times daily as needed for refractory nausea / vomiting. Start on day 3 after carboplatin chemo. 30 tablet 1  . oxyCODONE (ROXICODONE) 5 MG immediate release tablet Take 1 tablet (5 mg total) by mouth every 12 (  twelve) hours as needed for moderate pain or severe pain. (Patient not taking: Reported on 09/09/2019) 30 tablet 0  . prochlorperazine (COMPAZINE) 10 MG tablet Take 1 tablet (10 mg total) by mouth every 6 (six) hours as needed (Nausea or vomiting). 30 tablet 1   No current facility-administered medications for this visit.      PHYSICAL EXAMINATION: ECOG PERFORMANCE STATUS: 0 - Asymptomatic Vitals:   09/10/19 0915  BP: 123/79  Pulse: 91  Resp: 18  Temp: (!) 97.3 F (36.3 C)   Physical Exam  Constitutional: He is oriented to person, place, and time and well-developed, well-nourished, and in no distress. No distress.  HENT:  Head: Normocephalic and atraumatic.  Mouth/Throat: No oropharyngeal exudate.  No thrush Dry oral mucosa  Eyes: Pupils are equal, round, and reactive to light. Conjunctivae and EOM are normal. Left eye exhibits no discharge. No scleral icterus.  Neck: Normal range of motion. Neck supple. No thyromegaly present.   No palpable neck mass.  Cardiovascular: Normal rate, regular rhythm and normal heart sounds. Exam reveals no gallop and no friction rub.  No murmur heard. Pulmonary/Chest: Effort normal and breath sounds normal. No respiratory distress. He has no wheezes.  Abdominal: Soft. Bowel sounds are normal. He exhibits no distension. There is no abdominal tenderness. There is no rebound.  Musculoskeletal: Normal range of motion.        General: No tenderness, deformity or edema.  Lymphadenopathy:    He has no cervical adenopathy.  Neurological: He is alert and oriented to person, place, and time. He exhibits normal muscle tone.  Skin: Skin is warm and dry. He is not diaphoretic. No erythema.  Psychiatric: Affect normal.     LABORATORY DATA:  I have reviewed the data as listed Lab Results  Component Value Date   WBC 5.6 07/15/2018   HGB 13.3 07/15/2018   HCT 38.9 (L) 07/15/2018   MCV 91.3 07/15/2018   PLT 239 07/15/2018   No results for input(s): NA, K, CL, CO2, GLUCOSE, BUN, CREATININE, CALCIUM, GFRNONAA, GFRAA, PROT, ALBUMIN, AST, ALT, ALKPHOS, BILITOT, BILIDIR, IBILI in the last 8760 hours.  RADIOGRAPHIC STUDIES: I have personally reviewed the radiological images as listed and agreed with the findings in the report. 03/08/2018  Near complete resolution of prior large right neck mass.Small right cervical/supraclavicular nodal metastases measuring up to 10 mm short axis. No evidence of distal metastases. 07/02/2018  No evidence of residual carcinoma in the neck.  No evidence of nodal metastasis.  No evidence of distant metastasis disease.   ASSESSMENT & PLAN:   1. 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[place])   2. Goals of care, counseling/discussion   3. Stage 3a chronic kidney disease    #Metastatic squamous cell carcinoma of oropharynx Pathology result was reviewed and discussed with patient.  Consistent with metastatic head and neck cancer. The diagnosis of metastatic head and neck  cancer and care plan were discussed with patient in detail.  NCCN guidelines were reviewed and shared with patient.   The goal of treatment which is to palliate disease, disease related symptoms, improve quality of life and hopefully prolong life was highlighted in our discussion.  Chemotherapy education was provided.  We had discussed the composition of chemotherapy regimen, length of chemo cycle, duration of treatment and the time to assess response to treatment.    I explained to the patient the risks and benefits of chemotherapy pembrolizumab, 5-FU, carboplatin including all but not limited to hair loss, mouth sore, nausea, vomiting,  diarrhea, skin rash, low blood counts, bleeding, neuropathy and risk of life threatening infection and even death, secondary malignancy etc.   I discussed the mechanism of action and rationale of using immunotherapy.  The goal of therapy is palliative; and length of treatments are likely ongoing/based upon the results of the scans. Discussed the potential side effects of immunotherapy including but not limited to diarrhea; skin rash; respiratory failure, kidney failure, mental status change, elevated LFTs/liver failure,endocrine abnormalities, acute deterioration  and even death,etc. patient voices understanding and agrees with proceeding with treatment.   #Refer patient back to vascular surgery for Medi- port placement. Antiemetics-Zofran and Compazine; EMLA cream sent to pharmacy Obtain baseline CBC and CMP. Supportive care measures are necessary for patient well-being and will be provided as necessary. We spent sufficient time to discuss many aspect of care, questions were answered to patient's satisfaction.  #Patient requests copies of PET scan report and pathology reports and he wants to discuss with his friend who is a Materials engineer from [place].  He will let me know if he would like to proceed with chemotherapy locally.  All questions were answered. The patient  knows to call the clinic with any problems questions or concerns.  Return of visit: To be determined. We spent sufficient time to discuss many aspect of care, questions were answered to patient's satisfaction. Total face to face encounter time for this patient visit was 40 min. >50% of the time was  spent in counseling and coordination of care.   Earlie Server, MD, PhD Hematology Oncology [facility] Pager- [phone] 09/10/2019

## 2019-09-10 NOTE — Telephone Encounter (Signed)
Spoke with the patient and he is now scheduled with Dr. Lucky Cowboy for port placement on 09/18/2019 with a 9:00 am arrival time to the [facility]. Patient will do his Covid testing is on 09/15/2019 between 12-2:30 pm at the [facility]. Pre-procedure instructions were discussed and will be mailed to the patient.

## 2019-09-11 ENCOUNTER — Telehealth: Payer: Self-pay

## 2019-09-11 NOTE — Telephone Encounter (Signed)
Patient called wanting to know if he should be quarantined from his Covid test on Monday thru Thursday, I explained that he should be quarantined.

## 2019-09-11 NOTE — Telephone Encounter (Signed)
Patient left voicemail stating that he will have covid testing on 10/12 and port placement on 10/15. He wants to know whether his sister (coming from out of town) would be able to stay with him due to isolation. I called Pt called and he states that he will call Dr. Bunnie Domino office and ask since testing was scheduled by their office.

## 2019-09-11 NOTE — Telephone Encounter (Signed)
Omniseq request sent to [facility] pathology. Fax confirmation received.

## 2019-09-15 ENCOUNTER — Other Ambulatory Visit
Admission: RE | Admit: 2019-09-15 | Discharge: 2019-09-15 | Disposition: A | Discharge status: Home / Self Care | Payer: PRIVATE HEALTH INSURANCE | Source: Ambulatory Visit | Attending: Vascular Surgery | Admitting: Vascular Surgery

## 2019-09-15 DIAGNOSIS — Z20828 Contact with and (suspected) exposure to other viral communicable diseases: Secondary | ICD-10-CM | POA: Insufficient documentation

## 2019-09-15 DIAGNOSIS — Z01812 Encounter for preprocedural laboratory examination: Secondary | ICD-10-CM | POA: Diagnosis not present

## 2019-09-15 LAB — SARS CORONAVIRUS 2 (TAT 6-24 HRS): SARS Coronavirus 2: NEGATIVE

## 2019-09-17 ENCOUNTER — Other Ambulatory Visit (INDEPENDENT_AMBULATORY_CARE_PROVIDER_SITE_OTHER): Payer: Self-pay | Admitting: Nurse Practitioner

## 2019-09-17 MED ORDER — SODIUM CHLORIDE 0.9 % IV SOLN
Freq: Once | INTRAVENOUS | Status: DC
  Filled 2019-09-17: qty 2

## 2019-09-18 ENCOUNTER — Telehealth: Payer: Self-pay

## 2019-09-18 ENCOUNTER — Other Ambulatory Visit: Payer: Self-pay | Admitting: Oncology

## 2019-09-18 ENCOUNTER — Encounter: Payer: Self-pay | Admitting: Vascular Surgery

## 2019-09-18 ENCOUNTER — Ambulatory Visit
Admission: RE | Admit: 2019-09-18 | Discharge: 2019-09-18 | Disposition: A | Discharge status: Home / Self Care | Payer: PRIVATE HEALTH INSURANCE | Attending: Vascular Surgery | Admitting: Vascular Surgery

## 2019-09-18 ENCOUNTER — Encounter
Admission: RE | Disposition: A | Discharge status: Home / Self Care | Payer: Self-pay | Source: Home / Self Care | Attending: Vascular Surgery

## 2019-09-18 DIAGNOSIS — M1990 Unspecified osteoarthritis, unspecified site: Secondary | ICD-10-CM | POA: Diagnosis not present

## 2019-09-18 DIAGNOSIS — Z79899 Other long term (current) drug therapy: Secondary | ICD-10-CM | POA: Diagnosis not present

## 2019-09-18 DIAGNOSIS — Z803 Family history of malignant neoplasm of breast: Secondary | ICD-10-CM | POA: Insufficient documentation

## 2019-09-18 DIAGNOSIS — Z808 Family history of malignant neoplasm of other organs or systems: Secondary | ICD-10-CM | POA: Insufficient documentation

## 2019-09-18 DIAGNOSIS — Z8249 Family history of ischemic heart disease and other diseases of the circulatory system: Secondary | ICD-10-CM | POA: Insufficient documentation

## 2019-09-18 DIAGNOSIS — Z8052 Family history of malignant neoplasm of bladder: Secondary | ICD-10-CM | POA: Diagnosis not present

## 2019-09-18 DIAGNOSIS — N1831 Chronic kidney disease, stage 3a: Secondary | ICD-10-CM | POA: Insufficient documentation

## 2019-09-18 DIAGNOSIS — C109 Malignant neoplasm of oropharynx, unspecified: Secondary | ICD-10-CM

## 2019-09-18 DIAGNOSIS — E785 Hyperlipidemia, unspecified: Secondary | ICD-10-CM | POA: Diagnosis not present

## 2019-09-18 DIAGNOSIS — I129 Hypertensive chronic kidney disease with stage 1 through stage 4 chronic kidney disease, or unspecified chronic kidney disease: Secondary | ICD-10-CM | POA: Insufficient documentation

## 2019-09-18 DIAGNOSIS — Z8042 Family history of malignant neoplasm of prostate: Secondary | ICD-10-CM | POA: Insufficient documentation

## 2019-09-18 DIAGNOSIS — Z7189 Other specified counseling: Secondary | ICD-10-CM

## 2019-09-18 HISTORY — PX: PORTA CATH INSERTION: CATH118285

## 2019-09-18 SURGERY — PORTA CATH INSERTION
Anesthesia: Moderate Sedation

## 2019-09-18 MED ORDER — CEFAZOLIN SODIUM-DEXTROSE 2-4 GM/100ML-% IV SOLN
INTRAVENOUS | Status: AC
  Administered 2019-09-18: 2 g via INTRAVENOUS
  Filled 2019-09-18: qty 100

## 2019-09-18 MED ORDER — DIPHENHYDRAMINE HCL 50 MG/ML IJ SOLN
50.0000 mg | Freq: Once | INTRAMUSCULAR | Status: AC | PRN
  Administered 2019-09-18: 10:00:00 50 mg via INTRAVENOUS

## 2019-09-18 MED ORDER — MIDAZOLAM HCL 2 MG/ML PO SYRP: 8.0000 mg | ORAL_SOLUTION | Freq: Once | ORAL | Status: DC | PRN

## 2019-09-18 MED ORDER — FENTANYL CITRATE (PF) 100 MCG/2ML IJ SOLN
INTRAMUSCULAR | Status: DC | PRN
  Administered 2019-09-18: 25 ug via INTRAVENOUS
  Administered 2019-09-18: 50 ug via INTRAVENOUS

## 2019-09-18 MED ORDER — ONDANSETRON HCL 4 MG/2ML IJ SOLN: 4.0000 mg | Freq: Four times a day (QID) | INTRAMUSCULAR | Status: DC | PRN

## 2019-09-18 MED ORDER — HYDROMORPHONE HCL 1 MG/ML IJ SOLN: 1.0000 mg | Freq: Once | INTRAMUSCULAR | Status: DC | PRN

## 2019-09-18 MED ORDER — FENTANYL CITRATE (PF) 100 MCG/2ML IJ SOLN
INTRAMUSCULAR | Status: AC
  Filled 2019-09-18: qty 2

## 2019-09-18 MED ORDER — SODIUM CHLORIDE 0.9 % IV SOLN
INTRAVENOUS | Status: DC
  Administered 2019-09-18: 10:00:00 via INTRAVENOUS

## 2019-09-18 MED ORDER — MIDAZOLAM HCL 5 MG/5ML IJ SOLN
INTRAMUSCULAR | Status: AC
  Filled 2019-09-18: qty 5

## 2019-09-18 MED ORDER — MIDAZOLAM HCL 2 MG/2ML IJ SOLN
INTRAMUSCULAR | Status: DC | PRN
  Administered 2019-09-18: 2 mg via INTRAVENOUS
  Administered 2019-09-18: 1 mg via INTRAVENOUS

## 2019-09-18 MED ORDER — CEFAZOLIN SODIUM-DEXTROSE 2-4 GM/100ML-% IV SOLN
2.0000 g | Freq: Once | INTRAVENOUS | Status: AC
  Administered 2019-09-18: 11:00:00 2 g via INTRAVENOUS

## 2019-09-18 MED ORDER — METHYLPREDNISOLONE SODIUM SUCC 125 MG IJ SOLR: 125.0000 mg | Freq: Once | INTRAMUSCULAR | Status: DC | PRN

## 2019-09-18 MED ORDER — FAMOTIDINE 20 MG PO TABS: 40.0000 mg | ORAL_TABLET | Freq: Once | ORAL | Status: DC | PRN

## 2019-09-18 MED ORDER — SODIUM CHLORIDE 0.9 % IV SOLN
Freq: Once | INTRAVENOUS | Status: DC
  Administered 2019-09-18: 11:00:00
  Filled 2019-09-18: qty 80

## 2019-09-18 SURGICAL SUPPLY — 8 items
KIT PORT POWER 8FR ISP CVUE (Port) ×3 IMPLANT
PACK ANGIOGRAPHY (CUSTOM PROCEDURE TRAY) ×3 IMPLANT
PAD GROUND ADULT SPLIT (MISCELLANEOUS) ×3 IMPLANT
PENCIL ELECTRO HAND CTR (MISCELLANEOUS) ×3 IMPLANT
SUT MNCRL AB 4-0 PS2 18 (SUTURE) ×3 IMPLANT
SUT PROLENE 0 CT 1 30 (SUTURE) ×3 IMPLANT
SUT VIC AB 3-0 SH 27 (SUTURE) ×2
SUT VIC AB 3-0 SH 27X BRD (SUTURE) ×1 IMPLANT

## 2019-09-18 NOTE — Discharge Instructions (Signed)
Moderate Conscious Sedation, Adult, Care After °These instructions provide you with information about caring for yourself after your procedure. Your health care provider may also give you more specific instructions. Your treatment has been planned according to current medical practices, but problems sometimes occur. Call your health care provider if you have any problems or questions after your procedure. °What can I expect after the procedure? °After your procedure, it is common: °· To feel sleepy for several hours. °· To feel clumsy and have poor balance for several hours. °· To have poor judgment for several hours. °· To vomit if you eat too soon. °Follow these instructions at home: °For at least 24 hours after the procedure: ° °· Do not: °? Participate in activities where you could fall or become injured. °? Drive. °? Use heavy machinery. °? Drink alcohol. °? Take sleeping pills or medicines that cause drowsiness. °? Make important decisions or sign legal documents. °? Take care of children on your own. °· Rest. °Eating and drinking °· Follow the diet recommended by your health care provider. °· If you vomit: °? Drink water, juice, or soup when you can drink without vomiting. °? Make sure you have little or no nausea before eating solid foods. °General instructions °· Have a responsible adult stay with you until you are awake and alert. °· Take over-the-counter and prescription medicines only as told by your health care provider. °· If you smoke, do not smoke without supervision. °· Keep all follow-up visits as told by your health care provider. This is important. °Contact a health care provider if: °· You keep feeling nauseous or you keep vomiting. °· You feel light-headed. °· You develop a rash. °· You have a fever. °Get help right away if: °· You have trouble breathing. °This information is not intended to replace advice given to you by your health care provider. Make sure you discuss any questions you have  with your health care provider. °Document Released: 09/10/2013 Document Revised: 11/02/2017 Document Reviewed: 03/11/2016 °Elsevier Patient Education © 2020 Elsevier Inc[employer] °Implanted Port Insertion, Care After °This sheet gives you information about how to care for yourself after your procedure. Your health care provider may also give you more specific instructions. If you have problems or questions, contact your health care provider. °What can I expect after the procedure? °After the procedure, it is common to have: °· Discomfort at the port insertion site. °· Bruising on the skin over the port. This should improve over 3-4 days. °Follow these instructions at home: °Port care °· After your port is placed, you will get a manufacturer's information card. The card has information about your port. Keep this card with you at all times. °· Take care of the port as told by your health care provider. Ask your health care provider if you or a family member can get training for taking care of the port at home. A home health care nurse may also take care of the port. °· Make sure to remember what type of port you have. °Incision care ° °  ° °· Follow instructions from your health care provider about how to take care of your port insertion site. Make sure you: °? Wash your hands with soap and water before and after you change your bandage (dressing). If soap and water are not available, use hand sanitizer. °? Change your dressing as told by your health care provider. °? Leave stitches (sutures), skin glue, or adhesive strips in place. These skin closures may   need to stay in place for 2 weeks or longer. If adhesive strip edges start to loosen and curl up, you may trim the loose edges. Do not remove adhesive strips completely unless your health care provider tells you to do that. °· Check your port insertion site every day for signs of infection. Check for: °? Redness, swelling, or pain. °? Fluid or blood. °? Warmth. °? Pus or a  bad smell. °Activity °· Return to your normal activities as told by your health care provider. Ask your health care provider what activities are safe for you. °· Do not lift anything that is heavier than 10 lb (4.5 kg), or the limit that you are told, until your health care provider says that it is safe. °General instructions °· Take over-the-counter and prescription medicines only as told by your health care provider. °· Do not take baths, swim, or use a hot tub until your health care provider approves. Ask your health care provider if you may take showers. You may only be allowed to take sponge baths. °· Do not drive for 24 hours if you were given a sedative during your procedure. °· Wear a medical alert bracelet in case of an emergency. This will tell any health care providers that you have a port. °· Keep all follow-up visits as told by your health care provider. This is important. °Contact a health care provider if: °· You cannot flush your port with saline as directed, or you cannot draw blood from the port. °· You have a fever or chills. °· You have redness, swelling, or pain around your port insertion site. °· You have fluid or blood coming from your port insertion site. °· Your port insertion site feels warm to the touch. °· You have pus or a bad smell coming from the port insertion site. °Get help right away if: °· You have chest pain or shortness of breath. °· You have bleeding from your port that you cannot control. °Summary °· Take care of the port as told by your health care provider. Keep the manufacturer's information card with you at all times. °· Change your dressing as told by your health care provider. °· Contact a health care provider if you have a fever or chills or if you have redness, swelling, or pain around your port insertion site. °· Keep all follow-up visits as told by your health care provider. °This information is not intended to replace advice given to you by your health care  provider. Make sure you discuss any questions you have with your health care provider. °Document Released: 09/10/2013 Document Revised: 06/18/2018 Document Reviewed: 06/18/2018 °Elsevier Patient Education © 2020 Elsevier Inc. ° ° °

## 2019-09-18 NOTE — Telephone Encounter (Signed)
I contacted patient and he states that he wants to proceed with chemotherapy. He has been scheduled for monday 10/26 and is aware.

## 2019-09-18 NOTE — Telephone Encounter (Signed)
I called and this is the main number for cancer center. Was on hold for 5 minutes. Please ask him to get me a direct number and I am happy to call. Thanks.

## 2019-09-18 NOTE — Op Note (Signed)
      [facility]       Operative Note  Date: 09/18/2019  Preoperative diagnosis:  1. Oropharyngeal cancer  Postoperative diagnosis:  Same as above  Procedures: #1. Ultrasound guidance for vascular access to the right internal jugular vein. #2. Fluoroscopic guidance for placement of catheter. #3. Placement of CT compatible Port-A-Cath, right internal jugular vein.  Surgeon: Leotis Pain, MD.   Anesthesia: Local with moderate conscious sedation for approximately 15  minutes using 3 mg of Versed and 75 mcg of Fentanyl  Fluoroscopy time: less than 1 minute  Contrast used: 0  Estimated blood loss: 3 cc  Indication for the procedure:  The patient is a 63 y.o.male with oropharyngeal cancer.  The patient needs a Port-A-Cath for durable venous access, chemotherapy, lab draws, and CT scans. We are asked to place this. Risks and benefits were discussed and informed consent was obtained.  Description of procedure: The patient was brought to the vascular and interventional radiology suite.  Moderate conscious sedation was administered throughout the procedure during a face to face encounter with the patient with my supervision of the RN administering medicines and monitoring the patient's vital signs, pulse oximetry, telemetry and mental status throughout from the start of the procedure until the patient was taken to the recovery room. The right neck chest and shoulder were sterilely prepped and draped, and a sterile surgical field was created. Ultrasound was used to help visualize a patent right internal jugular vein. This was then accessed under direct ultrasound guidance without difficulty with the Seldinger needle and a permanent image was recorded. A J-wire was placed. After skin nick and dilatation, the peel-away sheath was then placed over the wire. I then anesthetized an area under the clavicle approximately 1-2 fingerbreadths. A transverse incision was created and an  inferior pocket was created with electrocautery and blunt dissection. The port was then brought onto the field, placed into the pocket and secured to the chest wall with 2 Prolene sutures. The catheter was connected to the port and tunneled from the subclavicular incision to the access site. Fluoroscopic guidance was then used to cut the catheter to an appropriate length. The catheter was then placed through the peel-away sheath and the peel-away sheath was removed. The catheter tip was parked in excellent location under fluorocoscopic guidance in the cavoatrial junction. The pocket was then irrigated with antibiotic impregnated saline and the wound was closed with a running 3-0 Vicryl and a 4-0 Monocryl. The access incision was closed with a single 4-0 Monocryl. The Huber needle was used to withdraw blood and flush the port with heparinized saline. Dermabond was then placed as a dressing. The patient tolerated the procedure well and was taken to the recovery room in stable condition.   Leotis Pain 09/18/2019 11:46 AM   This note was created with Dragon Medical transcription system. Any errors in dictation are purely unintentional.

## 2019-09-18 NOTE — H&P (Signed)
[facility] History & Physical Update  The patient was interviewed and re-examined.  The patient's previous History and Physical has been reviewed and is unchanged.  There is no change in the plan of care. We plan to proceed with the scheduled procedure.  Leotis Pain, MD  09/18/2019, 9:09 AM

## 2019-09-18 NOTE — Progress Notes (Signed)
ON PATHWAY REGIMEN - Head and Neck  No Change  Continue With Treatment as Ordered.     A cycle is every 21 days:     Carboplatin      Fluorouracil      Pembrolizumab   **Always confirm dose/schedule in your pharmacy ordering system**  Patient Characteristics: Oropharynx, HPV Positive, Metastatic, First Line, No Prior Platinum-Based Chemoradiation within 6 Months, PD-L1 Expression Negative (CPS < 1) / Unknown Disease Classification: Oropharynx HPV Status: Positive (+) AJCC N Category: NX AJCC 8 Stage Grouping: IV Current Disease Status: Metastatic Disease AJCC T Category: T1 AJCC M Category: M1 Line of Therapy: First Line Prior Platinum Status: No Prior Platinum-Based Chemoradiation within 6 Months PD-L1 Expression Status: Awaiting Test Results Intent of Therapy: Non-Curative / Palliative Intent, Discussed with Patient

## 2019-09-19 NOTE — Telephone Encounter (Signed)
Patient is going to get the direct cell number and let us know.

## 2019-09-22 ENCOUNTER — Other Ambulatory Visit: Payer: Self-pay | Admitting: Oncology

## 2019-09-26 ENCOUNTER — Other Ambulatory Visit: Payer: Self-pay

## 2019-09-26 NOTE — Progress Notes (Signed)
Patient pre screened for office appointment, no questions or concerns today. 

## 2019-09-29 ENCOUNTER — Inpatient Hospital Stay (HOSPITAL_BASED_OUTPATIENT_CLINIC_OR_DEPARTMENT_OTHER): Payer: PRIVATE HEALTH INSURANCE | Admitting: Oncology

## 2019-09-29 ENCOUNTER — Inpatient Hospital Stay: Payer: PRIVATE HEALTH INSURANCE

## 2019-09-29 ENCOUNTER — Other Ambulatory Visit: Payer: Self-pay

## 2019-09-29 ENCOUNTER — Encounter: Payer: Self-pay | Admitting: Oncology

## 2019-09-29 VITALS — BP 125/84 | HR 94 | Temp 96.3°F | Resp 18 | Wt 176.5 lb

## 2019-09-29 DIAGNOSIS — R0602 Shortness of breath: Secondary | ICD-10-CM

## 2019-09-29 DIAGNOSIS — C109 Malignant neoplasm of oropharynx, unspecified: Secondary | ICD-10-CM

## 2019-09-29 DIAGNOSIS — Z5111 Encounter for antineoplastic chemotherapy: Secondary | ICD-10-CM | POA: Diagnosis not present

## 2019-09-29 DIAGNOSIS — R1310 Dysphagia, unspecified: Secondary | ICD-10-CM

## 2019-09-29 DIAGNOSIS — G893 Neoplasm related pain (acute) (chronic): Secondary | ICD-10-CM | POA: Diagnosis not present

## 2019-09-29 DIAGNOSIS — Z5112 Encounter for antineoplastic immunotherapy: Secondary | ICD-10-CM | POA: Diagnosis not present

## 2019-09-29 LAB — COMPREHENSIVE METABOLIC PANEL
ALT: 18 U/L (ref 0–44)
AST: 19 U/L (ref 15–41)
Albumin: 3.7 g/dL (ref 3.5–5.0)
Alkaline Phosphatase: 82 U/L (ref 38–126)
Anion gap: 6 (ref 5–15)
BUN: 24 mg/dL — ABNORMAL HIGH (ref 8–23)
CO2: 25 mmol/L (ref 22–32)
Calcium: 9.2 mg/dL (ref 8.9–10.3)
Chloride: 109 mmol/L (ref 98–111)
Creatinine, Ser: 1.12 mg/dL (ref 0.61–1.24)
GFR calc Af Amer: >60 mL/min (ref >60)
GFR calc non Af Amer: >60 mL/min (ref >60)
Glucose, Bld: 88 mg/dL (ref 70–99)
Potassium: 4.2 mmol/L (ref 3.5–5.1)
Sodium: 140 mmol/L (ref 135–145)
Total Bilirubin: 0.8 mg/dL (ref 0.3–1.2)
Total Protein: 6.8 g/dL (ref 6.5–8.1)

## 2019-09-29 LAB — CBC WITH DIFFERENTIAL/PLATELET
Abs Immature Granulocytes: 0.03 10*3/uL (ref 0.00–0.07)
Basophils Absolute: 0.1 10*3/uL (ref 0.0–0.1)
Basophils Relative: 1 %
Eosinophils Absolute: 0.2 10*3/uL (ref 0.0–0.5)
Eosinophils Relative: 2 %
HCT: 41.7 % (ref 39.0–52.0)
Hemoglobin: 13.8 g/dL (ref 13.0–17.0)
Immature Granulocytes: 0 %
Lymphocytes Relative: 9 %
Lymphs Abs: 0.7 10*3/uL (ref 0.7–4.0)
MCH: 29.8 pg (ref 26.0–34.0)
MCHC: 33.1 g/dL (ref 30.0–36.0)
MCV: 90.1 fL (ref 80.0–100.0)
Monocytes Absolute: 0.5 10*3/uL (ref 0.1–1.0)
Monocytes Relative: 6 %
Neutro Abs: 6.5 10*3/uL (ref 1.7–7.7)
Neutrophils Relative %: 82 %
Platelets: 236 10*3/uL (ref 150–400)
RBC: 4.63 MIL/uL (ref 4.22–5.81)
RDW: 13.2 % (ref 11.5–15.5)
WBC: 8 10*3/uL (ref 4.0–10.5)
nRBC: 0 % (ref 0.0–0.2)

## 2019-09-29 LAB — TSH: TSH: 2.888 u[IU]/mL (ref 0.350–4.500)

## 2019-09-29 MED ORDER — SODIUM CHLORIDE 0.9 % IV SOLN
1000.0000 mg/m2/d | INTRAVENOUS | Status: DC
  Administered 2019-09-29: 14:00:00 7900 mg via INTRAVENOUS
  Filled 2019-09-29: qty 100

## 2019-09-29 MED ORDER — SODIUM CHLORIDE 0.9 % IV SOLN
Freq: Once | INTRAVENOUS | Status: AC
  Administered 2019-09-29: 11:00:00 via INTRAVENOUS
  Filled 2019-09-29: qty 5

## 2019-09-29 MED ORDER — PALONOSETRON HCL INJECTION 0.25 MG/5ML
0.2500 mg | Freq: Once | INTRAVENOUS | Status: AC
  Administered 2019-09-29: 10:00:00 0.25 mg via INTRAVENOUS
  Filled 2019-09-29: qty 5

## 2019-09-29 MED ORDER — SODIUM CHLORIDE 0.9 % IV SOLN
Freq: Once | INTRAVENOUS | Status: AC
  Administered 2019-09-29: 10:00:00 via INTRAVENOUS
  Filled 2019-09-29: qty 250

## 2019-09-29 MED ORDER — SODIUM CHLORIDE 0.9 % IV SOLN
200.0000 mg | Freq: Once | INTRAVENOUS | Status: AC
  Administered 2019-09-29: 11:00:00 200 mg via INTRAVENOUS
  Filled 2019-09-29: qty 8

## 2019-09-29 MED ORDER — HEPARIN SOD (PORK) LOCK FLUSH 100 UNIT/ML IV SOLN: 500.0000 [IU] | Freq: Once | INTRAVENOUS | Status: DC

## 2019-09-29 MED ORDER — SODIUM CHLORIDE 0.9 % IV SOLN
499.0000 mg | Freq: Once | INTRAVENOUS | Status: AC
  Administered 2019-09-29: 12:00:00 500 mg via INTRAVENOUS
  Filled 2019-09-29: qty 50

## 2019-09-29 MED ORDER — SODIUM CHLORIDE 0.9% FLUSH
10.0000 mL | Freq: Once | INTRAVENOUS | Status: AC
  Administered 2019-09-29: 09:00:00 10 mL via INTRAVENOUS
  Filled 2019-09-29: qty 10

## 2019-09-29 NOTE — Progress Notes (Signed)
Patient reports that he feels like he is having trouble breathing due to his cancer.  Having throat/chest area pain 7/10 today.

## 2019-09-29 NOTE — Progress Notes (Signed)
Hematology/Oncology  Follow Up note [facility] [phone] [phone]   Patient Care Team: Albina Billet, MD as PCP - General (Internal Medicine) Albina Billet, MD (Internal Medicine) Bary Castilla, Forest Gleason, MD (General Surgery) Noreene Filbert, MD as Referring Physician (Radiation Oncology) Earlie Server, MD as Consulting Physician (Oncology)  REFERRING PROVIDER: Dr.McQueen CHIEF COMPLAINTS/PURPOSE OF CONSULTATION:  Evaluation of newly diagnosed head and neck cancer.  HISTORY OF PRESENTING ILLNESS:  Ivan Mindel. is a  63 y.o.  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 P16 positive. cT2 cN2 disease, stage II, Concurrent ChemoRT  Cisplatin 100 mg/m2 q3weeks with concurrent RT which started on 12/31/2017.  S/p one dose of Cisplatin. Cisplatin discontinued due to nephrotoxicity. Switch to weekly Carboplatin (AUC 2) / Taxol 45mg /m2 [ finished May 2019] Patient has finished concurrent chemoradiation in May 2019  # He reports doing well until in July 2020, he started to have hoarseness after singing for 2 to 3 hours. He has changed his insurance and Dr. Tami Ribas is no longer covered by his current insurance.  So he establish care with Dr.Brenden Rudman Juliann Pulse at [facility] and was seen on 08/04/2019. Flexible laryngoscopy showed Vocal cord paralysis.  Patient was recommended to have a PET scan done for restaging. Patient's primary care provider Dr. Hall Busing ordered a PET scan for patient which was done on 08/20/2019.  PET scan unfortunately showed metastatic disease.  Patient called cancer center and is scheduled follow-up with me. 08/20/2019 PET scan showed asymmetric hypermetabolic him involving the left vocal cords with hypermetabolic adenopathy in the neck, and the chest.  He also has hypermetabolic pulmonary nodule along the minor  fissure which is felt to be metastatic. Tiny proximal left ureteral stone without hydronephrosis.  Possible gallbladder sludge.  Enlarged prostate.  Atherosclerosis.  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   INTERVAL HISTORY Ivan Garcia. is a 63 y.o. male who presents for follow-up metastatic squamous cell carcinoma, P 16+.  Evaluation prior to systemic chemotherapy treatments. #Patient reports having throat pain.  No difficulty swallowing.  Continue to have hoarseness. Also reports feeling shortness of breath with exertion Denies any cough, chest pain, abdominal pain, leg swelling. Patient has had Mediport placed during interval.  Review of Systems  Constitutional: Negative for chills, fever, malaise/fatigue and weight loss.  HENT: Negative for sore throat.        Sore throat, hoarseness  neck pain  Eyes: Negative for redness.  Respiratory: Positive for shortness of breath. Negative for cough and wheezing.   Cardiovascular: Negative for chest pain, palpitations and leg swelling.  Gastrointestinal: Negative for abdominal pain, blood in stool, nausea and vomiting.  Genitourinary: Negative for dysuria.  Musculoskeletal: Negative for myalgias.  Skin: Negative for rash.  Neurological: Negative for dizziness, tingling and tremors.  Endo/Heme/Allergies: Does not bruise/bleed easily.  Psychiatric/Behavioral: Negative for hallucinations.    MEDICAL HISTORY:  Past Medical History:  Diagnosis Date   Arthritis    Cancer ([place])    Head and neck cancer   Hemorrhoids    Hyperlipidemia    Hypertension     SURGICAL HISTORY: Past Surgical History:  Procedure Laterality Date   COLONOSCOPY WITH PROPOFOL N/A 04/04/2017   Procedure: COLONOSCOPY WITH PROPOFOL;  Surgeon: Robert Bellow, MD;  Location: [facility] ENDOSCOPY;  Service: Endoscopy;  Laterality: N/A;   MANDIBLE SURGERY     PORT-A-CATH  REMOVAL  07/17/2018   PORTA CATH INSERTION N/A 12/19/2017   Procedure: PORTA CATH INSERTION;  Surgeon: Algernon Huxley, MD;  Location: [place] [facility];  Service: Cardiovascular;  Laterality: N/A;   PORTA CATH INSERTION N/A 07/17/2018   Procedure: PORTA CATH INSERTION;  Surgeon: Algernon Huxley, MD;  Location: [place] [facility];  Service: Cardiovascular;  Laterality: N/A;   PORTA CATH INSERTION N/A 09/18/2019   Procedure: PORTA CATH INSERTION;  Surgeon: Algernon Huxley, MD;  Location: [place] [facility];  Service: Cardiovascular;  Laterality: N/A;   TONSILLECTOMY      SOCIAL HISTORY: Social History   Socioeconomic History   Marital status: Single    Spouse name: Not on file   Number of children: Not on file   Years of education: Not on file   Highest education level: Not on file  Occupational History   Not on file  Social Needs   Financial resource strain: Not on file   Food insecurity    Worry: Not on file    Inability: Not on file   Transportation needs    Medical: Not on file    Non-medical: Not on file  Tobacco Use   Smoking status: Never Smoker   Smokeless tobacco: Never Used  Substance and Sexual Activity   Alcohol use: No   Drug use: No   Sexual activity: Not on file  Lifestyle   Physical activity    Days per week: Not on file    Minutes per session: Not on file   Stress: Not on file  Relationships   Social connections    Talks on phone: Not on file    Gets together: Not on file    Attends religious service: Not on file    Active member of club or organization: Not on file    Attends meetings of clubs or organizations: Not on file    Relationship status: Not on file   Intimate partner violence    Fear of current or ex partner: Not on file    Emotionally abused: Not on file    Physically abused: Not on file    Forced sexual activity: Not on file  Other Topics Concern   Not on file  Social History Narrative   Not on file    FAMILY  HISTORY: Family History  Problem Relation Age of Onset   Breast cancer Mother    Asthma Mother    Congestive Heart Failure Mother    Prostate cancer Father    Brain cancer Father    Bladder Cancer Father     ALLERGIES:  has No Known Allergies.  MEDICATIONS:  Current Outpatient Medications  Medication Sig Dispense Refill   amLODipine (NORVASC) 2.5 MG tablet Take 1 tablet (2.5 mg total) by mouth daily. (Patient taking differently: Take 2.5 mg by mouth daily. Patient now taking 5 mg daily (2 tabs)) 30 tablet 0   dexamethasone (DECADRON) 4 MG tablet Take 2 tablets (8 mg total) by mouth daily. Start the day after carboplatin chemotherapy for 3 days. 30 tablet 1   lidocaine-prilocaine (EMLA) cream Apply to affected area once 30 g 3   Multiple Vitamins-Minerals (MULTIVITAMIN WITH MINERALS) tablet Take 1 tablet by mouth daily.     ondansetron (ZOFRAN) 8 MG tablet Take 1 tablet (8 mg total) by mouth 2 (two) times daily as needed for refractory nausea / vomiting. Start on day 3  after carboplatin chemo. 30 tablet 1   oxyCODONE (ROXICODONE) 5 MG immediate release tablet Take 1 tablet (5 mg total) by mouth every 12 (twelve) hours as needed for moderate pain or severe pain. 30 tablet 0   prochlorperazine (COMPAZINE) 10 MG tablet Take 1 tablet (10 mg total) by mouth every 6 (six) hours as needed (Nausea or vomiting). 30 tablet 1   rosuvastatin (CRESTOR) 10 MG tablet Take 10 mg by mouth every evening.  4   No current facility-administered medications for this visit.      PHYSICAL EXAMINATION: ECOG PERFORMANCE STATUS: 1 - Symptomatic but completely ambulatory Vitals:   09/29/19 0900  BP: 125/84  Pulse: 94  Resp: 18  Temp: (!) 96.3 F (35.7 C)   Physical Exam  Constitutional: He is oriented to person, place, and time and well-developed, well-nourished, and in no distress. No distress.  HENT:  Head: Normocephalic and atraumatic.  Nose: Nose normal.  Mouth/Throat: Oropharynx is  clear and moist. No oropharyngeal exudate.  No thrush Dry oral mucosa  Eyes: Pupils are equal, round, and reactive to light. Conjunctivae and EOM are normal. Left eye exhibits no discharge. No scleral icterus.  Neck: Normal range of motion. Neck supple. No thyromegaly present.  No palpable neck mass.  Cardiovascular: Normal rate, regular rhythm and normal heart sounds. Exam reveals no gallop and no friction rub.  No murmur heard. Pulmonary/Chest: Effort normal and breath sounds normal. No respiratory distress. He has no wheezes. He has no rales. He exhibits no tenderness.  Abdominal: Soft. Bowel sounds are normal. He exhibits no distension. There is no abdominal tenderness. There is no rebound.  Musculoskeletal: Normal range of motion.        General: No tenderness, deformity or edema.  Lymphadenopathy:    He has no cervical adenopathy.  Neurological: He is alert and oriented to person, place, and time. No cranial nerve deficit. He exhibits normal muscle tone. Coordination normal.  Skin: Skin is warm and dry. He is not diaphoretic. No erythema.  Psychiatric: Affect normal.     LABORATORY DATA:  I have reviewed the data as listed Lab Results  Component Value Date   WBC 8.0 09/29/2019   HGB 13.8 09/29/2019   HCT 41.7 09/29/2019   MCV 90.1 09/29/2019   PLT 236 09/29/2019   Recent Labs    09/29/19 0846  NA 140  K 4.2  CL 109  CO2 25  GLUCOSE 88  BUN 24*  CREATININE 1.12  CALCIUM 9.2  GFRNONAA >60  GFRAA >60  PROT 6.8  ALBUMIN 3.7  AST 19  ALT 18  ALKPHOS 82  BILITOT 0.8  RADIOGRAPHIC STUDIES: I have personally reviewed the radiological images as listed and agreed with the findings in the report. Nm Pet Image Initial (pi) Skull Base To Thigh  Result Date: 08/20/2019 CLINICAL DATA:  Subsequent treatment strategy for throat cancer. EXAM: NUCLEAR MEDICINE PET SKULL BASE TO THIGH TECHNIQUE: 9.8 mCi F-18 FDG was injected intravenously. Full-ring PET imaging was performed  from the skull base to thigh after the radiotracer. CT data was obtained and used for attenuation correction and anatomic localization. Fasting blood glucose: 90 mg/dl COMPARISON:  07/02/2018 and CT neck 11/15/2017. FINDINGS: Mediastinal blood pool activity: SUV max 2.7 Liver activity: SUV max NA NECK: Right level-II lymph node measures 8 mm (3/63) with an SUV max of 5.8. There is asymmetric hypermetabolism within the left vocal cords, SUV max 13.8. No additional abnormal hypermetabolism in the neck. Incidental CT findings: None.  CHEST: There are 2 hypermetabolic pathologically enlarged lymph nodes in the superior mediastinum. Index high right paratracheal lymph node measures 2.5 cm with an SUV max of 16.0. A soft tissue nodule or lymph node medial to the proximal right clavicle measures 10 mm in short axis (3/79) with an SUV max of 5.4. No additional hypermetabolic hilar or axillary lymph nodes. A pulmonary nodule along the anterior margin of minor fissure measures 1.4 cm (3/113) with an SUV max of 9.1. Incidental CT findings: Atherosclerotic calcification of the aorta, aortic valve and coronary arteries. No pericardial or pleural effusion. ABDOMEN/PELVIS: No abnormal hypermetabolism in the liver, adrenal glands, spleen or pancreas. No hypermetabolic lymph nodes. Incidental CT findings: Low-attenuation lesion in the left hepatic lobe measures 1.5 cm, likely a cyst. There may be sludge in the gallbladder. Adrenal glands are unremarkable. Low-attenuation lesion in the upper pole right kidney measures 4.9 cm and is likely a cyst. A 1 mm stone is seen in the proximal left ureter (3/183), without hydronephrosis. There may be renal sinus cysts on the left. Spleen, pancreas, stomach and bowel are unremarkable. Bladder is low in volume. Prostate is mildly enlarged. Atherosclerotic calcification of the aorta. SKELETON: No abnormal osseous hypermetabolism. Incidental CT findings: None. IMPRESSION: 1. Asymmetric  hypermetabolism involving the left vocal cords with hypermetabolic metastatic adenopathy in the neck and chest. Hypermetabolic pulmonary nodule along the minor fissure is also felt to be metastatic. 2. Tiny proximal left ureteral stone without hydronephrosis. 3. Possible gallbladder sludge. 4. Enlarged prostate. 5. Aortic atherosclerosis (ICD10-170.0). Coronary artery calcification. Electronically Signed   By: Lorin Picket M.D.   On: 08/20/2019 14:32   [place] Fna Soft Tissue  Result Date: 09/08/2019 INDICATION: History of squamous cell carcinoma of the throat with PET avid right supraclavicular lymph node. EXAM: ULTRASOUND-GUIDED FNA AND CORE BIOPSY OF A RIGHT SUPRACLAVICULAR LYMPH NODE MEDICATIONS: None. ANESTHESIA/SEDATION: None FLUOROSCOPY TIME:  Not applicable COMPLICATIONS: None immediate. PROCEDURE: Informed written consent was obtained from the patient after a thorough discussion of the procedural risks, benefits and alternatives. All questions were addressed. Maximal Sterile Barrier Technique was utilized including caps, mask, sterile gowns, sterile gloves, sterile drape, hand hygiene and skin antiseptic. A timeout was performed prior to the initiation of the procedure. The patient was then prepped and draped in the sterile manner with chlorhexidine. Utilizing 1% xylocaine as local anesthetic and real-time ultrasound guidance a fine-needle aspiration was performed utilizing a 25 gauge needle. This sample was given to the cytotechnologist and deemed adequate. Subsequently multiple core biopsies were obtained of the right supraclavicular lymph node as well as a hypoechoic area surrounding the lymph nodes suspicious for localized invasion. These cores were given to the cytotechnologist for pathologic evaluation. Patient tolerated the procedure well. Needle was removed and hemostasis obtained at the puncture site. Patient was then returned his room in satisfactory condition. IMPRESSION: Successful fine-needle  aspiration and core biopsy of a right supraclavicular lymph node. Electronically Signed   By: Inez Catalina M.D.   On: 09/08/2019 12:09     ASSESSMENT & PLAN:   1. 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[place])   2. Odynophagia   3. Encounter for antineoplastic chemotherapy   4. Neoplasm related pain   5. 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 Labs reviewed and discussed with patient.   Counts acceptable to proceed with cycle 1 Keytruda/carboplatin/5-FU day 1-4 treatments. NGS has been sent and is pending.  #Odynophagia,  neoplasm related pain. Patient uses oxycodone 5 mg, 1 to 2 tablets a day as needed.  Continue. # SOB on exertion, etiology unknown. ? Anxiety., Close monitor.  All questions were answered. The patient knows to call the clinic with any problems questions or concerns.  Return of visit: 1 week    Earlie Server, MD, PhD Hematology Oncology [facility] at [facility] Pager- [phone] 09/29/2019

## 2019-10-03 ENCOUNTER — Encounter: Payer: Self-pay | Admitting: Oncology

## 2019-10-03 ENCOUNTER — Other Ambulatory Visit: Payer: Self-pay

## 2019-10-03 ENCOUNTER — Inpatient Hospital Stay: Payer: PRIVATE HEALTH INSURANCE

## 2019-10-03 DIAGNOSIS — Z5112 Encounter for antineoplastic immunotherapy: Secondary | ICD-10-CM | POA: Diagnosis not present

## 2019-10-03 DIAGNOSIS — C109 Malignant neoplasm of oropharynx, unspecified: Secondary | ICD-10-CM

## 2019-10-03 MED ORDER — HEPARIN SOD (PORK) LOCK FLUSH 100 UNIT/ML IV SOLN
500.0000 [IU] | Freq: Once | INTRAVENOUS | Status: AC | PRN
  Administered 2019-10-03: 500 [IU]
  Filled 2019-10-03: qty 5

## 2019-10-03 MED ORDER — SODIUM CHLORIDE 0.9% FLUSH
10.0000 mL | INTRAVENOUS | Status: DC | PRN
  Administered 2019-10-03: 10 mL
  Filled 2019-10-03: qty 10

## 2019-10-03 NOTE — Progress Notes (Signed)
Patient pre screened for office appointment, no questions or concerns today. 

## 2019-10-06 ENCOUNTER — Encounter: Payer: Self-pay | Admitting: Oncology

## 2019-10-06 ENCOUNTER — Inpatient Hospital Stay: Payer: PRIVATE HEALTH INSURANCE

## 2019-10-06 ENCOUNTER — Inpatient Hospital Stay: Payer: PRIVATE HEALTH INSURANCE | Attending: Oncology

## 2019-10-06 ENCOUNTER — Other Ambulatory Visit: Payer: Self-pay

## 2019-10-06 ENCOUNTER — Inpatient Hospital Stay (HOSPITAL_BASED_OUTPATIENT_CLINIC_OR_DEPARTMENT_OTHER): Payer: PRIVATE HEALTH INSURANCE | Admitting: Oncology

## 2019-10-06 VITALS — BP 126/87 | HR 103 | Temp 98.4°F | Resp 18 | Wt 172.4 lb

## 2019-10-06 DIAGNOSIS — Z8042 Family history of malignant neoplasm of prostate: Secondary | ICD-10-CM | POA: Insufficient documentation

## 2019-10-06 DIAGNOSIS — K1379 Other lesions of oral mucosa: Secondary | ICD-10-CM | POA: Diagnosis not present

## 2019-10-06 DIAGNOSIS — I129 Hypertensive chronic kidney disease with stage 1 through stage 4 chronic kidney disease, or unspecified chronic kidney disease: Secondary | ICD-10-CM | POA: Diagnosis not present

## 2019-10-06 DIAGNOSIS — C109 Malignant neoplasm of oropharynx, unspecified: Secondary | ICD-10-CM | POA: Diagnosis present

## 2019-10-06 DIAGNOSIS — Z803 Family history of malignant neoplasm of breast: Secondary | ICD-10-CM | POA: Diagnosis not present

## 2019-10-06 DIAGNOSIS — Z5111 Encounter for antineoplastic chemotherapy: Secondary | ICD-10-CM | POA: Diagnosis present

## 2019-10-06 DIAGNOSIS — Z808 Family history of malignant neoplasm of other organs or systems: Secondary | ICD-10-CM | POA: Diagnosis not present

## 2019-10-06 DIAGNOSIS — D72810 Lymphocytopenia: Secondary | ICD-10-CM | POA: Insufficient documentation

## 2019-10-06 DIAGNOSIS — C770 Secondary and unspecified malignant neoplasm of lymph nodes of head, face and neck: Secondary | ICD-10-CM | POA: Insufficient documentation

## 2019-10-06 DIAGNOSIS — C7800 Secondary malignant neoplasm of unspecified lung: Secondary | ICD-10-CM | POA: Insufficient documentation

## 2019-10-06 DIAGNOSIS — N1831 Chronic kidney disease, stage 3a: Secondary | ICD-10-CM | POA: Diagnosis not present

## 2019-10-06 DIAGNOSIS — Z79899 Other long term (current) drug therapy: Secondary | ICD-10-CM | POA: Insufficient documentation

## 2019-10-06 DIAGNOSIS — E785 Hyperlipidemia, unspecified: Secondary | ICD-10-CM | POA: Insufficient documentation

## 2019-10-06 DIAGNOSIS — G893 Neoplasm related pain (acute) (chronic): Secondary | ICD-10-CM | POA: Insufficient documentation

## 2019-10-06 DIAGNOSIS — R1310 Dysphagia, unspecified: Secondary | ICD-10-CM | POA: Insufficient documentation

## 2019-10-06 DIAGNOSIS — Z8052 Family history of malignant neoplasm of bladder: Secondary | ICD-10-CM | POA: Diagnosis not present

## 2019-10-06 LAB — CBC WITH DIFFERENTIAL/PLATELET
Abs Immature Granulocytes: 0.04 10*3/uL (ref 0.00–0.07)
Basophils Absolute: 0 10*3/uL (ref 0.0–0.1)
Basophils Relative: 0 %
Eosinophils Absolute: 0.2 10*3/uL (ref 0.0–0.5)
Eosinophils Relative: 3 %
HCT: 45.8 % (ref 39.0–52.0)
Hemoglobin: 15.1 g/dL (ref 13.0–17.0)
Immature Granulocytes: 1 %
Lymphocytes Relative: 6 %
Lymphs Abs: 0.5 10*3/uL — ABNORMAL LOW (ref 0.7–4.0)
MCH: 29.7 pg (ref 26.0–34.0)
MCHC: 33 g/dL (ref 30.0–36.0)
MCV: 90.2 fL (ref 80.0–100.0)
Monocytes Absolute: 0.1 10*3/uL (ref 0.1–1.0)
Monocytes Relative: 2 %
Neutro Abs: 7.1 10*3/uL (ref 1.7–7.7)
Neutrophils Relative %: 88 %
Platelets: 225 10*3/uL (ref 150–400)
RBC: 5.08 MIL/uL (ref 4.22–5.81)
RDW: 12.6 % (ref 11.5–15.5)
WBC: 8 10*3/uL (ref 4.0–10.5)
nRBC: 0 % (ref 0.0–0.2)

## 2019-10-06 LAB — COMPREHENSIVE METABOLIC PANEL
ALT: 17 U/L (ref 0–44)
AST: 15 U/L (ref 15–41)
Albumin: 3.7 g/dL (ref 3.5–5.0)
Alkaline Phosphatase: 77 U/L (ref 38–126)
Anion gap: 9 (ref 5–15)
BUN: 30 mg/dL — ABNORMAL HIGH (ref 8–23)
CO2: 25 mmol/L (ref 22–32)
Calcium: 8.9 mg/dL (ref 8.9–10.3)
Chloride: 100 mmol/L (ref 98–111)
Creatinine, Ser: 1.49 mg/dL — ABNORMAL HIGH (ref 0.61–1.24)
GFR calc Af Amer: 57 mL/min — ABNORMAL LOW (ref >60)
GFR calc non Af Amer: 49 mL/min — ABNORMAL LOW (ref >60)
Glucose, Bld: 139 mg/dL — ABNORMAL HIGH (ref 70–99)
Potassium: 4.4 mmol/L (ref 3.5–5.1)
Sodium: 134 mmol/L — ABNORMAL LOW (ref 135–145)
Total Bilirubin: 1 mg/dL (ref 0.3–1.2)
Total Protein: 6.9 g/dL (ref 6.5–8.1)

## 2019-10-06 LAB — TSH: TSH: 5.305 u[IU]/mL — ABNORMAL HIGH (ref 0.350–4.500)

## 2019-10-06 NOTE — Progress Notes (Signed)
Hematology/Oncology  Follow Up note [facility] [phone] [phone]   Patient Care Team: Albina Billet, MD as PCP - General (Internal Medicine) Albina Billet, MD (Internal Medicine) Bary Castilla, Forest Gleason, MD (General Surgery) Noreene Filbert, MD as Referring Physician (Radiation Oncology) Earlie Server, MD as Consulting Physician (Oncology)  REFERRING PROVIDER: Dr.McQueen CHIEF COMPLAINTS/PURPOSE OF CONSULTATION:  Evaluation of newly diagnosed head and neck cancer.  HISTORY OF PRESENTING ILLNESS:  Ivan Garcia. is a  63 y.o.  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 P16 positive. cT2 cN2 disease, stage II, Concurrent ChemoRT  Cisplatin 100 mg/m2 q3weeks with concurrent RT which started on 12/31/2017.  S/p one dose of Cisplatin. Cisplatin discontinued due to nephrotoxicity. Switch to weekly Carboplatin (AUC 2) / Taxol 45mg /m2 [ finished May 2019] Patient has finished concurrent chemoradiation in May 2019  # He reports doing well until in July 2020, he started to have hoarseness after singing for 2 to 3 hours. He has changed his insurance and Dr. Tami Ribas is no longer covered by his current insurance.  So he establish care with Dr.Briscoe Daniello Juliann Pulse at [place] and was seen on 08/04/2019. Flexible laryngoscopy showed Vocal cord paralysis.  Patient was recommended to have a PET scan done for restaging. Patient's primary care provider Dr. Hall Busing ordered a PET scan for patient which was done on 08/20/2019.  PET scan unfortunately showed metastatic disease.  Patient called cancer center and is scheduled follow-up with me. 08/20/2019 PET scan showed asymmetric hypermetabolic him involving the left vocal cords with hypermetabolic adenopathy in the neck, and the chest.  He also has hypermetabolic pulmonary nodule along the minor  fissure which is felt to be metastatic. Tiny proximal left ureteral stone without hydronephrosis.  Possible gallbladder sludge.  Enlarged prostate.  Atherosclerosis.  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   INTERVAL HISTORY Ivan Garcia. is a 63 y.o. male who presents for follow-up metastatic squamous cell carcinoma, P 16+.   #Patient reports having mouth sore for a few days.  He has got mouth rinse that was prescribed by his dentist.  He plans to use it. Denies any nausea, vomiting, diarrhea, chest pain, abdominal pain or leg swelling. Chronic fatigue at baseline. No fever or chills Review of Systems  Constitutional: Negative for chills, fever, malaise/fatigue and weight loss.  HENT: Negative for sore throat.        Sore throat, hoarseness  neck pain  Eyes: Negative for redness.  Respiratory: Negative for cough, shortness of breath and wheezing.   Cardiovascular: Negative for chest pain, palpitations and leg swelling.  Gastrointestinal: Negative for abdominal pain, blood in stool, nausea and vomiting.  Genitourinary: Negative for dysuria.  Musculoskeletal: Negative for myalgias.  Skin: Negative for rash.  Neurological: Negative for dizziness, tingling and tremors.  Endo/Heme/Allergies: Does not bruise/bleed easily.  Psychiatric/Behavioral: Negative for hallucinations.    MEDICAL HISTORY:  Past Medical History:  Diagnosis Date   Arthritis    Cancer ([place])    Head and neck cancer   Hemorrhoids    Hyperlipidemia    Hypertension     SURGICAL HISTORY: Past Surgical History:  Procedure Laterality Date   COLONOSCOPY WITH PROPOFOL N/A 04/04/2017   Procedure: COLONOSCOPY WITH PROPOFOL;  Surgeon: Robert Bellow, MD;  Location: [facility] ENDOSCOPY;  Service: Endoscopy;  Laterality: N/A;   MANDIBLE SURGERY     PORT-A-CATH REMOVAL  07/17/2018   PORTA CATH INSERTION N/A 12/19/2017    Procedure: PORTA CATH INSERTION;  Surgeon: Algernon Huxley, MD;  Location: [place] [facility];  Service: Cardiovascular;  Laterality: N/A;   PORTA CATH INSERTION N/A 07/17/2018   Procedure: PORTA CATH INSERTION;  Surgeon: Algernon Huxley, MD;  Location: [place] [facility];  Service: Cardiovascular;  Laterality: N/A;   PORTA CATH INSERTION N/A 09/18/2019   Procedure: PORTA CATH INSERTION;  Surgeon: Algernon Huxley, MD;  Location: [place] [facility];  Service: Cardiovascular;  Laterality: N/A;   TONSILLECTOMY      SOCIAL HISTORY: Social History   Socioeconomic History   Marital status: Single    Spouse name: Not on file   Number of children: Not on file   Years of education: Not on file   Highest education level: Not on file  Occupational History   Not on file  Social Needs   Financial resource strain: Not on file   Food insecurity    Worry: Not on file    Inability: Not on file   Transportation needs    Medical: Not on file    Non-medical: Not on file  Tobacco Use   Smoking status: Never Smoker   Smokeless tobacco: Never Used  Substance and Sexual Activity   Alcohol use: No   Drug use: No   Sexual activity: Not on file  Lifestyle   Physical activity    Days per week: Not on file    Minutes per session: Not on file   Stress: Not on file  Relationships   Social connections    Talks on phone: Not on file    Gets together: Not on file    Attends religious service: Not on file    Active member of club or organization: Not on file    Attends meetings of clubs or organizations: Not on file    Relationship status: Not on file   Intimate partner violence    Fear of current or ex partner: Not on file    Emotionally abused: Not on file    Physically abused: Not on file    Forced sexual activity: Not on file  Other Topics Concern   Not on file  Social History Narrative   Not on file    FAMILY HISTORY: Family History  Problem Relation Age of Onset    Breast cancer Mother    Asthma Mother    Congestive Heart Failure Mother    Prostate cancer Father    Brain cancer Father    Bladder Cancer Father     ALLERGIES:  has No Known Allergies.  MEDICATIONS:  Current Outpatient Medications  Medication Sig Dispense Refill   amLODipine (NORVASC) 5 MG tablet Take 5 mg by mouth daily.     dexamethasone (DECADRON) 4 MG tablet Take 2 tablets (8 mg total) by mouth daily. Start the day after carboplatin chemotherapy for 3 days. 30 tablet 1   lidocaine-prilocaine (EMLA) cream Apply to affected area once 30 g 3   Multiple Vitamins-Minerals (MULTIVITAMIN WITH MINERALS) tablet Take 1 tablet by mouth daily.     ondansetron (ZOFRAN) 8 MG tablet Take 1 tablet (8 mg total) by mouth 2 (two) times daily as needed for refractory nausea / vomiting. Start on day 3 after carboplatin chemo. 30 tablet 1   oxyCODONE (ROXICODONE) 5 MG immediate release tablet Take 1 tablet (5 mg total)  by mouth every 12 (twelve) hours as needed for moderate pain or severe pain. 30 tablet 0   prochlorperazine (COMPAZINE) 10 MG tablet Take 1 tablet (10 mg total) by mouth every 6 (six) hours as needed (Nausea or vomiting). 30 tablet 1   rosuvastatin (CRESTOR) 10 MG tablet Take 10 mg by mouth every evening.  4   No current facility-administered medications for this visit.      PHYSICAL EXAMINATION: ECOG PERFORMANCE STATUS: 1 - Symptomatic but completely ambulatory Vitals:   10/06/19 0919  BP: 126/87  Pulse: (!) 103  Resp: 18  Temp: 98.4 F (36.9 C)   Physical Exam  Constitutional: He is oriented to person, place, and time and well-developed, well-nourished, and in no distress. No distress.  HENT:  Head: Normocephalic and atraumatic.  Nose: Nose normal.  Mouth/Throat: Oropharynx is clear and moist. No oropharyngeal exudate.  No thrush Dry oral mucosa  Eyes: Pupils are equal, round, and reactive to light. Conjunctivae and EOM are normal. Left eye exhibits no  discharge. No scleral icterus.  Neck: Normal range of motion. Neck supple. No thyromegaly present.  Cardiovascular: Normal rate, regular rhythm and normal heart sounds. Exam reveals no gallop and no friction rub.  No murmur heard. Pulmonary/Chest: Effort normal and breath sounds normal. No respiratory distress. He has no wheezes. He has no rales. He exhibits no tenderness.  Abdominal: Soft. Bowel sounds are normal. He exhibits no distension. There is no abdominal tenderness. There is no rebound.  Musculoskeletal: Normal range of motion.        General: No tenderness, deformity or edema.  Lymphadenopathy:    He has no cervical adenopathy.  Neurological: He is alert and oriented to person, place, and time. No cranial nerve deficit. He exhibits normal muscle tone. Coordination normal.  Skin: Skin is warm and dry. He is not diaphoretic. No erythema.  Psychiatric: Affect normal.     LABORATORY DATA:  I have reviewed the data as listed Lab Results  Component Value Date   WBC 8.0 10/06/2019   HGB 15.1 10/06/2019   HCT 45.8 10/06/2019   MCV 90.2 10/06/2019   PLT 225 10/06/2019   Recent Labs    09/29/19 0846 10/06/19 0907  NA 140 134*  K 4.2 4.4  CL 109 100  CO2 25 25  GLUCOSE 88 139*  BUN 24* 30*  CREATININE 1.12 1.49*  CALCIUM 9.2 8.9  GFRNONAA >60 49*  GFRAA >60 57*  PROT 6.8 6.9  ALBUMIN 3.7 3.7  AST 19 15  ALT 18 17  ALKPHOS 82 77  BILITOT 0.8 1.0  RADIOGRAPHIC STUDIES: I have personally reviewed the radiological images as listed and agreed with the findings in the report. Nm Pet Image Initial (pi) Skull Base To Thigh  Result Date: 08/20/2019 CLINICAL DATA:  Subsequent treatment strategy for throat cancer. EXAM: NUCLEAR MEDICINE PET SKULL BASE TO THIGH TECHNIQUE: 9.8 mCi F-18 FDG was injected intravenously. Full-ring PET imaging was performed from the skull base to thigh after the radiotracer. CT data was obtained and used for attenuation correction and anatomic  localization. Fasting blood glucose: 90 mg/dl COMPARISON:  07/02/2018 and CT neck 11/15/2017. FINDINGS: Mediastinal blood pool activity: SUV max 2.7 Liver activity: SUV max NA NECK: Right level-II lymph node measures 8 mm (3/63) with an SUV max of 5.8. There is asymmetric hypermetabolism within the left vocal cords, SUV max 13.8. No additional abnormal hypermetabolism in the neck. Incidental CT findings: None. CHEST: There are 2 hypermetabolic pathologically enlarged lymph  nodes in the superior mediastinum. Index high right paratracheal lymph node measures 2.5 cm with an SUV max of 16.0. A soft tissue nodule or lymph node medial to the proximal right clavicle measures 10 mm in short axis (3/79) with an SUV max of 5.4. No additional hypermetabolic hilar or axillary lymph nodes. A pulmonary nodule along the anterior margin of minor fissure measures 1.4 cm (3/113) with an SUV max of 9.1. Incidental CT findings: Atherosclerotic calcification of the aorta, aortic valve and coronary arteries. No pericardial or pleural effusion. ABDOMEN/PELVIS: No abnormal hypermetabolism in the liver, adrenal glands, spleen or pancreas. No hypermetabolic lymph nodes. Incidental CT findings: Low-attenuation lesion in the left hepatic lobe measures 1.5 cm, likely a cyst. There may be sludge in the gallbladder. Adrenal glands are unremarkable. Low-attenuation lesion in the upper pole right kidney measures 4.9 cm and is likely a cyst. A 1 mm stone is seen in the proximal left ureter (3/183), without hydronephrosis. There may be renal sinus cysts on the left. Spleen, pancreas, stomach and bowel are unremarkable. Bladder is low in volume. Prostate is mildly enlarged. Atherosclerotic calcification of the aorta. SKELETON: No abnormal osseous hypermetabolism. Incidental CT findings: None. IMPRESSION: 1. Asymmetric hypermetabolism involving the left vocal cords with hypermetabolic metastatic adenopathy in the neck and chest. Hypermetabolic  pulmonary nodule along the minor fissure is also felt to be metastatic. 2. Tiny proximal left ureteral stone without hydronephrosis. 3. Possible gallbladder sludge. 4. Enlarged prostate. 5. Aortic atherosclerosis (ICD10-170.0). Coronary artery calcification. Electronically Signed   By: Lorin Picket M.D.   On: 08/20/2019 14:32   [place] Fna Soft Tissue  Result Date: 09/08/2019 INDICATION: History of squamous cell carcinoma of the throat with PET avid right supraclavicular lymph node. EXAM: ULTRASOUND-GUIDED FNA AND CORE BIOPSY OF A RIGHT SUPRACLAVICULAR LYMPH NODE MEDICATIONS: None. ANESTHESIA/SEDATION: None FLUOROSCOPY TIME:  Not applicable COMPLICATIONS: None immediate. PROCEDURE: Informed written consent was obtained from the patient after a thorough discussion of the procedural risks, benefits and alternatives. All questions were addressed. Maximal Sterile Barrier Technique was utilized including caps, mask, sterile gowns, sterile gloves, sterile drape, hand hygiene and skin antiseptic. A timeout was performed prior to the initiation of the procedure. The patient was then prepped and draped in the sterile manner with chlorhexidine. Utilizing 1% xylocaine as local anesthetic and real-time ultrasound guidance a fine-needle aspiration was performed utilizing a 25 gauge needle. This sample was given to the cytotechnologist and deemed adequate. Subsequently multiple core biopsies were obtained of the right supraclavicular lymph node as well as a hypoechoic area surrounding the lymph nodes suspicious for localized invasion. These cores were given to the cytotechnologist for pathologic evaluation. Patient tolerated the procedure well. Needle was removed and hemostasis obtained at the puncture site. Patient was then returned his room in satisfactory condition. IMPRESSION: Successful fine-needle aspiration and core biopsy of a right supraclavicular lymph node. Electronically Signed   By: Inez Catalina M.D.   On:  09/08/2019 12:09     ASSESSMENT & PLAN:   1. 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[place])   2. Neoplasm related pain   3. Stage 3a chronic kidney disease   4. 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 S/p  cycle 1 Keytruda/carboplatin/5-FU day 1-4 treatments. NGS has been sent and is pending. Labs are reviewed and discussed with patient. Blood counts are stable.  Lymphocytopenia continue monitor.  CKD, creatinine 1.49.  Increased from last visit on 09/29/2019.  Proceed  with 1 L of IV fluid normal saline. Odynophagia, no cancer related pain, well controlled on oxycodone 5 mg 1 to 2 tablets a day as needed. Mouth sore, likely mucositis secondary to chemotherapy.  Offered to prescribe him mouthwash.  Patient prefers to use mouth rinse that was described from his dentist present.   All questions were answered. The patient knows to call the clinic with any problems questions or concerns. Return of visit: Repeat blood work in 1 week with IV fluid possible Z[place] Follow-up lab MD chemotherapy in 2 week    Earlie Server, MD, PhD Hematology [place] [facility] Pager- [phone] 10/06/2019

## 2019-10-06 NOTE — Progress Notes (Signed)
Patient is having a sore mouth.  He had a sore throat on Friday that he attributes to weather change

## 2019-10-08 ENCOUNTER — Other Ambulatory Visit: Payer: Self-pay

## 2019-10-09 ENCOUNTER — Inpatient Hospital Stay: Payer: PRIVATE HEALTH INSURANCE | Admitting: *Deleted

## 2019-10-09 ENCOUNTER — Inpatient Hospital Stay: Payer: PRIVATE HEALTH INSURANCE

## 2019-10-09 ENCOUNTER — Other Ambulatory Visit: Payer: Self-pay

## 2019-10-09 DIAGNOSIS — C109 Malignant neoplasm of oropharynx, unspecified: Secondary | ICD-10-CM

## 2019-10-09 DIAGNOSIS — Z95828 Presence of other vascular implants and grafts: Secondary | ICD-10-CM

## 2019-10-09 DIAGNOSIS — Z7189 Other specified counseling: Secondary | ICD-10-CM

## 2019-10-09 DIAGNOSIS — Z5111 Encounter for antineoplastic chemotherapy: Secondary | ICD-10-CM | POA: Diagnosis not present

## 2019-10-09 LAB — COMPREHENSIVE METABOLIC PANEL
ALT: 20 U/L (ref 0–44)
AST: 18 U/L (ref 15–41)
Albumin: 3.4 g/dL — ABNORMAL LOW (ref 3.5–5.0)
Alkaline Phosphatase: 71 U/L (ref 38–126)
Anion gap: 7 (ref 5–15)
BUN: 24 mg/dL — ABNORMAL HIGH (ref 8–23)
CO2: 25 mmol/L (ref 22–32)
Calcium: 8.7 mg/dL — ABNORMAL LOW (ref 8.9–10.3)
Chloride: 103 mmol/L (ref 98–111)
Creatinine, Ser: 1.18 mg/dL (ref 0.61–1.24)
GFR calc Af Amer: >60 mL/min (ref >60)
GFR calc non Af Amer: >60 mL/min (ref >60)
Glucose, Bld: 129 mg/dL — ABNORMAL HIGH (ref 70–99)
Potassium: 4.1 mmol/L (ref 3.5–5.1)
Sodium: 135 mmol/L (ref 135–145)
Total Bilirubin: 0.5 mg/dL (ref 0.3–1.2)
Total Protein: 6.2 g/dL — ABNORMAL LOW (ref 6.5–8.1)

## 2019-10-09 LAB — CBC WITH DIFFERENTIAL/PLATELET
Abs Immature Granulocytes: 0.03 10*3/uL (ref 0.00–0.07)
Basophils Absolute: 0 10*3/uL (ref 0.0–0.1)
Basophils Relative: 0 %
Eosinophils Absolute: 0.2 10*3/uL (ref 0.0–0.5)
Eosinophils Relative: 3 %
HCT: 38.9 % — ABNORMAL LOW (ref 39.0–52.0)
Hemoglobin: 13 g/dL (ref 13.0–17.0)
Immature Granulocytes: 1 %
Lymphocytes Relative: 9 %
Lymphs Abs: 0.6 10*3/uL — ABNORMAL LOW (ref 0.7–4.0)
MCH: 29.9 pg (ref 26.0–34.0)
MCHC: 33.4 g/dL (ref 30.0–36.0)
MCV: 89.4 fL (ref 80.0–100.0)
Monocytes Absolute: 0.5 10*3/uL (ref 0.1–1.0)
Monocytes Relative: 8 %
Neutro Abs: 5.1 10*3/uL (ref 1.7–7.7)
Neutrophils Relative %: 79 %
Platelets: 203 10*3/uL (ref 150–400)
RBC: 4.35 MIL/uL (ref 4.22–5.81)
RDW: 12.6 % (ref 11.5–15.5)
WBC: 6.4 10*3/uL (ref 4.0–10.5)
nRBC: 0 % (ref 0.0–0.2)

## 2019-10-09 MED ORDER — SODIUM CHLORIDE 0.9% FLUSH
10.0000 mL | Freq: Once | INTRAVENOUS | Status: AC
  Administered 2019-10-09: 11:00:00 10 mL via INTRAVENOUS
  Filled 2019-10-09: qty 10

## 2019-10-09 MED ORDER — HEPARIN SOD (PORK) LOCK FLUSH 100 UNIT/ML IV SOLN
INTRAVENOUS | Status: AC
  Filled 2019-10-09: qty 5

## 2019-10-09 MED ORDER — HEPARIN SOD (PORK) LOCK FLUSH 100 UNIT/ML IV SOLN
500.0000 [IU] | Freq: Once | INTRAVENOUS | Status: AC
  Administered 2019-10-09: 500 [IU] via INTRAVENOUS

## 2019-10-09 NOTE — Progress Notes (Signed)
Labs reviewed with MD and treatment team.Per pt he is "feeling good and has been eating and drinking well the last few days." Verbal order that pt does not need fluids today and may be deaccessed. Pt deaccessed and updated. All questions answered at this time.   Kalynne Womac [employer]

## 2019-10-13 ENCOUNTER — Other Ambulatory Visit: Payer: Self-pay

## 2019-10-13 ENCOUNTER — Inpatient Hospital Stay: Payer: PRIVATE HEALTH INSURANCE

## 2019-10-13 DIAGNOSIS — C109 Malignant neoplasm of oropharynx, unspecified: Secondary | ICD-10-CM

## 2019-10-13 DIAGNOSIS — Z95828 Presence of other vascular implants and grafts: Secondary | ICD-10-CM

## 2019-10-13 DIAGNOSIS — Z5111 Encounter for antineoplastic chemotherapy: Secondary | ICD-10-CM | POA: Diagnosis not present

## 2019-10-13 LAB — COMPREHENSIVE METABOLIC PANEL
ALT: 20 U/L (ref 0–44)
AST: 17 U/L (ref 15–41)
Albumin: 3.4 g/dL — ABNORMAL LOW (ref 3.5–5.0)
Alkaline Phosphatase: 75 U/L (ref 38–126)
Anion gap: 7 (ref 5–15)
BUN: 28 mg/dL — ABNORMAL HIGH (ref 8–23)
CO2: 26 mmol/L (ref 22–32)
Calcium: 9 mg/dL (ref 8.9–10.3)
Chloride: 105 mmol/L (ref 98–111)
Creatinine, Ser: 1.42 mg/dL — ABNORMAL HIGH (ref 0.61–1.24)
GFR calc Af Amer: >60 mL/min (ref >60)
GFR calc non Af Amer: 52 mL/min — ABNORMAL LOW (ref >60)
Glucose, Bld: 119 mg/dL — ABNORMAL HIGH (ref 70–99)
Potassium: 4.2 mmol/L (ref 3.5–5.1)
Sodium: 138 mmol/L (ref 135–145)
Total Bilirubin: 0.8 mg/dL (ref 0.3–1.2)
Total Protein: 6.8 g/dL (ref 6.5–8.1)

## 2019-10-13 LAB — CBC WITH DIFFERENTIAL/PLATELET
Abs Immature Granulocytes: 0.02 10*3/uL (ref 0.00–0.07)
Basophils Absolute: 0 10*3/uL (ref 0.0–0.1)
Basophils Relative: 1 %
Eosinophils Absolute: 0.2 10*3/uL (ref 0.0–0.5)
Eosinophils Relative: 4 %
HCT: 38.5 % — ABNORMAL LOW (ref 39.0–52.0)
Hemoglobin: 12.9 g/dL — ABNORMAL LOW (ref 13.0–17.0)
Immature Granulocytes: 1 %
Lymphocytes Relative: 13 %
Lymphs Abs: 0.5 10*3/uL — ABNORMAL LOW (ref 0.7–4.0)
MCH: 30.4 pg (ref 26.0–34.0)
MCHC: 33.5 g/dL (ref 30.0–36.0)
MCV: 90.6 fL (ref 80.0–100.0)
Monocytes Absolute: 0.4 10*3/uL (ref 0.1–1.0)
Monocytes Relative: 9 %
Neutro Abs: 3 10*3/uL (ref 1.7–7.7)
Neutrophils Relative %: 72 %
Platelets: 149 10*3/uL — ABNORMAL LOW (ref 150–400)
RBC: 4.25 MIL/uL (ref 4.22–5.81)
RDW: 13.2 % (ref 11.5–15.5)
WBC: 4 10*3/uL (ref 4.0–10.5)
nRBC: 0 % (ref 0.0–0.2)

## 2019-10-13 MED ORDER — SODIUM CHLORIDE 0.9 % IV SOLN
Freq: Once | INTRAVENOUS | Status: AC
  Administered 2019-10-13: 12:00:00 via INTRAVENOUS
  Filled 2019-10-13: qty 250

## 2019-10-13 MED ORDER — HEPARIN SOD (PORK) LOCK FLUSH 100 UNIT/ML IV SOLN
500.0000 [IU] | Freq: Once | INTRAVENOUS | Status: AC
  Administered 2019-10-13: 500 [IU] via INTRAVENOUS
  Filled 2019-10-13: qty 5

## 2019-10-13 MED ORDER — SODIUM CHLORIDE 0.9% FLUSH
10.0000 mL | Freq: Once | INTRAVENOUS | Status: AC
  Administered 2019-10-13: 10 mL via INTRAVENOUS
  Filled 2019-10-13: qty 10

## 2019-10-17 ENCOUNTER — Other Ambulatory Visit: Payer: Self-pay

## 2019-10-17 NOTE — Progress Notes (Signed)
Patient pre screened for office appointment, no questions or concerns today. Patient reminded of upcoming appointment time and date. 

## 2019-10-20 ENCOUNTER — Inpatient Hospital Stay (HOSPITAL_BASED_OUTPATIENT_CLINIC_OR_DEPARTMENT_OTHER): Payer: PRIVATE HEALTH INSURANCE | Admitting: Oncology

## 2019-10-20 ENCOUNTER — Encounter: Payer: Self-pay | Admitting: Oncology

## 2019-10-20 ENCOUNTER — Inpatient Hospital Stay: Payer: PRIVATE HEALTH INSURANCE

## 2019-10-20 ENCOUNTER — Other Ambulatory Visit: Payer: Self-pay

## 2019-10-20 VITALS — BP 131/82 | HR 101 | Temp 97.7°F | Resp 18 | Wt 182.1 lb

## 2019-10-20 VITALS — BP 132/81 | HR 92 | Resp 18

## 2019-10-20 DIAGNOSIS — N1831 Chronic kidney disease, stage 3a: Secondary | ICD-10-CM

## 2019-10-20 DIAGNOSIS — Z95828 Presence of other vascular implants and grafts: Secondary | ICD-10-CM

## 2019-10-20 DIAGNOSIS — Z5111 Encounter for antineoplastic chemotherapy: Secondary | ICD-10-CM | POA: Diagnosis not present

## 2019-10-20 DIAGNOSIS — C109 Malignant neoplasm of oropharynx, unspecified: Secondary | ICD-10-CM

## 2019-10-20 DIAGNOSIS — G893 Neoplasm related pain (acute) (chronic): Secondary | ICD-10-CM | POA: Diagnosis not present

## 2019-10-20 DIAGNOSIS — K1379 Other lesions of oral mucosa: Secondary | ICD-10-CM

## 2019-10-20 LAB — CBC WITH DIFFERENTIAL/PLATELET
Abs Immature Granulocytes: 0.03 10*3/uL (ref 0.00–0.07)
Basophils Absolute: 0 10*3/uL (ref 0.0–0.1)
Basophils Relative: 1 %
Eosinophils Absolute: 0.2 10*3/uL (ref 0.0–0.5)
Eosinophils Relative: 6 %
HCT: 40.4 % (ref 39.0–52.0)
Hemoglobin: 13.3 g/dL (ref 13.0–17.0)
Immature Granulocytes: 1 %
Lymphocytes Relative: 21 %
Lymphs Abs: 0.7 10*3/uL (ref 0.7–4.0)
MCH: 30.2 pg (ref 26.0–34.0)
MCHC: 32.9 g/dL (ref 30.0–36.0)
MCV: 91.6 fL (ref 80.0–100.0)
Monocytes Absolute: 0.4 10*3/uL (ref 0.1–1.0)
Monocytes Relative: 14 %
Neutro Abs: 1.8 10*3/uL (ref 1.7–7.7)
Neutrophils Relative %: 57 %
Platelets: 206 10*3/uL (ref 150–400)
RBC: 4.41 MIL/uL (ref 4.22–5.81)
RDW: 14.5 % (ref 11.5–15.5)
WBC: 3.1 10*3/uL — ABNORMAL LOW (ref 4.0–10.5)
nRBC: 0 % (ref 0.0–0.2)

## 2019-10-20 LAB — COMPREHENSIVE METABOLIC PANEL WITH GFR
ALT: 22 U/L (ref 0–44)
AST: 21 U/L (ref 15–41)
Albumin: 3.7 g/dL (ref 3.5–5.0)
Alkaline Phosphatase: 77 U/L (ref 38–126)
Anion gap: 6 (ref 5–15)
BUN: 31 mg/dL — ABNORMAL HIGH (ref 8–23)
CO2: 24 mmol/L (ref 22–32)
Calcium: 9.4 mg/dL (ref 8.9–10.3)
Chloride: 107 mmol/L (ref 98–111)
Creatinine, Ser: 1.33 mg/dL — ABNORMAL HIGH (ref 0.61–1.24)
GFR calc Af Amer: >60 mL/min
GFR calc non Af Amer: 56 mL/min — ABNORMAL LOW
Glucose, Bld: 95 mg/dL (ref 70–99)
Potassium: 4.2 mmol/L (ref 3.5–5.1)
Sodium: 137 mmol/L (ref 135–145)
Total Bilirubin: 0.6 mg/dL (ref 0.3–1.2)
Total Protein: 7 g/dL (ref 6.5–8.1)

## 2019-10-20 MED ORDER — SODIUM CHLORIDE 0.9 % IV SOLN
Freq: Once | INTRAVENOUS | Status: AC
  Administered 2019-10-20: 10:00:00 via INTRAVENOUS
  Filled 2019-10-20: qty 5

## 2019-10-20 MED ORDER — PALONOSETRON HCL INJECTION 0.25 MG/5ML
0.2500 mg | Freq: Once | INTRAVENOUS | Status: AC
  Administered 2019-10-20: 10:00:00 0.25 mg via INTRAVENOUS
  Filled 2019-10-20: qty 5

## 2019-10-20 MED ORDER — SODIUM CHLORIDE 0.9 % IV SOLN
440.0000 mg | Freq: Once | INTRAVENOUS | Status: AC
  Administered 2019-10-20: 440 mg via INTRAVENOUS
  Filled 2019-10-20: qty 44

## 2019-10-20 MED ORDER — SODIUM CHLORIDE 0.9% FLUSH
10.0000 mL | Freq: Once | INTRAVENOUS | Status: AC
  Administered 2019-10-20: 10 mL via INTRAVENOUS
  Filled 2019-10-20: qty 10

## 2019-10-20 MED ORDER — SODIUM CHLORIDE 0.9 % IV SOLN
200.0000 mg | Freq: Once | INTRAVENOUS | Status: AC
  Administered 2019-10-20: 200 mg via INTRAVENOUS
  Filled 2019-10-20: qty 8

## 2019-10-20 MED ORDER — OXYCODONE HCL 5 MG PO TABS: 5.0000 mg | ORAL_TABLET | Freq: Two times a day (BID) | ORAL | 0 refills | Status: DC | PRN

## 2019-10-20 MED ORDER — HEPARIN SOD (PORK) LOCK FLUSH 100 UNIT/ML IV SOLN: 500.0000 [IU] | Freq: Once | INTRAVENOUS | Status: DC

## 2019-10-20 MED ORDER — SODIUM CHLORIDE 0.9 % IV SOLN
Freq: Once | INTRAVENOUS | Status: AC
  Administered 2019-10-20: 09:00:00 via INTRAVENOUS
  Filled 2019-10-20: qty 250

## 2019-10-20 MED ORDER — SODIUM CHLORIDE 0.9 % IV SOLN
1000.0000 mg/m2/d | INTRAVENOUS | Status: DC
  Administered 2019-10-20: 7900 mg via INTRAVENOUS
  Filled 2019-10-20: qty 158

## 2019-10-20 NOTE — Progress Notes (Signed)
Hematology/Oncology  Follow Up note [facility] Telephone:([phone] [phone]   Patient Care Team: Albina Billet, MD as PCP - General (Internal Medicine) Albina Billet, MD (Internal Medicine) Bary Castilla, Forest Gleason, MD (General Surgery) Noreene Filbert, MD as Referring Physician (Radiation Oncology) Earlie Server, MD as Consulting Physician (Oncology)  REFERRING PROVIDER: Dr.McQueen CHIEF COMPLAINTS/PURPOSE OF CONSULTATION:  Evaluation of newly diagnosed head and neck cancer.  HISTORY OF PRESENTING ILLNESS:  Ivan Garcia. is a  63 y.o.  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 P16 positive. cT2 cN2 disease, stage II, Concurrent ChemoRT  Cisplatin 100 mg/m2 q3weeks with concurrent RT which started on 12/31/2017.  S/p one dose of Cisplatin. Cisplatin discontinued due to nephrotoxicity. Switch to weekly Carboplatin (AUC 2) / Taxol 55m/m2 [ finished May 2019] Patient has finished concurrent chemoradiation in May 2019  # He reports doing well until in July 2020, he started to have hoarseness after singing for 2 to 3 hours. He has changed his insurance and Dr. MTami Ribasis no longer covered by his current insurance.  So he establish care with Dr.Camar [place]04/2019. Flexible laryngoscopy showed Vocal cord paralysis.  Patient was recommended to have a PET scan done for restaging. Patient's primary care provider Dr. THall Busingordered a PET scan for patient which was done on 08/20/2019.  PET scan unfortunately showed metastatic disease.  Patient called cancer center and is scheduled follow-up with me. 08/20/2019 PET scan showed asymmetric hypermetabolic him involving the left vocal cords with hypermetabolic adenopathy in the neck, and the chest.  He also has hypermetabolic pulmonary nodule along the minor  fissure which is felt to be metastatic. Tiny proximal left ureteral stone without hydronephrosis.  Possible gallbladder sludge.  Enlarged prostate.  Atherosclerosis.  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   INTERVAL HISTORY TDanis PembletonHDelmas Faucett is a 63y.o. male who presents for follow-up metastatic squamous cell carcinoma, P 16+.   Evaluation prior to chemotherapy. Tolerated cycle 1 chemotherapy 3 weeks ago. He had mouth sore for a few days which resolved now. Chronic fatigue at baseline. Denies any nausea vomiting diarrhea, chest pain, abdominal pain or leg swelling No fever or chills. #Odynophagia, neoplasm related pain, takes oxycodone 5 mg every 12 hours as needed.  Reports pain is well controlled.  Request refill.  NGS showed PD-L1 CPS 20, TMB indeterminant, MSI stable,PIK3CA mutation  Review of Systems  Constitutional: Negative for chills, fever, malaise/fatigue and weight loss.  HENT: Negative for sore throat.        Sore throat, hoarseness  neck pain  Eyes: Negative for redness.  Respiratory: Negative for cough, shortness of breath and wheezing.   Cardiovascular: Negative for chest pain, palpitations and leg swelling.  Gastrointestinal: Negative for abdominal pain, blood in stool, nausea and vomiting.  Genitourinary: Negative for dysuria.  Musculoskeletal: Negative for myalgias.  Skin: Negative for rash.  Neurological: Negative for dizziness, tingling and tremors.  Endo/Heme/Allergies: Does not bruise/bleed easily.  Psychiatric/Behavioral: Negative for hallucinations.    MEDICAL HISTORY:  Past Medical History:  Diagnosis Date   Arthritis    Cancer ([place]    Head and neck cancer   Hemorrhoids    Hyperlipidemia    Hypertension     SURGICAL HISTORY: Past Surgical History:  Procedure Laterality Date   COLONOSCOPY WITH PROPOFOL N/A 04/04/2017   Procedure: COLONOSCOPY WITH  PROPOFOL;  Surgeon: Robert Bellow, MD;  Location: [facility] ENDOSCOPY;  Service: Endoscopy;  Laterality: N/A;   MANDIBLE SURGERY     PORT-A-CATH REMOVAL  07/17/2018   PORTA CATH INSERTION N/A 12/19/2017   Procedure: PORTA CATH INSERTION;  Surgeon: Algernon Huxley, MD;  Location: [place] [facility];  Service: Cardiovascular;  Laterality: N/A;   PORTA CATH INSERTION N/A 07/17/2018   Procedure: PORTA CATH INSERTION;  Surgeon: Algernon Huxley, MD;  Location: [place] [facility];  Service: Cardiovascular;  Laterality: N/A;   PORTA CATH INSERTION N/A 09/18/2019   Procedure: PORTA CATH INSERTION;  Surgeon: Algernon Huxley, MD;  Location: [place] [facility];  Service: Cardiovascular;  Laterality: N/A;   TONSILLECTOMY      SOCIAL HISTORY: Social History   Socioeconomic History   Marital status: Single    Spouse name: Not on file   Number of children: Not on file   Years of education: Not on file   Highest education level: Not on file  Occupational History   Not on file  Social Needs   Financial resource strain: Not on file   Food insecurity    Worry: Not on file    Inability: Not on file   Transportation needs    Medical: Not on file    Non-medical: Not on file  Tobacco Use   Smoking status: Never Smoker   Smokeless tobacco: Never Used  Substance and Sexual Activity   Alcohol use: No   Drug use: No   Sexual activity: Not on file  Lifestyle   Physical activity    Days per week: Not on file    Minutes per session: Not on file   Stress: Not on file  Relationships   Social connections    Talks on phone: Not on file    Gets together: Not on file    Attends religious service: Not on file    Active member of club or organization: Not on file    Attends meetings of clubs or organizations: Not on file    Relationship status: Not on file   Intimate partner violence    Fear of current or ex partner: Not on file    Emotionally abused: Not on file    Physically abused:  Not on file    Forced sexual activity: Not on file  Other Topics Concern   Not on file  Social History Narrative   Not on file    FAMILY HISTORY: Family History  Problem Relation Age of Onset   Breast cancer Mother    Asthma Mother    Congestive Heart Failure Mother    Prostate cancer Father    Brain cancer Father    Bladder Cancer Father     ALLERGIES:  has No Known Allergies.  MEDICATIONS:  Current Outpatient Medications  Medication Sig Dispense Refill   amLODipine (NORVASC) 5 MG tablet Take 5 mg by mouth daily.     dexamethasone (DECADRON) 4 MG tablet Take 2 tablets (8 mg total) by mouth daily. Start the day after carboplatin chemotherapy for 3 days. 30 tablet 1   lidocaine-prilocaine (EMLA) cream Apply to affected area once 30 g 3   Multiple Vitamins-Minerals (MULTIVITAMIN WITH MINERALS) tablet Take 1 tablet by mouth daily.     ondansetron (ZOFRAN) 8 MG tablet Take 1 tablet (8 mg total) by mouth 2 (two) times daily as needed  for refractory nausea / vomiting. Start on day 3 after carboplatin chemo. 30 tablet 1   oxyCODONE (ROXICODONE) 5 MG immediate release tablet Take 1 tablet (5 mg total) by mouth every 12 (twelve) hours as needed for moderate pain or severe pain. 30 tablet 0   prochlorperazine (COMPAZINE) 10 MG tablet Take 1 tablet (10 mg total) by mouth every 6 (six) hours as needed (Nausea or vomiting). 30 tablet 1   rosuvastatin (CRESTOR) 10 MG tablet Take 10 mg by mouth every evening.  4   No current facility-administered medications for this visit.    Facility-Administered Medications Ordered in Other Visits  Medication Dose Route Frequency Provider Last Rate Last Dose   heparin lock flush 100 unit/mL  500 Units Intravenous Once Earlie Server, MD         PHYSICAL EXAMINATION: ECOG PERFORMANCE STATUS: 1 - Symptomatic but completely ambulatory Vitals:   10/20/19 0837  BP: 131/82  Pulse: (!) 101  Resp: 18  Temp: 97.7 F (36.5 C)   Physical Exam    Constitutional: He is oriented to person, place, and time and well-developed, well-nourished, and in no distress. No distress.  HENT:  Head: Normocephalic and atraumatic.  Mouth/Throat: No oropharyngeal exudate.  No thrush Dry oral mucosa  Eyes: Pupils are equal, round, and reactive to light. Conjunctivae and EOM are normal. Left eye exhibits no discharge. No scleral icterus.  Neck: Normal range of motion. Neck supple. No thyromegaly present.  Cardiovascular: Normal rate, regular rhythm and normal heart sounds. Exam reveals no gallop and no friction rub.  No murmur heard. Pulmonary/Chest: Effort normal and breath sounds normal. No respiratory distress. He has no wheezes. He has no rales. He exhibits no tenderness.  Abdominal: Soft. Bowel sounds are normal. He exhibits no distension. There is no abdominal tenderness. There is no rebound.  Musculoskeletal: Normal range of motion.        General: No tenderness, deformity or edema.  Lymphadenopathy:    He has no cervical adenopathy.  Neurological: He is alert and oriented to person, place, and time. No cranial nerve deficit. He exhibits normal muscle tone. Coordination normal.  Skin: Skin is warm and dry. He is not diaphoretic. No erythema.  Psychiatric: Affect normal.     LABORATORY DATA:  I have reviewed the data as listed Lab Results  Component Value Date   WBC 3.1 (L) 10/20/2019   HGB 13.3 10/20/2019   HCT 40.4 10/20/2019   MCV 91.6 10/20/2019   PLT 206 10/20/2019   Recent Labs    10/06/19 0907 10/09/19 1128 10/13/19 1034  NA 134* 135 138  K 4.4 4.1 4.2  CL 100 103 105  CO2 _0 GLUCOSE 139* 129* 119*  BUN 30* 24* 28*  CREATININE 1.49* 1.18 1.42*  CALCIUM 8.9 8.7* 9.0  GFRNONAA 49* >60 52*  GFRAA 57* >60 >60  PROT 6.9 6.2* 6.8  ALBUMIN 3.7 3.4* 3.4*  AST _1 ALT _2 ALKPHOS 77 71 75  BILITOT 1.0 0.5 0.8  RADIOGRAPHIC STUDIES: I have personally reviewed the radiological images as listed and  agreed with the findings in the report. Nm Pet Image Initial (pi) Skull Base To Thigh  Result Date: 08/20/2019 CLINICAL DATA:  Subsequent treatment strategy for throat cancer. EXAM: NUCLEAR MEDICINE PET SKULL BASE TO THIGH TECHNIQUE: 9.8 mCi F-18 FDG was injected intravenously. Full-ring PET imaging was performed from the skull base to thigh after the radiotracer. CT data was obtained and  used for attenuation correction and anatomic localization. Fasting blood glucose: 90 mg/dl COMPARISON:  07/02/2018 and CT neck 11/15/2017. FINDINGS: Mediastinal blood pool activity: SUV max 2.7 Liver activity: SUV max NA NECK: Right level-II lymph node measures 8 mm (3/63) with an SUV max of 5.8. There is asymmetric hypermetabolism within the left vocal cords, SUV max 13.8. No additional abnormal hypermetabolism in the neck. Incidental CT findings: None. CHEST: There are 2 hypermetabolic pathologically enlarged lymph nodes in the superior mediastinum. Index high right paratracheal lymph node measures 2.5 cm with an SUV max of 16.0. A soft tissue nodule or lymph node medial to the proximal right clavicle measures 10 mm in short axis (3/79) with an SUV max of 5.4. No additional hypermetabolic hilar or axillary lymph nodes. A pulmonary nodule along the anterior margin of minor fissure measures 1.4 cm (3/113) with an SUV max of 9.1. Incidental CT findings: Atherosclerotic calcification of the aorta, aortic valve and coronary arteries. No pericardial or pleural effusion. ABDOMEN/PELVIS: No abnormal hypermetabolism in the liver, adrenal glands, spleen or pancreas. No hypermetabolic lymph nodes. Incidental CT findings: Low-attenuation lesion in the left hepatic lobe measures 1.5 cm, likely a cyst. There may be sludge in the gallbladder. Adrenal glands are unremarkable. Low-attenuation lesion in the upper pole right kidney measures 4.9 cm and is likely a cyst. A 1 mm stone is seen in the proximal left ureter (3/183), without  hydronephrosis. There may be renal sinus cysts on the left. Spleen, pancreas, stomach and bowel are unremarkable. Bladder is low in volume. Prostate is mildly enlarged. Atherosclerotic calcification of the aorta. SKELETON: No abnormal osseous hypermetabolism. Incidental CT findings: None. IMPRESSION: 1. Asymmetric hypermetabolism involving the left vocal cords with hypermetabolic metastatic adenopathy in the neck and chest. Hypermetabolic pulmonary nodule along the minor fissure is also felt to be metastatic. 2. Tiny proximal left ureteral stone without hydronephrosis. 3. Possible gallbladder sludge. 4. Enlarged prostate. 5. Aortic atherosclerosis (ICD10-170.0). Coronary artery calcification. Electronically Signed   By: Lorin Picket M.D.   On: 08/20/2019 14:32   [place] Fna Soft Tissue  Result Date: 09/08/2019 INDICATION: History of squamous cell carcinoma of the throat with PET avid right supraclavicular lymph node. EXAM: ULTRASOUND-GUIDED FNA AND CORE BIOPSY OF A RIGHT SUPRACLAVICULAR LYMPH NODE MEDICATIONS: None. ANESTHESIA/SEDATION: None FLUOROSCOPY TIME:  Not applicable COMPLICATIONS: None immediate. PROCEDURE: Informed written consent was obtained from the patient after a thorough discussion of the procedural risks, benefits and alternatives. All questions were addressed. Maximal Sterile Barrier Technique was utilized including caps, mask, sterile gowns, sterile gloves, sterile drape, hand hygiene and skin antiseptic. A timeout was performed prior to the initiation of the procedure. The patient was then prepped and draped in the sterile manner with chlorhexidine. Utilizing 1% xylocaine as local anesthetic and real-time ultrasound guidance a fine-needle aspiration was performed utilizing a 25 gauge needle. This sample was given to the cytotechnologist and deemed adequate. Subsequently multiple core biopsies were obtained of the right supraclavicular lymph node as well as a hypoechoic area surrounding the  lymph nodes suspicious for localized invasion. These cores were given to the cytotechnologist for pathologic evaluation. Patient tolerated the procedure well. Needle was removed and hemostasis obtained at the puncture site. Patient was then returned his room in satisfactory condition. IMPRESSION: Successful fine-needle aspiration and core biopsy of a right supraclavicular lymph node. Electronically Signed   By: Inez Catalina M.D.   On: 09/08/2019 12:09     ASSESSMENT & PLAN:   1. Squamous cell carcinoma of  oropharynx ([place])   2. Port-A-Cath in place   3. Neoplasm related pain   4. Stage 3a chronic kidney disease   5. Mouth sore   6. 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 S/p  cycle 1 Keytruda/carboplatin/5-FU day 1-4 treatments. Labs are reviewed and discussed with patient. Counts are acceptable to proceed with cycle 2 chemotherapy today.  #Leukopenia, normal differential.  ANC 1, lymphocyte 0.7. Continue to monitor.  #Chronic kidney disease, creatinine #Odynophagia, well controlled on oxycodone 5 mg every 12 hours as needed.  I sent him a refill of 30 tablets today. #Mouth sore, mucositis secondary to chemotherapy.  Continue oral rinse-Magic mouthwash was prescribed by his dentist.. #CKD, stage IIIa, avoid nephrotoxins.  Monitor.  Creatinine 1.33, slightly better.  We have discussed about him to follow-up with nephrologist All questions were answered. The patient knows to call the clinic with any problems questions or concerns.  Repeat CBC in 8 to 10 days.  If ANC less than 1, will proceed with Zarxio.  Follow-up lab MD chemotherapy in 3 weeks   Earlie Server, MD, PhD Hematology Oncology [facility] at [facility] Pager- [phone] 10/20/2019

## 2019-10-20 NOTE — Progress Notes (Signed)
Nutrition Follow-up:  Patient requested to speak with RD during infusion today.    RD last saw patient in 3135.  63 year old male now with recurrent squamous cell cancer of oropharynx, now metastatic (cervical lymphadenopathy, lung).  Patient currently receiving keytruda, carboplatin and 5-FU.    Met with patient during infusion per patient request.  Reports that he remembers what we discussed in 2019 during first go round with treatment.  Reports that he has been trying to add additional calories and protein into diet.  Drinking shakes.  Reports he lost weight originally but has gained most of it back.   Reports that he has been using magic mouthwash and that has helped his mouth sores/pain  Medications: reviewed  Labs: BUN 31, creatinine 1.33  Anthropometrics:   Weight today 182 lb increased from 172 lb on 11/2.  Noted 194 lb in June 2019  6% weight loss in the last 5 months   Estimated Energy Needs  Kcals: 2460-2800 Protein: 123-140 g Fluid: > 2 L  NUTRITION DIAGNOSIS: Inadequate oral intake related to cancer and cancer related treatment side effects as evidenced by 6%  weight loss    INTERVENTION:  Reviewed strategies to increase calories and protein.  Patient reports that he remembers the strategies that we discussed in the past and has been doing these things. Encouraged intake of oral nutrition supplement shakes for added nutrition Patient reports that he will contact RD if needed in the future.  Still has contact information    MONITORING, EVALUATION, GOAL: Patient will consume adequate calories and protein to prevent weight loss   NEXT VISIT: as needed  Shaya Reddick [name], [place], [place] Registered Dietitian [phone] (pager)

## 2019-10-20 NOTE — Progress Notes (Signed)
Patient reports the pain he is having in his upper bilateral chest area is 7/10 today and needs a refill on oxycodone.

## 2019-10-20 NOTE — Progress Notes (Signed)
At 1300 pt stated that he felt nausea and lightheaded. Vitals were taken and were stable, see flowsheets for vitals. Carboplatin infusion finished at 1256. Per pt he has not eaten since 0600 this morning and usually eats multiple times throughout the day. RN provided multiple snacks to pt. MD made aware, verbal order to proceed with 5FU pump. After pt finished his snacks he stated that he was starting to feel better. VSS and  5FU pump started per order. Pt stable for discharge. RN informed pt to call the clinic if he experiences any complications at home. Pt verbalized understanding and all questions answered at this time.   Ivan Garcia [employer]

## 2019-10-23 ENCOUNTER — Telehealth: Payer: Self-pay

## 2019-10-23 MED ORDER — OMEPRAZOLE 20 MG PO CPDR: 20.0000 mg | DELAYED_RELEASE_CAPSULE | Freq: Every day | ORAL | 1 refills | Status: DC

## 2019-10-23 NOTE — Telephone Encounter (Signed)
Patient left voicemail stating that since he started chemo he has been having acid reflux. He is requesting for Dr. Tasia Catchings to call in a medication for acid [facility]. He would for medication to be generic since that is what his insurance approves.

## 2019-10-23 NOTE — Telephone Encounter (Signed)
Error

## 2019-10-23 NOTE — Telephone Encounter (Signed)
Prescription for Omeprazole 20mg  sent to [facility].

## 2019-10-24 ENCOUNTER — Other Ambulatory Visit: Payer: Self-pay

## 2019-10-24 ENCOUNTER — Inpatient Hospital Stay: Payer: PRIVATE HEALTH INSURANCE

## 2019-10-24 DIAGNOSIS — C109 Malignant neoplasm of oropharynx, unspecified: Secondary | ICD-10-CM

## 2019-10-24 DIAGNOSIS — Z5111 Encounter for antineoplastic chemotherapy: Secondary | ICD-10-CM | POA: Diagnosis not present

## 2019-10-24 MED ORDER — HEPARIN SOD (PORK) LOCK FLUSH 100 UNIT/ML IV SOLN
INTRAVENOUS | Status: AC
  Filled 2019-10-24: qty 5

## 2019-10-24 MED ORDER — SODIUM CHLORIDE 0.9% FLUSH
10.0000 mL | INTRAVENOUS | Status: DC | PRN
  Administered 2019-10-24: 13:00:00 10 mL
  Filled 2019-10-24: qty 10

## 2019-10-24 MED ORDER — HEPARIN SOD (PORK) LOCK FLUSH 100 UNIT/ML IV SOLN
500.0000 [IU] | Freq: Once | INTRAVENOUS | Status: AC | PRN
  Administered 2019-10-24: 500 [IU]

## 2019-10-27 ENCOUNTER — Inpatient Hospital Stay: Payer: PRIVATE HEALTH INSURANCE

## 2019-10-27 ENCOUNTER — Other Ambulatory Visit: Payer: Self-pay

## 2019-10-27 DIAGNOSIS — Z5111 Encounter for antineoplastic chemotherapy: Secondary | ICD-10-CM | POA: Diagnosis not present

## 2019-10-27 DIAGNOSIS — C109 Malignant neoplasm of oropharynx, unspecified: Secondary | ICD-10-CM

## 2019-10-27 LAB — CBC WITH DIFFERENTIAL/PLATELET
Abs Immature Granulocytes: 0.04 10*3/uL (ref 0.00–0.07)
Basophils Absolute: 0 10*3/uL (ref 0.0–0.1)
Basophils Relative: 0 %
Eosinophils Absolute: 0 10*3/uL (ref 0.0–0.5)
Eosinophils Relative: 0 %
HCT: 38.7 % — ABNORMAL LOW (ref 39.0–52.0)
Hemoglobin: 12.6 g/dL — ABNORMAL LOW (ref 13.0–17.0)
Immature Granulocytes: 1 %
Lymphocytes Relative: 7 %
Lymphs Abs: 0.4 10*3/uL — ABNORMAL LOW (ref 0.7–4.0)
MCH: 30.2 pg (ref 26.0–34.0)
MCHC: 32.6 g/dL (ref 30.0–36.0)
MCV: 92.8 fL (ref 80.0–100.0)
Monocytes Absolute: 0.2 10*3/uL (ref 0.1–1.0)
Monocytes Relative: 3 %
Neutro Abs: 5.1 10*3/uL (ref 1.7–7.7)
Neutrophils Relative %: 89 %
Platelets: 236 10*3/uL (ref 150–400)
RBC: 4.17 MIL/uL — ABNORMAL LOW (ref 4.22–5.81)
RDW: 14.2 % (ref 11.5–15.5)
WBC: 5.7 10*3/uL (ref 4.0–10.5)
nRBC: 0 % (ref 0.0–0.2)

## 2019-11-10 ENCOUNTER — Inpatient Hospital Stay (HOSPITAL_BASED_OUTPATIENT_CLINIC_OR_DEPARTMENT_OTHER): Payer: PRIVATE HEALTH INSURANCE | Admitting: Oncology

## 2019-11-10 ENCOUNTER — Inpatient Hospital Stay: Payer: PRIVATE HEALTH INSURANCE

## 2019-11-10 ENCOUNTER — Other Ambulatory Visit: Payer: Self-pay

## 2019-11-10 ENCOUNTER — Inpatient Hospital Stay: Payer: PRIVATE HEALTH INSURANCE | Attending: Oncology

## 2019-11-10 ENCOUNTER — Encounter: Payer: Self-pay | Admitting: Oncology

## 2019-11-10 VITALS — BP 124/77 | HR 87 | Resp 18

## 2019-11-10 VITALS — BP 137/86 | HR 95 | Temp 96.4°F | Wt 187.9 lb

## 2019-11-10 DIAGNOSIS — G893 Neoplasm related pain (acute) (chronic): Secondary | ICD-10-CM

## 2019-11-10 DIAGNOSIS — C109 Malignant neoplasm of oropharynx, unspecified: Secondary | ICD-10-CM

## 2019-11-10 DIAGNOSIS — K1379 Other lesions of oral mucosa: Secondary | ICD-10-CM

## 2019-11-10 DIAGNOSIS — Z5111 Encounter for antineoplastic chemotherapy: Secondary | ICD-10-CM

## 2019-11-10 DIAGNOSIS — C7800 Secondary malignant neoplasm of unspecified lung: Secondary | ICD-10-CM | POA: Insufficient documentation

## 2019-11-10 LAB — COMPREHENSIVE METABOLIC PANEL
ALT: 21 U/L (ref 0–44)
AST: 21 U/L (ref 15–41)
Albumin: 3.4 g/dL — ABNORMAL LOW (ref 3.5–5.0)
Alkaline Phosphatase: 81 U/L (ref 38–126)
Anion gap: 6 (ref 5–15)
BUN: 36 mg/dL — ABNORMAL HIGH (ref 8–23)
CO2: 24 mmol/L (ref 22–32)
Calcium: 9 mg/dL (ref 8.9–10.3)
Chloride: 109 mmol/L (ref 98–111)
Creatinine, Ser: 1.43 mg/dL — ABNORMAL HIGH (ref 0.61–1.24)
GFR calc Af Amer: 60 mL/min — ABNORMAL LOW (ref >60)
GFR calc non Af Amer: 51 mL/min — ABNORMAL LOW (ref >60)
Glucose, Bld: 83 mg/dL (ref 70–99)
Potassium: 4.4 mmol/L (ref 3.5–5.1)
Sodium: 139 mmol/L (ref 135–145)
Total Bilirubin: 0.5 mg/dL (ref 0.3–1.2)
Total Protein: 6.7 g/dL (ref 6.5–8.1)

## 2019-11-10 LAB — CBC WITH DIFFERENTIAL/PLATELET
Abs Immature Granulocytes: 0.13 10*3/uL — ABNORMAL HIGH (ref 0.00–0.07)
Basophils Absolute: 0.1 10*3/uL (ref 0.0–0.1)
Basophils Relative: 2 %
Eosinophils Absolute: 0.2 10*3/uL (ref 0.0–0.5)
Eosinophils Relative: 5 %
HCT: 38.3 % — ABNORMAL LOW (ref 39.0–52.0)
Hemoglobin: 12.6 g/dL — ABNORMAL LOW (ref 13.0–17.0)
Immature Granulocytes: 4 %
Lymphocytes Relative: 19 %
Lymphs Abs: 0.6 10*3/uL — ABNORMAL LOW (ref 0.7–4.0)
MCH: 30.7 pg (ref 26.0–34.0)
MCHC: 32.9 g/dL (ref 30.0–36.0)
MCV: 93.2 fL (ref 80.0–100.0)
Monocytes Absolute: 0.6 10*3/uL (ref 0.1–1.0)
Monocytes Relative: 19 %
Neutro Abs: 1.7 10*3/uL (ref 1.7–7.7)
Neutrophils Relative %: 51 %
Platelets: 245 10*3/uL (ref 150–400)
RBC: 4.11 MIL/uL — ABNORMAL LOW (ref 4.22–5.81)
RDW: 15.5 % (ref 11.5–15.5)
WBC: 3.2 10*3/uL — ABNORMAL LOW (ref 4.0–10.5)
nRBC: 0.6 % — ABNORMAL HIGH (ref 0.0–0.2)

## 2019-11-10 MED ORDER — SODIUM CHLORIDE 0.9 % IV SOLN
200.0000 mg | Freq: Once | INTRAVENOUS | Status: AC
  Administered 2019-11-10: 200 mg via INTRAVENOUS
  Filled 2019-11-10: qty 8

## 2019-11-10 MED ORDER — HEPARIN SOD (PORK) LOCK FLUSH 100 UNIT/ML IV SOLN: 500.0000 [IU] | Freq: Once | INTRAVENOUS | Status: DC

## 2019-11-10 MED ORDER — SODIUM CHLORIDE 0.9 % IV SOLN
440.0000 mg | Freq: Once | INTRAVENOUS | Status: AC
  Administered 2019-11-10: 440 mg via INTRAVENOUS
  Filled 2019-11-10: qty 44

## 2019-11-10 MED ORDER — SODIUM CHLORIDE 0.9 % IV SOLN
Freq: Once | INTRAVENOUS | Status: AC
  Administered 2019-11-10: 10:00:00 via INTRAVENOUS
  Filled 2019-11-10: qty 5

## 2019-11-10 MED ORDER — SODIUM CHLORIDE 0.9% FLUSH
10.0000 mL | Freq: Once | INTRAVENOUS | Status: AC
  Administered 2019-11-10: 10 mL via INTRAVENOUS
  Filled 2019-11-10: qty 10

## 2019-11-10 MED ORDER — SODIUM CHLORIDE 0.9 % IV SOLN
Freq: Once | INTRAVENOUS | Status: AC
  Administered 2019-11-10: 09:00:00 via INTRAVENOUS
  Filled 2019-11-10: qty 250

## 2019-11-10 MED ORDER — SODIUM CHLORIDE 0.9% FLUSH
10.0000 mL | INTRAVENOUS | Status: DC | PRN
  Administered 2019-11-10: 10 mL
  Filled 2019-11-10: qty 10

## 2019-11-10 MED ORDER — OXYCODONE HCL 5 MG PO TABS: 5.0000 mg | ORAL_TABLET | Freq: Two times a day (BID) | ORAL | 0 refills | Status: DC | PRN

## 2019-11-10 MED ORDER — PALONOSETRON HCL INJECTION 0.25 MG/5ML
0.2500 mg | Freq: Once | INTRAVENOUS | Status: AC
  Administered 2019-11-10: 0.25 mg via INTRAVENOUS
  Filled 2019-11-10: qty 5

## 2019-11-10 MED ORDER — SODIUM CHLORIDE 0.9 % IV SOLN
900.0000 mg/m2/d | INTRAVENOUS | Status: DC
  Administered 2019-11-10: 13:00:00 7150 mg via INTRAVENOUS
  Filled 2019-11-10: qty 100

## 2019-11-10 NOTE — Progress Notes (Signed)
Hematology/Oncology  Follow Up note [facility] [phone] [phone]   Patient Care Team: Albina Billet, MD as PCP - General (Internal Medicine) Albina Billet, MD (Internal Medicine) Bary Castilla, Forest Gleason, MD (General Surgery) Noreene Filbert, MD as Referring Physician (Radiation Oncology) Earlie Server, MD as Consulting Physician (Oncology)  REFERRING PROVIDER: Dr.McQueen CHIEF COMPLAINTS/PURPOSE OF CONSULTATION:  Evaluation of newly diagnosed head and neck cancer.  HISTORY OF PRESENTING ILLNESS:  Ivan Garcia. is a  63 y.o.  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 P16 positive. cT2 cN2 disease, stage II, Concurrent ChemoRT  Cisplatin 100 mg/m2 q3weeks with concurrent RT which started on 12/31/2017.  S/p one dose of Cisplatin. Cisplatin discontinued due to nephrotoxicity. Switch to weekly Carboplatin (AUC 2) / Taxol 98m/m2 [ finished May 2019] Patient has finished concurrent chemoradiation in May 2019  # He reports doing well until in July 2020, he started to have hoarseness after singing for 2 to 3 hours. He has changed his insurance and Dr. MTami Ribasis no longer covered by his current insurance.  So he establish care with Dr.Delanee Xin KJuliann Pulseat DLa Blancaand was seen on 08/04/2019. Flexible laryngoscopy showed Vocal cord paralysis.  Patient was recommended to have a PET scan done for restaging. Patient's primary care provider Dr. THall Busingordered a PET scan for patient which was done on 08/20/2019.  PET scan unfortunately showed metastatic disease.  Patient called cancer center and is scheduled follow-up with me. 08/20/2019 PET scan showed asymmetric hypermetabolic him involving the left vocal cords with hypermetabolic adenopathy in the neck, and the chest.  He also has hypermetabolic pulmonary nodule along the minor  fissure which is felt to be metastatic. Tiny proximal left ureteral stone without hydronephrosis.  Possible gallbladder sludge.  Enlarged prostate.  Atherosclerosis.  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  # NGS showed PD-L1 CPS 20, TMB indeterminant, MSI stable,PIK3CA mutation  INTERVAL HISTORY TEduar KumpfHViraj Garcia is a 63y.o. male who presents for follow-up metastatic squamous cell carcinoma, P 16+.   Evaluation prior to chemotherapy. He is s/p 2 cycles of carboplatin, 5-FU and Keytruda.  He tolerates with moderate difficulties.  His main concerns today is severe mouth pain behind his back teeth.  Tolerated cycle 1 chemotherapy 3 weeks ago. He had mouth sore for a few days which has resolved now.He has previously tried Peridex, Magic mouth wash. His dentist prescribed him viscous lidocaine which helps a bit. He takes oxycodone 565mevery 12 hours as needed for pain and requests refills.     Review of Systems  Constitutional: Negative for chills, fever, malaise/fatigue and weight loss.  HENT: Negative for sore throat.         hoarseness has improved.  Mouth pain.   Eyes: Negative for redness.  Respiratory: Negative for cough, shortness of breath and wheezing.   Cardiovascular: Negative for chest pain, palpitations and leg swelling.  Gastrointestinal: Negative for abdominal pain, blood in stool, nausea and vomiting.  Genitourinary: Negative for dysuria.  Musculoskeletal: Negative for myalgias.  Skin: Negative for rash.  Neurological: Negative for dizziness, tingling and tremors.  Endo/Heme/Allergies: Does not bruise/bleed easily.  Psychiatric/Behavioral: Negative for hallucinations.    MEDICAL HISTORY:  Past Medical History:  Diagnosis Date   Arthritis    [place]   Hemorrhoids    Hyperlipidemia    Hypertension     SURGICAL HISTORY: Past Surgical  History:  Procedure Laterality Date   COLONOSCOPY WITH PROPOFOL N/A 04/04/2017   Procedure: COLONOSCOPY WITH PROPOFOL;  Surgeon: Robert Bellow, MD;  Location: [facility] ENDOSCOPY;  Service: Endoscopy;  Laterality: N/A;   MANDIBLE SURGERY     PORT-A-CATH REMOVAL  07/17/2018   PORTA CATH INSERTION N/A 12/19/2017   Procedure: PORTA CATH INSERTION;  Surgeon: Algernon Huxley, MD;  Location: [place] [facility];  Service: Cardiovascular;  Laterality: N/A;   PORTA CATH INSERTION N/A 07/17/2018   Procedure: PORTA CATH INSERTION;  Surgeon: Algernon Huxley, MD;  Location: [place] [facility];  Service: Cardiovascular;  Laterality: N/A;   PORTA CATH INSERTION N/A 09/18/2019   Procedure: PORTA CATH INSERTION;  Surgeon: Algernon Huxley, MD;  Location: [place] [facility];  Service: Cardiovascular;  Laterality: N/A;   TONSILLECTOMY      SOCIAL HISTORY: Social History   Socioeconomic History   Marital status: Single    Spouse name: Not on file   Number of children: Not on file   Years of education: Not on file   Highest education level: Not on file  Occupational History   Not on file  Social Needs   Financial resource strain: Not on file   Food insecurity    Worry: Not on file    Inability: Not on file   Transportation needs    Medical: Not on file    Non-medical: Not on file  Tobacco Use   Smoking status: Never Smoker   Smokeless tobacco: Never Used  Substance and Sexual Activity   Alcohol use: No   Drug use: No   Sexual activity: Not on file  Lifestyle   Physical activity    Days per week: Not on file    Minutes per session: Not on file   Stress: Not on file  Relationships   Social connections    Talks on phone: Not on file    Gets together: Not on file    Attends religious service: Not on file    Active member of club or organization: Not on file    Attends meetings of clubs or organizations: Not on file    Relationship status: Not on file   Intimate partner  violence    Fear of current or ex partner: Not on file    Emotionally abused: Not on file    Physically abused: Not on file    Forced sexual activity: Not on file  Other Topics Concern   Not on file  Social History Narrative   Not on file    FAMILY HISTORY: Family History  Problem Relation Age of Onset   Breast cancer Mother    Asthma Mother    Congestive Heart Failure Mother    Prostate cancer Father    Brain cancer Father    Bladder Cancer Father     ALLERGIES:  has No Known Allergies.  MEDICATIONS:  Current Outpatient Medications  Medication Sig Dispense Refill   amLODipine (NORVASC) 5 MG tablet Take 5 mg by mouth daily.     dexamethasone (DECADRON) 4 MG tablet Take 2 tablets (8 mg total) by mouth daily. Start the day after carboplatin chemotherapy for 3 days. 30 tablet 1   lidocaine-prilocaine (EMLA) cream Apply to affected area once 30 g 3   Multiple Vitamins-Minerals (MULTIVITAMIN WITH MINERALS) tablet Take 1 tablet by  mouth daily.     omeprazole (PRILOSEC) 20 MG capsule Take 1 capsule (20 mg total) by mouth daily. 30 capsule 1   ondansetron (ZOFRAN) 8 MG tablet Take 1 tablet (8 mg total) by mouth 2 (two) times daily as needed for refractory nausea / vomiting. Start on day 3 after carboplatin chemo. 30 tablet 1   oxyCODONE (ROXICODONE) 5 MG immediate release tablet Take 1 tablet (5 mg total) by mouth every 12 (twelve) hours as needed for moderate pain, severe pain or breakthrough pain. 30 tablet 0   prochlorperazine (COMPAZINE) 10 MG tablet Take 1 tablet (10 mg total) by mouth every 6 (six) hours as needed (Nausea or vomiting). 30 tablet 1   rosuvastatin (CRESTOR) 10 MG tablet Take 10 mg by mouth every evening.  4   No current facility-administered medications for this visit.      PHYSICAL EXAMINATION: ECOG PERFORMANCE STATUS: 1 - Symptomatic but completely ambulatory Vitals:   11/10/19 0837  BP: 137/86  Pulse: 95  Temp: (!) 96.4 F (35.8 C)    SpO2: 100%   Physical Exam  Constitutional: He is oriented to person, place, and time and well-developed, well-nourished, and in no distress. No distress.  HENT:  Head: Normocephalic and atraumatic.  Nose: Nose normal.  Mouth/Throat: Oropharynx is clear and moist. No oropharyngeal exudate.  No thrush Dry oral mucosa  Eyes: Pupils are equal, round, and reactive to light. Conjunctivae and EOM are normal. Left eye exhibits no discharge. No scleral icterus.  Neck: Normal range of motion. Neck supple. No thyromegaly present.  Cardiovascular: Normal rate, regular rhythm and normal heart sounds. Exam reveals no gallop and no friction rub.  No murmur heard. Pulmonary/Chest: Effort normal and breath sounds normal. No respiratory distress. He has no wheezes. He has no rales. He exhibits no tenderness.  Abdominal: Soft. Bowel sounds are normal. He exhibits no distension. There is no abdominal tenderness. There is no rebound.  Musculoskeletal: Normal range of motion.        General: No tenderness, deformity or edema.  Lymphadenopathy:    He has no cervical adenopathy.  Neurological: He is alert and oriented to person, place, and time. No cranial nerve deficit. He exhibits normal muscle tone. Coordination normal.  Skin: Skin is warm and dry. He is not diaphoretic. No erythema.  Psychiatric: Affect normal.     LABORATORY DATA:  I have reviewed the data as listed Lab Results  Component Value Date   WBC 5.7 10/27/2019   HGB 12.6 (L) 10/27/2019   HCT 38.7 (L) 10/27/2019   MCV 92.8 10/27/2019   PLT 236 10/27/2019   Recent Labs    10/09/19 1128 10/13/19 1034 10/20/19 0814  NA 135 138 137  K 4.1 4.2 4.2  CL 103 105 107  CO2 '25 26 24  ' GLUCOSE 129* 119* 95  BUN 24* 28* 31*  CREATININE 1.18 1.42* 1.33*  CALCIUM 8.7* 9.0 9.4  GFRNONAA >60 52* 56*  GFRAA >60 >60 >60  PROT 6.2* 6.8 7.0  ALBUMIN 3.4* 3.4* 3.7  AST '18 17 21  ' ALT '20 20 22  ' ALKPHOS 71 75 77  BILITOT 0.5 0.8 0.6   RADIOGRAPHIC STUDIES: I have personally reviewed the radiological images as listed and agreed with the findings in the report. Nm Pet Image Initial (pi) Skull Base To Thigh  Result Date: 08/20/2019 CLINICAL DATA:  Subsequent treatment strategy for throat cancer. EXAM: NUCLEAR MEDICINE PET SKULL BASE TO THIGH TECHNIQUE: 9.8 mCi F-18 FDG was injected intravenously.  Full-ring PET imaging was performed from the skull base to thigh after the radiotracer. CT data was obtained and used for attenuation correction and anatomic localization. Fasting blood glucose: 90 mg/dl COMPARISON:  07/02/2018 and CT neck 11/15/2017. FINDINGS: Mediastinal blood pool activity: SUV max 2.7 Liver activity: SUV max NA NECK: Right level-II lymph node measures 8 mm (3/63) with an SUV max of 5.8. There is asymmetric hypermetabolism within the left vocal cords, SUV max 13.8. No additional abnormal hypermetabolism in the neck. Incidental CT findings: None. CHEST: There are 2 hypermetabolic pathologically enlarged lymph nodes in the superior mediastinum. Index high right paratracheal lymph node measures 2.5 cm with an SUV max of 16.0. A soft tissue nodule or lymph node medial to the proximal right clavicle measures 10 mm in short axis (3/79) with an SUV max of 5.4. No additional hypermetabolic hilar or axillary lymph nodes. A pulmonary nodule along the anterior margin of minor fissure measures 1.4 cm (3/113) with an SUV max of 9.1. Incidental CT findings: Atherosclerotic calcification of the aorta, aortic valve and coronary arteries. No pericardial or pleural effusion. ABDOMEN/PELVIS: No abnormal hypermetabolism in the liver, adrenal glands, spleen or pancreas. No hypermetabolic lymph nodes. Incidental CT findings: Low-attenuation lesion in the left hepatic lobe measures 1.5 cm, likely a cyst. There may be sludge in the gallbladder. Adrenal glands are unremarkable. Low-attenuation lesion in the upper pole right kidney measures 4.9 cm and is  likely a cyst. A 1 mm stone is seen in the proximal left ureter (3/183), without hydronephrosis. There may be renal sinus cysts on the left. Spleen, pancreas, stomach and bowel are unremarkable. Bladder is low in volume. Prostate is mildly enlarged. Atherosclerotic calcification of the aorta. SKELETON: No abnormal osseous hypermetabolism. Incidental CT findings: None. IMPRESSION: 1. Asymmetric hypermetabolism involving the left vocal cords with hypermetabolic metastatic adenopathy in the neck and chest. Hypermetabolic pulmonary nodule along the minor fissure is also felt to be metastatic. 2. Tiny proximal left ureteral stone without hydronephrosis. 3. Possible gallbladder sludge. 4. Enlarged prostate. 5. Aortic atherosclerosis (ICD10-170.0). Coronary artery calcification. Electronically Signed   By: Lorin Picket M.D.   On: 08/20/2019 14:32   [place] Fna Soft Tissue  Result Date: 09/08/2019 INDICATION: History of squamous cell carcinoma of the throat with PET avid right supraclavicular lymph node. EXAM: ULTRASOUND-GUIDED FNA AND CORE BIOPSY OF A RIGHT SUPRACLAVICULAR LYMPH NODE MEDICATIONS: None. ANESTHESIA/SEDATION: None FLUOROSCOPY TIME:  Not applicable COMPLICATIONS: None immediate. PROCEDURE: Informed written consent was obtained from the patient after a thorough discussion of the procedural risks, benefits and alternatives. All questions were addressed. Maximal Sterile Barrier Technique was utilized including caps, mask, sterile gowns, sterile gloves, sterile drape, hand hygiene and skin antiseptic. A timeout was performed prior to the initiation of the procedure. The patient was then prepped and draped in the sterile manner with chlorhexidine. Utilizing 1% xylocaine as local anesthetic and real-time ultrasound guidance a fine-needle aspiration was performed utilizing a 25 gauge needle. This sample was given to the cytotechnologist and deemed adequate. Subsequently multiple core biopsies were obtained of the  right supraclavicular lymph node as well as a hypoechoic area surrounding the lymph nodes suspicious for localized invasion. These cores were given to the cytotechnologist for pathologic evaluation. Patient tolerated the procedure well. Needle was removed and hemostasis obtained at the puncture site. Patient was then returned his room in satisfactory condition. IMPRESSION: Successful fine-needle aspiration and core biopsy of a right supraclavicular lymph node. Electronically Signed   By: Linus Mako.D.  On: 09/08/2019 12:09     ASSESSMENT & PLAN:   1. 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[place])   2. Neoplasm related pain   3. Mouth sore   4. 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 S/p 2 cycles Keytruda/carboplatin/5-FU day 1-4 treatments. Labs are reviewed and discussed with patient.\ Counts are acceptable to proceed with cycle 3 carboplatin/5-FU/keytruda treatments. -dose reduce 5-FU  Prophylaxis G-CSF was not approved by his insurance. His counts have been monitored weekly through cycle 1 and his ANC remained above 1. Did well on Cycle 2.  Continue monitor.  PET scan before next visit for evaluation of treatment response.  He also desires a second opinion in the near future, will refer to [facility].   #Leukopenia, normal differential.  ANC 1.7, lymphocyte 0.6 Continue to monitor.  #mucositis , well controlled on oxycodone 5 mg every 12 hours as needed.  I sent him a refill of 30 tablets today. Dose reduce 5-FU today.  #CKD, avoid nephrotoxins.  Monitor.  Creatinine 1.43, follow-up with nephrologist  All questions were answered. The patient knows to call the clinic with any problems questions or concerns.  Follow up in 3 weeks.  Orders Placed This Encounter  Procedures   NM PET Image Restag (PS) Skull Base To Thigh    Standing Status:   Future    Standing Expiration Date:    11/09/2020    Order Specific Question:   If indicated for the ordered procedure, I authorize the administration of a radiopharmaceutical per Radiology protocol    Answer:   Yes    Order Specific Question:   Preferred imaging location?    Answer:   [facility]    Order Specific Question:   Radiology Contrast Protocol - do NOT remove file path    Answer:   [email or web address]    Earlie Server, MD, PhD Hematology Oncology [facility] at [facility] Pager- [phone] 11/10/2019

## 2019-11-10 NOTE — Progress Notes (Signed)
Patient here for follow up. Patient started on omeprazole and it has been working well for his acid reflux.

## 2019-11-14 ENCOUNTER — Inpatient Hospital Stay: Payer: PRIVATE HEALTH INSURANCE

## 2019-11-14 ENCOUNTER — Other Ambulatory Visit: Payer: Self-pay

## 2019-11-14 VITALS — BP 107/69 | HR 102 | Temp 98.7°F | Resp 18

## 2019-11-14 DIAGNOSIS — Z5111 Encounter for antineoplastic chemotherapy: Secondary | ICD-10-CM | POA: Diagnosis not present

## 2019-11-14 DIAGNOSIS — C109 Malignant neoplasm of oropharynx, unspecified: Secondary | ICD-10-CM

## 2019-11-14 MED ORDER — SODIUM CHLORIDE 0.9% FLUSH
10.0000 mL | INTRAVENOUS | Status: DC | PRN
  Administered 2019-11-14: 10 mL
  Filled 2019-11-14: qty 10

## 2019-11-14 MED ORDER — HEPARIN SOD (PORK) LOCK FLUSH 100 UNIT/ML IV SOLN
500.0000 [IU] | Freq: Once | INTRAVENOUS | Status: AC | PRN
  Administered 2019-11-14: 500 [IU]
  Filled 2019-11-14: qty 5

## 2019-11-17 ENCOUNTER — Other Ambulatory Visit: Payer: Self-pay | Admitting: Oncology

## 2019-11-20 ENCOUNTER — Telehealth: Payer: Self-pay | Admitting: *Deleted

## 2019-11-20 NOTE — Telephone Encounter (Signed)
Per [name] 11/20/19 staff message to cancel PET due to Pending clinical review. Called pt to make him aware that his scheduled 11/24/19 PET has been cancelled.  Pt is aware.Ivan Garcia

## 2019-11-25 ENCOUNTER — Telehealth: Payer: Self-pay | Admitting: *Deleted

## 2019-11-25 NOTE — Telephone Encounter (Signed)
Called pt and made him aware of his 12/02/19 scheduled PET scan Pt is aware of date, time and location.

## 2019-11-27 ENCOUNTER — Telehealth: Payer: Self-pay | Admitting: *Deleted

## 2019-11-27 NOTE — Telephone Encounter (Signed)
We got release here in [place] office , I faxed it to [facility] on 11/21/19

## 2019-12-01 ENCOUNTER — Inpatient Hospital Stay: Payer: PRIVATE HEALTH INSURANCE

## 2019-12-01 ENCOUNTER — Encounter: Payer: Self-pay | Admitting: Oncology

## 2019-12-01 ENCOUNTER — Inpatient Hospital Stay (HOSPITAL_BASED_OUTPATIENT_CLINIC_OR_DEPARTMENT_OTHER): Payer: PRIVATE HEALTH INSURANCE | Admitting: Oncology

## 2019-12-01 ENCOUNTER — Other Ambulatory Visit: Payer: Self-pay

## 2019-12-01 VITALS — HR 89

## 2019-12-01 VITALS — BP 130/83 | HR 103 | Temp 96.9°F | Resp 18 | Wt 186.8 lb

## 2019-12-01 DIAGNOSIS — C109 Malignant neoplasm of oropharynx, unspecified: Secondary | ICD-10-CM

## 2019-12-01 DIAGNOSIS — Z5111 Encounter for antineoplastic chemotherapy: Secondary | ICD-10-CM | POA: Diagnosis not present

## 2019-12-01 DIAGNOSIS — K1379 Other lesions of oral mucosa: Secondary | ICD-10-CM

## 2019-12-01 DIAGNOSIS — Z95828 Presence of other vascular implants and grafts: Secondary | ICD-10-CM | POA: Diagnosis not present

## 2019-12-01 DIAGNOSIS — N1831 Chronic kidney disease, stage 3a: Secondary | ICD-10-CM

## 2019-12-01 LAB — CBC WITH DIFFERENTIAL/PLATELET
Abs Immature Granulocytes: 0.03 10*3/uL (ref 0.00–0.07)
Basophils Absolute: 0 10*3/uL (ref 0.0–0.1)
Basophils Relative: 1 %
Eosinophils Absolute: 0.1 10*3/uL (ref 0.0–0.5)
Eosinophils Relative: 5 %
HCT: 39.4 % (ref 39.0–52.0)
Hemoglobin: 12.9 g/dL — ABNORMAL LOW (ref 13.0–17.0)
Immature Granulocytes: 1 %
Lymphocytes Relative: 19 %
Lymphs Abs: 0.5 10*3/uL — ABNORMAL LOW (ref 0.7–4.0)
MCH: 31.3 pg (ref 26.0–34.0)
MCHC: 32.7 g/dL (ref 30.0–36.0)
MCV: 95.6 fL (ref 80.0–100.0)
Monocytes Absolute: 0.5 10*3/uL (ref 0.1–1.0)
Monocytes Relative: 18 %
Neutro Abs: 1.6 10*3/uL — ABNORMAL LOW (ref 1.7–7.7)
Neutrophils Relative %: 56 %
Platelets: 248 10*3/uL (ref 150–400)
RBC: 4.12 MIL/uL — ABNORMAL LOW (ref 4.22–5.81)
RDW: 17 % — ABNORMAL HIGH (ref 11.5–15.5)
WBC: 2.8 10*3/uL — ABNORMAL LOW (ref 4.0–10.5)
nRBC: 0 % (ref 0.0–0.2)

## 2019-12-01 LAB — COMPREHENSIVE METABOLIC PANEL
ALT: 20 U/L (ref 0–44)
AST: 19 U/L (ref 15–41)
Albumin: 3.6 g/dL (ref 3.5–5.0)
Alkaline Phosphatase: 81 U/L (ref 38–126)
Anion gap: 7 (ref 5–15)
BUN: 30 mg/dL — ABNORMAL HIGH (ref 8–23)
CO2: 24 mmol/L (ref 22–32)
Calcium: 9.2 mg/dL (ref 8.9–10.3)
Chloride: 108 mmol/L (ref 98–111)
Creatinine, Ser: 1.38 mg/dL — ABNORMAL HIGH (ref 0.61–1.24)
GFR calc Af Amer: >60 mL/min (ref >60)
GFR calc non Af Amer: 54 mL/min — ABNORMAL LOW (ref >60)
Glucose, Bld: 104 mg/dL — ABNORMAL HIGH (ref 70–99)
Potassium: 4.2 mmol/L (ref 3.5–5.1)
Sodium: 139 mmol/L (ref 135–145)
Total Bilirubin: 0.5 mg/dL (ref 0.3–1.2)
Total Protein: 6.7 g/dL (ref 6.5–8.1)

## 2019-12-01 MED ORDER — SODIUM CHLORIDE 0.9 % IV SOLN
385.6500 mg | Freq: Once | INTRAVENOUS | Status: AC
  Administered 2019-12-01: 390 mg via INTRAVENOUS
  Filled 2019-12-01: qty 39

## 2019-12-01 MED ORDER — SODIUM CHLORIDE 0.9 % IV SOLN
200.0000 mg | Freq: Once | INTRAVENOUS | Status: AC
  Administered 2019-12-01: 200 mg via INTRAVENOUS
  Filled 2019-12-01: qty 8

## 2019-12-01 MED ORDER — SODIUM CHLORIDE 0.9 % IV SOLN
850.0000 mg/m2/d | INTRAVENOUS | Status: DC
  Administered 2019-12-01: 6750 mg via INTRAVENOUS
  Filled 2019-12-01: qty 100

## 2019-12-01 MED ORDER — SODIUM CHLORIDE 0.9 % IV SOLN
Freq: Once | INTRAVENOUS | Status: AC
  Filled 2019-12-01: qty 5

## 2019-12-01 MED ORDER — SODIUM CHLORIDE 0.9% FLUSH
10.0000 mL | Freq: Once | INTRAVENOUS | Status: AC
  Administered 2019-12-01: 09:00:00 10 mL via INTRAVENOUS
  Filled 2019-12-01: qty 10

## 2019-12-01 MED ORDER — PALONOSETRON HCL INJECTION 0.25 MG/5ML
0.2500 mg | Freq: Once | INTRAVENOUS | Status: AC
  Administered 2019-12-01: 0.25 mg via INTRAVENOUS
  Filled 2019-12-01: qty 5

## 2019-12-01 MED ORDER — SODIUM CHLORIDE 0.9 % IV SOLN
6750.0000 mg | INTRAVENOUS | Status: DC
  Administered 2019-12-01: 6750 mg via INTRAVENOUS
  Filled 2019-12-01: qty 135

## 2019-12-01 MED ORDER — SODIUM CHLORIDE 0.9 % IV SOLN
Freq: Once | INTRAVENOUS | Status: AC
  Filled 2019-12-01: qty 250

## 2019-12-01 NOTE — Addendum Note (Signed)
Addended by: Adelina Mings on: 12/01/2019 02:43 PM   Modules accepted: Orders

## 2019-12-01 NOTE — Progress Notes (Signed)
Patient here for follow up. Patient developd blisters to mouth and tongue after last 2 chemos. He states it was worse last time, it lasted about 2 weeks. No trouble chewing or swallowing. Patient states that there was a malfunction with his port. Per pt, "it gave some blood an then it stopped."

## 2019-12-01 NOTE — Progress Notes (Addendum)
Hematology/Oncology  Follow Up note [facility] Telephone:([phone]) [phone] Fax:(336) [phone]   Patient Care Team: Albina Billet, MD as PCP - General (Internal Medicine) Albina Billet, MD (Internal Medicine) Bary Castilla, Forest Gleason, MD (General Surgery) Noreene Filbert, MD as Referring Physician (Radiation Oncology) Earlie Server, MD as Consulting Physician (Oncology)  REFERRING PROVIDER: Dr.McQueen CHIEF COMPLAINTS/PURPOSE OF CONSULTATION:  Evaluation of newly diagnosed head and neck cancer.  HISTORY OF PRESENTING ILLNESS:  Ivan Garcia. is a  63 y.o.  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 P16 positive. cT2 cN2 disease, stage II, Concurrent ChemoRT  Cisplatin 100 mg/m2 q3weeks with concurrent RT which started on 12/31/2017.  S/p one dose of Cisplatin. Cisplatin discontinued due to nephrotoxicity. Switch to weekly Carboplatin (AUC 2) / Taxol 56m/m2 [ finished May 2019] Patient has finished concurrent chemoradiation in May 2019  # He reports doing well until in July 2020, he started to have hoarseness after singing for 2 to 3 hours. He has changed his insurance and Dr. MTami Ribasis no longer covered by his current insurance.  So he establish care with Dr.Alyshia Kernan KJuliann Pulseat DBanksand was seen on 08/04/2019. Flexible laryngoscopy showed Vocal cord paralysis.  Patient was recommended to have a PET scan done for restaging. Patient's primary care provider Dr. THall Busingordered a PET scan for patient which was done on 08/20/2019.  PET scan unfortunately showed metastatic disease.  Patient called cancer center and is scheduled follow-up with me. 08/20/2019 PET scan showed asymmetric hypermetabolic him involving the left vocal cords with hypermetabolic adenopathy in the neck, and the chest.  He also has hypermetabolic pulmonary nodule along the minor  fissure which is felt to be metastatic. Tiny proximal left ureteral stone without hydronephrosis.  Possible gallbladder sludge.  Enlarged prostate.  Atherosclerosis.  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  # NGS showed PD-L1 CPS 20, TMB indeterminant, MSI stable,PIK3CA mutation  INTERVAL HISTORY [name] is a 63y.o. male who presents for follow-up metastatic squamous cell carcinoma, P 16+.   Evaluation prior to chemotherapy. He is s/p 3 cycles of carboplatin, 5-FU and Keytruda. Patient tolerates with moderate difficulties.  Main concern is severe mouth pain/mucositis.Patient reports that he continues to have severe mucositis.  Symptoms last 1 to 2 weeks.  He does not feel any significant improvement after the dose reduced of 5-FU during third cycle He uses lidocaine mouth rinse that was prescribed by his dentist. He takes oxycodone 5 mg every 12 hours as needed for pain. No difficulty seeing chewing or swallowing.  His hoarseness has improved. Appetite is good.  He maintains good p.o. intake.  Has [place] weight [facility] [place] [facility] [place] Dr. CBarrington [place]     Review of Systems  Constitutional: Negative for chills, fever, malaise/fatigue and weight loss.  HENT: Negative for sore throat.         hoarseness has improved.  Mouth pain.   Eyes: Negative for redness.  Respiratory: Negative for cough, shortness of breath and wheezing.   Cardiovascular: Negative for chest pain, palpitations and leg swelling.  Gastrointestinal: Negative for abdominal pain, blood in stool, nausea and vomiting.  Genitourinary: Negative for dysuria.  Musculoskeletal: Negative for myalgias.  Skin: Negative for rash.  Neurological: Negative for dizziness, tingling and  tremors.  Endo/Heme/Allergies: Does not bruise/bleed easily.  Psychiatric/Behavioral: Negative for hallucinations.     MEDICAL HISTORY:  Past Medical History:  Diagnosis Date  . Arthritis   . Cancer ([place])    Head and neck cancer  . Hemorrhoids   . Hyperlipidemia   . Hypertension     SURGICAL HISTORY: Past Surgical History:  Procedure Laterality Date  . COLONOSCOPY WITH PROPOFOL N/A 04/04/2017   Procedure: COLONOSCOPY WITH PROPOFOL;  Surgeon: Robert Bellow, MD;  Location: [facility] END[facility]COPY;  Service: Endoscopy;  Laterality: N/A;  . MANDIBLE SURGERY    . PORT-A-CATH REMOVAL  07/17/2018  . PORTA CATH INSERTION N/A 12/19/2017   Procedure: PORTA CATH INSERTION;  Surgeon: Algernon Huxley, MD;  Location: [place] CV LAB;  Service: Cardiovascular;  Laterality: N/A;  . PORTA CATH INSERTION N/A 07/17/2018   Procedure: PORTA CATH INSERTION;  Surgeon: Algernon Huxley, MD;  Location: [place] [facility];  Service: Cardiovascular;  Laterality: N/A;  . PORTA CATH INSERTION N/A 09/18/2019   Procedure: PORTA CATH INSERTION;  Surgeon: Algernon Huxley, MD;  Location: [place] [facility] LAB;  Service: Cardiovascular;  Laterality: N/A;  . TONSILLECTOMY      SOCIAL HISTORY: Social History   Socioeconomic History  . Marital status: Single    Spouse name: Not on file  . Number of children: Not on file  . Years of education: Not on file  . Highest education level: Not on file  Occupational History  . Not on file  Tobacco Use  . Smoking status: Never Smoker  . Smokeless tobacco: Never Used  Substance and Sexual Activity  . Alcohol use: No  . Drug use: No  . Sexual activity: Not on file  Other Topics Concern  . Not on file  Social History Narrative  . Not on file   Social Determinants of Health   Financial Resource Strain:   . Difficulty of Paying Living Expenses: Not on file  Food Insecurity:   . Worried About Charity fundraiser in the Last Year: Not on file  . Ran Out of Food in the Last Year: Not on file  Transportation Needs:   . Lack of Transportation (Medical): Not on file  . Lack of Transportation  (Non-Medical): Not on file  Physical Activity:   . Days of Exercise per Week: Not on file  . Minutes of Exercise per Session: Not on file  Stress:   . Feeling of Stress : Not on file  Social Connections:   . Frequency of Communication with Friends and Family: Not on file  . Frequency of Social Gatherings with Friends and Family: Not on file  . Attends Religious Services: Not on file  . Active Member of Clubs or Organizations: Not on file  . Attends Archivist Meetings: Not on file  . Marital Status: Not on file  Intimate Partner Violence:   . Fear of Current or Ex-Partner: Not on file  . Emotionally Abused: Not on file  . Physically Abused: Not on file  . Sexually Abused: Not on file    FAMILY HISTORY: Family History  Problem Relation Age of Onset  . Breast cancer Mother   . Asthma Mother   . Congestive Heart Failure Mother   . Prostate cancer Father   . Brain cancer Father   . Bladder Cancer Father     ALLERGIES:  has No Known Allergies.  MEDICATIONS:  Current Outpatient Medications  Medication Sig Dispense Refill  . amLODipine (  NORVASC) 5 MG tablet Take 5 mg by mouth daily.    Marland Kitchen dexamethasone (DECADRON) 4 MG tablet Take 2 tablets (8 mg total) by mouth daily. Start the day after carboplatin chemotherapy for 3 days. 30 tablet 1  . lidocaine (XYLOCAINE) 2 % solution     . lidocaine-prilocaine (EMLA) cream Apply to affected area once 30 g 3  . Multiple Vitamins-Minerals (MULTIVITAMIN WITH MINERALS) tablet Take 1 tablet by mouth daily.    Marland Kitchen oxyCODONE (ROXICODONE) 5 MG immediate release tablet Take 1 tablet (5 mg total) by mouth every 12 (twelve) hours as needed for moderate pain, severe pain or breakthrough pain. 30 tablet 0  . rosuvastatin (CRESTOR) 10 MG tablet Take 10 mg by mouth every evening.  4  . omeprazole (PRILOSEC) 20 MG capsule TAKE 1 CAPSULE BY MOUTH EVERY DAY (Patient not taking: Reported on 12/01/2019) 90 capsule 1  . ondansetron (ZOFRAN) 8 MG tablet  Take 1 tablet (8 mg total) by mouth 2 (two) times daily as needed for refractory nausea / vomiting. Start on day 3 after carboplatin chemo. (Patient not taking: Reported on 12/01/2019) 30 tablet 1  . prochlorperazine (COMPAZINE) 10 MG tablet Take 1 tablet (10 mg total) by mouth every 6 (six) hours as needed (Nausea or vomiting). (Patient not taking: Reported on 12/01/2019) 30 tablet 1   No current facility-administered medications for this visit.   Facility-Administered Medications Ordered in Other Visits  Medication Dose Route Frequency Provider Last Rate Last Admin  . CARBOplatin (PARAPLATIN) 390 mg in sodium chloride 0.9 % 250 mL chemo infusion  390 mg Intravenous Once Earlie Server, MD      . fluorouracil (ADRUCIL) 6,750 mg in sodium chloride 0.9 % 115 mL chemo infusion  850 mg/m2/day (Treatment Plan Recorded) Intravenous 4 days Earlie Server, MD      . fosaprepitant (EMEND) 150 mg, dexamethasone (DECADRON) 12 mg in sodium chloride 0.9 % 145 mL IVPB   Intravenous Once Earlie Server, MD 454 mL/hr at 12/01/19 [place] at 12/01/19 1014  . pembrolizumab (KEYTRUDA) 200 mg in sodium chloride 0.9 % 50 mL chemo infusion  200 mg Intravenous Once Earlie Server, MD         PHYSICAL EXAMINATION: ECOG PERFORMANCE STATUS: 1 - Symptomatic but completely ambulatory Vitals:   12/01/19 0856  BP: 130/83  Pulse: (!) 103  Resp: 18  Temp: (!) 96.9 F (36.1 C)  SpO2: 99%   Physical Exam  Constitutional: He is oriented to person, place, and time and well-developed, well-nourished, and in no distress. No distress.  HENT:  Head: Normocephalic and atraumatic.  Mouth/Throat: No oropharyngeal exudate.  No thrush Dry oral mucosa  Eyes: Pupils are equal, round, and reactive to light. Conjunctivae and EOM are normal. Left eye exhibits no discharge. No scleral icterus.  Neck: No thyromegaly present.  Cardiovascular: Normal rate, regular rhythm and normal heart sounds. Exam reveals no gallop and no friction rub.  No murmur  heard. Pulmonary/Chest: Effort normal and breath sounds normal. No respiratory distress. He has no wheezes. He has no rales. He exhibits no tenderness.  Abdominal: Soft. Bowel sounds are normal. He exhibits no distension. There is no abdominal tenderness. There is no rebound.  Musculoskeletal:        General: No tenderness, deformity or edema. Normal range of motion.     Cervical back: Normal range of motion and neck supple.  Lymphadenopathy:    He has no cervical adenopathy.  Neurological: He is alert and oriented to person,  place, and time.  Skin: Skin is warm and dry. He is not diaphoretic. No erythema.  Psychiatric: Mood and affect normal.     LABORATORY DATA:  I have reviewed the data as listed Lab Results  Component Value Date   WBC 2.8 (L) 12/01/2019   HGB 12.9 (L) 12/01/2019   HCT 39.4 12/01/2019   MCV 95.6 12/01/2019   PLT 248 12/01/2019   Recent Labs    10/20/19 0814 11/10/19 0825 12/01/19 0849  NA 137 139 139  K 4.2 4.4 4.2  CL 107 109 108  CO2 '24 24 24  ' GLUCOSE 95 83 104*  BUN 31* 36* 30*  CREATININE 1.33* 1.43* 1.38*  CALCIUM 9.4 9.0 9.2  GFRNONAA 56* 51* 54*  GFRAA >60 60* >60  PROT 7.0 6.7 6.7  ALBUMIN 3.7 3.4* 3.6  AST '21 21 19  ' ALT '22 21 20  ' ALKPHOS 77 81 81  BILITOT 0.6 0.5 0.5  RADIOGRAPHIC STUDIES: I have personally reviewed the radiological images as listed and agreed with the findings in the report. PERIPHERAL VASCULAR CATHETERIZATION  Result Date: 09/18/2019 See op note  [place] FNA SOFT TISSUE  Result Date: 09/08/2019 INDICATION: History of squamous cell carcinoma of the throat with PET avid right supraclavicular lymph node. EXAM: ULTRASOUND-GUIDED FNA AND CORE BIOPSY OF A RIGHT SUPRACLAVICULAR LYMPH NODE MEDICATIONS: None. ANESTHESIA/SEDATION: None FLUOROSCOPY TIME:  Not applicable COMPLICATIONS: None immediate. PROCEDURE: Informed written consent was obtained from the patient after a thorough discussion of the procedural risks, benefits and  alternatives. All questions were addressed. Maximal Sterile Barrier Technique was utilized including caps, mask, sterile gowns, sterile gloves, sterile drape, hand hygiene and skin antiseptic. A timeout was performed prior to the initiation of the procedure. The patient was then prepped and draped in the sterile manner with chlorhexidine. Utilizing 1% xylocaine as local anesthetic and real-time ultrasound guidance a fine-needle aspiration was performed utilizing a 25 gauge needle. This sample was given to the cytotechnologist and deemed adequate. Subsequently multiple core biopsies were obtained of the right supraclavicular lymph node as well as a hypoechoic area surrounding the lymph nodes suspicious for localized invasion. These cores were given to the cytotechnologist for pathologic evaluation. Patient tolerated the procedure well. Needle was removed and hemostasis obtained at the puncture site. Patient was then returned his room in satisfactory condition. IMPRESSION: Successful fine-needle aspiration and core biopsy of a right supraclavicular lymph node. Electronically Signed   By: Inez Catalina M.D.   On: 09/08/2019 12:09     ASSESSMENT & PLAN:   1. 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[place])   2. Mouth sore   3. Encounter for antineoplastic chemotherapy   4. Port-A-Cath in place   5. 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 S/p 3 cycles Keytruda/carboplatin/5-FU day 1-4 treatments. 5-FU was dose reduced during his third cycle due to severe mucositis. Labs reviewed and discussed with patient. Counts acceptable to proceed with cycle 4 Keytruda/carboplatin/5-FU. I will further decrease carboplatin and 5-FU dose since he continues to suffer from mucositis. See below  #Mucositis, Patient reports that he continues to have severe mucositis.  Symptoms last 1 to 2 weeks. Patient stated he is not sure if  he would be able to continue with additional chemotherapy. I had a discussion with patient and he agrees to try 1 more cycles of chemotherapy with dose reduced carboplatin and 5-FU. Carboplatin will be reduced to 4.5 and 5-FU decreased to  850 mg/m.  Day 1 -4 Continue lidocaine mouth rinse prescribed by his dentist.  Continue oxycodone 5 mg every 12 hours as needed for pain.  I will schedule PET scan to be done prior to cycle 5 chemotherapy.  Depending on his treatment response will decide if he will go on maintenance therapy versus continue 2 more cycles of chemotherapy.  #Leukopenia, ANC 1.6.  Stable.  Continue to monitor.  Dose reduced chemotherapy  #Port-A-Cath in place,  He had some blood return from his port, not enough for blood work.  He was sent downstairs to get additional blood draw or for blood work. We will continue to monitor.  If it continues to be an issue for him, we will send him for dye study. #CKD, avoid nephrotoxins.  Monitor.  Creatinine 1.43, follow-up with nephrologist  All questions were answered. The patient knows to call the clinic with any problems questions or concerns.  Follow up in 3 weeks.   Earlie Server, MD, PhD Hematology Oncology [facility] at [facility] Pager- [phone] 12/01/2019

## 2019-12-01 NOTE — Progress Notes (Signed)
HR and labs ok per MD to proceed with tx today

## 2019-12-02 ENCOUNTER — Ambulatory Visit: Payer: PRIVATE HEALTH INSURANCE

## 2019-12-04 ENCOUNTER — Inpatient Hospital Stay: Payer: PRIVATE HEALTH INSURANCE

## 2019-12-04 ENCOUNTER — Other Ambulatory Visit: Payer: Self-pay

## 2019-12-04 DIAGNOSIS — Z5111 Encounter for antineoplastic chemotherapy: Secondary | ICD-10-CM | POA: Diagnosis not present

## 2019-12-04 DIAGNOSIS — C109 Malignant neoplasm of oropharynx, unspecified: Secondary | ICD-10-CM

## 2019-12-04 MED ORDER — HEPARIN SOD (PORK) LOCK FLUSH 100 UNIT/ML IV SOLN
INTRAVENOUS | Status: AC
  Filled 2019-12-04: qty 5

## 2019-12-04 MED ORDER — HEPARIN SOD (PORK) LOCK FLUSH 100 UNIT/ML IV SOLN
500.0000 [IU] | Freq: Once | INTRAVENOUS | Status: AC
  Administered 2019-12-04: 15:00:00 500 [IU] via INTRAVENOUS
  Filled 2019-12-04: qty 5

## 2019-12-04 MED ORDER — SODIUM CHLORIDE 0.9% FLUSH
10.0000 mL | Freq: Once | INTRAVENOUS | Status: AC
  Administered 2019-12-04: 14:00:00 10 mL via INTRAVENOUS
  Filled 2019-12-04: qty 10

## 2019-12-09 ENCOUNTER — Telehealth: Payer: Self-pay

## 2019-12-09 NOTE — Telephone Encounter (Signed)
Informed patient

## 2019-12-09 NOTE — Telephone Encounter (Signed)
Patient wants to know if he can take OTC Co-Q-10 while getting chemo?

## 2019-12-09 NOTE — Telephone Encounter (Signed)
CoQ10 is generally safe but not well studies in patients undergoing chemotherapy.

## 2019-12-18 ENCOUNTER — Encounter
Admission: RE | Admit: 2019-12-18 | Discharge: 2019-12-18 | Disposition: A | Discharge status: Home / Self Care | Payer: 59 | Source: Ambulatory Visit | Attending: Oncology | Admitting: Oncology

## 2019-12-18 ENCOUNTER — Other Ambulatory Visit: Payer: Self-pay

## 2019-12-18 ENCOUNTER — Other Ambulatory Visit: Payer: 59

## 2019-12-18 DIAGNOSIS — C109 Malignant neoplasm of oropharynx, unspecified: Secondary | ICD-10-CM | POA: Diagnosis present

## 2019-12-18 LAB — GLUCOSE, CAPILLARY: Glucose-Capillary: 78 mg/dL (ref 70–99)

## 2019-12-18 IMAGING — CT NM PET TUM IMG RESTAG (PS) SKULL BASE T - THIGH
1 of 10 series · 1 of 25 positions shown · non-contrast
Comparison: None.

CLINICAL DATA: Subsequent treatment strategy for head neck
carcinoma.

EXAM:
NUCLEAR MEDICINE PET SKULL BASE TO THIGH
TECHNIQUE: 9.14 mCi F-18 FDG was injected intravenously. Full-ring PET imaging
was performed from the skull base to thigh after the radiotracer. CT
data was obtained and used for attenuation correction and anatomic
localization.
Fasting blood glucose: 78 mg/dl

[Series 3: ct wb 5.0 b30f · axial · 5.0mm · 0.98mm/px · 1 of 329 slices shown]
[im 329/329  brain]
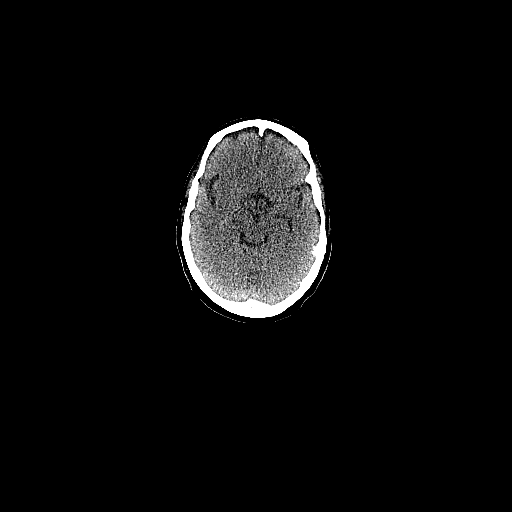

[1 of 25 positions shown; findings below may reference images not displayed]

FINDINGS: Mediastinal blood pool activity: SUV max

Liver activity: SUV max

NECK: Asymmetric activity in the glottis with decreased activity in
the RIGHT vocal cord.

Hypermetabolic lymph node in the RIGHT level III nodal location
adjacent to the thyroid cartilage with SUV max equal 4.9. This node
is poorly defined on CT measuring approximately 8 mm (image 55/3. no
change from prior.

Incidental CT findings: none

CHEST: Hypermetabolic lymph node anterior to the trachea in the
upper mediastinum with SUV max equal 7.5 decreased from SUV max
equal 16. This lymph node is smudgy measuring 12 mm (image 75/3)
and decreased significantly from 20 mm on prior.

Hypermetabolic high RIGHT paratracheal lymph node posterior to the
trachea with SUV max equal 4.3 decreased from SUV max equal 16 on
prior. This lymph node is poorly defined measuring approximately 9
mm with SUV decreased from 25 mm on prior.

Incidental CT findings: Within the RIGHT upper lobe round 15 mm
nodule (image 105/3) has intense metabolic activity SUV max equal
8.1. Previously nodule measures 14 mm SUV max equal 9.1 for no
significant change.

ABDOMEN/PELVIS: No abnormal hypermetabolic activity within the
liver, pancreas, adrenal glands, or spleen. No hypermetabolic lymph
nodes in the abdomen or pelvis.

Incidental CT findings: Atherosclerotic calcification of the aorta.

SKELETON: No focal hypermetabolic activity to suggest skeletal
metastasis.

Incidental CT findings: none
IMPRESSION: 1. Decrease in size and metabolic activity of previously large upper
mediastinal paratracheal lymph nodes. Lymph nodes have significant
residual metabolic activity.
2. No significant change in hypermetabolic RIGHT upper lobe
pulmonary nodule.
3. No change in small hypermetabolic RIGHT level 3 lymph node in the
neck.
4. No evidence of disease progression.

## 2019-12-18 MED ORDER — FLUDEOXYGLUCOSE F - 18 (FDG) INJECTION
9.6000 | Freq: Once | INTRAVENOUS | Status: AC | PRN
  Administered 2019-12-18: 9.14 via INTRAVENOUS

## 2019-12-19 ENCOUNTER — Other Ambulatory Visit: Payer: Self-pay

## 2019-12-19 NOTE — Progress Notes (Signed)
Patient pre screened for office appointment, no questions or concerns today. Patient reminded of upcoming appointment time and date. 

## 2019-12-22 ENCOUNTER — Other Ambulatory Visit: Payer: Self-pay

## 2019-12-22 ENCOUNTER — Inpatient Hospital Stay: Payer: 59

## 2019-12-22 ENCOUNTER — Inpatient Hospital Stay: Payer: 59 | Attending: Oncology | Admitting: Oncology

## 2019-12-22 ENCOUNTER — Encounter: Payer: Self-pay | Admitting: Oncology

## 2019-12-22 VITALS — BP 128/83 | HR 98 | Temp 97.3°F | Resp 18 | Wt 189.5 lb

## 2019-12-22 VITALS — BP 119/82 | HR 86

## 2019-12-22 DIAGNOSIS — Z95828 Presence of other vascular implants and grafts: Secondary | ICD-10-CM | POA: Diagnosis not present

## 2019-12-22 DIAGNOSIS — Z5189 Encounter for other specified aftercare: Secondary | ICD-10-CM | POA: Diagnosis not present

## 2019-12-22 DIAGNOSIS — C109 Malignant neoplasm of oropharynx, unspecified: Secondary | ICD-10-CM

## 2019-12-22 DIAGNOSIS — Z5111 Encounter for antineoplastic chemotherapy: Secondary | ICD-10-CM | POA: Diagnosis not present

## 2019-12-22 DIAGNOSIS — Z5112 Encounter for antineoplastic immunotherapy: Secondary | ICD-10-CM | POA: Diagnosis present

## 2019-12-22 DIAGNOSIS — N1831 Chronic kidney disease, stage 3a: Secondary | ICD-10-CM | POA: Diagnosis not present

## 2019-12-22 DIAGNOSIS — B370 Candidal stomatitis: Secondary | ICD-10-CM

## 2019-12-22 LAB — COMPREHENSIVE METABOLIC PANEL
ALT: 18 U/L (ref 0–44)
AST: 18 U/L (ref 15–41)
Albumin: 3.8 g/dL (ref 3.5–5.0)
Alkaline Phosphatase: 91 U/L (ref 38–126)
Anion gap: 11 (ref 5–15)
BUN: 31 mg/dL — ABNORMAL HIGH (ref 8–23)
CO2: 23 mmol/L (ref 22–32)
Calcium: 9.2 mg/dL (ref 8.9–10.3)
Chloride: 106 mmol/L (ref 98–111)
Creatinine, Ser: 1.37 mg/dL — ABNORMAL HIGH (ref 0.61–1.24)
GFR calc Af Amer: >60 mL/min (ref >60)
GFR calc non Af Amer: 54 mL/min — ABNORMAL LOW (ref >60)
Glucose, Bld: 118 mg/dL — ABNORMAL HIGH (ref 70–99)
Potassium: 4 mmol/L (ref 3.5–5.1)
Sodium: 140 mmol/L (ref 135–145)
Total Bilirubin: 0.6 mg/dL (ref 0.3–1.2)
Total Protein: 7 g/dL (ref 6.5–8.1)

## 2019-12-22 LAB — CBC WITH DIFFERENTIAL/PLATELET
Abs Immature Granulocytes: 0.04 10*3/uL (ref 0.00–0.07)
Basophils Absolute: 0 10*3/uL (ref 0.0–0.1)
Basophils Relative: 1 %
Eosinophils Absolute: 0.1 10*3/uL (ref 0.0–0.5)
Eosinophils Relative: 4 %
HCT: 39.4 % (ref 39.0–52.0)
Hemoglobin: 13 g/dL (ref 13.0–17.0)
Immature Granulocytes: 2 %
Lymphocytes Relative: 22 %
Lymphs Abs: 0.5 10*3/uL — ABNORMAL LOW (ref 0.7–4.0)
MCH: 32.3 pg (ref 26.0–34.0)
MCHC: 33 g/dL (ref 30.0–36.0)
MCV: 97.8 fL (ref 80.0–100.0)
Monocytes Absolute: 0.5 10*3/uL (ref 0.1–1.0)
Monocytes Relative: 22 %
Neutro Abs: 1.2 10*3/uL — ABNORMAL LOW (ref 1.7–7.7)
Neutrophils Relative %: 49 %
Platelets: 206 10*3/uL (ref 150–400)
RBC: 4.03 MIL/uL — ABNORMAL LOW (ref 4.22–5.81)
RDW: 17.5 % — ABNORMAL HIGH (ref 11.5–15.5)
WBC: 2.4 10*3/uL — ABNORMAL LOW (ref 4.0–10.5)
nRBC: 0 % (ref 0.0–0.2)

## 2019-12-22 LAB — TSH: TSH: 4.692 u[IU]/mL — ABNORMAL HIGH (ref 0.350–4.500)

## 2019-12-22 MED ORDER — SODIUM CHLORIDE 0.9 % IV SOLN
850.0000 mg/m2/d | INTRAVENOUS | Status: DC
  Administered 2019-12-22: 6750 mg via INTRAVENOUS
  Filled 2019-12-22: qty 135

## 2019-12-22 MED ORDER — SODIUM CHLORIDE 0.9% FLUSH
10.0000 mL | Freq: Once | INTRAVENOUS | Status: AC
  Administered 2019-12-22: 09:00:00 10 mL via INTRAVENOUS
  Filled 2019-12-22: qty 10

## 2019-12-22 MED ORDER — SODIUM CHLORIDE 0.9 % IV SOLN
Freq: Once | INTRAVENOUS | Status: AC
  Filled 2019-12-22: qty 5

## 2019-12-22 MED ORDER — SODIUM CHLORIDE 0.9% FLUSH
10.0000 mL | INTRAVENOUS | Status: DC | PRN
  Filled 2019-12-22: qty 10

## 2019-12-22 MED ORDER — PALONOSETRON HCL INJECTION 0.25 MG/5ML
0.2500 mg | Freq: Once | INTRAVENOUS | Status: AC
  Administered 2019-12-22: 10:00:00 0.25 mg via INTRAVENOUS
  Filled 2019-12-22: qty 5

## 2019-12-22 MED ORDER — SODIUM CHLORIDE 0.9 % IV SOLN: 387.9000 mg | Freq: Once | INTRAVENOUS | Status: DC

## 2019-12-22 MED ORDER — HEPARIN SOD (PORK) LOCK FLUSH 100 UNIT/ML IV SOLN
500.0000 [IU] | Freq: Once | INTRAVENOUS | Status: DC
  Filled 2019-12-22: qty 5

## 2019-12-22 MED ORDER — SODIUM CHLORIDE 0.9 % IV SOLN
387.9000 mg | Freq: Once | INTRAVENOUS | Status: AC
  Administered 2019-12-22: 390 mg via INTRAVENOUS
  Filled 2019-12-22: qty 39

## 2019-12-22 MED ORDER — SODIUM CHLORIDE 0.9 % IV SOLN
Freq: Once | INTRAVENOUS | Status: AC
  Filled 2019-12-22: qty 250

## 2019-12-22 MED ORDER — HEPARIN SOD (PORK) LOCK FLUSH 100 UNIT/ML IV SOLN
500.0000 [IU] | Freq: Once | INTRAVENOUS | Status: DC | PRN
  Filled 2019-12-22: qty 5

## 2019-12-22 MED ORDER — NYSTATIN 100000 UNIT/ML MT SUSP: 5.0000 mL | Freq: Four times a day (QID) | OROMUCOSAL | 0 refills | Status: DC

## 2019-12-22 MED ORDER — SODIUM CHLORIDE 0.9 % IV SOLN
200.0000 mg | Freq: Once | INTRAVENOUS | Status: AC
  Administered 2019-12-22: 11:00:00 200 mg via INTRAVENOUS
  Filled 2019-12-22: qty 8

## 2019-12-22 NOTE — Progress Notes (Signed)
Patient here for follow up. Pt has no sores in mouth but is still having aches to jaw on both sides.

## 2019-12-22 NOTE — Progress Notes (Signed)
Hematology/Oncology  Follow Up note [facility] [phone] [phone]   Patient Care Team: Albina Billet, MD as PCP - General (Internal Medicine) Albina Billet, MD (Internal Medicine) Bary Castilla, Forest Gleason, MD (General Surgery) Noreene Filbert, MD as Referring Physician (Radiation Oncology) Earlie Server, MD as Consulting Physician (Oncology)  REFERRING PROVIDER: Dr.McQueen CHIEF COMPLAINTS/PURPOSE OF CONSULTATION:  Evaluation of newly diagnosed head and neck cancer.  HISTORY OF PRESENTING ILLNESS:  Ivan Vigen. is a  65 y.o.  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 P16 positive. cT2 cN2 disease, stage II, Concurrent ChemoRT  Cisplatin 100 mg/m2 q3weeks with concurrent RT which started on 12/31/2017.  S/p one dose of Cisplatin. Cisplatin discontinued due to nephrotoxicity. Switch to weekly Carboplatin (AUC 2) / Taxol '45mg'$ /m2 [ finished May 2019] Patient has finished concurrent chemoradiation in May 2019  # He reports doing well until in July 2020, he started to have hoarseness after singing for 2 to 3 hours. He has changed his insurance and Dr. Tami Ribas is no longer covered by his current insurance.  So he establish care with Dr.Jalisia Puchalski Juliann Pulse at [facility] and was seen on 08/04/2019. Flexible laryngoscopy showed Vocal cord paralysis.  Patient was recommended to have a PET scan done for restaging. Patient's primary care provider Dr. Hall Busing ordered a PET scan for patient which was done on 08/20/2019.  PET scan unfortunately showed metastatic disease.  Patient called cancer center and is scheduled follow-up with me. 08/20/2019 PET scan showed asymmetric hypermetabolic him involving the left vocal cords with hypermetabolic adenopathy in the neck, and the chest.  He also has hypermetabolic pulmonary nodule along the minor  fissure which is felt to be metastatic. Tiny proximal left ureteral stone without hydronephrosis.  Possible gallbladder sludge.  Enlarged prostate.  Atherosclerosis.  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  # NGS showed PD-L1 CPS 20, TMB indeterminant, MSI stable,PIK3CA mutation  INTERVAL HISTORY Ivan Mccluney Okey Zelek. is a 64 y.o. male who presents for follow-up metastatic squamous cell carcinoma, P 16+.   Evaluation prior to chemotherapy. He is s/p 4 cycles of carboplatin, 5-FU and Keytruda.  With dose reduction-cycle 2, cycle 3, cycle 4. Patient tolerated with moderate difficulties. He has severe mouth pain mucositis episodes. Weight is stable.   Review of Systems  Constitutional: Negative for chills, fever, malaise/fatigue and weight loss.  HENT: Negative for sore throat.         hoarseness has improved.  Mouth pain.   Eyes: Negative for redness.  Respiratory: Negative for cough, shortness of breath and wheezing.   Cardiovascular: Negative for chest pain, palpitations and leg swelling.  Gastrointestinal: Negative for abdominal pain, blood in stool, nausea and vomiting.  Genitourinary: Negative for dysuria.  Musculoskeletal: Negative for myalgias.  Skin: Negative for rash.  Neurological: Negative for dizziness, tingling and tremors.  Endo/Heme/Allergies: Does not bruise/bleed easily.  Psychiatric/Behavioral: Negative for hallucinations.    MEDICAL HISTORY:  Past Medical History:  Diagnosis Date  . Arthritis   . Cancer ([place])    Head and neck cancer  . Hemorrhoids   . Hyperlipidemia   . Hypertension     SURGICAL HISTORY: Past Surgical History:  Procedure Laterality Date  . COLONOSCOPY WITH PROPOFOL N/A 04/04/2017   Procedure: COLONOSCOPY WITH PROPOFOL;  Surgeon: Robert Bellow, MD;  Location: [facility] ENDOSCOPY;  Service: Endoscopy;  Laterality: N/A;  . MANDIBLE SURGERY    .  PORT-A-CATH REMOVAL  07/17/2018  . PORTA CATH INSERTION N/A 12/19/2017   Procedure: PORTA CATH INSERTION;  Surgeon: Algernon Huxley, MD;  Location: [place] [facility];  Service: Cardiovascular;  Laterality: N/A;  . PORTA CATH INSERTION N/A 07/17/2018   Procedure: PORTA CATH INSERTION;  Surgeon: Algernon Huxley, MD;  Location: [place] [facility];  Service: Cardiovascular;  Laterality: N/A;  . PORTA CATH INSERTION N/A 09/18/2019   Procedure: PORTA CATH INSERTION;  Surgeon: Algernon Huxley, MD;  Location: [place] [facility];  Service: Cardiovascular;  Laterality: N/A;  . TONSILLECTOMY      SOCIAL HISTORY: Social History   Socioeconomic History  . Marital status: Single    Spouse name: Not on file  . Number of children: Not on file  . Years of education: Not on file  . Highest education level: Not on file  Occupational History  . Not on file  Tobacco Use  . Smoking status: Never Smoker  . Smokeless tobacco: Never Used  Substance and Sexual Activity  . Alcohol use: No  . Drug use: No  . Sexual activity: Not on file  Other Topics Concern  . Not on file  Social History Narrative  . Not on file   Social Determinants of Health   Financial Resource Strain:   . Difficulty of Paying Living Expenses: Not on file  Food Insecurity:   . Worried About Charity fundraiser in the Last Year: Not on file  . Ran Out of Food in the Last Year: Not on file  Transportation Needs:   . Lack of Transportation (Medical): Not on file  . Lack of Transportation (Non-Medical): Not on file  Physical Activity:   . Days of Exercise per Week: Not on file  . Minutes of Exercise per Session: Not on file  Stress:   . Feeling of Stress : Not on file  Social Connections:   . Frequency of Communication with Friends and Family: Not on file  . Frequency of Social Gatherings with Friends and Family: Not on file  . Attends Religious Services: Not on file  . Active Member of Clubs or Organizations: Not on file  .  Attends Archivist Meetings: Not on file  . Marital Status: Not on file  Intimate Partner Violence:   . Fear of Current or Ex-Partner: Not on file  . Emotionally Abused: Not on file  . Physically Abused: Not on file  . Sexually Abused: Not on file    FAMILY HISTORY: Family History  Problem Relation Age of Onset  . Breast cancer Mother   . Asthma Mother   . Congestive Heart Failure Mother   . Prostate cancer Father   . Brain cancer Father   . Bladder Cancer Father     ALLERGIES:  has No Known Allergies.  MEDICATIONS:  Current Outpatient Medications  Medication Sig Dispense Refill  . amLODipine (NORVASC) 5 MG tablet Take 5 mg by mouth daily.    Marland Kitchen dexamethasone (DECADRON) 4 MG tablet Take 2 tablets (8 mg total) by mouth daily. Start the day after carboplatin chemotherapy for 3 days. 30 tablet 1  . lidocaine (XYLOCAINE) 2 % solution     . lidocaine-prilocaine (EMLA) cream Apply to affected area once 30 g 3  . Multiple Vitamins-Minerals (MULTIVITAMIN WITH MINERALS) tablet Take 1 tablet by mouth daily.    Marland Kitchen omeprazole (PRILOSEC)  20 MG capsule TAKE 1 CAPSULE BY MOUTH EVERY DAY 90 capsule 1  . ondansetron (ZOFRAN) 8 MG tablet Take 1 tablet (8 mg total) by mouth 2 (two) times daily as needed for refractory nausea / vomiting. Start on day 3 after carboplatin chemo. 30 tablet 1  . oxyCODONE (ROXICODONE) 5 MG immediate release tablet Take 1 tablet (5 mg total) by mouth every 12 (twelve) hours as needed for moderate pain, severe pain or breakthrough pain. 30 tablet 0  . prochlorperazine (COMPAZINE) 10 MG tablet Take 1 tablet (10 mg total) by mouth every 6 (six) hours as needed (Nausea or vomiting). 30 tablet 1  . rosuvastatin (CRESTOR) 10 MG tablet Take 10 mg by mouth every evening.  4  . nystatin (MYCOSTATIN) 100000 UNIT/ML suspension Take 5 mLs (500,000 Units total) by mouth 4 (four) times daily. 473 mL 0   No current facility-administered medications for this visit.    Facility-Administered Medications Ordered in Other Visits  Medication Dose Route Frequency Provider Last Rate Last Admin  . CARBOplatin (PARAPLATIN) 390 mg in sodium chloride 0.9 % 250 mL chemo infusion  390 mg Intravenous Once Earlie Server, MD      . fluorouracil (ADRUCIL) 6,750 mg in sodium chloride 0.9 % 115 mL chemo infusion  850 mg/m2/day (Treatment Plan Recorded) Intravenous 4 days Earlie Server, MD      . fosaprepitant (EMEND) 150 mg, dexamethasone (DECADRON) 12 mg in sodium chloride 0.9 % 145 mL IVPB   Intravenous Once Earlie Server, MD      . heparin lock flush 100 unit/mL  500 Units Intravenous Once Earlie Server, MD      . heparin lock flush 100 unit/mL  500 Units Intracatheter Once PRN Earlie Server, MD      . palonosetron (ALOXI) injection 0.25 mg  0.25 mg Intravenous Once Earlie Server, MD      . pembrolizumab [facility]) 200 mg in sodium chloride 0.9 % 50 mL chemo infusion  200 mg Intravenous Once Earlie Server, MD      . sodium chloride flush (NS) 0.9 % injection 10 mL  10 mL Intracatheter PRN Earlie Server, MD         PHYSICAL EXAMINATION: ECOG PERFORMANCE STATUS: 1 - Symptomatic but completely ambulatory Vitals:   12/22/19 0912  BP: 128/83  Pulse: 98  Resp: 18  Temp: (!) 97.3 F (36.3 C)  SpO2: 98%   Physical Exam  Constitutional: He is oriented to person, place, and time and well-developed, well-nourished, and in no distress. No distress.  HENT:  Head: Normocephalic and atraumatic.  Mouth/Throat: No oropharyngeal exudate.  Thrush Dry oral mucosa  Eyes: Pupils are equal, round, and reactive to light. Conjunctivae and EOM are normal. Left eye exhibits no discharge. No scleral icterus.  Neck: No thyromegaly present.  Cardiovascular: Normal rate, regular rhythm and normal heart sounds. Exam reveals no gallop and no friction rub.  No murmur heard. Pulmonary/Chest: Effort normal and breath sounds normal. No respiratory distress. He has no wheezes. He has no rales. He exhibits no tenderness.  Abdominal:  Soft. Bowel sounds are normal. He exhibits no distension. There is no abdominal tenderness. There is no rebound.  Musculoskeletal:        General: No tenderness, deformity or edema. Normal range of motion.     Cervical back: Normal range of motion and neck supple.  Lymphadenopathy:    He has no cervical adenopathy.  Neurological: He is alert and oriented to person, place, and time.  Skin: Skin  is warm and dry. He is not diaphoretic. No erythema.  Psychiatric: Mood and affect normal.     LABORATORY DATA:  I have reviewed the data as listed Lab Results  Component Value Date   WBC 2.4 (L) 12/22/2019   HGB 13.0 12/22/2019   HCT 39.4 12/22/2019   MCV 97.8 12/22/2019   PLT 206 12/22/2019   Recent Labs    11/10/19 0825 12/01/19 0849 12/22/19 0840  NA 139 139 140  K 4.4 4.2 4.0  CL 109 108 106  CO2 _0 GLUCOSE 83 104* 118*  BUN 36* 30* 31*  CREATININE 1.43* 1.38* 1.37*  CALCIUM 9.0 9.2 9.2  GFRNONAA 51* 54* 54*  GFRAA 60* >60 >60  PROT 6.7 6.7 7.0  ALBUMIN 3.4* 3.6 3.8  AST _1 ALT _2 ALKPHOS 81 81 91  BILITOT 0.5 0.5 0.6  RADIOGRAPHIC STUDIES: I have personally reviewed the radiological images as listed and agreed with the findings in the report. NM PET Image Restag (PS) Skull Base To Thigh  Result Date: 12/18/2019 CLINICAL DATA:  Subsequent treatment strategy for head neck carcinoma. EXAM: NUCLEAR MEDICINE PET SKULL BASE TO THIGH TECHNIQUE: 9.14 mCi F-18 FDG was injected intravenously. Full-ring PET imaging was performed from the skull base to thigh after the radiotracer. CT data was obtained and used for attenuation correction and anatomic localization. Fasting blood glucose: 78 mg/dl COMPARISON:  None. FINDINGS: Mediastinal blood pool activity: SUV max 2.21 Liver activity: SUV max 3.5 NECK: Asymmetric activity in the glottis with decreased activity in the RIGHT vocal cord. Hypermetabolic lymph node in the RIGHT level III nodal location adjacent to the  thyroid cartilage with SUV max equal 4.9. This node is poorly defined on CT measuring approximately 8 mm (image 55/3. no change from prior. Incidental CT findings: none CHEST: Hypermetabolic lymph node anterior to the trachea in the upper mediastinum with SUV max equal 7.5 decreased from SUV max equal 16. This lymph node is smudgy measuring 12 mm (image 75/3) and decreased significantly from 20 mm on prior. Hypermetabolic high RIGHT paratracheal lymph node posterior to the trachea with SUV max equal 4.3 decreased from SUV max equal 16 on prior. This lymph node is poorly defined measuring approximately 9 mm with SUV decreased from 25 mm on prior. Incidental CT findings: Within the RIGHT upper lobe round 15 mm nodule (image 105/3) has intense metabolic activity SUV max equal 8.1. Previously nodule measures 14 mm SUV max equal 9.1 for no significant change. ABDOMEN/PELVIS: No abnormal hypermetabolic activity within the liver, pancreas, adrenal glands, or spleen. No hypermetabolic lymph nodes in the abdomen or pelvis. Incidental CT findings: Atherosclerotic calcification of the aorta. SKELETON: No focal hypermetabolic activity to suggest skeletal metastasis. Incidental CT findings: none IMPRESSION: 1. Decrease in size and metabolic activity of previously large upper mediastinal paratracheal lymph nodes. Lymph nodes have significant residual metabolic activity. 2. No significant change in hypermetabolic RIGHT upper lobe pulmonary nodule. 3. No change in small hypermetabolic RIGHT level 3 lymph node in the neck. 4. No evidence of disease progression. Electronically Signed   By: Suzy Bouchard M.D.   On: 12/18/2019 14:01     ASSESSMENT & PLAN:   1. 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[place])   2. Encounter for antineoplastic chemotherapy   3. Port-A-Cath in place   4. Stage 3a chronic kidney disease   5. 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 S/p 4 cycles Keytruda/carboplatin/5-FU day 1-4 treatments- Reduction due to severe mucositis. Labs are reviewed and discussed with patient. Interval PET scan images were reviewed and discussed with patient. Partial response: Decrease in size and metabolic activity of previous large upper mediastinal paratracheal lymph nodes.  Lymph node has significant residual metabolic activity.  No significant change in the hypermetabolic right upper lobe nodule and a small hypermetabolic right level 3 lymph node in the neck.  No evidence of disease progression. I discussed with patient about continuing 2 more cycles of chemo immunotherapy with current regimen of given the residual disease.  Repeat PET scan for reevaluation.  If good response, plan immunotherapy maintenance.  He agrees with the treatment plan.Marland Kitchen He also will contact [facility] and get a second opinion for discussion of his PET scan results   #Port-A-Cath, today he has sluggish blood return Monitor.  If continues to have port issue, will refer patient to catheter study  #Thrush, recommend patient to start nystatin oral rinse 4 times daily #Mucositis, continue use lidocaine mouth rinse prescribed by his dentist.  Oxycodone 5 mg every 12 hours as needed for pain.  #Leukopenia/Neutropenia, ANC 1.2  Recommend prophylactic growth factor with Onpro. Awaiting insurance coverage  #Port-A-Cath in place,  He had some blood return from his port, not enough for blood work.  He was sent downstairs to get additional blood draw or for blood work. We will continue to monitor.  If it continues to be an issue for him, we will send him for dye study. #CKD, avoid nephrotoxins.  Monitor.  Creatinine 1.37  All questions were answered. The patient knows to call the clinic with any problems questions or concerns.  Follow up in 3 weeks.   Earlie Server, MD, PhD Hematology Oncology [facility] Pager-  [phone] 12/22/2019

## 2019-12-24 ENCOUNTER — Telehealth: Payer: Self-pay | Admitting: *Deleted

## 2019-12-24 DIAGNOSIS — Z01812 Encounter for preprocedural laboratory examination: Secondary | ICD-10-CM

## 2019-12-24 DIAGNOSIS — Z5189 Encounter for other specified aftercare: Secondary | ICD-10-CM

## 2019-12-24 NOTE — Telephone Encounter (Signed)
Patient called and states he is supposed to have a dental cleaning 2/2 and is asking if that is ok to proceed with. Please advise

## 2019-12-24 NOTE — Telephone Encounter (Signed)
Need cbc w diff prior to dental cleaning. If ANC>1, ok for dental cleaning.

## 2019-12-25 NOTE — Telephone Encounter (Signed)
Patient accepts appointment for 2/1 for lab appt

## 2019-12-26 ENCOUNTER — Inpatient Hospital Stay: Payer: 59

## 2019-12-26 ENCOUNTER — Other Ambulatory Visit: Payer: Self-pay

## 2019-12-26 VITALS — BP 127/87 | HR 79 | Temp 97.3°F | Resp 18

## 2019-12-26 DIAGNOSIS — C109 Malignant neoplasm of oropharynx, unspecified: Secondary | ICD-10-CM

## 2019-12-26 DIAGNOSIS — Z5111 Encounter for antineoplastic chemotherapy: Secondary | ICD-10-CM | POA: Diagnosis not present

## 2019-12-26 MED ORDER — SODIUM CHLORIDE 0.9% FLUSH
10.0000 mL | INTRAVENOUS | Status: DC | PRN
  Administered 2019-12-26: 10 mL
  Filled 2019-12-26: qty 10

## 2019-12-26 MED ORDER — HEPARIN SOD (PORK) LOCK FLUSH 100 UNIT/ML IV SOLN
500.0000 [IU] | Freq: Once | INTRAVENOUS | Status: AC | PRN
  Administered 2019-12-26: 14:00:00 500 [IU]
  Filled 2019-12-26: qty 5

## 2019-12-26 MED ORDER — PEGFILGRASTIM 6 MG/0.6ML ~~LOC~~ PSKT
6.0000 mg | PREFILLED_SYRINGE | Freq: Once | SUBCUTANEOUS | Status: AC
  Administered 2019-12-26: 14:00:00 6 mg via SUBCUTANEOUS

## 2019-12-26 NOTE — Progress Notes (Signed)
Written and verbal Neulasta ON PRO instructions given, pt verbalizes understanding. Pt and VS stable at discharge.

## 2020-01-05 ENCOUNTER — Inpatient Hospital Stay: Payer: 59 | Attending: Oncology

## 2020-01-05 ENCOUNTER — Other Ambulatory Visit: Payer: Self-pay

## 2020-01-05 DIAGNOSIS — Z01812 Encounter for preprocedural laboratory examination: Secondary | ICD-10-CM

## 2020-01-05 DIAGNOSIS — D709 Neutropenia, unspecified: Secondary | ICD-10-CM | POA: Diagnosis not present

## 2020-01-05 DIAGNOSIS — Z5189 Encounter for other specified aftercare: Secondary | ICD-10-CM | POA: Diagnosis not present

## 2020-01-05 DIAGNOSIS — I129 Hypertensive chronic kidney disease with stage 1 through stage 4 chronic kidney disease, or unspecified chronic kidney disease: Secondary | ICD-10-CM | POA: Insufficient documentation

## 2020-01-05 DIAGNOSIS — C109 Malignant neoplasm of oropharynx, unspecified: Secondary | ICD-10-CM | POA: Insufficient documentation

## 2020-01-05 DIAGNOSIS — Z79899 Other long term (current) drug therapy: Secondary | ICD-10-CM | POA: Diagnosis not present

## 2020-01-05 DIAGNOSIS — K1231 Oral mucositis (ulcerative) due to antineoplastic therapy: Secondary | ICD-10-CM | POA: Diagnosis not present

## 2020-01-05 DIAGNOSIS — C7800 Secondary malignant neoplasm of unspecified lung: Secondary | ICD-10-CM | POA: Insufficient documentation

## 2020-01-05 DIAGNOSIS — N189 Chronic kidney disease, unspecified: Secondary | ICD-10-CM | POA: Insufficient documentation

## 2020-01-05 DIAGNOSIS — Z5111 Encounter for antineoplastic chemotherapy: Secondary | ICD-10-CM | POA: Diagnosis present

## 2020-01-05 DIAGNOSIS — C770 Secondary and unspecified malignant neoplasm of lymph nodes of head, face and neck: Secondary | ICD-10-CM | POA: Insufficient documentation

## 2020-01-05 DIAGNOSIS — D72819 Decreased white blood cell count, unspecified: Secondary | ICD-10-CM | POA: Insufficient documentation

## 2020-01-05 LAB — CBC WITH DIFFERENTIAL/PLATELET
Abs Immature Granulocytes: 0.46 10*3/uL — ABNORMAL HIGH (ref 0.00–0.07)
Basophils Absolute: 0.1 10*3/uL (ref 0.0–0.1)
Basophils Relative: 1 %
Eosinophils Absolute: 0.1 10*3/uL (ref 0.0–0.5)
Eosinophils Relative: 1 %
HCT: 35.7 % — ABNORMAL LOW (ref 39.0–52.0)
Hemoglobin: 11.8 g/dL — ABNORMAL LOW (ref 13.0–17.0)
Immature Granulocytes: 5 %
Lymphocytes Relative: 6 %
Lymphs Abs: 0.6 10*3/uL — ABNORMAL LOW (ref 0.7–4.0)
MCH: 33.1 pg (ref 26.0–34.0)
MCHC: 33.1 g/dL (ref 30.0–36.0)
MCV: 100 fL (ref 80.0–100.0)
Monocytes Absolute: 0.7 10*3/uL (ref 0.1–1.0)
Monocytes Relative: 8 %
Neutro Abs: 7.6 10*3/uL (ref 1.7–7.7)
Neutrophils Relative %: 79 %
Platelets: 114 10*3/uL — ABNORMAL LOW (ref 150–400)
RBC: 3.57 MIL/uL — ABNORMAL LOW (ref 4.22–5.81)
RDW: 16.7 % — ABNORMAL HIGH (ref 11.5–15.5)
WBC: 9.5 10*3/uL (ref 4.0–10.5)
nRBC: 0.2 % (ref 0.0–0.2)

## 2020-01-12 ENCOUNTER — Inpatient Hospital Stay: Payer: 59

## 2020-01-12 ENCOUNTER — Inpatient Hospital Stay (HOSPITAL_BASED_OUTPATIENT_CLINIC_OR_DEPARTMENT_OTHER): Payer: 59 | Admitting: Oncology

## 2020-01-12 ENCOUNTER — Encounter: Payer: Self-pay | Admitting: Oncology

## 2020-01-12 ENCOUNTER — Other Ambulatory Visit: Payer: Self-pay

## 2020-01-12 VITALS — BP 139/82 | HR 99 | Temp 97.6°F | Resp 18 | Wt 197.6 lb

## 2020-01-12 DIAGNOSIS — Z5111 Encounter for antineoplastic chemotherapy: Secondary | ICD-10-CM | POA: Diagnosis not present

## 2020-01-12 DIAGNOSIS — K1230 Oral mucositis (ulcerative), unspecified: Secondary | ICD-10-CM | POA: Diagnosis not present

## 2020-01-12 DIAGNOSIS — C109 Malignant neoplasm of oropharynx, unspecified: Secondary | ICD-10-CM

## 2020-01-12 LAB — COMPREHENSIVE METABOLIC PANEL
ALT: 20 U/L (ref 0–44)
AST: 19 U/L (ref 15–41)
Albumin: 3.5 g/dL (ref 3.5–5.0)
Alkaline Phosphatase: 101 U/L (ref 38–126)
Anion gap: 7 (ref 5–15)
BUN: 35 mg/dL — ABNORMAL HIGH (ref 8–23)
CO2: 23 mmol/L (ref 22–32)
Calcium: 9 mg/dL (ref 8.9–10.3)
Chloride: 107 mmol/L (ref 98–111)
Creatinine, Ser: 1.32 mg/dL — ABNORMAL HIGH (ref 0.61–1.24)
GFR calc Af Amer: >60 mL/min (ref >60)
GFR calc non Af Amer: 57 mL/min — ABNORMAL LOW (ref >60)
Glucose, Bld: 107 mg/dL — ABNORMAL HIGH (ref 70–99)
Potassium: 4.2 mmol/L (ref 3.5–5.1)
Sodium: 137 mmol/L (ref 135–145)
Total Bilirubin: 0.5 mg/dL (ref 0.3–1.2)
Total Protein: 6.7 g/dL (ref 6.5–8.1)

## 2020-01-12 LAB — CBC WITH DIFFERENTIAL/PLATELET
Abs Immature Granulocytes: 0.15 10*3/uL — ABNORMAL HIGH (ref 0.00–0.07)
Basophils Absolute: 0.1 10*3/uL (ref 0.0–0.1)
Basophils Relative: 1 %
Eosinophils Absolute: 0.1 10*3/uL (ref 0.0–0.5)
Eosinophils Relative: 2 %
HCT: 35.1 % — ABNORMAL LOW (ref 39.0–52.0)
Hemoglobin: 11.5 g/dL — ABNORMAL LOW (ref 13.0–17.0)
Immature Granulocytes: 2 %
Lymphocytes Relative: 9 %
Lymphs Abs: 0.6 10*3/uL — ABNORMAL LOW (ref 0.7–4.0)
MCH: 32.8 pg (ref 26.0–34.0)
MCHC: 32.8 g/dL (ref 30.0–36.0)
MCV: 100 fL (ref 80.0–100.0)
Monocytes Absolute: 0.5 10*3/uL (ref 0.1–1.0)
Monocytes Relative: 7 %
Neutro Abs: 5.2 10*3/uL (ref 1.7–7.7)
Neutrophils Relative %: 79 %
Platelets: 231 10*3/uL (ref 150–400)
RBC: 3.51 MIL/uL — ABNORMAL LOW (ref 4.22–5.81)
RDW: 16.1 % — ABNORMAL HIGH (ref 11.5–15.5)
WBC: 6.6 10*3/uL (ref 4.0–10.5)
nRBC: 0 % (ref 0.0–0.2)

## 2020-01-12 LAB — TSH: TSH: 4.356 u[IU]/mL (ref 0.350–4.500)

## 2020-01-12 MED ORDER — SODIUM CHLORIDE 0.9% FLUSH
10.0000 mL | Freq: Once | INTRAVENOUS | Status: AC
  Administered 2020-01-12: 10 mL via INTRAVENOUS
  Filled 2020-01-12: qty 10

## 2020-01-12 MED ORDER — SODIUM CHLORIDE 0.9 % IV SOLN
200.0000 mg | Freq: Once | INTRAVENOUS | Status: AC
  Administered 2020-01-12: 200 mg via INTRAVENOUS
  Filled 2020-01-12: qty 8

## 2020-01-12 MED ORDER — PALONOSETRON HCL INJECTION 0.25 MG/5ML
0.2500 mg | Freq: Once | INTRAVENOUS | Status: AC
  Administered 2020-01-12: 0.25 mg via INTRAVENOUS
  Filled 2020-01-12: qty 5

## 2020-01-12 MED ORDER — SODIUM CHLORIDE 0.9 % IV SOLN
850.0000 mg/m2/d | INTRAVENOUS | Status: DC
  Administered 2020-01-12: 6750 mg via INTRAVENOUS
  Filled 2020-01-12: qty 100

## 2020-01-12 MED ORDER — SODIUM CHLORIDE 0.9 % IV SOLN
Freq: Once | INTRAVENOUS | Status: AC
  Filled 2020-01-12: qty 5

## 2020-01-12 MED ORDER — SODIUM CHLORIDE 0.9 % IV SOLN
430.0000 mg | Freq: Once | INTRAVENOUS | Status: AC
  Administered 2020-01-12: 430 mg via INTRAVENOUS
  Filled 2020-01-12: qty 43

## 2020-01-12 MED ORDER — SODIUM CHLORIDE 0.9 % IV SOLN
Freq: Once | INTRAVENOUS | Status: AC
  Filled 2020-01-12: qty 250

## 2020-01-12 NOTE — Progress Notes (Signed)
Patient here for follow up. Had appointment at [facility] last week and got "a good report." Pt requesting refill on omeprazole.

## 2020-01-12 NOTE — Progress Notes (Signed)
Hematology/Oncology  Follow Up note [facility] Telephone:([phone]) [phone] Fax:(336) [phone]   Patient Care Team: Albina Billet, MD as PCP - General (Internal Medicine) Albina Billet, MD (Internal Medicine) Bary Castilla, Forest Gleason, MD (General Surgery) Noreene Filbert, MD as Referring Physician (Radiation Oncology) Earlie Server, MD as Consulting Physician (Oncology)  REFERRING PROVIDER: Dr.McQueen CHIEF COMPLAINTS/PURPOSE OF CONSULTATION:  Follow up for head and neck cancer.  HISTORY OF PRESENTING ILLNESS:  Ivan Garcia. is a  64 y.o.  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 P16 positive. cT2 cN2 disease, stage II, Concurrent ChemoRT  Cisplatin 100 mg/m2 q3weeks with concurrent RT which started on 12/31/2017.  S/p one dose of Cisplatin. Cisplatin discontinued due to nephrotoxicity. Switch to weekly Carboplatin (AUC 2) / Taxol 75m/m2 [ finished May 2019] Patient has finished concurrent chemoradiation in May 2019  # He reports doing well until in July 2020, he started to have hoarseness after singing for 2 to 3 hours. He has changed his insurance and Dr. MTami Ribasis no longer covered by his current insurance.  So he establish care with Dr.Henrietta Cieslewicz K[place]iann [place] was [place] on 08/04/2019. Flexible laryngoscopy showed Vocal cord paralysis.  Patient was recommended to have a PET scan done for restaging. Patient's primary care provider Dr. THall Busingordered a PET scan for patient which was done on 08/20/2019.  PET scan unfortunately showed metastatic disease.  Patient called cancer center and is scheduled follow-up with me. 08/20/2019 PET scan showed asymmetric hypermetabolic him involving the left vocal cords with hypermetabolic adenopathy in the neck, and the chest.  He also has hypermetabolic pulmonary nodule along the minor fissure which is felt  to be metastatic. Tiny proximal left ureteral stone without hydronephrosis.  Possible gallbladder sludge.  Enlarged prostate.  Atherosclerosis.  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  # NGS showed PD-L1 CPS 20, TMB indeterminant, MSI stable,PIK3CA mutation  INTERVAL HISTORY Ivan LudvigsenHDamion Garcia is a 64y.o. male who presents for follow-up metastatic squamous cell carcinoma, P 16+.   Evaluation prior to chemotherapy. He is s/p 4 cycles of carboplatin, 5-FU and Keytruda.  With dose reduction-cycle 2, cycle 3, cycle 4.  And cycle 5. Interim PET scan after 4 cycles of chemo and immunotherapy showed good response to the treatments and decision was made to continue another 2 cycles and repeat PET scan prior to starting immunotherapy maintenance. Patient reports feeling well. Mucositis, patient uses Magic mouthwash.  He also uses aloe vera juice and feels it also helps with the mouth pain. He has gained weight since last visit.  Appetite is good. During the interval, [facility]  [facility].  Review of Systems  Constitutional: Negative for chills, fever, malaise/fatigue and weight loss.  HENT: Negative for sore throat.         hoarseness has improved.  Mouth pain.   Eyes: Negative for redness.  Respiratory: Negative for cough, shortness of breath and wheezing.   Cardiovascular: Negative for chest pain, palpitations and leg swelling.  Gastrointestinal: Negative for abdominal pain, blood in stool, nausea and vomiting.  Genitourinary: Negative for dysuria.  Musculoskeletal: Negative for myalgias.  Skin: Negative for rash.  Neurological: Negative for dizziness, tingling and tremors.  Endo/Heme/Allergies: Does not bruise/bleed  easily.  Psychiatric/Behavioral: Negative for hallucinations.    MEDICAL HISTORY:  Past Medical History:    Diagnosis Date  . Arthritis   . Cancer ([place])    Head and neck cancer  . Hemorrhoids   . Hyperlipidemia   . Hypertension     SURGICAL HISTORY: Past Surgical History:  Procedure Laterality Date  . COLONOSCOPY WITH PROPOFOL N/A 04/04/2017   Procedure: COLONOSCOPY WITH PROPOFOL;  Surgeon: Robert Bellow, MD;  Location: [facility] ENDOSCOPY;  Service: Endoscopy;  Laterality: N/A;  . MANDIBLE SURGERY    . PORT-A-CATH REMOVAL  07/17/2018  . PORTA CATH INSERTION N/A 12/19/2017   Procedure: PORTA CATH INSERTION;  Surgeon: Algernon Huxley, MD;  Location: [place] CV LAB;  Service: Cardiovascular;  Laterality: N/A;  . PORTA CATH INSERTION N/A 07/17/2018   Procedure: PORTA CATH INSERTION;  Surgeon: Algernon Huxley, MD;  Location: [place] CV LAB;  Service: Cardiovascular;  Laterality: N/A;  . PORTA CATH INSERTION N/A 09/18/2019   Procedure: PORTA CATH INSERTION;  Surgeon: Algernon Huxley, MD;  Location: [place] CV LAB;  Service: Cardiovascular;  Laterality: N/A;  . TONSILLECTOMY      SOCIAL HISTORY: Social History   Socioeconomic History  . Marital status: Single    Spouse name: Not on file  . Number of children: Not on file  . Years of education: Not on file  . Highest education level: Not on file  Occupational History  . Not on file  Tobacco Use  . Smoking status: Never Smoker  . Smokeless tobacco: Never Used  Substance and Sexual Activity  . Alcohol use: No  . Drug use: No  . Sexual activity: Not on file  Other Topics Concern  . Not on file  Social History Narrative  . Not on file   Social Determinants of Health   Financial Resource Strain:   . Difficulty of Paying Living Expenses: Not on file  Food Insecurity:   . Worried About Charity fundraiser in the Last Year: Not on file  . Ran Out of Food in the Last Year: Not on file  Transportation Needs:   . Lack of Transportation (Medical): Not on file  . Lack of Transportation (Non-Medical): Not on file  Physical  Activity:   . Days of Exercise per Week: Not on file  . Minutes of Exercise per Session: Not on file  Stress:   . Feeling of Stress : Not on file  Social Connections:   . Frequency of Communication with Friends and Family: Not on file  . Frequency of Social Gatherings with Friends and Family: Not on file  . Attends Religious Services: Not on file  . Active Member of Clubs or Organizations: Not on file  . Attends Archivist Meetings: Not on file  . Marital Status: Not on file  Intimate Partner Violence:   . Fear of Current or Ex-Partner: Not on file  . Emotionally Abused: Not on file  . Physically Abused: Not on file  . Sexually Abused: Not on file    FAMILY HISTORY: Family History  Problem Relation Age of Onset  . Breast cancer Mother   . Asthma Mother   . Congestive Heart Failure Mother   . Prostate cancer Father   . Brain cancer Father   . Bladder Cancer Father     ALLERGIES:  has No Known Allergies.  MEDICATIONS:  Current Outpatient Medications  Medication Sig Dispense Refill  . amLODipine (NORVASC) 5 MG tablet Take  5 mg by mouth daily.    Marland Kitchen dexamethasone (DECADRON) 4 MG tablet Take 2 tablets (8 mg total) by mouth daily. Start the day after carboplatin chemotherapy for 3 days. 30 tablet 1  . lidocaine (XYLOCAINE) 2 % solution     . lidocaine-prilocaine (EMLA) cream Apply to affected area once 30 g 3  . Multiple Vitamins-Minerals (MULTIVITAMIN WITH MINERALS) tablet Take 1 tablet by mouth daily.    Marland Kitchen nystatin (MYCOSTATIN) 100000 UNIT/ML suspension Take 5 mLs (500,000 Units total) by mouth 4 (four) times daily. 473 mL 0  . omeprazole (PRILOSEC) 20 MG capsule TAKE 1 CAPSULE BY MOUTH EVERY DAY 90 capsule 1  . ondansetron (ZOFRAN) 8 MG tablet Take 1 tablet (8 mg total) by mouth 2 (two) times daily as needed for refractory nausea / vomiting. Start on day 3 after carboplatin chemo. 30 tablet 1  . oxyCODONE (ROXICODONE) 5 MG immediate release tablet Take 1 tablet (5 mg  total) by mouth every 12 (twelve) hours as needed for moderate pain, severe pain or breakthrough pain. 30 tablet 0  . prochlorperazine (COMPAZINE) 10 MG tablet Take 1 tablet (10 mg total) by mouth every 6 (six) hours as needed (Nausea or vomiting). 30 tablet 1  . rosuvastatin (CRESTOR) 10 MG tablet Take 10 mg by mouth every evening.  4   No current facility-administered medications for this visit.   Facility-Administered Medications Ordered in Other Visits  Medication Dose Route Frequency Provider Last Rate Last Admin  . CARBOplatin (PARAPLATIN) 430 mg in sodium chloride 0.9 % 250 mL chemo infusion  430 mg Intravenous Once Earlie Server, MD 293 mL/hr at 01/12/20 1055 430 mg at 01/12/20 1055  . fluorouracil (ADRUCIL) 6,750 mg in sodium chloride 0.9 % 115 mL chemo infusion  850 mg/m2/day (Treatment Plan Recorded) Intravenous 4 days Earlie Server, MD         PHYSICAL EXAMINATION: ECOG PERFORMANCE STATUS: 1 - Symptomatic but completely ambulatory Vitals:   01/12/20 0845  BP: 139/82  Pulse: 99  Resp: 18  Temp: 97.6 F (36.4 C)   Physical Exam  Constitutional: He is oriented to person, place, and time. No distress.  HENT:  Head: Normocephalic and atraumatic.  Mouth/Throat: No oropharyngeal exudate.  No thrush, dry oral mucosa  Eyes: Pupils are equal, round, and reactive to light. EOM are normal. No scleral icterus.  Cardiovascular: Normal rate and regular rhythm.  No murmur heard. Pulmonary/Chest: Effort normal. No respiratory distress. He has no rales. He exhibits no tenderness.  Abdominal: Soft. He exhibits no distension. There is no abdominal tenderness.  Musculoskeletal:        General: No edema. Normal range of motion.     Cervical back: Normal range of motion and neck supple.  Neurological: He is alert and oriented to person, place, and time. No cranial nerve deficit. He exhibits normal muscle tone. Coordination normal.  Skin: Skin is warm and dry. He is not diaphoretic. No erythema.    Psychiatric: Affect normal.     LABORATORY DATA:  I have reviewed the data as listed Lab Results  Component Value Date   WBC 6.6 01/12/2020   HGB 11.5 (L) 01/12/2020   HCT 35.1 (L) 01/12/2020   MCV 100.0 01/12/2020   PLT 231 01/12/2020   Recent Labs    12/01/19 0849 12/22/19 0840 01/12/20 0817  NA 139 140 137  K 4.2 4.0 4.2  CL 108 106 107  CO2 _0 GLUCOSE 104* 118* 107*  BUN 30* 31*  35*  CREATININE 1.38* 1.37* 1.32*  CALCIUM 9.2 9.2 9.0  GFRNONAA 54* 54* 57*  GFRAA >60 >60 >60  PROT 6.7 7.0 6.7  ALBUMIN 3.6 3.8 3.5  AST _0 ALT _1 ALKPHOS 81 91 101  BILITOT 0.5 0.6 0.5  RADIOGRAPHIC STUDIES: I have personally reviewed the radiological images as listed and agreed with the findings in the report. NM PET Image Restag (PS) Skull Base To Thigh  Result Date: 12/18/2019 CLINICAL DATA:  Subsequent treatment strategy for head neck carcinoma. EXAM: NUCLEAR MEDICINE PET SKULL BASE TO THIGH TECHNIQUE: 9.14 mCi F-18 FDG was injected intravenously. Full-ring PET imaging was performed from the skull base to thigh after the radiotracer. CT data was obtained and used for attenuation correction and anatomic localization. Fasting blood glucose: 78 mg/dl COMPARISON:  None. FINDINGS: Mediastinal blood pool activity: SUV max 2.21 Liver activity: SUV max 3.5 NECK: Asymmetric activity in the glottis with decreased activity in the RIGHT vocal cord. Hypermetabolic lymph node in the RIGHT level III nodal location adjacent to the thyroid cartilage with SUV max equal 4.9. This node is poorly defined on CT measuring approximately 8 mm (image 55/3. no change from prior. Incidental CT findings: none CHEST: Hypermetabolic lymph node anterior to the trachea in the upper mediastinum with SUV max equal 7.5 decreased from SUV max equal 16. This lymph node is smudgy measuring 12 mm (image 75/3) and decreased significantly from 20 mm on prior. Hypermetabolic high RIGHT paratracheal lymph node  posterior to the trachea with SUV max equal 4.3 decreased from SUV max equal 16 on prior. This lymph node is poorly defined measuring approximately 9 mm with SUV decreased from 25 mm on prior. Incidental CT findings: Within the RIGHT upper lobe round 15 mm nodule (image 105/3) has intense metabolic activity SUV max equal 8.1. Previously nodule measures 14 mm SUV max equal 9.1 for no significant change. ABDOMEN/PELVIS: No abnormal hypermetabolic activity within the liver, pancreas, adrenal glands, or spleen. No hypermetabolic lymph nodes in the abdomen or pelvis. Incidental CT findings: Atherosclerotic calcification of the aorta. SKELETON: No focal hypermetabolic activity to suggest skeletal metastasis. Incidental CT findings: none IMPRESSION: 1. Decrease in size and metabolic activity of previously large upper mediastinal paratracheal lymph nodes. Lymph nodes have significant residual metabolic activity. 2. No significant change in hypermetabolic RIGHT upper lobe pulmonary nodule. 3. No change in small hypermetabolic RIGHT level 3 lymph node in the neck. 4. No evidence of disease progression. Electronically Signed   By: Suzy Bouchard M.D.   On: 12/18/2019 14:01     ASSESSMENT & PLAN:   1. 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[place])   2. Encounter for antineoplastic chemotherapy   3. 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 S/p 5  cycles Keytruda/carboplatin/5-FU day 1-4 treatments- Patient tolerates the treatment with moderate difficulties due to mucositis. Labs reviewed and discussed with patient. Counts acceptable to proceed with cycle 6 chemo-immunotherapy. Recommend to repeat PET scan for reevaluation.  If he continues to have a good response and decreased residual disease, I will switch patient to immunotherapy maintenance with Keytruda. During the interval he was also seen by [facility] oncology for second opinion with  similar recommendations.  Discussed with patient .  #Thrush, resolved.  #Mucositis, continue use lidocaine mouth rinse prescribed by his dentist.  Oxycodone 5 mg every 12 hours as needed for pain.  #Leukopenia/Neutropenia, patient will receive prophylactic growth factor with Onpro. #  CKD, avoid nephrotoxins.  Monitor.  Creatinine 1.37.  All questions were answered. The patient knows to call the clinic with any problems questions or concerns. Orders Placed This Encounter  Procedures  . NM PET Image Restag (PS) Skull Base To Thigh    Standing Status:   Future    Standing Expiration Date:   01/11/2021    Order Specific Question:   ** REASON FOR EXAM (FREE TEXT)    Answer:   restaging head and neck cancer    Order Specific Question:   If indicated for the ordered procedure, I authorize the administration of a radiopharmaceutical per Radiology protocol    Answer:   Yes    Order Specific Question:   Radiology Contrast Protocol - do NOT remove file path    Answer:   _[email or web address]    Follow up in 3 weeks.  To discuss PET scan and evaluation prior to immunotherapy.  Earlie Server, MD, PhD Hematology Oncology [facility] Pager- [phone] 01/12/2020

## 2020-01-16 ENCOUNTER — Other Ambulatory Visit: Payer: Self-pay

## 2020-01-16 ENCOUNTER — Inpatient Hospital Stay: Payer: 59

## 2020-01-16 VITALS — BP 118/86 | HR 86 | Resp 20

## 2020-01-16 DIAGNOSIS — Z5111 Encounter for antineoplastic chemotherapy: Secondary | ICD-10-CM | POA: Diagnosis not present

## 2020-01-16 DIAGNOSIS — C109 Malignant neoplasm of oropharynx, unspecified: Secondary | ICD-10-CM

## 2020-01-16 MED ORDER — SODIUM CHLORIDE 0.9% FLUSH
10.0000 mL | INTRAVENOUS | Status: DC | PRN
  Administered 2020-01-16: 10 mL
  Filled 2020-01-16: qty 10

## 2020-01-16 MED ORDER — HEPARIN SOD (PORK) LOCK FLUSH 100 UNIT/ML IV SOLN
INTRAVENOUS | Status: AC
  Filled 2020-01-16: qty 5

## 2020-01-16 MED ORDER — PEGFILGRASTIM 6 MG/0.6ML ~~LOC~~ PSKT
6.0000 mg | PREFILLED_SYRINGE | Freq: Once | SUBCUTANEOUS | Status: AC
  Administered 2020-01-16: 6 mg via SUBCUTANEOUS
  Filled 2020-01-16: qty 0.6

## 2020-01-16 MED ORDER — HEPARIN SOD (PORK) LOCK FLUSH 100 UNIT/ML IV SOLN
500.0000 [IU] | Freq: Once | INTRAVENOUS | Status: AC | PRN
  Administered 2020-01-16: 500 [IU]
  Filled 2020-01-16: qty 5

## 2020-01-20 ENCOUNTER — Ambulatory Visit
Admission: RE | Admit: 2020-01-20 | Discharge: 2020-01-20 | Disposition: A | Discharge status: Home / Self Care | Payer: 59 | Source: Ambulatory Visit | Attending: Oncology | Admitting: Oncology

## 2020-01-20 ENCOUNTER — Other Ambulatory Visit: Payer: Self-pay

## 2020-01-20 DIAGNOSIS — E118 Type 2 diabetes mellitus with unspecified complications: Secondary | ICD-10-CM | POA: Diagnosis not present

## 2020-01-20 DIAGNOSIS — C109 Malignant neoplasm of oropharynx, unspecified: Secondary | ICD-10-CM | POA: Insufficient documentation

## 2020-01-20 DIAGNOSIS — R911 Solitary pulmonary nodule: Secondary | ICD-10-CM | POA: Insufficient documentation

## 2020-01-20 LAB — GLUCOSE, CAPILLARY: Glucose-Capillary: 90 mg/dL (ref 70–99)

## 2020-01-20 IMAGING — CT NM PET TUM IMG RESTAG (PS) SKULL BASE T - THIGH
1 of 10 series · 1 of 25 positions shown · non-contrast
Comparison: [DATE]

CLINICAL DATA: Subsequent treatment strategy for head neck cancer.

EXAM:
NUCLEAR MEDICINE PET SKULL BASE TO THIGH
TECHNIQUE: 10.5 mCi F-18 FDG was injected intravenously. Full-ring PET imaging
was performed from the skull base to thigh after the radiotracer. CT
data was obtained and used for attenuation correction and anatomic
localization.
Fasting blood glucose: 90 mg/dl

[Series 3: ct wb 5.0 b30f · axial · 5.0mm · 0.98mm/px · 1 of 290 slices shown]
[im 290/290  brain]
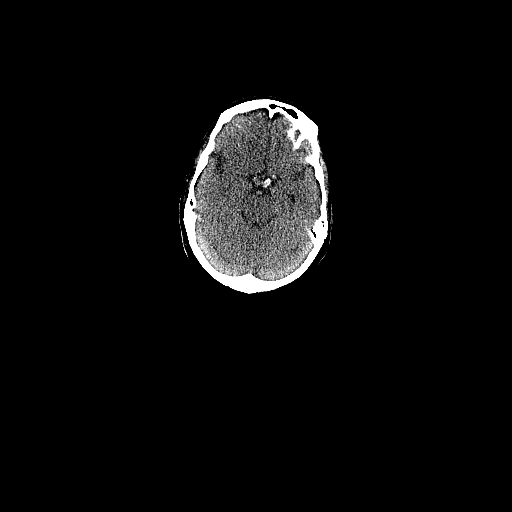

[1 of 25 positions shown; findings below may reference images not displayed]

FINDINGS: Mediastinal blood pool activity: SUV max

Liver activity: SUV max NA

NECK: Similar asymmetric uptake along the left focal cord.

Index hypermetabolic right level III lymph node identified on the
previous study shows stable to slight decrease in hypermetabolism
with SUV max = 3.2 today compared to 4.9 previously. This
ill-defined node is visible on 53/3 today with stable 8 mm short
axis measurement.

Incidental CT findings: none

CHEST: Previously described pretracheal node seen on image 71/3
today has decreased slightly in size in the interval measuring 10 mm
short axis today compared to 12 mm previously. SUV max = 4.1 today
compared to 7.5 previously.

Hypermetabolic high right paratracheal node identified previously is
stable in size at 9 mm (64/3) with SUV max = 2.8 today compared to
4.3 previously.

Right upper lobe pulmonary nodule has decreased in size in the
interval measuring 10 mm today compared to 15 mm previously. SUV max
= 2.5 today compared to 8.1 previously.

Incidental CT findings: Right Port-A-Cath tip is positioned at the
SVC/RA junction. Coronary artery calcification is evident. Thoracic
aortic atherosclerosis evident.

ABDOMEN/PELVIS: No abnormal hypermetabolic activity within the
liver, pancreas, adrenal glands, or spleen. No hypermetabolic lymph
nodes in the abdomen or pelvis.

Incidental CT findings: Cysts are again noted in the left liver and
right kidney. Diffuse diverticular disease noted in the colon
without evidence of diverticulitis. Abdominal aortic
atherosclerosis.

SKELETON: Diffuse FDG accumulation in the marrow space likely
related to stimulatory effects of therapy.

Incidental CT findings: No worrisome lytic or sclerotic osseous
abnormality.
IMPRESSION: 1. The small hypermetabolic right-sided level III cervical node
measures similar size today with slight decrease in hypermetabolism.
2. The previously identified pretracheal and high right paratracheal
hypermetabolic lymph nodes identified previously are stable to
decreased in size in the interval with both showing decreased
hypermetabolism.
3. Hypermetabolic right upper lobe pulmonary nodule is both
decreased in size and hypermetabolic FDG uptake.
4. No new sites of hypermetabolic soft tissue disease in the neck,
chest, abdomen, or pelvis to raise concern for disease progression.

## 2020-01-20 MED ORDER — FLUDEOXYGLUCOSE F - 18 (FDG) INJECTION
9.8000 | Freq: Once | INTRAVENOUS | Status: AC | PRN
  Administered 2020-01-20: 10:00:00 10.5 via INTRAVENOUS

## 2020-01-30 ENCOUNTER — Encounter: Payer: Self-pay | Admitting: Oncology

## 2020-01-30 NOTE — Progress Notes (Signed)
Patient contacted for follow up. States that he had been having mouth sores, but they are better now since he has been using mouth rinse.

## 2020-02-02 ENCOUNTER — Inpatient Hospital Stay (HOSPITAL_BASED_OUTPATIENT_CLINIC_OR_DEPARTMENT_OTHER): Payer: 59 | Admitting: Oncology

## 2020-02-02 ENCOUNTER — Other Ambulatory Visit: Payer: Self-pay

## 2020-02-02 ENCOUNTER — Inpatient Hospital Stay: Payer: 59 | Attending: Oncology

## 2020-02-02 ENCOUNTER — Inpatient Hospital Stay: Payer: 59

## 2020-02-02 VITALS — BP 128/83 | HR 98 | Temp 96.5°F | Wt 190.6 lb

## 2020-02-02 DIAGNOSIS — Z5112 Encounter for antineoplastic immunotherapy: Secondary | ICD-10-CM

## 2020-02-02 DIAGNOSIS — C109 Malignant neoplasm of oropharynx, unspecified: Secondary | ICD-10-CM

## 2020-02-02 DIAGNOSIS — N1831 Chronic kidney disease, stage 3a: Secondary | ICD-10-CM

## 2020-02-02 DIAGNOSIS — K1230 Oral mucositis (ulcerative), unspecified: Secondary | ICD-10-CM

## 2020-02-02 DIAGNOSIS — Z79899 Other long term (current) drug therapy: Secondary | ICD-10-CM | POA: Insufficient documentation

## 2020-02-02 DIAGNOSIS — Z95828 Presence of other vascular implants and grafts: Secondary | ICD-10-CM

## 2020-02-02 LAB — CBC WITH DIFFERENTIAL/PLATELET
Abs Immature Granulocytes: 0.04 10*3/uL (ref 0.00–0.07)
Basophils Absolute: 0 10*3/uL (ref 0.0–0.1)
Basophils Relative: 1 %
Eosinophils Absolute: 0.1 10*3/uL (ref 0.0–0.5)
Eosinophils Relative: 2 %
HCT: 35.9 % — ABNORMAL LOW (ref 39.0–52.0)
Hemoglobin: 11.8 g/dL — ABNORMAL LOW (ref 13.0–17.0)
Immature Granulocytes: 1 %
Lymphocytes Relative: 9 %
Lymphs Abs: 0.6 10*3/uL — ABNORMAL LOW (ref 0.7–4.0)
MCH: 33.8 pg (ref 26.0–34.0)
MCHC: 32.9 g/dL (ref 30.0–36.0)
MCV: 102.9 fL — ABNORMAL HIGH (ref 80.0–100.0)
Monocytes Absolute: 0.5 10*3/uL (ref 0.1–1.0)
Monocytes Relative: 7 %
Neutro Abs: 5.3 10*3/uL (ref 1.7–7.7)
Neutrophils Relative %: 80 %
Platelets: 186 10*3/uL (ref 150–400)
RBC: 3.49 MIL/uL — ABNORMAL LOW (ref 4.22–5.81)
RDW: 16.4 % — ABNORMAL HIGH (ref 11.5–15.5)
WBC: 6.6 10*3/uL (ref 4.0–10.5)
nRBC: 0 % (ref 0.0–0.2)

## 2020-02-02 LAB — COMPREHENSIVE METABOLIC PANEL
ALT: 18 U/L (ref 0–44)
AST: 17 U/L (ref 15–41)
Albumin: 3.7 g/dL (ref 3.5–5.0)
Alkaline Phosphatase: 100 U/L (ref 38–126)
Anion gap: 9 (ref 5–15)
BUN: 23 mg/dL (ref 8–23)
CO2: 23 mmol/L (ref 22–32)
Calcium: 9.3 mg/dL (ref 8.9–10.3)
Chloride: 104 mmol/L (ref 98–111)
Creatinine, Ser: 1.3 mg/dL — ABNORMAL HIGH (ref 0.61–1.24)
GFR calc Af Amer: >60 mL/min (ref >60)
GFR calc non Af Amer: 58 mL/min — ABNORMAL LOW (ref >60)
Glucose, Bld: 106 mg/dL — ABNORMAL HIGH (ref 70–99)
Potassium: 4 mmol/L (ref 3.5–5.1)
Sodium: 136 mmol/L (ref 135–145)
Total Bilirubin: 0.6 mg/dL (ref 0.3–1.2)
Total Protein: 6.8 g/dL (ref 6.5–8.1)

## 2020-02-02 MED ORDER — HEPARIN SOD (PORK) LOCK FLUSH 100 UNIT/ML IV SOLN
500.0000 [IU] | Freq: Once | INTRAVENOUS | Status: AC | PRN
  Administered 2020-02-02: 500 [IU]
  Filled 2020-02-02: qty 5

## 2020-02-02 MED ORDER — SODIUM CHLORIDE 0.9% FLUSH
10.0000 mL | Freq: Once | INTRAVENOUS | Status: AC
  Administered 2020-02-02: 10 mL via INTRAVENOUS
  Filled 2020-02-02: qty 10

## 2020-02-02 MED ORDER — SODIUM CHLORIDE 0.9 % IV SOLN
200.0000 mg | Freq: Once | INTRAVENOUS | Status: AC
  Administered 2020-02-02: 200 mg via INTRAVENOUS
  Filled 2020-02-02: qty 8

## 2020-02-02 MED ORDER — HEPARIN SOD (PORK) LOCK FLUSH 100 UNIT/ML IV SOLN
INTRAVENOUS | Status: AC
  Filled 2020-02-02: qty 5

## 2020-02-02 MED ORDER — SODIUM CHLORIDE 0.9 % IV SOLN
Freq: Once | INTRAVENOUS | Status: AC
  Filled 2020-02-02: qty 250

## 2020-02-02 NOTE — Progress Notes (Signed)
Hematology/Oncology  Follow Up note [facility] [phone] [phone]   Patient Care Team: Ivan Billet, MD as PCP - General (Internal Medicine) Ivan Billet, MD (Internal Medicine) Ivan Garcia, Ivan Gleason, MD (General Surgery) Ivan Filbert, MD as Referring Physician (Radiation Oncology) Ivan Server, MD as Consulting Physician (Oncology)  REFERRING PROVIDER: Dr.McQueen CHIEF COMPLAINTS/PURPOSE OF CONSULTATION:  Follow up for head and neck cancer.  HISTORY OF PRESENTING ILLNESS:  Ivan Garcia. is a  64 y.o.  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 P16 positive. cT2 cN2 disease, stage II, Concurrent ChemoRT  Cisplatin 100 mg/m2 q3weeks with concurrent RT which started on 12/31/2017.  S/p one dose of Cisplatin. Cisplatin discontinued due to nephrotoxicity. Switch to weekly Carboplatin (AUC 2) / Taxol 62m/m2 [ finished May 2019] Patient has finished concurrent chemoradiation in May 2019  # He reports doing well until in July 2020, he started to have hoarseness after singing for 2 to 3 hours. He has changed his insurance and Dr. MTami Garcia no longer covered by his current insurance.  So he establish care with [facility]n [facility] [place] was seen on 08/04/2019. Flexible laryngoscopy showed Vocal cord paralysis.  Patient was recommended to have a PET scan done for restaging. Patient's primary care provider Dr. THall Busingordered a PET scan for patient which was done on 08/20/2019.  PET scan unfortunately showed metastatic disease.  Patient called cancer center and is scheduled follow-up with me. 08/20/2019 PET scan showed asymmetric hypermetabolic him involving the left vocal cords with hypermetabolic adenopathy in the neck, and the chest.  He also has hypermetabolic pulmonary nodule along the minor fissure which is felt  to be metastatic. Tiny proximal left ureteral stone without hydronephrosis.  Possible gallbladder sludge.  Enlarged prostate.  Atherosclerosis.  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  # NGS showed PD-L1 CPS 20, TMB indeterminant, MSI stable,PIK3CA mutation  INTERVAL HISTORY Ivan Garcia is a 64y.o. male who presents for follow-up metastatic squamous cell carcinoma, P 16+.   Evaluation prior to chemotherapy. He is s/p 6 cycles of carboplatin, 5-FU and Keytruda.  Patient reports mild mucositis after last treatment.  Otherwise no new complaints.  He reports feeling well.  During the interval patient has had PET scan done prior to today's visit.  Hoarseness   Review of Systems  Constitutional: Negative for chills, fever, malaise/fatigue and weight loss.  HENT: Negative for sore throat.         hoarseness has improved.    Eyes: Negative for redness.  Respiratory: Negative for cough, shortness of breath and wheezing.   Cardiovascular: Negative for chest pain, palpitations and leg swelling.  Gastrointestinal: Negative for abdominal pain, blood in stool, nausea and vomiting.  Genitourinary: Negative for dysuria.  Musculoskeletal: Negative for myalgias.  Skin: Negative for rash.  Neurological: Negative for dizziness, tingling and tremors.  Endo/Heme/Allergies: Does not bruise/bleed easily.  Psychiatric/Behavioral: Negative for hallucinations.    MEDICAL HISTORY:  Past Medical History:  Diagnosis Date  . [place]thritis   [place] cancer  . Hemorrhoids   . Hyperlipidemia   . Hypertension     SURGICAL HISTORY: Past Surgical History:  Procedure Laterality Date  . COLONOSCOPY WITH PROPOFOL N/A 04/04/2017   Procedure: COLONOSCOPY WITH PROPOFOL;  Surgeon: Ivan W Byrnett, MD;  Location: [facility] ENDOSCOPY;  Service: Endoscopy;  Laterality: N/A;  . MANDIBLE SURGERY    .  PORT-A-CATH REMOVAL  07/17/2018  . PORTA CATH INSERTION N/A 12/19/2017   Procedure: PORTA CATH INSERTION;  Surgeon: Garcia, Ivan S, MD;  Location: [facility] INVASIVE CV LAB;  Service: Cardiovascular;  Laterality: N/A;  . PORTA CATH INSERTION N/A 07/17/2018   Procedure: PORTA CATH INSERTION;  Surgeon: Garcia, Ivan S, MD;  Location: [facility] INVASIVE CV LAB;  Service: Cardiovascular;  Laterality: N/A;  . PORTA CATH INSERTION N/A 09/18/2019   Procedure: PORTA CATH INSERTION;  Surgeon: Garcia, Ivan S, MD;  Location: [facility] INVASIVE CV LAB;  Service: Cardiovascular;  Laterality: N/A;  . TONSILLECTOMY      SOCIAL HISTORY: Social History   Socioeconomic History  . Marital status: Single    Spouse name: Not on file  . Number of children: Not on file  . Years of education: Not on file  . Highest education level: Not on file  Occupational History  . Not on file  Tobacco Use  . Smoking status: Never Smoker  . Smokeless tobacco: Never Used  Substance and Sexual Activity  . Alcohol use: No  . Drug use: No  . Sexual activity: Not on file  Other Topics Concern  . Not on file  Social History Narrative  . Not on file   Social Determinants of Health   Financial Resource Strain:   . Difficulty of Paying Living Expenses: Not on file  Food Insecurity:   . Worried About Running Out of Food in the Last Year: Not on file  . Ran Out of Food in the Last Year: Not on file  Transportation Needs:   . Lack of Transportation (Medical): Not on file  . Lack of Transportation (Non-Medical): Not on file  Physical Activity:   . Days of Exercise per Week: Not on file  . Minutes of Exercise per Session: Not on file  Stress:   . Feeling of Stress : Not on file  Social Connections:   . Frequency of Communication with Friends and Family: Not on file  . Frequency of Social Gatherings with Friends and Family: Not on file  . Attends Religious Services: Not on file  . Active Member of Clubs or Organizations: Not on file  .  Attends Club or Organization Meetings: Not on file  . Marital Status: Not on file  Intimate Partner Violence:   . Fear of Current or Ex-Partner: Not on file  . Emotionally Abused: Not on file  . Physically Abused: Not on file  . Sexually Abused: Not on file    FAMILY HISTORY: Family History  Problem Relation Age of Onset  . Breast cancer Mother   . Asthma Mother   . Congestive Heart Failure Mother   . Prostate cancer Father   . Brain cancer Father   . Bladder Cancer Father     ALLERGIES:  has No Known Allergies.  MEDICATIONS:  Current Outpatient Medications  Medication Sig Dispense Refill  . amLODipine (NORVASC) 5 MG tablet Take 5 mg by mouth daily.    . dexamethasone (DECADRON) 4 MG tablet Take 2 tablets (8 mg total) by mouth daily. Start the day after carboplatin chemotherapy for 3 days. 30 tablet 1  . lidocaine (XYLOCAINE) 2 % solution     . lidocaine-prilocaine (EMLA) cream Apply to affected area once 30 g 3  . Multiple Vitamins-Minerals (MULTIVITAMIN WITH MINERALS) tablet Take 1 tablet by mouth daily.    .   nystatin (MYCOSTATIN) 100000 UNIT/ML suspension Take 5 mLs (500,000 Units total) by mouth 4 (four) times daily. 473 mL 0  . omeprazole (PRILOSEC) 20 MG capsule TAKE 1 CAPSULE BY MOUTH EVERY DAY 90 capsule 1  . ondansetron (ZOFRAN) 8 MG tablet Take 1 tablet (8 mg total) by mouth 2 (two) times daily as needed for refractory nausea / vomiting. Start on day 3 after carboplatin chemo. 30 tablet 1  . oxyCODONE (ROXICODONE) 5 MG immediate release tablet Take 1 tablet (5 mg total) by mouth every 12 (twelve) hours as needed for moderate pain, severe pain or breakthrough pain. 30 tablet 0  . prochlorperazine (COMPAZINE) 10 MG tablet Take 1 tablet (10 mg total) by mouth every 6 (six) hours as needed (Nausea or vomiting). 30 tablet 1  . rosuvastatin (CRESTOR) 10 MG tablet Take 10 mg by mouth every evening.  4   No current facility-administered medications for this visit.      PHYSICAL EXAMINATION: ECOG PERFORMANCE STATUS: 1 - Symptomatic but completely ambulatory Vitals:   02/02/20 0931  BP: 128/83  Pulse: 98  Temp: (!) 96.5 F (35.8 C)   Physical Exam  Constitutional: He is oriented to person, place, and time. No distress.  HENT:  Head: Normocephalic and atraumatic.  Nose: Nose normal.  Mouth/Throat: Oropharynx is clear and moist. No oropharyngeal exudate.  No thrush, dry oral mucosa  Eyes: Pupils are equal, round, and reactive to light. EOM are normal. No scleral icterus.  Cardiovascular: Normal rate and regular rhythm.  No murmur heard. Pulmonary/Chest: Effort normal. No respiratory distress. He has no rales. He exhibits no tenderness.  Abdominal: Soft. He exhibits no distension. There is no abdominal tenderness.  Musculoskeletal:        General: No edema. Normal range of motion.     Cervical back: Normal range of motion and neck supple.  Neurological: He is alert and oriented to person, place, and time. No cranial nerve deficit. He exhibits normal muscle tone. Coordination normal.  Skin: Skin is warm and dry. He is not diaphoretic. No erythema.  Psychiatric: Affect normal.     LABORATORY DATA:  I have reviewed the data as listed Lab Results  Component Value Date   WBC 6.6 02/02/2020   HGB 11.8 (L) 02/02/2020   HCT 35.9 (L) 02/02/2020   MCV 102.9 (H) 02/02/2020   PLT 186 02/02/2020   Recent Labs    12/22/19 0840 01/12/20 0817 02/02/20 0911  NA 140 137 136  K 4.0 4.2 4.0  CL 106 107 104  CO2 23 23 23  GLUCOSE 118* 107* 106*  BUN 31* 35* 23  CREATININE 1.37* 1.32* 1.30*  CALCIUM 9.2 9.0 9.3  GFRNONAA 54* 57* 58*  GFRAA >60 >60 >60  PROT 7.0 6.7 6.8  ALBUMIN 3.8 3.5 3.7  AST 18 19 17  ALT 18 20 18  ALKPHOS 91 101 100  BILITOT 0.6 0.5 0.6  RADIOGRAPHIC STUDIES: I have personally reviewed the radiological images as listed and agreed with the findings in the report. NM PET Image Restag (PS) Skull Base To Thigh  Result  Date: 01/22/2020 CLINICAL DATA:  Subsequent treatment strategy for head neck cancer. EXAM: NUCLEAR MEDICINE PET SKULL BASE TO THIGH TECHNIQUE: 10.5 mCi F-18 FDG was injected intravenously. Full-ring PET imaging was performed from the skull base to thigh after the radiotracer. CT data was obtained and used for attenuation correction and anatomic localization. Fasting blood glucose: 90 mg/dl COMPARISON:  12/18/2019 FINDINGS: Mediastinal blood pool activity: SUV max   2.2 Liver activity: SUV max NA NECK: Similar asymmetric uptake along the left focal cord. Index hypermetabolic right level III lymph node identified on the previous study shows stable to slight decrease in hypermetabolism with SUV max = 3.2 today compared to 4.9 previously. This ill-defined node is visible on 53/3 today with stable 8 mm short axis measurement. Incidental CT findings: none CHEST: Previously described pretracheal node seen on image 71/3 today has decreased slightly in size in the interval measuring 10 mm short axis today compared to 12 mm previously. SUV max = 4.1 today compared to 7.5 previously. Hypermetabolic high right paratracheal node identified previously is stable in size at 9 mm (64/3) with SUV max = 2.8 today compared to 4.3 previously. Right upper lobe pulmonary nodule has decreased in size in the interval measuring 10 mm today compared to 15 mm previously. SUV max = 2.5 today compared to 8.1 previously. Incidental CT findings: Right Port-A-Cath tip is positioned at the SVC/RA junction. Coronary artery calcification is evident. Thoracic aortic atherosclerosis evident. ABDOMEN/PELVIS: No abnormal hypermetabolic activity within the liver, pancreas, adrenal glands, or spleen. No hypermetabolic lymph nodes in the abdomen or pelvis. Incidental CT findings: Cysts are again noted in the left liver and right kidney. Diffuse diverticular disease noted in the colon without evidence of diverticulitis. Abdominal aortic atherosclerosis.  SKELETON: Diffuse FDG accumulation in the marrow space likely related to stimulatory effects of therapy. Incidental CT findings: No worrisome lytic or sclerotic osseous abnormality. IMPRESSION: 1. The small hypermetabolic right-sided level III cervical node measures similar size today with slight decrease in hypermetabolism. 2. The previously identified pretracheal and high right paratracheal hypermetabolic lymph nodes identified previously are stable to decreased in size in the interval with both showing decreased hypermetabolism. 3. Hypermetabolic right upper lobe pulmonary nodule is both decreased in size and hypermetabolic FDG uptake. 4. No new sites of hypermetabolic soft tissue disease in the neck, chest, abdomen, or pelvis to raise concern for disease progression. Electronically Signed   By: Eric  Mansell M.D.   On: 01/22/2020 10:28   NM PET Image Restag (PS) Skull Base To Thigh  Result Date: 12/18/2019 CLINICAL DATA:  Subsequent treatment strategy for head neck carcinoma. EXAM: NUCLEAR MEDICINE PET SKULL BASE TO THIGH TECHNIQUE: 9.14 mCi F-18 FDG was injected intravenously. Full-ring PET imaging was performed from the skull base to thigh after the radiotracer. CT data was obtained and used for attenuation correction and anatomic localization. Fasting blood glucose: 78 mg/dl COMPARISON:  None. FINDINGS: Mediastinal blood pool activity: SUV max 2.21 Liver activity: SUV max 3.5 NECK: Asymmetric activity in the glottis with decreased activity in the RIGHT vocal cord. Hypermetabolic lymph node in the RIGHT level III nodal location adjacent to the thyroid cartilage with SUV max equal 4.9. This node is poorly defined on CT measuring approximately 8 mm (image 55/3. no change from prior. Incidental CT findings: none CHEST: Hypermetabolic lymph node anterior to the trachea in the upper mediastinum with SUV max equal 7.5 decreased from SUV max equal 16. This lymph node is smudgy measuring 12 mm (image 75/3) and  decreased significantly from 20 mm on prior. Hypermetabolic high RIGHT paratracheal lymph node posterior to the trachea with SUV max equal 4.3 decreased from SUV max equal 16 on prior. This lymph node is poorly defined measuring approximately 9 mm with SUV decreased from 25 mm on prior. Incidental CT findings: Within the RIGHT upper lobe round 15 mm nodule (image 105/3) has intense metabolic activity SUV max equal 8.1.   Previously nodule measures 14 mm SUV max equal 9.1 for no significant change. ABDOMEN/PELVIS: No abnormal hypermetabolic activity within the liver, pancreas, adrenal glands, or spleen. No hypermetabolic lymph nodes in the abdomen or pelvis. Incidental CT findings: Atherosclerotic calcification of the aorta. SKELETON: No focal hypermetabolic activity to suggest skeletal metastasis. Incidental CT findings: none IMPRESSION: 1. Decrease in size and metabolic activity of previously large upper mediastinal paratracheal lymph nodes. Lymph nodes have significant residual metabolic activity. 2. No significant change in hypermetabolic RIGHT upper lobe pulmonary nodule. 3. No change in small hypermetabolic RIGHT level 3 lymph node in the neck. 4. No evidence of disease progression. Electronically Signed   By: Stewart  Edmunds M.D.   On: 12/18/2019 14:01     ASSESSMENT & PLAN:   1. 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HCC)   2. Mucositis   3. Encounter for antineoplastic immunotherapy   4. 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 S/p 6 cycles Keytruda/carboplatin/5-FU day 1-4 treatments- Patient tolerates the treatment with moderate difficulties due to mucositis. Labs are reviewed and discussed with patient. Interim PET scan was reviewed and discussed with patient. Continues to have good partial response. Residual low grade FDG activity right-sided level 3 cervical node, pretracheal, paratracheal  lymph nodes, right upper lobe pulmonary nodule.  No new sites of hypermetabolic lesions. I discussed with patient about the plan of proceeding with maintenance immunotherapy with Keytruda.  He has a PD-L1 CPS 20, and I plan to obtain another scan 8 to 10 weeks after starting maintenance therapy.  Mucositis has improved. #CKD, avoid nephrotoxins.  Monitor.  Creatinine is 1.3, stable.   All questions were answered. The patient knows to call the clinic with any problems questions or concerns.  Follow up in 3 weeks.     , MD, PhD Hematology Oncology [facility] 02/02/2020   

## 2020-02-20 ENCOUNTER — Encounter: Payer: Self-pay | Admitting: Oncology

## 2020-02-20 NOTE — Progress Notes (Signed)
Patient is coming in for follow up and treatment, he is doing well no complaints

## 2020-02-23 ENCOUNTER — Encounter: Payer: Self-pay | Admitting: Oncology

## 2020-02-23 ENCOUNTER — Inpatient Hospital Stay (HOSPITAL_BASED_OUTPATIENT_CLINIC_OR_DEPARTMENT_OTHER): Payer: 59 | Admitting: Oncology

## 2020-02-23 ENCOUNTER — Other Ambulatory Visit: Payer: Self-pay

## 2020-02-23 ENCOUNTER — Inpatient Hospital Stay: Payer: 59

## 2020-02-23 VITALS — BP 129/84 | HR 90 | Temp 97.5°F | Resp 16 | Wt 195.8 lb

## 2020-02-23 DIAGNOSIS — N1831 Chronic kidney disease, stage 3a: Secondary | ICD-10-CM | POA: Diagnosis not present

## 2020-02-23 DIAGNOSIS — C109 Malignant neoplasm of oropharynx, unspecified: Secondary | ICD-10-CM

## 2020-02-23 DIAGNOSIS — Z5112 Encounter for antineoplastic immunotherapy: Secondary | ICD-10-CM | POA: Diagnosis not present

## 2020-02-23 LAB — CBC WITH DIFFERENTIAL/PLATELET
Abs Immature Granulocytes: 0.01 10*3/uL (ref 0.00–0.07)
Basophils Absolute: 0.1 10*3/uL (ref 0.0–0.1)
Basophils Relative: 1 %
Eosinophils Absolute: 0.2 10*3/uL (ref 0.0–0.5)
Eosinophils Relative: 3 %
HCT: 38.3 % — ABNORMAL LOW (ref 39.0–52.0)
Hemoglobin: 13.2 g/dL (ref 13.0–17.0)
Immature Granulocytes: 0 %
Lymphocytes Relative: 8 %
Lymphs Abs: 0.6 10*3/uL — ABNORMAL LOW (ref 0.7–4.0)
MCH: 34.2 pg — ABNORMAL HIGH (ref 26.0–34.0)
MCHC: 34.5 g/dL (ref 30.0–36.0)
MCV: 99.2 fL (ref 80.0–100.0)
Monocytes Absolute: 0.5 10*3/uL (ref 0.1–1.0)
Monocytes Relative: 6 %
Neutro Abs: 5.8 10*3/uL (ref 1.7–7.7)
Neutrophils Relative %: 82 %
Platelets: 182 10*3/uL (ref 150–400)
RBC: 3.86 MIL/uL — ABNORMAL LOW (ref 4.22–5.81)
RDW: 13.6 % (ref 11.5–15.5)
WBC: 7.2 10*3/uL (ref 4.0–10.5)
nRBC: 0 % (ref 0.0–0.2)

## 2020-02-23 LAB — COMPREHENSIVE METABOLIC PANEL
ALT: 19 U/L (ref 0–44)
AST: 19 U/L (ref 15–41)
Albumin: 3.9 g/dL (ref 3.5–5.0)
Alkaline Phosphatase: 84 U/L (ref 38–126)
Anion gap: 9 (ref 5–15)
BUN: 35 mg/dL — ABNORMAL HIGH (ref 8–23)
CO2: 23 mmol/L (ref 22–32)
Calcium: 9.1 mg/dL (ref 8.9–10.3)
Chloride: 105 mmol/L (ref 98–111)
Creatinine, Ser: 1.34 mg/dL — ABNORMAL HIGH (ref 0.61–1.24)
GFR calc Af Amer: >60 mL/min (ref >60)
GFR calc non Af Amer: 56 mL/min — ABNORMAL LOW (ref >60)
Glucose, Bld: 109 mg/dL — ABNORMAL HIGH (ref 70–99)
Potassium: 4 mmol/L (ref 3.5–5.1)
Sodium: 137 mmol/L (ref 135–145)
Total Bilirubin: 0.8 mg/dL (ref 0.3–1.2)
Total Protein: 6.8 g/dL (ref 6.5–8.1)

## 2020-02-23 LAB — TSH: TSH: 3.777 u[IU]/mL (ref 0.350–4.500)

## 2020-02-23 MED ORDER — SODIUM CHLORIDE 0.9 % IV SOLN
200.0000 mg | Freq: Once | INTRAVENOUS | Status: AC
  Administered 2020-02-23: 10:00:00 200 mg via INTRAVENOUS
  Filled 2020-02-23: qty 8

## 2020-02-23 MED ORDER — HEPARIN SOD (PORK) LOCK FLUSH 100 UNIT/ML IV SOLN
500.0000 [IU] | Freq: Once | INTRAVENOUS | Status: AC | PRN
  Administered 2020-02-23: 11:00:00 500 [IU]
  Filled 2020-02-23: qty 5

## 2020-02-23 MED ORDER — SODIUM CHLORIDE 0.9 % IV SOLN
Freq: Once | INTRAVENOUS | Status: AC
  Filled 2020-02-23: qty 250

## 2020-02-23 MED ORDER — HEPARIN SOD (PORK) LOCK FLUSH 100 UNIT/ML IV SOLN
INTRAVENOUS | Status: AC
  Filled 2020-02-23: qty 5

## 2020-02-23 NOTE — Progress Notes (Signed)
Hematology/Oncology  Follow Up note [facility] [phone] [phone]   Patient Care Team: Albina Billet, MD as PCP - General (Internal Medicine) Albina Billet, MD (Internal Medicine) Bary Castilla, Forest Gleason, MD (General Surgery) Noreene Filbert, MD as Referring Physician (Radiation Oncology) Earlie Server, MD as Consulting Physician (Oncology)  REFERRING PROVIDER: Dr.McQueen CHIEF COMPLAINTS/PURPOSE OF CONSULTATION:  Follow up for head and neck cancer.  HISTORY OF PRESENTING ILLNESS:  Ivan Garcia. is a  64 y.o.  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 P16 positive. cT2 cN2 disease, stage II, Concurrent ChemoRT  Cisplatin 100 mg/m2 q3weeks with concurrent RT which started on 12/31/2017.  S/p one dose of Cisplatin. Cisplatin discontinued due to nephrotoxicity. Switch to weekly Carboplatin (AUC 2) / Taxol 32m/m2 [ finished May 2019] Patient has finished concurrent chemoradiation in May 2019  # He reports doing well until in July 2020, he started to have hoarseness after singing for 2 to 3 hours. He has changed his insurance and Dr. MTami Ribasis no longer covered by his current insurance.  So he [facility]. Flexible laryngoscopy showed Vocal cord paralysis.  Patient was recommended to have a PET scan done for restaging. Patient's primary care provider Dr. THall Busingordered a PET scan for patient which was done on 08/20/2019.  PET scan unfortunately showed metastatic disease.  Patient called cancer center and is scheduled follow-up with me. 08/20/2019 PET scan showed asymmetric hypermetabolic him involving the left vocal cords with hypermetabolic adenopathy in the neck, and the chest.  He also has hypermetabolic pulmonary nodule along the minor fissure which is felt  to be metastatic. Tiny proximal left ureteral stone without hydronephrosis.  Possible gallbladder sludge.  Enlarged prostate.  Atherosclerosis.  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  # NGS showed PD-L1 CPS 20, TMB indeterminant, MSI stable,PIK3CA mutation  INTERVAL HISTORY TChaz McglassonHThompson Garcia is a 64y.o. male who presents for follow-up metastatic squamous cell carcinoma, P 16+.   Evaluation prior to chemotherapy. He is s/p 6 cycles of carboplatin, 5-FU and Keytruda.  Currently on maintenance Keytruda.  He tolerates well. No new complaints.    Review of Systems  Constitutional: Negative for chills, fever, malaise/fatigue and weight loss.  HENT: Negative for sore throat.   Eyes: Negative for redness.  Respiratory: Negative for cough, shortness of breath and wheezing.   Cardiovascular: Negative for chest pain, palpitations and leg swelling.  Gastrointestinal: Negative for abdominal pain, blood in stool, nausea and vomiting.  Genitourinary: Negative for dysuria.  Musculoskeletal: Negative for myalgias.  Skin: Negative for rash.  Neurological: Negative for dizziness, tingling and tremors.  Endo/Heme/Allergies: Does not bruise/bleed easily.  Psychiatric/Behavioral: Negative for hallucinations.    MEDICAL HISTORY:  Past Medical History:  Diagnosis Date  . Arthritis   . [place]    Head and [place] cancer  . Hemorrhoids   . Hyperlipidemia   . Hypertension     SURGICAL HISTORY: Past Surgical History:  Procedure Laterality Date  . COLONOSCOPY WITH PROPOFOL N/A 04/04/2017   Procedure: COLONOSCOPY WITH PROPOFOL;  Surgeon: JRobert Bellow MD;  [facility];  Service: Endoscopy;  Laterality: N/A;  . MANDIBLE SURGERY    . PORT-A-CATH REMOVAL  07/17/2018  . PORTA CATH INSERTION  N/A 12/19/2017   Procedure: PORTA CATH INSERTION;  Surgeon: Algernon Huxley, MD;  Location: [place]  [facility];  Service: Cardiovascular;  Laterality: N/A;  . PORTA CATH INSERTION N/A 07/17/2018   Procedure: PORTA CATH INSERTION;  Surgeon: Algernon Huxley, MD;  Location: [place] [facility];  Service: Cardiovascular;  Laterality: N/A;  . PORTA CATH INSERTION N/A 09/18/2019   Procedure: PORTA CATH INSERTION;  Surgeon: Algernon Huxley, MD;  Location: [place] [facility];  Service: Cardiovascular;  Laterality: N/A;  . TONSILLECTOMY      SOCIAL HISTORY: Social History   Socioeconomic History  . Marital status: Single    Spouse name: Not on file  . Number of children: Not on file  . Years of education: Not on file  . Highest education level: Not on file  Occupational History  . Not on file  Tobacco Use  . Smoking status: Never Smoker  . Smokeless tobacco: Never Used  Substance and Sexual Activity  . Alcohol use: No  . Drug use: No  . Sexual activity: Not on file  Other Topics Concern  . Not on file  Social History Narrative  . Not on file   Social Determinants of Health   Financial Resource Strain:   . Difficulty of Paying Living Expenses:   Food Insecurity:   . Worried About Charity fundraiser in the Last Year:   . Arboriculturist in the Last Year:   Transportation Needs:   . Film/video editor (Medical):   Marland Kitchen Lack of Transportation (Non-Medical):   Physical Activity:   . Days of Exercise per Week:   . Minutes of Exercise per Session:   Stress:   . Feeling of Stress :   Social Connections:   . Frequency of Communication with Friends and Family:   . Frequency of Social Gatherings with Friends and Family:   . Attends Religious Services:   . Active Member of Clubs or Organizations:   . Attends Archivist Meetings:   Marland Kitchen Marital Status:   Intimate Partner Violence:   . Fear of Current or Ex-Partner:   . Emotionally Abused:   Marland Kitchen Physically Abused:   . Sexually Abused:     FAMILY HISTORY: Family History  Problem Relation Age of Onset  . Breast cancer Mother     . Asthma Mother   . Congestive Heart Failure Mother   . Prostate cancer Father   . Brain cancer Father   . Bladder Cancer Father     ALLERGIES:  has No Known Allergies.  MEDICATIONS:  Current Outpatient Medications  Medication Sig Dispense Refill  . amLODipine (NORVASC) 5 MG tablet Take 5 mg by mouth daily.    . Multiple Vitamins-Minerals (MULTIVITAMIN WITH MINERALS) tablet Take 1 tablet by mouth daily.    Marland Kitchen omeprazole (PRILOSEC) 20 MG capsule TAKE 1 CAPSULE BY MOUTH EVERY DAY 90 capsule 1  . rosuvastatin (CRESTOR) 10 MG tablet Take 10 mg by mouth every evening.  4  . dexamethasone (DECADRON) 4 MG tablet Take 2 tablets (8 mg total) by mouth daily. Start the day after carboplatin chemotherapy for 3 days. (Patient not taking: Reported on 02/20/2020) 30 tablet 1  . lidocaine (XYLOCAINE) 2 % solution     . lidocaine-prilocaine (EMLA) cream Apply to affected area once (Patient not taking: Reported on 02/20/2020) 30 g 3  . nystatin (MYCOSTATIN) 100000 UNIT/ML suspension Take 5 mLs (500,000 Units total) by mouth 4 (four) times daily. (Patient  not taking: Reported on 02/20/2020) 473 mL 0  . ondansetron (ZOFRAN) 8 MG tablet Take 1 tablet (8 mg total) by mouth 2 (two) times daily as needed for refractory nausea / vomiting. Start on day 3 after carboplatin chemo. (Patient not taking: Reported on 02/20/2020) 30 tablet 1  . oxyCODONE (ROXICODONE) 5 MG immediate release tablet Take 1 tablet (5 mg total) by mouth every 12 (twelve) hours as needed for moderate pain, severe pain or breakthrough pain. (Patient not taking: Reported on 02/20/2020) 30 tablet 0  . prochlorperazine (COMPAZINE) 10 MG tablet Take 1 tablet (10 mg total) by mouth every 6 (six) hours as needed (Nausea or vomiting). (Patient not taking: Reported on 02/20/2020) 30 tablet 1   No current facility-administered medications for this visit.     PHYSICAL EXAMINATION: ECOG PERFORMANCE STATUS: 1 - Symptomatic but completely ambulatory Vitals:    02/23/20 0844  BP: 129/84  Pulse: 90  Resp: 16  Temp: (!) 97.5 F (36.4 C)   Physical Exam  Constitutional: He is oriented to person, place, and time. No distress.  HENT:  Head: Normocephalic and atraumatic.  Nose: Nose normal.  Mouth/Throat: Oropharynx is clear and moist. No oropharyngeal exudate.  No thrush, dry oral mucosa  Eyes: Pupils are equal, round, and reactive to light. EOM are normal. No scleral icterus.  Cardiovascular: Normal rate and regular rhythm.  No murmur heard. Pulmonary/Chest: Effort normal. No respiratory distress. He has no rales. He exhibits no tenderness.  Abdominal: Soft. He exhibits no distension. There is no abdominal tenderness.  Musculoskeletal:        General: No edema. Normal range of motion.     Cervical back: Normal range of motion and neck supple.  Neurological: He is alert and oriented to person, place, and time. No cranial nerve deficit. He exhibits normal muscle tone. Coordination normal.  Skin: Skin is warm and dry. He is not diaphoretic. No erythema.  Psychiatric: Affect normal.     LABORATORY DATA:  I have reviewed the data as listed Lab Results  Component Value Date   WBC 7.2 02/23/2020   HGB 13.2 02/23/2020   HCT 38.3 (L) 02/23/2020   MCV 99.2 02/23/2020   PLT 182 02/23/2020   Recent Labs    01/12/20 0817 02/02/20 0911 02/23/20 0831  NA 137 136 137  K 4.2 4.0 4.0  CL 107 104 105  CO2 '23 23 23  ' GLUCOSE 107* 106* 109*  BUN 35* 23 35*  CREATININE 1.32* 1.30* 1.34*  CALCIUM 9.0 9.3 9.1  GFRNONAA 57* 58* 56*  GFRAA >60 >60 >60  PROT 6.7 6.8 6.8  ALBUMIN 3.5 3.7 3.9  AST '19 17 19  ' ALT '20 18 19  ' ALKPHOS 101 100 84  BILITOT 0.5 0.6 0.8  RADIOGRAPHIC STUDIES: I have personally reviewed the radiological images as listed and agreed with the findings in the report. NM PET Image Restag (PS) Skull Base To Thigh  Result Date: 01/22/2020 CLINICAL DATA:  Subsequent treatment strategy for head neck cancer. EXAM: NUCLEAR MEDICINE  PET SKULL BASE TO THIGH TECHNIQUE: 10.5 mCi F-18 FDG was injected intravenously. Full-ring PET imaging was performed from the skull base to thigh after the radiotracer. CT data was obtained and used for attenuation correction and anatomic localization. Fasting blood glucose: 90 mg/dl COMPARISON:  12/18/2019 FINDINGS: Mediastinal blood pool activity: SUV max 2.2 Liver activity: SUV max NA NECK: Similar asymmetric uptake along the left focal cord. Index hypermetabolic right level III lymph node identified on the previous  study shows stable to slight decrease in hypermetabolism with SUV max = 3.2 today compared to 4.9 previously. This ill-defined node is visible on 53/3 today with stable 8 mm short axis measurement. Incidental CT findings: none CHEST: Previously described pretracheal node seen on image 71/3 today has decreased slightly in size in the interval measuring 10 mm short axis today compared to 12 mm previously. SUV max = 4.1 today compared to 7.5 previously. Hypermetabolic high right paratracheal node identified previously is stable in size at 9 mm (64/3) with SUV max = 2.8 today compared to 4.3 previously. Right upper lobe pulmonary nodule has decreased in size in the interval measuring 10 mm today compared to 15 mm previously. SUV max = 2.5 today compared to 8.1 previously. Incidental CT findings: Right Port-A-Cath tip is positioned at the SVC/RA junction. Coronary artery calcification is evident. Thoracic aortic atherosclerosis evident. ABDOMEN/PELVIS: No abnormal hypermetabolic activity within the liver, pancreas, adrenal glands, or spleen. No hypermetabolic lymph nodes in the abdomen or pelvis. Incidental CT findings: Cysts are again noted in the left liver and right kidney. Diffuse diverticular disease noted in the colon without evidence of diverticulitis. Abdominal aortic atherosclerosis. SKELETON: Diffuse FDG accumulation in the marrow space likely related to stimulatory effects of therapy. Incidental  CT findings: No worrisome lytic or sclerotic osseous abnormality. IMPRESSION: 1. The small hypermetabolic right-sided level III cervical node measures similar size today with slight decrease in hypermetabolism. 2. The previously identified pretracheal and high right paratracheal hypermetabolic lymph nodes identified previously are stable to decreased in size in the interval with both showing decreased hypermetabolism. 3. Hypermetabolic right upper lobe pulmonary nodule is both decreased in size and hypermetabolic FDG uptake. 4. No new sites of hypermetabolic soft tissue disease in the neck, chest, abdomen, or pelvis to raise concern for disease progression. Electronically Signed   By: Misty Stanley M.D.   On: 01/22/2020 10:28   NM PET Image Restag (PS) Skull Base To Thigh  Result Date: 12/18/2019 CLINICAL DATA:  Subsequent treatment strategy for head neck carcinoma. EXAM: NUCLEAR MEDICINE PET SKULL BASE TO THIGH TECHNIQUE: 9.14 mCi F-18 FDG was injected intravenously. Full-ring PET imaging was performed from the skull base to thigh after the radiotracer. CT data was obtained and used for attenuation correction and anatomic localization. Fasting blood glucose: 78 mg/dl COMPARISON:  None. FINDINGS: Mediastinal blood pool activity: SUV max 2.21 Liver activity: SUV max 3.5 NECK: Asymmetric activity in the glottis with decreased activity in the RIGHT vocal cord. Hypermetabolic lymph node in the RIGHT level III nodal location adjacent to the thyroid cartilage with SUV max equal 4.9. This node is poorly defined on CT measuring approximately 8 mm (image 55/3. no change from prior. Incidental CT findings: none CHEST: Hypermetabolic lymph node anterior to the trachea in the upper mediastinum with SUV max equal 7.5 decreased from SUV max equal 16. This lymph node is smudgy measuring 12 mm (image 75/3) and decreased significantly from 20 mm on prior. Hypermetabolic high RIGHT paratracheal lymph node posterior to the trachea  with SUV max equal 4.3 decreased from SUV max equal 16 on prior. This lymph node is poorly defined measuring approximately 9 mm with SUV decreased from 25 mm on prior. Incidental CT findings: Within the RIGHT upper lobe round 15 mm nodule (image 105/3) has intense metabolic activity SUV max equal 8.1. Previously nodule measures 14 mm SUV max equal 9.1 for no significant change. ABDOMEN/PELVIS: No abnormal hypermetabolic activity within the liver, pancreas, adrenal glands, or spleen.  No hypermetabolic lymph nodes in the abdomen or pelvis. Incidental CT findings: Atherosclerotic calcification of the aorta. SKELETON: No focal hypermetabolic activity to suggest skeletal metastasis. Incidental CT findings: none IMPRESSION: 1. Decrease in size and metabolic activity of previously large upper mediastinal paratracheal lymph nodes. Lymph nodes have significant residual metabolic activity. 2. No significant change in hypermetabolic RIGHT upper lobe pulmonary nodule. 3. No change in small hypermetabolic RIGHT level 3 lymph node in the neck. 4. No evidence of disease progression. Electronically Signed   By: Suzy Bouchard M.D.   On: 12/18/2019 14:01     ASSESSMENT & PLAN:   1. 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[place])   2. Encounter for antineoplastic immunotherapy   3. 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  Labs are reviewed and discussed with patient. Proceed with today's Keytruda.  Repeat PET after 4 cycles of Keytruda.   Stage 3a CKD, avoid nephrotoxins.  Chronic lymphocytopenia, due to previous chemotherapy.   All questions were answered. The patient knows to call the clinic with any problems questions or concerns.  Follow up in 3 weeks.    Earlie Server, MD, PhD Hematology Oncology [place]  at [facility] 02/23/2020

## 2020-03-08 NOTE — Progress Notes (Signed)
Pharmacist Chemotherapy Monitoring - Follow Up Assessment    I verify that I have reviewed each item in the below checklist:  . Regimen for the patient is scheduled for the appropriate day and plan matches scheduled date. Marland Kitchen Appropriate non-routine labs are ordered dependent on drug ordered. . If applicable, additional medications reviewed and ordered per protocol based on lifetime cumulative doses and/or treatment regimen.   Plan for follow-up and/or issues identified: No . I-vent associated with next due treatment: No . MD and/or nursing notified: No  Dessie Tatem K 03/08/2020 11:39 AM

## 2020-03-15 ENCOUNTER — Inpatient Hospital Stay (HOSPITAL_BASED_OUTPATIENT_CLINIC_OR_DEPARTMENT_OTHER): Payer: 59 | Admitting: Oncology

## 2020-03-15 ENCOUNTER — Other Ambulatory Visit: Payer: Self-pay

## 2020-03-15 ENCOUNTER — Inpatient Hospital Stay: Payer: 59

## 2020-03-15 ENCOUNTER — Encounter: Payer: Self-pay | Admitting: Oncology

## 2020-03-15 ENCOUNTER — Inpatient Hospital Stay: Payer: 59 | Attending: Oncology

## 2020-03-15 VITALS — BP 126/87 | HR 90 | Temp 97.1°F | Resp 18 | Wt 194.0 lb

## 2020-03-15 DIAGNOSIS — C109 Malignant neoplasm of oropharynx, unspecified: Secondary | ICD-10-CM | POA: Diagnosis not present

## 2020-03-15 DIAGNOSIS — N1831 Chronic kidney disease, stage 3a: Secondary | ICD-10-CM | POA: Diagnosis not present

## 2020-03-15 DIAGNOSIS — Z5112 Encounter for antineoplastic immunotherapy: Secondary | ICD-10-CM | POA: Insufficient documentation

## 2020-03-15 DIAGNOSIS — D72810 Lymphocytopenia: Secondary | ICD-10-CM | POA: Diagnosis not present

## 2020-03-15 LAB — COMPREHENSIVE METABOLIC PANEL
ALT: 18 U/L (ref 0–44)
AST: 19 U/L (ref 15–41)
Albumin: 3.7 g/dL (ref 3.5–5.0)
Alkaline Phosphatase: 76 U/L (ref 38–126)
Anion gap: 6 (ref 5–15)
BUN: 23 mg/dL (ref 8–23)
CO2: 25 mmol/L (ref 22–32)
Calcium: 9.1 mg/dL (ref 8.9–10.3)
Chloride: 106 mmol/L (ref 98–111)
Creatinine, Ser: 1.21 mg/dL (ref 0.61–1.24)
GFR calc Af Amer: >60 mL/min (ref >60)
GFR calc non Af Amer: >60 mL/min (ref >60)
Glucose, Bld: 93 mg/dL (ref 70–99)
Potassium: 4.1 mmol/L (ref 3.5–5.1)
Sodium: 137 mmol/L (ref 135–145)
Total Bilirubin: 1 mg/dL (ref 0.3–1.2)
Total Protein: 6.9 g/dL (ref 6.5–8.1)

## 2020-03-15 LAB — CBC WITH DIFFERENTIAL/PLATELET
Abs Immature Granulocytes: 0.01 10*3/uL (ref 0.00–0.07)
Basophils Absolute: 0.1 10*3/uL (ref 0.0–0.1)
Basophils Relative: 1 %
Eosinophils Absolute: 0.2 10*3/uL (ref 0.0–0.5)
Eosinophils Relative: 4 %
HCT: 41.8 % (ref 39.0–52.0)
Hemoglobin: 14.1 g/dL (ref 13.0–17.0)
Immature Granulocytes: 0 %
Lymphocytes Relative: 11 %
Lymphs Abs: 0.6 10*3/uL — ABNORMAL LOW (ref 0.7–4.0)
MCH: 32.6 pg (ref 26.0–34.0)
MCHC: 33.7 g/dL (ref 30.0–36.0)
MCV: 96.8 fL (ref 80.0–100.0)
Monocytes Absolute: 0.5 10*3/uL (ref 0.1–1.0)
Monocytes Relative: 10 %
Neutro Abs: 3.8 10*3/uL (ref 1.7–7.7)
Neutrophils Relative %: 74 %
Platelets: 235 10*3/uL (ref 150–400)
RBC: 4.32 MIL/uL (ref 4.22–5.81)
RDW: 12.3 % (ref 11.5–15.5)
WBC: 5.2 10*3/uL (ref 4.0–10.5)
nRBC: 0 % (ref 0.0–0.2)

## 2020-03-15 MED ORDER — SODIUM CHLORIDE 0.9 % IV SOLN
Freq: Once | INTRAVENOUS | Status: AC
  Filled 2020-03-15: qty 250

## 2020-03-15 MED ORDER — SODIUM CHLORIDE 0.9 % IV SOLN
200.0000 mg | Freq: Once | INTRAVENOUS | Status: AC
  Administered 2020-03-15: 200 mg via INTRAVENOUS
  Filled 2020-03-15: qty 8

## 2020-03-15 MED ORDER — HEPARIN SOD (PORK) LOCK FLUSH 100 UNIT/ML IV SOLN
500.0000 [IU] | Freq: Once | INTRAVENOUS | Status: AC
  Administered 2020-03-15: 11:00:00 500 [IU] via INTRAVENOUS
  Filled 2020-03-15: qty 5

## 2020-03-15 MED ORDER — SODIUM CHLORIDE 0.9% FLUSH
10.0000 mL | Freq: Once | INTRAVENOUS | Status: AC
  Administered 2020-03-15: 09:00:00 10 mL via INTRAVENOUS
  Filled 2020-03-15: qty 10

## 2020-03-15 NOTE — Progress Notes (Signed)
Neck pain episodes.

## 2020-03-15 NOTE — Progress Notes (Signed)
Hematology/Oncology  Follow Up note [facility] [phone] [phone]) [phone]   Patient Care Team: Albina Billet, MD as PCP - General (Internal Medicine) Albina Billet, MD (Internal Medicine) Bary Castilla, Forest Gleason, MD (General Surgery) Noreene Filbert, MD as Referring Physician (Radiation Oncology) Earlie Server, MD as Consulting Physician (Oncology)  REFERRING PROVIDER: Dr.McQueen CHIEF COMPLAINTS/PURPOSE OF CONSULTATION:  Follow up for head and neck cancer.  HISTORY OF PRESENTING ILLNESS:  Ivan Helmstetter. is a  64 y.o.  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 P16 positive. cT2 cN2 disease, stage II, Concurrent ChemoRT  Cisplatin 100 mg/m2 q3weeks with concurrent RT which started on 12/31/2017.  S/p one dose of Cisplatin. Cisplatin discontinued due to nephrotoxicity. Switch to weekly Carboplatin (AUC 2) / Taxol 32m/m2 [ finished May 2019] Patient has finished concurrent chemoradiation in May 2019  # He reports doing well until in July 2020, he started to have hoarseness after singing for 2 to 3 hours. He has changed his insurance and Dr. MTami Ribasis no longer covered by his current insurance.  So he establish care with Dr.Anjolina Byrer KJuliann Pulseat [place] was seen on 08/04/2019. Flexible laryngoscopy showed Vocal cord paralysis.  Patient was recommended to have a PET scan done for restaging. Patient's primary care provider Dr. THall Busingordered a PET scan for patient which was done on 08/20/2019.  PET scan unfortunately showed metastatic disease.  Patient called cancer center and is scheduled follow-up with me. 08/20/2019 PET scan showed asymmetric hypermetabolic him involving the left vocal cords with hypermetabolic adenopathy in the neck, and the chest.  He also has hypermetabolic pulmonary nodule along the minor fissure which is felt  to be metastatic. Tiny proximal left ureteral stone without hydronephrosis.  Possible gallbladder sludge.  Enlarged prostate.  Atherosclerosis.  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  # NGS showed PD-L1 CPS 20, TMB indeterminant, MSI stable,PIK3CA mutation  INTERVAL HISTORY Ivan Garcia is a 64y.o. male who presents for follow-up metastatic squamous cell carcinoma, P 16+.   Evaluation prior to chemotherapy. He is s/p 6 cycles of carboplatin, 5-FU and Keytruda.  Patient is currently on immunotherapy maintenance with Keytruda. He reports intermittent neck pain.  Otherwise no new complaints.  Denies any fever, chills, nausea, skin rash, diarrhea     Review of Systems  Constitutional: Negative for chills, fever, malaise/fatigue and weight loss.  HENT: Negative for sore throat.   Eyes: Negative for redness.  Respiratory: Negative for cough, shortness of breath and wheezing.   Cardiovascular: Negative for chest pain, palpitations and leg swelling.  Gastrointestinal: Negative for abdominal pain, blood in stool, nausea and vomiting.  Genitourinary: Negative for dysuria.  Musculoskeletal: Negative for myalgias.  Skin: Negative for rash.  Neurological: Negative for dizziness, tingling and tremors.  Endo/Heme/Allergies: Does not bruise/bleed easily.  Psychiatric/Behavioral: Negative for hallucinations.    MEDICAL HISTORY:  Past Medical History:  Diagnosis Date  . Arthritis   [place]    Head [place] cancer  . Hemorrhoids   . Hyperlipidemia   . Hypertension     SURGICAL HISTORY: Past Surgical History:  Procedure Laterality Date  . COLONOSCOPY WITH PROPOFOL N/A 04/04/2017   Procedure: COLONOSCOPY WITH PROPOFOL;  Surgeon: JRobert Bellow MD;  [facility]  Service: Endoscopy;  Laterality: N/A;  .  MANDIBLE SURGERY    . PORT-A-CATH REMOVAL  07/17/2018  . PORTA CATH  INSERTION N/A 12/19/2017   Procedure: PORTA CATH INSERTION;  Surgeon: Algernon Huxley, MD;  Location: [place] [facility];  Service: Cardiovascular;  Laterality: N/A;  . PORTA CATH INSERTION N/A 07/17/2018   Procedure: PORTA CATH INSERTION;  Surgeon: Algernon Huxley, MD;  Location: [place] [facility];  Service: Cardiovascular;  Laterality: N/A;  . PORTA CATH INSERTION N/A 09/18/2019   Procedure: PORTA CATH INSERTION;  Surgeon: Algernon Huxley, MD;  Location: [place] [facility];  Service: Cardiovascular;  Laterality: N/A;  . TONSILLECTOMY      SOCIAL HISTORY: Social History   Socioeconomic History  . Marital status: Single    Spouse name: Not on file  . Number of children: Not on file  . Years of education: Not on file  . Highest education level: Not on file  Occupational History  . Not on file  Tobacco Use  . Smoking status: Never Smoker  . Smokeless tobacco: Never Used  Substance and Sexual Activity  . Alcohol use: No  . Drug use: No  . Sexual activity: Not on file  Other Topics Concern  . Not on file  Social History Narrative  . Not on file   Social Determinants of Health   Financial Resource Strain:   . Difficulty of Paying Living Expenses:   Food Insecurity:   . Worried About Charity fundraiser in the Last Year:   . Arboriculturist in the Last Year:   Transportation Needs:   . Film/video editor (Medical):   Marland Kitchen Lack of Transportation (Non-Medical):   Physical Activity:   . Days of Exercise per Week:   . Minutes of Exercise per Session:   Stress:   . Feeling of Stress :   Social Connections:   . Frequency of Communication with Friends and Family:   . Frequency of Social Gatherings with Friends and Family:   . Attends Religious Services:   . Active Member of Clubs or Organizations:   . Attends Archivist Meetings:   Marland Kitchen Marital Status:   Intimate Partner Violence:   . Fear of Current or Ex-Partner:   . Emotionally Abused:   Marland Kitchen Physically Abused:   .  Sexually Abused:     FAMILY HISTORY: Family History  Problem Relation Age of Onset  . Breast cancer Mother   . Asthma Mother   . Congestive Heart Failure Mother   . Prostate cancer Father   . Brain cancer Father   . Bladder Cancer Father     ALLERGIES:  has No Known Allergies.  MEDICATIONS:  Current Outpatient Medications  Medication Sig Dispense Refill  . amLODipine (NORVASC) 5 MG tablet Take 5 mg by mouth daily.    Marland Kitchen amoxicillin (AMOXIL) 500 MG capsule TAKE 2 NOW THEN TAKE 1 CAPSULE 4 TIMES A DAY UNTIL FINISHED    . Multiple Vitamins-Minerals (MULTIVITAMIN WITH MINERALS) tablet Take 1 tablet by mouth daily.    Marland Kitchen omeprazole (PRILOSEC) 20 MG capsule TAKE 1 CAPSULE BY MOUTH EVERY DAY 90 capsule 1  . rosuvastatin (CRESTOR) 10 MG tablet Take 10 mg by mouth every evening.  4  . lidocaine (XYLOCAINE) 2 % solution     . lidocaine-prilocaine (EMLA) cream Apply to affected area once (Patient not taking: Reported on 02/20/2020) 30 g 3  . nystatin (MYCOSTATIN) 100000 UNIT/ML suspension Take 5 mLs (500,000 Units total) by mouth 4 (four) times  daily. (Patient not taking: Reported on 02/20/2020) 473 mL 0  . ondansetron (ZOFRAN) 8 MG tablet Take 1 tablet (8 mg total) by mouth 2 (two) times daily as needed for refractory nausea / vomiting. Start on day 3 after carboplatin chemo. (Patient not taking: Reported on 02/20/2020) 30 tablet 1  . oxyCODONE (ROXICODONE) 5 MG immediate release tablet Take 1 tablet (5 mg total) by mouth every 12 (twelve) hours as needed for moderate pain, severe pain or breakthrough pain. (Patient not taking: Reported on 02/20/2020) 30 tablet 0  . prochlorperazine (COMPAZINE) 10 MG tablet Take 1 tablet (10 mg total) by mouth every 6 (six) hours as needed (Nausea or vomiting). (Patient not taking: Reported on 02/20/2020) 30 tablet 1   No current facility-administered medications for this visit.     PHYSICAL EXAMINATION: ECOG PERFORMANCE STATUS: 1 - Symptomatic but completely  ambulatory Vitals:   03/15/20 0910  BP: 126/87  Pulse: 90  Resp: 18  Temp: (!) 97.1 F (36.2 C)   Physical Exam  Constitutional: He is oriented to person, place, and time. No distress.  HENT:  Head: Normocephalic and atraumatic.  Nose: Nose normal.  Mouth/Throat: Oropharynx is clear and moist. No oropharyngeal exudate.  No thrush, dry oral mucosa  Eyes: Pupils are equal, round, and reactive to light. EOM are normal. No scleral icterus.  Cardiovascular: Normal rate and regular rhythm.  No murmur heard. Pulmonary/Chest: Effort normal. No respiratory distress. He has no rales. He exhibits no tenderness.  Abdominal: Soft. He exhibits no distension. There is no abdominal tenderness.  Musculoskeletal:        General: No edema. Normal range of motion.     Cervical back: Normal range of motion and neck supple.  Neurological: He is alert and oriented to person, place, and time.  Skin: Skin is warm and dry. He is not diaphoretic. No erythema.  Psychiatric: Affect normal.     LABORATORY DATA:  I have reviewed the data as listed Lab Results  Component Value Date   WBC 5.2 03/15/2020   HGB 14.1 03/15/2020   HCT 41.8 03/15/2020   MCV 96.8 03/15/2020   PLT 235 03/15/2020   Recent Labs    02/02/20 0911 02/23/20 0831 03/15/20 0845  NA 136 137 137  K 4.0 4.0 4.1  CL 104 105 106  CO2 '23 23 25  ' GLUCOSE 106* 109* 93  BUN 23 35* 23  CREATININE 1.30* 1.34* 1.21  CALCIUM 9.3 9.1 9.1  GFRNONAA 58* 56* >60  GFRAA >60 >60 >60  PROT 6.8 6.8 6.9  ALBUMIN 3.7 3.9 3.7  AST '17 19 19  ' ALT '18 19 18  ' ALKPHOS 100 84 76  BILITOT 0.6 0.8 1.0  RADIOGRAPHIC STUDIES: I have personally reviewed the radiological images as listed and agreed with the findings in the report. NM PET Image Restag (PS) Skull Base To Thigh  Result Date: 01/22/2020 CLINICAL DATA:  Subsequent treatment strategy for head neck cancer. EXAM: NUCLEAR MEDICINE PET SKULL BASE TO THIGH TECHNIQUE: 10.5 mCi F-18 FDG was injected  intravenously. Full-ring PET imaging was performed from the skull base to thigh after the radiotracer. CT data was obtained and used for attenuation correction and anatomic localization. Fasting blood glucose: 90 mg/dl COMPARISON:  12/18/2019 FINDINGS: Mediastinal blood pool activity: SUV max 2.2 Liver activity: SUV max NA NECK: Similar asymmetric uptake along the left focal cord. Index hypermetabolic right level III lymph node identified on the previous study shows stable to slight decrease in hypermetabolism with SUV  max = 3.2 today compared to 4.9 previously. This ill-defined node is visible on 53/3 today with stable 8 mm short axis measurement. Incidental CT findings: none CHEST: Previously described pretracheal node seen on image 71/3 today has decreased slightly in size in the interval measuring 10 mm short axis today compared to 12 mm previously. SUV max = 4.1 today compared to 7.5 previously. Hypermetabolic high right paratracheal node identified previously is stable in size at 9 mm (64/3) with SUV max = 2.8 today compared to 4.3 previously. Right upper lobe pulmonary nodule has decreased in size in the interval measuring 10 mm today compared to 15 mm previously. SUV max = 2.5 today compared to 8.1 previously. Incidental CT findings: Right Port-A-Cath tip is positioned at the SVC/RA junction. Coronary artery calcification is evident. Thoracic aortic atherosclerosis evident. ABDOMEN/PELVIS: No abnormal hypermetabolic activity within the liver, pancreas, adrenal glands, or spleen. No hypermetabolic lymph nodes in the abdomen or pelvis. Incidental CT findings: Cysts are again noted in the left liver and right kidney. Diffuse diverticular disease noted in the colon without evidence of diverticulitis. Abdominal aortic atherosclerosis. SKELETON: Diffuse FDG accumulation in the marrow space likely related to stimulatory effects of therapy. Incidental CT findings: No worrisome lytic or sclerotic osseous abnormality.  IMPRESSION: 1. The small hypermetabolic right-sided level III cervical node measures similar size today with slight decrease in hypermetabolism. 2. The previously identified pretracheal and high right paratracheal hypermetabolic lymph nodes identified previously are stable to decreased in size in the interval with both showing decreased hypermetabolism. 3. Hypermetabolic right upper lobe pulmonary nodule is both decreased in size and hypermetabolic FDG uptake. 4. No new sites of hypermetabolic soft tissue disease in the neck, chest, abdomen, or pelvis to raise concern for disease progression. Electronically Signed   By: Misty Stanley M.D.   On: 01/22/2020 10:28   NM PET Image Restag (PS) Skull Base To Thigh  Result Date: 12/18/2019 CLINICAL DATA:  Subsequent treatment strategy for head neck carcinoma. EXAM: NUCLEAR MEDICINE PET SKULL BASE TO THIGH TECHNIQUE: 9.14 mCi F-18 FDG was injected intravenously. Full-ring PET imaging was performed from the skull base to thigh after the radiotracer. CT data was obtained and used for attenuation correction and anatomic localization. Fasting blood glucose: 78 mg/dl COMPARISON:  None. FINDINGS: Mediastinal blood pool activity: SUV max 2.21 Liver activity: SUV max 3.5 NECK: Asymmetric activity in the glottis with decreased activity in the RIGHT vocal cord. Hypermetabolic lymph node in the RIGHT level III nodal location adjacent to the thyroid cartilage with SUV max equal 4.9. This node is poorly defined on CT measuring approximately 8 mm (image 55/3. no change from prior. Incidental CT findings: none CHEST: Hypermetabolic lymph node anterior to the trachea in the upper mediastinum with SUV max equal 7.5 decreased from SUV max equal 16. This lymph node is smudgy measuring 12 mm (image 75/3) and decreased significantly from 20 mm on prior. Hypermetabolic high RIGHT paratracheal lymph node posterior to the trachea with SUV max equal 4.3 decreased from SUV max equal 16 on prior.  This lymph node is poorly defined measuring approximately 9 mm with SUV decreased from 25 mm on prior. Incidental CT findings: Within the RIGHT upper lobe round 15 mm nodule (image 105/3) has intense metabolic activity SUV max equal 8.1. Previously nodule measures 14 mm SUV max equal 9.1 for no significant change. ABDOMEN/PELVIS: No abnormal hypermetabolic activity within the liver, pancreas, adrenal glands, or spleen. No hypermetabolic lymph nodes in the abdomen or pelvis. Incidental  CT findings: Atherosclerotic calcification of the aorta. SKELETON: No focal hypermetabolic activity to suggest skeletal metastasis. Incidental CT findings: none IMPRESSION: 1. Decrease in size and metabolic activity of previously large upper mediastinal paratracheal lymph nodes. Lymph nodes have significant residual metabolic activity. 2. No significant change in hypermetabolic RIGHT upper lobe pulmonary nodule. 3. No change in small hypermetabolic RIGHT level 3 lymph node in the neck. 4. No evidence of disease progression. Electronically Signed   By: Suzy Bouchard M.D.   On: 12/18/2019 14:01     ASSESSMENT & PLAN:   1. 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[place])   2. Encounter for antineoplastic immunotherapy   3. Stage 3a chronic kidney disease   4. 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  Continue Keytruda maintenance today.  Labs reviewed and discussed with patient.  Stage 3a CKD, creatinine stable.  Avoid nephrotoxins. Chronic lymphocytopenia, from previous chemotherapy.  Continue to monitor.  All questions were answered. The patient knows to call the clinic with any problems questions or concerns.  Follow up in 3 weeks.    Earlie Server, MD, PhD Hematology Oncology [place] at [facility] 03/15/2020

## 2020-03-30 NOTE — Progress Notes (Signed)
Pharmacist Chemotherapy Monitoring - Follow Up Assessment    I verify that I have reviewed each item in the below checklist:  . Regimen for the patient is scheduled for the appropriate day and plan matches scheduled date. Marland Kitchen Appropriate non-routine labs are ordered dependent on drug ordered. . If applicable, additional medications reviewed and ordered per protocol based on lifetime cumulative doses and/or treatment regimen.   Plan for follow-up and/or issues identified: No . I-vent associated with next due treatment: No . MD and/or nursing notified: No  Dakwon Wenberg K 03/30/2020 8:48 AM

## 2020-04-05 ENCOUNTER — Encounter: Payer: Self-pay | Admitting: Oncology

## 2020-04-05 NOTE — Progress Notes (Signed)
Pt contacted for follow up. Reports that for the last 2 weeks he has been having intermittent ache near [place] and around esophagus. He states it is where his past tumors were.

## 2020-04-06 ENCOUNTER — Other Ambulatory Visit: Payer: Self-pay

## 2020-04-06 ENCOUNTER — Inpatient Hospital Stay: Payer: 59

## 2020-04-06 ENCOUNTER — Inpatient Hospital Stay: Payer: 59 | Attending: Oncology

## 2020-04-06 ENCOUNTER — Inpatient Hospital Stay (HOSPITAL_BASED_OUTPATIENT_CLINIC_OR_DEPARTMENT_OTHER): Payer: 59 | Admitting: Oncology

## 2020-04-06 VITALS — BP 125/83 | HR 87 | Temp 95.6°F | Resp 18 | Wt 193.0 lb

## 2020-04-06 DIAGNOSIS — C109 Malignant neoplasm of oropharynx, unspecified: Secondary | ICD-10-CM

## 2020-04-06 DIAGNOSIS — Z5112 Encounter for antineoplastic immunotherapy: Secondary | ICD-10-CM

## 2020-04-06 DIAGNOSIS — Z803 Family history of malignant neoplasm of breast: Secondary | ICD-10-CM | POA: Diagnosis not present

## 2020-04-06 DIAGNOSIS — Z8042 Family history of malignant neoplasm of prostate: Secondary | ICD-10-CM | POA: Diagnosis not present

## 2020-04-06 DIAGNOSIS — C7800 Secondary malignant neoplasm of unspecified lung: Secondary | ICD-10-CM | POA: Insufficient documentation

## 2020-04-06 DIAGNOSIS — C770 Secondary and unspecified malignant neoplasm of lymph nodes of head, face and neck: Secondary | ICD-10-CM | POA: Diagnosis not present

## 2020-04-06 DIAGNOSIS — Z8052 Family history of malignant neoplasm of bladder: Secondary | ICD-10-CM | POA: Diagnosis not present

## 2020-04-06 DIAGNOSIS — Z95828 Presence of other vascular implants and grafts: Secondary | ICD-10-CM

## 2020-04-06 DIAGNOSIS — R7989 Other specified abnormal findings of blood chemistry: Secondary | ICD-10-CM | POA: Diagnosis not present

## 2020-04-06 DIAGNOSIS — Z79899 Other long term (current) drug therapy: Secondary | ICD-10-CM | POA: Insufficient documentation

## 2020-04-06 DIAGNOSIS — N1831 Chronic kidney disease, stage 3a: Secondary | ICD-10-CM

## 2020-04-06 LAB — CBC WITH DIFFERENTIAL/PLATELET
Abs Immature Granulocytes: 0.02 10*3/uL (ref 0.00–0.07)
Basophils Absolute: 0.1 10*3/uL (ref 0.0–0.1)
Basophils Relative: 1 %
Eosinophils Absolute: 0.2 10*3/uL (ref 0.0–0.5)
Eosinophils Relative: 4 %
HCT: 39.8 % (ref 39.0–52.0)
Hemoglobin: 13.5 g/dL (ref 13.0–17.0)
Immature Granulocytes: 0 %
Lymphocytes Relative: 11 %
Lymphs Abs: 0.6 10*3/uL — ABNORMAL LOW (ref 0.7–4.0)
MCH: 31.8 pg (ref 26.0–34.0)
MCHC: 33.9 g/dL (ref 30.0–36.0)
MCV: 93.6 fL (ref 80.0–100.0)
Monocytes Absolute: 0.4 10*3/uL (ref 0.1–1.0)
Monocytes Relative: 6 %
Neutro Abs: 4.5 10*3/uL (ref 1.7–7.7)
Neutrophils Relative %: 78 %
Platelets: 285 10*3/uL (ref 150–400)
RBC: 4.25 MIL/uL (ref 4.22–5.81)
RDW: 12.2 % (ref 11.5–15.5)
WBC: 5.8 10*3/uL (ref 4.0–10.5)
nRBC: 0 % (ref 0.0–0.2)

## 2020-04-06 LAB — COMPREHENSIVE METABOLIC PANEL
ALT: 19 U/L (ref 0–44)
AST: 16 U/L (ref 15–41)
Albumin: 3.6 g/dL (ref 3.5–5.0)
Alkaline Phosphatase: 62 U/L (ref 38–126)
Anion gap: 9 (ref 5–15)
BUN: 22 mg/dL (ref 8–23)
CO2: 25 mmol/L (ref 22–32)
Calcium: 9.2 mg/dL (ref 8.9–10.3)
Chloride: 103 mmol/L (ref 98–111)
Creatinine, Ser: 1.29 mg/dL — ABNORMAL HIGH (ref 0.61–1.24)
GFR calc Af Amer: >60 mL/min (ref >60)
GFR calc non Af Amer: 58 mL/min — ABNORMAL LOW (ref >60)
Glucose, Bld: 117 mg/dL — ABNORMAL HIGH (ref 70–99)
Potassium: 4.2 mmol/L (ref 3.5–5.1)
Sodium: 137 mmol/L (ref 135–145)
Total Bilirubin: 0.8 mg/dL (ref 0.3–1.2)
Total Protein: 7 g/dL (ref 6.5–8.1)

## 2020-04-06 LAB — TSH: TSH: 4.902 u[IU]/mL — ABNORMAL HIGH (ref 0.350–4.500)

## 2020-04-06 LAB — T4, FREE: Free T4: 1.48 ng/dL — ABNORMAL HIGH (ref 0.61–1.12)

## 2020-04-06 MED ORDER — SODIUM CHLORIDE 0.9 % IV SOLN
Freq: Once | INTRAVENOUS | Status: AC
  Filled 2020-04-06: qty 250

## 2020-04-06 MED ORDER — SODIUM CHLORIDE 0.9% FLUSH
10.0000 mL | INTRAVENOUS | Status: DC | PRN
  Administered 2020-04-06: 10 mL via INTRAVENOUS
  Filled 2020-04-06: qty 10

## 2020-04-06 MED ORDER — HEPARIN SOD (PORK) LOCK FLUSH 100 UNIT/ML IV SOLN
500.0000 [IU] | Freq: Once | INTRAVENOUS | Status: AC | PRN
  Administered 2020-04-06: 500 [IU]
  Filled 2020-04-06: qty 5

## 2020-04-06 MED ORDER — HEPARIN SOD (PORK) LOCK FLUSH 100 UNIT/ML IV SOLN
INTRAVENOUS | Status: AC
  Filled 2020-04-06: qty 5

## 2020-04-06 MED ORDER — SODIUM CHLORIDE 0.9 % IV SOLN
200.0000 mg | Freq: Once | INTRAVENOUS | Status: AC
  Administered 2020-04-06: 200 mg via INTRAVENOUS
  Filled 2020-04-06: qty 8

## 2020-04-06 NOTE — Progress Notes (Signed)
Hematology/Oncology  Follow Up note [facility] [phone]) [phone] [phone]   Patient Care Team: Albina Billet, MD as PCP - General (Internal Medicine) Albina Billet, MD (Internal Medicine) Bary Castilla, Forest Gleason, MD (General Surgery) Noreene Filbert, MD as Referring Physician (Radiation Oncology) Earlie Server, MD as Consulting Physician (Oncology)  REFERRING PROVIDER: Dr.McQueen CHIEF COMPLAINTS/PURPOSE OF CONSULTATION:  Follow up for head and neck cancer.  HISTORY OF PRESENTING ILLNESS:  Ivan Garcia. is a  64 y.o.  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 P16 positive. cT2 cN2 disease, stage II, Concurrent ChemoRT  Cisplatin 100 mg/m2 q3weeks with concurrent RT which started on 12/31/2017.  S/p one dose of Cisplatin. Cisplatin discontinued due to nephrotoxicity. Switch to weekly Carboplatin (AUC 2) / Taxol 31m/m2 [ finished May 2019] Patient has finished concurrent chemoradiation in May 2019  # He reports doing well until in July 2020, he started to have hoarseness after singing for 2 to 3 hours. He has changed his insurance and Dr. MTami Ribasis no longer covered by his current insurance.  So he establish care with Dr.Zulay Corrie KJuliann Pulseat DKendalland was seen on 08/04/2019. Flexible laryngoscopy showed Vocal cord paralysis.  Patient was recommended to have a PET scan done for restaging. Patient's primary care provider Dr. THall Busingordered a PET scan for patient which was done on 08/20/2019.  PET scan unfortunately showed metastatic disease.  Patient called cancer center and is scheduled follow-up with me. 08/20/2019 PET scan showed asymmetric hypermetabolic him involving the left vocal cords with hypermetabolic adenopathy in the neck, and the chest.  He also has hypermetabolic pulmonary nodule along the minor fissure which is felt  to be metastatic. Tiny proximal left ureteral stone without hydronephrosis.  Possible gallbladder sludge.  Enlarged prostate.  Atherosclerosis.  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  # NGS showed PD-L1 CPS 20, TMB indeterminant, MSI stable,PIK3CA mutation  INTERVAL HISTORY Ivan HustedHQuantavious Garcia is a 64y.o. male who presents for follow-up metastatic squamous cell carcinoma, P 16+.   Evaluation prior to chemotherapy. He is s/p 6 cycles of carboplatin, 5-FU and Keytruda.  Patient is currently on immunotherapy maintenance with Keytruda. He reports " aches" in lower neck and upper middle chest, intermittent, no exacerbating or alleviating factors.Non radiating. He attributes to recent consumption of spicy food and hard liquor. Denies any shortness of breath. No other new complaints.  He reports intermittent neck pain.  Otherwise no new complaints.  Denies any fever, chills, nausea, skin rash, diarrhea     Review of Systems  Constitutional: Negative for chills, fever, malaise/fatigue and weight loss.  HENT: Negative for sore throat.        Lower neck/upper middle chest aches.   Eyes: Negative for redness.  Respiratory: Negative for cough, shortness of breath and wheezing.   Cardiovascular: Negative for chest pain, palpitations and leg swelling.  Gastrointestinal: Negative for abdominal pain, blood in stool, nausea and vomiting.  Genitourinary: Negative for dysuria.  Musculoskeletal: Negative for myalgias.  Skin: Negative for rash.  Neurological: Negative for dizziness, tingling and tremors.  Endo/Heme/Allergies: Does not bruise/bleed easily.  Psychiatric/Behavioral: Negative for hallucinations.    MEDICAL HISTORY:  Past Medical History:  Diagnosis Date  . Arthritis   . [place]    Head and neck cancer  .  Hemorrhoids   . Hyperlipidemia   . Hypertension     SURGICAL HISTORY: Past  Surgical History:  Procedure Laterality Date  . COLONOSCOPY WITH PROPOFOL N/A 04/04/2017   Procedure: COLONOSCOPY WITH PROPOFOL;  Surgeon: Robert Bellow, MD;  Location[facility] ENDOSCOPY;  Service: Endoscopy;  Laterality: N/A;  . MANDIBLE SURGERY    . PORT-A-CATH REMOVAL  07/17/2018  . PORTA CATH INSERTION N/A 12/19/2017   Procedure: PORTA CATH INSERTION;  Surgeon: Algernon Huxley, MD;  Location: [place] CV LAB;  Service: Cardiovascular;  Laterality: N/A;  . PORTA CATH INSERTION N/A 07/17/2018   Procedure: PORTA CATH INSERTION;  Surgeon: Algernon Huxley, MD;  Location: [place] [facility];  Service: Cardiovascular;  Laterality: N/A;  . PORTA CATH INSERTION N/A 09/18/2019   Procedure: PORTA CATH INSERTION;  Surgeon: Algernon Huxley, MD;  Location: [place] [facility];  Service: Cardiovascular;  Laterality: N/A;  . TONSILLECTOMY      SOCIAL HISTORY: Social History   Socioeconomic History  . Marital status: Single    Spouse name: Not on file  . Number of children: Not on file  . Years of education: Not on file  . Highest education level: Not on file  Occupational History  . Not on file  Tobacco Use  . Smoking status: Never Smoker  . Smokeless tobacco: Never Used  Substance and Sexual Activity  . Alcohol use: No  . Drug use: No  . Sexual activity: Not on file  Other Topics Concern  . Not on file  Social History Narrative  . Not on file   Social Determinants of Health   Financial Resource Strain:   . Difficulty of Paying Living Expenses:   Food Insecurity:   . Worried About Charity fundraiser in the Last Year:   . Arboriculturist in the Last Year:   Transportation Needs:   . Film/video editor (Medical):   Marland Kitchen Lack of Transportation (Non-Medical):   Physical Activity:   . Days of Exercise per Week:   . Minutes of Exercise per Session:   Stress:   . Feeling of Stress :   Social Connections:   . Frequency of Communication with Friends and Family:   . Frequency of Social  Gatherings with Friends and Family:   . Attends Religious Services:   . Active Member of Clubs or Organizations:   . Attends Archivist Meetings:   Marland Kitchen Marital Status:   Intimate Partner Violence:   . Fear of Current or Ex-Partner:   . Emotionally Abused:   Marland Kitchen Physically Abused:   . Sexually Abused:     FAMILY HISTORY: Family History  Problem Relation Age of Onset  . Breast cancer Mother   . Asthma Mother   . Congestive Heart Failure Mother   . Prostate cancer Father   . Brain cancer Father   . Bladder Cancer Father     ALLERGIES:  has No Known Allergies.  MEDICATIONS:  Current Outpatient Medications  Medication Sig Dispense Refill  . amLODipine (NORVASC) 5 MG tablet Take 5 mg by mouth daily.    . Multiple Vitamins-Minerals (MULTIVITAMIN WITH MINERALS) tablet Take 1 tablet by mouth daily.    Marland Kitchen omeprazole (PRILOSEC) 20 MG capsule TAKE 1 CAPSULE BY MOUTH EVERY DAY 90 capsule 1  . ondansetron (ZOFRAN) 8 MG tablet Take 1 tablet (8 mg total) by mouth 2 (two) times daily as needed for refractory nausea / vomiting. Start on day 3 after carboplatin  chemo. 30 tablet 1  . prochlorperazine (COMPAZINE) 10 MG tablet Take 1 tablet (10 mg total) by mouth every 6 (six) hours as needed (Nausea or vomiting). 30 tablet 1  . rosuvastatin (CRESTOR) 10 MG tablet Take 10 mg by mouth every evening.  4  . lidocaine (XYLOCAINE) 2 % solution     . lidocaine-prilocaine (EMLA) cream Apply to affected area once (Patient not taking: Reported on 02/20/2020) 30 g 3  . nystatin (MYCOSTATIN) 100000 UNIT/ML suspension Take 5 mLs (500,000 Units total) by mouth 4 (four) times daily. (Patient not taking: Reported on 02/20/2020) 473 mL 0  . oxyCODONE (ROXICODONE) 5 MG immediate release tablet Take 1 tablet (5 mg total) by mouth every 12 (twelve) hours as needed for moderate pain, severe pain or breakthrough pain. (Patient not taking: Reported on 04/05/2020) 30 tablet 0   No current facility-administered  medications for this visit.   Facility-Administered Medications Ordered in Other Visits  Medication Dose Route Frequency Provider Last Rate Last Admin  . sodium chloride flush (NS) 0.9 % injection 10 mL  10 mL Intravenous PRN Earlie Server, MD   10 mL at 04/06/20 0755     PHYSICAL EXAMINATION: ECOG PERFORMANCE STATUS: 1 - Symptomatic but completely ambulatory Vitals:   04/06/20 0838  BP: 125/83  Pulse: 87  Resp: 18  Temp: (!) 95.6 F (35.3 C)   Physical Exam  Constitutional: He is oriented to person, place, and time. No distress.  HENT:  Head: Normocephalic and atraumatic.  Mouth/Throat: No oropharyngeal exudate.  No thrush, dry oral mucosa  Eyes: Pupils are equal, round, and reactive to light. EOM are normal. No scleral icterus.  Cardiovascular: Normal rate and regular rhythm.  No murmur heard. Pulmonary/Chest: Effort normal. No respiratory distress. He has no rales. He exhibits no tenderness.  Abdominal: Soft. He exhibits no distension. There is no abdominal tenderness.  Musculoskeletal:        General: No edema. Normal range of motion.     Cervical back: Normal range of motion and neck supple.  Neurological: He is alert and oriented to person, place, and time. No cranial nerve deficit. He exhibits normal muscle tone. Coordination normal.  Skin: Skin is warm and dry. He is not diaphoretic. No erythema.  Psychiatric: Affect normal.     LABORATORY DATA:  I have reviewed the data as listed Lab Results  Component Value Date   WBC 5.2 03/15/2020   HGB 14.1 03/15/2020   HCT 41.8 03/15/2020   MCV 96.8 03/15/2020   PLT 235 03/15/2020   Recent Labs    02/02/20 0911 02/23/20 0831 03/15/20 0845  NA 136 137 137  K 4.0 4.0 4.1  CL 104 105 106  CO2 _0 GLUCOSE 106* 109* 93  BUN 23 35* 23  CREATININE 1.30* 1.34* 1.21  CALCIUM 9.3 9.1 9.1  GFRNONAA 58* 56* >60  GFRAA >60 >60 >60  PROT 6.8 6.8 6.9  ALBUMIN 3.7 3.9 3.7  AST _1 ALT _2 ALKPHOS 100 84 76   BILITOT 0.6 0.8 1.0  RADIOGRAPHIC STUDIES: I have personally reviewed the radiological images as listed and agreed with the findings in the report. NM PET Image Restag (PS) Skull Base To Thigh  Result Date: 01/22/2020 CLINICAL DATA:  Subsequent treatment strategy for head neck cancer. EXAM: NUCLEAR MEDICINE PET SKULL BASE TO THIGH TECHNIQUE: 10.5 mCi F-18 FDG was injected intravenously. Full-ring PET imaging was performed from the skull base to thigh after  the radiotracer. CT data was obtained and used for attenuation correction and anatomic localization. Fasting blood glucose: 90 mg/dl COMPARISON:  12/18/2019 FINDINGS: Mediastinal blood pool activity: SUV max 2.2 Liver activity: SUV max NA NECK: Similar asymmetric uptake along the left focal cord. Index hypermetabolic right level III lymph node identified on the previous study shows stable to slight decrease in hypermetabolism with SUV max = 3.2 today compared to 4.9 previously. This ill-defined node is visible on 53/3 today with stable 8 mm short axis measurement. Incidental CT findings: none CHEST: Previously described pretracheal node seen on image 71/3 today has decreased slightly in size in the interval measuring 10 mm short axis today compared to 12 mm previously. SUV max = 4.1 today compared to 7.5 previously. Hypermetabolic high right paratracheal node identified previously is stable in size at 9 mm (64/3) with SUV max = 2.8 today compared to 4.3 previously. Right upper lobe pulmonary nodule has decreased in size in the interval measuring 10 mm today compared to 15 mm previously. SUV max = 2.5 today compared to 8.1 previously. Incidental CT findings: Right Port-A-Cath tip is positioned at the SVC/RA junction. Coronary artery calcification is evident. Thoracic aortic atherosclerosis evident. ABDOMEN/PELVIS: No abnormal hypermetabolic activity within the liver, pancreas, adrenal glands, or spleen. No hypermetabolic lymph nodes in the abdomen or pelvis.  Incidental CT findings: Cysts are again noted in the left liver and right kidney. Diffuse diverticular disease noted in the colon without evidence of diverticulitis. Abdominal aortic atherosclerosis. SKELETON: Diffuse FDG accumulation in the marrow space likely related to stimulatory effects of therapy. Incidental CT findings: No worrisome lytic or sclerotic osseous abnormality. IMPRESSION: 1. The small hypermetabolic right-sided level III cervical node measures similar size today with slight decrease in hypermetabolism. 2. The previously identified pretracheal and high right paratracheal hypermetabolic lymph nodes identified previously are stable to decreased in size in the interval with both showing decreased hypermetabolism. 3. Hypermetabolic right upper lobe pulmonary nodule is both decreased in size and hypermetabolic FDG uptake. 4. No new sites of hypermetabolic soft tissue disease in the neck, chest, abdomen, or pelvis to raise concern for disease progression. Electronically Signed   By: Misty Stanley M.D.   On: 01/22/2020 10:28     ASSESSMENT & PLAN:   1. 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[place])   2. Encounter for antineoplastic immunotherapy   3. 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  Labs reviewed and discussed with patient today. Proceed with Keytruda treatments.#Aching sensation at lower neck/upper middle chest, etiology unclear. I will obtain PET scan to further evaluate his disease status #Elevated TSH, patient is on immunotherapy.  Possible immunotherapy toxicities.  Check free T4  Stage 3a CKD, creatinine stable.  Avoid nephrotoxins. Chronic lymphocytopenia, from previous chemotherapy.  Continue to monitor all questions were answered. The patient knows to call the clinic with any  problems questions or concerns.  Follow up in 3 weeks.    Earlie Server, MD, PhD Hematology Oncology [place] at [facility] 04/06/2020

## 2020-04-20 NOTE — Progress Notes (Signed)
Pharmacist Chemotherapy Monitoring - Follow Up Assessment    I verify that I have reviewed each item in the below checklist:  . Regimen for the patient is scheduled for the appropriate day and plan matches scheduled date. Marland Kitchen Appropriate non-routine labs are ordered dependent on drug ordered. . If applicable, additional medications reviewed and ordered per protocol based on lifetime cumulative doses and/or treatment regimen.   Plan for follow-up and/or issues identified: No . I-vent associated with next due treatment: No . MD and/or nursing notified: No  Ashle Stief K 04/20/2020 8:38 AM

## 2020-04-26 ENCOUNTER — Other Ambulatory Visit: Payer: Self-pay

## 2020-04-26 ENCOUNTER — Ambulatory Visit
Admission: RE | Admit: 2020-04-26 | Discharge: 2020-04-26 | Disposition: A | Discharge status: Home / Self Care | Payer: 59 | Source: Ambulatory Visit | Attending: Oncology | Admitting: Oncology

## 2020-04-26 ENCOUNTER — Encounter: Payer: Self-pay | Admitting: Oncology

## 2020-04-26 DIAGNOSIS — C109 Malignant neoplasm of oropharynx, unspecified: Secondary | ICD-10-CM | POA: Insufficient documentation

## 2020-04-26 LAB — GLUCOSE, CAPILLARY: Glucose-Capillary: 86 mg/dL (ref 70–99)

## 2020-04-26 IMAGING — CT NM PET TUM IMG RESTAG (PS) SKULL BASE T - THIGH
1 of 10 series · 1 of 25 positions shown · non-contrast
Comparison: Multiple prior studies most recent from [DATE]
COMPARISON: Multiple prior studies most recent from [DATE]

Addendum:
CLINICAL DATA: Subsequent treatment strategy for head neck cancer.

EXAM:
NUCLEAR MEDICINE PET SKULL BASE TO THIGH
TECHNIQUE: 10.68 mCi F-18 FDG was injected intravenously. Full-ring PET imaging
was performed from the skull base to thigh after the radiotracer. CT
data was obtained and used for attenuation correction and anatomic
localization.
Fasting blood glucose: 86 mg/dl

[Series 3: ct wb 5.0 b30f · axial · 5.0mm · 0.98mm/px · 1 of 329 slices shown]
[im 329/329  brain]
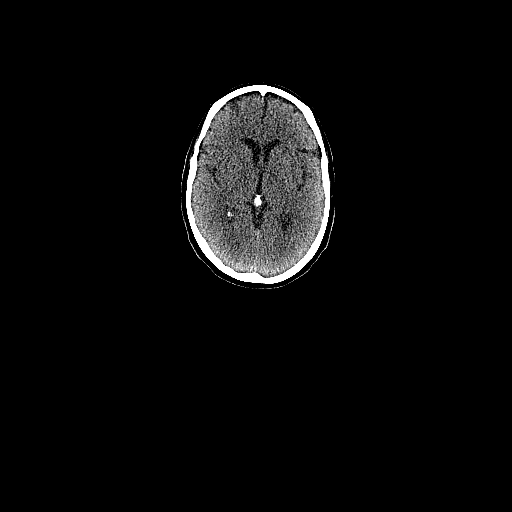

[1 of 25 positions shown; findings below may reference images not displayed]

FINDINGS: Mediastinal blood pool activity: SUV max

Liver activity: SUV max NA

NECK: No hypermetabolic activity aside from a high RIGHT
paratracheal lymph node just at or below the thoracic inlet, shown
to be enlarge in [DATE] but with resolution on more
recent exams. See below. The RIGHT level III lymph node seen on the
prior study is unchanged posterior to the sternocleidomastoid
musculature not well-defined and with some artifact in the area that
makes assessment of metabolic activity difficult.

Diminished activity associated with the LEFT vocal cord.

Incidental CT findings: None

CHEST: High RIGHT paratracheal lymph node is enlarged 2.0 x 1.8 cm
(SUVmax = 12.9) previously 2.8 and with only minimal enlargement
(image 71, series 3)

Two adjacent small RIGHT paratracheal lymph nodes approximately 9 mm
short axis on images 84-88. (SUVmax = 6.2) previously no
hypermetabolic activity in this location or enlarged nodal tissue.

RIGHT upper lobe spiculated nodule along the fissure measuring
approximately 12 mm within 1 mm of prior measurement (image 109,
series 3) (SUVmax = 4.2) previously

RIGHT hilar activity (image 97, series 3) likely a juxta hilar lymph
node actually seen as a discrete finding potentially on image 101
measuring 9 mm (SUVmax = 8.3) no activity seen in this location on
the most recent prior

Anterior mediastinal activity is resolved. Soft tissue is not
well-defined, less defined in the on the previous study.

Incidental CT findings: Calcified coronary artery disease and
calcified atherosclerotic changes similar to the prior study. Heart
size is stable without pericardial fluid.

ABDOMEN/PELVIS: Heterogeneous hepatic uptake. No visible lesion on
CT. Hepatic cyst in the LEFT hepatic lobe similar to the prior
study. No suspicious focal area of increased uptake in the abdomen
or in the pelvis.

Incidental CT findings: Calcified atherosclerosis of the abdominal
aorta. No aneurysmal dilation. No acute gastrointestinal process
with colonic diverticulosis. Stable presumed hepatic cyst.

SKELETON: Increased mandibular activity on the RIGHT with potential
interval extraction of RIGHT mandibular third molar. Diminished
diffuse marrow activity that was seen on the previous study. No
focal hypermetabolic area to suggest skeletal metastasis.

Incidental CT findings: none
IMPRESSION: 1. Worsening of disease in the chest as described above. Interval
enlargement of RIGHT thoracic inlet and paratracheal lymph nodes
that were previously enlarged with new activity in the RIGHT hilum
corresponding to a lymph node in this location. Also with increased
activity in the RIGHT upper lobe nodule.
2. RIGHT level III lymph node in the neck and anterior mediastinal
nodal activity is diminished, these areas were quite small on the
prior study.
3. Diminished asymmetry of uptake within the LEFT as compared to the
RIGHT vocal cord, attention on follow-up.

ADDENDUM:
Increased activity about the RIGHT mandible in the area of the third
molar. Correlate with extraction or any signs of increasing pain
that would indicate odontogenic infection or worsening dental
caries. Area with limited assessment due to streak artifact. Some of
this could also be related to artifact.

These results will be called to the ordering clinician or
representative by the Radiologist Assistant, and communication
documented in the PACS or [REDACTED].

*** End of Addendum ***
FINDINGS: Mediastinal blood pool activity: SUV max

Liver activity: SUV max NA

NECK: No hypermetabolic activity aside from a high RIGHT
paratracheal lymph node just at or below the thoracic inlet, shown
to be enlarge in [DATE] but with resolution on more
recent exams. See below. The RIGHT level III lymph node seen on the
prior study is unchanged posterior to the sternocleidomastoid
musculature not well-defined and with some artifact in the area that
makes assessment of metabolic activity difficult.

Diminished activity associated with the LEFT vocal cord.

Incidental CT findings: None

CHEST: High RIGHT paratracheal lymph node is enlarged 2.0 x 1.8 cm
(SUVmax = 12.9) previously 2.8 and with only minimal enlargement
(image 71, series 3)

Two adjacent small RIGHT paratracheal lymph nodes approximately 9 mm
short axis on images 84-88. (SUVmax = 6.2) previously no
hypermetabolic activity in this location or enlarged nodal tissue.

RIGHT upper lobe spiculated nodule along the fissure measuring
approximately 12 mm within 1 mm of prior measurement (image 109,
series 3) (SUVmax = 4.2) previously

RIGHT hilar activity (image 97, series 3) likely a juxta hilar lymph
node actually seen as a discrete finding potentially on image 101
measuring 9 mm (SUVmax = 8.3) no activity seen in this location on
the most recent prior

Anterior mediastinal activity is resolved. Soft tissue is not
well-defined, less defined in the on the previous study.

Incidental CT findings: Calcified coronary artery disease and
calcified atherosclerotic changes similar to the prior study. Heart
size is stable without pericardial fluid.

ABDOMEN/PELVIS: Heterogeneous hepatic uptake. No visible lesion on
CT. Hepatic cyst in the LEFT hepatic lobe similar to the prior
study. No suspicious focal area of increased uptake in the abdomen
or in the pelvis.

Incidental CT findings: Calcified atherosclerosis of the abdominal
aorta. No aneurysmal dilation. No acute gastrointestinal process
with colonic diverticulosis. Stable presumed hepatic cyst.

SKELETON: Increased mandibular activity on the RIGHT with potential
interval extraction of RIGHT mandibular third molar. Diminished
diffuse marrow activity that was seen on the previous study. No
focal hypermetabolic area to suggest skeletal metastasis.

Incidental CT findings: none
IMPRESSION: 1. Worsening of disease in the chest as described above. Interval
enlargement of RIGHT thoracic inlet and paratracheal lymph nodes
that were previously enlarged with new activity in the RIGHT hilum
corresponding to a lymph node in this location. Also with increased
activity in the RIGHT upper lobe nodule.
2. RIGHT level III lymph node in the neck and anterior mediastinal
nodal activity is diminished, these areas were quite small on the
prior study.
3. Diminished asymmetry of uptake within the LEFT as compared to the
RIGHT vocal cord, attention on follow-up.

## 2020-04-26 MED ORDER — FLUDEOXYGLUCOSE F - 18 (FDG) INJECTION
10.0000 | Freq: Once | INTRAVENOUS | Status: AC | PRN
  Administered 2020-04-26: 10.68 via INTRAVENOUS

## 2020-04-26 NOTE — Progress Notes (Signed)
Patient called assessed/ pre- screened for  appoinment with oncologist. No concerns or complaints.

## 2020-04-27 ENCOUNTER — Inpatient Hospital Stay: Payer: 59

## 2020-04-27 ENCOUNTER — Other Ambulatory Visit: Payer: Self-pay

## 2020-04-27 ENCOUNTER — Inpatient Hospital Stay (HOSPITAL_BASED_OUTPATIENT_CLINIC_OR_DEPARTMENT_OTHER): Payer: 59 | Admitting: Oncology

## 2020-04-27 ENCOUNTER — Telehealth: Payer: Self-pay | Admitting: *Deleted

## 2020-04-27 ENCOUNTER — Encounter: Payer: Self-pay | Admitting: Oncology

## 2020-04-27 VITALS — BP 132/85 | HR 85 | Temp 96.3°F | Wt 195.9 lb

## 2020-04-27 DIAGNOSIS — C109 Malignant neoplasm of oropharynx, unspecified: Secondary | ICD-10-CM | POA: Diagnosis not present

## 2020-04-27 DIAGNOSIS — Z7189 Other specified counseling: Secondary | ICD-10-CM

## 2020-04-27 DIAGNOSIS — Z95828 Presence of other vascular implants and grafts: Secondary | ICD-10-CM

## 2020-04-27 DIAGNOSIS — Z5112 Encounter for antineoplastic immunotherapy: Secondary | ICD-10-CM | POA: Diagnosis not present

## 2020-04-27 MED ORDER — SODIUM CHLORIDE 0.9% FLUSH
10.0000 mL | INTRAVENOUS | Status: AC | PRN
  Filled 2020-04-27: qty 10

## 2020-04-27 NOTE — Telephone Encounter (Signed)
Called addended report  ADDENDUM REPORT: 04/26/2020 17:41  ADDENDUM: Increased activity about the RIGHT mandible in the area of the third molar. Correlate with extraction or any signs of increasing pain that would indicate odontogenic infection or worsening dental caries. Area with limited assessment due to streak artifact. Some of this could also be related to artifact.  These results will be called to the ordering clinician or representative by the Radiologist Assistant, and communication documented in the PACS or [employer].   Electronically Signed   By: Zetta Bills M.D.   On: 04/26/2020 17:41

## 2020-04-27 NOTE — Progress Notes (Signed)
DISCONTINUE ON PATHWAY REGIMEN - Head and Neck     A cycle is every 21 days:     Carboplatin      Fluorouracil      Pembrolizumab   **Always confirm dose/schedule in your pharmacy ordering system**  REASON: Disease Progression PRIOR TREATMENT: [id number]: Pembrolizumab 200 mg D1 + Carboplatin AUC=5 D1 + Fluorouracil 1,000 mg/m2/day CIV D1-4 q21 Days x 6 Cycles, then Pembrolizumab 200 mg q21 Days for up to a total of 24 Months TREATMENT RESPONSE: Progressive Disease (PD)  START ON PATHWAY REGIMEN - Head and Neck     Administer week 1:     Cetuximab    Administer weekly after initial loading dose:     Cetuximab   **Always confirm dose/schedule in your pharmacy ordering system**  Patient Characteristics: Oropharynx, HPV Positive, Metastatic, Second Line Disease Classification: Oropharynx HPV Status: Positive (+) AJCC N Category: NX AJCC 8 Stage Grouping: IV Current Disease Status: Metastatic Disease AJCC T Category: T1 AJCC M Category: M1 Line of Therapy: Second Line  Intent of Therapy: Non-Curative / Palliative Intent, Discussed with Patient

## 2020-04-27 NOTE — Progress Notes (Signed)
Hematology/Oncology  Follow Up note [facility] [phone] [phone]   Patient Care Team: Albina Billet, MD as PCP - General (Internal Medicine) Albina Billet, MD (Internal Medicine) Bary Castilla, Forest Gleason, MD (General Surgery) Noreene Filbert, MD as Referring Physician (Radiation Oncology) Earlie Server, MD as Consulting Physician (Oncology)  REFERRING PROVIDER: Dr.McQueen CHIEF COMPLAINTS/PURPOSE OF CONSULTATION:  Follow up for head and neck cancer.  HISTORY OF PRESENTING ILLNESS:  Ivan Garcia. is a  64 y.o.  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 P16 positive. cT2 cN2 disease, stage II, Concurrent ChemoRT  Cisplatin 100 mg/m2 q3weeks with concurrent RT which started on 12/31/2017.  S/p one dose of Cisplatin. Cisplatin discontinued due to nephrotoxicity. Switch to weekly Carboplatin (AUC 2) / Taxol 1m/m2 [ finished May 2019] Patient has finished concurrent chemoradiation in May 2019  # He reports doing well until in July 2020, he started to have hoarseness after singing for 2 to 3 hours. He has changed his insurance and Dr. MTami Ribasis no longer covered by his current insurance.  So he establish care with Dr.Yalanda Soderman KJuliann Pulseat DAkutanand was seen on 08/04/2019. Flexible laryngoscopy showed Vocal cord paralysis.  Patient was recommended to have a PET scan done for restaging. Patient's primary care provider Dr. THall Busingordered a PET scan for patient which was done on 08/20/2019.  PET scan unfortunately showed metastatic disease.  Patient called cancer center and is scheduled follow-up with me. 08/20/2019 PET scan showed asymmetric hypermetabolic him involving the left vocal cords with hypermetabolic adenopathy in the neck, and the chest.  He also has hypermetabolic pulmonary nodule along the minor fissure which is felt  to be metastatic. Tiny proximal left ureteral stone without hydronephrosis.  Possible gallbladder sludge.  Enlarged prostate.  Atherosclerosis.  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  # NGS showed PD-L1 CPS 20, TMB indeterminant, MSI stable,PIK3CA mutation  INTERVAL HISTORY Ivan Garcia is a 63y.o. male who presents for follow-up for metastatic squamous cell carcinoma, P 16+.   He is s/p 6 cycles of carboplatin, 5-FU and Keytruda.  And has been on Keytruda maintenance. Patient had interim PET scan done and present to discuss results and management. Patient has no new complaints.  He has had some chronic mid chest discomforts he describes as aches.  No swallowing difficulty, breathing difficulties. Review of Systems  Constitutional: Negative for chills, fever, malaise/fatigue and weight loss.  HENT: Negative for sore throat.   Eyes: Negative for redness.  Respiratory: Negative for cough, shortness of breath and wheezing.   Cardiovascular: Negative for chest pain, palpitations and leg swelling.  Gastrointestinal: Negative for abdominal pain, blood in stool, nausea and vomiting.  Genitourinary: Negative for dysuria.  Musculoskeletal: Negative for myalgias.  Skin: Negative for rash.  Neurological: Negative for dizziness, tingling and tremors.  Endo/Heme/Allergies: Does not bruise/bleed easily.  Psychiatric/Behavioral: Negative for hallucinations.    MEDICAL HISTORY:  Past Medical History:  Diagnosis Date  . Arthritis   . Cancer ([place]    Head and neck cancer  . Hemorrhoids   . Hyperlipidemia   . Hypertension     SURGICAL HISTORY: Past Surgical History:  Procedure Laterality Date  . COLONOSCOPY WITH PROPOFOL N/A 04/04/2017   Procedure: COLONOSCOPY WITH PROPOFOL;  Surgeon: JRobert Bellow MD;  Location: [facility] ENDOSCOPY;  Service: Endoscopy;  Laterality: N/A;  . MANDIBLE SURGERY    .  PORT-A-CATH REMOVAL  07/17/2018  . PORTA CATH INSERTION N/A 12/19/2017   Procedure: PORTA CATH INSERTION;  Surgeon: Algernon Huxley, MD;  Location: [place] [facility];  Service: Cardiovascular;  Laterality: N/A;  . PORTA CATH INSERTION N/A 07/17/2018   Procedure: PORTA CATH INSERTION;  Surgeon: Algernon Huxley, MD;  Location: [place] [facility];  Service: Cardiovascular;  Laterality: N/A;  . PORTA CATH INSERTION N/A 09/18/2019   Procedure: PORTA CATH INSERTION;  Surgeon: Algernon Huxley, MD;  Location: [place] [facility];  Service: Cardiovascular;  Laterality: N/A;  . TONSILLECTOMY      SOCIAL HISTORY: Social History   Socioeconomic History  . Marital status: Single    Spouse name: Not on file  . Number of children: Not on file  . Years of education: Not on file  . Highest education level: Not on file  Occupational History  . Not on file  Tobacco Use  . Smoking status: Never Smoker  . Smokeless tobacco: Never Used  Substance and Sexual Activity  . Alcohol use: No  . Drug use: No  . Sexual activity: Not on file  Other Topics Concern  . Not on file  Social History Narrative  . Not on file   Social Determinants of Health   Financial Resource Strain:   . Difficulty of Paying Living Expenses:   Food Insecurity:   . Worried About Charity fundraiser in the Last Year:   . Arboriculturist in the Last Year:   Transportation Needs:   . Film/video editor (Medical):   Marland Kitchen Lack of Transportation (Non-Medical):   Physical Activity:   . Days of Exercise per Week:   . Minutes of Exercise per Session:   Stress:   . Feeling of Stress :   Social Connections:   . Frequency of Communication with Friends and Family:   . Frequency of Social Gatherings with Friends and Family:   . Attends Religious Services:   . Active Member of Clubs or Organizations:   . Attends Archivist Meetings:   Marland Kitchen Marital Status:   Intimate Partner Violence:   . Fear of Current or Ex-Partner:   .  Emotionally Abused:   Marland Kitchen Physically Abused:   . Sexually Abused:     FAMILY HISTORY: Family History  Problem Relation Age of Onset  . Breast cancer Mother   . Asthma Mother   . Congestive Heart Failure Mother   . Prostate cancer Father   . Brain cancer Father   . Bladder Cancer Father     ALLERGIES:  has No Known Allergies.  MEDICATIONS:  Current Outpatient Medications  Medication Sig Dispense Refill  . amLODipine (NORVASC) 5 MG tablet Take 5 mg by mouth daily.    Marland Kitchen amoxicillin (AMOXIL) 500 MG capsule TAKE 2 CAPSULES BY MOUTH NOW, THEN 1 CAPSULE FOUR TIMES DAILY THEREAFTER    . lidocaine (XYLOCAINE) 2 % solution     . lidocaine-prilocaine (EMLA) cream Apply to affected area once 30 g 3  . Multiple Vitamins-Minerals (MULTIVITAMIN WITH MINERALS) tablet Take 1 tablet by mouth daily.    Marland Kitchen omeprazole (PRILOSEC) 20 MG capsule TAKE 1 CAPSULE BY MOUTH EVERY DAY 90 capsule 1  . ondansetron (ZOFRAN) 8 MG tablet Take 1 tablet (8 mg total) by mouth 2 (two) times daily as needed for refractory nausea / vomiting. Start on day  3 after carboplatin chemo. 30 tablet 1  . prochlorperazine (COMPAZINE) 10 MG tablet Take 1 tablet (10 mg total) by mouth every 6 (six) hours as needed (Nausea or vomiting). 30 tablet 1  . rosuvastatin (CRESTOR) 10 MG tablet Take 10 mg by mouth every evening.  4  . nystatin (MYCOSTATIN) 100000 UNIT/ML suspension Take 5 mLs (500,000 Units total) by mouth 4 (four) times daily. (Patient not taking: Reported on 02/20/2020) 473 mL 0  . oxyCODONE (ROXICODONE) 5 MG immediate release tablet Take 1 tablet (5 mg total) by mouth every 12 (twelve) hours as needed for moderate pain, severe pain or breakthrough pain. (Patient not taking: Reported on 04/05/2020) 30 tablet 0   No current facility-administered medications for this visit.   Facility-Administered Medications Ordered in Other Visits  Medication Dose Route Frequency Provider Last Rate Last Admin  . sodium chloride flush (NS) 0.9 %  injection 10 mL  10 mL Intravenous PRN Earlie Server, MD         PHYSICAL EXAMINATION: ECOG PERFORMANCE STATUS: 1 - Symptomatic but completely ambulatory Vitals:   04/26/20 1612  BP: 132/85  Pulse: 85  Temp: (!) 96.3 F (35.7 C)   Physical Exam  Constitutional: He is oriented to person, place, and time. No distress.  HENT:  Head: Normocephalic and atraumatic.  Nose: Nose normal.  Mouth/Throat: Oropharynx is clear and moist. No oropharyngeal exudate.  No thrush, dry oral mucosa  Eyes: Pupils are equal, round, and reactive to light. EOM are normal. No scleral icterus.  Cardiovascular: Normal rate and regular rhythm.  No murmur heard. Pulmonary/Chest: Effort normal. No respiratory distress. He has no rales. He exhibits no tenderness.  Abdominal: Soft. He exhibits no distension. There is no abdominal tenderness.  Musculoskeletal:        General: No edema. Normal range of motion.     Cervical back: Normal range of motion and neck supple.  Neurological: He is alert and oriented to person, place, and time. No cranial nerve deficit. He exhibits normal muscle tone. Coordination normal.  Skin: Skin is warm and dry. He is not diaphoretic. No erythema.  Psychiatric: Affect normal.     LABORATORY DATA:  I have reviewed the data as listed Lab Results  Component Value Date   WBC 5.8 04/06/2020   HGB 13.5 04/06/2020   HCT 39.8 04/06/2020   MCV 93.6 04/06/2020   PLT 285 04/06/2020   Recent Labs    02/23/20 0831 03/15/20 0845 04/06/20 0749  NA 137 137 137  K 4.0 4.1 4.2  CL 105 106 103  CO2 _0 GLUCOSE 109* 93 117*  BUN 35* 23 22  CREATININE 1.34* 1.21 1.29*  CALCIUM 9.1 9.1 9.2  GFRNONAA 56* >60 58*  GFRAA >60 >60 >60  PROT 6.8 6.9 7.0  ALBUMIN 3.9 3.7 3.6  AST _1 ALT _2 ALKPHOS 84 76 62  BILITOT 0.8 1.0 0.8  RADIOGRAPHIC STUDIES: I have personally reviewed the radiological images as listed and agreed with the findings in the report. NM PET Image  Restag (PS) Skull Base To Thigh  Addendum Date: 04/26/2020   ADDENDUM REPORT: 04/26/2020 17:41 ADDENDUM: Increased activity about the RIGHT mandible in the area of the third molar. Correlate with extraction or any signs of increasing pain that would indicate odontogenic infection or worsening dental caries. Area with limited assessment due to streak artifact. Some of this could also be related to artifact. These results will be called to  the ordering clinician or representative by the Radiologist Assistant, and communication documented in the PACS or [employer]. Electronically Signed   By: Zetta Bills M.D.   On: 04/26/2020 17:41   Result Date: 04/26/2020 CLINICAL DATA:  Subsequent treatment strategy for head neck cancer. EXAM: NUCLEAR MEDICINE PET SKULL BASE TO THIGH TECHNIQUE: 10.68 mCi F-18 FDG was injected intravenously. Full-ring PET imaging was performed from the skull base to thigh after the radiotracer. CT data was obtained and used for attenuation correction and anatomic localization. Fasting blood glucose: 86 mg/dl COMPARISON:  Multiple prior studies most recent from January 22, 2020 FINDINGS: Mediastinal blood pool activity: SUV max 2.1 Liver activity: SUV max NA NECK: No hypermetabolic activity aside from a high RIGHT paratracheal lymph node just at or below the thoracic inlet, shown to be enlarge in September of 2020 but with resolution on more recent exams. See below. The RIGHT level III lymph node seen on the prior study is unchanged posterior to the sternocleidomastoid musculature not well-defined and with some artifact in the area that makes assessment of metabolic activity difficult. Diminished activity associated with the LEFT vocal cord. Incidental CT findings: None CHEST: High RIGHT paratracheal lymph node is enlarged 2.0 x 1.8 cm (SUVmax = 12.9) previously 2.8 and with only minimal enlargement (image 71, series 3) Two adjacent small RIGHT paratracheal lymph nodes approximately 9 mm  short axis on images 84-88. (SUVmax = 6.2) previously no hypermetabolic activity in this location or enlarged nodal tissue. RIGHT upper lobe spiculated nodule along the fissure measuring approximately 12 mm within 1 mm of prior measurement (image 109, series 3) (SUVmax = 4.2) previously 2.5 RIGHT hilar activity (image 97, series 3) likely a juxta hilar lymph node actually seen as a discrete finding potentially on image 101 measuring 9 mm (SUVmax = 8.3) no activity seen in this location on the most recent prior Anterior mediastinal activity is resolved. Soft tissue is not well-defined, less defined in the on the previous study. Incidental CT findings: Calcified coronary artery disease and calcified atherosclerotic changes similar to the prior study. Heart size is stable without pericardial fluid. ABDOMEN/PELVIS: Heterogeneous hepatic uptake. No visible lesion on CT. Hepatic cyst in the LEFT hepatic lobe similar to the prior study. No suspicious focal area of increased uptake in the abdomen or in the pelvis. Incidental CT findings: Calcified atherosclerosis of the abdominal aorta. No aneurysmal dilation. No acute gastrointestinal process with colonic diverticulosis. Stable presumed hepatic cyst. SKELETON: Increased mandibular activity on the RIGHT with potential interval extraction of RIGHT mandibular third molar. Diminished diffuse marrow activity that was seen on the previous study. No focal hypermetabolic area to suggest skeletal metastasis. Incidental CT findings: none IMPRESSION: 1. Worsening of disease in the chest as described above. Interval enlargement of RIGHT thoracic inlet and paratracheal lymph nodes that were previously enlarged with new activity in the RIGHT hilum corresponding to a lymph node in this location. Also with increased activity in the RIGHT upper lobe nodule. 2. RIGHT level III lymph node in the neck and anterior mediastinal nodal activity is diminished, these areas were quite small on the  prior study. 3. Diminished asymmetry of uptake within the LEFT as compared to the RIGHT vocal cord, attention on follow-up. Electronically Signed: By: Zetta Bills M.D. On: 04/26/2020 11:37     ASSESSMENT & PLAN:   1. 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[place])   2. Encounter for antineoplastic immunotherapy    #Metastatic squamous cell carcinoma of oropharynx- cervical lymphadenopathy, lung  metastatic disease.PD-L1 CPS 20 S/p 6 cycles Keytruda/carboplatin/5-FU day 1-4 treatments-finished in February 2021 Patient has been on immunotherapy Keytruda for maintenance PET scan was independently reviewed by me and discussed with patient. 04/26/2020 worsening of disease in the chest.  Interval enlargement of the right thoracic inlet and paratracheal lymph nodes that were previously enlarged with new activity in the right hilum corresponding to a lymph node in this location.  Also increased activity in the right upper lobe nodule. Right level 3 lymph node in the neck and anterior mediastinal nodal activity is diminished.  These areas are quite small on prior study. Diminished symmetric uptake within the left as compared to the right vocal cord. Right mandible increased activity in the area of third molar.  This is consistent with his recent tooth infection.  Image results were discussed with patient.  I am concerned that cancer has progressed, 3 months after discontinuation of carboplatin and 5-FU.  Patient has been remained on Keytruda treatments. Discussed with patient that he most likely have chemo resistant disease. I recommend subsequent treatments with cetuximab or Taxol treatments.  I will check alpha gal level. I also encourage patient to get a second opinion at [facility].  Patient previously was seen there. Patient will consider and update me his decision after seeing [facility].  Hold off Keytruda treatments today.  The patient knows to call the clinic with any problems questions or concerns. Follow  up in 3 weeks.    Earlie Server, MD, PhD Hematology Oncology [place] [facility] 04/27/2020

## 2020-04-30 LAB — MISC LABCORP TEST (SEND OUT): Labcorp test code: 806562

## 2020-05-07 ENCOUNTER — Telehealth: Payer: Self-pay

## 2020-05-07 NOTE — Telephone Encounter (Signed)
Please schedule patient for lab/MD/Keytruda one day next week. Thanks

## 2020-05-07 NOTE — Telephone Encounter (Addendum)
Done....  Pt has been scheduled as requested for [facility] lab/ MD/ Keytruda.  Per pt request to come on 05/14/20

## 2020-05-14 ENCOUNTER — Inpatient Hospital Stay (HOSPITAL_BASED_OUTPATIENT_CLINIC_OR_DEPARTMENT_OTHER): Payer: Medicare Other | Admitting: Oncology

## 2020-05-14 ENCOUNTER — Other Ambulatory Visit: Payer: Self-pay

## 2020-05-14 ENCOUNTER — Inpatient Hospital Stay: Payer: Medicare Other

## 2020-05-14 ENCOUNTER — Encounter: Payer: Self-pay | Admitting: Oncology

## 2020-05-14 ENCOUNTER — Inpatient Hospital Stay: Payer: Medicare Other | Attending: Oncology

## 2020-05-14 VITALS — BP 127/84 | HR 85 | Temp 96.1°F | Resp 18 | Wt 200.1 lb

## 2020-05-14 DIAGNOSIS — K219 Gastro-esophageal reflux disease without esophagitis: Secondary | ICD-10-CM

## 2020-05-14 DIAGNOSIS — C109 Malignant neoplasm of oropharynx, unspecified: Secondary | ICD-10-CM | POA: Diagnosis present

## 2020-05-14 DIAGNOSIS — Z7189 Other specified counseling: Secondary | ICD-10-CM | POA: Diagnosis not present

## 2020-05-14 DIAGNOSIS — Z5112 Encounter for antineoplastic immunotherapy: Secondary | ICD-10-CM

## 2020-05-14 DIAGNOSIS — Z95828 Presence of other vascular implants and grafts: Secondary | ICD-10-CM

## 2020-05-14 LAB — COMPREHENSIVE METABOLIC PANEL
ALT: 21 U/L (ref 0–44)
AST: 18 U/L (ref 15–41)
Albumin: 3.9 g/dL (ref 3.5–5.0)
Alkaline Phosphatase: 79 U/L (ref 38–126)
Anion gap: 8 (ref 5–15)
BUN: 30 mg/dL — ABNORMAL HIGH (ref 8–23)
CO2: 25 mmol/L (ref 22–32)
Calcium: 9 mg/dL (ref 8.9–10.3)
Chloride: 107 mmol/L (ref 98–111)
Creatinine, Ser: 1.31 mg/dL — ABNORMAL HIGH (ref 0.61–1.24)
GFR calc Af Amer: >60 mL/min (ref >60)
GFR calc non Af Amer: 57 mL/min — ABNORMAL LOW (ref >60)
Glucose, Bld: 104 mg/dL — ABNORMAL HIGH (ref 70–99)
Potassium: 4.2 mmol/L (ref 3.5–5.1)
Sodium: 140 mmol/L (ref 135–145)
Total Bilirubin: 0.9 mg/dL (ref 0.3–1.2)
Total Protein: 7 g/dL (ref 6.5–8.1)

## 2020-05-14 LAB — CBC WITH DIFFERENTIAL/PLATELET
Abs Immature Granulocytes: 0.01 10*3/uL (ref 0.00–0.07)
Basophils Absolute: 0.1 10*3/uL (ref 0.0–0.1)
Basophils Relative: 1 %
Eosinophils Absolute: 0.2 10*3/uL (ref 0.0–0.5)
Eosinophils Relative: 4 %
HCT: 41.2 % (ref 39.0–52.0)
Hemoglobin: 13.9 g/dL (ref 13.0–17.0)
Immature Granulocytes: 0 %
Lymphocytes Relative: 12 %
Lymphs Abs: 0.8 10*3/uL (ref 0.7–4.0)
MCH: 30.3 pg (ref 26.0–34.0)
MCHC: 33.7 g/dL (ref 30.0–36.0)
MCV: 90 fL (ref 80.0–100.0)
Monocytes Absolute: 0.6 10*3/uL (ref 0.1–1.0)
Monocytes Relative: 9 %
Neutro Abs: 4.8 10*3/uL (ref 1.7–7.7)
Neutrophils Relative %: 74 %
Platelets: 219 10*3/uL (ref 150–400)
RBC: 4.58 MIL/uL (ref 4.22–5.81)
RDW: 13 % (ref 11.5–15.5)
WBC: 6.4 10*3/uL (ref 4.0–10.5)
nRBC: 0 % (ref 0.0–0.2)

## 2020-05-14 MED ORDER — SODIUM CHLORIDE 0.9 % IV SOLN
Freq: Once | INTRAVENOUS | Status: AC
  Filled 2020-05-14: qty 250

## 2020-05-14 MED ORDER — SODIUM CHLORIDE 0.9% FLUSH
10.0000 mL | INTRAVENOUS | Status: DC | PRN
  Administered 2020-05-14: 10 mL via INTRAVENOUS
  Filled 2020-05-14: qty 10

## 2020-05-14 MED ORDER — HEPARIN SOD (PORK) LOCK FLUSH 100 UNIT/ML IV SOLN
500.0000 [IU] | Freq: Once | INTRAVENOUS | Status: AC
  Filled 2020-05-14: qty 5

## 2020-05-14 MED ORDER — HEPARIN SOD (PORK) LOCK FLUSH 100 UNIT/ML IV SOLN
500.0000 [IU] | Freq: Once | INTRAVENOUS | Status: AC | PRN
  Administered 2020-05-14: 500 [IU]
  Filled 2020-05-14: qty 5

## 2020-05-14 MED ORDER — ALTEPLASE 2 MG IJ SOLR
2.0000 mg | Freq: Once | INTRAMUSCULAR | Status: DC | PRN
  Filled 2020-05-14: qty 2

## 2020-05-14 MED ORDER — HEPARIN SOD (PORK) LOCK FLUSH 100 UNIT/ML IV SOLN
INTRAVENOUS | Status: AC
  Filled 2020-05-14: qty 5

## 2020-05-14 MED ORDER — SODIUM CHLORIDE 0.9 % IV SOLN
200.0000 mg | Freq: Once | INTRAVENOUS | Status: AC
  Administered 2020-05-14: 200 mg via INTRAVENOUS
  Filled 2020-05-14: qty 8

## 2020-05-14 NOTE — Progress Notes (Signed)
DISCONTINUE ON PATHWAY REGIMEN - Head and Neck     Administer week 1:     Cetuximab    Administer weekly after initial loading dose:     Cetuximab   **Always confirm dose/schedule in your pharmacy ordering system**  REASON: Change In Patient Status PRIOR TREATMENT: [id number]: Cetuximab Loading Dose 400 mg/m2 Week 1 then 250 mg/m2 Weekly  START ON PATHWAY REGIMEN - Head and Neck     A cycle is 21 days:     Pembrolizumab   **Always confirm dose/schedule in your pharmacy ordering system**  Patient Characteristics: Oropharynx, HPV Positive, Metastatic, First Line, No Prior Platinum-Based Chemoradiation within 6 Months, PD-L1 Expression Positive (CPS ? 1) Disease Classification: Oropharynx HPV Status: Positive (+) Therapeutic Status: Metastatic Disease Line of Therapy: First Line Prior Platinum Status: No Prior Platinum-Based Chemoradiation within 6 Months PD-L1 Expression Status: PD-L1 Positive (CPS ? 1) Intent of Therapy: Non-Curative / Palliative Intent, Discussed with Patient

## 2020-05-14 NOTE — Progress Notes (Signed)
Ordered Hematology/Oncology  Follow Up note [place] Telephone:(336) [phone] Fax:([phone]   Patient Care Team: Albina Billet, MD as PCP - General (Internal Medicine) Albina Billet, MD (Internal Medicine) Bary Castilla, Forest Gleason, MD (General Surgery) Noreene Filbert, MD as Referring Physician (Radiation Oncology) Earlie Server, MD as Consulting Physician (Oncology)  REFERRING PROVIDER: Dr.McQueen CHIEF COMPLAINTS/PURPOSE OF CONSULTATION:  Follow up for head and neck cancer.  HISTORY OF PRESENTING ILLNESS:  Ivan Stjames. is a  64 y.o.  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 P16 positive. cT2 cN2 disease, stage II, Concurrent ChemoRT  Cisplatin 100 mg/m2 q3weeks with concurrent RT which started on 12/31/2017.  S/p one dose of Cisplatin. Cisplatin discontinued due to nephrotoxicity. Switch to weekly Carboplatin (AUC 2) / Taxol '45mg'$ /m2 [ finished May 2019] Patient has finished concurrent chemoradiation in May 2019  # He reports doing well until in July 2020, he started to have hoarseness after singing for 2 to 3 hours. He has changed his insurance and Dr. Tami Ribas is no longer covered by his current insurance.  So he establish care with Dr.Saliyah Gillin Juliann Pulse at [place] and was seen on 08/04/2019. Flexible laryngoscopy showed Vocal cord paralysis.  Patient was recommended to have a PET scan done for restaging. Patient's primary care provider Dr. Hall Busing ordered a PET scan for patient which was done on 08/20/2019.  PET scan unfortunately showed metastatic disease.  Patient called cancer center and is scheduled follow-up with me. 08/20/2019 PET scan showed asymmetric hypermetabolic him involving the left vocal cords with hypermetabolic adenopathy in the neck, and the chest.  He also has hypermetabolic pulmonary nodule along the minor fissure which  is felt to be metastatic. Tiny proximal left ureteral stone without hydronephrosis.  Possible gallbladder sludge.  Enlarged prostate.  Atherosclerosis.  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  # NGS showed PD-L1 CPS 20, TMB indeterminant, MSI stable,PIK3CA mutation # PET 04/26/2020 worsening of disease in the chest.  Interval enlargement of the right thoracic inlet and paratracheal lymph nodes that were previously enlarged with new activity in the right hilum corresponding to a lymph node in this location.  Also increased activity in the right upper lobe nodule. Right level 3 lymph node in the neck and anterior mediastinal nodal activity is diminished.  These areas are quite small on prior study. Diminished symmetric uptake within the left as compared to the right vocal cord. Right mandible increased activity in the area of third molar.  This is consistent with his recent tooth infection.  INTERVAL HISTORY Ivan Boutwell Abdulaziz Toman. is a 64 y.o. male who presents for follow-up for metastatic squamous cell carcinoma, P 16+.   He is s/p 6 cycles of carboplatin, 5-FU and Keytruda.  And has been on Keytruda maintenance. PET scan showed progression and I sent patient to [facility] for second opinion.  He was seen by Dr. Barrington Ellison on 05/05/2020.  Patient was offered for clinical trial with pembrolizumab/lenvatinib versus standard therapy.  Dr.Choe is waiting for PET scan images to be transferred to [facility] for further evaluation of his eligibility. Patient is here today to do additional 1 cycle of Keytruda prior to decision of being enrolled to clinical trial. He reports some mid chest burning sensation.  Otherwise no new complaints. No swallowing difficulty, breathing difficulties. Review of Systems  Constitutional: Negative for chills, fever, malaise/fatigue and weight loss.  HENT: Negative for sore throat.   Eyes: Negative for  redness.  Respiratory: Negative for cough, shortness of breath and wheezing.   Cardiovascular: Negative for chest pain, palpitations and leg swelling.  Gastrointestinal: Negative for abdominal pain, blood in stool, nausea and vomiting.  Genitourinary: Negative for dysuria.  Musculoskeletal: Negative for myalgias.  Skin: Negative for rash.  Neurological: Negative for dizziness, tingling and tremors.  Endo/Heme/Allergies: Does not bruise/bleed easily.  Psychiatric/Behavioral: Negative for hallucinations.    MEDICAL HISTORY:  Past Medical History:  Diagnosis Date  . Arthritis   . Cancer ([place])    Head and neck cancer  . Hemorrhoids   . Hyperlipidemia   . Hypertension     SURGICAL HISTORY: Past Surgical History:  Procedure Laterality Date  . COLONOSCOPY WITH PROPOFOL N/A 04/04/2017   Procedure: COLONOSCOPY WITH PROPOFOL;  Surgeon: Robert Bellow, MD;  [facility] ENDOSCOPY;  Service: Endoscopy;  Laterality: N/A;  . MANDIBLE SURGERY    . PORT-A-CATH REMOVAL  07/17/2018  . PORTA CATH INSERTION N/A 12/19/2017   Procedure: PORTA CATH INSERTION;  Surgeon: Algernon Huxley, MD;  Location: [place] CV LAB;  Service: Cardiovascular;  Laterality: N/A;  . PORTA CATH INSERTION N/A 07/17/2018   Procedure: PORTA CATH INSERTION;  Surgeon: Algernon Huxley, MD;  Location: [place] [facility] LAB;  Service: Cardiovascular;  Laterality: N/A;  . PORTA CATH INSERTION N/A 09/18/2019   Procedure: PORTA CATH INSERTION;  Surgeon: Algernon Huxley, MD;  Location: [place] CV LAB;  Service: Cardiovascular;  Laterality: N/A;  . TONSILLECTOMY      SOCIAL HISTORY: Social History   Socioeconomic History  . Marital status: Single    Spouse name: Not on file  . Number of children: Not on file  . Years of education: Not on file  . Highest education level: Not on file  Occupational History  . Not on file  Tobacco Use  . Smoking status: Never Smoker  . Smokeless tobacco: Never Used  Vaping Use  . Vaping  Use: Never used  Substance and Sexual Activity  . Alcohol use: No  . Drug use: No  . Sexual activity: Not on file  Other Topics Concern  . Not on file  Social History Narrative  . Not on file   Social Determinants of Health   Financial Resource Strain:   . Difficulty of Paying Living Expenses:   Food Insecurity:   . Worried About Charity fundraiser in the Last Year:   . Arboriculturist in the Last Year:   Transportation Needs:   . Film/video editor (Medical):   Marland Kitchen Lack of Transportation (Non-Medical):   Physical Activity:   . Days of Exercise per Week:   . Minutes of Exercise per Session:   Stress:   . Feeling of Stress :   Social Connections:   . Frequency of Communication with Friends and Family:   . Frequency of Social Gatherings with Friends and Family:   . Attends Religious Services:   . Active Member of Clubs or Organizations:   . Attends Archivist Meetings:   Marland Kitchen Marital Status:   Intimate Partner Violence:   . Fear of Current or Ex-Partner:   . Emotionally Abused:   Marland Kitchen Physically Abused:   . Sexually Abused:     FAMILY HISTORY: Family History  Problem Relation Age of Onset  . Breast cancer Mother   . Asthma Mother   .  Congestive Heart Failure Mother   . Prostate cancer Father   . Brain cancer Father   . Bladder Cancer Father     ALLERGIES:  has No Known Allergies.  MEDICATIONS:  Current Outpatient Medications  Medication Sig Dispense Refill  . amLODipine (NORVASC) 5 MG tablet Take 5 mg by mouth daily.    Marland Kitchen amoxicillin (AMOXIL) 500 MG capsule TAKE 2 CAPSULES BY MOUTH NOW, THEN 1 CAPSULE FOUR TIMES DAILY THEREAFTER    . lidocaine (XYLOCAINE) 2 % solution     . lidocaine-prilocaine (EMLA) cream Apply to affected area once 30 g 3  . Multiple Vitamins-Minerals (MULTIVITAMIN WITH MINERALS) tablet Take 1 tablet by mouth daily.    Marland Kitchen nystatin (MYCOSTATIN) 100000 UNIT/ML suspension Take 5 mLs (500,000 Units total) by mouth 4 (four) times daily.  (Patient not taking: Reported on 02/20/2020) 473 mL 0  . omeprazole (PRILOSEC) 20 MG capsule TAKE 1 CAPSULE BY MOUTH EVERY DAY 90 capsule 1  . ondansetron (ZOFRAN) 8 MG tablet Take 1 tablet (8 mg total) by mouth 2 (two) times daily as needed for refractory nausea / vomiting. Start on day 3 after carboplatin chemo. 30 tablet 1  . oxyCODONE (ROXICODONE) 5 MG immediate release tablet Take 1 tablet (5 mg total) by mouth every 12 (twelve) hours as needed for moderate pain, severe pain or breakthrough pain. (Patient not taking: Reported on 04/05/2020) 30 tablet 0  . prochlorperazine (COMPAZINE) 10 MG tablet Take 1 tablet (10 mg total) by mouth every 6 (six) hours as needed (Nausea or vomiting). 30 tablet 1  . rosuvastatin (CRESTOR) 10 MG tablet Take 10 mg by mouth every evening.  4   No current facility-administered medications for this visit.   Facility-Administered Medications Ordered in Other Visits  Medication Dose Route Frequency Provider Last Rate Last Admin  . heparin lock flush 100 unit/mL  500 Units Intravenous Once Earlie Server, MD      . sodium chloride flush (NS) 0.9 % injection 10 mL  10 mL Intravenous PRN Earlie Server, MD      . sodium chloride flush (NS) 0.9 % injection 10 mL  10 mL Intravenous PRN Earlie Server, MD         PHYSICAL EXAMINATION: ECOG PERFORMANCE STATUS: 1 - Symptomatic but completely ambulatory Vitals:   05/14/20 0837  BP: 127/84  Pulse: 85  Resp: 18  Temp: (!) 96.1 F (35.6 C)  SpO2: 98%   Physical Exam Constitutional:      General: He is not in acute distress.    Appearance: He is not diaphoretic.  HENT:     Head: Normocephalic and atraumatic.     Nose: Nose normal.     Mouth/Throat:     Pharynx: No oropharyngeal exudate.  Eyes:     General: No scleral icterus.    Pupils: Pupils are equal, round, and reactive to light.  Cardiovascular:     Rate and Rhythm: Normal rate and regular rhythm.     Heart sounds: No murmur heard.   Pulmonary:     Effort: Pulmonary  effort is normal. No respiratory distress.     Breath sounds: No rales.  Chest:     Chest wall: No tenderness.  Abdominal:     General: There is no distension.     Palpations: Abdomen is soft.     Tenderness: There is no abdominal tenderness.  Musculoskeletal:        General: Normal range of motion.     Cervical back: Normal range  of motion and neck supple.  Skin:    General: Skin is warm and dry.     Findings: No erythema.  Neurological:     Mental Status: He is alert and oriented to person, place, and time.     Cranial Nerves: No cranial nerve deficit.     Motor: No abnormal muscle tone.     Coordination: Coordination normal.  Psychiatric:        Mood and Affect: Affect normal.      LABORATORY DATA:  I have reviewed the data as listed Lab Results  Component Value Date   WBC 5.8 04/06/2020   HGB 13.5 04/06/2020   HCT 39.8 04/06/2020   MCV 93.6 04/06/2020   PLT 285 04/06/2020   Recent Labs    02/23/20 0831 03/15/20 0845 04/06/20 0749  NA 137 137 137  K 4.0 4.1 4.2  CL 105 106 103  CO2 _0 GLUCOSE 109* 93 117*  BUN 35* 23 22  CREATININE 1.34* 1.21 1.29*  CALCIUM 9.1 9.1 9.2  GFRNONAA 56* >60 58*  GFRAA >60 >60 >60  PROT 6.8 6.9 7.0  ALBUMIN 3.9 3.7 3.6  AST _1 ALT _2 ALKPHOS 84 76 62  BILITOT 0.8 1.0 0.8  RADIOGRAPHIC STUDIES: I have personally reviewed the radiological images as listed and agreed with the findings in the report. NM PET Image Restag (PS) Skull Base To Thigh  Addendum Date: 04/26/2020   ADDENDUM REPORT: 04/26/2020 17:41 ADDENDUM: Increased activity about the RIGHT mandible in the area of the third molar. Correlate with extraction or any signs of increasing pain that would indicate odontogenic infection or worsening dental caries. Area with limited assessment due to streak artifact. Some of this could also be related to artifact. These results will be called to the ordering clinician or representative by the Radiologist  Assistant, and communication documented in the PACS or [employer]. Electronically Signed   By: Zetta Bills M.D.   On: 04/26/2020 17:41   Result Date: 04/26/2020 CLINICAL DATA:  Subsequent treatment strategy for head neck cancer. EXAM: NUCLEAR MEDICINE PET SKULL BASE TO THIGH TECHNIQUE: 10.68 mCi F-18 FDG was injected intravenously. Full-ring PET imaging was performed from the skull base to thigh after the radiotracer. CT data was obtained and used for attenuation correction and anatomic localization. Fasting blood glucose: 86 mg/dl COMPARISON:  Multiple prior studies most recent from January 22, 2020 FINDINGS: Mediastinal blood pool activity: SUV max 2.1 Liver activity: SUV max NA NECK: No hypermetabolic activity aside from a high RIGHT paratracheal lymph node just at or below the thoracic inlet, shown to be enlarge in September of 2020 but with resolution on more recent exams. See below. The RIGHT level III lymph node seen on the prior study is unchanged posterior to the sternocleidomastoid musculature not well-defined and with some artifact in the area that makes assessment of metabolic activity difficult. Diminished activity associated with the LEFT vocal cord. Incidental CT findings: None CHEST: High RIGHT paratracheal lymph node is enlarged 2.0 x 1.8 cm (SUVmax = 12.9) previously 2.8 and with only minimal enlargement (image 71, series 3) Two adjacent small RIGHT paratracheal lymph nodes approximately 9 mm short axis on images 84-88. (SUVmax = 6.2) previously no hypermetabolic activity in this location or enlarged nodal tissue. RIGHT upper lobe spiculated nodule along the fissure measuring approximately 12 mm within 1 mm of prior measurement (image 109, series 3) (SUVmax = 4.2) previously 2.5 RIGHT hilar activity (  image 97, series 3) likely a juxta hilar lymph node actually seen as a discrete finding potentially on image 101 measuring 9 mm (SUVmax = 8.3) no activity seen in this location on the most  recent prior Anterior mediastinal activity is resolved. Soft tissue is not well-defined, less defined in the on the previous study. Incidental CT findings: Calcified coronary artery disease and calcified atherosclerotic changes similar to the prior study. Heart size is stable without pericardial fluid. ABDOMEN/PELVIS: Heterogeneous hepatic uptake. No visible lesion on CT. Hepatic cyst in the LEFT hepatic lobe similar to the prior study. No suspicious focal area of increased uptake in the abdomen or in the pelvis. Incidental CT findings: Calcified atherosclerosis of the abdominal aorta. No aneurysmal dilation. No acute gastrointestinal process with colonic diverticulosis. Stable presumed hepatic cyst. SKELETON: Increased mandibular activity on the RIGHT with potential interval extraction of RIGHT mandibular third molar. Diminished diffuse marrow activity that was seen on the previous study. No focal hypermetabolic area to suggest skeletal metastasis. Incidental CT findings: none IMPRESSION: 1. Worsening of disease in the chest as described above. Interval enlargement of RIGHT thoracic inlet and paratracheal lymph nodes that were previously enlarged with new activity in the RIGHT hilum corresponding to a lymph node in this location. Also with increased activity in the RIGHT upper lobe nodule. 2. RIGHT level III lymph node in the neck and anterior mediastinal nodal activity is diminished, these areas were quite small on the prior study. 3. Diminished asymmetry of uptake within the LEFT as compared to the RIGHT vocal cord, attention on follow-up. Electronically Signed: By: Zetta Bills M.D. On: 04/26/2020 11:37     ASSESSMENT & PLAN:   1. 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[place])   2. Encounter for antineoplastic immunotherapy   3. Goals of care, counseling/discussion   4. Port-A-Cath in place   5. Gastroesophageal reflux disease, unspecified whether esophagitis present    #Metastatic squamous cell  carcinoma of oropharynx- cervical lymphadenopathy, lung metastatic disease.PD-L1 CPS 20 S/p 6 cycles Keytruda/carboplatin/5-FU day 1-4 treatments-finished in February 2021 Patient has been on immunotherapy Keytruda for maintenance.  Goal of care is palliative intent. PET scan showed progression.  Patient has followed up with [facility] for discussion of possible clinical trial with pembrolizumab and lenvatinib.  Pending his decision for enrollment. I discussed with Dr. Barrington Ellison and we agree on proceeding with 1 additional dose of Keytruda while patient is waiting for decision of clinical trial participation.  Today patient expresses his thoughts about maybe not going to [facility] for clinical trial. I discussed him with the potential benefit of clinical trial and encouraged him to consider. I will respect his decision if he decides not to proceed with trial at [facility] and I will probably proceed with chemotherapy. His alpha gal came back elevated indicating a potential high risk of anaphylactic reaction to cetuximab.  We will hold off on that.  GERD, he is made chest burning sensation may be secondary to GERD and he admits not taking PPI currently.  Encourage patient to resume PPI.  He can always also utilize Carafate if symptoms are not better.  Port-A-Cath in place, we discussed about port flush every 6 to 8 weeks if he is not on active treatment. The patient knows to call the clinic with any problems questions or concerns. Follow up  To be determined, pending on decision of clinical trial.   Earlie Server, MD, PhD Hematology Oncology [place] at [facility] 05/14/2020

## 2020-05-14 NOTE — Progress Notes (Signed)
Patient reports mild esophageal pain especially with swallowing.

## 2020-06-28 ENCOUNTER — Other Ambulatory Visit: Payer: Self-pay | Admitting: Oncology

## 2020-06-28 NOTE — Progress Notes (Signed)
DISCONTINUE ON PATHWAY REGIMEN - Head and Neck     A cycle is 21 days:     Pembrolizumab   **Always confirm dose/schedule in your pharmacy ordering system**  REASON: Disease Progression PRIOR TREATMENT: [id number]: Pembrolizumab 200 mg q21 Days for up to 24 Months TREATMENT RESPONSE: Progressive Disease (PD)  START OFF PATHWAY REGIMEN - Head and Neck   [id number]:Docetaxel 75 mg/m2:   A cycle is every 21 days:     Docetaxel   **Always confirm dose/schedule in your pharmacy ordering system**  Patient Characteristics: Oropharynx, HPV Positive, Metastatic, Third Line Disease Classification: Oropharynx HPV Status: Positive (+) Therapeutic Status: Metastatic Disease Line of Therapy: Third Line  Intent of Therapy: Non-Curative / Palliative Intent, Discussed with Patient

## 2020-06-29 ENCOUNTER — Telehealth: Payer: Self-pay

## 2020-06-29 ENCOUNTER — Other Ambulatory Visit: Payer: Self-pay | Admitting: Oncology

## 2020-06-29 DIAGNOSIS — C109 Malignant neoplasm of oropharynx, unspecified: Secondary | ICD-10-CM

## 2020-06-29 NOTE — Telephone Encounter (Signed)
Done.. Pt has been scheduled as requested. Called pt to make him aware of his scheduled 07/06/20 appts for lab/MD/ *NEW* Docetaxel  and 07/08/20 Fulphila Inj day 3.

## 2020-06-29 NOTE — Telephone Encounter (Signed)
Please schedule patient for lab/Md/ Docetaxel *new* one day next week & fulphila day 3. please notify pt of appts.

## 2020-07-06 ENCOUNTER — Inpatient Hospital Stay (HOSPITAL_BASED_OUTPATIENT_CLINIC_OR_DEPARTMENT_OTHER): Payer: Medicare Other | Admitting: Oncology

## 2020-07-06 ENCOUNTER — Encounter: Payer: Self-pay | Admitting: Oncology

## 2020-07-06 ENCOUNTER — Inpatient Hospital Stay: Payer: Medicare Other | Attending: Oncology

## 2020-07-06 ENCOUNTER — Other Ambulatory Visit: Payer: Self-pay

## 2020-07-06 ENCOUNTER — Inpatient Hospital Stay: Payer: Medicare Other

## 2020-07-06 VITALS — BP 122/74 | HR 76

## 2020-07-06 VITALS — BP 137/91 | HR 90 | Temp 96.1°F | Resp 18 | Wt 198.1 lb

## 2020-07-06 DIAGNOSIS — C109 Malignant neoplasm of oropharynx, unspecified: Secondary | ICD-10-CM | POA: Diagnosis present

## 2020-07-06 DIAGNOSIS — Z803 Family history of malignant neoplasm of breast: Secondary | ICD-10-CM | POA: Insufficient documentation

## 2020-07-06 DIAGNOSIS — Z8042 Family history of malignant neoplasm of prostate: Secondary | ICD-10-CM | POA: Diagnosis not present

## 2020-07-06 DIAGNOSIS — Z5112 Encounter for antineoplastic immunotherapy: Secondary | ICD-10-CM | POA: Diagnosis not present

## 2020-07-06 DIAGNOSIS — N1831 Chronic kidney disease, stage 3a: Secondary | ICD-10-CM

## 2020-07-06 DIAGNOSIS — Z808 Family history of malignant neoplasm of other organs or systems: Secondary | ICD-10-CM | POA: Insufficient documentation

## 2020-07-06 DIAGNOSIS — Z5189 Encounter for other specified aftercare: Secondary | ICD-10-CM | POA: Insufficient documentation

## 2020-07-06 DIAGNOSIS — C7800 Secondary malignant neoplasm of unspecified lung: Secondary | ICD-10-CM | POA: Diagnosis not present

## 2020-07-06 DIAGNOSIS — Z8052 Family history of malignant neoplasm of bladder: Secondary | ICD-10-CM | POA: Diagnosis not present

## 2020-07-06 DIAGNOSIS — Z5111 Encounter for antineoplastic chemotherapy: Secondary | ICD-10-CM | POA: Insufficient documentation

## 2020-07-06 LAB — CBC WITH DIFFERENTIAL/PLATELET
Abs Immature Granulocytes: 0.02 10*3/uL (ref 0.00–0.07)
Basophils Absolute: 0.1 10*3/uL (ref 0.0–0.1)
Basophils Relative: 1 %
Eosinophils Absolute: 0.2 10*3/uL (ref 0.0–0.5)
Eosinophils Relative: 2 %
HCT: 41.5 % (ref 39.0–52.0)
Hemoglobin: 14.2 g/dL (ref 13.0–17.0)
Immature Granulocytes: 0 %
Lymphocytes Relative: 9 %
Lymphs Abs: 0.7 10*3/uL (ref 0.7–4.0)
MCH: 29.8 pg (ref 26.0–34.0)
MCHC: 34.2 g/dL (ref 30.0–36.0)
MCV: 87.2 fL (ref 80.0–100.0)
Monocytes Absolute: 0.7 10*3/uL (ref 0.1–1.0)
Monocytes Relative: 9 %
Neutro Abs: 6.3 10*3/uL (ref 1.7–7.7)
Neutrophils Relative %: 79 %
Platelets: 230 10*3/uL (ref 150–400)
RBC: 4.76 MIL/uL (ref 4.22–5.81)
RDW: 13.1 % (ref 11.5–15.5)
WBC: 7.9 10*3/uL (ref 4.0–10.5)
nRBC: 0 % (ref 0.0–0.2)

## 2020-07-06 LAB — COMPREHENSIVE METABOLIC PANEL
ALT: 16 U/L (ref 0–44)
AST: 16 U/L (ref 15–41)
Albumin: 3.8 g/dL (ref 3.5–5.0)
Alkaline Phosphatase: 83 U/L (ref 38–126)
Anion gap: 8 (ref 5–15)
BUN: 26 mg/dL — ABNORMAL HIGH (ref 8–23)
CO2: 26 mmol/L (ref 22–32)
Calcium: 9.2 mg/dL (ref 8.9–10.3)
Chloride: 104 mmol/L (ref 98–111)
Creatinine, Ser: 1.4 mg/dL — ABNORMAL HIGH (ref 0.61–1.24)
GFR calc Af Amer: >60 mL/min (ref >60)
GFR calc non Af Amer: 52 mL/min — ABNORMAL LOW (ref >60)
Glucose, Bld: 97 mg/dL (ref 70–99)
Potassium: 4.1 mmol/L (ref 3.5–5.1)
Sodium: 138 mmol/L (ref 135–145)
Total Bilirubin: 0.9 mg/dL (ref 0.3–1.2)
Total Protein: 7.2 g/dL (ref 6.5–8.1)

## 2020-07-06 MED ORDER — SODIUM CHLORIDE 0.9 % IV SOLN
75.0000 mg/m2 | Freq: Once | INTRAVENOUS | Status: AC
  Administered 2020-07-06: 150 mg via INTRAVENOUS
  Filled 2020-07-06: qty 15

## 2020-07-06 MED ORDER — SODIUM CHLORIDE 0.9 % IV SOLN
Freq: Once | INTRAVENOUS | Status: AC
  Filled 2020-07-06: qty 250

## 2020-07-06 MED ORDER — SODIUM CHLORIDE 0.9% FLUSH
10.0000 mL | INTRAVENOUS | Status: DC | PRN
  Administered 2020-07-06: 10 mL via INTRAVENOUS
  Filled 2020-07-06: qty 10

## 2020-07-06 MED ORDER — HEPARIN SOD (PORK) LOCK FLUSH 100 UNIT/ML IV SOLN
500.0000 [IU] | Freq: Once | INTRAVENOUS | Status: DC
  Filled 2020-07-06: qty 5

## 2020-07-06 MED ORDER — HEPARIN SOD (PORK) LOCK FLUSH 100 UNIT/ML IV SOLN
INTRAVENOUS | Status: AC
  Filled 2020-07-06: qty 5

## 2020-07-06 MED ORDER — SODIUM CHLORIDE 0.9 % IV SOLN
10.0000 mg | Freq: Once | INTRAVENOUS | Status: AC
  Administered 2020-07-06: 10 mg via INTRAVENOUS
  Filled 2020-07-06: qty 10

## 2020-07-06 MED ORDER — HEPARIN SOD (PORK) LOCK FLUSH 100 UNIT/ML IV SOLN
500.0000 [IU] | Freq: Once | INTRAVENOUS | Status: AC | PRN
  Administered 2020-07-06: 500 [IU]
  Filled 2020-07-06: qty 5

## 2020-07-06 NOTE — Progress Notes (Signed)
Hematology/Oncology  Follow Up note [facility] [phone] Fax:([phone]   Patient Care Team: Albina Billet, MD as PCP - General (Internal Medicine) Albina Billet, MD (Internal Medicine) Bary Castilla, Forest Gleason, MD (General Surgery) Noreene Filbert, MD as Referring Physician (Radiation Oncology) Earlie Server, MD as Consulting Physician (Oncology)  REFERRING PROVIDER: Dr.McQueen CHIEF COMPLAINTS/PURPOSE OF CONSULTATION:  Follow up for head and neck cancer.  HISTORY OF PRESENTING ILLNESS:  Ivan Garcia. is a  64 y.o.  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 P16 positive. cT2 cN2 disease, stage II, # Concurrent ChemoRT  Cisplatin 100 mg/m2 q3weeks with concurrent RT which started on 12/31/2017.  S/p one dose of Cisplatin. Cisplatin discontinued due to nephrotoxicity. Switch to weekly Carboplatin (AUC 2) / Taxol 12m/m2 [ finished May 2019] Patient has finished concurrent chemoradiation in May 2019  # He reports doing well until in July 2020, he started to have hoarseness after singing for 2 to 3 hours. He has changed his insurance and Dr. MTami Ribasis no longer covered by his current insurance.  So he [facility]. Flexible laryngoscopy showed Vocal cord paralysis.  Patient was recommended to have a PET scan done for restaging. Patient's primary care provider Dr. THall Busingordered a PET scan for patient which was done on 08/20/2019. 08/20/2019 PET scan showed asymmetric hypermetabolic him involving the left vocal cords with hypermetabolic adenopathy in the neck, and the chest.  He also has hypermetabolic pulmonary nodule along the minor fissure which is felt to be metastatic. Tiny proximal left ureteral stone without hydronephrosis.  Possible gallbladder sludge.  Enlarged  prostate.  Atherosclerosis.  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  # NGS showed PD-L1 CPS 20, TMB indeterminant, MSI stable,PIK3CA mutation Started on chemotherapy with 6 cycles of carboplatin, 5-FU and Keytruda, finished in February 2021.  And has been on Keytruda maintenance. Initially he had a good response, however progressed whiled on Keytruda maintenance.  PET 04/26/2020 worsening of disease in the chest.  Interval enlargement of the right thoracic inlet and paratracheal lymph nodes that were previously enlarged with new activity in the right hilum corresponding to a lymph node in this location.  Also increased activity in the right upper lobe nodule. Right level 3 lymph node in the neck and anterior mediastinal nodal activity is diminished.  These areas are quite small on prior study. Diminished symmetric uptake within the left as compared to the right vocal cord. Right mandible increased activity in the area of third molar.  This is consistent with his recent tooth infection.  [facility].  Patient was offered for clinical trial with pembrolizumab/lenvatinib versus standard therapy. Patient received additional 1 cycle of Keytruda while waiting to be enrolled to the trial.   INTERVAL HISTORY [name] is a 64y.o. male who presents for follow-up for metastatic squamous cell carcinoma, P 16+.   Unfortunately, his disease progressed and he is not eligible 06/24/2020, [facility] right upper paratracheal node is concerning for metastatic disease and a new from 01/20/2020 PET/CT.  The node closely abuts and possibly invade the posterolateral right tracheal wall as well as the esophagus.  Right vocal cord paralysis.  Symmetric soft tissue density at the right base of tongue could reflect  residual/recurrent primary malignancy versus posttreatment changes.  Right supraclavicular lymph node is no longer appreciated with post treatment changes.  There is increased size of right minor fissure nodule which was metabolically avid on previous PET scan.  New mediastinal and hilar lymphadenopathy concerning for new nodal disease.  No evidence of metastatic disease below the diaphragm.  Nonobstructing 3 mm distal ureter stone.    Review of Systems  Constitutional: Negative for chills, fever, malaise/fatigue and weight loss.  HENT: Negative for sore throat.   Eyes: Negative for redness.  Respiratory: Negative for cough, shortness of breath and wheezing.   Cardiovascular: Negative for chest pain, palpitations and leg swelling.  Gastrointestinal: Negative for abdominal pain, blood in stool, nausea and vomiting.       Some swallowing difficulty with solid food.  No difficulty with liquid or soft food.  Genitourinary: Negative for dysuria.  Musculoskeletal: Negative for myalgias.  Skin: Negative for rash.  Neurological: Negative for dizziness, tingling and tremors.  Endo/Heme/Allergies: Does not bruise/bleed easily.  Psychiatric/Behavioral: Negative for hallucinations.    MEDICAL HISTORY:  Past Medical History:  Diagnosis Date  . Arthritis   . Cancer ([place])    Head and neck cancer  . Hemorrhoids   . Hyperlipidemia   . Hypertension     SURGICAL HISTORY: Past Surgical History:  Procedure Laterality Date  . COLONOSCOPY WITH PROPOFOL N/A 04/04/2017   Procedure: COLONOSCOPY WITH PROPOFOL;  Surgeon: Robert Bellow, MD;  Location: [facility] ENDOSCOPY;  Service: Endoscopy;  Laterality: N/A;  . MANDIBLE SURGERY    . PORT-A-CATH REMOVAL  07/17/2018  . PORTA CATH INSERTION N/A 12/19/2017   Procedure: PORTA CATH INSERTION;  Surgeon: Algernon Huxley, MD;  Location: [place] [facility] LAB;  Service: Cardiovascular;  Laterality: N/A;  . PORTA CATH INSERTION N/A 07/17/2018   Procedure: PORTA CATH  INSERTION;  Surgeon: Algernon Huxley, MD;  Location: [place] [facility];  Service: Cardiovascular;  Laterality: N/A;  . PORTA CATH INSERTION N/A 09/18/2019   Procedure: PORTA CATH INSERTION;  Surgeon: Algernon Huxley, MD;  Location: [place] [facility];  Service: Cardiovascular;  Laterality: N/A;  . TONSILLECTOMY      SOCIAL HISTORY: Social History   Socioeconomic History  . Marital status: Single    Spouse name: Not on file  . Number of children: Not on file  . Years of education: Not on file  . Highest education level: Not on file  Occupational History  . Not on file  Tobacco Use  . Smoking status: Never Smoker  . Smokeless tobacco: Never Used  Vaping Use  . Vaping Use: Never used  Substance and Sexual Activity  . Alcohol use: No  . Drug use: No  . Sexual activity: Not on file  Other Topics Concern  . Not on file  Social History Narrative  . Not on file   Social Determinants of Health   Financial Resource Strain:   . Difficulty of Paying Living Expenses:   Food Insecurity:   . Worried About Charity fundraiser in the Last Year:   . Arboriculturist in the Last Year:   Transportation Needs:   . Film/video editor (Medical):   Marland Kitchen Lack of Transportation (Non-Medical):   Physical Activity:   . Days of Exercise per Week:   . Minutes of Exercise per Session:   Stress:   . Feeling  of Stress :   Social Connections:   . Frequency of Communication with Friends and Family:   . Frequency of Social Gatherings with Friends and Family:   . Attends Religious Services:   . Active Member of Clubs or Organizations:   . Attends Archivist Meetings:   Marland Kitchen Marital Status:   Intimate Partner Violence:   . Fear of Current or Ex-Partner:   . Emotionally Abused:   Marland Kitchen Physically Abused:   . Sexually Abused:     FAMILY HISTORY: Family History  Problem Relation Age of Onset  . Breast cancer Mother   . Asthma Mother   . Congestive Heart Failure Mother   . Prostate cancer  Father   . Brain cancer Father   . Bladder Cancer Father     ALLERGIES:  has No Known Allergies.  MEDICATIONS:  Current Outpatient Medications  Medication Sig Dispense Refill  . amLODipine (NORVASC) 5 MG tablet Take 5 mg by mouth daily.    Marland Kitchen amoxicillin (AMOXIL) 500 MG capsule TAKE 2 CAPSULES BY MOUTH NOW, THEN 1 CAPSULE FOUR TIMES DAILY THEREAFTER (Patient not taking: Reported on 05/14/2020)    . lidocaine (XYLOCAINE) 2 % solution     . lidocaine-prilocaine (EMLA) cream Apply to affected area once 30 g 3  . Multiple Vitamins-Minerals (MULTIVITAMIN WITH MINERALS) tablet Take 1 tablet by mouth daily.    Marland Kitchen nystatin (MYCOSTATIN) 100000 UNIT/ML suspension Take 5 mLs (500,000 Units total) by mouth 4 (four) times daily. (Patient not taking: Reported on 02/20/2020) 473 mL 0  . omeprazole (PRILOSEC) 20 MG capsule TAKE 1 CAPSULE BY MOUTH EVERY DAY (Patient taking differently: prn) 90 capsule 1  . ondansetron (ZOFRAN) 8 MG tablet Take 1 tablet (8 mg total) by mouth 2 (two) times daily as needed for refractory nausea / vomiting. Start on day 3 after carboplatin chemo. (Patient not taking: Reported on 05/14/2020) 30 tablet 1  . oxyCODONE (ROXICODONE) 5 MG immediate release tablet Take 1 tablet (5 mg total) by mouth every 12 (twelve) hours as needed for moderate pain, severe pain or breakthrough pain. (Patient not taking: Reported on 04/05/2020) 30 tablet 0  . prochlorperazine (COMPAZINE) 10 MG tablet Take 1 tablet (10 mg total) by mouth every 6 (six) hours as needed (Nausea or vomiting). (Patient not taking: Reported on 05/14/2020) 30 tablet 1  . rosuvastatin (CRESTOR) 10 MG tablet Take 10 mg by mouth every evening.  4   No current facility-administered medications for this visit.   Facility-Administered Medications Ordered in Other Visits  Medication Dose Route Frequency Provider Last Rate Last Admin  . heparin lock flush 100 unit/mL  500 Units Intravenous Once Earlie Server, MD      . sodium chloride flush  (NS) 0.9 % injection 10 mL  10 mL Intravenous PRN Earlie Server, MD      . sodium chloride flush (NS) 0.9 % injection 10 mL  10 mL Intravenous PRN Earlie Server, MD         PHYSICAL EXAMINATION: ECOG PERFORMANCE STATUS: 1 - Symptomatic but completely ambulatory Vitals:   07/06/20 0844  BP: (!) 137/91  Pulse: 90  Resp: 18  Temp: (!) 96.1 F (35.6 C)   Physical Exam Constitutional:      General: He is not in acute distress.    Appearance: He is not diaphoretic.  HENT:     Head: Normocephalic and atraumatic.     Nose: Nose normal.     Mouth/Throat:     Pharynx: No  oropharyngeal exudate.  Eyes:     General: No scleral icterus.    Pupils: Pupils are equal, round, and reactive to light.  Cardiovascular:     Rate and Rhythm: Normal rate and regular rhythm.     Heart sounds: No murmur heard.   Pulmonary:     Effort: Pulmonary effort is normal. No respiratory distress.     Breath sounds: No rales.  Chest:     Chest wall: No tenderness.  Abdominal:     General: There is no distension.     Palpations: Abdomen is soft.     Tenderness: There is no abdominal tenderness.  Musculoskeletal:        General: Normal range of motion.     Cervical back: Normal range of motion and neck supple.  Skin:    General: Skin is warm and dry.     Findings: No erythema.  Neurological:     Mental Status: He is alert and oriented to person, place, and time.     Cranial Nerves: No cranial nerve deficit.     Motor: No abnormal muscle tone.     Coordination: Coordination normal.  Psychiatric:        Mood and Affect: Affect normal.      LABORATORY DATA:  I have reviewed the data as listed Lab Results  Component Value Date   WBC 6.4 05/14/2020   HGB 13.9 05/14/2020   HCT 41.2 05/14/2020   MCV 90.0 05/14/2020   PLT 219 05/14/2020   Recent Labs    03/15/20 0845 04/06/20 0749 05/14/20 0812  NA 137 137 140  K 4.1 4.2 4.2  CL 106 103 107  CO2 '25 25 25  ' GLUCOSE 93 117* 104*  BUN 23 22 30*    CREATININE 1.21 1.29* 1.31*  CALCIUM 9.1 9.2 9.0  GFRNONAA >60 58* 57*  GFRAA >60 >60 >60  PROT 6.9 7.0 7.0  ALBUMIN 3.7 3.6 3.9  AST '19 16 18  ' ALT '18 19 21  ' ALKPHOS 76 62 79  BILITOT 1.0 0.8 0.9  RADIOGRAPHIC STUDIES: I have personally reviewed the radiological images as listed and agreed with the findings in the report. NM PET Image Restag (PS) Skull Base To Thigh  Addendum Date: 04/26/2020   ADDENDUM REPORT: 04/26/2020 17:41 ADDENDUM: Increased activity about the RIGHT mandible in the area of the third molar. Correlate with extraction or any signs of increasing pain that would indicate odontogenic infection or worsening dental caries. Area with limited assessment due to streak artifact. Some of this could also be related to artifact. These results will be called to the ordering clinician or representative by the Radiologist Assistant, and communication documented in the PACS or [employer]. Electronically Signed   By: Zetta Bills M.D.   On: 04/26/2020 17:41   Result Date: 04/26/2020 CLINICAL DATA:  Subsequent treatment strategy for head neck cancer. EXAM: NUCLEAR MEDICINE PET SKULL BASE TO THIGH TECHNIQUE: 10.68 mCi F-18 FDG was injected intravenously. Full-ring PET imaging was performed from the skull base to thigh after the radiotracer. CT data was obtained and used for attenuation correction and anatomic localization. Fasting blood glucose: 86 mg/dl COMPARISON:  Multiple prior studies most recent from January 22, 2020 FINDINGS: Mediastinal blood pool activity: SUV max 2.1 Liver activity: SUV max NA NECK: No hypermetabolic activity aside from a high RIGHT paratracheal lymph node just at or below the thoracic inlet, shown to be enlarge in September of 2020 but with resolution on more recent exams. See  below. The RIGHT level III lymph node seen on the prior study is unchanged posterior to the sternocleidomastoid musculature not well-defined and with some artifact in the area that makes  assessment of metabolic activity difficult. Diminished activity associated with the LEFT vocal cord. Incidental CT findings: None CHEST: High RIGHT paratracheal lymph node is enlarged 2.0 x 1.8 cm (SUVmax = 12.9) previously 2.8 and with only minimal enlargement (image 71, series 3) Two adjacent small RIGHT paratracheal lymph nodes approximately 9 mm short axis on images 84-88. (SUVmax = 6.2) previously no hypermetabolic activity in this location or enlarged nodal tissue. RIGHT upper lobe spiculated nodule along the fissure measuring approximately 12 mm within 1 mm of prior measurement (image 109, series 3) (SUVmax = 4.2) previously 2.5 RIGHT hilar activity (image 97, series 3) likely a juxta hilar lymph node actually seen as a discrete finding potentially on image 101 measuring 9 mm (SUVmax = 8.3) no activity seen in this location on the most recent prior Anterior mediastinal activity is resolved. Soft tissue is not well-defined, less defined in the on the previous study. Incidental CT findings: Calcified coronary artery disease and calcified atherosclerotic changes similar to the prior study. Heart size is stable without pericardial fluid. ABDOMEN/PELVIS: Heterogeneous hepatic uptake. No visible lesion on CT. Hepatic cyst in the LEFT hepatic lobe similar to the prior study. No suspicious focal area of increased uptake in the abdomen or in the pelvis. Incidental CT findings: Calcified atherosclerosis of the abdominal aorta. No aneurysmal dilation. No acute gastrointestinal process with colonic diverticulosis. Stable presumed hepatic cyst. SKELETON: Increased mandibular activity on the RIGHT with potential interval extraction of RIGHT mandibular third molar. Diminished diffuse marrow activity that was seen on the previous study. No focal hypermetabolic area to suggest skeletal metastasis. Incidental CT findings: none IMPRESSION: 1. Worsening of disease in the chest as described above. Interval enlargement of RIGHT  thoracic inlet and paratracheal lymph nodes that were previously enlarged with new activity in the RIGHT hilum corresponding to a lymph node in this location. Also with increased activity in the RIGHT upper lobe nodule. 2. RIGHT level III lymph node in the neck and anterior mediastinal nodal activity is diminished, these areas were quite small on the prior study. 3. Diminished asymmetry of uptake within the LEFT as compared to the RIGHT vocal cord, attention on follow-up. Electronically Signed: By: Zetta Bills M.D. On: 04/26/2020 11:37     ASSESSMENT & PLAN:   1. 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[place])   2. Encounter for antineoplastic immunotherapy   3. 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 Barrington Ellison at [facility].  Patient is not eligible for the face to randomized open trial deep 009 clinical trial with lenvatinib added to pembrolizumab versus lenvatinib monotherapy versus standard of care. Dr. Barrington Ellison recommend patient to proceed with next line of standard care with docetaxel and after few cycles, Dr. Lajuan Lines will arrange patient to have CT scan done at [facility] for evaluation of treatment response. There may be other clinical trials available at [facility] but currently not open. I explained to the patient the risks and benefits of chemotherapy docetaxel including all but not limited to hair loss, mouth sore, nausea, vomiting, diarrhea, low blood counts, bleeding, neuropathy and risk of life threatening infection and even death, secondary malignancy etc.  . Patient voices understanding and willing to proceed chemotherapy.  Supportive care measures are  necessary for patient well-being and will be provided as necessary.   Labs are reviewed and discussed with patient.  Proceed with cycle 1 docetaxel.  Day 3 G-CSF support.  Advised patient to start taking Claritin daily for 4 days  per Chronic kidney disease, avoid nephrotoxin. The patient knows to call the clinic with any problems questions or concerns. Follow up 10 days for follow-up for evaluation of treatment tolerability.  Follow-up in 3 weeks for the next cycle of treatment  Earlie Server, MD, PhD Hematology Oncology [place] at [facility] 07/06/2020

## 2020-07-06 NOTE — Progress Notes (Signed)
Pt here for C1 of Taxotere. Consent obtained. Given per protocol as titration. VSS . Pt completed without incident

## 2020-07-06 NOTE — Progress Notes (Signed)
Pt here for follow up. Pt reports he is having trouble swallowing and he had a CT scan done at [facility] that showed a tumor between esophagus and trachea.

## 2020-07-08 ENCOUNTER — Other Ambulatory Visit: Payer: Self-pay

## 2020-07-08 ENCOUNTER — Inpatient Hospital Stay: Payer: Medicare Other

## 2020-07-08 DIAGNOSIS — Z5111 Encounter for antineoplastic chemotherapy: Secondary | ICD-10-CM | POA: Diagnosis not present

## 2020-07-08 DIAGNOSIS — C109 Malignant neoplasm of oropharynx, unspecified: Secondary | ICD-10-CM

## 2020-07-08 MED ORDER — PEGFILGRASTIM-JMDB 6 MG/0.6ML ~~LOC~~ SOSY
6.0000 mg | PREFILLED_SYRINGE | Freq: Once | SUBCUTANEOUS | Status: AC
  Administered 2020-07-08: 6 mg via SUBCUTANEOUS
  Filled 2020-07-08: qty 0.6

## 2020-07-13 ENCOUNTER — Encounter: Payer: Self-pay | Admitting: Oncology

## 2020-07-13 NOTE — Progress Notes (Signed)
Pt pre screened for follow up appt. Pt report that after treatment last weak he felt very weak and had "terrible" neuropathy and sciatic pain. Pt also reports having mild nausea that is relieved by ondansetron.

## 2020-07-14 ENCOUNTER — Inpatient Hospital Stay (HOSPITAL_BASED_OUTPATIENT_CLINIC_OR_DEPARTMENT_OTHER): Payer: Medicare Other | Admitting: Oncology

## 2020-07-14 ENCOUNTER — Other Ambulatory Visit: Payer: Self-pay

## 2020-07-14 ENCOUNTER — Inpatient Hospital Stay: Payer: Medicare Other

## 2020-07-14 VITALS — BP 123/84 | HR 99 | Temp 96.7°F | Resp 18 | Wt 199.9 lb

## 2020-07-14 DIAGNOSIS — Z5111 Encounter for antineoplastic chemotherapy: Secondary | ICD-10-CM | POA: Diagnosis not present

## 2020-07-14 DIAGNOSIS — M898X9 Other specified disorders of bone, unspecified site: Secondary | ICD-10-CM

## 2020-07-14 DIAGNOSIS — N1831 Chronic kidney disease, stage 3a: Secondary | ICD-10-CM

## 2020-07-14 DIAGNOSIS — C109 Malignant neoplasm of oropharynx, unspecified: Secondary | ICD-10-CM | POA: Diagnosis not present

## 2020-07-14 DIAGNOSIS — G620 Drug-induced polyneuropathy: Secondary | ICD-10-CM | POA: Diagnosis not present

## 2020-07-14 DIAGNOSIS — T451X5A Adverse effect of antineoplastic and immunosuppressive drugs, initial encounter: Secondary | ICD-10-CM

## 2020-07-14 LAB — CBC WITH DIFFERENTIAL/PLATELET
Abs Immature Granulocytes: 1.2 10*3/uL — ABNORMAL HIGH (ref 0.00–0.07)
Band Neutrophils: 11 %
Basophils Absolute: 0 10*3/uL (ref 0.0–0.1)
Basophils Relative: 0 %
Eosinophils Absolute: 0.2 10*3/uL (ref 0.0–0.5)
Eosinophils Relative: 1 %
HCT: 38.1 % — ABNORMAL LOW (ref 39.0–52.0)
Hemoglobin: 12.9 g/dL — ABNORMAL LOW (ref 13.0–17.0)
Lymphocytes Relative: 3 %
Lymphs Abs: 0.7 10*3/uL (ref 0.7–4.0)
MCH: 29.9 pg (ref 26.0–34.0)
MCHC: 33.9 g/dL (ref 30.0–36.0)
MCV: 88.2 fL (ref 80.0–100.0)
Metamyelocytes Relative: 2 %
Monocytes Absolute: 2.2 10*3/uL — ABNORMAL HIGH (ref 0.1–1.0)
Monocytes Relative: 9 %
Myelocytes: 3 %
Neutro Abs: 20.3 10*3/uL — ABNORMAL HIGH (ref 1.7–7.7)
Neutrophils Relative %: 71 %
Platelets: 213 10*3/uL (ref 150–400)
RBC: 4.32 MIL/uL (ref 4.22–5.81)
RDW: 13.6 % (ref 11.5–15.5)
Smear Review: ADEQUATE
WBC: 24.7 10*3/uL — ABNORMAL HIGH (ref 4.0–10.5)
nRBC: 0.1 % (ref 0.0–0.2)

## 2020-07-14 LAB — COMPREHENSIVE METABOLIC PANEL
ALT: 18 U/L (ref 0–44)
AST: 22 U/L (ref 15–41)
Albumin: 3.6 g/dL (ref 3.5–5.0)
Alkaline Phosphatase: 109 U/L (ref 38–126)
Anion gap: 9 (ref 5–15)
BUN: 21 mg/dL (ref 8–23)
CO2: 27 mmol/L (ref 22–32)
Calcium: 9.1 mg/dL (ref 8.9–10.3)
Chloride: 102 mmol/L (ref 98–111)
Creatinine, Ser: 1.5 mg/dL — ABNORMAL HIGH (ref 0.61–1.24)
GFR calc Af Amer: 56 mL/min — ABNORMAL LOW (ref >60)
GFR calc non Af Amer: 48 mL/min — ABNORMAL LOW (ref >60)
Glucose, Bld: 124 mg/dL — ABNORMAL HIGH (ref 70–99)
Potassium: 4 mmol/L (ref 3.5–5.1)
Sodium: 138 mmol/L (ref 135–145)
Total Bilirubin: 0.5 mg/dL (ref 0.3–1.2)
Total Protein: 7 g/dL (ref 6.5–8.1)

## 2020-07-14 MED ORDER — DULOXETINE HCL 30 MG PO CPEP
30.0000 mg | ORAL_CAPSULE | Freq: Every day | ORAL | 0 refills | Status: DC
Start: 2020-07-14 — End: 2020-07-19

## 2020-07-14 MED ORDER — OXYCODONE HCL 5 MG PO TABS: 5.0000 mg | ORAL_TABLET | Freq: Two times a day (BID) | ORAL | 0 refills | Status: DC | PRN

## 2020-07-14 NOTE — Progress Notes (Signed)
His lipid Hematology/Oncology  Follow Up note [facility] [phone] [phone]   Patient Care Team: Albina Billet, MD as PCP - General (Internal Medicine) Albina Billet, MD (Internal Medicine) Bary Castilla, Forest Gleason, MD (General Surgery) Noreene Filbert, MD as Referring Physician (Radiation Oncology) Earlie Server, MD as Consulting Physician (Oncology)  CHIEF COMPLAINTS/PURPOSE OF CONSULTATION:  Follow up for head and neck cancer.  HISTORY OF PRESENTING ILLNESS:  Ivan Garcia. is a  64 y.o.  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 P16 positive. cT2 cN2 disease, stage II, # Concurrent ChemoRT  Cisplatin 100 mg/m2 q3weeks with concurrent RT which started on 12/31/2017.  S/p one dose of Cisplatin. Cisplatin discontinued due to nephrotoxicity. Switch to weekly Carboplatin (AUC 2) / Taxol 64m/m2 [ finished May 2019] Patient has finished concurrent chemoradiation in May 2019  # He reports doing well until in July 2020, he started to have hoarseness after singing for 2 to 3 hours. He has changed his insurance and Dr. MTami Ribasis no longer covered by his current insurance.  So he establish care with Dr.Burtis Imhoff [facility]iann [facility] on 08/04/2019. Flexible laryngoscopy showed Vocal cord paralysis.  Patient was recommended to have a PET scan done for restaging. Patient's primary care provider Dr. THall Busingordered a PET scan for patient which was done on 08/20/2019. 08/20/2019 PET scan showed asymmetric hypermetabolic him involving the left vocal cords with hypermetabolic adenopathy in the neck, and the chest.  He also has hypermetabolic pulmonary nodule along the minor fissure which is felt to be metastatic. Tiny proximal left ureteral stone without hydronephrosis.  Possible gallbladder sludge.  Enlarged prostate.   Atherosclerosis.  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  # NGS showed PD-L1 CPS 20, TMB indeterminant, MSI stable,PIK3CA mutation Started on chemotherapy with 6 cycles of carboplatin, 5-FU and Keytruda, finished in February 2021.  And has been on Keytruda maintenance. Initially he had a good response, however progressed whiled on Keytruda maintenance.  PET 04/26/2020 worsening of disease in the chest.  Interval enlargement of the right thoracic inlet and paratracheal lymph nodes that were previously enlarged with new activity in the right hilum corresponding to a lymph node in this location.  Also increased activity in the right upper lobe nodule. Right level 3 lymph node in the neck and anterior mediastinal nodal activity is diminished.  These areas are quite small on prior study. Diminished symmetric uptake within the left as compared to the right vocal cord. Right mandible increased activity in the area of third molar.  This is consistent with his recent tooth infection.  [facility]  Patient was offered for clinical trial with pembrolizumab/lenvatinib versus standard therapy. Patient received additional 1 cycle of Keytruda while waiting to be enrolled to the trial.   #Disease progression.  [facility] right upper paratracheal node is concerning for metastatic disease and a new from 01/20/2020 PET/CT.  The node closely abuts and possibly invade the posterolateral right tracheal wall as well as the esophagus.  Right vocal cord paralysis.  Symmetric soft tissue density at the right base of tongue could reflect residual/recurrent primary malignancy versus posttreatment  changes.  Right supraclavicular lymph node is no longer appreciated with post treatment  changes.  There is increased size of right minor fissure nodule which was metabolically avid on previous PET scan.  New mediastinal and hilar lymphadenopathy concerning for new nodal disease.  No evidence of metastatic disease below the diaphragm.  Nonobstructing 3 mm distal ureter stone.  07/07/2020 started on palliative chemotherapy with docetaxel  INTERVAL HISTORY Ivan Garcia. is a 64 y.o. male who presents for follow-up for metastatic squamous cell carcinoma, P 16+.   Patient has received docetaxel 1 week ago.  With G-CSF support. Patient reports generalized bone pain, more on his bilateral lower extremities.  Also burning sensations of fingertips.  Symptoms started before he receives G-CSF support.  Appetite is fair.  No mouth sores.  No fever or chills, or diarrhea.  Swallowing difficulties have improved.   Review of Systems  Constitutional: Negative for chills, fever, malaise/fatigue and weight loss.  HENT: Negative for sore throat.   Eyes: Negative for redness.  Respiratory: Negative for cough, shortness of breath and wheezing.   Cardiovascular: Negative for chest pain, palpitations and leg swelling.  Gastrointestinal: Negative for abdominal pain, blood in stool, nausea and vomiting.       Some swallowing difficulty with solid food.  No difficulty with liquid or soft food.  Genitourinary: Negative for dysuria.  Musculoskeletal: Negative for myalgias.  Skin: Negative for rash.  Neurological: Negative for dizziness, tingling and tremors.  Endo/Heme/Allergies: Does not bruise/bleed easily.  Psychiatric/Behavioral: Negative for hallucinations.    MEDICAL HISTORY:  Past Medical History:  Diagnosis Date  . Arthritis   . Cancer ([place])    Head and neck cancer  . Hemorrhoids   . Hyperlipidemia   . Hypertension     SURGICAL HISTORY: Past Surgical History:  Procedure Laterality Date  . COLONOSCOPY WITH PROPOFOL N/A 04/04/2017   Procedure: COLONOSCOPY WITH PROPOFOL;   Surgeon: Robert Bellow, MD;  Location: [facility] ENDOSCOPY;  Service: Endoscopy;  Laterality: N/A;  . MANDIBLE SURGERY    . PORT-A-CATH REMOVAL  07/17/2018  . PORTA CATH INSERTION N/A 12/19/2017   Procedure: PORTA CATH INSERTION;  Surgeon: Algernon Huxley, MD;  Location: [place] CV LAB;  Service: Cardiovascular;  Laterality: N/A;  . PORTA CATH INSERTION N/A 07/17/2018   Procedure: PORTA CATH INSERTION;  Surgeon: Algernon Huxley, MD;  Location: [place] CV LAB;  Service: Cardiovascular;  Laterality: N/A;  . PORTA CATH INSERTION N/A 09/18/2019   Procedure: PORTA CATH INSERTION;  Surgeon: Algernon Huxley, MD;  Location: [place] CV LAB;  Service: Cardiovascular;  Laterality: N/A;  . TONSILLECTOMY      SOCIAL HISTORY: Social History   Socioeconomic History  . Marital status: Single    Spouse name: Not on file  . Number of children: Not on file  . Years of education: Not on file  . Highest education level: Not on file  Occupational History  . Not on file  Tobacco Use  . Smoking status: Never Smoker  . Smokeless tobacco: Never Used  Vaping Use  . Vaping Use: Never used  Substance and Sexual Activity  . Alcohol use: No  . Drug use: No  . Sexual activity: Not on file  Other Topics Concern  . Not on file  Social History Narrative  . Not on file   Social Determinants of Health   Financial Resource Strain:   . Difficulty of Paying Living Expenses:   Food Insecurity:   .  Worried About Charity fundraiser in the Last Year:   . Arboriculturist in the Last Year:   Transportation Needs:   . Film/video editor (Medical):   Marland Kitchen Lack of Transportation (Non-Medical):   Physical Activity:   . Days of Exercise per Week:   . Minutes of Exercise per Session:   Stress:   . Feeling of Stress :   Social Connections:   . Frequency of Communication with Friends and Family:   . Frequency of Social Gatherings with Friends and Family:   . Attends Religious Services:   . Active Member of  Clubs or Organizations:   . Attends Archivist Meetings:   Marland Kitchen Marital Status:   Intimate Partner Violence:   . Fear of Current or Ex-Partner:   . Emotionally Abused:   Marland Kitchen Physically Abused:   . Sexually Abused:     FAMILY HISTORY: Family History  Problem Relation Age of Onset  . Breast cancer Mother   . Asthma Mother   . Congestive Heart Failure Mother   . Prostate cancer Father   . Brain cancer Father   . Bladder Cancer Father     ALLERGIES:  has No Known Allergies.  MEDICATIONS:  Current Outpatient Medications  Medication Sig Dispense Refill  . amLODipine (NORVASC) 5 MG tablet Take 5 mg by mouth daily.    . Multiple Vitamins-Minerals (MULTIVITAMIN WITH MINERALS) tablet Take 1 tablet by mouth daily.    Marland Kitchen omeprazole (PRILOSEC) 20 MG capsule TAKE 1 CAPSULE BY MOUTH EVERY DAY (Patient taking differently: Take 20 mg by mouth daily as needed. ) 90 capsule 1  . ondansetron (ZOFRAN) 8 MG tablet Take 1 tablet (8 mg total) by mouth 2 (two) times daily as needed for refractory nausea / vomiting. Start on day 3 after carboplatin chemo. 30 tablet 1  . rosuvastatin (CRESTOR) 10 MG tablet Take 10 mg by mouth every evening.  4  . DULoxetine (CYMBALTA) 30 MG capsule Take 1 capsule (30 mg total) by mouth daily. 30 capsule 0  . lidocaine (XYLOCAINE) 2 % solution  (Patient not taking: Reported on 07/06/2020)    . lidocaine-prilocaine (EMLA) cream Apply to affected area once (Patient not taking: Reported on 07/14/2020) 30 g 3  . nystatin (MYCOSTATIN) 100000 UNIT/ML suspension Take 5 mLs (500,000 Units total) by mouth 4 (four) times daily. (Patient not taking: Reported on 07/14/2020) 473 mL 0  . oxyCODONE (ROXICODONE) 5 MG immediate release tablet Take 1 tablet (5 mg total) by mouth every 12 (twelve) hours as needed for moderate pain, severe pain or breakthrough pain. 30 tablet 0  . prochlorperazine (COMPAZINE) 10 MG tablet Take 1 tablet (10 mg total) by mouth every 6 (six) hours as needed  (Nausea or vomiting). (Patient not taking: Reported on 05/14/2020) 30 tablet 1   No current facility-administered medications for this visit.   Facility-Administered Medications Ordered in Other Visits  Medication Dose Route Frequency Provider Last Rate Last Admin  . sodium chloride flush (NS) 0.9 % injection 10 mL  10 mL Intravenous PRN Earlie Server, MD         PHYSICAL EXAMINATION: ECOG PERFORMANCE STATUS: 1 - Symptomatic but completely ambulatory Vitals:   07/14/20 1038  BP: 123/84  Pulse: 99  Resp: 18  Temp: (!) 96.7 F (35.9 C)   Physical Exam Constitutional:      General: He is not in acute distress.    Appearance: He is not diaphoretic.  HENT:  Head: Normocephalic and atraumatic.     Nose: Nose normal.     Mouth/Throat:     Pharynx: No oropharyngeal exudate.  Eyes:     General: No scleral icterus.    Pupils: Pupils are equal, round, and reactive to light.  Cardiovascular:     Rate and Rhythm: Normal rate and regular rhythm.     Heart sounds: No murmur heard.   Pulmonary:     Effort: Pulmonary effort is normal. No respiratory distress.     Breath sounds: No rales.  Chest:     Chest wall: No tenderness.  Abdominal:     General: There is no distension.     Palpations: Abdomen is soft.     Tenderness: There is no abdominal tenderness.  Musculoskeletal:        General: Normal range of motion.     Cervical back: Normal range of motion and neck supple.  Skin:    General: Skin is warm and dry.     Findings: No erythema.  Neurological:     Mental Status: He is alert and oriented to person, place, and time.     Cranial Nerves: No cranial nerve deficit.     Motor: No abnormal muscle tone.     Coordination: Coordination normal.  Psychiatric:        Mood and Affect: Affect normal.      LABORATORY DATA:  I have reviewed the data as listed Lab Results  Component Value Date   WBC 24.7 (H) 07/14/2020   HGB 12.9 (L) 07/14/2020   HCT 38.1 (L) 07/14/2020   MCV  88.2 07/14/2020   PLT 213 07/14/2020   Recent Labs    05/14/20 0812 07/06/20 0813 07/14/20 1022  NA 140 138 138  K 4.2 4.1 4.0  CL 107 104 102  CO2 '25 26 27  '$ GLUCOSE 104* 97 124*  BUN 30* 26* 21  CREATININE 1.31* 1.40* 1.50*  CALCIUM 9.0 9.2 9.1  GFRNONAA 57* 52* 48*  GFRAA >60 >60 56*  PROT 7.0 7.2 7.0  ALBUMIN 3.9 3.8 3.6  AST '18 16 22  '$ ALT '21 16 18  '$ ALKPHOS 79 83 109  BILITOT 0.9 0.9 0.5  RADIOGRAPHIC STUDIES: I have personally reviewed the radiological images as listed and agreed with the findings in the report. NM PET Image Restag (PS) Skull Base To Thigh  Addendum Date: 04/26/2020   ADDENDUM REPORT: 04/26/2020 17:41 ADDENDUM: Increased activity about the RIGHT mandible in the area of the third molar. Correlate with extraction or any signs of increasing pain that would indicate odontogenic infection or worsening dental caries. Area with limited assessment due to streak artifact. Some of this could also be related to artifact. These results will be called to the ordering clinician or representative by the Radiologist Assistant, and communication documented in the PACS or [employer]. Electronically Signed   By: Zetta Bills M.D.   On: 04/26/2020 17:41   Result Date: 04/26/2020 CLINICAL DATA:  Subsequent treatment strategy for head neck cancer. EXAM: NUCLEAR MEDICINE PET SKULL BASE TO THIGH TECHNIQUE: 10.68 mCi F-18 FDG was injected intravenously. Full-ring PET imaging was performed from the skull base to thigh after the radiotracer. CT data was obtained and used for attenuation correction and anatomic localization. Fasting blood glucose: 86 mg/dl COMPARISON:  Multiple prior studies most recent from January 22, 2020 FINDINGS: Mediastinal blood pool activity: SUV max 2.1 Liver activity: SUV max NA NECK: No hypermetabolic activity aside from a high RIGHT paratracheal lymph  node just at or below the thoracic inlet, shown to be enlarge in September of 2020 but with resolution on  more recent exams. See below. The RIGHT level III lymph node seen on the prior study is unchanged posterior to the sternocleidomastoid musculature not well-defined and with some artifact in the area that makes assessment of metabolic activity difficult. Diminished activity associated with the LEFT vocal cord. Incidental CT findings: None CHEST: High RIGHT paratracheal lymph node is enlarged 2.0 x 1.8 cm (SUVmax = 12.9) previously 2.8 and with only minimal enlargement (image 71, series 3) Two adjacent small RIGHT paratracheal lymph nodes approximately 9 mm short axis on images 84-88. (SUVmax = 6.2) previously no hypermetabolic activity in this location or enlarged nodal tissue. RIGHT upper lobe spiculated nodule along the fissure measuring approximately 12 mm within 1 mm of prior measurement (image 109, series 3) (SUVmax = 4.2) previously 2.5 RIGHT hilar activity (image 97, series 3) likely a juxta hilar lymph node actually seen as a discrete finding potentially on image 101 measuring 9 mm (SUVmax = 8.3) no activity seen in this location on the most recent prior Anterior mediastinal activity is resolved. Soft tissue is not well-defined, less defined in the on the previous study. Incidental CT findings: Calcified coronary artery disease and calcified atherosclerotic changes similar to the prior study. Heart size is stable without pericardial fluid. ABDOMEN/PELVIS: Heterogeneous hepatic uptake. No visible lesion on CT. Hepatic cyst in the LEFT hepatic lobe similar to the prior study. No suspicious focal area of increased uptake in the abdomen or in the pelvis. Incidental CT findings: Calcified atherosclerosis of the abdominal aorta. No aneurysmal dilation. No acute gastrointestinal process with colonic diverticulosis. Stable presumed hepatic cyst. SKELETON: Increased mandibular activity on the RIGHT with potential interval extraction of RIGHT mandibular third molar. Diminished diffuse marrow activity that was seen on  the previous study. No focal hypermetabolic area to suggest skeletal metastasis. Incidental CT findings: none IMPRESSION: 1. Worsening of disease in the chest as described above. Interval enlargement of RIGHT thoracic inlet and paratracheal lymph nodes that were previously enlarged with new activity in the RIGHT hilum corresponding to a lymph node in this location. Also with increased activity in the RIGHT upper lobe nodule. 2. RIGHT level III lymph node in the neck and anterior mediastinal nodal activity is diminished, these areas were quite small on the prior study. 3. Diminished asymmetry of uptake within the LEFT as compared to the RIGHT vocal cord, attention on follow-up. Electronically Signed: By: Zetta Bills M.D. On: 04/26/2020 11:37     ASSESSMENT & PLAN:   1. 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[place])   2. Bone pain   3. Chemotherapy-induced neuropathy (HCC)   4. 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 Overall he tolerates well. Labs are reviewed and discussed with patient.  #Generalized bone pain after chemotherapy/chemotherapy-induced neuropathy Patient has tried Tylenol, NSAIDs with no significant improvement.  He asked for some stronger pain medication for pain control after future chemotherapy.  Discussed with patient and advised him to avoid NSAIDs. I prescribed him 60 tablets of oxycodone 5 mg to be used every 12 hours as needed. Start Cymbalta 20 mg daily. Chronic kidney disease, avoid nephrotoxin including NSAIDs  The patient knows to call the clinic with any problems questions or concerns. Keep her next appointment.  Already scheduled. Earlie Server, MD, PhD Hematology Oncology  [place] [facility] 07/14/2020

## 2020-07-19 ENCOUNTER — Other Ambulatory Visit: Payer: Self-pay | Admitting: Oncology

## 2020-07-27 ENCOUNTER — Other Ambulatory Visit: Payer: Self-pay

## 2020-07-27 ENCOUNTER — Inpatient Hospital Stay (HOSPITAL_BASED_OUTPATIENT_CLINIC_OR_DEPARTMENT_OTHER): Payer: Medicare Other | Admitting: Oncology

## 2020-07-27 ENCOUNTER — Encounter: Payer: Self-pay | Admitting: Oncology

## 2020-07-27 ENCOUNTER — Inpatient Hospital Stay: Payer: Medicare Other

## 2020-07-27 VITALS — BP 122/85 | HR 93 | Temp 98.3°F | Resp 18 | Wt 192.5 lb

## 2020-07-27 DIAGNOSIS — N1831 Chronic kidney disease, stage 3a: Secondary | ICD-10-CM | POA: Diagnosis not present

## 2020-07-27 DIAGNOSIS — Z5112 Encounter for antineoplastic immunotherapy: Secondary | ICD-10-CM | POA: Diagnosis not present

## 2020-07-27 DIAGNOSIS — C109 Malignant neoplasm of oropharynx, unspecified: Secondary | ICD-10-CM | POA: Diagnosis not present

## 2020-07-27 DIAGNOSIS — G620 Drug-induced polyneuropathy: Secondary | ICD-10-CM | POA: Diagnosis not present

## 2020-07-27 DIAGNOSIS — Z5111 Encounter for antineoplastic chemotherapy: Secondary | ICD-10-CM | POA: Diagnosis not present

## 2020-07-27 DIAGNOSIS — T451X5A Adverse effect of antineoplastic and immunosuppressive drugs, initial encounter: Secondary | ICD-10-CM

## 2020-07-27 DIAGNOSIS — Z7189 Other specified counseling: Secondary | ICD-10-CM

## 2020-07-27 LAB — COMPREHENSIVE METABOLIC PANEL
ALT: 16 U/L (ref 0–44)
AST: 16 U/L (ref 15–41)
Albumin: 3.5 g/dL (ref 3.5–5.0)
Alkaline Phosphatase: 86 U/L (ref 38–126)
Anion gap: 7 (ref 5–15)
BUN: 20 mg/dL (ref 8–23)
CO2: 27 mmol/L (ref 22–32)
Calcium: 9 mg/dL (ref 8.9–10.3)
Chloride: 103 mmol/L (ref 98–111)
Creatinine, Ser: 1.34 mg/dL — ABNORMAL HIGH (ref 0.61–1.24)
GFR calc Af Amer: >60 mL/min (ref >60)
GFR calc non Af Amer: 55 mL/min — ABNORMAL LOW (ref >60)
Glucose, Bld: 101 mg/dL — ABNORMAL HIGH (ref 70–99)
Potassium: 3.8 mmol/L (ref 3.5–5.1)
Sodium: 137 mmol/L (ref 135–145)
Total Bilirubin: 0.7 mg/dL (ref 0.3–1.2)
Total Protein: 7.2 g/dL (ref 6.5–8.1)

## 2020-07-27 LAB — CBC WITH DIFFERENTIAL/PLATELET
Abs Immature Granulocytes: 0.03 10*3/uL (ref 0.00–0.07)
Basophils Absolute: 0.2 10*3/uL — ABNORMAL HIGH (ref 0.0–0.1)
Basophils Relative: 2 %
Eosinophils Absolute: 0.1 10*3/uL (ref 0.0–0.5)
Eosinophils Relative: 1 %
HCT: 38.6 % — ABNORMAL LOW (ref 39.0–52.0)
Hemoglobin: 13 g/dL (ref 13.0–17.0)
Immature Granulocytes: 0 %
Lymphocytes Relative: 7 %
Lymphs Abs: 0.5 10*3/uL — ABNORMAL LOW (ref 0.7–4.0)
MCH: 29.5 pg (ref 26.0–34.0)
MCHC: 33.7 g/dL (ref 30.0–36.0)
MCV: 87.7 fL (ref 80.0–100.0)
Monocytes Absolute: 0.7 10*3/uL (ref 0.1–1.0)
Monocytes Relative: 9 %
Neutro Abs: 6.4 10*3/uL (ref 1.7–7.7)
Neutrophils Relative %: 81 %
Platelets: 389 10*3/uL (ref 150–400)
RBC: 4.4 MIL/uL (ref 4.22–5.81)
RDW: 13.5 % (ref 11.5–15.5)
WBC: 7.9 10*3/uL (ref 4.0–10.5)
nRBC: 0 % (ref 0.0–0.2)

## 2020-07-27 MED ORDER — SODIUM CHLORIDE 0.9 % IV SOLN
Freq: Once | INTRAVENOUS | Status: AC
  Filled 2020-07-27: qty 250

## 2020-07-27 MED ORDER — HEPARIN SOD (PORK) LOCK FLUSH 100 UNIT/ML IV SOLN
500.0000 [IU] | Freq: Once | INTRAVENOUS | Status: AC | PRN
  Administered 2020-07-27: 500 [IU]
  Filled 2020-07-27: qty 5

## 2020-07-27 MED ORDER — SODIUM CHLORIDE 0.9% FLUSH
10.0000 mL | Freq: Once | INTRAVENOUS | Status: AC
  Administered 2020-07-27: 10 mL
  Filled 2020-07-27: qty 10

## 2020-07-27 MED ORDER — SODIUM CHLORIDE 0.9 % IV SOLN
75.0000 mg/m2 | Freq: Once | INTRAVENOUS | Status: AC
  Administered 2020-07-27: 150 mg via INTRAVENOUS
  Filled 2020-07-27: qty 15

## 2020-07-27 MED ORDER — HEPARIN SOD (PORK) LOCK FLUSH 100 UNIT/ML IV SOLN
INTRAVENOUS | Status: AC
  Filled 2020-07-27: qty 5

## 2020-07-27 MED ORDER — SODIUM CHLORIDE 0.9 % IV SOLN
10.0000 mg | Freq: Once | INTRAVENOUS | Status: AC
  Administered 2020-07-27: 10 mg via INTRAVENOUS
  Filled 2020-07-27: qty 10

## 2020-07-27 NOTE — Progress Notes (Signed)
His lipid Hematology/Oncology  Follow Up note [facility] [phone] [phone]   Patient Care Team: Albina Billet, MD as PCP - General (Internal Medicine) Albina Billet, MD (Internal Medicine) Bary Castilla, Forest Gleason, MD (General Surgery) Noreene Filbert, MD as Referring Physician (Radiation Oncology) Earlie Server, MD as Consulting Physician (Oncology)  CHIEF COMPLAINTS/PURPOSE OF CONSULTATION:  Follow up for head and neck cancer.  HISTORY OF PRESENTING ILLNESS:  Ivan Garcia. is a  64 y.o.  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 P16 positive. cT2 cN2 disease, stage II, # Concurrent ChemoRT  Cisplatin 100 mg/m2 q3weeks with concurrent RT which started on 12/31/2017.  S/p one dose of Cisplatin. Cisplatin discontinued due to nephrotoxicity. Switch to weekly Carboplatin (AUC 2) / Taxol 64m/m2 [ finished May 2019] Patient has finished concurrent chemoradiation in May 2019  # He reports doing well until in July 2020, he started to have hoarseness after singing for 2 to 3 hours. He has changed his insurance and Dr. MTami Ribasis no longer covered by his current insurance.  So he establish [facility]. Flexible laryngoscopy showed Vocal cord paralysis.  Patient was recommended to have a PET scan done for restaging. Patient's primary care provider Dr. THall Busingordered a PET scan for patient which was done on 08/20/2019. 08/20/2019 PET scan showed asymmetric hypermetabolic him involving the left vocal cords with hypermetabolic adenopathy in the neck, and the chest.  He also has hypermetabolic pulmonary nodule along the minor fissure which is felt to be metastatic. Tiny proximal left ureteral stone without hydronephrosis.  Possible gallbladder sludge.  Enlarged prostate.   Atherosclerosis.  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  # NGS showed PD-L1 CPS 20, TMB indeterminant, MSI stable,PIK3CA mutation Started on chemotherapy with 6 cycles of carboplatin, 5-FU and Keytruda, finished in February 2021.  And has been on Keytruda maintenance. Initially he had a good response, however progressed whiled on Keytruda maintenance.  PET 04/26/2020 worsening of disease in the chest.  Interval enlargement of the right thoracic inlet and paratracheal lymph nodes that were previously enlarged with new activity in the right hilum corresponding to a lymph node in this location.  Also increased activity in the right upper lobe nodule. Right level 3 lymph node in the neck and anterior mediastinal nodal activity is diminished.  These areas are quite small on prior study. Diminished symmetric uptake within the left as compared to the right vocal cord. Right mandible increased activity in the area of third molar.  This is consistent with his recent tooth infection.  [facility]  Patient was offered for clinical trial with pembrolizumab/lenvatinib versus standard therapy. Patient received additional 1 cycle of Keytruda while waiting to be enrolled to the trial.   #Disease progression.  [facility] right upper paratracheal node is concerning for metastatic disease and a new from 01/20/2020 PET/CT.  The node closely abuts and possibly invade the posterolateral right tracheal wall as well as the esophagus.  Right vocal cord paralysis.  Symmetric soft tissue density at the right base of tongue could reflect residual/recurrent primary malignancy versus posttreatment  changes.  Right supraclavicular lymph node is no longer appreciated with post treatment  changes.  There is increased size of right minor fissure nodule which was metabolically avid on previous PET scan.  New mediastinal and hilar lymphadenopathy concerning for new nodal disease.  No evidence of metastatic disease below the diaphragm.  Nonobstructing 3 mm distal ureter stone.  07/07/2020 started on palliative chemotherapy with docetaxel  INTERVAL HISTORY Ivan Garcia. is a 64 y.o. male who presents for follow-up for metastatic squamous cell carcinoma, P 16+.   Patient has received docetaxel cycle 1 3 weeks ago with G-CSF support. He tolerates well. Swallowing difficulty symptom remains stable.  He is able to eat most solid food and liquid.  He has some difficulties eating steak or dry solid food Denies any fever, chills, sore throat.  Patient has not vaccinated for COVID-19.  Review of Systems  Constitutional: Negative for chills, fever, malaise/fatigue and weight loss.  HENT: Negative for sore throat.   Eyes: Negative for redness.  Respiratory: Negative for cough, shortness of breath and wheezing.   Cardiovascular: Negative for chest pain, palpitations and leg swelling.  Gastrointestinal: Negative for abdominal pain, blood in stool, nausea and vomiting.       Some swallowing difficulty with solid food.  No difficulty with liquid or soft food.  Genitourinary: Negative for dysuria.  Musculoskeletal: Negative for myalgias.  Skin: Negative for rash.  Neurological: Negative for dizziness, tingling and tremors.  Endo/Heme/Allergies: Does not bruise/bleed easily.  Psychiatric/Behavioral: Negative for hallucinations.    MEDICAL HISTORY:  Past Medical History:  Diagnosis Date  . Arthritis   . Cancer ([place])    Head and neck cancer  . Hemorrhoids   . Hyperlipidemia   . Hypertension     SURGICAL HISTORY: Past Surgical History:  Procedure Laterality Date  . COLONOSCOPY WITH PROPOFOL N/A 04/04/2017   Procedure: COLONOSCOPY WITH PROPOFOL;  Surgeon: Robert Bellow, MD;   Location[facility] ENDOSCOPY;  Service: Endoscopy;  Laterality: N/A;  . MANDIBLE SURGERY    . PORT-A-CATH REMOVAL  07/17/2018  . PORTA CATH INSERTION N/A 12/19/2017   Procedure: PORTA CATH INSERTION;  Surgeon: Algernon Huxley, MD;  Location: [place] CV LAB;  Service: Cardiovascular;  Laterality: N/A;  . PORTA CATH INSERTION N/A 07/17/2018   Procedure: PORTA CATH INSERTION;  Surgeon: Algernon Huxley, MD;  Location: [place] CV LAB;  Service: Cardiovascular;  Laterality: N/A;  . PORTA CATH INSERTION N/A 09/18/2019   Procedure: PORTA CATH INSERTION;  Surgeon: Algernon Huxley, MD;  Location: [place] CV LAB;  Service: Cardiovascular;  Laterality: N/A;  . TONSILLECTOMY      SOCIAL HISTORY: Social History   Socioeconomic History  . Marital status: Single    Spouse name: Not on file  . Number of children: Not on file  . Years of education: Not on file  . Highest education level: Not on file  Occupational History  . Not on file  Tobacco Use  . Smoking status: Never Smoker  . Smokeless tobacco: Never Used  Vaping Use  . Vaping Use: Never used  Substance and Sexual Activity  . Alcohol use: No  . Drug use: No  . Sexual activity: Not on file  Other Topics Concern  . Not on file  Social History Narrative  . Not on file   Social Determinants of Health   Financial Resource Strain:   . Difficulty of Paying Living Expenses: Not on file  Food Insecurity:   .  Worried About Charity fundraiser in the Last Year: Not on file  . Ran Out of Food in the Last Year: Not on file  Transportation Needs:   . Lack of Transportation (Medical): Not on file  . Lack of Transportation (Non-Medical): Not on file  Physical Activity:   . Days of Exercise per Week: Not on file  . Minutes of Exercise per Session: Not on file  Stress:   . Feeling of Stress : Not on file  Social Connections:   . Frequency of Communication with Friends and Family: Not on file  . Frequency of Social Gatherings with Friends  and Family: Not on file  . Attends Religious Services: Not on file  . Active Member of Clubs or Organizations: Not on file  . Attends Archivist Meetings: Not on file  . Marital Status: Not on file  Intimate Partner Violence:   . Fear of Current or Ex-Partner: Not on file  . Emotionally Abused: Not on file  . Physically Abused: Not on file  . Sexually Abused: Not on file    FAMILY HISTORY: Family History  Problem Relation Age of Onset  . Breast cancer Mother   . Asthma Mother   . Congestive Heart Failure Mother   . Prostate cancer Father   . Brain cancer Father   . Bladder Cancer Father     ALLERGIES:  has No Known Allergies.  MEDICATIONS:  Current Outpatient Medications  Medication Sig Dispense Refill  . amLODipine (NORVASC) 5 MG tablet Take 5 mg by mouth daily.    . DULoxetine (CYMBALTA) 30 MG capsule TAKE 1 CAPSULE BY MOUTH EVERY DAY 30 capsule 0  . lidocaine (XYLOCAINE) 2 % solution  (Patient not taking: Reported on 07/06/2020)    . lidocaine-prilocaine (EMLA) cream Apply to affected area once (Patient not taking: Reported on 07/14/2020) 30 g 3  . Multiple Vitamins-Minerals (MULTIVITAMIN WITH MINERALS) tablet Take 1 tablet by mouth daily.    Marland Kitchen nystatin (MYCOSTATIN) 100000 UNIT/ML suspension Take 5 mLs (500,000 Units total) by mouth 4 (four) times daily. (Patient not taking: Reported on 07/14/2020) 473 mL 0  . omeprazole (PRILOSEC) 20 MG capsule TAKE 1 CAPSULE BY MOUTH EVERY DAY (Patient taking differently: Take 20 mg by mouth daily as needed. ) 90 capsule 1  . ondansetron (ZOFRAN) 8 MG tablet Take 1 tablet (8 mg total) by mouth 2 (two) times daily as needed for refractory nausea / vomiting. Start on day 3 after carboplatin chemo. 30 tablet 1  . oxyCODONE (ROXICODONE) 5 MG immediate release tablet Take 1 tablet (5 mg total) by mouth every 12 (twelve) hours as needed for moderate pain, severe pain or breakthrough pain. 30 tablet 0  . prochlorperazine (COMPAZINE) 10 MG  tablet Take 1 tablet (10 mg total) by mouth every 6 (six) hours as needed (Nausea or vomiting). (Patient not taking: Reported on 05/14/2020) 30 tablet 1  . rosuvastatin (CRESTOR) 10 MG tablet Take 10 mg by mouth every evening.  4   No current facility-administered medications for this visit.   Facility-Administered Medications Ordered in Other Visits  Medication Dose Route Frequency Provider Last Rate Last Admin  . sodium chloride flush (NS) 0.9 % injection 10 mL  10 mL Intravenous PRN Earlie Server, MD         PHYSICAL EXAMINATION: ECOG PERFORMANCE STATUS: 1 - Symptomatic but completely ambulatory Vitals:   07/27/20 0848  BP: 122/85  Pulse: 93  Resp: 18  Temp: 98.3 F (36.8 C)  SpO2: 98%   Physical Exam Constitutional:      General: He is not in acute distress.    Appearance: He is not diaphoretic.  HENT:     Head: Normocephalic and atraumatic.     Nose: Nose normal.     Mouth/Throat:     Pharynx: No oropharyngeal exudate.  Eyes:     General: No scleral icterus.    Pupils: Pupils are equal, round, and reactive to light.  Cardiovascular:     Rate and Rhythm: Normal rate and regular rhythm.     Heart sounds: No murmur heard.   Pulmonary:     Effort: Pulmonary effort is normal. No respiratory distress.     Breath sounds: No rales.  Chest:     Chest wall: No tenderness.  Abdominal:     General: There is no distension.     Palpations: Abdomen is soft.     Tenderness: There is no abdominal tenderness.  Musculoskeletal:        General: Normal range of motion.     Cervical back: Normal range of motion and neck supple.  Skin:    General: Skin is warm and dry.     Findings: No erythema.  Neurological:     Mental Status: He is alert and oriented to person, place, and time.     Cranial Nerves: No cranial nerve deficit.     Motor: No abnormal muscle tone.     Coordination: Coordination normal.  Psychiatric:        Mood and Affect: Affect normal.      LABORATORY DATA:  I  have reviewed the data as listed Lab Results  Component Value Date   WBC 7.9 07/27/2020   HGB 13.0 07/27/2020   HCT 38.6 (L) 07/27/2020   MCV 87.7 07/27/2020   PLT 389 07/27/2020   Recent Labs    05/14/20 0812 07/06/20 0813 07/14/20 1022  NA 140 138 138  K 4.2 4.1 4.0  CL 107 104 102  CO2 _0 GLUCOSE 104* 97 124*  BUN 30* 26* 21  CREATININE 1.31* 1.40* 1.50*  CALCIUM 9.0 9.2 9.1  GFRNONAA 57* 52* 48*  GFRAA >60 >60 56*  PROT 7.0 7.2 7.0  ALBUMIN 3.9 3.8 3.6  AST _1 ALT _2 ALKPHOS 79 83 109  BILITOT 0.9 0.9 0.5  RADIOGRAPHIC STUDIES: I have personally reviewed the radiological images as listed and agreed with the findings in the report. No results found.   ASSESSMENT & PLAN:   1. 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[place])   2. 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 Stage 3a chronic kidney disease   4. Encounter for antineoplastic immunotherapy   5. 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 Overall he tolerates well.  Labs are reviewed and discussed with patient .  Proceed with cycle 2 docetaxel.  Day 3 G-CSF support. Discussed with patient about referring to palliative care service to establish care.  He understands that he is disease not curable.  #Chemotherapy-induced neuropathy Continue Cymbalta 30 mg daily. Patient also has oxycodone 5 mg he can use every 12 hours as needed for pain. #Stage III CKD, kidney functions are stable.  Avoid nephrotoxins. Patient ask questions about COVID-19 vaccination.  He has not been fully vaccinated yet.  I discussed with him about the CDC recommendation and highly recommend  patient to consider COVID-19 vaccination.  Patient agrees with the plan.  The patient knows to call the clinic with any problems questions or concerns. Follow-up 3 weeks Earlie Server, MD,  PhD Hematology Oncology [facility] at [facility] 07/27/2020

## 2020-07-27 NOTE — Progress Notes (Signed)
Pt  In for follow up, pt is discouraged, states was denied two clinical trials, but hopeful for another one he just recently was informed about.

## 2020-07-29 ENCOUNTER — Inpatient Hospital Stay: Payer: Medicare Other

## 2020-07-29 ENCOUNTER — Other Ambulatory Visit: Payer: Self-pay

## 2020-07-29 DIAGNOSIS — C109 Malignant neoplasm of oropharynx, unspecified: Secondary | ICD-10-CM

## 2020-07-29 DIAGNOSIS — Z5111 Encounter for antineoplastic chemotherapy: Secondary | ICD-10-CM | POA: Diagnosis not present

## 2020-07-29 MED ORDER — PEGFILGRASTIM-JMDB 6 MG/0.6ML ~~LOC~~ SOSY
6.0000 mg | PREFILLED_SYRINGE | Freq: Once | SUBCUTANEOUS | Status: AC
  Administered 2020-07-29: 6 mg via SUBCUTANEOUS
  Filled 2020-07-29: qty 0.6

## 2020-08-17 ENCOUNTER — Inpatient Hospital Stay: Payer: Medicare Other | Attending: Oncology

## 2020-08-17 ENCOUNTER — Inpatient Hospital Stay: Payer: Medicare Other

## 2020-08-17 ENCOUNTER — Inpatient Hospital Stay (HOSPITAL_BASED_OUTPATIENT_CLINIC_OR_DEPARTMENT_OTHER): Payer: Medicare Other | Admitting: Oncology

## 2020-08-17 ENCOUNTER — Inpatient Hospital Stay (HOSPITAL_BASED_OUTPATIENT_CLINIC_OR_DEPARTMENT_OTHER): Payer: Medicare Other | Admitting: Hospice and Palliative Medicine

## 2020-08-17 ENCOUNTER — Other Ambulatory Visit: Payer: Self-pay

## 2020-08-17 ENCOUNTER — Encounter: Payer: Self-pay | Admitting: Oncology

## 2020-08-17 VITALS — BP 132/88 | HR 106 | Temp 97.8°F | Resp 16 | Wt 188.3 lb

## 2020-08-17 DIAGNOSIS — C770 Secondary and unspecified malignant neoplasm of lymph nodes of head, face and neck: Secondary | ICD-10-CM

## 2020-08-17 DIAGNOSIS — R1310 Dysphagia, unspecified: Secondary | ICD-10-CM | POA: Diagnosis not present

## 2020-08-17 DIAGNOSIS — C109 Malignant neoplasm of oropharynx, unspecified: Secondary | ICD-10-CM | POA: Diagnosis present

## 2020-08-17 DIAGNOSIS — R531 Weakness: Secondary | ICD-10-CM

## 2020-08-17 DIAGNOSIS — T451X5A Adverse effect of antineoplastic and immunosuppressive drugs, initial encounter: Secondary | ICD-10-CM

## 2020-08-17 DIAGNOSIS — R000 Tachycardia, unspecified: Secondary | ICD-10-CM | POA: Diagnosis not present

## 2020-08-17 DIAGNOSIS — Z8052 Family history of malignant neoplasm of bladder: Secondary | ICD-10-CM

## 2020-08-17 DIAGNOSIS — Z5112 Encounter for antineoplastic immunotherapy: Secondary | ICD-10-CM | POA: Insufficient documentation

## 2020-08-17 DIAGNOSIS — C7800 Secondary malignant neoplasm of unspecified lung: Secondary | ICD-10-CM | POA: Insufficient documentation

## 2020-08-17 DIAGNOSIS — Z8042 Family history of malignant neoplasm of prostate: Secondary | ICD-10-CM | POA: Diagnosis not present

## 2020-08-17 DIAGNOSIS — Z808 Family history of malignant neoplasm of other organs or systems: Secondary | ICD-10-CM | POA: Insufficient documentation

## 2020-08-17 DIAGNOSIS — N1831 Chronic kidney disease, stage 3a: Secondary | ICD-10-CM

## 2020-08-17 DIAGNOSIS — R110 Nausea: Secondary | ICD-10-CM

## 2020-08-17 DIAGNOSIS — D72829 Elevated white blood cell count, unspecified: Secondary | ICD-10-CM | POA: Diagnosis not present

## 2020-08-17 DIAGNOSIS — B370 Candidal stomatitis: Secondary | ICD-10-CM | POA: Insufficient documentation

## 2020-08-17 DIAGNOSIS — Z515 Encounter for palliative care: Secondary | ICD-10-CM

## 2020-08-17 DIAGNOSIS — R634 Abnormal weight loss: Secondary | ICD-10-CM

## 2020-08-17 DIAGNOSIS — G620 Drug-induced polyneuropathy: Secondary | ICD-10-CM | POA: Diagnosis not present

## 2020-08-17 DIAGNOSIS — Z5189 Encounter for other specified aftercare: Secondary | ICD-10-CM | POA: Insufficient documentation

## 2020-08-17 DIAGNOSIS — Z803 Family history of malignant neoplasm of breast: Secondary | ICD-10-CM

## 2020-08-17 DIAGNOSIS — Z5111 Encounter for antineoplastic chemotherapy: Secondary | ICD-10-CM

## 2020-08-17 LAB — CBC WITH DIFFERENTIAL/PLATELET
Abs Immature Granulocytes: 0.05 10*3/uL (ref 0.00–0.07)
Basophils Absolute: 0.2 10*3/uL — ABNORMAL HIGH (ref 0.0–0.1)
Basophils Relative: 2 %
Eosinophils Absolute: 0 10*3/uL (ref 0.0–0.5)
Eosinophils Relative: 1 %
HCT: 36.7 % — ABNORMAL LOW (ref 39.0–52.0)
Hemoglobin: 12.2 g/dL — ABNORMAL LOW (ref 13.0–17.0)
Immature Granulocytes: 1 %
Lymphocytes Relative: 6 %
Lymphs Abs: 0.5 10*3/uL — ABNORMAL LOW (ref 0.7–4.0)
MCH: 29.6 pg (ref 26.0–34.0)
MCHC: 33.2 g/dL (ref 30.0–36.0)
MCV: 89.1 fL (ref 80.0–100.0)
Monocytes Absolute: 0.8 10*3/uL (ref 0.1–1.0)
Monocytes Relative: 10 %
Neutro Abs: 6.6 10*3/uL (ref 1.7–7.7)
Neutrophils Relative %: 80 %
Platelets: 377 10*3/uL (ref 150–400)
RBC: 4.12 MIL/uL — ABNORMAL LOW (ref 4.22–5.81)
RDW: 14.6 % (ref 11.5–15.5)
WBC: 8.2 10*3/uL (ref 4.0–10.5)
nRBC: 0 % (ref 0.0–0.2)

## 2020-08-17 LAB — COMPREHENSIVE METABOLIC PANEL
ALT: 16 U/L (ref 0–44)
AST: 19 U/L (ref 15–41)
Albumin: 3.5 g/dL (ref 3.5–5.0)
Alkaline Phosphatase: 82 U/L (ref 38–126)
Anion gap: 9 (ref 5–15)
BUN: 23 mg/dL (ref 8–23)
CO2: 25 mmol/L (ref 22–32)
Calcium: 9.2 mg/dL (ref 8.9–10.3)
Chloride: 105 mmol/L (ref 98–111)
Creatinine, Ser: 1.52 mg/dL — ABNORMAL HIGH (ref 0.61–1.24)
GFR calc Af Amer: 55 mL/min — ABNORMAL LOW (ref >60)
GFR calc non Af Amer: 47 mL/min — ABNORMAL LOW (ref >60)
Glucose, Bld: 101 mg/dL — ABNORMAL HIGH (ref 70–99)
Potassium: 4 mmol/L (ref 3.5–5.1)
Sodium: 139 mmol/L (ref 135–145)
Total Bilirubin: 0.7 mg/dL (ref 0.3–1.2)
Total Protein: 7.4 g/dL (ref 6.5–8.1)

## 2020-08-17 MED ORDER — OXYCODONE HCL 5 MG PO TABS
5.0000 mg | ORAL_TABLET | Freq: Two times a day (BID) | ORAL | 0 refills | Status: DC | PRN
Start: 2020-08-17 — End: 2020-09-21

## 2020-08-17 MED ORDER — HEPARIN SOD (PORK) LOCK FLUSH 100 UNIT/ML IV SOLN
INTRAVENOUS | Status: AC
  Filled 2020-08-17: qty 5

## 2020-08-17 MED ORDER — SODIUM CHLORIDE 0.9 % IV SOLN
10.0000 mg | Freq: Once | INTRAVENOUS | Status: AC
  Administered 2020-08-17: 10 mg via INTRAVENOUS
  Filled 2020-08-17: qty 10

## 2020-08-17 MED ORDER — NYSTATIN 100000 UNIT/ML MT SUSP: 5.0000 mL | Freq: Three times a day (TID) | OROMUCOSAL | 0 refills | Status: DC

## 2020-08-17 MED ORDER — SODIUM CHLORIDE 0.9% FLUSH
10.0000 mL | Freq: Once | INTRAVENOUS | Status: AC
  Administered 2020-08-17: 10 mL via INTRAVENOUS
  Filled 2020-08-17: qty 10

## 2020-08-17 MED ORDER — SODIUM CHLORIDE 0.9 % IV SOLN
Freq: Once | INTRAVENOUS | Status: AC
  Filled 2020-08-17: qty 250

## 2020-08-17 MED ORDER — SODIUM CHLORIDE 0.9 % IV SOLN
75.0000 mg/m2 | Freq: Once | INTRAVENOUS | Status: AC
  Administered 2020-08-17: 150 mg via INTRAVENOUS
  Filled 2020-08-17: qty 15

## 2020-08-17 MED ORDER — HEPARIN SOD (PORK) LOCK FLUSH 100 UNIT/ML IV SOLN
500.0000 [IU] | Freq: Once | INTRAVENOUS | Status: AC
  Administered 2020-08-17: 500 [IU] via INTRAVENOUS
  Filled 2020-08-17: qty 5

## 2020-08-17 NOTE — Progress Notes (Addendum)
[place]  [phone] [phone]   Name: Ivan [facility]. Date: 08/17/2020 MRN: [id number]  DOB: 1956/09/01  Patient Care Team: Albina Billet, MD as PCP - General (Internal Medicine) Albina Billet, MD (Internal Medicine) Bary Castilla Forest Gleason, MD (General Surgery) Noreene Filbert, MD as Referring Physician (Radiation Oncology) Earlie Server, MD as Consulting Physician (Oncology)    REASON FOR CONSULTATION: Ivan Garcia. is a 64 y.o. 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  Most recently on docetaxel.  Patient is referred to palliative care to help address goals and manage ongoing symptoms.  SOCIAL HISTORY:     reports that he has never smoked. He has never used smokeless tobacco. He reports that he does not drink alcohol and does not use drugs.   Patient is unmarried.  He has no children.  He lives at home alone.  He has multiple friends and siblings who are involved in his care social support.  Patient cites his strong Christian faith.  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  ADVANCE DIRECTIVES:  On file  CODE STATUS:   PAST MEDICAL HISTORY: Past Medical History:  Diagnosis Date   Arthritis    Cancer ([place])    Head and neck cancer   Hemorrhoids    Hyperlipidemia    Hypertension     PAST SURGICAL HISTORY:  Past Surgical History:  Procedure Laterality Date   COLONOSCOPY WITH PROPOFOL N/A 04/04/2017   Procedure: COLONOSCOPY WITH PROPOFOL;  Surgeon: Robert Bellow, MD;  Location: [facility] ENDOSCOPY;  Service: Endoscopy;  Laterality: N/A;   MANDIBLE SURGERY     PORT-A-CATH REMOVAL  07/17/2018   PORTA CATH INSERTION N/A 12/19/2017   Procedure: PORTA CATH INSERTION;  Surgeon: Algernon Huxley, MD;   Location: [place] [facility];  Service: Cardiovascular;  Laterality: N/A;   PORTA CATH INSERTION N/A 07/17/2018   Procedure: PORTA CATH INSERTION;  Surgeon: Algernon Huxley, MD;  Location: [place] [facility];  Service: Cardiovascular;  Laterality: N/A;   PORTA CATH INSERTION N/A 09/18/2019   Procedure: PORTA CATH INSERTION;  Surgeon: Algernon Huxley, MD;  Location: [place] [facility];  Service: Cardiovascular;  Laterality: N/A;   TONSILLECTOMY      HEMATOLOGY/ONCOLOGY HISTORY:  Oncology History  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[place])  12/14/2017 Initial Diagnosis   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[place])   09/17/2019 - 07/27/2020 Chemotherapy   The patient had [No matching medication found in this treatment plan]  for chemotherapy treatment.    09/29/2019 - 04/25/2020 Chemotherapy   The patient had palonosetron (ALOXI) injection 0.25 mg, 0.25 mg, Intravenous,  Once, 6 of 6 cycles Administration: 0.25 mg (09/29/2019), 0.25 mg (12/22/2019), 0.25 mg (10/20/2019), 0.25 mg (12/01/2019), 0.25 mg (11/10/2019), 0.25 mg (01/12/2020) pegfilgrastim (NEULASTA ONPRO KIT) injection 6 mg, 6 mg, Subcutaneous, Once, 2 of 2 cycles Administration: 6 mg (12/26/2019), 6 mg (01/16/2020) pembrolizumab (KEYTRUDA) 200 mg in sodium chloride 0.9 % 50 mL chemo infusion, 200 mg (100 % of original dose 200 mg), Intravenous, Once, 10 of 10 cycles Dose modification: 200 mg (original dose 200 mg, Cycle 1, Reason: Provider Judgment) Administration: 200 mg (09/29/2019), 200 mg (10/20/2019), 200 mg (12/01/2019), 200 mg (12/22/2019), 200 mg (11/10/2019), 200 mg (01/12/2020), 200 mg (02/02/2020), 200 mg (02/23/2020), 200 mg (  03/15/2020), 200 mg (04/06/2020) fosaprepitant (EMEND) 150 mg, dexamethasone (DECADRON) 12 mg in sodium chloride 0.9 % 145 mL IVPB, , Intravenous,  Once, 6 of 6 cycles Administration:  (09/29/2019),  (12/22/2019),  (10/20/2019),  (12/01/2019),  (11/10/2019),  (01/12/2020) fluorouracil (ADRUCIL) 7,900 mg in sodium  chloride 0.9 % 92 mL chemo infusion, 1,000 mg/m2/day = 7,900 mg, Intravenous, 4D (96 hours ), 6 of 6 cycles Dose modification: 900 mg/m2/day (original dose 1,000 mg/m2/day, Cycle 5, Reason: Dose not tolerated), 850 mg/m2/day (original dose 1,000 mg/m2/day, Cycle 5, Reason: Dose not tolerated) Administration: 7,900 mg (09/29/2019), 6,750 mg (12/22/2019), 7,900 mg (10/20/2019), 6,750 mg (12/01/2019), 7,150 mg (11/10/2019), 6,750 mg (01/12/2020) CARBOplatin (PARAPLATIN) 500 mg in sodium chloride 0.9 % 250 mL chemo infusion, 500 mg (100 % of original dose 499 mg), Intravenous,  Once, 6 of 6 cycles Dose modification:   (original dose 499 mg, Cycle 1),   (original dose 440 mg, Cycle 5) Administration: 500 mg (09/29/2019), 440 mg (10/20/2019), 390 mg (12/01/2019), 440 mg (11/10/2019), 430 mg (01/12/2020)  for chemotherapy treatment.    04/28/2020 - 04/28/2020 Chemotherapy   The patient had cetuximab (ERBITUX) chemo infusion 800 mg, 400 mg/m2, Intravenous, Once, 0 of 8 cycles  for chemotherapy treatment.    05/14/2020 - 05/14/2020 Chemotherapy   The patient had pembrolizumab (KEYTRUDA) 200 mg in sodium chloride 0.9 % 50 mL chemo infusion, 200 mg, Intravenous, Once, 1 of 4 cycles Administration: 200 mg (05/14/2020)  for chemotherapy treatment.    07/06/2020 -  Chemotherapy   The patient had pegfilgrastim-jmdb (FULPHILA) injection 6 mg, 6 mg, Subcutaneous,  Once, 3 of 4 cycles Administration: 6 mg (07/08/2020), 6 mg (07/29/2020) DOCEtaxel (TAXOTERE) 150 mg in sodium chloride 0.9 % 250 mL chemo infusion, 75 mg/m2 = 150 mg, Intravenous,  Once, 3 of 4 cycles Administration: 150 mg (07/06/2020), 150 mg (07/27/2020), 150 mg (08/17/2020)  for chemotherapy treatment.      ALLERGIES:  has No Known Allergies.  MEDICATIONS:  Current Outpatient Medications  Medication Sig Dispense Refill   amLODipine (NORVASC) 5 MG tablet Take 5 mg by mouth daily.     DULoxetine (CYMBALTA) 30 MG capsule TAKE 1 CAPSULE BY MOUTH EVERY DAY 30  capsule 0   lidocaine (XYLOCAINE) 2 % solution  (Patient not taking: Reported on 07/06/2020)     lidocaine-prilocaine (EMLA) cream Apply to affected area once (Patient not taking: Reported on 07/14/2020) 30 g 3   Multiple Vitamins-Minerals (MULTIVITAMIN WITH MINERALS) tablet Take 1 tablet by mouth daily.     nystatin (MYCOSTATIN) 100000 UNIT/ML suspension Take 5 mLs (500,000 Units total) by mouth in the morning, at noon, and at bedtime. SWISH AND SPIT 473 mL 0   omeprazole (PRILOSEC) 20 MG capsule TAKE 1 CAPSULE BY MOUTH EVERY DAY (Patient taking differently: Take 20 mg by mouth daily as needed. ) 90 capsule 1   ondansetron (ZOFRAN) 8 MG tablet Take 1 tablet (8 mg total) by mouth 2 (two) times daily as needed for refractory nausea / vomiting. Start on day 3 after carboplatin chemo. 30 tablet 1   oxyCODONE (ROXICODONE) 5 MG immediate release tablet Take 1 tablet (5 mg total) by mouth every 12 (twelve) hours as needed for moderate pain, severe pain or breakthrough pain. 30 tablet 0   prochlorperazine (COMPAZINE) 10 MG tablet Take 1 tablet (10 mg total) by mouth every 6 (six) hours as needed (Nausea or vomiting). (Patient not taking: Reported on 07/27/2020) 30 tablet 1   rosuvastatin (CRESTOR) 10 MG tablet Take 10  mg by mouth every evening.  4   No current facility-administered medications for this visit.   Facility-Administered Medications Ordered in Other Visits  Medication Dose Route Frequency Provider Last Rate Last Admin   sodium chloride flush (NS) 0.9 % injection 10 mL  10 mL Intravenous PRN Earlie Server, MD        VITAL SIGNS: There were no vitals taken for this visit. There were no vitals filed for this visit.  Estimated body mass index is 27.02 kg/m as calculated from the following:   Height as of 09/18/19: '5\' 10"'  (1.778 m).   Weight as of an earlier encounter on 08/17/20: 188 lb 4.8 oz (85.4 kg).  LABS: CBC:    Component Value Date/Time   WBC 8.2 08/17/2020 0804   HGB 12.2 (L)  08/17/2020 0804   HCT 36.7 (L) 08/17/2020 0804   PLT 377 08/17/2020 0804   MCV 89.1 08/17/2020 0804   NEUTROABS 6.6 08/17/2020 0804   LYMPHSABS 0.5 (L) 08/17/2020 0804   MONOABS 0.8 08/17/2020 0804   EOSABS 0.0 08/17/2020 0804   BASOSABS 0.2 (H) 08/17/2020 0804   Comprehensive Metabolic Panel:    Component Value Date/Time   NA 139 08/17/2020 0804   K 4.0 08/17/2020 0804   CL 105 08/17/2020 0804   CO2 25 08/17/2020 0804   BUN 23 08/17/2020 0804   CREATININE 1.52 (H) 08/17/2020 0804   GLUCOSE 101 (H) 08/17/2020 0804   CALCIUM 9.2 08/17/2020 0804   AST 19 08/17/2020 0804   ALT 16 08/17/2020 0804   ALKPHOS 82 08/17/2020 0804   BILITOT 0.7 08/17/2020 0804   PROT 7.4 08/17/2020 0804   ALBUMIN 3.5 08/17/2020 0804    RADIOGRAPHIC STUDIES: No results found.  PERFORMANCE STATUS (ECOG) : 1 - Symptomatic but completely ambulatory  Review of Systems Unless otherwise noted, a complete review of systems is negative.  Physical Exam General: NAD Pulmonary: Unlabored Extremities: no edema, no joint deformities Skin: no rashes Neurological: Weakness but otherwise nonfocal  IMPRESSION: I met with patient infusion area.  Introduced palliative care services and attempted to establish therapeutic rapport.  Patient reports that overall he is doing okay.  He says he has more good days and bad.  He has had worsened fatigue on docetaxel.  He also has chronic dysphagia and has had a recent choking event when he tried to eat chicken.  Patient mostly eats soft foods and drinks oral supplements daily.  He has not established care with either nutrition or speech therapy.  We will send referrals to both.  Patient denies pain or other distressing symptoms at present.  Patient says his goals are aligned with continued treatment.  He is optimistic that he will have good response from treatment.  He continues to seek possibility for clinical trial.  However, patient verbalized understanding that his  disease is not curable.    At baseline, he lives at home alone.  He reports being functionally independent with his own care.  He is unmarried and has no children.  However, he says that he has multiple siblings who provide him with good social support.  I note ACP documents are on file.  Patient will benefit from future conversation regarding CODE STATUS and completion of a MOST form if he is interested.  PLAN: -Continue current scope of treatment -ACP on file -Plan future conversation regarding MOST form -Referral to RD and ST -RTC in 3 to 4 weeks   Patient expressed understanding and was in agreement with this  plan. He also understands that He can call the clinic at any time with any questions, concerns, or complaints.     Time Total: 30 minutes  Visit consisted of counseling and education dealing with the complex and emotionally intense issues of symptom management and palliative care in the setting of serious and potentially life-threatening illness.Greater than 50%  of this time was spent counseling and coordinating care related to the above assessment and plan.  Signed by: Altha Harm, PhD, NP-C

## 2020-08-17 NOTE — Addendum Note (Signed)
Addended by: Earlie Server on: 08/17/2020 11:37 AM   Modules accepted: Orders

## 2020-08-17 NOTE — Progress Notes (Signed)
HR 106, ok to proceed per MD 

## 2020-08-17 NOTE — Progress Notes (Signed)
Hematology/Oncology  Follow Up note [facility] Telephone:([phone] Fax:([phone]   Patient Care Team: Albina Billet, MD as PCP - General (Internal Medicine) Albina Billet, MD (Internal Medicine) Bary Castilla, Forest Gleason, MD (General Surgery) Noreene Filbert, MD as Referring Physician (Radiation Oncology) Earlie Server, MD as Consulting Physician (Oncology)  CHIEF COMPLAINTS/PURPOSE OF CONSULTATION:  Follow up for head and neck cancer.  HISTORY OF PRESENTING ILLNESS:  Ivan Garcia. is a  64 y.o.  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 P16 positive. cT2 cN2 disease, stage II, # Concurrent ChemoRT  Cisplatin 100 mg/m2 q3weeks with concurrent RT which started on 12/31/2017.  S/p one dose of Cisplatin. Cisplatin discontinued due to nephrotoxicity. Switch to weekly Carboplatin (AUC 2) / Taxol 35m/m2 [ finished May 2019] Patient has finished concurrent chemoradiation in May 2019  # He reports doing well until in July 2020, he started to have hoarseness after singing for 2 to 3 hours. He has changed his insurance and Dr. MTami Ribasis no longer covered by his current insurance.  So he establish care with Dr.Megan Presti KJuliann Pulseat [place]or[place]and was seen on 08/04/2019. Flexible laryngoscopy showed Vocal cord paralysis.  Patient was recommended to have a PET scan done for restaging. Patient's primary care provider Dr. THall Busingordered a PET scan for patient which was done on 08/20/2019. 08/20/2019 PET scan showed asymmetric hypermetabolic him involving the left vocal cords with hypermetabolic adenopathy in the neck, and the chest.  He also has hypermetabolic pulmonary nodule along the minor fissure which is felt to be metastatic. Tiny proximal left ureteral stone without hydronephrosis.  Possible gallbladder sludge.  Enlarged prostate.  Atherosclerosis.  #   ultrasound-guided right supraclavicular lymph node biopsy Pathology was positive for metastatic squamous cell carcinoma.  # NGS showed PD-L1 CPS 20, TMB indeterminant, MSI stable,PIK3CA mutation Started on chemotherapy with 6 cycles of carboplatin, 5-FU and Keytruda, finished in February 2021.  And has been on Keytruda maintenance. Initially he had a good response, however progressed whiled on Keytruda maintenance.  PET 04/26/2020 worsening of disease in the chest.  Interval enlargement of the right thoracic inlet and paratracheal lymph nodes that were previously enlarged with new activity in the right hilum corresponding to a lymph node in this location.  Also increased activity in the right upper lobe nodule. Right level 3 lymph node in the neck and anterior mediastinal nodal activity is diminished.  These areas are quite small on prior study. Diminished symmetric uptake within the left as compared to the right vocal cord. Right mandible increased activity in the area of third molar.  This is consistent with his recent tooth infection.  # [facility].  Patient was offered for clinical trial with pembrolizumab/lenvatinib versus standard therapy. Patient received additional 1 cycle of Keytruda while waiting to be enrolled to the trial.   #Disease progression.  [facility] right upper paratracheal node is concerning for metastatic disease and a new from 01/20/2020 PET/CT.  The node closely abuts and possibly invade the posterolateral right tracheal wall as well as the esophagus.  Right vocal cord paralysis.  Symmetric soft tissue density at the right base of tongue could reflect residual/recurrent primary malignancy versus posttreatment changes.  Right supraclavicular lymph node is no longer appreciated with post treatment changes.  There is increased  size of right minor fissure nodule which was metabolically avid on previous PET scan.  New mediastinal and hilar lymphadenopathy concerning for new nodal disease.  No evidence of metastatic disease below the diaphragm.  Nonobstructing 3 mm distal ureter stone.  07/07/2020 started on palliative chemotherapy with docetaxel  INTERVAL HISTORY Ivan Garcia Ivan Garcia. is a 64 y.o. male who presents for follow-up for metastatic squamous cell carcinoma, P 16+.   Patient has received 2 cycles of docetaxel  3 weeks ago with G-CSF support. He tolerates well with some mild difficulties.  He reports feeling tired and fatigued.  Some nausea which was relieved by taking antiemetics. Continues to have dysphagia symptoms.  He is able to swallow soft solid food.  He has difficulty eating dry solid food such as chicken breast, steak. Denies any fever, chills, sore throat or diarrhea.  Review of Systems  Constitutional: Negative for chills, fever, malaise/fatigue and weight loss.  HENT: Negative for sore throat.   Eyes: Negative for redness.  Respiratory: Negative for cough, shortness of breath and wheezing.   Cardiovascular: Negative for chest pain, palpitations and leg swelling.  Gastrointestinal: Negative for abdominal pain, blood in stool, nausea and vomiting.       Some swallowing difficulty with solid food.  No difficulty with liquid or soft food.  Genitourinary: Negative for dysuria.  Musculoskeletal: Negative for myalgias.  Skin: Negative for rash.  Neurological: Negative for dizziness, tingling and tremors.  Endo/Heme/Allergies: Does not bruise/bleed easily.  Psychiatric/Behavioral: Negative for hallucinations.    MEDICAL HISTORY:  Past Medical History:  Diagnosis Date  . Arthritis   . Cancer ([place])    Head and neck cancer  . Hemorrhoids   . Hyperlipidemia   . Hypertension     SURGICAL HISTORY: Past Surgical History:  Procedure Laterality Date  . COLONOSCOPY WITH PROPOFOL N/A 04/04/2017    Procedure: COLONOSCOPY WITH PROPOFOL;  Surgeon: Robert Bellow, MD;  [facility] ENDOSCOPY;  Service: Endoscopy;  Laterality: N/A;  . MANDIBLE SURGERY    . PORT-A-CATH REMOVAL  07/17/2018  . PORTA CATH INSERTION N/A 12/19/2017   Procedure: PORTA CATH INSERTION;  Surgeon: Algernon Huxley, MD;  Location: [facility] CV LAB;  Service: Cardiovascular;  Laterality: N/A;  . PORTA CATH INSERTION N/A 07/17/2018   Procedure: PORTA CATH INSERTION;  Surgeon: Algernon Huxley, MD;  Location: [place] CV LAB;  Service: Cardiovascular;  Laterality: N/A;  . PORTA CATH INSERTION N/A 09/18/2019   Procedure: PORTA CATH INSERTION;  Surgeon: Algernon Huxley, MD;  Location: [place] CV LAB;  Service: Cardiovascular;  Laterality: N/A;  . TONSILLECTOMY      SOCIAL HISTORY: Social History   Socioeconomic History  . Marital status: Single    Spouse name: Not on file  . Number of children: Not on file  . Years of education: Not on file  . Highest education level: Not on file  Occupational History  . Not on file  Tobacco Use  . Smoking status: Never Smoker  . Smokeless tobacco: Never Used  Vaping Use  . Vaping Use: Never used  Substance and Sexual Activity  . Alcohol use: No  . Drug use: No  . Sexual activity: Not on file  Other Topics Concern  . Not on file  Social History Narrative  . Not on file   Social Determinants of Health   Financial Resource Strain:   .  Difficulty of Paying Living Expenses: Not on file  Food Insecurity:   . Worried About Charity fundraiser in the Last Year: Not on file  . Ran Out of Food in the Last Year: Not on file  Transportation Needs:   . Lack of Transportation (Medical): Not on file  . Lack of Transportation (Non-Medical): Not on file  Physical Activity:   . Days of Exercise per Week: Not on file  . Minutes of Exercise per Session: Not on file  Stress:   . Feeling of Stress : Not on file  Social Connections:   . Frequency of Communication with Friends  and Family: Not on file  . Frequency of Social Gatherings with Friends and Family: Not on file  . Attends Religious Services: Not on file  . Active Member of Clubs or Organizations: Not on file  . Attends Archivist Meetings: Not on file  . Marital Status: Not on file  Intimate Partner Violence:   . Fear of Current or Ex-Partner: Not on file  . Emotionally Abused: Not on file  . Physically Abused: Not on file  . Sexually Abused: Not on file    FAMILY HISTORY: Family History  Problem Relation Age of Onset  . Breast cancer Mother   . Asthma Mother   . Congestive Heart Failure Mother   . Prostate cancer Father   . Brain cancer Father   . Bladder Cancer Father     ALLERGIES:  has No Known Allergies.  MEDICATIONS:  Current Outpatient Medications  Medication Sig Dispense Refill  . amLODipine (NORVASC) 5 MG tablet Take 5 mg by mouth daily.    . DULoxetine (CYMBALTA) 30 MG capsule TAKE 1 CAPSULE BY MOUTH EVERY DAY 30 capsule 0  . lidocaine (XYLOCAINE) 2 % solution  (Patient not taking: Reported on 07/06/2020)    . lidocaine-prilocaine (EMLA) cream Apply to affected area once (Patient not taking: Reported on 07/14/2020) 30 g 3  . Multiple Vitamins-Minerals (MULTIVITAMIN WITH MINERALS) tablet Take 1 tablet by mouth daily.    Marland Kitchen nystatin (MYCOSTATIN) 100000 UNIT/ML suspension Take 5 mLs (500,000 Units total) by mouth 4 (four) times daily. (Patient not taking: Reported on 07/14/2020) 473 mL 0  . omeprazole (PRILOSEC) 20 MG capsule TAKE 1 CAPSULE BY MOUTH EVERY DAY (Patient taking differently: Take 20 mg by mouth daily as needed. ) 90 capsule 1  . ondansetron (ZOFRAN) 8 MG tablet Take 1 tablet (8 mg total) by mouth 2 (two) times daily as needed for refractory nausea / vomiting. Start on day 3 after carboplatin chemo. 30 tablet 1  . oxyCODONE (ROXICODONE) 5 MG immediate release tablet Take 1 tablet (5 mg total) by mouth every 12 (twelve) hours as needed for moderate pain, severe pain or  breakthrough pain. 30 tablet 0  . prochlorperazine (COMPAZINE) 10 MG tablet Take 1 tablet (10 mg total) by mouth every 6 (six) hours as needed (Nausea or vomiting). (Patient not taking: Reported on 07/27/2020) 30 tablet 1  . rosuvastatin (CRESTOR) 10 MG tablet Take 10 mg by mouth every evening.  4   No current facility-administered medications for this visit.   Facility-Administered Medications Ordered in Other Visits  Medication Dose Route Frequency Provider Last Rate Last Admin  . heparin lock flush 100 unit/mL  500 Units Intravenous Once Earlie Server, MD      . sodium chloride flush (NS) 0.9 % injection 10 mL  10 mL Intravenous PRN Earlie Server, MD      .  sodium chloride flush (NS) 0.9 % injection 10 mL  10 mL Intravenous Once Earlie Server, MD         PHYSICAL EXAMINATION: ECOG PERFORMANCE STATUS: 1 - Symptomatic but completely ambulatory Vitals:   08/17/20 0833 08/17/20 0855  BP: 132/88   Pulse:  (!) 106  Resp: 16   Temp: 97.8 F (36.6 C)    Physical Exam Constitutional:      General: He is not in acute distress.    Appearance: He is not diaphoretic.  HENT:     Head: Normocephalic and atraumatic.     Nose: Nose normal.     Mouth/Throat:     Pharynx: No oropharyngeal exudate.  Eyes:     General: No scleral icterus.    Pupils: Pupils are equal, round, and reactive to light.  Cardiovascular:     Rate and Rhythm: Normal rate and regular rhythm.     Heart sounds: No murmur heard.   Pulmonary:     Effort: Pulmonary effort is normal. No respiratory distress.     Breath sounds: No rales.  Chest:     Chest wall: No tenderness.  Abdominal:     General: There is no distension.     Palpations: Abdomen is soft.     Tenderness: There is no abdominal tenderness.  Musculoskeletal:        General: Normal range of motion.     Cervical back: Normal range of motion and neck supple.  Skin:    General: Skin is warm and dry.     Findings: No erythema.  Neurological:     Mental Status: He is  alert and oriented to person, place, and time.     Cranial Nerves: No cranial nerve deficit.     Motor: No abnormal muscle tone.     Coordination: Coordination normal.  Psychiatric:        Mood and Affect: Affect normal.      LABORATORY DATA:  I have reviewed the data as listed Lab Results  Component Value Date   WBC 7.9 07/27/2020   HGB 13.0 07/27/2020   HCT 38.6 (L) 07/27/2020   MCV 87.7 07/27/2020   PLT 389 07/27/2020   Recent Labs    07/06/20 0813 07/14/20 1022 07/27/20 0814  NA 138 138 137  K 4.1 4.0 3.8  CL 104 102 103  CO2 _0 GLUCOSE 97 124* 101*  BUN 26* 21 20  CREATININE 1.40* 1.50* 1.34*  CALCIUM 9.2 9.1 9.0  GFRNONAA 52* 48* 55*  GFRAA >60 56* >60  PROT 7.2 7.0 7.2  ALBUMIN 3.8 3.6 3.5  AST _1 ALT _2 ALKPHOS 83 109 86  BILITOT 0.9 0.5 0.7  RADIOGRAPHIC STUDIES: I have personally reviewed the radiological images as listed and agreed with the findings in the report. No results found.   ASSESSMENT & PLAN:   1. 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[place])   2. 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 Stage 3a chronic kidney disease   4. Encounter for antineoplastic chemotherapy   5. Loss of weight   6. 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 Labs are reviewed and discussed with patient.  Counts acceptable.  Proceed with cycle 3 docetaxel.  He will have day 3 G-CSF support. Follow-up with palliative care. Patient will get [facility] at [facility] for further assessment of treatment response.  I communicate with [facility] Barrington Ellison at [facility].  #Chemotherapy-induced neuropathy Continue Cymbalta 30 mg daily. Patient also has oxycodone 5 mg he can use every 12 hours as needed for generalized pain which usually happens a few days after each chemotherapy..  Patient request refill.  #Stage III CKD, kidney functions are stable.   Avoid nephrotoxins.  Encourage oral hydration.  Patient will receive 1 L of IV fluid today for slightly elevated creatinine than his baseline and tachycardia. #Dysphagia, weight loss, patient will need to reestablish care with nutritionist.  Recommend nutrition supplementation.  The patient knows to call the clinic with any problems questions or concerns. Follow-up 3 weeks Earlie Server, MD, PhD Hematology Oncology [place] [facility] 08/17/2020

## 2020-08-17 NOTE — Progress Notes (Signed)
Patient has trouble with food getting stuck in his esphogous.  Having more nausea with this treatment regimen as well as feeing more weak after treatment.  No appetite with 4 lb wt loss.

## 2020-08-19 ENCOUNTER — Inpatient Hospital Stay: Payer: Medicare Other

## 2020-08-19 ENCOUNTER — Other Ambulatory Visit: Payer: Self-pay

## 2020-08-19 DIAGNOSIS — C109 Malignant neoplasm of oropharynx, unspecified: Secondary | ICD-10-CM

## 2020-08-19 DIAGNOSIS — Z5112 Encounter for antineoplastic immunotherapy: Secondary | ICD-10-CM | POA: Diagnosis not present

## 2020-08-19 MED ORDER — PEGFILGRASTIM-JMDB 6 MG/0.6ML ~~LOC~~ SOSY
6.0000 mg | PREFILLED_SYRINGE | Freq: Once | SUBCUTANEOUS | Status: AC
  Administered 2020-08-19: 6 mg via SUBCUTANEOUS
  Filled 2020-08-19: qty 0.6

## 2020-08-23 ENCOUNTER — Inpatient Hospital Stay (HOSPITAL_BASED_OUTPATIENT_CLINIC_OR_DEPARTMENT_OTHER): Payer: Medicare Other | Admitting: Oncology

## 2020-08-23 ENCOUNTER — Encounter: Payer: Self-pay | Admitting: Oncology

## 2020-08-23 ENCOUNTER — Telehealth: Payer: Self-pay | Admitting: *Deleted

## 2020-08-23 ENCOUNTER — Inpatient Hospital Stay: Payer: Medicare Other

## 2020-08-23 ENCOUNTER — Other Ambulatory Visit: Payer: Self-pay

## 2020-08-23 VITALS — BP 114/78 | HR 110 | Temp 98.2°F | Resp 18

## 2020-08-23 DIAGNOSIS — C109 Malignant neoplasm of oropharynx, unspecified: Secondary | ICD-10-CM

## 2020-08-23 DIAGNOSIS — Z5112 Encounter for antineoplastic immunotherapy: Secondary | ICD-10-CM | POA: Diagnosis not present

## 2020-08-23 DIAGNOSIS — B370 Candidal stomatitis: Secondary | ICD-10-CM | POA: Diagnosis not present

## 2020-08-23 LAB — CBC WITH DIFFERENTIAL/PLATELET
Abs Immature Granulocytes: 0.11 10*3/uL — ABNORMAL HIGH (ref 0.00–0.07)
Basophils Absolute: 0.1 10*3/uL (ref 0.0–0.1)
Basophils Relative: 1 %
Eosinophils Absolute: 0.2 10*3/uL (ref 0.0–0.5)
Eosinophils Relative: 3 %
HCT: 33.7 % — ABNORMAL LOW (ref 39.0–52.0)
Hemoglobin: 11.6 g/dL — ABNORMAL LOW (ref 13.0–17.0)
Immature Granulocytes: 1 %
Lymphocytes Relative: 7 %
Lymphs Abs: 0.6 10*3/uL — ABNORMAL LOW (ref 0.7–4.0)
MCH: 30.2 pg (ref 26.0–34.0)
MCHC: 34.4 g/dL (ref 30.0–36.0)
MCV: 87.8 fL (ref 80.0–100.0)
Monocytes Absolute: 1.1 10*3/uL — ABNORMAL HIGH (ref 0.1–1.0)
Monocytes Relative: 14 %
Neutro Abs: 6 10*3/uL (ref 1.7–7.7)
Neutrophils Relative %: 74 %
Platelets: 264 10*3/uL (ref 150–400)
RBC: 3.84 MIL/uL — ABNORMAL LOW (ref 4.22–5.81)
RDW: 14.7 % (ref 11.5–15.5)
Smear Review: NORMAL
WBC: 8.1 10*3/uL (ref 4.0–10.5)
nRBC: 0.2 % (ref 0.0–0.2)

## 2020-08-23 LAB — COMPREHENSIVE METABOLIC PANEL
ALT: 16 U/L (ref 0–44)
AST: 18 U/L (ref 15–41)
Albumin: 3.5 g/dL (ref 3.5–5.0)
Alkaline Phosphatase: 93 U/L (ref 38–126)
Anion gap: 11 (ref 5–15)
BUN: 21 mg/dL (ref 8–23)
CO2: 26 mmol/L (ref 22–32)
Calcium: 8.9 mg/dL (ref 8.9–10.3)
Chloride: 100 mmol/L (ref 98–111)
Creatinine, Ser: 1.5 mg/dL — ABNORMAL HIGH (ref 0.61–1.24)
GFR calc Af Amer: 56 mL/min — ABNORMAL LOW (ref >60)
GFR calc non Af Amer: 48 mL/min — ABNORMAL LOW (ref >60)
Glucose, Bld: 129 mg/dL — ABNORMAL HIGH (ref 70–99)
Potassium: 3.6 mmol/L (ref 3.5–5.1)
Sodium: 137 mmol/L (ref 135–145)
Total Bilirubin: 0.8 mg/dL (ref 0.3–1.2)
Total Protein: 7 g/dL (ref 6.5–8.1)

## 2020-08-23 MED ORDER — HEPARIN SOD (PORK) LOCK FLUSH 100 UNIT/ML IV SOLN
500.0000 [IU] | Freq: Once | INTRAVENOUS | Status: DC
  Filled 2020-08-23: qty 5

## 2020-08-23 MED ORDER — FLUCONAZOLE 100 MG PO TABS: 100.0000 mg | ORAL_TABLET | Freq: Every day | ORAL | 0 refills | Status: DC

## 2020-08-23 MED ORDER — SODIUM CHLORIDE 0.9 % IV SOLN
Freq: Once | INTRAVENOUS | Status: AC
  Filled 2020-08-23: qty 250

## 2020-08-23 MED ORDER — SODIUM CHLORIDE 0.9% FLUSH
10.0000 mL | INTRAVENOUS | Status: DC | PRN
  Administered 2020-08-23: 10 mL via INTRAVENOUS
  Filled 2020-08-23: qty 10

## 2020-08-23 MED ORDER — FLUCONAZOLE IN SODIUM CHLORIDE 400-0.9 MG/200ML-% IV SOLN
400.0000 mg | Freq: Once | INTRAVENOUS | Status: AC
  Administered 2020-08-23: 400 mg via INTRAVENOUS
  Filled 2020-08-23: qty 200

## 2020-08-23 MED ORDER — HEPARIN SOD (PORK) LOCK FLUSH 100 UNIT/ML IV SOLN
INTRAVENOUS | Status: AC
  Filled 2020-08-23: qty 5

## 2020-08-23 MED ORDER — HEPARIN SOD (PORK) LOCK FLUSH 100 UNIT/ML IV SOLN
500.0000 [IU] | Freq: Once | INTRAVENOUS | Status: AC
  Administered 2020-08-23: 500 [IU]
  Filled 2020-08-23: qty 5

## 2020-08-23 NOTE — Telephone Encounter (Signed)
He needs to be evaluated. Please add him to my schedule this afternoon Lab md +/- IVF and diflucan. Thanks.

## 2020-08-23 NOTE — Telephone Encounter (Signed)
Done Pt has been added on for today as requested Pt is aware and is on his way

## 2020-08-23 NOTE — Progress Notes (Signed)
Hematology/Oncology  Follow Up note [facility] Telephone:([phone] Fax:([phone]   Patient Care Team: Albina Billet, MD as PCP - General (Internal Medicine) Albina Billet, MD (Internal Medicine) Bary Castilla, Forest Gleason, MD (General Surgery) Noreene Filbert, MD as Referring Physician (Radiation Oncology) Earlie Server, MD as Consulting Physician (Oncology)  CHIEF COMPLAINTS/PURPOSE OF CONSULTATION:  Follow up for head and neck cancer.  HISTORY OF PRESENTING ILLNESS:  Ivan Garcia. is a  64 y.o.  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 P16 positive. cT2 cN2 disease, stage II, # Concurrent ChemoRT  Cisplatin 100 mg/m2 q3weeks with concurrent RT which started on 12/31/2017.  S/p one dose of Cisplatin. Cisplatin discontinued due to nephrotoxicity. Switch to weekly Carboplatin (AUC 2) / Taxol 35m/m2 [ finished May 2019] Patient has finished concurrent chemoradiation in May 2019  # He reports doing well until in July 2020, he started to have hoarseness after singing for 2 to 3 hours. He has changed his insurance and Dr. MTami Ribasis no longer covered by his current insurance.  So he establish care with Dr.Skylor Schnapp [place] [place]. Flexible laryngoscopy showed Vocal cord paralysis.  Patient was recommended to have a PET scan done for restaging. Patient's primary care provider Dr. THall Busingordered a PET scan for patient which was done on 08/20/2019. 08/20/2019 PET scan showed asymmetric hypermetabolic him involving the left vocal cords with hypermetabolic adenopathy in the neck, and the chest.  He also has hypermetabolic pulmonary nodule along the minor fissure which is felt to be metastatic. Tiny proximal left ureteral stone without hydronephrosis.  Possible gallbladder sludge.  Enlarged prostate.  Atherosclerosis.  #   ultrasound-guided right supraclavicular lymph node biopsy Pathology was positive for metastatic squamous cell carcinoma.  # NGS showed PD-L1 CPS 20, TMB indeterminant, MSI stable,PIK3CA mutation Started on chemotherapy with 6 cycles of carboplatin, 5-FU and Keytruda, finished in February 2021.  And has been on Keytruda maintenance. Initially he had a good response, however progressed whiled on Keytruda maintenance.  PET 04/26/2020 worsening of disease in the chest.  Interval enlargement of the right thoracic inlet and paratracheal lymph nodes that were previously enlarged with new activity in the right hilum corresponding to a lymph node in this location.  Also increased activity in the right upper lobe nodule. Right level 3 lymph node in the neck and anterior mediastinal nodal activity is diminished.  These areas are quite small on prior study. Diminished symmetric uptake within the left as compared to the right vocal cord. Right mandible increased activity in the area of third molar.  This is consistent with his recent tooth infection.  # [facility].  Patient was offered for clinical trial with pembrolizumab/lenvatinib versus standard therapy. Patient received additional 1 cycle of Keytruda while waiting to be enrolled to the trial.   #Disease progression.  [facility] right upper paratracheal node is concerning for metastatic disease and a new from 01/20/2020 PET/CT.  The node closely abuts and possibly invade the posterolateral right tracheal wall as well as the esophagus.  Right vocal cord paralysis.  Symmetric soft tissue density at the right base of tongue could reflect residual/recurrent primary malignancy versus posttreatment changes.  Right supraclavicular lymph node is no longer appreciated with post treatment changes.  There is increased  size of right minor fissure nodule which was metabolically avid on previous PET scan.  New mediastinal and hilar lymphadenopathy concerning for new nodal disease.  No evidence of metastatic disease below the diaphragm.  Nonobstructing 3 mm distal ureter stone.  07/07/2020 started on palliative chemotherapy with docetaxel  INTERVAL HISTORY Ivan Garcia. is a 64 y.o. male who presents for follow-up for metastatic squamous cell carcinoma, P 16+.   Patient has 3 cycles of docetaxel  with G-CSF support.  Patient called today reports worsening of oral thrush and also feels weak. Patient reports that he was recommended to do oral rinse which he forgot to use and then he discovered significant worsening of oral thrush.  He also feels weak and fatigued. No fever chills, nausea vomiting, diarrhea.  He feels that dysphagia has slightly improved  Review of Systems  Constitutional: Negative for chills, fever, malaise/fatigue and weight loss.  HENT: Negative for sore throat.        Thrush  Eyes: Negative for redness.  Respiratory: Negative for cough, shortness of breath and wheezing.   Cardiovascular: Negative for chest pain, palpitations and leg swelling.  Gastrointestinal: Negative for abdominal pain, blood in stool, nausea and vomiting.       Improved dysphagia  Genitourinary: Negative for dysuria.  Musculoskeletal: Negative for myalgias.  Skin: Negative for rash.  Neurological: Negative for dizziness, tingling and tremors.  Endo/Heme/Allergies: Does not bruise/bleed easily.  Psychiatric/Behavioral: Negative for hallucinations.    MEDICAL HISTORY:  Past Medical History:  Diagnosis Date  . Arthritis   . Cancer ([place])    Head and neck cancer  . Hemorrhoids   . Hyperlipidemia   . Hypertension     SURGICAL HISTORY: Past Surgical History:  Procedure Laterality Date  . COLONOSCOPY WITH PROPOFOL N/A 04/04/2017   Procedure: COLONOSCOPY WITH PROPOFOL;  Surgeon: Robert Bellow, MD;   Location[facility] ENDOSCOPY;  Service: Endoscopy;  Laterality: N/A;  . MANDIBLE SURGERY    . PORT-A-CATH REMOVAL  07/17/2018  . PORTA CATH INSERTION N/A 12/19/2017   Procedure: PORTA CATH INSERTION;  Surgeon: Algernon Huxley, MD;  Location: [place] CV LAB;  Service: Cardiovascular;  Laterality: N/A;  . PORTA CATH INSERTION N/A 07/17/2018   Procedure: PORTA CATH INSERTION;  Surgeon: Algernon Huxley, MD;  Location: [place] CV LAB;  Service: Cardiovascular;  Laterality: N/A;  . PORTA CATH INSERTION N/A 09/18/2019   Procedure: PORTA CATH INSERTION;  Surgeon: Algernon Huxley, MD;  Location: [place] CV LAB;  Service: Cardiovascular;  Laterality: N/A;  . TONSILLECTOMY      SOCIAL HISTORY: Social History   Socioeconomic History  . Marital status: Single    Spouse name: Not on file  . Number of children: Not on file  . Years of education: Not on file  . Highest education level: Not on file  Occupational History  . Not on file  Tobacco Use  . Smoking status: Never Smoker  . Smokeless tobacco: Never Used  Vaping Use  . Vaping Use: Never used  Substance and Sexual Activity  . Alcohol use: No  . Drug use: No  . Sexual activity: Not on file  Other Topics Concern  . Not on file  Social History Narrative  . Not on file   Social Determinants of Health   Financial Resource Strain:   . Difficulty of Paying Living Expenses: Not on file  Food Insecurity:   . Worried About Charity fundraiser in the Last Year: Not on file  . Ran Out of Food in the Last Year: Not on file  Transportation Needs:   . Lack of Transportation (Medical): Not on file  . Lack of Transportation (Non-Medical): Not on file  Physical Activity:   . Days of Exercise per Week: Not on file  . Minutes of Exercise per Session: Not on file  Stress:   . Feeling of Stress : Not on file  Social Connections:   . Frequency of Communication with Friends and Family: Not on file  . Frequency of Social Gatherings with Friends  and Family: Not on file  . Attends Religious Services: Not on file  . Active Member of Clubs or Organizations: Not on file  . Attends Archivist Meetings: Not on file  . Marital Status: Not on file  Intimate Partner Violence:   . Fear of Current or Ex-Partner: Not on file  . Emotionally Abused: Not on file  . Physically Abused: Not on file  . Sexually Abused: Not on file    FAMILY HISTORY: Family History  Problem Relation Age of Onset  . Breast cancer Mother   . Asthma Mother   . Congestive Heart Failure Mother   . Prostate cancer Father   . Brain cancer Father   . Bladder Cancer Father     ALLERGIES:  has No Known Allergies.  MEDICATIONS:  Current Outpatient Medications  Medication Sig Dispense Refill  . amLODipine (NORVASC) 5 MG tablet Take 5 mg by mouth daily.    Marland Kitchen dexamethasone (DECADRON) 1 MG tablet Take 1 mg by mouth 2 (two) times daily with a meal.    . Multiple Vitamins-Minerals (MULTIVITAMIN WITH MINERALS) tablet Take 1 tablet by mouth daily.    Marland Kitchen nystatin (MYCOSTATIN) 100000 UNIT/ML suspension Take 5 mLs (500,000 Units total) by mouth in the morning, at noon, and at bedtime. SWISH AND SPIT 473 mL 0  . omeprazole (PRILOSEC) 20 MG capsule TAKE 1 CAPSULE BY MOUTH EVERY DAY (Patient taking differently: Take 20 mg by mouth daily as needed. ) 90 capsule 1  . ondansetron (ZOFRAN) 8 MG tablet Take 1 tablet (8 mg total) by mouth 2 (two) times daily as needed for refractory nausea / vomiting. Start on day 3 after carboplatin chemo. 30 tablet 1  . oxyCODONE (ROXICODONE) 5 MG immediate release tablet Take 1 tablet (5 mg total) by mouth every 12 (twelve) hours as needed for moderate pain, severe pain or breakthrough pain. 30 tablet 0  . rosuvastatin (CRESTOR) 10 MG tablet Take 10 mg by mouth every evening.  4  . DULoxetine (CYMBALTA) 30 MG capsule TAKE 1 CAPSULE BY MOUTH EVERY DAY (Patient not taking: Reported on 08/23/2020) 30 capsule 0  . fluconazole (DIFLUCAN) 100 MG  tablet Take 1 tablet (100 mg total) by mouth daily. 10 tablet 0  . lidocaine (XYLOCAINE) 2 % solution  (Patient not taking: Reported on 07/06/2020)    . lidocaine-prilocaine (EMLA) cream Apply to affected area once (Patient not taking: Reported on 07/14/2020) 30 g 3  . prochlorperazine (COMPAZINE) 10 MG tablet Take 1 tablet (10 mg total) by mouth every 6 (six) hours as needed (Nausea or vomiting). (Patient not taking: Reported on 07/27/2020) 30 tablet 1   No current facility-administered medications for this visit.   Facility-Administered Medications Ordered in Other Visits  Medication Dose Route Frequency Provider Last Rate Last Admin  . sodium chloride flush (NS)  0.9 % injection 10 mL  10 mL Intravenous PRN Earlie Server, MD         PHYSICAL EXAMINATION: ECOG PERFORMANCE STATUS: 1 - Symptomatic but completely ambulatory Vitals:   08/23/20 1440  BP: 114/78  Pulse: (!) 110  Resp: 18  Temp: 98.2 F (36.8 C)  SpO2: 99%   Physical Exam Constitutional:      General: He is not in acute distress.    Appearance: He is not diaphoretic.  HENT:     Head: Normocephalic and atraumatic.     Nose: Nose normal.     Mouth/Throat:     Pharynx: No oropharyngeal exudate.     Comments: Thrush Eyes:     General: No scleral icterus.    Pupils: Pupils are equal, round, and reactive to light.  Cardiovascular:     Rate and Rhythm: Normal rate and regular rhythm.     Heart sounds: No murmur heard.   Pulmonary:     Effort: Pulmonary effort is normal. No respiratory distress.     Breath sounds: No rales.  Chest:     Chest wall: No tenderness.  Abdominal:     General: There is no distension.     Palpations: Abdomen is soft.     Tenderness: There is no abdominal tenderness.  Musculoskeletal:        General: Normal range of motion.     Cervical back: Normal range of motion and neck supple.  Skin:    General: Skin is warm and dry.     Findings: No erythema.  Neurological:     Mental Status: He is  alert and oriented to person, place, and time.     Cranial Nerves: No cranial nerve deficit.     Motor: No abnormal muscle tone.     Coordination: Coordination normal.  Psychiatric:        Mood and Affect: Affect normal.      LABORATORY DATA:  I have reviewed the data as listed Lab Results  Component Value Date   WBC 8.1 08/23/2020   HGB 11.6 (L) 08/23/2020   HCT 33.7 (L) 08/23/2020   MCV 87.8 08/23/2020   PLT 264 08/23/2020   Recent Labs    07/27/20 0814 08/17/20 0804 08/23/20 1414  NA 137 139 137  K 3.8 4.0 3.6  CL 103 105 100  CO2 _0 GLUCOSE 101* 101* 129*  BUN _1 CREATININE 1.34* 1.52* 1.50*  CALCIUM 9.0 9.2 8.9  GFRNONAA 55* 47* 48*  GFRAA >60 55* 56*  PROT 7.2 7.4 7.0  ALBUMIN 3.5 3.5 3.5  AST _2 ALT _3 ALKPHOS 86 82 93  BILITOT 0.7 0.7 0.8  RADIOGRAPHIC STUDIES: I have personally reviewed the radiological images as listed and agreed with the findings in the report. No results found.   ASSESSMENT & PLAN:   1. 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[place])   2. Thrush    #Metastatic squamous cell carcinoma of oropharynx- cervical lymphadenopathy, lung metastatic disease Status post 3 cycles docetaxel with G-CSF support. Labs are reviewed and discussed with patient. No neutropenia.  Stable counts. I have communicated with Dr. Barrington Ellison at [facility] and she plans to proceed with restage CT at [facility] at the end of September.   #Oral thrush Patient's immunocompromise. Recommend to proceed with loading dose of IV Diflucan 400 mg x 1 today. I sent him a prescription of Diflucan 100 mg for total 10 days course.  Patient will also receive IV fluid normal saline 1 L today. Advised patient to call the clinic if symptoms do not improve. Follow-up: 1 week Earlie Server, MD, PhD Hematology Oncology [facility] 08/23/2020

## 2020-08-23 NOTE — Progress Notes (Signed)
Patient here for oncology follow-up appointment, expresses complaints of oral thrush flare, weakness, and fatigue.

## 2020-08-23 NOTE — Progress Notes (Signed)
Patient has moderate discomfort from thrush with impaired oral intake. 1 Liter of NS given. Pt receiving 400 mg diflucan over 2 hours; pt aware and agreed to stay for 2 more hours.

## 2020-08-23 NOTE — Telephone Encounter (Signed)
Patient called reporting that at his last appointment prior to chemotherapy he was told that he had thrush and was given prescription for Nystatin. He states he forgot about taking it and that now "it has exploded, my whole tongue is white and it hurts in my throat and down in my chest" as well he reports weakness and no appetite. He states he is seeing the dietician this week and has been drinking protein shakes. He is asking if there is a pill that you can give him to help with all this. Please advise

## 2020-08-26 ENCOUNTER — Other Ambulatory Visit: Payer: Self-pay | Admitting: Oncology

## 2020-08-26 ENCOUNTER — Other Ambulatory Visit: Payer: Self-pay

## 2020-08-26 ENCOUNTER — Inpatient Hospital Stay: Payer: Medicare Other

## 2020-08-26 NOTE — Progress Notes (Signed)
Nutrition Follow-up:  Last seen by RD on 10/20/2019.    Patient with recurrent metastatic squamous cell carcinoma, p 16 positive.  Patient currently receiving docetaxol.  Currently not eligible for clinical trial at [facility].    Met with patient in clinic this am.  Got mixed up on appointment time.  RD able to work patient in today.    Patient reports that this chemotherapy has really been hard on him.  Decreased appetite, some nausea, feeling weak, tired, thrush.  Reports that he continues to drink his protein shake (protein powder, fruits, milk, ice cream) usually in the am.  Can't tolerate eggs.  Will eat bacon, sausage, cheese toast.  Got strangled on chicken recently due to mass in throat and finally was able to throw it up.  Reports that he can't afford ensure/boost shakes.  Reports week of treatment appetite is poor, does take nausea medications.  Appetite has improved with treatment of thrush as well.     Medications: reviewed  Labs: reviewed  Anthropometrics:   188 lb decreased from 200 lb in 05/14/2020  6% weight loss in the last 3 months, concerning   Estimated Energy Needs  Kcals: 2100-2500 Protein: 105-125 g Fluid: 2 L  NUTRITION DIAGNOSIS: Inadequate oral intake continues   INTERVENTION:  Discussed chopping, grinding, pureeing foods for easy of swallowing.  Complimentary case of ensure enlive given to patient today. Can drink up to 6 per day if unable to eat solid foods to better meet nutritional needs Discussed ways to add calories and protein. Handout given Discussed soft foods high in protein. Handout provided.  Contact information given    MONITORING, EVALUATION, GOAL: weight trends, intake   NEXT VISIT: October 14, phone f/u  Jaquon Gingerich [name], [place], [place] Registered Dietitian [phone] (mobile)

## 2020-08-31 ENCOUNTER — Inpatient Hospital Stay: Payer: Medicare Other

## 2020-08-31 ENCOUNTER — Encounter: Payer: Self-pay | Admitting: Oncology

## 2020-08-31 ENCOUNTER — Other Ambulatory Visit: Payer: Self-pay

## 2020-08-31 ENCOUNTER — Inpatient Hospital Stay (HOSPITAL_BASED_OUTPATIENT_CLINIC_OR_DEPARTMENT_OTHER): Payer: Medicare Other | Admitting: Oncology

## 2020-08-31 VITALS — BP 115/87 | HR 120 | Temp 98.3°F | Resp 18 | Wt 184.2 lb

## 2020-08-31 DIAGNOSIS — B370 Candidal stomatitis: Secondary | ICD-10-CM

## 2020-08-31 DIAGNOSIS — R000 Tachycardia, unspecified: Secondary | ICD-10-CM | POA: Diagnosis not present

## 2020-08-31 DIAGNOSIS — R1310 Dysphagia, unspecified: Secondary | ICD-10-CM

## 2020-08-31 DIAGNOSIS — Z5112 Encounter for antineoplastic immunotherapy: Secondary | ICD-10-CM | POA: Diagnosis not present

## 2020-08-31 DIAGNOSIS — C109 Malignant neoplasm of oropharynx, unspecified: Secondary | ICD-10-CM | POA: Diagnosis not present

## 2020-08-31 DIAGNOSIS — N1831 Chronic kidney disease, stage 3a: Secondary | ICD-10-CM

## 2020-08-31 LAB — CBC WITH DIFFERENTIAL/PLATELET
Abs Immature Granulocytes: 0.09 10*3/uL — ABNORMAL HIGH (ref 0.00–0.07)
Basophils Absolute: 0.1 10*3/uL (ref 0.0–0.1)
Basophils Relative: 0 %
Eosinophils Absolute: 0 10*3/uL (ref 0.0–0.5)
Eosinophils Relative: 0 %
HCT: 37.2 % — ABNORMAL LOW (ref 39.0–52.0)
Hemoglobin: 12.6 g/dL — ABNORMAL LOW (ref 13.0–17.0)
Immature Granulocytes: 1 %
Lymphocytes Relative: 6 %
Lymphs Abs: 0.8 10*3/uL (ref 0.7–4.0)
MCH: 30 pg (ref 26.0–34.0)
MCHC: 33.9 g/dL (ref 30.0–36.0)
MCV: 88.6 fL (ref 80.0–100.0)
Monocytes Absolute: 0.7 10*3/uL (ref 0.1–1.0)
Monocytes Relative: 5 %
Neutro Abs: 12.5 10*3/uL — ABNORMAL HIGH (ref 1.7–7.7)
Neutrophils Relative %: 88 %
Platelets: 230 10*3/uL (ref 150–400)
RBC: 4.2 MIL/uL — ABNORMAL LOW (ref 4.22–5.81)
RDW: 15.2 % (ref 11.5–15.5)
WBC: 14.2 10*3/uL — ABNORMAL HIGH (ref 4.0–10.5)
nRBC: 0.2 % (ref 0.0–0.2)

## 2020-08-31 LAB — COMPREHENSIVE METABOLIC PANEL
ALT: 17 U/L (ref 0–44)
AST: 20 U/L (ref 15–41)
Albumin: 3.5 g/dL (ref 3.5–5.0)
Alkaline Phosphatase: 103 U/L (ref 38–126)
Anion gap: 12 (ref 5–15)
BUN: 25 mg/dL — ABNORMAL HIGH (ref 8–23)
CO2: 27 mmol/L (ref 22–32)
Calcium: 9.3 mg/dL (ref 8.9–10.3)
Chloride: 98 mmol/L (ref 98–111)
Creatinine, Ser: 1.64 mg/dL — ABNORMAL HIGH (ref 0.61–1.24)
GFR calc Af Amer: 50 mL/min — ABNORMAL LOW (ref >60)
GFR calc non Af Amer: 43 mL/min — ABNORMAL LOW (ref >60)
Glucose, Bld: 134 mg/dL — ABNORMAL HIGH (ref 70–99)
Potassium: 4.5 mmol/L (ref 3.5–5.1)
Sodium: 137 mmol/L (ref 135–145)
Total Bilirubin: 0.6 mg/dL (ref 0.3–1.2)
Total Protein: 7.3 g/dL (ref 6.5–8.1)

## 2020-08-31 LAB — VITAMIN B12: Vitamin B-12: 4703 pg/mL — ABNORMAL HIGH (ref 180–914)

## 2020-08-31 MED ORDER — HEPARIN SOD (PORK) LOCK FLUSH 100 UNIT/ML IV SOLN
500.0000 [IU] | Freq: Once | INTRAVENOUS | Status: AC
  Administered 2020-08-31: 500 [IU] via INTRAVENOUS
  Filled 2020-08-31: qty 5

## 2020-08-31 MED ORDER — HEPARIN SOD (PORK) LOCK FLUSH 100 UNIT/ML IV SOLN
INTRAVENOUS | Status: AC
  Filled 2020-08-31: qty 10

## 2020-08-31 MED ORDER — SODIUM CHLORIDE 0.9 % IV SOLN
Freq: Once | INTRAVENOUS | Status: AC
  Filled 2020-08-31: qty 250

## 2020-08-31 MED ORDER — SODIUM CHLORIDE 0.9 % IV SOLN
INTRAVENOUS | Status: DC
  Filled 2020-08-31 (×2): qty 250

## 2020-08-31 MED ORDER — SODIUM CHLORIDE 0.9% FLUSH
10.0000 mL | Freq: Once | INTRAVENOUS | Status: AC
  Administered 2020-08-31: 10 mL via INTRAVENOUS
  Filled 2020-08-31: qty 10

## 2020-08-31 NOTE — Therapy (Signed)
[facility] [address] [place], [place], [place], [address] Phone: [phone]   Fax:  [phone]  Speech Language Pathology Screen  Patient Details  Name: Ivan Garcia. MRN: [id number] Date of Birth: 1956-04-17 No data recorded  Encounter Date: 08/31/2020   End of Session - 08/31/20 1629    Visit Number 0           Past Medical History:  Diagnosis Date  . Arthritis   . Cancer ([place])    Head and neck cancer  . Hemorrhoids   . Hyperlipidemia   . Hypertension     Past Surgical History:  Procedure Laterality Date  . COLONOSCOPY WITH PROPOFOL N/A 04/04/2017   Procedure: COLONOSCOPY WITH PROPOFOL;  Surgeon: Robert Bellow, MD;  Location[facility] ENDOSCOPY;  Service: Endoscopy;  Laterality: N/A;  . MANDIBLE SURGERY    . PORT-A-CATH REMOVAL  07/17/2018  . PORTA CATH INSERTION N/A 12/19/2017   Procedure: PORTA CATH INSERTION;  Surgeon: Algernon Huxley, MD;  Location: [place] CV LAB;  Service: Cardiovascular;  Laterality: N/A;  . PORTA CATH INSERTION N/A 07/17/2018   Procedure: PORTA CATH INSERTION;  Surgeon: Algernon Huxley, MD;  Location: [place] CV LAB;  Service: Cardiovascular;  Laterality: N/A;  . PORTA CATH INSERTION N/A 09/18/2019   Procedure: PORTA CATH INSERTION;  Surgeon: Algernon Huxley, MD;  Location: [place] CV LAB;  Service: Cardiovascular;  Laterality: N/A;  . TONSILLECTOMY      There were no vitals filed for this visit.   Subjective Assessment - 08/31/20 1626    Subjective pt very engaging and talkative; pleasant. talked a great deal about his upcoming appt at [facility] for a [facility] scan             Assessment: Pt is a  64 y.o.  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  Initially, he reports doing well until in July 2020, he started to have hoarseness after singing for 2 to 3 hours. So, he establish care with Dr.Yu Juliann Pulse at [place] and was seen on 08/04/2019. Flexible laryngoscopy showed vocal cord paralysis. Pt was recommended to have a PET scan done for restaging. On 08/20/2019 PET scan showed asymmetric hypermetabolic him involving the left vocal cords with hypermetabolic adenopathy in the neck, and the chest.  He also has hypermetabolic pulmonary nodule along the minor fissure which is felt to be metastatic. Disease progression.  Not eligible for the clinical trial at [facility] 06/24/2020, CT neck chest abdomen pelvis with contrast at [facility] showed Centrally necrotic right upper paratracheal node is concerning for metastatic disease and a new from 01/20/2020 PET/CT.  The node closely abuts and possibly invade the posterolateral right tracheal wall as well as the esophagus.  Right vocal cord paralysis.  Symmetric soft tissue density at the right base of tongue could reflect residual/recurrent primary malignancy versus posttreatment changes.  Right supraclavicular lymph node is no longer appreciated with post treatment changes.  There is increased size of right minor fissure nodule which was metabolically avid on previous PET scan.  New mediastinal and hilar lymphadenopathy concerning for new nodal disease.  No evidence of metastatic disease below the diaphragm.  Patient has had 3 cycles of docetaxel  with G-CSF support. Pt recently exhibited dehydration and severe thrush last week; given IV fluid and IV Diflucan followed by a course of oral Diflucan. He reports that his thrush has  improved "now".  Per conversation w/ pt, he endorsed improvement of the thrush and the "phlegmy" feeling in his throat and chest was "clearing up now". Encouraged him to stay mobile/active for aerobic benefit. He endorsed a "poor choice" of eating tougher chicken feeling as if it "hung" in his throat. Discussed the challenges of meats/breads when swallowing d/t their bulky nature even w/ extended mastication/chewing. Explored food preparation such as mincing  meats and using gravies/condiments to moisten meats/breads. Encouraged soups as a staple at meals for moistening other foods. He endorsed a h/o Esophageal phase dysmotility Prior to any cancer stating "it runs in my family"(Esophageal Dilations); he described a choking event w/ steak at a cookout many years ago. Pt stated he drank supplement drinks and ate soft foods identifying several foods of choice that were "easy for me to eat".  He was talkative re: an upcoming appt at [facility] for a repeat CT scan to see if his txs have "worked". He agreed to follow suggestions from discussion above and inform MD if he felt he could benefit from any further ST services, education re: oropharyngeal swallowing.                     Dysphagia, unspecified type    Problem List Patient Active Problem List   Diagnosis Date Noted  . 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 Bone pain 07/14/2020  . Encounter for antineoplastic immunotherapy 04/27/2020  . Stage 3a chronic kidney disease 12/22/2019  . Encounter for antineoplastic chemotherapy 10/06/2019  . Neoplasm related pain 10/06/2019  . Stage 3 chronic kidney disease 03/24/2018  . Edema 03/24/2018  . Port-A-Cath in place 03/24/2018  . ARF (acute renal failure) ([place]) 01/02/2018  . 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[place]) 12/14/2017  . Goals of care, counseling/discussion 12/10/2017  . Encounter for screening colonoscopy 02/28/2017       Orinda Kenner, [place], [place] Speech Language Pathologist Rehab Services [phone] [facility] 08/31/2020, 4:32 PM  [facility] [address] [place], [place], [place], [address] Phone: [phone]   Fax:  [phone]  Name: Ivan Garcia. MRN: [id number] Date of Birth: 06/22/1956

## 2020-08-31 NOTE — Progress Notes (Signed)
Hematology/Oncology  Follow Up note [facility] Telephone:([phone] Fax:([phone]   Patient Care Team: Albina Billet, MD as PCP - General (Internal Medicine) Albina Billet, MD (Internal Medicine) Bary Castilla, Forest Gleason, MD (General Surgery) Noreene Filbert, MD as Referring Physician (Radiation Oncology) Earlie Server, MD as Consulting Physician (Oncology)  CHIEF COMPLAINTS/PURPOSE OF CONSULTATION:  Follow up for head and neck cancer.  HISTORY OF PRESENTING ILLNESS:  Ivan Garcia. is a  64 y.o.  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 P16 positive. cT2 cN2 disease, stage II, # Concurrent ChemoRT  Cisplatin 100 mg/m2 q3weeks with concurrent RT which started on 12/31/2017.  S/p one dose of Cisplatin. Cisplatin discontinued due to nephrotoxicity. Switch to weekly Carboplatin (AUC 2) / Taxol 35m/m2 [ finished May 2019] Patient has finished concurrent chemoradiation in May 2019  # He reports doing well until in July 2020, he started to have hoarseness after singing for 2 to 3 hours. He has changed his insurance and Dr. MTami Ribasis no longer covered by his current insurance.  So he establish care with Dr.Janylah Belgrave KJuliann Pulseat [place] was seen on 08/04/2019. Flexible laryngoscopy showed Vocal cord paralysis.  Patient was recommended to have a PET scan done for restaging. Patient's primary care provider Dr. THall Busingordered a PET scan for patient which was done on 08/20/2019. 08/20/2019 PET scan showed asymmetric hypermetabolic him involving the left vocal cords with hypermetabolic adenopathy in the neck, and the chest.  He also has hypermetabolic pulmonary nodule along the minor fissure which is felt to be metastatic. Tiny proximal left ureteral stone without hydronephrosis.  Possible gallbladder sludge.  Enlarged prostate.  Atherosclerosis.  #   ultrasound-guided right supraclavicular lymph node biopsy Pathology was positive for metastatic squamous cell carcinoma.  # NGS showed PD-L1 CPS 20, TMB indeterminant, MSI stable,PIK3CA mutation Started on chemotherapy with 6 cycles of carboplatin, 5-FU and Keytruda, finished in February 2021.  And has been on Keytruda maintenance. Initially he had a good response, however progressed whiled on Keytruda maintenance.  PET 04/26/2020 worsening of disease in the chest.  Interval enlargement of the right thoracic inlet and paratracheal lymph nodes that were previously enlarged with new activity in the right hilum corresponding to a lymph node in this location.  Also increased activity in the right upper lobe nodule. Right level 3 lymph node in the neck and anterior mediastinal nodal activity is diminished.  These areas are quite small on prior study. Diminished symmetric uptake within the left as compared to the right vocal cord. Right mandible increased activity in the area of third molar.  This is consistent with his recent tooth infection.  # [facility]  Patient was offered for clinical trial with pembrolizumab/lenvatinib versus standard therapy. Patient received additional 1 cycle of Keytruda while waiting to be enrolled to the trial.   #Disease progression.  [facility] right upper paratracheal node is concerning for metastatic disease and a new from 01/20/2020 PET/CT.  The node closely abuts and possibly invade the posterolateral right tracheal wall as well as the esophagus.  Right vocal cord paralysis.  Symmetric soft tissue density at the right base of tongue could reflect residual/recurrent primary malignancy versus posttreatment changes.  Right supraclavicular lymph node is no longer appreciated with post treatment changes.  There is increased  size of right minor fissure nodule which was metabolically avid on previous PET scan.  New mediastinal and hilar lymphadenopathy concerning for new nodal disease.  No evidence of metastatic disease below the diaphragm.  Nonobstructing 3 mm distal ureter stone.  07/07/2020 started on palliative chemotherapy with docetaxel  INTERVAL HISTORY Ivan Garcia. is a 64 y.o. male who presents for follow-up for metastatic squamous cell carcinoma, P 16+.   Patient has 3 cycles of docetaxel  with G-CSF support.   Patient was here last week due to dehydration and severe thrush.  Patient was given IV fluid and also IV Diflucan followed by a course of oral Diflucan.  Patient reports that his rash has improved. Dysphagia was better last time and recently he feels the symptom has come back later.. Denies any fever, chills, cough, abdominal pain. He tries to stay well-hydrated however he admits to not drinking enough fluid. Denies any palpitation, chest pain.    Review of Systems  Constitutional: Negative for chills, fever, malaise/fatigue and weight loss.  HENT: Negative for sore throat.        Ritta Slot has improved  Eyes: Negative for redness.  Respiratory: Negative for cough, shortness of breath and wheezing.   Cardiovascular: Negative for chest pain, palpitations and leg swelling.  Gastrointestinal: Negative for abdominal pain, blood in stool, nausea and vomiting.       Still has dysphagia  Genitourinary: Negative for dysuria.  Musculoskeletal: Negative for myalgias.  Skin: Negative for rash.  Neurological: Negative for dizziness, tingling and tremors.  Endo/Heme/Allergies: Does not bruise/bleed easily.  Psychiatric/Behavioral: Negative for hallucinations.    MEDICAL HISTORY:  Past Medical History:  Diagnosis Date  . Arthritis   . Cancer ([place])    Head and neck cancer  . Hemorrhoids   . Hyperlipidemia   . Hypertension     SURGICAL HISTORY: Past Surgical History:  Procedure  Laterality Date  . COLONOSCOPY WITH PROPOFOL N/A 04/04/2017   Procedure: COLONOSCOPY WITH PROPOFOL;  Surgeon: Robert Bellow, MD;  [facility] END[facility]COPY;  Service: Endoscopy;  Laterality: N/A;  . MANDIBLE SURGERY    . PORT-A-CATH REMOVAL  07/17/2018  . PORTA CATH INSERTION N/A 12/19/2017   Procedure: PORTA CATH INSERTION;  Surgeon: Algernon Huxley, MD;  Location: [place] CV LAB;  Service: Cardiovascular;  Laterality: N/A;  . PORTA CATH INSERTION N/A 07/17/2018   Procedure: PORTA CATH INSERTION;  Surgeon: Algernon Huxley, MD;  Location: [place] CV LAB;  Service: Cardiovascular;  Laterality: N/A;  . PORTA CATH INSERTION N/A 09/18/2019   Procedure: PORTA CATH INSERTION;  Surgeon: Algernon Huxley, MD;  Location: [place] CV LAB;  Service: Cardiovascular;  Laterality: N/A;  . TONSILLECTOMY      SOCIAL HISTORY: Social History   Socioeconomic History  . Marital status: Single    Spouse name: Not on file  . Number of children: Not on file  . Years of education: Not on file  . Highest education level: Not on file  Occupational History  . Not on file  Tobacco Use  . Smoking status: Never Smoker  . Smokeless tobacco: Never Used  Vaping Use  . Vaping Use: Never used  Substance and Sexual Activity  . Alcohol use: No  . Drug use: No  . Sexual activity: Not on file  Other Topics Concern  . Not on file  Social History Narrative  .  Not on file   Social Determinants of Health   Financial Resource Strain:   . Difficulty of Paying Living Expenses: Not on file  Food Insecurity:   . Worried About Charity fundraiser in the Last Year: Not on file  . Ran Out of Food in the Last Year: Not on file  Transportation Needs:   . Lack of Transportation (Medical): Not on file  . Lack of Transportation (Non-Medical): Not on file  Physical Activity:   . Days of Exercise per Week: Not on file  . Minutes of Exercise per Session: Not on file  Stress:   . Feeling of Stress : Not on file    Social Connections:   . Frequency of Communication with Friends and Family: Not on file  . Frequency of Social Gatherings with Friends and Family: Not on file  . Attends Religious Services: Not on file  . Active Member of Clubs or Organizations: Not on file  . Attends Archivist Meetings: Not on file  . Marital Status: Not on file  Intimate Partner Violence:   . Fear of Current or Ex-Partner: Not on file  . Emotionally Abused: Not on file  . Physically Abused: Not on file  . Sexually Abused: Not on file    FAMILY HISTORY: Family History  Problem Relation Age of Onset  . Breast cancer Mother   . Asthma Mother   . Congestive Heart Failure Mother   . Prostate cancer Father   . Brain cancer Father   . Bladder Cancer Father     ALLERGIES:  has No Known Allergies.  MEDICATIONS:  Current Outpatient Medications  Medication Sig Dispense Refill  . amLODipine (NORVASC) 5 MG tablet Take 5 mg by mouth daily.    Marland Kitchen dexamethasone (DECADRON) 1 MG tablet Take 1 mg by mouth 2 (two) times daily with a meal.    . DULoxetine (CYMBALTA) 30 MG capsule TAKE 1 CAPSULE BY MOUTH EVERY DAY 30 capsule 0  . fluconazole (DIFLUCAN) 100 MG tablet Take 1 tablet (100 mg total) by mouth daily. 10 tablet 0  . Multiple Vitamins-Minerals (MULTIVITAMIN WITH MINERALS) tablet Take 1 tablet by mouth daily.    Marland Kitchen omeprazole (PRILOSEC) 20 MG capsule TAKE 1 CAPSULE BY MOUTH EVERY DAY (Patient taking differently: Take 20 mg by mouth daily as needed. ) 90 capsule 1  . ondansetron (ZOFRAN) 8 MG tablet Take 1 tablet (8 mg total) by mouth 2 (two) times daily as needed for refractory nausea / vomiting. Start on day 3 after carboplatin chemo. 30 tablet 1  . oxyCODONE (ROXICODONE) 5 MG immediate release tablet Take 1 tablet (5 mg total) by mouth every 12 (twelve) hours as needed for moderate pain, severe pain or breakthrough pain. 30 tablet 0  . prochlorperazine (COMPAZINE) 10 MG tablet Take 1 tablet (10 mg total) by  mouth every 6 (six) hours as needed (Nausea or vomiting). 30 tablet 1  . rosuvastatin (CRESTOR) 10 MG tablet Take 10 mg by mouth every evening.  4  . lidocaine (XYLOCAINE) 2 % solution  (Patient not taking: Reported on 07/06/2020)    . lidocaine-prilocaine (EMLA) cream Apply to affected area once (Patient not taking: Reported on 07/14/2020) 30 g 3  . nystatin (MYCOSTATIN) 100000 UNIT/ML suspension Take 5 mLs (500,000 Units total) by mouth in the morning, at noon, and at bedtime. SWISH AND SPIT (Patient not taking: Reported on 08/31/2020) 473 mL 0   No current facility-administered medications for this visit.   Facility-Administered  Medications Ordered in Other Visits  Medication Dose Route Frequency Provider Last Rate Last Admin  . 0.9 %  sodium chloride infusion   Intravenous Continuous Earlie Server, MD   Paused at 08/31/20 1500  . sodium chloride flush (NS) 0.9 % injection 10 mL  10 mL Intravenous PRN Earlie Server, MD         PHYSICAL EXAMINATION: ECOG PERFORMANCE STATUS: 1 - Symptomatic but completely ambulatory Vitals:   08/31/20 1315  BP: 115/87  Pulse: (!) 120  Resp: 18  Temp: 98.3 F (36.8 C)  SpO2: 97%   Physical Exam Constitutional:      General: He is not in acute distress.    Appearance: He is not diaphoretic.  HENT:     Head: Normocephalic and atraumatic.     Nose: Nose normal.     Mouth/Throat:     Pharynx: No oropharyngeal exudate.     Comments: Ritta Slot has resolved Eyes:     General: No scleral icterus.    Pupils: Pupils are equal, round, and reactive to light.  Cardiovascular:     Rate and Rhythm: Regular rhythm. Tachycardia present.     Heart sounds: No murmur heard.   Pulmonary:     Effort: Pulmonary effort is normal. No respiratory distress.     Breath sounds: No rales.  Chest:     Chest wall: No tenderness.  Abdominal:     General: There is no distension.     Palpations: Abdomen is soft.     Tenderness: There is no abdominal tenderness.  Musculoskeletal:         General: Normal range of motion.     Cervical back: Normal range of motion and neck supple.  Skin:    General: Skin is warm and dry.     Findings: No erythema.  Neurological:     Mental Status: He is alert and oriented to person, place, and time.     Cranial Nerves: No cranial nerve deficit.     Motor: No abnormal muscle tone.     Coordination: Coordination normal.  Psychiatric:        Mood and Affect: Affect normal.      LABORATORY DATA:  I have reviewed the data as listed Lab Results  Component Value Date   WBC 14.2 (H) 08/31/2020   HGB 12.6 (L) 08/31/2020   HCT 37.2 (L) 08/31/2020   MCV 88.6 08/31/2020   PLT 230 08/31/2020   Recent Labs    08/17/20 0804 08/23/20 1414 08/31/20 1244  NA 139 137 137  K 4.0 3.6 4.5  CL 105 100 98  CO2 $Re'25 26 27  'yWn$ GLUCOSE 101* 129* 134*  BUN 23 21 25*  CREATININE 1.52* 1.50* 1.64*  CALCIUM 9.2 8.9 9.3  GFRNONAA 47* 48* 43*  GFRAA 55* 56* 50*  PROT 7.4 7.0 7.3  ALBUMIN 3.5 3.5 3.5  AST $Re'19 18 20  'wIS$ ALT $R'16 16 17  'yz$ ALKPHOS 82 93 103  BILITOT 0.7 0.8 0.6  RADIOGRAPHIC STUDIES: I have personally reviewed the radiological images as listed and agreed with the findings in the report. No results found.   ASSESSMENT & PLAN:   1. 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[place])   2. Tachycardia   3. Thrush   4. Stage 3a chronic kidney disease (HCC)    #Metastatic squamous cell carcinoma of oropharynx- cervical lymphadenopathy, lung metastatic disease Status post 3 cycles docetaxel with G-CSF support. Labs are reviewed and discussed with patient. His counts are stable.  No neutropenia.  Patient has leukocytosis with white count of 14.2, ANC 12.5.  Likely secondary to G-CSF  #Thrush has resolved.  Finish his course of Diflucan. #Tachycardia, regular.  Possibly due to decreased oral intake.  Patient received IV normal saline x1 today.  Patient will have follow-up with [facility] Dr. Barrington Ellison for repeat CT scan for evaluation of treatment response.   He has next appointment scheduled for next cycle of treatment which may need to be adjusted pending [facility]'s recommendation regarding involvement of clinical trials.  I asked patient to give [place] an update.  Advised patient to call the clinic if symptoms do not improve. Follow-up: 1 week Earlie Server, MD, PhD Hematology Oncology [facility] 08/31/2020

## 2020-08-31 NOTE — Progress Notes (Signed)
Pt here for follow up. Reports that he has been feeling nauseated since he started taking diflucan

## 2020-09-02 ENCOUNTER — Inpatient Hospital Stay: Payer: Medicare Other

## 2020-09-02 ENCOUNTER — Other Ambulatory Visit: Payer: Self-pay | Admitting: Oncology

## 2020-09-03 ENCOUNTER — Other Ambulatory Visit: Payer: Self-pay | Admitting: Oncology

## 2020-09-03 ENCOUNTER — Telehealth: Payer: Self-pay

## 2020-09-03 NOTE — Telephone Encounter (Signed)
[place] message received from MD:  [facility] wants to add Carboplatin Auc 5 to his Docetaxel.   No PA needed per Otho Perl  Scheduling will get him scheduled as MD requests.

## 2020-09-07 ENCOUNTER — Other Ambulatory Visit: Payer: Self-pay

## 2020-09-07 ENCOUNTER — Encounter: Payer: Self-pay | Admitting: Oncology

## 2020-09-07 ENCOUNTER — Inpatient Hospital Stay (HOSPITAL_BASED_OUTPATIENT_CLINIC_OR_DEPARTMENT_OTHER): Payer: Medicare Other | Admitting: Oncology

## 2020-09-07 ENCOUNTER — Telehealth: Payer: Self-pay

## 2020-09-07 ENCOUNTER — Inpatient Hospital Stay: Payer: Medicare Other | Attending: Oncology

## 2020-09-07 ENCOUNTER — Ambulatory Visit
Admission: RE | Admit: 2020-09-07 | Discharge: 2020-09-07 | Disposition: A | Discharge status: Home / Self Care | Payer: Self-pay | Source: Ambulatory Visit | Attending: Oncology | Admitting: Oncology

## 2020-09-07 ENCOUNTER — Inpatient Hospital Stay (HOSPITAL_BASED_OUTPATIENT_CLINIC_OR_DEPARTMENT_OTHER): Payer: Medicare Other | Admitting: Hospice and Palliative Medicine

## 2020-09-07 ENCOUNTER — Inpatient Hospital Stay: Payer: Medicare Other

## 2020-09-07 VITALS — BP 125/81 | HR 112 | Temp 97.3°F | Resp 18 | Wt 182.4 lb

## 2020-09-07 DIAGNOSIS — R1310 Dysphagia, unspecified: Secondary | ICD-10-CM | POA: Insufficient documentation

## 2020-09-07 DIAGNOSIS — C109 Malignant neoplasm of oropharynx, unspecified: Secondary | ICD-10-CM

## 2020-09-07 DIAGNOSIS — Z5111 Encounter for antineoplastic chemotherapy: Secondary | ICD-10-CM | POA: Insufficient documentation

## 2020-09-07 DIAGNOSIS — Z803 Family history of malignant neoplasm of breast: Secondary | ICD-10-CM | POA: Insufficient documentation

## 2020-09-07 DIAGNOSIS — N1831 Chronic kidney disease, stage 3a: Secondary | ICD-10-CM

## 2020-09-07 DIAGNOSIS — C770 Secondary and unspecified malignant neoplasm of lymph nodes of head, face and neck: Secondary | ICD-10-CM | POA: Insufficient documentation

## 2020-09-07 DIAGNOSIS — Z23 Encounter for immunization: Secondary | ICD-10-CM | POA: Insufficient documentation

## 2020-09-07 DIAGNOSIS — Z8042 Family history of malignant neoplasm of prostate: Secondary | ICD-10-CM | POA: Diagnosis not present

## 2020-09-07 DIAGNOSIS — Z931 Gastrostomy status: Secondary | ICD-10-CM | POA: Diagnosis not present

## 2020-09-07 DIAGNOSIS — Z79899 Other long term (current) drug therapy: Secondary | ICD-10-CM | POA: Insufficient documentation

## 2020-09-07 DIAGNOSIS — G893 Neoplasm related pain (acute) (chronic): Secondary | ICD-10-CM | POA: Insufficient documentation

## 2020-09-07 DIAGNOSIS — Z8052 Family history of malignant neoplasm of bladder: Secondary | ICD-10-CM | POA: Diagnosis not present

## 2020-09-07 DIAGNOSIS — R000 Tachycardia, unspecified: Secondary | ICD-10-CM | POA: Diagnosis not present

## 2020-09-07 DIAGNOSIS — Z808 Family history of malignant neoplasm of other organs or systems: Secondary | ICD-10-CM | POA: Diagnosis not present

## 2020-09-07 DIAGNOSIS — N183 Chronic kidney disease, stage 3 unspecified: Secondary | ICD-10-CM | POA: Diagnosis not present

## 2020-09-07 DIAGNOSIS — Z5189 Encounter for other specified aftercare: Secondary | ICD-10-CM | POA: Insufficient documentation

## 2020-09-07 DIAGNOSIS — C7800 Secondary malignant neoplasm of unspecified lung: Secondary | ICD-10-CM | POA: Diagnosis not present

## 2020-09-07 DIAGNOSIS — Z515 Encounter for palliative care: Secondary | ICD-10-CM

## 2020-09-07 DIAGNOSIS — Z95828 Presence of other vascular implants and grafts: Secondary | ICD-10-CM

## 2020-09-07 DIAGNOSIS — D649 Anemia, unspecified: Secondary | ICD-10-CM | POA: Diagnosis not present

## 2020-09-07 LAB — CBC WITH DIFFERENTIAL/PLATELET
Abs Immature Granulocytes: 0.03 10*3/uL (ref 0.00–0.07)
Basophils Absolute: 0.1 10*3/uL (ref 0.0–0.1)
Basophils Relative: 1 %
Eosinophils Absolute: 0 10*3/uL (ref 0.0–0.5)
Eosinophils Relative: 0 %
HCT: 34.6 % — ABNORMAL LOW (ref 39.0–52.0)
Hemoglobin: 11.4 g/dL — ABNORMAL LOW (ref 13.0–17.0)
Immature Granulocytes: 0 %
Lymphocytes Relative: 4 %
Lymphs Abs: 0.4 10*3/uL — ABNORMAL LOW (ref 0.7–4.0)
MCH: 29.5 pg (ref 26.0–34.0)
MCHC: 32.9 g/dL (ref 30.0–36.0)
MCV: 89.4 fL (ref 80.0–100.0)
Monocytes Absolute: 0.7 10*3/uL (ref 0.1–1.0)
Monocytes Relative: 8 %
Neutro Abs: 7.7 10*3/uL (ref 1.7–7.7)
Neutrophils Relative %: 87 %
Platelets: 421 10*3/uL — ABNORMAL HIGH (ref 150–400)
RBC: 3.87 MIL/uL — ABNORMAL LOW (ref 4.22–5.81)
RDW: 15.5 % (ref 11.5–15.5)
WBC: 9 10*3/uL (ref 4.0–10.5)
nRBC: 0 % (ref 0.0–0.2)

## 2020-09-07 LAB — COMPREHENSIVE METABOLIC PANEL
ALT: 16 U/L (ref 0–44)
AST: 20 U/L (ref 15–41)
Albumin: 3.4 g/dL — ABNORMAL LOW (ref 3.5–5.0)
Alkaline Phosphatase: 79 U/L (ref 38–126)
Anion gap: 9 (ref 5–15)
BUN: 14 mg/dL (ref 8–23)
CO2: 27 mmol/L (ref 22–32)
Calcium: 9.3 mg/dL (ref 8.9–10.3)
Chloride: 102 mmol/L (ref 98–111)
Creatinine, Ser: 1.38 mg/dL — ABNORMAL HIGH (ref 0.61–1.24)
GFR calc non Af Amer: 53 mL/min — ABNORMAL LOW (ref >60)
Glucose, Bld: 124 mg/dL — ABNORMAL HIGH (ref 70–99)
Potassium: 3.9 mmol/L (ref 3.5–5.1)
Sodium: 138 mmol/L (ref 135–145)
Total Bilirubin: 0.9 mg/dL (ref 0.3–1.2)
Total Protein: 7.3 g/dL (ref 6.5–8.1)

## 2020-09-07 MED ORDER — SODIUM CHLORIDE 0.9 % IV SOLN
150.0000 mg | Freq: Once | INTRAVENOUS | Status: AC
  Administered 2020-09-07: 150 mg via INTRAVENOUS
  Filled 2020-09-07: qty 150

## 2020-09-07 MED ORDER — HEPARIN SOD (PORK) LOCK FLUSH 100 UNIT/ML IV SOLN
500.0000 [IU] | Freq: Once | INTRAVENOUS | Status: AC | PRN
  Administered 2020-09-07: 500 [IU]
  Filled 2020-09-07: qty 5

## 2020-09-07 MED ORDER — HEPARIN SOD (PORK) LOCK FLUSH 100 UNIT/ML IV SOLN
INTRAVENOUS | Status: AC
  Filled 2020-09-07: qty 5

## 2020-09-07 MED ORDER — SODIUM CHLORIDE 0.9 % IV SOLN
75.0000 mg/m2 | Freq: Once | INTRAVENOUS | Status: AC
  Administered 2020-09-07: 150 mg via INTRAVENOUS
  Filled 2020-09-07: qty 15

## 2020-09-07 MED ORDER — SODIUM CHLORIDE 0.9% FLUSH
10.0000 mL | Freq: Once | INTRAVENOUS | Status: AC
  Administered 2020-09-07: 10 mL via INTRAVENOUS
  Filled 2020-09-07: qty 10

## 2020-09-07 MED ORDER — SODIUM CHLORIDE 0.9 % IV SOLN
430.0000 mg | Freq: Once | INTRAVENOUS | Status: AC
  Administered 2020-09-07: 430 mg via INTRAVENOUS
  Filled 2020-09-07: qty 43

## 2020-09-07 MED ORDER — PALONOSETRON HCL INJECTION 0.25 MG/5ML
0.2500 mg | Freq: Once | INTRAVENOUS | Status: AC
  Administered 2020-09-07: 0.25 mg via INTRAVENOUS
  Filled 2020-09-07: qty 5

## 2020-09-07 MED ORDER — SODIUM CHLORIDE 0.9 % IV SOLN
Freq: Once | INTRAVENOUS | Status: DC
  Filled 2020-09-07: qty 250

## 2020-09-07 MED ORDER — SODIUM CHLORIDE 0.9 % IV SOLN
Freq: Once | INTRAVENOUS | Status: AC
  Filled 2020-09-07: qty 250

## 2020-09-07 MED ORDER — SODIUM CHLORIDE 0.9 % IV SOLN
10.0000 mg | Freq: Once | INTRAVENOUS | Status: AC
  Administered 2020-09-07: 10 mg via INTRAVENOUS
  Filled 2020-09-07: qty 10

## 2020-09-07 NOTE — Progress Notes (Signed)
Hematology/Oncology  Follow Up note [facility] Telephone:(336) [phone] Fax:(336) [phone]   Patient Care Team: Albina Billet, MD as PCP - General (Internal Medicine) Albina Billet, MD (Internal Medicine) Bary Castilla, Forest Gleason, MD (General Surgery) Noreene Filbert, MD as Referring Physician (Radiation Oncology) Earlie Server, MD as Consulting Physician (Oncology)  CHIEF COMPLAINTS/PURPOSE OF CONSULTATION:  Follow up for head and neck cancer.  HISTORY OF PRESENTING ILLNESS:  Ivan Unrein. is a  64 y.o.  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 P16 positive. cT2 cN2 disease, stage II, # Concurrent ChemoRT  Cisplatin 100 mg/m2 q3weeks with concurrent RT which started on 12/31/2017.  S/p one dose of Cisplatin. Cisplatin discontinued due to nephrotoxicity. Switch to weekly Carboplatin (AUC 2) / Taxol 43m/m2 [ finished May 2019] Patient has finished concurrent chemoradiation in May 2019  # He reports doing well until in July 2020, he started to have hoarseness after singing for 2 to 3 hours. He has changed his insurance and Dr. MTami Ribasis no longer covered by his current insurance.  So he establish care [facility] [place] [facility]. Flexible laryngoscopy showed Vocal cord paralysis.  Patient was recommended to have a PET scan done for restaging. Patient's primary care provider Dr. THall Busingordered a PET scan for patient which was done on 08/20/2019. 08/20/2019 PET scan showed asymmetric hypermetabolic him involving the left vocal cords with hypermetabolic adenopathy in the neck, and the chest.  He also has hypermetabolic pulmonary nodule along the minor fissure which is felt to be metastatic. Tiny proximal left ureteral stone without hydronephrosis.  Possible gallbladder sludge.  Enlarged prostate.   Atherosclerosis.  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  # NGS showed PD-L1 CPS 20, TMB indeterminant, MSI stable,PIK3CA mutation Started on chemotherapy with 6 cycles of carboplatin, 5-FU and Keytruda, finished in February 2021.  And has been on Keytruda maintenance. Initially he had a good response, however progressed whiled on Keytruda maintenance.  PET 04/26/2020 worsening of disease in the chest.  Interval enlargement of the right thoracic inlet and paratracheal lymph nodes that were previously enlarged with new activity in the right hilum corresponding to a lymph node in this location.  Also increased activity in the right upper lobe nodule. Right level 3 lymph node in the neck and anterior mediastinal nodal activity is diminished.  These areas are quite small on prior study. Diminished symmetric uptake within the left as compared to the right vocal cord. Right mandible increased activity in the area of third molar.  This is consistent with his recent tooth infection.  [facility].  Patient was offered for clinical trial with pembrolizumab/lenvatinib versus standard therapy. Patient received additional 1 cycle of Keytruda while waiting to be enrolled to the trial.   #Disease progression.  [facility] right upper paratracheal node is concerning for metastatic disease and a new from 01/20/2020 PET/CT.  The node closely abuts and possibly invade the posterolateral right tracheal wall as well as the esophagus.  Right vocal cord paralysis.  Symmetric soft tissue density at the right base of tongue could reflect residual/recurrent primary malignancy versus posttreatment changes.  Right supraclavicular lymph node is no longer appreciated with post treatment  changes.  There is increased size of right minor fissure nodule which was metabolically avid on previous PET scan.  New mediastinal and hilar lymphadenopathy concerning for new nodal disease.  No evidence of metastatic disease below the diaphragm.  Nonobstructing 3 mm distal ureter stone.  07/07/2020 started on palliative chemotherapy with docetaxel  INTERVAL HISTORY Ivan Garcia. is a 64 y.o. male who presents for follow-up for metastatic squamous cell carcinoma, P 16+.   Patient has 3 cycles of docetaxel  with G-CSF support.   During interval patient has had CT scan done at [facility] which unfortunately showed progression of disease. Images are not available for me to review.  CT was compared to June 24, 2020 CT done at [facility]. 09/02/2020,chest without contrast with 3D MIPS protocol showed Interval increase in the size of the right upper lobe perifissural nodule 1.6 x 1.5 cm-previously 1.4 x 1.2 cm tracheoesophageal soft tissue mass 3.4 x 5.0 cm with regional mass-effect on the esophagus-previously 2.4 x 3.6 cm, and mediastinal lymphadenopathy-right upper paratracheal lymph node 1.9 cm-previously 1.5 cm, suspicious for progressive metastatic disease. Subcentimeter lung nodules are stable.  CT neck with contrast showed slight interval increase in size of upper right paratracheal nodal conglomerate.  Questionable soft tissue invasion of the posterior tracheal appears worsened.  Compression with potential invasion of the esophagus is again noted.  Prior vocal cord paralysis again noted.  Unchanged right tongue base asymmetry.  Patient presents to discuss further management plan.  He continues to have severe dysphagia symptoms.  He is able to drink fluid with no difficulties but has dysphagia with solids.    Patient was again found to be tachycardic with a heart rate of 112 in the clinic.  He has been tachycardic since September 2021.  He reports he is able to drink plenty of fluid.  Denies any  shortness of breath, chest pain.  He denies any previous cardiovascular disease.  He is on cholesterol medication.  Review of Systems  Constitutional: Negative for chills, fever, malaise/fatigue and weight loss.  HENT: Negative for sore throat.        Ritta Slot has improved  Eyes: Negative for redness.  Respiratory: Negative for cough, shortness of breath and wheezing.   Cardiovascular: Negative for chest pain, palpitations and leg swelling.  Gastrointestinal: Negative for abdominal pain, blood in stool, nausea and vomiting.       Still has dysphagia  Genitourinary: Negative for dysuria.  Musculoskeletal: Negative for myalgias.  Skin: Negative for rash.  Neurological: Negative for dizziness, tingling and tremors.  Endo/Heme/Allergies: Does not bruise/bleed easily.  Psychiatric/Behavioral: Negative for hallucinations.    MEDICAL HISTORY:  Past Medical History:  Diagnosis Date  . Arthritis   . Cancer ([place])    Head and neck cancer  . Hemorrhoids   . Hyperlipidemia   . Hypertension     SURGICAL HISTORY: Past Surgical History:  Procedure Laterality Date  . COLONOSCOPY WITH PROPOFOL N/A 04/04/2017   Procedure: COLONOSCOPY WITH PROPOFOL;  Surgeon: Robert Bellow, MD;  Location: [facility] ENDOSCOPY;  Service: Endoscopy;  Laterality: N/A;  . MANDIBLE SURGERY    . PORT-A-CATH REMOVAL  07/17/2018  . PORTA CATH INSERTION N/A 12/19/2017   Procedure: PORTA CATH INSERTION;  Surgeon: Algernon Huxley, MD;  Location: [place] CV LAB;  Service: Cardiovascular;  Laterality: N/A;  . PORTA CATH INSERTION N/A 07/17/2018   Procedure: PORTA CATH INSERTION;  Surgeon: Leotis Pain  S, MD;  Location: [place] CV LAB;  Service: Cardiovascular;  Laterality: N/A;  . PORTA CATH INSERTION N/A 09/18/2019   Procedure: PORTA CATH INSERTION;  Surgeon: Algernon Huxley, MD;  Location: [place] CV LAB;  Service: Cardiovascular;  Laterality: N/A;  . TONSILLECTOMY      SOCIAL HISTORY: Social History    Socioeconomic History  . Marital status: Single    Spouse name: Not on file  . Number of children: Not on file  . Years of education: Not on file  . Highest education level: Not on file  Occupational History  . Not on file  Tobacco Use  . Smoking status: Never Smoker  . Smokeless tobacco: Never Used  Vaping Use  . Vaping Use: Never used  Substance and Sexual Activity  . Alcohol use: No  . Drug use: No  . Sexual activity: Not on file  Other Topics Concern  . Not on file  Social History Narrative  . Not on file   Social Determinants of Health   Financial Resource Strain:   . Difficulty of Paying Living Expenses: Not on file  Food Insecurity:   . Worried About Charity fundraiser in the Last Year: Not on file  . Ran Out of Food in the Last Year: Not on file  Transportation Needs:   . Lack of Transportation (Medical): Not on file  . Lack of Transportation (Non-Medical): Not on file  Physical Activity:   . Days of Exercise per Week: Not on file  . Minutes of Exercise per Session: Not on file  Stress:   . Feeling of Stress : Not on file  Social Connections:   . Frequency of Communication with Friends and Family: Not on file  . Frequency of Social Gatherings with Friends and Family: Not on file  . Attends Religious Services: Not on file  . Active Member of Clubs or Organizations: Not on file  . Attends Archivist Meetings: Not on file  . Marital Status: Not on file  Intimate Partner Violence:   . Fear of Current or Ex-Partner: Not on file  . Emotionally Abused: Not on file  . Physically Abused: Not on file  . Sexually Abused: Not on file    FAMILY HISTORY: Family History  Problem Relation Age of Onset  . Breast cancer Mother   . Asthma Mother   . Congestive Heart Failure Mother   . Prostate cancer Father   . Brain cancer Father   . Bladder Cancer Father     ALLERGIES:  has No Known Allergies.  MEDICATIONS:  Current Outpatient Medications   Medication Sig Dispense Refill  . amLODipine (NORVASC) 5 MG tablet Take 5 mg by mouth daily.    Marland Kitchen dexamethasone (DECADRON) 1 MG tablet Take 1 mg by mouth 2 (two) times daily with a meal.    . DULoxetine (CYMBALTA) 30 MG capsule TAKE 1 CAPSULE BY MOUTH EVERY DAY 30 capsule 0  . lidocaine (XYLOCAINE) 2 % solution     . lidocaine-prilocaine (EMLA) cream Apply to affected area once 30 g 3  . Multiple Vitamins-Minerals (MULTIVITAMIN WITH MINERALS) tablet Take 1 tablet by mouth daily.    Marland Kitchen omeprazole (PRILOSEC) 20 MG capsule TAKE 1 CAPSULE BY MOUTH EVERY DAY (Patient taking differently: Take 20 mg by mouth daily as needed. ) 90 capsule 1  . ondansetron (ZOFRAN) 8 MG tablet Take 1 tablet (8 mg total) by mouth 2 (two) times daily as needed for refractory nausea /  vomiting. Start on day 3 after carboplatin chemo. 30 tablet 1  . oxyCODONE (ROXICODONE) 5 MG immediate release tablet Take 1 tablet (5 mg total) by mouth every 12 (twelve) hours as needed for moderate pain, severe pain or breakthrough pain. 30 tablet 0  . prochlorperazine (COMPAZINE) 10 MG tablet Take 1 tablet (10 mg total) by mouth every 6 (six) hours as needed (Nausea or vomiting). 30 tablet 1  . rosuvastatin (CRESTOR) 10 MG tablet Take 10 mg by mouth every evening.  4  . nystatin (MYCOSTATIN) 100000 UNIT/ML suspension Take 5 mLs (500,000 Units total) by mouth in the morning, at noon, and at bedtime. SWISH AND SPIT (Patient not taking: Reported on 09/07/2020) 473 mL 0   No current facility-administered medications for this visit.   Facility-Administered Medications Ordered in Other Visits  Medication Dose Route Frequency Provider Last Rate Last Admin  . sodium chloride flush (NS) 0.9 % injection 10 mL  10 mL Intravenous PRN Earlie Server, MD         PHYSICAL EXAMINATION: ECOG PERFORMANCE STATUS: 1 - Symptomatic but completely ambulatory Vitals:   09/07/20 0859  BP: 125/81  Pulse: (!) 112  Resp: 18  Temp: (!) 97.3 F (36.3 C)   Physical  Exam Constitutional:      General: He is not in acute distress.    Appearance: He is not diaphoretic.  HENT:     Head: Normocephalic and atraumatic.     Nose: Nose normal.     Mouth/Throat:     Pharynx: No oropharyngeal exudate.     Comments: Ritta Slot has resolved Eyes:     General: No scleral icterus.    Pupils: Pupils are equal, round, and reactive to light.  Cardiovascular:     Rate and Rhythm: Regular rhythm. Tachycardia present.     Heart sounds: No murmur heard.   Pulmonary:     Effort: Pulmonary effort is normal. No respiratory distress.     Breath sounds: No rales.  Chest:     Chest wall: No tenderness.  Abdominal:     General: There is no distension.     Palpations: Abdomen is soft.     Tenderness: There is no abdominal tenderness.  Musculoskeletal:        General: Normal range of motion.     Cervical back: Normal range of motion and neck supple.  Skin:    General: Skin is warm and dry.     Findings: No erythema.  Neurological:     Mental Status: He is alert and oriented to person, place, and time.     Cranial Nerves: No cranial nerve deficit.     Motor: No abnormal muscle tone.     Coordination: Coordination normal.  Psychiatric:        Mood and Affect: Affect normal.      LABORATORY DATA:  I have reviewed the data as listed Lab Results  Component Value Date   WBC 9.0 09/07/2020   HGB 11.4 (L) 09/07/2020   HCT 34.6 (L) 09/07/2020   MCV 89.4 09/07/2020   PLT 421 (H) 09/07/2020   Recent Labs    08/17/20 0804 08/17/20 0804 08/23/20 1414 08/31/20 1244 09/07/20 0833  NA 139   < > 137 137 138  K 4.0   < > 3.6 4.5 3.9  CL 105   < > 100 98 102  CO2 25   < > $R'26 27 27  'Gf$ GLUCOSE 101*   < > 129* 134* 124*  BUN  23   < > 21 25* 14  CREATININE 1.52*   < > 1.50* 1.64* 1.38*  CALCIUM 9.2   < > 8.9 9.3 9.3  GFRNONAA 47*   < > 48* 43* 53*  GFRAA 55*  --  56* 50*  --   PROT 7.4   < > 7.0 7.3 7.3  ALBUMIN 3.5   < > 3.5 3.5 3.4*  AST 19   < > $R'18 20 20  'DB$ ALT 16    < > $R'16 17 16  'sV$ ALKPHOS 82   < > 93 103 79  BILITOT 0.7   < > 0.8 0.6 0.9   < > = values in this interval not displayed.  RADIOGRAPHIC STUDIES: I have personally reviewed the radiological images as listed and agreed with the findings in the report. No results found.   ASSESSMENT & PLAN:   1. Tachycardia   2. 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HCC)   3. Stage 3a chronic kidney disease ([place])   4. Dysphagia, unspecified type   5. Encounter for antineoplastic chemotherapy    #Metastatic squamous cell carcinoma of oropharynx- cervical lymphadenopathy, lung metastatic disease Status post 3 cycles docetaxel with G-CSF support. Unfortunately, his disease progressed on docetaxel. I had a discussion with patient's [facility] oncologist Dr. Barrington Ellison over the phone. Dr.Choe recommends adding carboplatin AUC 5 to docetaxel regimen for now which hopefully to help to reduce the size of the tracheoesophageal soft tissue mass.  Also recommend palliative radiation.  Patient prefers to do radiation locally and I will arrange patient to reestablish care with Dr. Baruch Gouty.  I discussed with Dr. Baruch Gouty about patient's case. In addition, recommend placement of feeding tube prior to RT given the high likelihood of severe dysphagia due to progressive disease and swelling from radiation. I had a lengthy discussion with patient during his encounter and later I also called him for further discussion.  He was initially reluctant for feeding tube placement and eventually agrees after I explained details about the rationale of feeding tube placement.  Will refer to IR for evaluation. -Patient's most recent images were done at [facility].  I will start the process of requesting images to be sent over to [place] for radiology consultation,  for Rad Onc as well as IR to review  Labs are reviewed and discussed with patient.  Rationale of adding carboplatin to docetaxel was discussed with patient.  He has had this medication previously.  He  agrees with the plan. Patient will receive G-CSF.  Dysphagia, plan feeding tube placement. Tachycardia, patient had EKG done which was reviewed by me.  Sinus tachycardia, nonspecific T wave changes.  I do not have any previous EKG to compare with.  I recommend patient to see primary care provider for further discussion.  Continue his cholesterol pills.  He does not have any symptoms of MI today. Tachycardia is probably secondary to poor oral intake/dehydration.  He will receive IV normal saline x1 today and repeat another hydration session tomorrow.  Dr. Barrington Ellison at [facility] did not have any clinical trials ready for patient at this moment.  Dr. Barrington Ellison will refer patient to [facility] to see if there is any other trials available for patient at this point.  Patient is made aware about that.  He will also have a telemedicine with Dr. Barrington Ellison tomorrow.  Patient need to continue to follow-up with palliative care service. Advised patient to call the clinic if symptoms do not improve. CcDr.Chrystal, and palliative care service.  Follow-up: 1 week Earlie Server, MD, PhD Hematology Oncology [place] at [facility] 09/07/2020

## 2020-09-07 NOTE — Telephone Encounter (Signed)
Order for IR G-tube placement entered in computer and faxed to specialty scheduling.

## 2020-09-07 NOTE — Progress Notes (Signed)
Pt ok to proceed with tx today. Dr. Tasia Catchings ok with HR 112. He will need 1L IVF.

## 2020-09-07 NOTE — Progress Notes (Signed)
Patient here for follow up. Reports that he had a CT scan at [facility] "it didn't look good." pt is having trouble swallowing and is considering feeding tube. States he does not want treatment today.

## 2020-09-08 ENCOUNTER — Encounter: Payer: Self-pay | Admitting: Radiation Oncology

## 2020-09-08 ENCOUNTER — Inpatient Hospital Stay: Payer: Medicare Other

## 2020-09-08 ENCOUNTER — Ambulatory Visit
Admission: RE | Admit: 2020-09-08 | Discharge: 2020-09-08 | Disposition: A | Discharge status: Home / Self Care | Payer: Medicare Other | Source: Ambulatory Visit | Attending: Radiation Oncology | Admitting: Radiation Oncology

## 2020-09-08 VITALS — BP 130/80 | HR 92 | Temp 97.2°F | Resp 18

## 2020-09-08 VITALS — BP 141/86 | HR 97 | Resp 18 | Wt 186.0 lb

## 2020-09-08 DIAGNOSIS — C01 Malignant neoplasm of base of tongue: Secondary | ICD-10-CM | POA: Diagnosis not present

## 2020-09-08 DIAGNOSIS — Z923 Personal history of irradiation: Secondary | ICD-10-CM | POA: Insufficient documentation

## 2020-09-08 DIAGNOSIS — N183 Chronic kidney disease, stage 3 unspecified: Secondary | ICD-10-CM | POA: Insufficient documentation

## 2020-09-08 DIAGNOSIS — Z9221 Personal history of antineoplastic chemotherapy: Secondary | ICD-10-CM | POA: Diagnosis not present

## 2020-09-08 DIAGNOSIS — Z5111 Encounter for antineoplastic chemotherapy: Secondary | ICD-10-CM | POA: Diagnosis not present

## 2020-09-08 DIAGNOSIS — C778 Secondary and unspecified malignant neoplasm of lymph nodes of multiple regions: Secondary | ICD-10-CM | POA: Insufficient documentation

## 2020-09-08 DIAGNOSIS — C109 Malignant neoplasm of oropharynx, unspecified: Secondary | ICD-10-CM

## 2020-09-08 MED ORDER — HEPARIN SOD (PORK) LOCK FLUSH 100 UNIT/ML IV SOLN
500.0000 [IU] | Freq: Once | INTRAVENOUS | Status: AC
  Administered 2020-09-08: 500 [IU] via INTRAVENOUS
  Filled 2020-09-08: qty 5

## 2020-09-08 MED ORDER — SODIUM CHLORIDE 0.9 % IV SOLN
Freq: Once | INTRAVENOUS | Status: AC
  Filled 2020-09-08: qty 250

## 2020-09-08 MED ORDER — SODIUM CHLORIDE 0.9% FLUSH
10.0000 mL | INTRAVENOUS | Status: DC | PRN
  Administered 2020-09-08: 10 mL via INTRAVENOUS
  Filled 2020-09-08: qty 10

## 2020-09-08 NOTE — Progress Notes (Signed)
Radiation Oncology Follow up Note (OP N/A) recurrent head and neck cancer  Name: Ivan Garcia.   Date:   09/08/2020 MRN:  [id number] DOB: 05/20/1956    This 64 y.o. male presents to the clinic today for recurrent head and neck cancer.  Inpatient treated back in 2019 for locally advanced base of tongue cancer p16 positive squamous cell carcinoma with concurrent chemoradiation now with recurrent disease in his chest  REFERRING PROVIDER: Albina Billet, MD  HPI: Patient is a 64 year old male treated back in 2019 with concurrent chemoradiation therapy for locally advanced squamous cell carcinoma the base of tongue p16 positive..  Patient also has known stage IIIa chronic renal disease.  He has been followed by Dr. Tasia Catchings for recurrent metastatic squamous cell carcinoma with cervical lymphadenopathy and mediastinal adenopathy secondary to metastatic disease.  He has been treated with doxy Taxol with noted progression of disease by CT criteria.  He is developing increasing dysphagia secondary to extrinsic compression on his esophagus from mediastinal nodes.  He is being scheduled for PEG tube placement.  Recent CT scan at [facility] showed interval increase in size of right upper lobe nodule increasing tracheoesophageal soft tissue mass and mediastinal lymphadenopathy all suspicious progressive metastatic disease.  He had a PET CT scan back in May showing worsening of disease in his chest with interval enlargement right thoracic inlet and paratracheal lymph nodes with hypermetabolic activity.  Also had some slight up hypermetabolic activity in the right upper lobe nodule.  He did have right level 3 lymph nodes at that time which showed diminished hypermetabolic activity and were quite small on previous study.  He has a recommendation for [facility] for palliative radiation therapy as well as adding carboplatin to his treatment regimen.  He is seen today for evaluation.  Mostly he is has dysphagia to have anything but  liquids.  He is having no pain at this time.  COMPLICATIONS OF TREATMENT: none  FOLLOW UP COMPLIANCE: keeps appointments   PHYSICAL EXAM:  BP (!) 141/86 (BP Location: Left Arm, Patient Position: Sitting)   Pulse 97   Resp 18   Wt 186 lb (84.4 kg)   BMI 26.69 kg/m  He does have some fullness in the right supraclavicular region no distinct nodularity is noted.  Well-developed well-nourished patient in NAD. HEENT reveals PERLA, EOMI, discs not visualized.  Oral cavity is clear. No oral mucosal lesions are identified. Neck is clear without evidence of cervical or supraclavicular adenopathy. Lungs are clear to A&P. Cardiac examination is essentially unremarkable with regular rate and rhythm without murmur rub or thrill. Abdomen is benign with no organomegaly or masses noted. Motor sensory and DTR levels are equal and symmetric in the upper and lower extremities. Cranial nerves II through XII are grossly intact. Proprioception is intact. No peripheral adenopathy or edema is identified. No motor or sensory levels are noted. Crude visual fields are within normal range.  RADIOLOGY RESULTS: CT scans and PET CT scans all reviewed compatible with above-stated findings  PLAN: Present time elect to go ahead with radiation therapy based on his PET/CT findings.  I would use PET CT fusion studies to treat up to 60 Gray to the areas of hypermetabolic activity in the thoracic inlet.  The area of the right upper lobe nodule is still quite small I would continue to observe that area at this time and offer SBRT in the future should this continue to increase in size.  I would plan on using IMRT treatment  planning and delivery based on previous radiated fields in close proximity close proximity of his esophagus heart spinal cord.  Risks and benefits of treatment including possible worsening of his dysphagia during treatment fatigue alteration of blood count skin reaction all were described in detail to the patient.  I have  personally set up and ordered CT simulation for next week.  There will be extra effort by both professional staff as well as technical staff to coordinate and manage concurrent chemoradiation and ensuing side effects during his treatments.  Patient comprehends my recommendations well.   I would like to take this opportunity to thank you for allowing me to participate in the care of your patient.Noreene Filbert, MD

## 2020-09-09 ENCOUNTER — Inpatient Hospital Stay: Payer: Medicare Other

## 2020-09-09 ENCOUNTER — Other Ambulatory Visit: Payer: Self-pay

## 2020-09-09 ENCOUNTER — Other Ambulatory Visit: Payer: Self-pay | Admitting: Oncology

## 2020-09-09 DIAGNOSIS — Z5111 Encounter for antineoplastic chemotherapy: Secondary | ICD-10-CM | POA: Diagnosis not present

## 2020-09-09 DIAGNOSIS — C109 Malignant neoplasm of oropharynx, unspecified: Secondary | ICD-10-CM

## 2020-09-09 MED ORDER — FILGRASTIM-SNDZ 480 MCG/0.8ML IJ SOSY: 480.0000 ug | PREFILLED_SYRINGE | Freq: Once | INTRAMUSCULAR | Status: DC

## 2020-09-09 MED ORDER — PEGFILGRASTIM-JMDB 6 MG/0.6ML ~~LOC~~ SOSY
6.0000 mg | PREFILLED_SYRINGE | Freq: Once | SUBCUTANEOUS | Status: AC
  Administered 2020-09-09: 6 mg via SUBCUTANEOUS
  Filled 2020-09-09: qty 0.6

## 2020-09-09 NOTE — Progress Notes (Signed)
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 Patient currently receiving docetaxol.  Planning feeding tube placement on 10/15.  Also planning radiation therapy.   Acute add on today to begin education on feeding tube.    Patient taking about 3 ensure orally, tomato soup, homemade protein shake due to trouble swallowing. Patient receiving IV fluids in clinic several times weekly until able to place feeding tube.    Medications: reviewed  Labs: reviewed  Anthropometrics:   Weight 186 lb on 10/6 decreased from 188 lb on 9/23  200 lb on 05/14/20   Estimated Energy Needs  Kcals: 2100-2500 Protein: 105-125 g Fluid: 2 L  NUTRITION DIAGNOSIS: Inadequate oral intake continues   INTERVENTION:  Complimentary case of ensure enlive given to patient. Patient aware that he can drink 6 shakes per day to better meet nutritional needs as unable to eat solid foods Patient was able to see and administer water via sample feeding tube.   Discussed care for PEG tube.  Handwritten instructions given to patient.   Recommend [employer] 1.4, 5 cartons per day (8am, 11, noon, 2pm, 5pm and 8pm).  Flush with 50ml water before and 117ml after each feeding. Will provide 2275 kcals, 100 g protein and 2070 ml free water. Patient will need to give or drink additional 2105ml for better hydration (total free water 2362ml/d.  Briefly discussed feeding regimen with patient today but will need review at next visit.     MONITORING, EVALUATION, GOAL: weight trends, intake   NEXT VISIT: October 14 after radiation   Coltin Casher [name], [place], [place] Registered Dietitian [phone] (mobile)

## 2020-09-09 NOTE — Telephone Encounter (Signed)
Received a message from Bo Mcclintock, radiology RN requestin a CT abdomen for radiologist to view to ensure PEG placement is appropriate for patient.    CT abdomen wo contrast ordered due to patient has CKD.    Patient is scheudled for CT on 09/15/20 @ 1:00 Radiation simulation on 09/16/2020 PEG placement by IR on 09/17/2020 @ 10:30 to arrive @ 9:30.  Patient aware of all appointments by scheduler [facility].

## 2020-09-13 ENCOUNTER — Inpatient Hospital Stay: Payer: Medicare Other

## 2020-09-13 ENCOUNTER — Encounter: Payer: Self-pay | Admitting: Oncology

## 2020-09-13 ENCOUNTER — Other Ambulatory Visit: Payer: Self-pay

## 2020-09-13 ENCOUNTER — Inpatient Hospital Stay (HOSPITAL_BASED_OUTPATIENT_CLINIC_OR_DEPARTMENT_OTHER): Payer: Medicare Other | Admitting: Oncology

## 2020-09-13 VITALS — BP 116/71 | HR 95 | Temp 97.5°F

## 2020-09-13 VITALS — BP 117/75 | HR 113 | Temp 97.4°F | Resp 16 | Ht 70.0 in | Wt 183.0 lb

## 2020-09-13 DIAGNOSIS — C109 Malignant neoplasm of oropharynx, unspecified: Secondary | ICD-10-CM

## 2020-09-13 DIAGNOSIS — R1310 Dysphagia, unspecified: Secondary | ICD-10-CM | POA: Diagnosis not present

## 2020-09-13 DIAGNOSIS — R000 Tachycardia, unspecified: Secondary | ICD-10-CM | POA: Diagnosis not present

## 2020-09-13 DIAGNOSIS — Z5111 Encounter for antineoplastic chemotherapy: Secondary | ICD-10-CM | POA: Diagnosis not present

## 2020-09-13 DIAGNOSIS — N1831 Chronic kidney disease, stage 3a: Secondary | ICD-10-CM

## 2020-09-13 DIAGNOSIS — D649 Anemia, unspecified: Secondary | ICD-10-CM

## 2020-09-13 LAB — CBC WITH DIFFERENTIAL/PLATELET
Abs Immature Granulocytes: 0.46 10*3/uL — ABNORMAL HIGH (ref 0.00–0.07)
Basophils Absolute: 0.2 10*3/uL — ABNORMAL HIGH (ref 0.0–0.1)
Basophils Relative: 3 %
Eosinophils Absolute: 0.1 10*3/uL (ref 0.0–0.5)
Eosinophils Relative: 2 %
HCT: 32 % — ABNORMAL LOW (ref 39.0–52.0)
Hemoglobin: 10.6 g/dL — ABNORMAL LOW (ref 13.0–17.0)
Immature Granulocytes: 7 %
Lymphocytes Relative: 5 %
Lymphs Abs: 0.3 10*3/uL — ABNORMAL LOW (ref 0.7–4.0)
MCH: 30.1 pg (ref 26.0–34.0)
MCHC: 33.1 g/dL (ref 30.0–36.0)
MCV: 90.9 fL (ref 80.0–100.0)
Monocytes Absolute: 0.4 10*3/uL (ref 0.1–1.0)
Monocytes Relative: 5 %
Neutro Abs: 5.3 10*3/uL (ref 1.7–7.7)
Neutrophils Relative %: 78 %
Platelets: 212 10*3/uL (ref 150–400)
RBC: 3.52 MIL/uL — ABNORMAL LOW (ref 4.22–5.81)
RDW: 15.6 % — ABNORMAL HIGH (ref 11.5–15.5)
Smear Review: ADEQUATE
WBC: 6.8 10*3/uL (ref 4.0–10.5)
nRBC: 0 % (ref 0.0–0.2)

## 2020-09-13 LAB — COMPREHENSIVE METABOLIC PANEL
ALT: 17 U/L (ref 0–44)
AST: 21 U/L (ref 15–41)
Albumin: 3.5 g/dL (ref 3.5–5.0)
Alkaline Phosphatase: 100 U/L (ref 38–126)
Anion gap: 9 (ref 5–15)
BUN: 17 mg/dL (ref 8–23)
CO2: 26 mmol/L (ref 22–32)
Calcium: 9.2 mg/dL (ref 8.9–10.3)
Chloride: 99 mmol/L (ref 98–111)
Creatinine, Ser: 1.3 mg/dL — ABNORMAL HIGH (ref 0.61–1.24)
GFR, Estimated: 57 mL/min — ABNORMAL LOW (ref >60)
Glucose, Bld: 135 mg/dL — ABNORMAL HIGH (ref 70–99)
Potassium: 3.9 mmol/L (ref 3.5–5.1)
Sodium: 134 mmol/L — ABNORMAL LOW (ref 135–145)
Total Bilirubin: 0.8 mg/dL (ref 0.3–1.2)
Total Protein: 6.7 g/dL (ref 6.5–8.1)

## 2020-09-13 MED ORDER — SODIUM CHLORIDE 0.9% FLUSH
10.0000 mL | INTRAVENOUS | Status: DC | PRN
  Administered 2020-09-13: 10 mL via INTRAVENOUS
  Filled 2020-09-13: qty 10

## 2020-09-13 MED ORDER — HEPARIN SOD (PORK) LOCK FLUSH 100 UNIT/ML IV SOLN
500.0000 [IU] | Freq: Once | INTRAVENOUS | Status: AC
  Administered 2020-09-13: 500 [IU] via INTRAVENOUS
  Filled 2020-09-13: qty 5

## 2020-09-13 MED ORDER — SODIUM CHLORIDE 0.9 % IV SOLN
Freq: Once | INTRAVENOUS | Status: AC
  Filled 2020-09-13: qty 250

## 2020-09-13 NOTE — Progress Notes (Signed)
Hematology/Oncology  Follow Up note [facility] Telephone:(336) [phone] Fax:([phone]   Patient Care Team: Albina Billet, MD as PCP - General (Internal Medicine) Albina Billet, MD (Internal Medicine) Bary Castilla, Forest Gleason, MD (General Surgery) Noreene Filbert, MD as Referring Physician (Radiation Oncology) Earlie Server, MD as Consulting Physician (Oncology)  CHIEF COMPLAINTS/PURPOSE OF CONSULTATION:  Follow up for head and neck cancer.  HISTORY OF PRESENTING ILLNESS:  Ivan Garcia. is a  64 y.o.  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 P16 positive. cT2 cN2 disease, stage II, # Concurrent ChemoRT  Cisplatin 100 mg/m2 q3weeks with concurrent RT which started on 12/31/2017.  S/p one dose of Cisplatin. Cisplatin discontinued due to nephrotoxicity. Switch to weekly Carboplatin (AUC 2) / Taxol 43m/m2 [ finished May 2019] Patient has finished concurrent chemoradiation in May 2019  # He reports doing well until in July 2020, he started to have hoarseness after singing for 2 to 3 hours. He has changed his insurance and Dr. MTami Ribasis no longer covered by his current insurance.  So he establish care [facility] [place] [place] [facility]. Flexible laryngoscopy showed Vocal cord paralysis.  Patient was recommended to have a PET scan done for restaging. Patient's primary care provider Dr. THall Busingordered a PET scan for patient which was done on 08/20/2019. 08/20/2019 PET scan showed asymmetric hypermetabolic him involving the left vocal cords with hypermetabolic adenopathy in the neck, and the chest.  He also has hypermetabolic pulmonary nodule along the minor fissure which is felt to be metastatic. Tiny proximal left ureteral stone without hydronephrosis.  Possible gallbladder sludge.  Enlarged prostate.   Atherosclerosis.  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  # NGS showed PD-L1 CPS 20, TMB indeterminant, MSI stable,PIK3CA mutation Started on chemotherapy with 6 cycles of carboplatin, 5-FU and Keytruda, finished in February 2021.  And has been on Keytruda maintenance. Initially he had a good response, however progressed whiled on Keytruda maintenance.  PET 04/26/2020 worsening of disease in the chest.  Interval enlargement of the right thoracic inlet and paratracheal lymph nodes that were previously enlarged with new activity in the right hilum corresponding to a lymph node in this location.  Also increased activity in the right upper lobe nodule. Right level 3 lymph node in the neck and anterior mediastinal nodal activity is diminished.  These areas are quite small on prior study. Diminished symmetric uptake within the left as compared to the right vocal cord. Right mandible increased activity in the area of third molar.  This is consistent with his recent tooth infection.  [facility].  Patient was offered for clinical trial with pembrolizumab/lenvatinib versus standard therapy. Patient received additional 1 cycle of Keytruda while waiting to be enrolled to the trial.   #Disease progression.  [facility] right upper paratracheal node is concerning for metastatic disease and a new from 01/20/2020 PET/CT.  The node closely abuts and possibly invade the posterolateral right tracheal wall as well as the esophagus.  Right vocal cord paralysis.  Symmetric soft tissue density at the right base of tongue could reflect residual/recurrent primary malignancy versus posttreatment changes.  Right supraclavicular lymph node is no longer appreciated with post treatment  changes.  There is increased size of right minor fissure nodule which was metabolically avid on previous PET scan.  New mediastinal and hilar lymphadenopathy concerning for new nodal disease.  No evidence of metastatic disease below the diaphragm.  Nonobstructing 3 mm distal ureter stone.  07/07/2020 started on palliative chemotherapy with docetaxel 09/02/2020 3 cycles of docetaxel  with G-CSF support.    CT scan done at [facility] which unfortunately showed progression of disease. Images are not available for me to review.  CT was compared to June 24, 2020 CT done at [facility]. 09/02/2020,chest without contrast with 3D MIPS protocol showed Interval increase in the size of the right upper lobe perifissural nodule 1.6 x 1.5 cm-previously 1.4 x 1.2 cm tracheoesophageal soft tissue mass 3.4 x 5.0 cm with regional mass-effect on the esophagus-previously 2.4 x 3.6 cm, and mediastinal lymphadenopathy-right upper paratracheal lymph node 1.9 cm-previously 1.5 cm, suspicious for progressive metastatic disease. Subcentimeter lung nodules are stable. CT neck with contrast showed slight interval increase in size of upper right paratracheal nodal conglomerate.  Questionable soft tissue invasion of the posterior tracheal appears worsened.  Compression with potential invasion of the esophagus is again noted.  Prior vocal cord paralysis again noted.  Unchanged right tongue base asymmetry. I had a discussion with patient's [facility] oncologist Dr. Barrington Ellison over the phone. Dr.Choe recommends adding carboplatin AUC 5 to docetaxel regimen for now which hopefully to help to reduce the size of the tracheoesophageal soft tissue mass.  Also recommend palliative radiation.   INTERVAL HISTORY Ivan Daluz Joram Venson. is a 64 y.o. male who presents for follow-up for metastatic squamous cell carcinoma, P 16+.   Patient is status post carboplatin and docetaxel 1 week ago.  He reports swelling has improved and he was able to swallow pizza last week.  He  drinks about 4 bottles of water per day.  Denies any palpitation, chest pain.  No shortness of breath, Shortness of breath, chest pain.  He denies any previous cardiovascular disease.  He is on cholesterol medication.  Review of Systems  Constitutional: Negative for chills, fever, malaise/fatigue and weight loss.  HENT: Negative for sore throat.        Improved dysphagia  Eyes: Negative for redness.  Respiratory: Negative for cough, shortness of breath and wheezing.   Cardiovascular: Negative for chest pain, palpitations and leg swelling.  Gastrointestinal: Negative for abdominal pain, blood in stool, nausea and vomiting.       Still has dysphagia  Genitourinary: Negative for dysuria.  Musculoskeletal: Negative for myalgias.  Skin: Negative for rash.  Neurological: Negative for dizziness, tingling and tremors.  Endo/Heme/Allergies: Does not bruise/bleed easily.  Psychiatric/Behavioral: Negative for hallucinations.    MEDICAL HISTORY:  Past Medical History:  Diagnosis Date  . Arthritis   . Cancer ([place])    Head and neck cancer  . Hemorrhoids   . Hyperlipidemia   . Hypertension     SURGICAL HISTORY: Past Surgical History:  Procedure Laterality Date  . COLONOSCOPY WITH PROPOFOL N/A 04/04/2017   Procedure: COLONOSCOPY WITH PROPOFOL;  Surgeon: Robert Bellow, MD;  Location: [facility] ENDOSCOPY;  Service: Endoscopy;  Laterality: N/A;  . MANDIBLE SURGERY    . PORT-A-CATH REMOVAL  07/17/2018  . PORTA CATH INSERTION N/A 12/19/2017   Procedure: PORTA CATH INSERTION;  Surgeon: Algernon Huxley, MD;  Location: [place] CV LAB;  Service: Cardiovascular;  Laterality: N/A;  . PORTA CATH INSERTION N/A  07/17/2018   Procedure: PORTA CATH INSERTION;  Surgeon: Algernon Huxley, MD;  Location: [place] CV LAB;  Service: Cardiovascular;  Laterality: N/A;  . PORTA CATH INSERTION N/A 09/18/2019   Procedure: PORTA CATH INSERTION;  Surgeon: Algernon Huxley, MD;  Location: [place] [facility] LAB;  Service:  Cardiovascular;  Laterality: N/A;  . TONSILLECTOMY      SOCIAL HISTORY: Social History   Socioeconomic History  . Marital status: Single    Spouse name: Not on file  . Number of children: Not on file  . Years of education: Not on file  . Highest education level: Not on file  Occupational History  . Not on file  Tobacco Use  . Smoking status: Never Smoker  . Smokeless tobacco: Never Used  Vaping Use  . Vaping Use: Never used  Substance and Sexual Activity  . Alcohol use: No  . Drug use: No  . Sexual activity: Not on file  Other Topics Concern  . Not on file  Social History Narrative  . Not on file   Social Determinants of Health   Financial Resource Strain:   . Difficulty of Paying Living Expenses: Not on file  Food Insecurity:   . Worried About Charity fundraiser in the Last Year: Not on file  . Ran Out of Food in the Last Year: Not on file  Transportation Needs:   . Lack of Transportation (Medical): Not on file  . Lack of Transportation (Non-Medical): Not on file  Physical Activity:   . Days of Exercise per Week: Not on file  . Minutes of Exercise per Session: Not on file  Stress:   . Feeling of Stress : Not on file  Social Connections:   . Frequency of Communication with Friends and Family: Not on file  . Frequency of Social Gatherings with Friends and Family: Not on file  . Attends Religious Services: Not on file  . Active Member of Clubs or Organizations: Not on file  . Attends Archivist Meetings: Not on file  . Marital Status: Not on file  Intimate Partner Violence:   . Fear of Current or Ex-Partner: Not on file  . Emotionally Abused: Not on file  . Physically Abused: Not on file  . Sexually Abused: Not on file    FAMILY HISTORY: Family History  Problem Relation Age of Onset  . Breast cancer Mother   . Asthma Mother   . Congestive Heart Failure Mother   . Prostate cancer Father   . Brain cancer Father   . Bladder Cancer Father      ALLERGIES:  has No Known Allergies.  MEDICATIONS:  Current Outpatient Medications  Medication Sig Dispense Refill  . amLODipine (NORVASC) 5 MG tablet Take 5 mg by mouth daily.    Marland Kitchen dexamethasone (DECADRON) 1 MG tablet Take 1 mg by mouth 2 (two) times daily with a meal.    . DULoxetine (CYMBALTA) 30 MG capsule TAKE 1 CAPSULE BY MOUTH EVERY DAY 30 capsule 0  . lidocaine (XYLOCAINE) 2 % solution     . lidocaine-prilocaine (EMLA) cream Apply to affected area once 30 g 3  . Multiple Vitamins-Minerals (MULTIVITAMIN WITH MINERALS) tablet Take 1 tablet by mouth daily.    Marland Kitchen omeprazole (PRILOSEC) 20 MG capsule TAKE 1 CAPSULE BY MOUTH EVERY DAY (Patient taking differently: Take 20 mg by mouth daily as needed. ) 90 capsule 1  . ondansetron (ZOFRAN) 8 MG tablet Take 1 tablet (8 mg total) by  mouth 2 (two) times daily as needed for refractory nausea / vomiting. Start on day 3 after carboplatin chemo. 30 tablet 1  . oxyCODONE (ROXICODONE) 5 MG immediate release tablet Take 1 tablet (5 mg total) by mouth every 12 (twelve) hours as needed for moderate pain, severe pain or breakthrough pain. 30 tablet 0  . prochlorperazine (COMPAZINE) 10 MG tablet Take 1 tablet (10 mg total) by mouth every 6 (six) hours as needed (Nausea or vomiting). 30 tablet 1  . rosuvastatin (CRESTOR) 10 MG tablet Take 10 mg by mouth every evening.  4  . nystatin (MYCOSTATIN) 100000 UNIT/ML suspension Take 5 mLs (500,000 Units total) by mouth in the morning, at noon, and at bedtime. SWISH AND SPIT (Patient not taking: Reported on 09/07/2020) 473 mL 0   No current facility-administered medications for this visit.   Facility-Administered Medications Ordered in Other Visits  Medication Dose Route Frequency Provider Last Rate Last Admin  . sodium chloride flush (NS) 0.9 % injection 10 mL  10 mL Intravenous PRN Earlie Server, MD      . sodium chloride flush (NS) 0.9 % injection 10 mL  10 mL Intravenous PRN Earlie Server, MD   10 mL at 09/13/20 2725      PHYSICAL EXAMINATION: ECOG PERFORMANCE STATUS: 1 - Symptomatic but completely ambulatory Vitals:   09/13/20 0841  BP: 117/75  Pulse: (!) 113  Resp: 16  Temp: (!) 97.4 F (36.3 C)  SpO2: 97%   Physical Exam Constitutional:      General: He is not in acute distress.    Appearance: He is not diaphoretic.  HENT:     Head: Normocephalic and atraumatic.     Nose: Nose normal.     Mouth/Throat:     Pharynx: No oropharyngeal exudate.     Comments: Ritta Slot has resolved Eyes:     General: No scleral icterus.    Pupils: Pupils are equal, round, and reactive to light.  Cardiovascular:     Rate and Rhythm: Regular rhythm. Tachycardia present.     Heart sounds: No murmur heard.   Pulmonary:     Effort: Pulmonary effort is normal. No respiratory distress.     Breath sounds: No rales.  Chest:     Chest wall: No tenderness.  Abdominal:     General: There is no distension.     Palpations: Abdomen is soft.     Tenderness: There is no abdominal tenderness.  Musculoskeletal:        General: Normal range of motion.     Cervical back: Normal range of motion and neck supple.  Skin:    General: Skin is warm and dry.     Findings: No erythema.  Neurological:     Mental Status: He is alert and oriented to person, place, and time.     Cranial Nerves: No cranial nerve deficit.     Motor: No abnormal muscle tone.     Coordination: Coordination normal.  Psychiatric:        Mood and Affect: Affect normal.      LABORATORY DATA:  I have reviewed the data as listed Lab Results  Component Value Date   WBC 6.8 09/13/2020   HGB 10.6 (L) 09/13/2020   HCT 32.0 (L) 09/13/2020   MCV 90.9 09/13/2020   PLT 212 09/13/2020   Recent Labs    08/17/20 0804 08/17/20 0804 08/23/20 1414 08/23/20 1414 08/31/20 1244 09/07/20 0833 09/13/20 0833  NA 139   < > 137   < >  137 138 134*  K 4.0   < > 3.6   < > 4.5 3.9 3.9  CL 105   < > 100   < > 98 102 99  CO2 25   < > 26   < > $R'27 27 26   'qv$ GLUCOSE 101*   < > 129*   < > 134* 124* 135*  BUN 23   < > 21   < > 25* 14 17  CREATININE 1.52*   < > 1.50*   < > 1.64* 1.38* 1.30*  CALCIUM 9.2   < > 8.9   < > 9.3 9.3 9.2  GFRNONAA 47*   < > 48*   < > 43* 53* 57*  GFRAA 55*  --  56*  --  50*  --   --   PROT 7.4   < > 7.0   < > 7.3 7.3 6.7  ALBUMIN 3.5   < > 3.5   < > 3.5 3.4* 3.5  AST 19   < > 18   < > $R'20 20 21  'jA$ ALT 16   < > 16   < > $R'17 16 17  'Oe$ ALKPHOS 82   < > 93   < > 103 79 100  BILITOT 0.7   < > 0.8   < > 0.6 0.9 0.8   < > = values in this interval not displayed.  RADIOGRAPHIC STUDIES: I have personally reviewed the radiological images as listed and agreed with the findings in the report. No results found.   ASSESSMENT & PLAN:   1. 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[place])   2. Tachycardia   3. Stage 3a chronic kidney disease ([place])   4. Dysphagia, unspecified type   5. Normocytic anemia    #Metastatic squamous cell carcinoma of oropharynx- cervical lymphadenopathy, lung metastatic disease Patient tolerates carboplatin and docetaxel 1 week ago.  Labs are reviewed and discussed with patient. Patient has been set up to have simulation done later this week for palliative radiation. Also feeding tube placement by interventional radiology.  Patient also has second opinion with [facility] oncology for possible trials.  #Sinus tachycardia likely secondary to decreased oral intake. Patient will receive IV fluid today. #Chronic kidney disease, his creatinine is stable. #Normocytic anemia, likely secondary to chemotherapy.  Follow-up: 1 week Earlie Server, MD, PhD Hematology Oncology [place] at [facility] 09/13/2020

## 2020-09-15 ENCOUNTER — Other Ambulatory Visit: Payer: Self-pay

## 2020-09-15 ENCOUNTER — Ambulatory Visit
Admission: RE | Admit: 2020-09-15 | Discharge: 2020-09-15 | Disposition: A | Discharge status: Home / Self Care | Payer: Medicare Other | Source: Ambulatory Visit | Attending: Oncology | Admitting: Oncology

## 2020-09-15 DIAGNOSIS — C109 Malignant neoplasm of oropharynx, unspecified: Secondary | ICD-10-CM | POA: Diagnosis present

## 2020-09-15 IMAGING — CT CT ABDOMEN W/O CM
2 of 4 series · 15 of 46 positions shown, 17 images · non-contrast
Comparison: PET-CT [DATE]

CLINICAL DATA: 64-year-old with squamous cell carcinoma of the
oropharynx. Evaluate anatomy for gastrostomy tube placement.

EXAM:
CT ABDOMEN WITHOUT CONTRAST
TECHNIQUE: Multidetector CT imaging of the abdomen was performed following the
standard protocol without IV contrast.

[Series 2: routine abd/pel wo · axial · 0.78mm/px · z∈[-80,+150]mm · 12 of 52 slices shown, 14 images]
[im 3/52  soft-tissue]
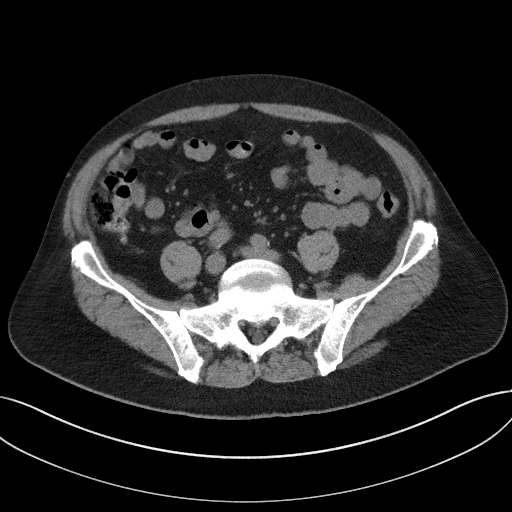
[im 3/52  bone]
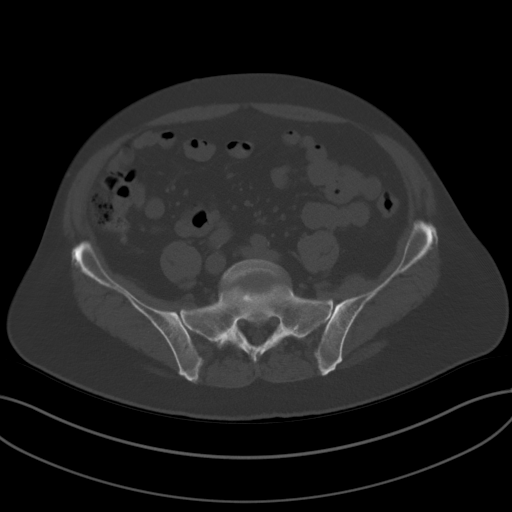
[im 7/52  soft-tissue]
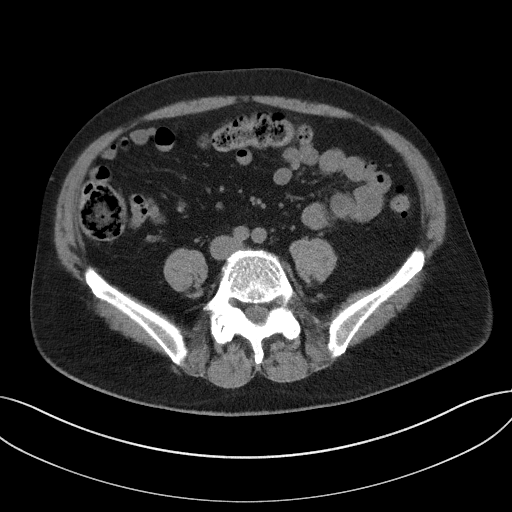
[im 12/52  soft-tissue]
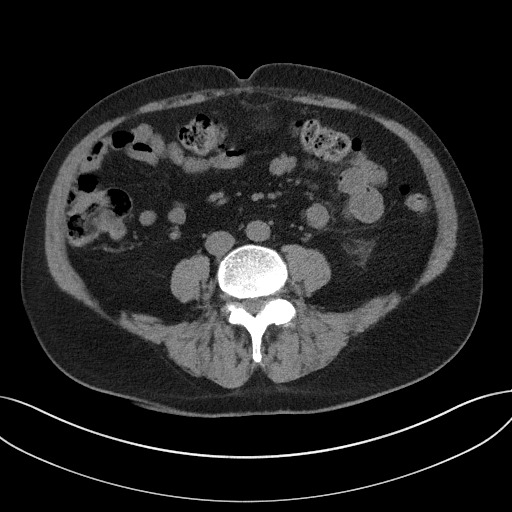
[im 17/52  soft-tissue]
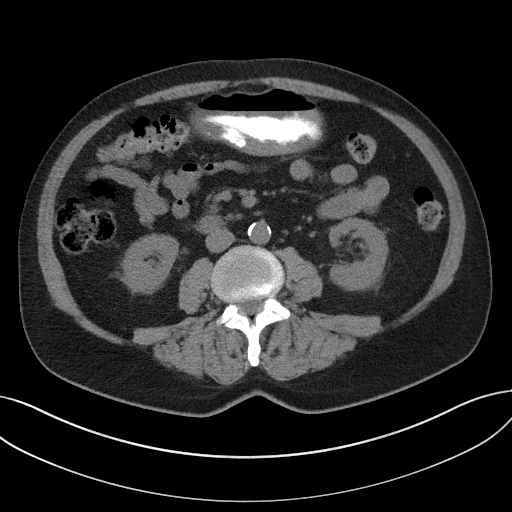
[im 19/52  soft-tissue]
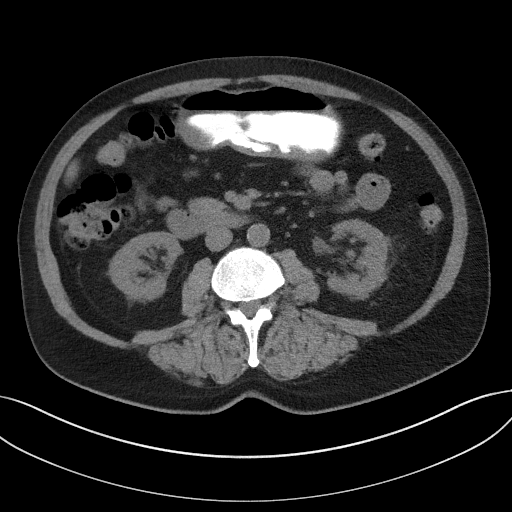
[im 24/52  soft-tissue]
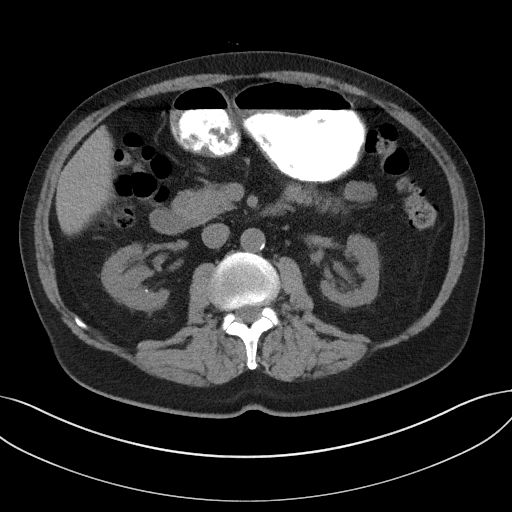
[im 28/52  soft-tissue]
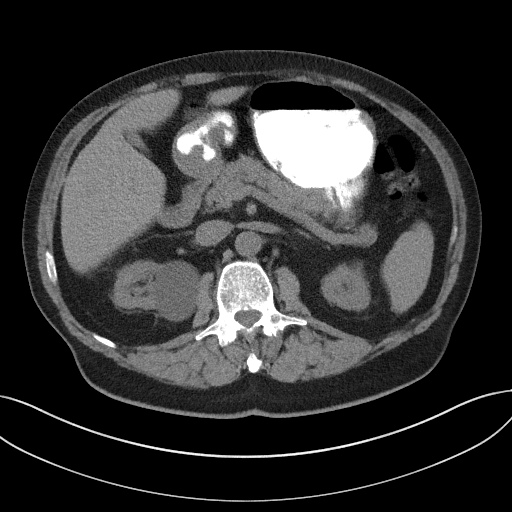
[im 33/52  soft-tissue]
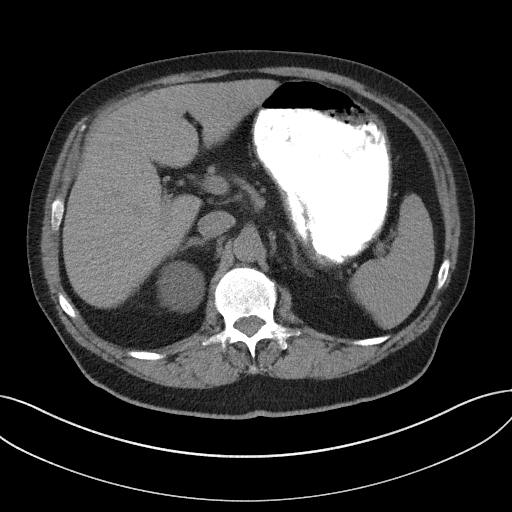
[im 35/52  soft-tissue]
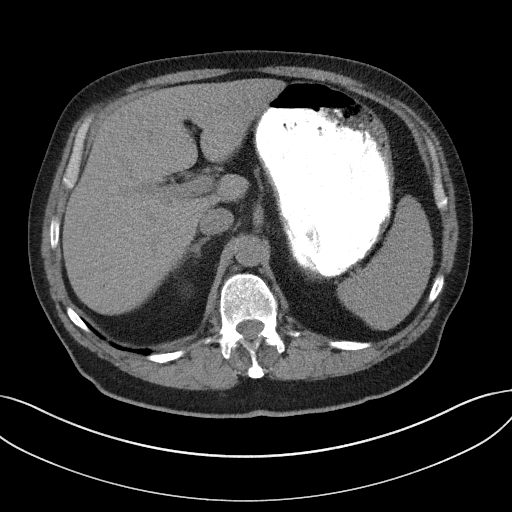
[im 35/52  bone]
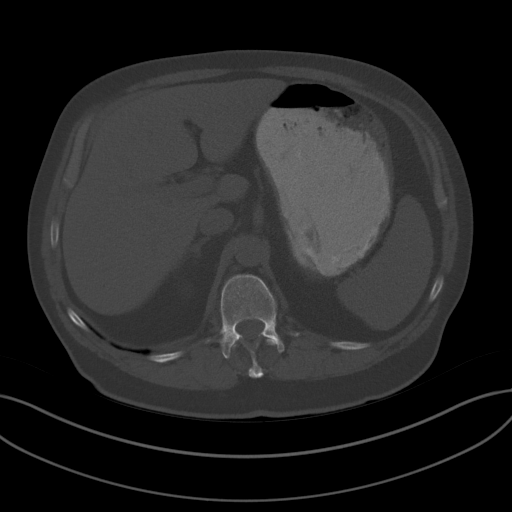
[im 40/52  soft-tissue]
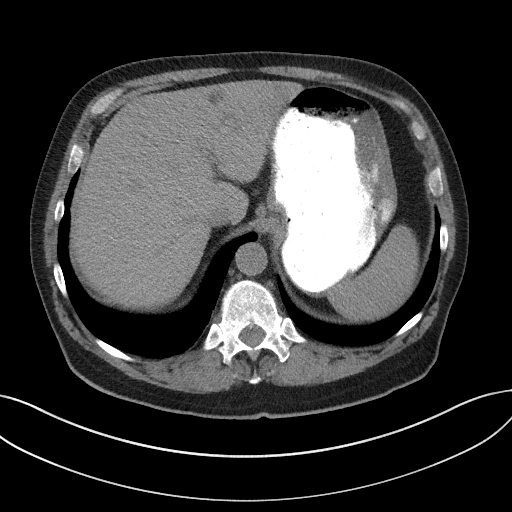
[im 45/52  soft-tissue]
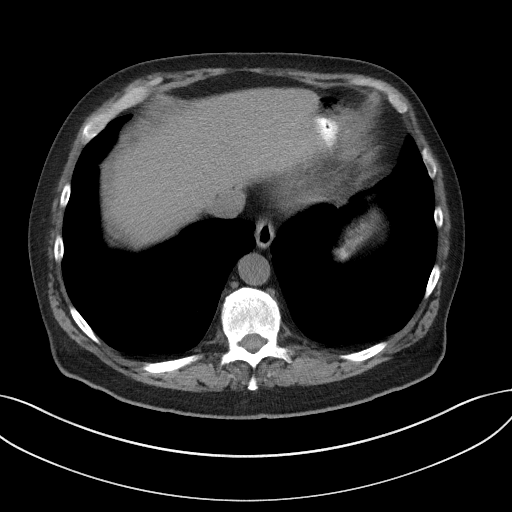
[im 49/52  soft-tissue]
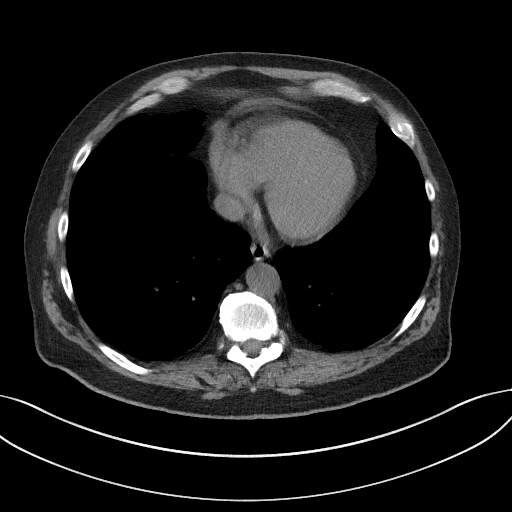

[Series 5: coronal st · coronal · 0.52mm/px · 3 of 100 slices shown]
[im 34/100  soft-tissue]
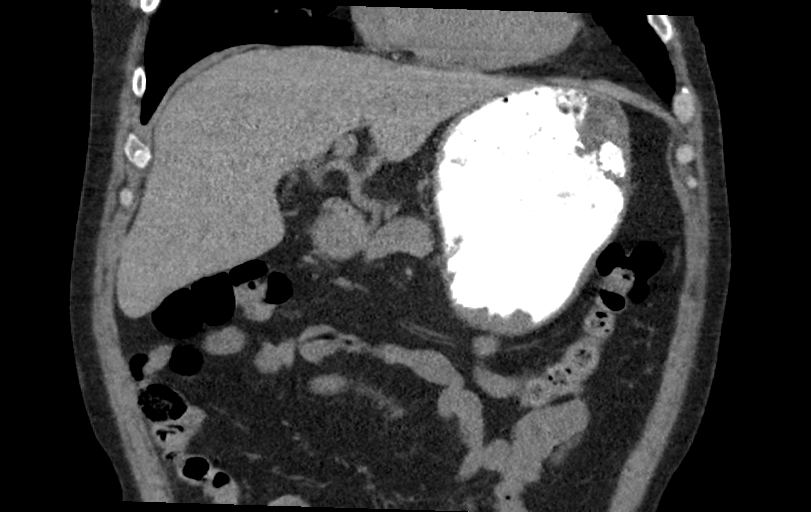
[im 45/100  soft-tissue]
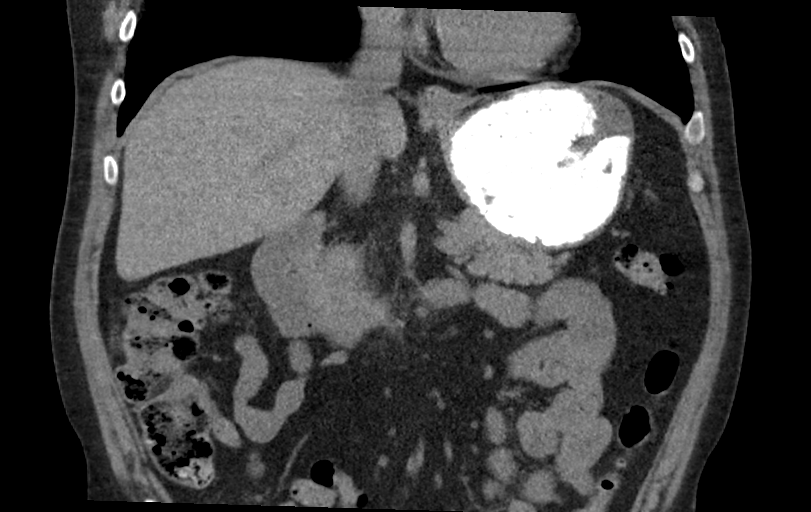
[im 56/100  soft-tissue]
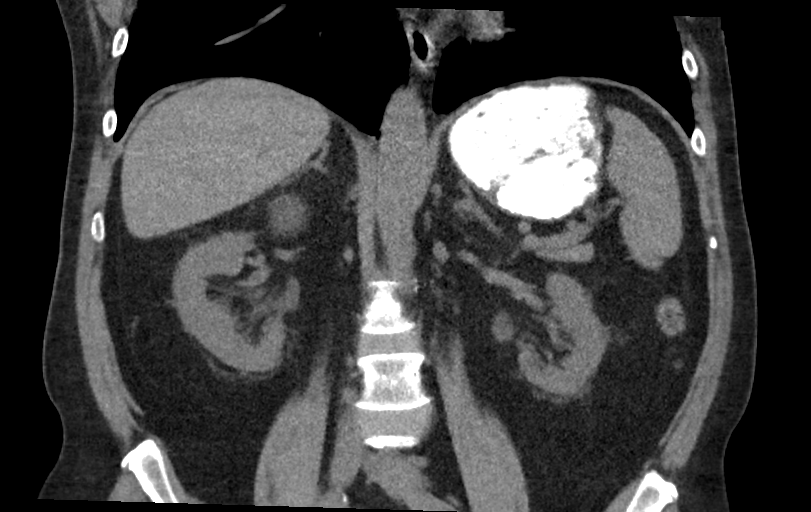

[15 of 46 positions shown; findings below may reference images not displayed]

FINDINGS: Lower chest: Few densities at the lung bases likely represent
atelectasis.

Hepatobiliary: 1.3 cm hypodensity in the left hepatic lobe likely
represents a cyst. Gallbladder is decompressed. No biliary
dilatation.

Pancreas: Unremarkable. No pancreatic ductal dilatation or
surrounding inflammatory changes.

Spleen: Normal in size without focal abnormality.

Adrenals/Urinary Tract: Normal appearance of the adrenal glands.
Large low-density cyst involving the right kidney upper pole
measures up to 5.0 cm. Tiny stone in the right kidney without
hydronephrosis. Probable left renal parapelvic cysts. No definite
left hydronephrosis.

Stomach/Bowel: Stomach is mildly distended with oral contrast.
Normal appearance of the stomach. There are no structures located
between the stomach in the anterior abdominal wall. Transverse colon
is caudal to the stomach. Small amount of high-density material
within the appendix without inflammatory changes. No evidence for
bowel obstruction.

Vascular/Lymphatic: Atherosclerotic calcifications in the abdominal
aorta without aneurysm. No significant lymph node enlargement in the
abdomen.

Other: Negative for ascites.  Negative for free air.

Musculoskeletal: Degenerative facet disease in lower lumbar spine.
IMPRESSION: 1. Anatomy is amendable for a percutaneous gastrostomy tube
placement.
2. Evidence for bilateral renal cysts.
3.  Aortic Atherosclerosis ([DS]-[DS]).
4. Tiny nonobstructive right kidney stone.

## 2020-09-16 ENCOUNTER — Ambulatory Visit: Admission: RE | Admit: 2020-09-16 | Payer: Medicare Other | Source: Ambulatory Visit

## 2020-09-16 ENCOUNTER — Inpatient Hospital Stay: Payer: Medicare Other

## 2020-09-16 ENCOUNTER — Other Ambulatory Visit: Payer: Self-pay | Admitting: Oncology

## 2020-09-16 VITALS — BP 127/81 | HR 88 | Temp 97.4°F | Resp 17

## 2020-09-16 DIAGNOSIS — C01 Malignant neoplasm of base of tongue: Secondary | ICD-10-CM | POA: Diagnosis present

## 2020-09-16 DIAGNOSIS — Z510 Encounter for antineoplastic radiation therapy: Secondary | ICD-10-CM | POA: Diagnosis present

## 2020-09-16 DIAGNOSIS — C109 Malignant neoplasm of oropharynx, unspecified: Secondary | ICD-10-CM

## 2020-09-16 DIAGNOSIS — Z5111 Encounter for antineoplastic chemotherapy: Secondary | ICD-10-CM | POA: Diagnosis not present

## 2020-09-16 MED ORDER — HEPARIN SOD (PORK) LOCK FLUSH 100 UNIT/ML IV SOLN
500.0000 [IU] | Freq: Once | INTRAVENOUS | Status: AC
  Administered 2020-09-16: 500 [IU] via INTRAVENOUS
  Filled 2020-09-16: qty 5

## 2020-09-16 MED ORDER — SODIUM CHLORIDE 0.9 % IV SOLN
Freq: Once | INTRAVENOUS | Status: AC
  Filled 2020-09-16: qty 250

## 2020-09-16 MED ORDER — SODIUM CHLORIDE 0.9% FLUSH
10.0000 mL | INTRAVENOUS | Status: DC | PRN
  Administered 2020-09-16: 10 mL via INTRAVENOUS
  Filled 2020-09-16: qty 10

## 2020-09-16 NOTE — Progress Notes (Signed)
Nutrition Follow-up:   Patient with recurrent metastatic squamous cell carcinoma, p 16 positive.  Patient currently receiving docetaxol.  Planning feeding tube placement on 10/15.  Patient to start radiation in about 2 weeks per patient.    Met with patient during IV fluids.  Patient reports that he is drinking about 4 ensure enlive per day and drinking liquids and soft foods.    Patient has been contacted by [facility], home health and will have supplies delivered tomorrow afternoon after feeding tube is placed.   Reports normal bowel movement this am.   Medications: reviewed  Labs: reviewed  Anthropometrics:   183 lb on 10/11  186 lb on 10/6 188 lb on 9/23  Estimated Energy Needs  Kcals: 2100-2500 Protein: 105-125 g Fluid: > 2.1 L  NUTRITION DIAGNOSIS: Inadequate oral intake continues    INTERVENTION:  Reviewed instructions for PEG care with patient today. Patient verbalized understanding.   Reviewed tube feeding regimen with patient and another handwritten copy given to patient.  Patient will start with 1/2 carton 5 times per day.  Will provide 60 ml water before and 120 ml after each feeding.  Patient will add 1/2 carton DAILY until goal rate of 5 full cartons of formula ([employer] 1.4) provided each day.  Patient verbalized understanding.  Patient will also need to drink at least 240ml of water or provided via tube for better hydration.   Patient has contact information    MONITORING, EVALUATION, GOAL: weight trends, intake, tube feeding tolerance   NEXT VISIT: October 18 phone f/u   B. , RD, LDN Registered Dietitian [phone] (mobile)     

## 2020-09-16 NOTE — Progress Notes (Signed)
Pt in [facility] for 1 liter of NS. Pt feels well today. Offers no complaints. Nutritionist in room teaching pt how to do bolus TF's as he is having G tube placement tomorrow.

## 2020-09-17 ENCOUNTER — Other Ambulatory Visit: Payer: Self-pay

## 2020-09-17 ENCOUNTER — Other Ambulatory Visit: Payer: Self-pay | Admitting: Radiology

## 2020-09-17 ENCOUNTER — Ambulatory Visit
Admission: RE | Admit: 2020-09-17 | Discharge: 2020-09-17 | Disposition: A | Discharge status: Home / Self Care | Payer: Medicare Other | Source: Ambulatory Visit | Attending: Oncology | Admitting: Oncology

## 2020-09-17 DIAGNOSIS — I10 Essential (primary) hypertension: Secondary | ICD-10-CM | POA: Diagnosis not present

## 2020-09-17 DIAGNOSIS — E785 Hyperlipidemia, unspecified: Secondary | ICD-10-CM | POA: Insufficient documentation

## 2020-09-17 DIAGNOSIS — R1310 Dysphagia, unspecified: Secondary | ICD-10-CM | POA: Diagnosis not present

## 2020-09-17 DIAGNOSIS — Z79899 Other long term (current) drug therapy: Secondary | ICD-10-CM | POA: Diagnosis not present

## 2020-09-17 DIAGNOSIS — C109 Malignant neoplasm of oropharynx, unspecified: Secondary | ICD-10-CM

## 2020-09-17 LAB — PROTIME-INR
INR: 0.9 (ref 0.8–1.2)
Prothrombin Time: 12 seconds (ref 11.4–15.2)

## 2020-09-17 LAB — CBC
HCT: 34 % — ABNORMAL LOW (ref 39.0–52.0)
Hemoglobin: 11 g/dL — ABNORMAL LOW (ref 13.0–17.0)
MCH: 30.1 pg (ref 26.0–34.0)
MCHC: 32.4 g/dL (ref 30.0–36.0)
MCV: 93.2 fL (ref 80.0–100.0)
Platelets: 219 10*3/uL (ref 150–400)
RBC: 3.65 MIL/uL — ABNORMAL LOW (ref 4.22–5.81)
RDW: 16.9 % — ABNORMAL HIGH (ref 11.5–15.5)
WBC: 17.1 10*3/uL — ABNORMAL HIGH (ref 4.0–10.5)
nRBC: 0.3 % — ABNORMAL HIGH (ref 0.0–0.2)

## 2020-09-17 MED ORDER — MIDAZOLAM HCL 2 MG/2ML IJ SOLN
INTRAMUSCULAR | Status: AC
  Filled 2020-09-17: qty 2

## 2020-09-17 MED ORDER — CEFAZOLIN SODIUM-DEXTROSE 2-4 GM/100ML-% IV SOLN
2.0000 g | Freq: Once | INTRAVENOUS | Status: DC
  Filled 2020-09-17: qty 100

## 2020-09-17 MED ORDER — SODIUM CHLORIDE 0.9 % IV SOLN
Freq: Once | INTRAVENOUS | Status: AC
  Administered 2020-09-17: 1000 mL via INTRAVENOUS

## 2020-09-17 MED ORDER — CEFAZOLIN SODIUM-DEXTROSE 2-4 GM/100ML-% IV SOLN
INTRAVENOUS | Status: AC
  Filled 2020-09-17: qty 100

## 2020-09-17 MED ORDER — FENTANYL CITRATE (PF) 100 MCG/2ML IJ SOLN
INTRAMUSCULAR | Status: AC
  Filled 2020-09-17: qty 2

## 2020-09-17 NOTE — H&P (Signed)
Chief Complaint: SCC of the oropharynx with dysphagia. Request is for gastrostomy tube placement  Referring Physician(s): Yu,Zhou  Supervising Physician: Markus Daft  Patient Status: [facility] - Out-pt  History of Present Illness: Ivan Pann. is a 64 y.o. male  History of HTN, HLD and metastatic SCC of oropharynx. Now with improving but persistent dysphagia. Team is requesting a gastrostomy tube placement for nutritional access during treatment. Patient denies any complaints at this time. Patient states that he anticipates resolution of his dysphagia with radiation. Patient made aware the gastrostomy tube will need to be in place for a minimum of 6 weeks. Patient is being followed by Nutrition.   Past Medical History:  Diagnosis Date  . Arthritis   . Cancer ([place])    Head and neck cancer  . Hemorrhoids   . Hyperlipidemia   . Hypertension     Past Surgical History:  Procedure Laterality Date  . COLONOSCOPY WITH PROPOFOL N/A 04/04/2017   Procedure: COLONOSCOPY WITH PROPOFOL;  Surgeon: Robert Bellow, MD;  Location: [facility];  Service: Endoscopy;  Laterality: N/A;  . MANDIBLE SURGERY    . PORT-A-CATH REMOVAL  07/17/2018  . PORTA CATH INSERTION N/A 12/19/2017   Procedure: PORTA CATH INSERTION;  Surgeon: Algernon Huxley, MD;  Location: [place];  Service: Cardiovascular;  Laterality: N/A;  . PORTA CATH INSERTION N/A 07/17/2018   Procedure: PORTA CATH INSERTION;  Surgeon: Algernon Huxley, MD;  Location: [place] [facility];  Service: Cardiovascular;  Laterality: N/A;  . PORTA CATH INSERTION N/A 09/18/2019   Procedure: PORTA CATH INSERTION;  Surgeon: Algernon Huxley, MD;  Location: [facility];  Service: Cardiovascular;  Laterality: N/A;  . TONSILLECTOMY      Allergies: Patient has no known allergies.  Medications: Prior to Admission medications   Medication Sig Start Date End Date Taking? Authorizing Provider  amLODipine (NORVASC) 5 MG tablet Take 5  mg by mouth daily.   Yes [provider]  dexamethasone (DECADRON) 1 MG tablet Take 1 mg by mouth 2 (two) times daily with a meal.   Yes [provider]  DULoxetine (CYMBALTA) 30 MG capsule TAKE 1 CAPSULE BY MOUTH EVERY DAY 09/02/20  Yes Earlie Server, MD  ondansetron (ZOFRAN) 8 MG tablet Take 1 tablet (8 mg total) by mouth 2 (two) times daily as needed for refractory nausea / vomiting. Start on day 3 after carboplatin chemo. 09/10/19  Yes Earlie Server, MD  oxyCODONE (ROXICODONE) 5 MG immediate release tablet Take 1 tablet (5 mg total) by mouth every 12 (twelve) hours as needed for moderate pain, severe pain or breakthrough pain. 08/17/20  Yes Earlie Server, MD  rosuvastatin (CRESTOR) 10 MG tablet Take 10 mg by mouth every evening. 01/26/17  Yes [provider]  lidocaine (XYLOCAINE) 2 % solution  11/17/19   [provider]  lidocaine-prilocaine (EMLA) cream Apply to affected area once 09/10/19   Earlie Server, MD  Multiple Vitamins-Minerals (MULTIVITAMIN WITH MINERALS) tablet Take 1 tablet by mouth daily.    [provider]  nystatin (MYCOSTATIN) 100000 UNIT/ML suspension Take 5 mLs (500,000 Units total) by mouth in the morning, at noon, and at bedtime. SWISH AND SPIT Patient not taking: Reported on 09/07/2020 08/17/20   Earlie Server, MD  omeprazole (PRILOSEC) 20 MG capsule TAKE 1 CAPSULE BY MOUTH EVERY DAY Patient taking differently: Take 20 mg by mouth daily as needed.  11/17/19   Earlie Server, MD  prochlorperazine (COMPAZINE) 10 MG tablet  Take 1 tablet (10 mg total) by mouth every 6 (six) hours as needed (Nausea or vomiting). 09/10/19   Earlie Server, MD     Family History  Problem Relation Age of Onset  . Breast cancer Mother   . Asthma Mother   . Congestive Heart Failure Mother   . Prostate cancer Father   . Brain cancer Father   . Bladder Cancer Father     Social History   Socioeconomic History  . Marital status: Single    Spouse name: Not on file  . Number of children: Not  on file  . Years of education: Not on file  . Highest education level: Not on file  Occupational History  . Not on file  Tobacco Use  . Smoking status: Never Smoker  . Smokeless tobacco: Never Used  Vaping Use  . Vaping Use: Never used  Substance and Sexual Activity  . Alcohol use: No  . Drug use: No  . Sexual activity: Not on file  Other Topics Concern  . Not on file  Social History Narrative  . Not on file   Social Determinants of Health   Financial Resource Strain:   . Difficulty of Paying Living Expenses: Not on file  Food Insecurity:   . Worried About Charity fundraiser in the Last Year: Not on file  . Ran Out of Food in the Last Year: Not on file  Transportation Needs:   . Lack of Transportation (Medical): Not on file  . Lack of Transportation (Non-Medical): Not on file  Physical Activity:   . Days of Exercise per Week: Not on file  . Minutes of Exercise per Session: Not on file  Stress:   . Feeling of Stress : Not on file  Social Connections:   . Frequency of Communication with Friends and Family: Not on file  . Frequency of Social Gatherings with Friends and Family: Not on file  . Attends Religious Services: Not on file  . Active Member of Clubs or Organizations: Not on file  . Attends Archivist Meetings: Not on file  . Marital Status: Not on file     Review of Systems: A 12 point ROS discussed and pertinent positives are indicated in the HPI above.  All other systems are negative.  Review of Systems  Constitutional: Negative for fever.  HENT: Negative for congestion.   Respiratory: Negative for cough and shortness of breath.   Cardiovascular: Negative for chest pain.  Gastrointestinal: Negative for abdominal pain.  Neurological: Negative for headaches.  Psychiatric/Behavioral: Negative for behavioral problems and confusion.    Vital Signs: BP 122/82   Pulse 98   Temp 98.3 F (36.8 C) (Oral)   Resp 20   Ht 5\' 10"  (1.778 m)   Wt 182 lb  (82.6 kg)   SpO2 96%   BMI 26.11 kg/m   Physical Exam Vitals and nursing note reviewed.  Constitutional:      Appearance: He is well-developed.  HENT:     Head: Normocephalic.  Cardiovascular:     Rate and Rhythm: Normal rate and regular rhythm.     Heart sounds: Normal heart sounds.  Pulmonary:     Effort: Pulmonary effort is normal.     Breath sounds: Normal breath sounds.  Musculoskeletal:        General: Normal range of motion.     Cervical back: Normal range of motion.  Skin:    General: Skin is dry.  Neurological:  Mental Status: He is alert and oriented to person, place, and time.     Imaging: CT Abdomen Wo Contrast  Result Date: 09/16/2020 CLINICAL DATA:  64 year old with squamous cell carcinoma of the oropharynx. Evaluate anatomy for gastrostomy tube placement. EXAM: CT ABDOMEN WITHOUT CONTRAST TECHNIQUE: Multidetector CT imaging of the abdomen was performed following the standard protocol without IV contrast. COMPARISON:  PET-CT 04/26/2020 FINDINGS: Lower chest: Few densities at the lung bases likely represent atelectasis. Hepatobiliary: 1.3 cm hypodensity in the left hepatic lobe likely represents a cyst. Gallbladder is decompressed. No biliary dilatation. Pancreas: Unremarkable. No pancreatic ductal dilatation or surrounding inflammatory changes. Spleen: Normal in size without focal abnormality. Adrenals/Urinary Tract: Normal appearance of the adrenal glands. Large low-density cyst involving the right kidney upper pole measures up to 5.0 cm. Tiny stone in the right kidney without hydronephrosis. Probable left renal parapelvic cysts. No definite left hydronephrosis. Stomach/Bowel: Stomach is mildly distended with oral contrast. Normal appearance of the stomach. There are no structures located between the stomach in the anterior abdominal wall. Transverse colon is caudal to the stomach. Small amount of high-density material within the appendix without inflammatory changes.  No evidence for bowel obstruction. Vascular/Lymphatic: Atherosclerotic calcifications in the abdominal aorta without aneurysm. No significant lymph node enlargement in the abdomen. Other: Negative for ascites.  Negative for free air. Musculoskeletal: Degenerative facet disease in lower lumbar spine. IMPRESSION: 1. Anatomy is amendable for a percutaneous gastrostomy tube placement. 2. Evidence for bilateral renal cysts. 3.  Aortic Atherosclerosis (ICD10-I70.0). 4. Tiny nonobstructive right kidney stone. Electronically Signed   By: Markus Daft M.D.   On: 09/16/2020 15:07    Labs:  CBC: Recent Labs    08/31/20 1244 09/07/20 0833 09/13/20 0833 09/17/20 0959  WBC 14.2* 9.0 6.8 17.1*  HGB 12.6* 11.4* 10.6* 11.0*  HCT 37.2* 34.6* 32.0* 34.0*  PLT 230 421* 212 219    COAGS: Recent Labs    09/17/20 0959  INR 0.9    BMP: Recent Labs    07/27/20 0814 07/27/20 0814 08/17/20 0804 08/17/20 0804 08/23/20 1414 08/31/20 1244 09/07/20 0833 09/13/20 0833  NA 137   < > 139   < > 137 137 138 134*  K 3.8   < > 4.0   < > 3.6 4.5 3.9 3.9  CL 103   < > 105   < > 100 98 102 99  CO2 27   < > 25   < > 26 27 27 26   GLUCOSE 101*   < > 101*   < > 129* 134* 124* 135*  BUN 20   < > 23   < > 21 25* 14 17  CALCIUM 9.0   < > 9.2   < > 8.9 9.3 9.3 9.2  CREATININE 1.34*   < > 1.52*   < > 1.50* 1.64* 1.38* 1.30*  GFRNONAA 55*   < > 47*   < > 48* 43* 53* 57*  GFRAA >60  --  55*  --  56* 50*  --   --    < > = values in this interval not displayed.    LIVER FUNCTION TESTS: Recent Labs    08/23/20 1414 08/31/20 1244 09/07/20 0833 09/13/20 0833  BILITOT 0.8 0.6 0.9 0.8  AST 18 20 20 21   ALT 16 17 16 17   ALKPHOS 93 103 79 100  PROT 7.0 7.3 7.3 6.7  ALBUMIN 3.5 3.5 3.4* 3.5    Assessment and Plan:  64 y.o. male  outpatient. History of HTN, HLD and metastatic SCC of oropharynx. Now with improving but persistent dysphagia. Team is requesting a gastrostomy tube placement for nutritional access during  treatment  CT  Abdomen from 10.13.21 shows the stomach is an accessible location. WBC is 17.1 (patient is on decadron). All other labs and medications are within acceptable parameters. NKDA. Patient has been NPO since midnight .  Risks and benefits image guided gastrostomy tube placement was discussed with the patient including, but not limited to the need for a barium enema during the procedure, bleeding, infection, peritonitis and/or damage to adjacent structures.  All of the patient's questions were answered, patient is agreeable to proceed.  Consent signed and in chart.  Patient will be prescribed pain medication for moderate to severe post procedure related pain.   Thank you for this interesting consult.  I greatly enjoyed meeting [employer]. and look forward to participating in their care.  A copy of this report was sent to the requesting provider on this date.  Electronically Signed: Jacqualine Mau, NP 09/17/2020, 10:49 AM   I spent a total of  30 Minutes   in face to face in clinical consultation, greater than 50% of which was counseling/coordinating care for gastrostomy tube placement

## 2020-09-17 NOTE — Telephone Encounter (Signed)
Received Ivan Garcia from Ivan Nyhan, NP that pt did get PEG tube placement to not having COVID test done. Procedure was rescheduled to 10/22. Per Ivan Garcia, since he did not get PEG tube placement, cancel lab on 10/19 and 10/21. Spoke to patient who states that he has been made aware of rescheduled date and that he will have covid test on Wednesday 10/20. Pt notified that he will only come in for IVF on 10/19 & 10/21. He voiced understanding and we reviewed appts in details.

## 2020-09-17 NOTE — Progress Notes (Signed)
Patient noted not to have a current COVID test. Due the aerosolizing nature of the procedure. Patient will be rescheduled for gastrostomy tube placement with request for COVID test to be performed within 3 to 5 days of planned rescheduled procedure.   Schedulers will notify patient and Team of new procedural date.   Team instructed to: Keep Patient to be NPO after midnight the day of the [place] test for 3 to 5 days prior.   This was communicate to Team.    .

## 2020-09-17 NOTE — Progress Notes (Signed)
Procedure cancelled due to patient not having covid test. Is rescheduled for next week.

## 2020-09-21 ENCOUNTER — Ambulatory Visit: Payer: Medicare Other

## 2020-09-21 ENCOUNTER — Inpatient Hospital Stay: Payer: Medicare Other

## 2020-09-21 ENCOUNTER — Other Ambulatory Visit: Payer: Self-pay

## 2020-09-21 ENCOUNTER — Inpatient Hospital Stay (HOSPITAL_BASED_OUTPATIENT_CLINIC_OR_DEPARTMENT_OTHER): Payer: Medicare Other | Admitting: Oncology

## 2020-09-21 ENCOUNTER — Encounter: Payer: Self-pay | Admitting: Oncology

## 2020-09-21 VITALS — BP 130/87 | HR 118 | Temp 97.2°F | Resp 18 | Wt 183.2 lb

## 2020-09-21 VITALS — BP 119/77 | HR 100 | Temp 97.5°F | Resp 20

## 2020-09-21 DIAGNOSIS — C109 Malignant neoplasm of oropharynx, unspecified: Secondary | ICD-10-CM | POA: Diagnosis not present

## 2020-09-21 DIAGNOSIS — Z5111 Encounter for antineoplastic chemotherapy: Secondary | ICD-10-CM | POA: Diagnosis not present

## 2020-09-21 DIAGNOSIS — Z23 Encounter for immunization: Secondary | ICD-10-CM

## 2020-09-21 DIAGNOSIS — R1310 Dysphagia, unspecified: Secondary | ICD-10-CM | POA: Diagnosis not present

## 2020-09-21 DIAGNOSIS — R000 Tachycardia, unspecified: Secondary | ICD-10-CM | POA: Diagnosis not present

## 2020-09-21 MED ORDER — SODIUM CHLORIDE 0.9 % IV SOLN
Freq: Once | INTRAVENOUS | Status: AC
  Administered 2020-09-21: 1000 mL via INTRAVENOUS
  Filled 2020-09-21: qty 250

## 2020-09-21 MED ORDER — INFLUENZA VAC A&B SA ADJ QUAD 0.5 ML IM PRSY
0.5000 mL | PREFILLED_SYRINGE | Freq: Once | INTRAMUSCULAR | Status: AC
  Administered 2020-09-21: 0.5 mL via INTRAMUSCULAR
  Filled 2020-09-21: qty 0.5

## 2020-09-21 MED ORDER — HEPARIN SOD (PORK) LOCK FLUSH 100 UNIT/ML IV SOLN
INTRAVENOUS | Status: AC
  Filled 2020-09-21: qty 5

## 2020-09-21 MED ORDER — HEPARIN SOD (PORK) LOCK FLUSH 100 UNIT/ML IV SOLN
500.0000 [IU] | Freq: Once | INTRAVENOUS | Status: AC
  Administered 2020-09-21: 500 [IU] via INTRAVENOUS
  Filled 2020-09-21: qty 5

## 2020-09-21 MED ORDER — MORPHINE SULFATE (CONCENTRATE) 20 MG/ML PO SOLN: 20.0000 mg | ORAL | 0 refills | Status: DC | PRN

## 2020-09-21 MED ORDER — SODIUM CHLORIDE 0.9% FLUSH
10.0000 mL | INTRAVENOUS | Status: DC | PRN
  Administered 2020-09-21: 10 mL via INTRAVENOUS
  Filled 2020-09-21: qty 10

## 2020-09-21 NOTE — Progress Notes (Signed)
Pulse Rate: 108, prior to 0.9% Sodium Chloride infusion. Patient reports increased pain, level 8 today on a 0-10 pain scale. He describes the pain as aching and pressure to his throat and upper, mid chest area and states, "it's from my tumor." Patient also reports an increase in difficulty swallowing over the last week. He reports feeding tube was not placed as scheduled last Friday, 09/17/2020, and the procedure has been rescheduled for this Friday, 09/24/2020. Patient is also requesting a refill for pain medication for home. MD, Dr. Tasia Catchings, notified and aware. MD is adding patient on to be seen in office today.

## 2020-09-21 NOTE — Addendum Note (Signed)
Addended by: Vanice Sarah on: 09/21/2020 04:10 PM   Modules accepted: Orders

## 2020-09-21 NOTE — Progress Notes (Signed)
Per [place] message from [place], Infusion nurse: Dr. Tasia Catchings, patient here for IVF today-currently infusing. HR was slightly elevated, 108 prior to starting IVF. He reports increased pain, level 8 today (aching and pressure to his throat and chest- states, "from my tumor"), and difficulty swallowing. He reports that feeding tube was not placed last Friday and has been rescheduled for this Friday  Pt reports that pain just started yesterday and feels like his throat is closing up. Pt requesting something for pain

## 2020-09-21 NOTE — Progress Notes (Signed)
Hematology/Oncology  Follow Up note [facility] Telephone:(336) [phone] Fax:(336) [phone]   Patient Care Team: Albina Billet, MD as PCP - General (Internal Medicine) Albina Billet, MD (Internal Medicine) Bary Castilla, Forest Gleason, MD (General Surgery) Noreene Filbert, MD as Referring Physician (Radiation Oncology) Earlie Server, MD as Consulting Physician (Oncology)  CHIEF COMPLAINTS/PURPOSE OF CONSULTATION:  Follow up for head and neck cancer.  HISTORY OF PRESENTING ILLNESS:  Ivan Garcia. is a  64 y.o.  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 P16 positive. cT2 cN2 disease, stage II, # Concurrent ChemoRT  Cisplatin 100 mg/m2 q3weeks with concurrent RT which started on 12/31/2017.  S/p one dose of Cisplatin. Cisplatin discontinued due to nephrotoxicity. Switch to weekly Carboplatin (AUC 2) / Taxol 43m/m2 [ finished May 2019] Patient has finished concurrent chemoradiation in May 2019  # He reports doing well until in July 2020, he started to have hoarseness after singing for 2 to 3 hours. He has changed his insurance and Dr. MTami Ribasis no longer covered by his current insurance.  So he establish care [facility] [place] [place] [facility] [place] [facility]. Flexible laryngoscopy showed Vocal cord paralysis.  Patient was recommended to have a PET scan done for restaging. Patient's primary care provider Dr. THall Busingordered a PET scan for patient which was done on 08/20/2019. 08/20/2019 PET scan showed asymmetric hypermetabolic him involving the left vocal cords with hypermetabolic adenopathy in the neck, and the chest.  He also has hypermetabolic pulmonary nodule along the minor fissure which is felt to be metastatic. Tiny proximal left ureteral stone without hydronephrosis.  Possible gallbladder sludge.  Enlarged prostate.   Atherosclerosis.  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  # NGS showed PD-L1 CPS 20, TMB indeterminant, MSI stable,PIK3CA mutation Started on chemotherapy with 6 cycles of carboplatin, 5-FU and Keytruda, finished in February 2021.  And has been on Keytruda maintenance. Initially he had a good response, however progressed whiled on Keytruda maintenance.  PET 04/26/2020 worsening of disease in the chest.  Interval enlargement of the right thoracic inlet and paratracheal lymph nodes that were previously enlarged with new activity in the right hilum corresponding to a lymph node in this location.  Also increased activity in the right upper lobe nodule. Right level 3 lymph node in the neck and anterior mediastinal nodal activity is diminished.  These areas are quite small on prior study. Diminished symmetric uptake within the left as compared to the right vocal cord. Right mandible increased activity in the area of third molar.  This is consistent with his recent tooth infection.  [facility].  Patient was offered for clinical trial with pembrolizumab/lenvatinib versus standard therapy. Patient received additional 1 cycle of Keytruda while waiting to be enrolled to the trial.   #Disease progression.  [facility] right upper paratracheal node is concerning for metastatic disease and a new from 01/20/2020 PET/CT.  The node closely abuts and possibly invade the posterolateral right tracheal wall as well as the esophagus.  Right vocal cord paralysis.  Symmetric soft tissue density at the right base of tongue could reflect residual/recurrent primary malignancy versus posttreatment changes.  Right supraclavicular lymph node is no longer appreciated with post treatment  changes.  There is increased size of right minor fissure nodule which was metabolically avid on previous PET scan.  New mediastinal and hilar lymphadenopathy concerning for new nodal disease.  No evidence of metastatic disease below the diaphragm.  Nonobstructing 3 mm distal ureter stone.  07/07/2020 started on palliative chemotherapy with docetaxel 09/02/2020 3 cycles of docetaxel  with G-CSF support.    CT scan done at [facility] which unfortunately showed progression of disease. Images are not available for me to review.  CT was compared to June 24, 2020 CT done at [facility]. 09/02/2020,chest without contrast with 3D MIPS protocol showed Interval increase in the size of the right upper lobe perifissural nodule 1.6 x 1.5 cm-previously 1.4 x 1.2 cm tracheoesophageal soft tissue mass 3.4 x 5.0 cm with regional mass-effect on the esophagus-previously 2.4 x 3.6 cm, and mediastinal lymphadenopathy-right upper paratracheal lymph node 1.9 cm-previously 1.5 cm, suspicious for progressive metastatic disease. Subcentimeter lung nodules are stable. CT neck with contrast showed slight interval increase in size of upper right paratracheal nodal conglomerate.  Questionable soft tissue invasion of the posterior tracheal appears worsened.  Compression with potential invasion of the esophagus is again noted.  Prior vocal cord paralysis again noted.  Unchanged right tongue base asymmetry. I had a discussion with patient's [facility] oncologist Dr. Barrington Ellison over the phone. Dr.Choe recommends adding carboplatin AUC 5 to docetaxel regimen for now which hopefully to help to reduce the size of the tracheoesophageal soft tissue mass.  Also recommend palliative radiation.   INTERVAL HISTORY Ivan Garcia Ivan Sandt. is a 64 y.o. male who presents for follow-up for metastatic squamous cell carcinoma, P 16+.   Patient is status post carboplatin and docetaxel 2 weeks ago.  Patient is here for IV hydration session and request acute visit for further  evaluation of pain. Patient reports that his dysphagia improved for about 5 days after chemotherapy and then got worse again.  Pain is 8 out of 10. He requests refill of pain medication. There was plan for him to get feeding tube placement however due to lack of COVID-19 testing, procedure was aborted and rescheduled to this Friday.  Radiation has been rescheduled as well.   Review of Systems  Constitutional: Negative for chills, fever, malaise/fatigue and weight loss.  HENT: Negative for sore throat.        Odynophagia  Eyes: Negative for redness.  Respiratory: Negative for cough, shortness of breath and wheezing.   Cardiovascular: Negative for chest pain, palpitations and leg swelling.  Gastrointestinal: Negative for abdominal pain, blood in stool, nausea and vomiting.  Genitourinary: Negative for dysuria.  Musculoskeletal: Negative for myalgias.  Skin: Negative for rash.  Neurological: Negative for dizziness, tingling and tremors.  Endo/Heme/Allergies: Does not bruise/bleed easily.  Psychiatric/Behavioral: Negative for hallucinations.    MEDICAL HISTORY:  Past Medical History:  Diagnosis Date  . Arthritis   . Cancer ([place])    Head and neck cancer  . Hemorrhoids   . Hyperlipidemia   . Hypertension     SURGICAL HISTORY: Past Surgical History:  Procedure Laterality Date  . COLONOSCOPY WITH PROPOFOL N/A 04/04/2017   Procedure: COLONOSCOPY WITH PROPOFOL;  Surgeon: Robert Bellow, MD;  [facility] END[facility]COPY;  Service: Endoscopy;  Laterality: N/A;  . MANDIBLE SURGERY    . PORT-A-CATH REMOVAL  07/17/2018  . PORTA CATH INSERTION N/A 12/19/2017   Procedure: PORTA CATH INSERTION;  Surgeon: Algernon Huxley, MD;  Location:  [place] CV LAB;  Service: Cardiovascular;  Laterality: N/A;  . PORTA CATH INSERTION N/A 07/17/2018   Procedure: PORTA CATH INSERTION;  Surgeon: Algernon Huxley, MD;  Location: [place] [facility] LAB;  Service: Cardiovascular;  Laterality: N/A;  . PORTA CATH  INSERTION N/A 09/18/2019   Procedure: PORTA CATH INSERTION;  Surgeon: Algernon Huxley, MD;  Location: [place] CV LAB;  Service: Cardiovascular;  Laterality: N/A;  . TONSILLECTOMY      SOCIAL HISTORY: Social History   Socioeconomic History  . Marital status: Single    Spouse name: Not on file  . Number of children: Not on file  . Years of education: Not on file  . Highest education level: Not on file  Occupational History  . Not on file  Tobacco Use  . Smoking status: Never Smoker  . Smokeless tobacco: Never Used  Vaping Use  . Vaping Use: Never used  Substance and Sexual Activity  . Alcohol use: No  . Drug use: No  . Sexual activity: Not on file  Other Topics Concern  . Not on file  Social History Narrative  . Not on file   Social Determinants of Health   Financial Resource Strain:   . Difficulty of Paying Living Expenses: Not on file  Food Insecurity:   . Worried About Charity fundraiser in the Last Year: Not on file  . Ran Out of Food in the Last Year: Not on file  Transportation Needs:   . Lack of Transportation (Medical): Not on file  . Lack of Transportation (Non-Medical): Not on file  Physical Activity:   . Days of Exercise per Week: Not on file  . Minutes of Exercise per Session: Not on file  Stress:   . Feeling of Stress : Not on file  Social Connections:   . Frequency of Communication with Friends and Family: Not on file  . Frequency of Social Gatherings with Friends and Family: Not on file  . Attends Religious Services: Not on file  . Active Member of Clubs or Organizations: Not on file  . Attends Archivist Meetings: Not on file  . Marital Status: Not on file  Intimate Partner Violence:   . Fear of Current or Ex-Partner: Not on file  . Emotionally Abused: Not on file  . Physically Abused: Not on file  . Sexually Abused: Not on file    FAMILY HISTORY: Family History  Problem Relation Age of Onset  . Breast cancer Mother   . Asthma  Mother   . Congestive Heart Failure Mother   . Prostate cancer Father   . Brain cancer Father   . Bladder Cancer Father     ALLERGIES:  has No Known Allergies.  MEDICATIONS:  Current Outpatient Medications  Medication Sig Dispense Refill  . amLODipine (NORVASC) 5 MG tablet Take 5 mg by mouth daily.    Marland Kitchen dexamethasone (DECADRON) 1 MG tablet Take 1 mg by mouth 2 (two) times daily with a meal.    . DULoxetine (CYMBALTA) 30 MG capsule TAKE 1 CAPSULE BY MOUTH EVERY DAY 30 capsule 0  . nystatin (MYCOSTATIN) 100000 UNIT/ML suspension Take 5 mLs (500,000 Units total) by mouth in the morning, at noon, and at bedtime. SWISH AND SPIT 473 mL 0  . omeprazole (PRILOSEC) 20 MG capsule TAKE 1 CAPSULE BY MOUTH EVERY DAY (Patient taking differently: Take 20 mg by mouth daily as needed. ) 90 capsule 1  . ondansetron (ZOFRAN) 8 MG tablet Take  1 tablet (8 mg total) by mouth 2 (two) times daily as needed for refractory nausea / vomiting. Start on day 3 after carboplatin chemo. 30 tablet 1  . prochlorperazine (COMPAZINE) 10 MG tablet Take 1 tablet (10 mg total) by mouth every 6 (six) hours as needed (Nausea or vomiting). 30 tablet 1  . rosuvastatin (CRESTOR) 10 MG tablet Take 10 mg by mouth every evening.  4  . lidocaine (XYLOCAINE) 2 % solution  (Patient not taking: Reported on 09/21/2020)    . lidocaine-prilocaine (EMLA) cream Apply to affected area once (Patient not taking: Reported on 09/21/2020) 30 g 3  . morphine (ROXANOL) 20 MG/ML concentrated solution Take 1 mL (20 mg total) by mouth every 4 (four) hours as needed for moderate pain or severe pain. 30 mL 0  . Multiple Vitamins-Minerals (MULTIVITAMIN WITH MINERALS) tablet Take 1 tablet by mouth daily. (Patient not taking: Reported on 09/21/2020)     No current facility-administered medications for this visit.   Facility-Administered Medications Ordered in Other Visits  Medication Dose Route Frequency Provider Last Rate Last Admin  . sodium chloride flush  (NS) 0.9 % injection 10 mL  10 mL Intravenous PRN Earlie Server, MD         PHYSICAL EXAMINATION: ECOG PERFORMANCE STATUS: 1 - Symptomatic but completely ambulatory Vitals:   09/21/20 1510  BP: 130/87  Pulse: (!) 118  Resp: 18  Temp: (!) 97.2 F (36.2 C)   Physical Exam Constitutional:      General: He is not in acute distress.    Appearance: He is not diaphoretic.  HENT:     Head: Normocephalic and atraumatic.     Nose: Nose normal.     Mouth/Throat:     Pharynx: No oropharyngeal exudate.     Comments: Ritta Slot has resolved Eyes:     General: No scleral icterus.    Pupils: Pupils are equal, round, and reactive to light.  Cardiovascular:     Rate and Rhythm: Regular rhythm. Tachycardia present.     Heart sounds: No murmur heard.   Pulmonary:     Effort: Pulmonary effort is normal. No respiratory distress.     Breath sounds: No rales.  Chest:     Chest wall: No tenderness.  Abdominal:     General: There is no distension.     Palpations: Abdomen is soft.     Tenderness: There is no abdominal tenderness.  Musculoskeletal:        General: Normal range of motion.     Cervical back: Normal range of motion and neck supple.  Skin:    General: Skin is warm and dry.     Findings: No erythema.  Neurological:     Mental Status: He is alert and oriented to person, place, and time.     Cranial Nerves: No cranial nerve deficit.     Motor: No abnormal muscle tone.     Coordination: Coordination normal.  Psychiatric:        Mood and Affect: Affect normal.      LABORATORY DATA:  I have reviewed the data as listed Lab Results  Component Value Date   WBC 17.1 (H) 09/17/2020   HGB 11.0 (L) 09/17/2020   HCT 34.0 (L) 09/17/2020   MCV 93.2 09/17/2020   PLT 219 09/17/2020   Recent Labs    08/17/20 0804 08/17/20 0804 08/23/20 1414 08/23/20 1414 08/31/20 1244 09/07/20 0833 09/13/20 0833  NA 139   < > 137   < >  137 138 134*  K 4.0   < > 3.6   < > 4.5 3.9 3.9  CL 105   < >  100   < > 98 102 99  CO2 25   < > 26   < > _0 GLUCOSE 101*   < > 129*   < > 134* 124* 135*  BUN 23   < > 21   < > 25* 14 17  CREATININE 1.52*   < > 1.50*   < > 1.64* 1.38* 1.30*  CALCIUM 9.2   < > 8.9   < > 9.3 9.3 9.2  GFRNONAA 47*   < > 48*   < > 43* 53* 57*  GFRAA 55*  --  56*  --  50*  --   --   PROT 7.4   < > 7.0   < > 7.3 7.3 6.7  ALBUMIN 3.5   < > 3.5   < > 3.5 3.4* 3.5  AST 19   < > 18   < > _1 ALT 16   < > 16   < > _2 ALKPHOS 82   < > 93   < > 103 79 100  BILITOT 0.7   < > 0.8   < > 0.6 0.9 0.8   < > = values in this interval not displayed.  RADIOGRAPHIC STUDIES: I have personally reviewed the radiological images as listed and agreed with the findings in the report. CT Abdomen Wo Contrast  Result Date: 09/16/2020 CLINICAL DATA:  64 year old with squamous cell carcinoma of the oropharynx. Evaluate anatomy for gastrostomy tube placement. EXAM: CT ABDOMEN WITHOUT CONTRAST TECHNIQUE: Multidetector CT imaging of the abdomen was performed following the standard protocol without IV contrast. COMPARISON:  PET-CT 04/26/2020 FINDINGS: Lower chest: Few densities at the lung bases likely represent atelectasis. Hepatobiliary: 1.3 cm hypodensity in the left hepatic lobe likely represents a cyst. Gallbladder is decompressed. No biliary dilatation. Pancreas: Unremarkable. No pancreatic ductal dilatation or surrounding inflammatory changes. Spleen: Normal in size without focal abnormality. Adrenals/Urinary Tract: Normal appearance of the adrenal glands. Large low-density cyst involving the right kidney upper pole measures up to 5.0 cm. Tiny stone in the right kidney without hydronephrosis. Probable left renal parapelvic cysts. No definite left hydronephrosis. Stomach/Bowel: Stomach is mildly distended with oral contrast. Normal appearance of the stomach. There are no structures located between the stomach in the anterior abdominal wall. Transverse colon is caudal to the stomach.  Small amount of high-density material within the appendix without inflammatory changes. No evidence for bowel obstruction. Vascular/Lymphatic: Atherosclerotic calcifications in the abdominal aorta without aneurysm. No significant lymph node enlargement in the abdomen. Other: Negative for ascites.  Negative for free air. Musculoskeletal: Degenerative facet disease in lower lumbar spine. IMPRESSION: 1. Anatomy is amendable for a percutaneous gastrostomy tube placement. 2. Evidence for bilateral renal cysts. 3.  Aortic Atherosclerosis (ICD10-I70.0). 4. Tiny nonobstructive right kidney stone. Electronically Signed   By: Markus Daft M.D.   On: 09/16/2020 15:07     ASSESSMENT & PLAN:   1. 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[place])   2. Need for prophylactic vaccination and inoculation against influenza   3. Tachycardia   4. Dysphagia, unspecified type    #Metastatic squamous cell carcinoma of oropharynx- cervical lymphadenopathy, lung metastatic disease Patient tolerates carboplatin and docetaxel 2 weeks ago.   There was plan for feeding tube placement due to progressively worsening of dysphagia and  odynophagia. Plan was delayed due to lack of COVID-19 testing.  Patient will get feeding tube placement at the end of this week and radiation to be started next week.  #Neoplasm associated pain, patient was previously on oxycodone. Switch to morphine 20 mg/ml every 4 hours as needed for moderate to severe pain.  Discussed about precautions while using narcotics.  #Patient is going to establish care with [facility] for evaluation of possible clinical trial. #His [place] also is applying for off label use of Alpelisib given his PI K3 CA mutation. #Sinus tachycardia, likely secondary to poor oral intake.  Patient has received IV hydration session today.  I will see him in 1 week for additional hydration sessions, symptom management  Follow-up: 1 week Earlie Server, MD, PhD Hematology  Oncology [place] [facility] 09/21/2020

## 2020-09-22 ENCOUNTER — Other Ambulatory Visit: Payer: Self-pay | Admitting: *Deleted

## 2020-09-22 ENCOUNTER — Other Ambulatory Visit
Admission: RE | Admit: 2020-09-22 | Discharge: 2020-09-22 | Disposition: A | Discharge status: Home / Self Care | Payer: Medicare Other | Source: Ambulatory Visit | Attending: Radiation Oncology | Admitting: Radiation Oncology

## 2020-09-22 DIAGNOSIS — Z01812 Encounter for preprocedural laboratory examination: Secondary | ICD-10-CM | POA: Diagnosis present

## 2020-09-22 DIAGNOSIS — Z20822 Contact with and (suspected) exposure to covid-19: Secondary | ICD-10-CM | POA: Insufficient documentation

## 2020-09-22 DIAGNOSIS — Z510 Encounter for antineoplastic radiation therapy: Secondary | ICD-10-CM | POA: Diagnosis not present

## 2020-09-22 DIAGNOSIS — C109 Malignant neoplasm of oropharynx, unspecified: Secondary | ICD-10-CM

## 2020-09-22 LAB — SARS CORONAVIRUS 2 (TAT 6-24 HRS): SARS Coronavirus 2: NEGATIVE

## 2020-09-23 ENCOUNTER — Other Ambulatory Visit: Payer: Self-pay | Admitting: Radiology

## 2020-09-23 ENCOUNTER — Other Ambulatory Visit: Payer: Self-pay

## 2020-09-23 ENCOUNTER — Inpatient Hospital Stay: Payer: Medicare Other

## 2020-09-23 VITALS — BP 128/80 | HR 105 | Temp 98.6°F | Resp 16

## 2020-09-23 DIAGNOSIS — C109 Malignant neoplasm of oropharynx, unspecified: Secondary | ICD-10-CM

## 2020-09-23 DIAGNOSIS — Z5111 Encounter for antineoplastic chemotherapy: Secondary | ICD-10-CM | POA: Diagnosis not present

## 2020-09-23 MED ORDER — SODIUM CHLORIDE 0.9 % IV SOLN
Freq: Once | INTRAVENOUS | Status: AC
  Filled 2020-09-23: qty 250

## 2020-09-23 MED ORDER — HEPARIN SOD (PORK) LOCK FLUSH 100 UNIT/ML IV SOLN
INTRAVENOUS | Status: AC
  Filled 2020-09-23: qty 5

## 2020-09-23 MED ORDER — HEPARIN SOD (PORK) LOCK FLUSH 100 UNIT/ML IV SOLN
500.0000 [IU] | Freq: Once | INTRAVENOUS | Status: AC
  Administered 2020-09-23: 500 [IU] via INTRAVENOUS
  Filled 2020-09-23: qty 5

## 2020-09-23 MED ORDER — DEXAMETHASONE SODIUM PHOSPHATE 10 MG/ML IJ SOLN
10.0000 mg | Freq: Once | INTRAMUSCULAR | Status: AC
  Administered 2020-09-23: 10 mg via INTRAVENOUS
  Filled 2020-09-23: qty 1

## 2020-09-23 NOTE — Progress Notes (Addendum)
Nutrition Follow-up:  Met with patient briefly during infusion of IV fluids.  Patient planning feeding tube placement tomorrow 10/22.    Patient reports can hardly drink any liquids. Feels weak.  Seen by NP today.     Anthropometrics:   No new weight   INTERVENTION:  DME is set up to see patient tomorrow afternoon after feeding tube placement.   Reviewed giving 1/2 carton of tube feeding to start off with QID.  Patient has handwritten instructions.  No questions at this time.   Recommend checking refeeding labs at least 2 times week (CMP, Mag, Phosphorus) and supplementing if low after tube is placed.  Communication sent to Dr Tasia Catchings.     MONITORING, EVALUATION, GOAL: weight trends, tube feeding tolerance   NEXT VISIT: October 25 phone call  Newton Frutiger [name], [place], [place] Registered Dietitian [phone] (mobile)

## 2020-09-23 NOTE — Progress Notes (Signed)
Patient c/o of continuous cough due to sipping water and it "getting hung" in his throat. Reports he is to have a feeding tube placed tomorrow but is feeling "very weak" due to not being able to eat or drink. Beckey Rutter, NP made aware of symptoms and tachycardia.  1353: Beckey Rutter, NP at chairside with patient.   Verbal Order from Beckey Rutter, NP for Dexamethasone 10 MG IVP x 1.

## 2020-09-24 ENCOUNTER — Telehealth: Payer: Self-pay

## 2020-09-24 ENCOUNTER — Ambulatory Visit
Admission: RE | Admit: 2020-09-24 | Discharge: 2020-09-24 | Disposition: A | Discharge status: Home / Self Care | Payer: Medicare Other | Source: Ambulatory Visit | Attending: Radiology | Admitting: Radiology

## 2020-09-24 DIAGNOSIS — C109 Malignant neoplasm of oropharynx, unspecified: Secondary | ICD-10-CM

## 2020-09-24 DIAGNOSIS — R1310 Dysphagia, unspecified: Secondary | ICD-10-CM | POA: Insufficient documentation

## 2020-09-24 HISTORY — PX: IR GASTROSTOMY TUBE MOD SED: IMG625

## 2020-09-24 IMAGING — XA IR PERC PLACEMENT GASTROSTOMY
6 of 8 series · 15 of 24 positions shown · non-contrast
Comparison: CT abdomen and pelvis-[DATE];

INDICATION: History of head neck cancer. In need of percutaneous gastrostomy
tube placement for enteric nutrition supplementation purposes.

EXAM:
PULL TROUGH GASTROSTOMY TUBE PLACEMENT

[Series 1: fluoroscopy - stored · 2 of 16 frames shown (1 of 6)]
[frame 1/16]
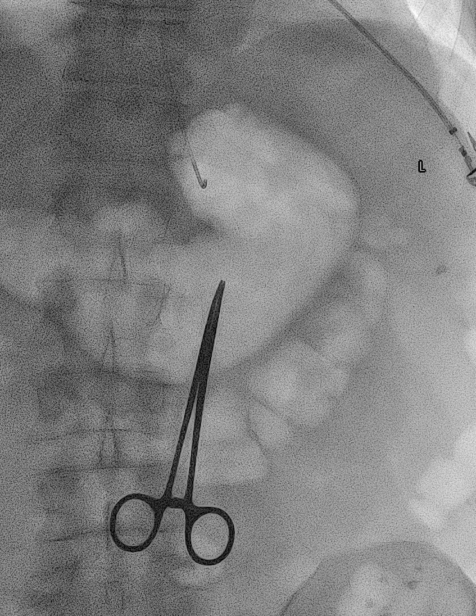
[frame 9/16]
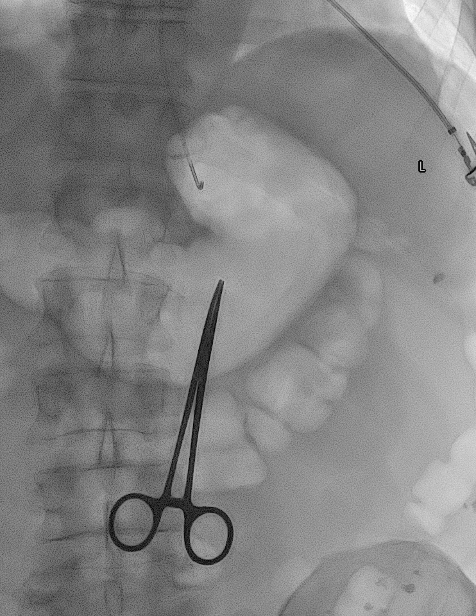

[Series 2: fluoroscopy - stored · 2 of 22 frames shown (2 of 6)]
[frame 1/22]
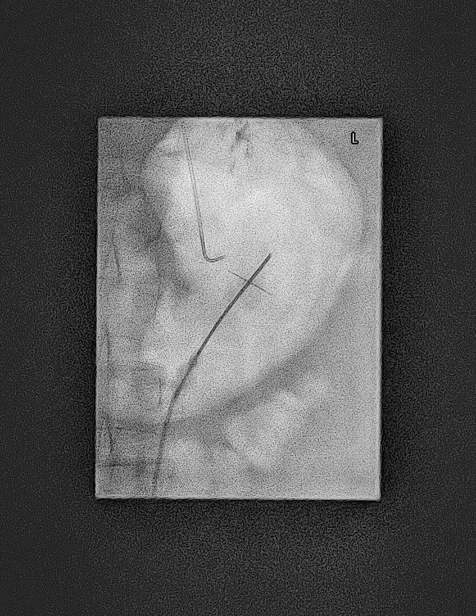
[frame 4/22]
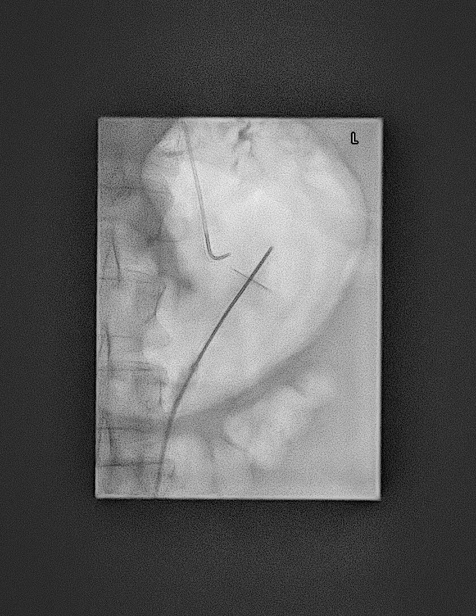

[Series 3: fluoroscopy - stored · 3 of 38 frames shown (3 of 6)]
[frame 1/38]
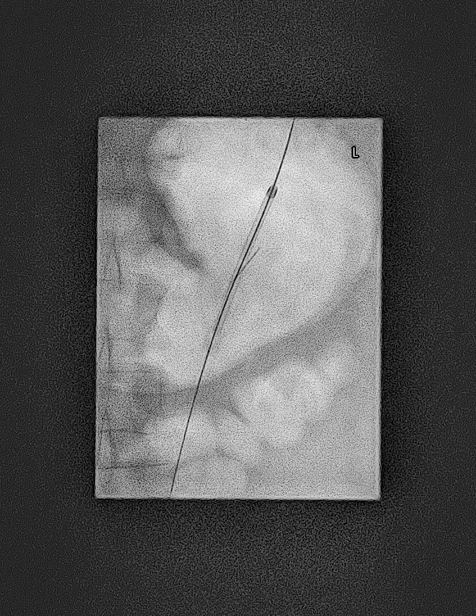
[frame 6/38]
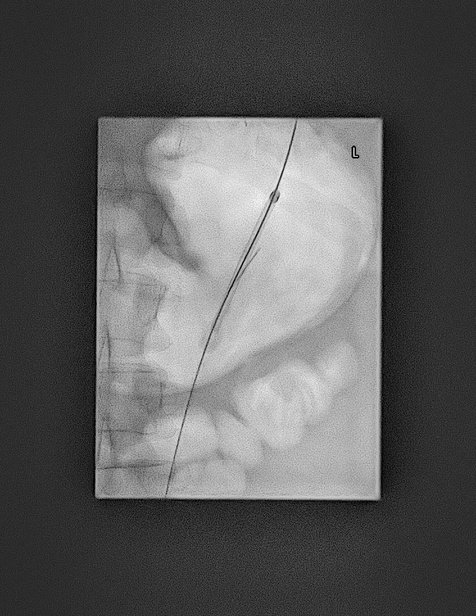
[frame 33/38]
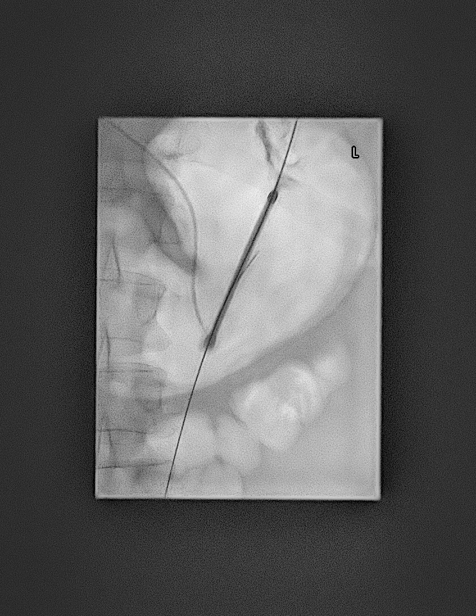

[Series 5: fluoroscopy - stored · 3 of 31 frames shown (4 of 6)]
[frame 1/31]
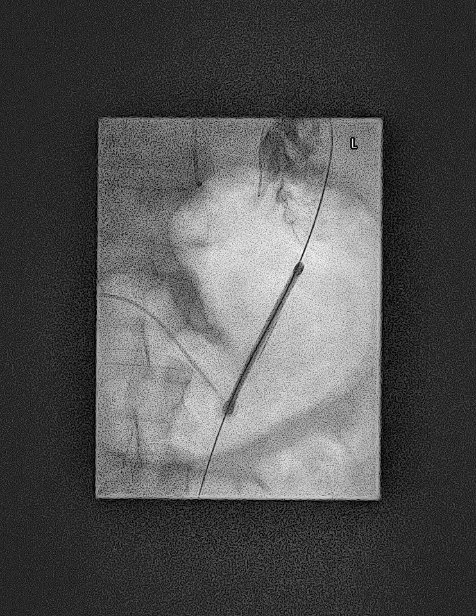
[frame 5/31]
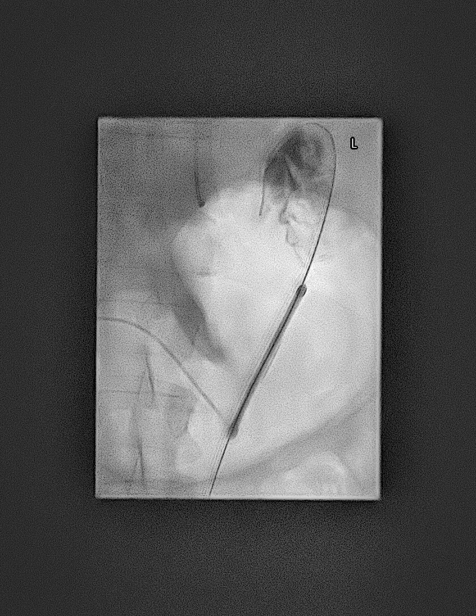
[frame 27/31]
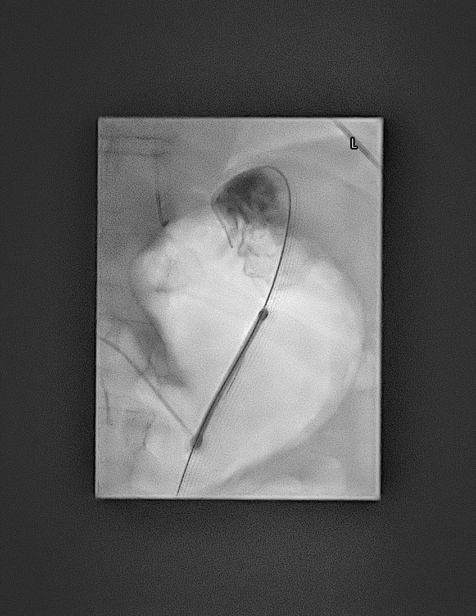

[Series 6: fluoroscopy - stored · 2 of 26 frames shown (5 of 6)]
[frame 1/26]
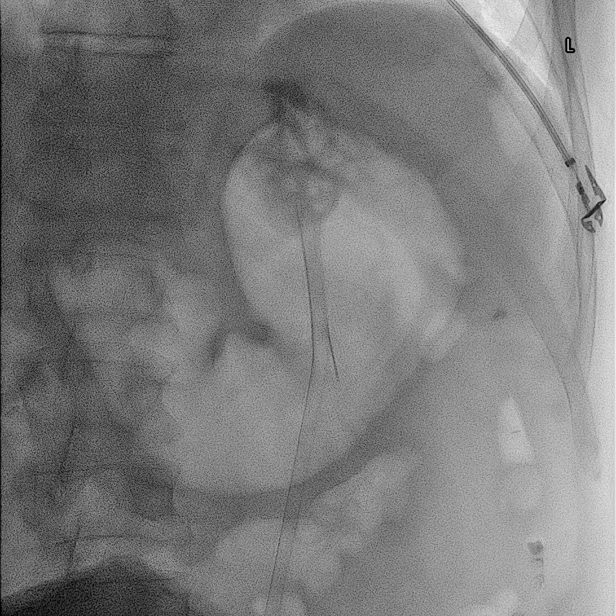
[frame 23/26]
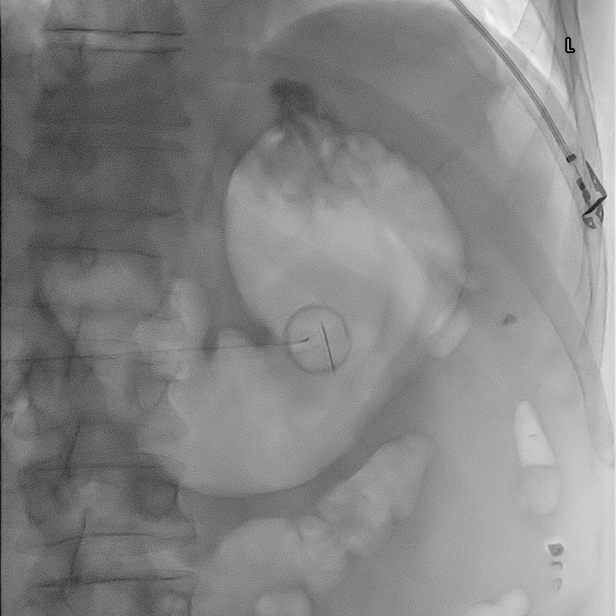

[Series 8: fluoroscopy - stored · 3 of 54 frames shown (6 of 6)]
[frame 1/54]
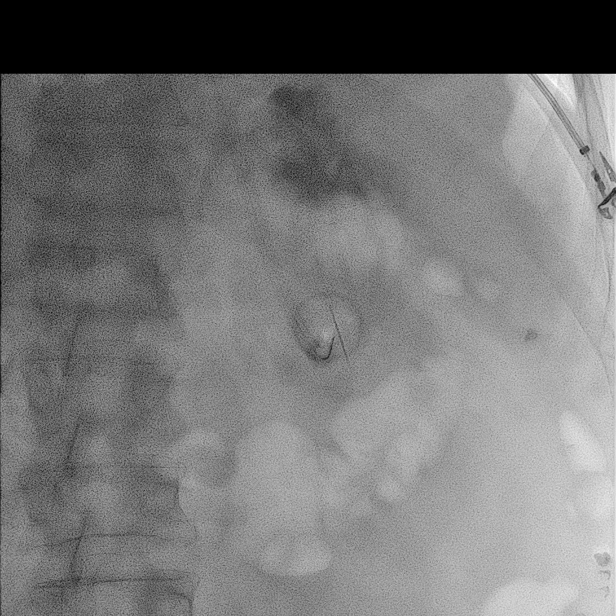
[frame 9/54]
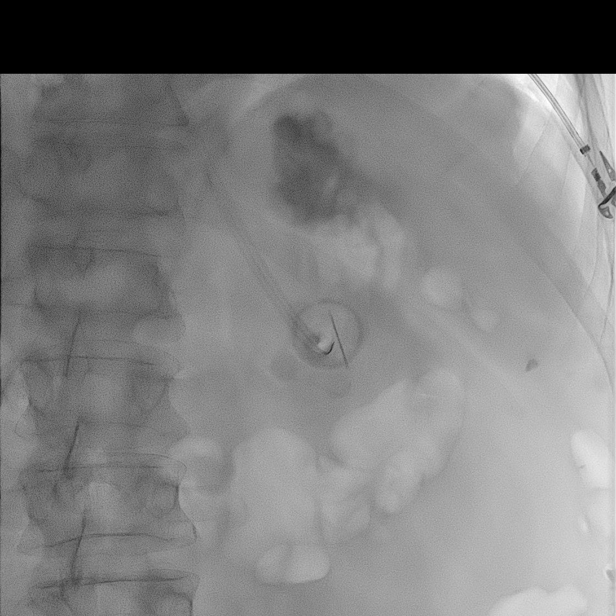
[frame 46/54]
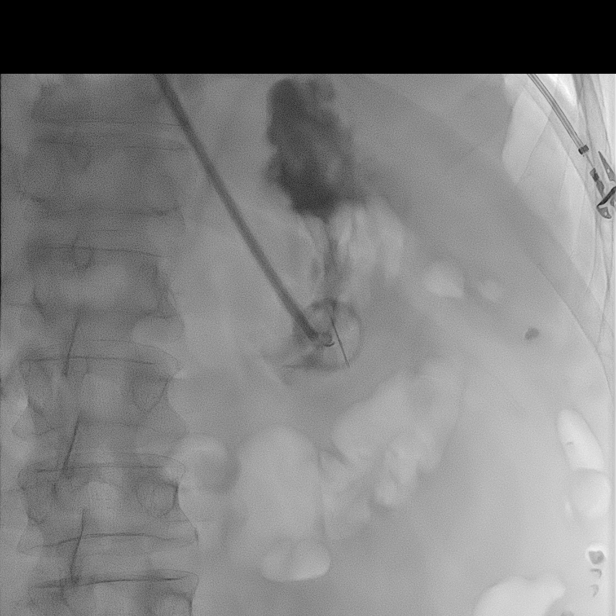

[15 of 24 positions shown; findings below may reference images not displayed]

neck CT-[DATE]

MEDICATIONS:
Ancef 2 gm IV; Antibiotics were administered within 1 hour of the
procedure.

Glucagon 1 mg IV

CONTRAST:  20 cc Visipaque 320 administered into the gastric lumen.

ANESTHESIA/SEDATION:
Moderate (conscious) sedation was employed during this procedure. A
total of Versed 4 mg and Fentanyl 100 mcg was administered
intravenously.

Moderate Sedation Time: 22 minutes. The patient's level of
consciousness and vital signs were monitored continuously by
radiology nursing throughout the procedure under my direct
supervision.

FLUOROSCOPY TIME:  7 minutes, 48 seconds (94.8 mGy)

COMPLICATIONS:
None immediate.

PROCEDURE:
Informed written consent was obtained from the patient following
explanation of the procedure, risks, benefits and alternatives. A
time out was performed prior to the initiation of the procedure.
Ultrasound scanning was performed to demarcate the edge of the left
lobe of the liver. Maximal barrier sterile technique utilized
including caps, mask, sterile gowns, sterile gloves, large sterile
drape, hand hygiene and Betadine prep.

The left upper quadrant was sterilely prepped and draped. An oral
gastric catheter was inserted into the stomach under fluoroscopy.
The existing nasogastric feeding tube was removed. The left costal
margin and air opacified transverse colon were identified and
avoided. Air was injected into the stomach for insufflation and
visualization under fluoroscopy. Under sterile conditions a 17 gauge
trocar needle was utilized to access the stomach percutaneously
beneath the left subcostal margin after the overlying soft tissues
were anesthetized with 1% Lidocaine with epinephrine. Needle
position was confirmed within the stomach with aspiration of air and
injection of small amount of contrast. A single T tack was deployed
for gastropexy. Over an Amplatz guide wire, a 9-French sheath was
inserted into the stomach. A snare device was utilized to capture
the oral gastric catheter. The snare device was pulled retrograde
from the stomach up the esophagus and out the oropharynx. The
20-French pull-through gastrostomy was connected to the snare device
and pulled antegrade through the oropharynx down the esophagus into
the stomach and then through the percutaneous tract external to the
patient. The gastrostomy was assembled externally. Contrast
injection confirms position in the stomach. Several spot
radiographic images were obtained in various obliquities for
documentation. The patient tolerated procedure well without
immediate post procedural complication.
FINDINGS: After successful fluoroscopic guided placement, the gastrostomy tube
is appropriately positioned with internal disc against the ventral
aspect of the gastric lumen.
IMPRESSION: Successful fluoroscopic insertion of a 20-French pull-through
gastrostomy tube.

The gastrostomy may be used immediately for medication
administration and in 24 hrs for the initiation of feeds.

## 2020-09-24 MED ORDER — GLUCAGON HCL RDNA (DIAGNOSTIC) 1 MG IJ SOLR
INTRAMUSCULAR | Status: AC | PRN
  Administered 2020-09-24: 1 mg via INTRAVENOUS

## 2020-09-24 MED ORDER — IODIXANOL 320 MG/ML IV SOLN
50.0000 mL | Freq: Once | INTRAVENOUS | Status: AC | PRN
  Administered 2020-09-24: 10:00:00 20 mL

## 2020-09-24 MED ORDER — FENTANYL CITRATE (PF) 100 MCG/2ML IJ SOLN
INTRAMUSCULAR | Status: AC | PRN
  Administered 2020-09-24 (×2): 50 ug via INTRAVENOUS

## 2020-09-24 MED ORDER — MIDAZOLAM HCL 2 MG/2ML IJ SOLN
INTRAMUSCULAR | Status: AC | PRN
  Administered 2020-09-24 (×4): 1 mg via INTRAVENOUS

## 2020-09-24 MED ORDER — MIDAZOLAM HCL 5 MG/5ML IJ SOLN
INTRAMUSCULAR | Status: AC
  Filled 2020-09-24: qty 5

## 2020-09-24 MED ORDER — SODIUM CHLORIDE 0.9 % IV SOLN: INTRAVENOUS | Status: DC

## 2020-09-24 MED ORDER — FENTANYL CITRATE (PF) 100 MCG/2ML IJ SOLN
INTRAMUSCULAR | Status: AC
  Filled 2020-09-24: qty 2

## 2020-09-24 MED ORDER — GLUCAGON HCL RDNA (DIAGNOSTIC) 1 MG IJ SOLR
INTRAMUSCULAR | Status: AC
  Filled 2020-09-24: qty 1

## 2020-09-24 MED ORDER — CEFAZOLIN SODIUM-DEXTROSE 1-4 GM/50ML-% IV SOLN
INTRAVENOUS | Status: AC | PRN
  Administered 2020-09-24: 2 g via INTRAVENOUS

## 2020-09-24 MED ORDER — CEFAZOLIN SODIUM-DEXTROSE 2-4 GM/100ML-% IV SOLN
2.0000 g | INTRAVENOUS | Status: DC
  Filled 2020-09-24: qty 100

## 2020-09-24 NOTE — Procedures (Signed)
Pre procedure Dx: Dysphagia Post Procedure Dx: Same  Successful fluoroscopic guided insertion of gastrostomy tube.   The gastrostomy tube may be used immediately for medications.   Tube feeds may be initiated in 24 hours as per the primary team.    EBL: Minimal  Complications: None immediate  Jay Riyad Keena, MD Pager #: [phone]    

## 2020-09-24 NOTE — Telephone Encounter (Signed)
Patient had feeding tube placed today.  Jennet Maduro, nutritionist, has provided him instructions and given him some supplies and contacted [employer] to meet with him today for delvery of more tube feeding supplies/formula as well as more teaching.  Desmond Lope will follow up with him on Monday.  It is also recommended for him to have CMP, Mag and Phosphorus (refeeding labs) checked on 10/26 and 10/28 and supplement these labs if they are low.  Tube feeding will be titrated slowly.    Patient is already scheduled for labs on 10/26 for lab/MD/tx.  Lab encounter has been added to the lab/Fulphila/IVF on 09/30/20  Dr. Tasia Catchings would like to change the chemo appt on 10/26 to lab/MD/IVF and change 10/28 appts to lab/poss IVF.  Please change appts as MD recommends.

## 2020-09-24 NOTE — Progress Notes (Signed)
Patient clinically stable post PEG tube placement per Dr Pascal Lux, tolerated well. Vitals stable pre and post procedure. Awake/alert and oriented post procedure. Dressing dry and intact to peg site. Received Versed 4 mg along with Fentanyl 100 mcg IV for procedure. Report given to [facility] in specials post procedure.

## 2020-09-24 NOTE — Discharge Instructions (Signed)
Moderate Conscious Sedation, Adult Sedation is the use of medicines to promote relaxation and relieve discomfort and anxiety. Moderate conscious sedation is a type of sedation. Under moderate conscious sedation, you are less alert than normal, but you are still able to respond to instructions, touch, or both. Moderate conscious sedation is used during short medical and dental procedures. It is milder than deep sedation, which is a type of sedation under which you cannot be easily woken up. It is also milder than general anesthesia, which is the use of medicines to make you unconscious. Moderate conscious sedation allows you to return to your regular activities sooner. Tell a health care provider about:  Any allergies you have.  All medicines you are taking, including vitamins, herbs, eye drops, creams, and over-the-counter medicines.  Use of steroids (by mouth or creams).  Any problems you or family members have had with sedatives and anesthetic medicines.  Any blood disorders you have.  Any surgeries you have had.  Any medical conditions you have, such as sleep apnea.  Whether you are pregnant or may be pregnant.  Any use of cigarettes, alcohol, marijuana, or street drugs. What are the risks? Generally, this is a safe procedure. However, problems may occur, including:  Getting too much medicine (oversedation).  Nausea.  Allergic reaction to medicines.  Trouble breathing. If this happens, a breathing tube may be used to help with breathing. It will be removed when you are awake and breathing on your own.  Heart trouble.  Lung trouble. What happens before the procedure? Staying hydrated Follow instructions from your health care provider about hydration, which may include:  Up to 2 hours before the procedure - you may continue to drink clear liquids, such as water, clear fruit juice, black coffee, and plain tea. Eating and drinking restrictions Follow instructions from your  health care provider about eating and drinking, which may include:  8 hours before the procedure - stop eating heavy meals or foods such as meat, fried foods, or fatty foods.  6 hours before the procedure - stop eating light meals or foods, such as toast or cereal.  6 hours before the procedure - stop drinking milk or drinks that contain milk.  2 hours before the procedure - stop drinking clear liquids. Medicine Ask your health care provider about:  Changing or stopping your regular medicines. This is especially important if you are taking diabetes medicines or blood thinners.  Taking medicines such as aspirin and ibuprofen. These medicines can thin your blood. Do not take these medicines before your procedure if your health care provider instructs you not to.  Tests and exams  You will have a physical exam.  You may have blood tests done to show: ? How well your kidneys and liver are working. ? How well your blood can clot. General instructions  Plan to have someone take you home from the hospital or clinic.  If you will be going home right after the procedure, plan to have someone with you for 24 hours. What happens during the procedure?  An IV tube will be inserted into one of your veins.  Medicine to help you relax (sedative) will be given through the IV tube.  The medical or dental procedure will be performed. What happens after the procedure?  Your blood pressure, heart rate, breathing rate, and blood oxygen level will be monitored often until the medicines you were given have worn off.  Do not drive for 24 hours. This information is not   intended to replace advice given to you by your health care provider. Make sure you discuss any questions you have with your health care provider. Document Revised: 11/02/2017 Document Reviewed: 03/11/2016 Elsevier Patient Education  [place]. PEG Tube Home Guide A PEG tube is used to put food and fluids into the stomach.  Before you leave the hospital, make sure that you know:  How to care for your PEG tube.  How to care for the opening (stoma) in your belly.  How to give yourself a feeding.  How to give yourself medicines.  When to call your doctor for help. Supplies needed:  Soapy water.  Clean, plain water.  Clean washcloth.  Bandage (dressing). This is optional.  Syringe. How to care for a PEG tube Check your PEG tube every day. Make sure:  It is not too tight.  It is in the correct place. There is a mark on the tube that shows when the tube is in the correct place. Adjust the tube if you need to. Cleaning your stoma Clean your stoma every day. Follow these steps: 1. Wash your hands with soap and water. If soap and water are not available, use hand sanitizer. 2. Check your stoma. Let your doctor know if there is: ? Redness. ? Leaking. ? Skin irritation. 3. Wash the stoma gently with warm, soapy water. 4. Rinse the stoma with plain water. 5. Pat the stoma area dry. 6. You may place a bandage around the opening of your stoma as told by your doctor.  Giving a feeding Your doctor will tell you:  How much nutrition and fluid you will need for each feeding.  How often to have a feeding.  Whether you should take medicine in the tube by itself or with a feeding. To give yourself a feeding, follow these steps: 1. Lay out all of the things that you will need. 2. Make sure that the nutritional formula is at room temperature. 3. Wash your hands with soap and water. 4. Sit up or stand up straight. You will need to stay sitting up or standing up while you give yourself a feeding. 5. Make sure the syringe plunger is pushed in. Place the tip of the syringe in clean water, and slowly pull the plunger to bring (draw up) the water into the syringe. 6. Remove the clamp and the cap from the PEG tube. 7. Push the water out of the syringe to clean (flush) the tube. 8. If the tube is clear, draw up  the formula into the syringe. Make sure to use the right amount for each feeding. Add water if you need to. 9. Slowly push the formula from the syringe through the tube. 10. After the feeding, flush the tube with water. 11. Put the clamp and the cap on the tube. 12. Stay sitting up or standing up straight for at least 30 minutes. Giving medicine To give yourself medicine, follow these steps: 1. Lay out all of the things that you will need. 2. If your medicine is in tablet form, crush it and dissolve it in water. 3. Wash your hands with soap and water. 4. Sit up or stand up straight. You will need to stay sitting up or standing up while you give yourself medicine. 5. Make sure the syringe plunger is pushed in. Place the tip of the syringe in clean water, and slowly pull the plunger to bring (draw up) the water into the syringe. 6. Remove the clamp and the  cap from the PEG tube. 7. Push the water out of the syringe to clean (flush) the tube. 8. If the tube is clear, draw up the medicine into the syringe. 9. Slowly push the medicine from the syringe through the tube. 10. Flush the tube with water. 11. Put the clamp and the cap on the tube. 12. Stay sitting up or standing up straight for at least 30 minutes. Do not take sustained release (SR) medicines through your tube. If you are not sure if your medicine is an SR medicine, ask your doctor. Contact a doctor if:  The area around your stoma is sore, irritated, or red.  You have belly pain or bloating while you are feeding or after you feed.  You feel sick to your stomach (nauseous) for a long time.  You have trouble pooping (constipation) or you have watery poop (diarrhea) for a long time.  You have a fever.  You have problems with your PEG tube. Get help right away if:  Your tube is blocked.  Your tube falls out.  You have pain around your tube.  You are bleeding from your tube.  Your tube is leaking.  You choke or you have  trouble breathing while you are feeding or after you feed. Summary  A PEG tube is used to put food and fluids into the stomach.  Before you leave the hospital, you will be taught how to use and care for your PEG tube.  Your doctor will give you instructions on how to give yourself feedings and medicines.  Contact your doctor if you have fever or soreness, redness, or irritation around your stoma.  Get help right away if your tube leaks, is blocked, or falls out. Also, get help right away if you have pain or bleeding around your stoma. This information is not intended to replace advice given to you by your health care provider. Make sure you discuss any questions you have with your health care provider. Document Revised: 02/06/2019 Document Reviewed: 12/03/2017 Elsevier Patient Education  2020 [employer].

## 2020-09-24 NOTE — Telephone Encounter (Signed)
Done.. Appts notes has been changed. Appts times stayed the same.

## 2020-09-24 NOTE — H&P (Signed)
Chief Complaint: Patient was seen in consultation today for gastrostomy tube placement  Referring Physician(s): Earlie Server  Supervising Physician: Sandi Mariscal  Patient Status: [facility] - Out-pt  History of Present Illness: Ivan Sagan. is a 64 y.o. male with a medical history significant for squamous cell carcinoma of the oropharynx. He presented to his PCP the summer of 2020 with persistent hoarseness. Flexible laryngoscopy showed vocal cord paralysis and a PET scan 08/20/2019 showed hypermetabolic adenopathy in the neck, chest and lungs. The pathology from a supraclavicular node biopsy was positive for metastatic squamous cell carcinoma. He was started on chemotherapy with initially positive response but he has unfortunately experienced disease progression. He is now receiving palliative chemotherapy and radiation. He has dysphagia that is waxing and waning related to his chemotherapy regimen.   Interventional Radiology has been asked to evaluate this patient for an image-guided gastrostomy tube placement to assist this patient with meeting his nutritional needs. Of note, Ivan Garcia presented to [facility] 09/17/20 for this procedure but due to a lack of COVID test the procedure was rescheduled. The patient was COVID tested 09/22/20 with a negative result.   Past Medical History:  Diagnosis Date  . Arthritis   . Cancer ([place])    Head and neck cancer  . Hemorrhoids   . Hyperlipidemia   . Hypertension     Past Surgical History:  Procedure Laterality Date  . COLONOSCOPY WITH PROPOFOL N/A 04/04/2017   Procedure: COLONOSCOPY WITH PROPOFOL;  Surgeon: Robert Bellow, MD;  Location: [facility] ENDOSCOPY;  Service: Endoscopy;  Laterality: N/A;  . MANDIBLE SURGERY    . PORT-A-CATH REMOVAL  07/17/2018  . PORTA CATH INSERTION N/A 12/19/2017   Procedure: PORTA CATH INSERTION;  Surgeon: Algernon Huxley, MD;  Location: [place] CV LAB;  Service: Cardiovascular;  Laterality: N/A;  . PORTA CATH  INSERTION N/A 07/17/2018   Procedure: PORTA CATH INSERTION;  Surgeon: Algernon Huxley, MD;  Location: [place] CV LAB;  Service: Cardiovascular;  Laterality: N/A;  . PORTA CATH INSERTION N/A 09/18/2019   Procedure: PORTA CATH INSERTION;  Surgeon: Algernon Huxley, MD;  Location: [place] [facility] LAB;  Service: Cardiovascular;  Laterality: N/A;  . TONSILLECTOMY      Allergies: Patient has no known allergies.  Medications: Prior to Admission medications   Medication Sig Start Date End Date Taking? Authorizing Provider  amLODipine (NORVASC) 5 MG tablet Take 5 mg by mouth daily.    [provider]  dexamethasone (DECADRON) 1 MG tablet Take 1 mg by mouth 2 (two) times daily with a meal.    [provider]  DULoxetine (CYMBALTA) 30 MG capsule TAKE 1 CAPSULE BY MOUTH EVERY DAY 09/02/20   Earlie Server, MD  lidocaine (XYLOCAINE) 2 % solution  11/17/19   [provider]  lidocaine-prilocaine (EMLA) cream Apply to affected area once Patient not taking: Reported on 09/21/2020 09/10/19   Earlie Server, MD  morphine (ROXANOL) 20 MG/ML concentrated solution Take 1 mL (20 mg total) by mouth every 4 (four) hours as needed for moderate pain or severe pain. 09/21/20   Earlie Server, MD  Multiple Vitamins-Minerals (MULTIVITAMIN WITH MINERALS) tablet Take 1 tablet by mouth daily. Patient not taking: Reported on 09/21/2020    [provider]  nystatin (MYCOSTATIN) 100000 UNIT/ML suspension Take 5 mLs (500,000 Units total) by mouth in the morning, at noon, and at bedtime. SWISH AND SPIT 08/17/20   Earlie Server, MD  omeprazole (PRILOSEC) 20 MG capsule TAKE 1  CAPSULE BY MOUTH EVERY DAY Patient taking differently: Take 20 mg by mouth daily as needed.  11/17/19   Earlie Server, MD  ondansetron (ZOFRAN) 8 MG tablet Take 1 tablet (8 mg total) by mouth 2 (two) times daily as needed for refractory nausea / vomiting. Start on day 3 after carboplatin chemo. 09/10/19   Earlie Server, MD  prochlorperazine (COMPAZINE) 10 MG  tablet Take 1 tablet (10 mg total) by mouth every 6 (six) hours as needed (Nausea or vomiting). 09/10/19   Earlie Server, MD  rosuvastatin (CRESTOR) 10 MG tablet Take 10 mg by mouth every evening. 01/26/17   [provider]     Family History  Problem Relation Age of Onset  . Breast cancer Mother   . Asthma Mother   . Congestive Heart Failure Mother   . Prostate cancer Father   . Brain cancer Father   . Bladder Cancer Father     Social History   Socioeconomic History  . Marital status: Single    Spouse name: Not on file  . Number of children: Not on file  . Years of education: Not on file  . Highest education level: Not on file  Occupational History  . Not on file  Tobacco Use  . Smoking status: Never Smoker  . Smokeless tobacco: Never Used  Vaping Use  . Vaping Use: Never used  Substance and Sexual Activity  . Alcohol use: No  . Drug use: No  . Sexual activity: Not on file  Other Topics Concern  . Not on file  Social History Narrative  . Not on file   Social Determinants of Health   Financial Resource Strain:   . Difficulty of Paying Living Expenses: Not on file  Food Insecurity:   . Worried About Charity fundraiser in the Last Year: Not on file  . Ran Out of Food in the Last Year: Not on file  Transportation Needs:   . Lack of Transportation (Medical): Not on file  . Lack of Transportation (Non-Medical): Not on file  Physical Activity:   . Days of Exercise per Week: Not on file  . Minutes of Exercise per Session: Not on file  Stress:   . Feeling of Stress : Not on file  Social Connections:   . Frequency of Communication with Friends and Family: Not on file  . Frequency of Social Gatherings with Friends and Family: Not on file  . Attends Religious Services: Not on file  . Active Member of Clubs or Organizations: Not on file  . Attends Archivist Meetings: Not on file  . Marital Status: Not on file    Review of Systems: A 12 point ROS  discussed and pertinent positives are indicated in the HPI above.  All other systems are negative.  Review of Systems  Constitutional: Positive for appetite change and fatigue.  HENT: Positive for trouble swallowing.   Respiratory: Negative for cough and shortness of breath.   Cardiovascular: Negative for chest pain and leg swelling.  Gastrointestinal: Negative for abdominal pain, diarrhea, nausea and vomiting.  Musculoskeletal: Negative for back pain.  Neurological: Negative for dizziness and headaches.    Vital Signs: BP 134/85   Pulse (!) 108   Temp 98.6 F (37 C) (Oral)   Resp 20   Ht 5\' 10"  (1.778 m)   Wt 180 lb (81.6 kg)   SpO2 99%   BMI 25.83 kg/m   Physical Exam Constitutional:      General: He  is not in acute distress. HENT:     Mouth/Throat:     Mouth: Mucous membranes are moist.     Pharynx: Oropharynx is clear.  Neck:     Comments: Anterior neck swelling and radiation skin changes  Cardiovascular:     Rate and Rhythm: Normal rate and regular rhythm.     Pulses: Normal pulses.     Heart sounds: Normal heart sounds.  Pulmonary:     Effort: Pulmonary effort is normal.     Breath sounds: Normal breath sounds.  Abdominal:     General: Bowel sounds are normal.     Palpations: Abdomen is soft.  Musculoskeletal:        General: Normal range of motion.  Skin:    General: Skin is warm and dry.  Neurological:     Mental Status: He is alert and oriented to person, place, and time.  Psychiatric:        Mood and Affect: Mood normal.        Behavior: Behavior normal.        Thought Content: Thought content normal.        Judgment: Judgment normal.     Imaging: CT Abdomen Wo Contrast  Result Date: 09/16/2020 CLINICAL DATA:  64 year old with squamous cell carcinoma of the oropharynx. Evaluate anatomy for gastrostomy tube placement. EXAM: CT ABDOMEN WITHOUT CONTRAST TECHNIQUE: Multidetector CT imaging of the abdomen was performed following the standard protocol  without IV contrast. COMPARISON:  PET-CT 04/26/2020 FINDINGS: Lower chest: Few densities at the lung bases likely represent atelectasis. Hepatobiliary: 1.3 cm hypodensity in the left hepatic lobe likely represents a cyst. Gallbladder is decompressed. No biliary dilatation. Pancreas: Unremarkable. No pancreatic ductal dilatation or surrounding inflammatory changes. Spleen: Normal in size without focal abnormality. Adrenals/Urinary Tract: Normal appearance of the adrenal glands. Large low-density cyst involving the right kidney upper pole measures up to 5.0 cm. Tiny stone in the right kidney without hydronephrosis. Probable left renal parapelvic cysts. No definite left hydronephrosis. Stomach/Bowel: Stomach is mildly distended with oral contrast. Normal appearance of the stomach. There are no structures located between the stomach in the anterior abdominal wall. Transverse colon is caudal to the stomach. Small amount of high-density material within the appendix without inflammatory changes. No evidence for bowel obstruction. Vascular/Lymphatic: Atherosclerotic calcifications in the abdominal aorta without aneurysm. No significant lymph node enlargement in the abdomen. Other: Negative for ascites.  Negative for free air. Musculoskeletal: Degenerative facet disease in lower lumbar spine. IMPRESSION: 1. Anatomy is amendable for a percutaneous gastrostomy tube placement. 2. Evidence for bilateral renal cysts. 3.  Aortic Atherosclerosis (ICD10-I70.0). 4. Tiny nonobstructive right kidney stone. Electronically Signed   By: Markus Daft M.D.   On: 09/16/2020 15:07    Labs:  CBC: Recent Labs    08/31/20 1244 09/07/20 0833 09/13/20 0833 09/17/20 0959  WBC 14.2* 9.0 6.8 17.1*  HGB 12.6* 11.4* 10.6* 11.0*  HCT 37.2* 34.6* 32.0* 34.0*  PLT 230 421* 212 219    COAGS: Recent Labs    09/17/20 0959  INR 0.9    BMP: Recent Labs    07/27/20 0814 07/27/20 0814 08/17/20 0804 08/17/20 0804 08/23/20 1414  08/31/20 1244 09/07/20 0833 09/13/20 0833  NA 137   < > 139   < > 137 137 138 134*  K 3.8   < > 4.0   < > 3.6 4.5 3.9 3.9  CL 103   < > 105   < > 100 98 102 99  CO2 27   < > 25   < > 26 27 27 26   GLUCOSE 101*   < > 101*   < > 129* 134* 124* 135*  BUN 20   < > 23   < > 21 25* 14 17  CALCIUM 9.0   < > 9.2   < > 8.9 9.3 9.3 9.2  CREATININE 1.34*   < > 1.52*   < > 1.50* 1.64* 1.38* 1.30*  GFRNONAA 55*   < > 47*   < > 48* 43* 53* 57*  GFRAA >60  --  55*  --  56* 50*  --   --    < > = values in this interval not displayed.    LIVER FUNCTION TESTS: Recent Labs    08/23/20 1414 08/31/20 1244 09/07/20 0833 09/13/20 0833  BILITOT 0.8 0.6 0.9 0.8  AST 18 20 20 21   ALT 16 17 16 17   ALKPHOS 93 103 79 100  PROT 7.0 7.3 7.3 6.7  ALBUMIN 3.5 3.5 3.4* 3.5    TUMOR MARKERS: No results for input(s): AFPTM, CEA, CA199, CHROMGRNA in the last 8760 hours.  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 Maxwell Marion., 64 year old male, presents today to the [facility] Interventional Radiology department for an image-guided gastrostomy tube placement. Ivan Garcia states he already has opioid pain medicine at home and does not need a prescription for pain medicine following today's procedure.   Risks and benefits image guided gastrostomy tube placement was discussed with the patient including, but not limited to the need for a barium enema during the procedure, bleeding, infection, peritonitis and/or damage to adjacent structures.  All of the patient's questions were answered, patient is agreeable to proceed.  The patient has been NPO. Labs and vitals have been reviewed.   Consent signed and in chart.  Thank you for this interesting consult.  I greatly enjoyed meeting Ivan & Platt. and look forward to participating in their care.  A copy of this report was sent to the requesting provider on this  date.  Electronically Signed: Soyla Dryer, AGACNP-BC [phone] 09/24/2020, 8:41 AM   I spent a total of  30 Minutes   in face to face in clinical consultation, greater than 50% of which was counseling/coordinating care for gastrostomy tube placement.

## 2020-09-27 ENCOUNTER — Other Ambulatory Visit: Payer: Self-pay

## 2020-09-27 ENCOUNTER — Inpatient Hospital Stay: Payer: Medicare Other

## 2020-09-27 ENCOUNTER — Ambulatory Visit: Admission: RE | Admit: 2020-09-27 | Payer: Medicare Other | Source: Ambulatory Visit

## 2020-09-27 NOTE — Progress Notes (Signed)
Nutrition Follow-up:  Met with patient today prior to radiation "dry run".    Patient s/p PEG placement on 10/22.  Patient reports that he gave 1/2 cartons of [employer] [place] on Saturday and tolerated well.  Reports Sunday gave 1 full carton and remaining 4 feedings 1/2 cartons.  Today will give 2 full cartons of tube feeding and remaining 3, 1/2 cartons.  He is continuing titration and tolerating well. Reports that he feels better.    Able to eat small sips of tomato soup, 16 oz bottle of water orally.      Medications: reviewed  Labs: reviewed   NUTRITION DIAGNOSIS: Inadequate oral intake but relying on feeding tube   INTERVENTION:  Recommend patient to continue slow titration of [employer] 1.4 formula.  Goal will be 5 full cartons daily.  Patient flushing with 82m of water before and 1272mafter each feeding.  Will continue to drink additional 16 oz bottle water orally.  Aware that if unable to get in orally will place via tube.   Contact information provided    MONITORING, EVALUATION, GOAL: weight trends, intake   NEXT VISIT: Oct 28 during fluids  Tymber Stallings B. AlZenia ResidesRDBuckinghamLDLawrenceegistered Dietitian [phone])

## 2020-09-28 ENCOUNTER — Inpatient Hospital Stay: Payer: Medicare Other

## 2020-09-28 ENCOUNTER — Encounter: Payer: Self-pay | Admitting: Oncology

## 2020-09-28 ENCOUNTER — Inpatient Hospital Stay: Payer: Medicare Other | Admitting: Hospice and Palliative Medicine

## 2020-09-28 ENCOUNTER — Ambulatory Visit
Admission: RE | Admit: 2020-09-28 | Discharge: 2020-09-28 | Disposition: A | Discharge status: Home / Self Care | Payer: Medicare Other | Source: Ambulatory Visit | Attending: Radiation Oncology | Admitting: Radiation Oncology

## 2020-09-28 ENCOUNTER — Inpatient Hospital Stay (HOSPITAL_BASED_OUTPATIENT_CLINIC_OR_DEPARTMENT_OTHER): Payer: Medicare Other | Admitting: Oncology

## 2020-09-28 VITALS — BP 119/81 | HR 90 | Temp 96.2°F | Resp 18 | Wt 179.1 lb

## 2020-09-28 DIAGNOSIS — D649 Anemia, unspecified: Secondary | ICD-10-CM

## 2020-09-28 DIAGNOSIS — C109 Malignant neoplasm of oropharynx, unspecified: Secondary | ICD-10-CM

## 2020-09-28 DIAGNOSIS — Z510 Encounter for antineoplastic radiation therapy: Secondary | ICD-10-CM | POA: Diagnosis not present

## 2020-09-28 DIAGNOSIS — N1831 Chronic kidney disease, stage 3a: Secondary | ICD-10-CM | POA: Diagnosis not present

## 2020-09-28 DIAGNOSIS — R1310 Dysphagia, unspecified: Secondary | ICD-10-CM | POA: Diagnosis not present

## 2020-09-28 DIAGNOSIS — Z5111 Encounter for antineoplastic chemotherapy: Secondary | ICD-10-CM | POA: Diagnosis not present

## 2020-09-28 LAB — COMPREHENSIVE METABOLIC PANEL
ALT: 13 U/L (ref 0–44)
AST: 21 U/L (ref 15–41)
Albumin: 3.2 g/dL — ABNORMAL LOW (ref 3.5–5.0)
Alkaline Phosphatase: 69 U/L (ref 38–126)
Anion gap: 8 (ref 5–15)
BUN: 16 mg/dL (ref 8–23)
CO2: 28 mmol/L (ref 22–32)
Calcium: 9 mg/dL (ref 8.9–10.3)
Chloride: 102 mmol/L (ref 98–111)
Creatinine, Ser: 1.07 mg/dL (ref 0.61–1.24)
GFR, Estimated: >60 mL/min (ref >60)
Glucose, Bld: 117 mg/dL — ABNORMAL HIGH (ref 70–99)
Potassium: 3.9 mmol/L (ref 3.5–5.1)
Sodium: 138 mmol/L (ref 135–145)
Total Bilirubin: 0.7 mg/dL (ref 0.3–1.2)
Total Protein: 6.5 g/dL (ref 6.5–8.1)

## 2020-09-28 LAB — CBC WITH DIFFERENTIAL/PLATELET
Abs Immature Granulocytes: 0.03 10*3/uL (ref 0.00–0.07)
Basophils Absolute: 0.1 10*3/uL (ref 0.0–0.1)
Basophils Relative: 1 %
Eosinophils Absolute: 0 10*3/uL (ref 0.0–0.5)
Eosinophils Relative: 0 %
HCT: 31.3 % — ABNORMAL LOW (ref 39.0–52.0)
Hemoglobin: 10.1 g/dL — ABNORMAL LOW (ref 13.0–17.0)
Immature Granulocytes: 0 %
Lymphocytes Relative: 5 %
Lymphs Abs: 0.3 10*3/uL — ABNORMAL LOW (ref 0.7–4.0)
MCH: 29.8 pg (ref 26.0–34.0)
MCHC: 32.3 g/dL (ref 30.0–36.0)
MCV: 92.3 fL (ref 80.0–100.0)
Monocytes Absolute: 0.6 10*3/uL (ref 0.1–1.0)
Monocytes Relative: 10 %
Neutro Abs: 5.7 10*3/uL (ref 1.7–7.7)
Neutrophils Relative %: 84 %
Platelets: 298 10*3/uL (ref 150–400)
RBC: 3.39 MIL/uL — ABNORMAL LOW (ref 4.22–5.81)
RDW: 16.6 % — ABNORMAL HIGH (ref 11.5–15.5)
WBC: 6.8 10*3/uL (ref 4.0–10.5)
nRBC: 0 % (ref 0.0–0.2)

## 2020-09-28 LAB — PHOSPHORUS: Phosphorus: 2.9 mg/dL (ref 2.5–4.6)

## 2020-09-28 LAB — MAGNESIUM: Magnesium: 2.1 mg/dL (ref 1.7–2.4)

## 2020-09-28 MED ORDER — MORPHINE SULFATE (CONCENTRATE) 20 MG/ML PO SOLN: 20.0000 mg | ORAL | 0 refills | Status: DC | PRN

## 2020-09-28 MED ORDER — SENNA 8.8 MG/5ML PO LIQD: 17.6000 mg | Freq: Every day | ORAL | 1 refills | Status: DC

## 2020-09-28 MED ORDER — HEPARIN SOD (PORK) LOCK FLUSH 100 UNIT/ML IV SOLN
500.0000 [IU] | Freq: Once | INTRAVENOUS | Status: AC
  Administered 2020-09-28: 500 [IU] via INTRAVENOUS
  Filled 2020-09-28: qty 5

## 2020-09-28 NOTE — Progress Notes (Signed)
Hematology/Oncology  Follow Up note [facility] Telephone:([phone]) [phone] Fax:([phone]   Patient Care Team: Albina Billet, MD as PCP - General (Internal Medicine) Albina Billet, MD (Internal Medicine) Bary Castilla, Forest Gleason, MD (General Surgery) Noreene Filbert, MD as Referring Physician (Radiation Oncology) Earlie Server, MD as Consulting Physician (Oncology)  CHIEF COMPLAINTS/PURPOSE OF CONSULTATION:  Follow up for head and neck cancer.  HISTORY OF PRESENTING ILLNESS:  Ivan Garcia. is a  64 y.o.  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 P16 positive. cT2 cN2 disease, stage II, # Concurrent ChemoRT  Cisplatin 100 mg/m2 q3weeks with concurrent RT which started on 12/31/2017.  S/p one dose of Cisplatin. Cisplatin discontinued due to nephrotoxicity. Switch to weekly Carboplatin (AUC 2) / Taxol 43m/m2 [ finished May 2019] Patient has finished concurrent chemoradiation in May 2019  # He reports doing well until in July 2020, he started to have hoarseness after singing for 2 to 3 hours. He has changed his insurance and Dr. MTami Ribasis no longer covered by his current insurance.  So he establish care [facility] [place] [place] [facility]. Flexible laryngoscopy showed Vocal cord paralysis.  Patient was recommended to have a PET scan done for restaging. Patient's primary care provider Dr. THall Busingordered a PET scan for patient which was done on 08/20/2019. 08/20/2019 PET scan showed asymmetric hypermetabolic him involving the left vocal cords with hypermetabolic adenopathy in the neck, and the chest.  He also has hypermetabolic pulmonary nodule along the minor fissure which is felt to be metastatic. Tiny proximal left ureteral stone without hydronephrosis.  Possible gallbladder sludge.  Enlarged prostate.   Atherosclerosis.  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  # NGS showed PD-L1 CPS 20, TMB indeterminant, MSI stable,PIK3CA mutation Started on chemotherapy with 6 cycles of carboplatin, 5-FU and Keytruda, finished in February 2021.  And has been on Keytruda maintenance. Initially he had a good response, however progressed whiled on Keytruda maintenance.  PET 04/26/2020 worsening of disease in the chest.  Interval enlargement of the right thoracic inlet and paratracheal lymph nodes that were previously enlarged with new activity in the right hilum corresponding to a lymph node in this location.  Also increased activity in the right upper lobe nodule. Right level 3 lymph node in the neck and anterior mediastinal nodal activity is diminished.  These areas are quite small on prior study. Diminished symmetric uptake within the left as compared to the right vocal cord. Right mandible increased activity in the area of third molar.  This is consistent with his recent tooth infection.  [facility].  Patient was offered for clinical trial with pembrolizumab/lenvatinib versus standard therapy. Patient received additional 1 cycle of Keytruda while waiting to be enrolled to the trial.   #Disease progression.  [facility] right upper paratracheal node is concerning for metastatic disease and a new from 01/20/2020 PET/CT.  The node closely abuts and possibly invade the posterolateral right tracheal wall as well as the esophagus.  Right vocal cord paralysis.  Symmetric soft tissue density at the right base of tongue could reflect residual/recurrent primary malignancy versus posttreatment changes.  Right supraclavicular lymph node is no longer appreciated with post treatment  changes.  There is increased size of right minor fissure nodule which was metabolically avid on previous PET scan.  New mediastinal and hilar lymphadenopathy concerning for new nodal disease.  No evidence of metastatic disease below the diaphragm.  Nonobstructing 3 mm distal ureter stone.  07/07/2020 started on palliative chemotherapy with docetaxel 09/02/2020 3 cycles of docetaxel  with G-CSF support.    CT scan done at [facility] which unfortunately showed progression of disease. Images are not available for me to review.  CT was compared to June 24, 2020 CT done at [facility]. 09/02/2020,chest without contrast with 3D MIPS protocol showed Interval increase in the size of the right upper lobe perifissural nodule 1.6 x 1.5 cm-previously 1.4 x 1.2 cm tracheoesophageal soft tissue mass 3.4 x 5.0 cm with regional mass-effect on the esophagus-previously 2.4 x 3.6 cm, and mediastinal lymphadenopathy-right upper paratracheal lymph node 1.9 cm-previously 1.5 cm, suspicious for progressive metastatic disease. Subcentimeter lung nodules are stable. CT neck with contrast showed slight interval increase in size of upper right paratracheal nodal conglomerate.  Questionable soft tissue invasion of the posterior tracheal appears worsened.  Compression with potential invasion of the esophagus is again noted.  Prior vocal cord paralysis again noted.  Unchanged right tongue base asymmetry. I had a discussion with patient's [facility] oncologist Dr. Barrington Ellison over the phone. Dr.Choe recommends adding carboplatin AUC 5 to docetaxel regimen for now which hopefully to help to reduce the size of the tracheoesophageal soft tissue mass.  Also recommend palliative radiation.   INTERVAL HISTORY Ivan Garcia. is a 64 y.o. male who presents for follow-up for metastatic squamous cell carcinoma, P 16+.   Patient is status post carboplatin and docetaxel 3 weeks ago.  S/p PEG tube placement on 09/23/2020. He follows up with dietitian for tube  feeding instruction.  Some soreness around the site of PEG tube, he take morphine liquid as instructed PRN. Constipation.  Starting RT today.   Review of Systems  Constitutional: Negative for chills, fever, malaise/fatigue and weight loss.  HENT: Negative for sore throat.        Odynophagia  Eyes: Negative for redness.  Respiratory: Negative for cough, shortness of breath and wheezing.   Cardiovascular: Negative for chest pain, palpitations and leg swelling.  Gastrointestinal: Negative for abdominal pain, blood in stool, nausea and vomiting.       PEG tube placement  Genitourinary: Negative for dysuria.  Musculoskeletal: Negative for myalgias.  Skin: Negative for rash.  Neurological: Negative for dizziness, tingling and tremors.  Endo/Heme/Allergies: Does not bruise/bleed easily.  Psychiatric/Behavioral: Negative for hallucinations.    MEDICAL HISTORY:  Past Medical History:  Diagnosis Date  . Arthritis   . Cancer ([place])    Head and neck cancer  . Hemorrhoids   . Hyperlipidemia   . Hypertension     SURGICAL HISTORY: Past Surgical History:  Procedure Laterality Date  . COLONOSCOPY WITH PROPOFOL N/A 04/04/2017   Procedure: COLONOSCOPY WITH PROPOFOL;  Surgeon: Robert Bellow, MD;  Location: [facility] ENDOSCOPY;  Service: Endoscopy;  Laterality: N/A;  . IR GASTROSTOMY TUBE MOD SED  09/24/2020  . MANDIBLE SURGERY    . PORT-A-CATH REMOVAL  07/17/2018  . PORTA CATH INSERTION N/A 12/19/2017   Procedure: PORTA CATH INSERTION;  Surgeon: Algernon Huxley, MD;  Location: [facility] CV LAB;  Service: Cardiovascular;  Laterality: N/A;  . PORTA CATH INSERTION N/A 07/17/2018   Procedure: PORTA CATH INSERTION;  Surgeon: Annice Needy, MD;  Location: [facility] INVASIVE CV LAB;  Service: Cardiovascular;  Laterality: N/A;  . PORTA CATH INSERTION N/A 09/18/2019   Procedure: PORTA CATH INSERTION;  Surgeon: Annice Needy, MD;  Location: [facility] INVASIVE CV LAB;  Service: Cardiovascular;  Laterality: N/A;  .  TONSILLECTOMY      SOCIAL HISTORY: Social History   Socioeconomic History  . Marital status: Single    Spouse name: Not on file  . Number of children: Not on file  . Years of education: Not on file  . Highest education level: Not on file  Occupational History  . Not on file  Tobacco Use  . Smoking status: Never Smoker  . Smokeless tobacco: Never Used  Vaping Use  . Vaping Use: Never used  Substance and Sexual Activity  . Alcohol use: No  . Drug use: No  . Sexual activity: Not on file  Other Topics Concern  . Not on file  Social History Narrative  . Not on file   Social Determinants of Health   Financial Resource Strain:   . Difficulty of Paying Living Expenses: Not on file  Food Insecurity:   . Worried About Programme researcher, broadcasting/film/video in the Last Year: Not on file  . Ran Out of Food in the Last Year: Not on file  Transportation Needs:   . Lack of Transportation (Medical): Not on file  . Lack of Transportation (Non-Medical): Not on file  Physical Activity:   . Days of Exercise per Week: Not on file  . Minutes of Exercise per Session: Not on file  Stress:   . Feeling of Stress : Not on file  Social Connections:   . Frequency of Communication with Friends and Family: Not on file  . Frequency of Social Gatherings with Friends and Family: Not on file  . Attends Religious Services: Not on file  . Active Member of Clubs or Organizations: Not on file  . Attends Banker Meetings: Not on file  . Marital Status: Not on file  Intimate Partner Violence:   . Fear of Current or Ex-Partner: Not on file  . Emotionally Abused: Not on file  . Physically Abused: Not on file  . Sexually Abused: Not on file    FAMILY HISTORY: Family History  Problem Relation Age of Onset  . Breast cancer Mother   . Asthma Mother   . Congestive Heart Failure Mother   . Prostate cancer Father   . Brain cancer Father   . Bladder Cancer Father     ALLERGIES:  has No Known  Allergies.  MEDICATIONS:  Current Outpatient Medications  Medication Sig Dispense Refill  . amLODipine (NORVASC) 5 MG tablet Take 5 mg by mouth daily.    Marland Kitchen dexamethasone (DECADRON) 1 MG tablet Take 1 mg by mouth 2 (two) times daily with a meal.    . lidocaine (XYLOCAINE) 2 % solution     . lidocaine-prilocaine (EMLA) cream Apply to affected area once 30 g 3  . morphine (ROXANOL) 20 MG/ML concentrated solution Take 1 mL (20 mg total) by mouth every 4 (four) hours as needed for moderate pain or severe pain. 30 mL 0  . nystatin (MYCOSTATIN) 100000 UNIT/ML suspension Take 5 mLs (500,000 Units total) by mouth in the morning, at noon, and at bedtime. SWISH AND SPIT 473 mL 0  . omeprazole (PRILOSEC) 20 MG capsule TAKE 1 CAPSULE BY MOUTH EVERY DAY (Patient taking differently: Take 20 mg by mouth daily as  needed. ) 90 capsule 1  . ondansetron (ZOFRAN) 8 MG tablet Take 1 tablet (8 mg total) by mouth 2 (two) times daily as needed for refractory nausea / vomiting. Start on day 3 after carboplatin chemo. 30 tablet 1  . prochlorperazine (COMPAZINE) 10 MG tablet Take 1 tablet (10 mg total) by mouth every 6 (six) hours as needed (Nausea or vomiting). 30 tablet 1  . rosuvastatin (CRESTOR) 10 MG tablet Take 10 mg by mouth every evening.  4  . DULoxetine (CYMBALTA) 30 MG capsule TAKE 1 CAPSULE BY MOUTH EVERY DAY (Patient not taking: Reported on 09/28/2020) 30 capsule 0  . Sennosides (SENNA) 8.8 MG/5ML LIQD Take 17.6 mg by mouth daily. 236 mL 1   No current facility-administered medications for this visit.   Facility-Administered Medications Ordered in Other Visits  Medication Dose Route Frequency Provider Last Rate Last Admin  . sodium chloride flush (NS) 0.9 % injection 10 mL  10 mL Intravenous PRN Earlie Server, MD         PHYSICAL EXAMINATION: ECOG PERFORMANCE STATUS: 1 - Symptomatic but completely ambulatory Vitals:   09/28/20 0926  BP: 119/81  Pulse: 90  Resp: 18  Temp: (!) 96.2 F (35.7 C)    Physical Exam Constitutional:      General: He is not in acute distress.    Appearance: He is not diaphoretic.  HENT:     Head: Normocephalic and atraumatic.     Nose: Nose normal.     Mouth/Throat:     Pharynx: No oropharyngeal exudate.  Eyes:     General: No scleral icterus.    Pupils: Pupils are equal, round, and reactive to light.  Cardiovascular:     Rate and Rhythm: Normal rate and regular rhythm.     Heart sounds: No murmur heard.   Pulmonary:     Effort: Pulmonary effort is normal. No respiratory distress.     Breath sounds: No rales.  Chest:     Chest wall: No tenderness.  Abdominal:     General: There is no distension.     Palpations: Abdomen is soft.     Tenderness: There is no abdominal tenderness.  Musculoskeletal:        General: Normal range of motion.     Cervical back: Normal range of motion and neck supple.  Skin:    General: Skin is warm and dry.     Findings: No erythema.  Neurological:     Mental Status: He is alert and oriented to person, place, and time.     Cranial Nerves: No cranial nerve deficit.     Motor: No abnormal muscle tone.     Coordination: Coordination normal.  Psychiatric:        Mood and Affect: Affect normal.      LABORATORY DATA:  I have reviewed the data as listed Lab Results  Component Value Date   WBC 6.8 09/28/2020   HGB 10.1 (L) 09/28/2020   HCT 31.3 (L) 09/28/2020   MCV 92.3 09/28/2020   PLT 298 09/28/2020   Recent Labs    08/17/20 0804 08/17/20 0804 08/23/20 1414 08/23/20 1414 08/31/20 1244 08/31/20 1244 09/07/20 0833 09/13/20 0833 09/28/20 0817  NA 139   < > 137   < > 137   < > 138 134* 138  K 4.0   < > 3.6   < > 4.5   < > 3.9 3.9 3.9  CL 105   < > 100   < >  98   < > 102 99 102  CO2 25   < > 26   < > 27   < > _0 GLUCOSE 101*   < > 129*   < > 134*   < > 124* 135* 117*  BUN 23   < > 21   < > 25*   < > _1 CREATININE 1.52*   < > 1.50*   < > 1.64*   < > 1.38* 1.30* 1.07  CALCIUM 9.2   <  > 8.9   < > 9.3   < > 9.3 9.2 9.0  GFRNONAA 47*   < > 48*   < > 43*   < > 53* 57* >60  GFRAA 55*  --  56*  --  50*  --   --   --   --   PROT 7.4   < > 7.0   < > 7.3   < > 7.3 6.7 6.5  ALBUMIN 3.5   < > 3.5   < > 3.5   < > 3.4* 3.5 3.2*  AST 19   < > 18   < > 20   < > _2 ALT 16   < > 16   < > 17   < > _3 ALKPHOS 82   < > 93   < > 103   < > 79 100 69  BILITOT 0.7   < > 0.8   < > 0.6   < > 0.9 0.8 0.7   < > = values in this interval not displayed.  RADIOGRAPHIC STUDIES: I have personally reviewed the radiological images as listed and agreed with the findings in the report. CT Abdomen Wo Contrast  Result Date: 09/16/2020 CLINICAL DATA:  64 year old with squamous cell carcinoma of the oropharynx. Evaluate anatomy for gastrostomy tube placement. EXAM: CT ABDOMEN WITHOUT CONTRAST TECHNIQUE: Multidetector CT imaging of the abdomen was performed following the standard protocol without IV contrast. COMPARISON:  PET-CT 04/26/2020 FINDINGS: Lower chest: Few densities at the lung bases likely represent atelectasis. Hepatobiliary: 1.3 cm hypodensity in the left hepatic lobe likely represents a cyst. Gallbladder is decompressed. No biliary dilatation. Pancreas: Unremarkable. No pancreatic ductal dilatation or surrounding inflammatory changes. Spleen: Normal in size without focal abnormality. Adrenals/Urinary Tract: Normal appearance of the adrenal glands. Large low-density cyst involving the right kidney upper pole measures up to 5.0 cm. Tiny stone in the right kidney without hydronephrosis. Probable left renal parapelvic cysts. No definite left hydronephrosis. Stomach/Bowel: Stomach is mildly distended with oral contrast. Normal appearance of the stomach. There are no structures located between the stomach in the anterior abdominal wall. Transverse colon is caudal to the stomach. Small amount of high-density material within the appendix without inflammatory changes. No evidence for bowel obstruction.  Vascular/Lymphatic: Atherosclerotic calcifications in the abdominal aorta without aneurysm. No significant lymph node enlargement in the abdomen. Other: Negative for ascites.  Negative for free air. Musculoskeletal: Degenerative facet disease in lower lumbar spine. IMPRESSION: 1. Anatomy is amendable for a percutaneous gastrostomy tube placement. 2. Evidence for bilateral renal cysts. 3.  Aortic Atherosclerosis (ICD10-I70.0). 4. Tiny nonobstructive right kidney stone. Electronically Signed   By: Markus Daft M.D.   On: 09/16/2020 15:07   IR GASTROSTOMY TUBE MOD SED  Result Date: 09/24/2020 INDICATION: History of head neck cancer. In need of percutaneous gastrostomy tube placement for enteric nutrition supplementation purposes. EXAM: PULL TROUGH GASTROSTOMY TUBE  PLACEMENT COMPARISON:  CT abdomen and pelvis-09/16/2020; neck CT-09/07/2020 MEDICATIONS: Ancef 2 gm IV; Antibiotics were administered within 1 hour of the procedure. Glucagon 1 mg IV CONTRAST:  20 cc Visipaque 320 administered into the gastric lumen. ANESTHESIA/SEDATION: Moderate (conscious) sedation was employed during this procedure. A total of Versed 4 mg and Fentanyl 100 mcg was administered intravenously. Moderate Sedation Time: 22 minutes. The patient's level of consciousness and vital signs were monitored continuously by radiology nursing throughout the procedure under my direct supervision. FLUOROSCOPY TIME:  7 minutes, 48 seconds (66.0 mGy) COMPLICATIONS: None immediate. PROCEDURE: Informed written consent was obtained from the patient following explanation of the procedure, risks, benefits and alternatives. A time out was performed prior to the initiation of the procedure. Ultrasound scanning was performed to demarcate the edge of the left lobe of the liver. Maximal barrier sterile technique utilized including caps, mask, sterile gowns, sterile gloves, large sterile drape, hand hygiene and Betadine prep. The left upper quadrant was sterilely  prepped and draped. An oral gastric catheter was inserted into the stomach under fluoroscopy. The existing nasogastric feeding tube was removed. The left costal margin and air opacified transverse colon were identified and avoided. Air was injected into the stomach for insufflation and visualization under fluoroscopy. Under sterile conditions a 17 gauge trocar needle was utilized to access the stomach percutaneously beneath the left subcostal margin after the overlying soft tissues were anesthetized with 1% Lidocaine with epinephrine. Needle position was confirmed within the stomach with aspiration of air and injection of small amount of contrast. A single T tack was deployed for gastropexy. Over an Amplatz guide wire, a 9-French sheath was inserted into the stomach. A snare device was utilized to capture the oral gastric catheter. The snare device was pulled retrograde from the stomach up the esophagus and out the oropharynx. The 20-French pull-through gastrostomy was connected to the snare device and pulled antegrade through the oropharynx down the esophagus into the stomach and then through the percutaneous tract external to the patient. The gastrostomy was assembled externally. Contrast injection confirms position in the stomach. Several spot radiographic images were obtained in various obliquities for documentation. The patient tolerated procedure well without immediate post procedural complication. FINDINGS: After successful fluoroscopic guided placement, the gastrostomy tube is appropriately positioned with internal disc against the ventral aspect of the gastric lumen. IMPRESSION: Successful fluoroscopic insertion of a 20-French pull-through gastrostomy tube. The gastrostomy may be used immediately for medication administration and in 24 hrs for the initiation of feeds. Electronically Signed   By: Sandi Mariscal M.D.   On: 09/24/2020 11:21     ASSESSMENT & PLAN:   1. Squamous cell carcinoma of oropharynx  ([place])   2. Dysphagia, unspecified type   3. Stage 3a chronic kidney disease ([place])   4. Normocytic anemia    #Metastatic squamous cell carcinoma of oropharynx- cervical lymphadenopathy, lung metastatic disease Patient tolerates carboplatin and docetaxel 3 weeks ago.  Start palliative RT today.   # Dysphagia, s/p PEG tube placement. Start tube feeding, following dietitian's instruction. Free water flush Check phosphorus, Magnesium level, watch for refeeding syndrome.  # Normocytic anemia, from chemotherapy. monitor # Tachycardia, resolved.  # CKD, Cr is improved.  # Neoplasm associated pain, Continue morphine 20 mg/ml every 4 hours as needed for moderate to severe pain.  Discussed about precautions while using narcotics.  #Patient is going to establish care with [facility] for evaluation of possible clinical trial. #His [place] also is applying for off  label use of Alpelisib given his PI K3 CA mutation.  Follow-up:2 weeks Earlie Server, MD, PhD Hematology Oncology [place] at [facility] 09/28/2020

## 2020-09-28 NOTE — Progress Notes (Signed)
Patient here for follow up. Pt reports that he has no problem with feeding tube and is feeling well. He does have soreness to site (LUQ).

## 2020-09-29 ENCOUNTER — Ambulatory Visit
Admission: RE | Admit: 2020-09-29 | Discharge: 2020-09-29 | Disposition: A | Discharge status: Home / Self Care | Payer: Medicare Other | Source: Ambulatory Visit | Attending: Radiation Oncology | Admitting: Radiation Oncology

## 2020-09-29 ENCOUNTER — Telehealth: Payer: Self-pay

## 2020-09-29 DIAGNOSIS — Z510 Encounter for antineoplastic radiation therapy: Secondary | ICD-10-CM | POA: Diagnosis not present

## 2020-09-29 NOTE — Telephone Encounter (Signed)
Nutrition  Patient called RD to say that infusion has been cancelled for tomorrow. RD was planning on meeting with patient during infusion.    Patient reports that he is tolerating [employer] [place] 1.4 via feeding tube well.  Today will be taking in 4 full cartons of tube feeding and 1/2 carton. Tomorrow will be at goal rate of 5 full cartons per day.  Reports received delivery of formula and supplies.  Tolerating feeding well without problems.  No questions at this time.    RD will cancel appointment for tomorrow.  Plan to follow-up with patient on 11/4 via phone.   Patient has RD contact information.   Ivan Garcia, [place], [place] Registered Dietitian [phone] (mobile)

## 2020-09-30 ENCOUNTER — Inpatient Hospital Stay: Payer: Medicare Other

## 2020-09-30 ENCOUNTER — Ambulatory Visit
Admission: RE | Admit: 2020-09-30 | Discharge: 2020-09-30 | Disposition: A | Discharge status: Home / Self Care | Payer: Medicare Other | Source: Ambulatory Visit | Attending: Radiation Oncology | Admitting: Radiation Oncology

## 2020-09-30 DIAGNOSIS — Z510 Encounter for antineoplastic radiation therapy: Secondary | ICD-10-CM | POA: Diagnosis not present

## 2020-10-01 ENCOUNTER — Ambulatory Visit
Admission: RE | Admit: 2020-10-01 | Discharge: 2020-10-01 | Disposition: A | Discharge status: Home / Self Care | Payer: Medicare Other | Source: Ambulatory Visit | Attending: Radiation Oncology | Admitting: Radiation Oncology

## 2020-10-01 DIAGNOSIS — Z510 Encounter for antineoplastic radiation therapy: Secondary | ICD-10-CM | POA: Diagnosis not present

## 2020-10-04 ENCOUNTER — Ambulatory Visit
Admission: RE | Admit: 2020-10-04 | Discharge: 2020-10-04 | Disposition: A | Discharge status: Home / Self Care | Payer: Medicare Other | Source: Ambulatory Visit | Attending: Radiation Oncology | Admitting: Radiation Oncology

## 2020-10-04 DIAGNOSIS — Z510 Encounter for antineoplastic radiation therapy: Secondary | ICD-10-CM | POA: Diagnosis present

## 2020-10-04 DIAGNOSIS — C01 Malignant neoplasm of base of tongue: Secondary | ICD-10-CM | POA: Diagnosis present

## 2020-10-05 ENCOUNTER — Ambulatory Visit
Admission: RE | Admit: 2020-10-05 | Discharge: 2020-10-05 | Disposition: A | Discharge status: Home / Self Care | Payer: Medicare Other | Source: Ambulatory Visit | Attending: Radiation Oncology | Admitting: Radiation Oncology

## 2020-10-05 DIAGNOSIS — Z510 Encounter for antineoplastic radiation therapy: Secondary | ICD-10-CM | POA: Diagnosis not present

## 2020-10-06 ENCOUNTER — Ambulatory Visit
Admission: RE | Admit: 2020-10-06 | Discharge: 2020-10-06 | Disposition: A | Discharge status: Home / Self Care | Payer: Medicare Other | Source: Ambulatory Visit | Attending: Radiation Oncology | Admitting: Radiation Oncology

## 2020-10-06 DIAGNOSIS — Z510 Encounter for antineoplastic radiation therapy: Secondary | ICD-10-CM | POA: Diagnosis not present

## 2020-10-07 ENCOUNTER — Inpatient Hospital Stay: Payer: Medicare Other | Attending: Oncology

## 2020-10-07 ENCOUNTER — Other Ambulatory Visit: Payer: Self-pay

## 2020-10-07 ENCOUNTER — Ambulatory Visit
Admission: RE | Admit: 2020-10-07 | Discharge: 2020-10-07 | Disposition: A | Discharge status: Home / Self Care | Payer: Medicare Other | Source: Ambulatory Visit | Attending: Radiation Oncology | Admitting: Radiation Oncology

## 2020-10-07 DIAGNOSIS — C7800 Secondary malignant neoplasm of unspecified lung: Secondary | ICD-10-CM | POA: Insufficient documentation

## 2020-10-07 DIAGNOSIS — C109 Malignant neoplasm of oropharynx, unspecified: Secondary | ICD-10-CM | POA: Insufficient documentation

## 2020-10-07 DIAGNOSIS — Z8042 Family history of malignant neoplasm of prostate: Secondary | ICD-10-CM | POA: Insufficient documentation

## 2020-10-07 DIAGNOSIS — Z931 Gastrostomy status: Secondary | ICD-10-CM | POA: Insufficient documentation

## 2020-10-07 DIAGNOSIS — Z510 Encounter for antineoplastic radiation therapy: Secondary | ICD-10-CM | POA: Diagnosis not present

## 2020-10-07 DIAGNOSIS — Z8052 Family history of malignant neoplasm of bladder: Secondary | ICD-10-CM | POA: Insufficient documentation

## 2020-10-07 DIAGNOSIS — N1831 Chronic kidney disease, stage 3a: Secondary | ICD-10-CM | POA: Insufficient documentation

## 2020-10-07 DIAGNOSIS — R1310 Dysphagia, unspecified: Secondary | ICD-10-CM | POA: Insufficient documentation

## 2020-10-07 DIAGNOSIS — R000 Tachycardia, unspecified: Secondary | ICD-10-CM | POA: Insufficient documentation

## 2020-10-07 DIAGNOSIS — G893 Neoplasm related pain (acute) (chronic): Secondary | ICD-10-CM | POA: Insufficient documentation

## 2020-10-07 NOTE — Progress Notes (Signed)
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  S/p PEG placement on 10/22.    Spoke with patient via phone.  Patient reports that he is tolerating 5 full cartons of [employer] 1.4. Giving 16ml of water before and at least 112ml after feeding.  Also drinking or giving via tube 16 oz bottle water daily.  Reports urinating well.  Reports had issue with constipation from morphine took miralax (last dose 4-5 days ago).  Having 4-5 bowel movements per day but small and not diarrhea.  Reports some nausea over the last few days. Has been taking nausea medications and thinks it is coming from radiation. Reports little bit of heart burn.  Otherwise tolerating tube feeding well.     Medications: reviewed  Labs: reviewed  Anthropometrics:   Weight 179 lb on 10/26   Estimated Energy Needs  Kcals: 2100-2500 Protein: 105-125 g Fluid: > 2.1 L  NUTRITION DIAGNOSIS: Inadequate oral intake continues but relying on feeding tube for nutrition   INTERVENTION:  Continue 5 full cartons of [employer] 1.4 via feeding tube (1 carton 5 times per day) with 60 ml water before and 120 ml after. Additional 16 bottle water either orally or via tube for hydration.  Provides 2275 calories, 100 g protein and 2570 ml free water.  Patient has contact number.    MONITORING, EVALUATION, GOAL: weight trends, intake, tube feeding   NEXT VISIT: Nov 11 phone f/u  Briseis Aguilera [name], [place], [place] Registered Dietitian [phone] (mobile)

## 2020-10-08 ENCOUNTER — Telehealth: Payer: Self-pay

## 2020-10-08 ENCOUNTER — Ambulatory Visit
Admission: RE | Admit: 2020-10-08 | Discharge: 2020-10-08 | Disposition: A | Discharge status: Home / Self Care | Payer: Medicare Other | Source: Ambulatory Visit | Attending: Radiation Oncology | Admitting: Radiation Oncology

## 2020-10-08 DIAGNOSIS — Z510 Encounter for antineoplastic radiation therapy: Secondary | ICD-10-CM | POA: Diagnosis not present

## 2020-10-08 NOTE — Telephone Encounter (Signed)
Nutrition  Received message from patient reported diarrhea and requested call back.    Called patient back this am around 8 am.  Reports yesterday afternoon feeding at 4 pm had watery diarrhea.  At 8 pm feeding only gave 1/2 carton of formula and did not have any more diarrhea.  Reports nausea this am and took nausea medication.    RD spoke with Dr Tasia Catchings via secure chat and wants to observe patient before starting any medication for diarrhea.  Called patient back with recommendations.     RD called patient back around 3:15pm and patient gave full feeding at 11am and 2pm and no diarrhea.  Reports that he has been giving feeding in about 5 minutes.    Encouraged patient to take 15-20 minutes to give 1 carton of tube feeding.  Slower the administration the less likely to cause problems (nausea, reflux, diarrhea).   Advised patient if has trouble over the weekend to call cancer center and speak with MD on call.  Patient verbalized understanding.  Appreciative of information.  Ivan Garcia [name], [place], [place] Registered Dietitian [phone] (mobile)

## 2020-10-11 ENCOUNTER — Ambulatory Visit
Admission: RE | Admit: 2020-10-11 | Discharge: 2020-10-11 | Disposition: A | Discharge status: Home / Self Care | Payer: Medicare Other | Source: Ambulatory Visit | Attending: Radiation Oncology | Admitting: Radiation Oncology

## 2020-10-11 DIAGNOSIS — Z510 Encounter for antineoplastic radiation therapy: Secondary | ICD-10-CM | POA: Diagnosis not present

## 2020-10-12 ENCOUNTER — Ambulatory Visit
Admission: RE | Admit: 2020-10-12 | Discharge: 2020-10-12 | Disposition: A | Discharge status: Home / Self Care | Payer: Medicare Other | Source: Ambulatory Visit | Attending: Radiation Oncology | Admitting: Radiation Oncology

## 2020-10-12 DIAGNOSIS — Z510 Encounter for antineoplastic radiation therapy: Secondary | ICD-10-CM | POA: Diagnosis not present

## 2020-10-13 ENCOUNTER — Inpatient Hospital Stay (HOSPITAL_BASED_OUTPATIENT_CLINIC_OR_DEPARTMENT_OTHER): Payer: Medicare Other | Admitting: Oncology

## 2020-10-13 ENCOUNTER — Inpatient Hospital Stay: Payer: Medicare Other | Admitting: Hospice and Palliative Medicine

## 2020-10-13 ENCOUNTER — Telehealth: Payer: Self-pay

## 2020-10-13 ENCOUNTER — Ambulatory Visit
Admission: RE | Admit: 2020-10-13 | Discharge: 2020-10-13 | Disposition: A | Discharge status: Home / Self Care | Payer: Medicare Other | Source: Ambulatory Visit | Attending: Radiation Oncology | Admitting: Radiation Oncology

## 2020-10-13 ENCOUNTER — Inpatient Hospital Stay: Payer: Medicare Other

## 2020-10-13 ENCOUNTER — Encounter: Payer: Self-pay | Admitting: Oncology

## 2020-10-13 VITALS — BP 119/79 | HR 115 | Temp 97.7°F | Resp 18 | Wt 179.0 lb

## 2020-10-13 DIAGNOSIS — C109 Malignant neoplasm of oropharynx, unspecified: Secondary | ICD-10-CM | POA: Diagnosis not present

## 2020-10-13 DIAGNOSIS — Z931 Gastrostomy status: Secondary | ICD-10-CM | POA: Diagnosis not present

## 2020-10-13 DIAGNOSIS — R1310 Dysphagia, unspecified: Secondary | ICD-10-CM

## 2020-10-13 DIAGNOSIS — R000 Tachycardia, unspecified: Secondary | ICD-10-CM | POA: Diagnosis not present

## 2020-10-13 DIAGNOSIS — Z8042 Family history of malignant neoplasm of prostate: Secondary | ICD-10-CM | POA: Diagnosis not present

## 2020-10-13 DIAGNOSIS — C7800 Secondary malignant neoplasm of unspecified lung: Secondary | ICD-10-CM | POA: Diagnosis not present

## 2020-10-13 DIAGNOSIS — Z8052 Family history of malignant neoplasm of bladder: Secondary | ICD-10-CM | POA: Diagnosis not present

## 2020-10-13 DIAGNOSIS — Z510 Encounter for antineoplastic radiation therapy: Secondary | ICD-10-CM | POA: Diagnosis not present

## 2020-10-13 DIAGNOSIS — N1831 Chronic kidney disease, stage 3a: Secondary | ICD-10-CM

## 2020-10-13 DIAGNOSIS — G893 Neoplasm related pain (acute) (chronic): Secondary | ICD-10-CM | POA: Diagnosis not present

## 2020-10-13 LAB — COMPREHENSIVE METABOLIC PANEL
ALT: 14 U/L (ref 0–44)
AST: 14 U/L — ABNORMAL LOW (ref 15–41)
Albumin: 3.3 g/dL — ABNORMAL LOW (ref 3.5–5.0)
Alkaline Phosphatase: 64 U/L (ref 38–126)
Anion gap: 8 (ref 5–15)
BUN: 22 mg/dL (ref 8–23)
CO2: 29 mmol/L (ref 22–32)
Calcium: 9.1 mg/dL (ref 8.9–10.3)
Chloride: 99 mmol/L (ref 98–111)
Creatinine, Ser: 1.14 mg/dL (ref 0.61–1.24)
GFR, Estimated: >60 mL/min (ref >60)
Glucose, Bld: 117 mg/dL — ABNORMAL HIGH (ref 70–99)
Potassium: 4.3 mmol/L (ref 3.5–5.1)
Sodium: 136 mmol/L (ref 135–145)
Total Bilirubin: 0.6 mg/dL (ref 0.3–1.2)
Total Protein: 6.7 g/dL (ref 6.5–8.1)

## 2020-10-13 LAB — CBC WITH DIFFERENTIAL/PLATELET
Abs Immature Granulocytes: 0.02 10*3/uL (ref 0.00–0.07)
Basophils Absolute: 0.1 10*3/uL (ref 0.0–0.1)
Basophils Relative: 1 %
Eosinophils Absolute: 0.2 10*3/uL (ref 0.0–0.5)
Eosinophils Relative: 2 %
HCT: 33.1 % — ABNORMAL LOW (ref 39.0–52.0)
Hemoglobin: 10.7 g/dL — ABNORMAL LOW (ref 13.0–17.0)
Immature Granulocytes: 0 %
Lymphocytes Relative: 4 %
Lymphs Abs: 0.3 10*3/uL — ABNORMAL LOW (ref 0.7–4.0)
MCH: 29.9 pg (ref 26.0–34.0)
MCHC: 32.3 g/dL (ref 30.0–36.0)
MCV: 92.5 fL (ref 80.0–100.0)
Monocytes Absolute: 0.4 10*3/uL (ref 0.1–1.0)
Monocytes Relative: 6 %
Neutro Abs: 6.3 10*3/uL (ref 1.7–7.7)
Neutrophils Relative %: 87 %
Platelets: 296 10*3/uL (ref 150–400)
RBC: 3.58 MIL/uL — ABNORMAL LOW (ref 4.22–5.81)
RDW: 16 % — ABNORMAL HIGH (ref 11.5–15.5)
WBC: 7.2 10*3/uL (ref 4.0–10.5)
nRBC: 0 % (ref 0.0–0.2)

## 2020-10-13 LAB — MAGNESIUM: Magnesium: 2.1 mg/dL (ref 1.7–2.4)

## 2020-10-13 LAB — PHOSPHORUS: Phosphorus: 3 mg/dL (ref 2.5–4.6)

## 2020-10-13 MED ORDER — HEPARIN SOD (PORK) LOCK FLUSH 100 UNIT/ML IV SOLN
500.0000 [IU] | Freq: Once | INTRAVENOUS | Status: AC
  Administered 2020-10-13: 500 [IU] via INTRAVENOUS
  Filled 2020-10-13: qty 5

## 2020-10-13 MED ORDER — SODIUM CHLORIDE 0.9% FLUSH
10.0000 mL | INTRAVENOUS | Status: AC | PRN
  Administered 2020-10-13: 10 mL via INTRAVENOUS
  Filled 2020-10-13: qty 10

## 2020-10-13 NOTE — Progress Notes (Signed)
Hematology/Oncology  Follow Up note [facility] Telephone:(336) [phone] Fax:([phone]   Patient Care Team: Albina Billet, MD as PCP - General (Internal Medicine) Albina Billet, MD (Internal Medicine) Bary Castilla, Forest Gleason, MD (General Surgery) Noreene Filbert, MD as Referring Physician (Radiation Oncology) Earlie Server, MD as Consulting Physician (Oncology)  CHIEF COMPLAINTS/PURPOSE OF CONSULTATION:  Follow up for head and neck cancer.  HISTORY OF PRESENTING ILLNESS:  Ivan Garcia. is a  64 y.o.  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 P16 positive. cT2 cN2 disease, stage II, # Concurrent ChemoRT  Cisplatin 100 mg/m2 q3weeks with concurrent RT which started on 12/31/2017.  S/p one dose of Cisplatin. Cisplatin discontinued due to nephrotoxicity. Switch to weekly Carboplatin (AUC 2) / Taxol 43m/m2 [ finished May 2019] Patient has finished concurrent chemoradiation in May 2019  # He reports doing well until in July 2020, he started to have hoarseness after singing for 2 to 3 hours. He has changed his insurance and Dr. MTami Ribasis no longer covered by his current insurance.  So he [facility]. Flexible laryngoscopy showed Vocal cord paralysis.  Patient was recommended to have a PET scan done for restaging. Patient's primary care provider Dr. THall Busingordered a PET scan for patient which was done on 08/20/2019. 08/20/2019 PET scan showed asymmetric hypermetabolic him involving the left vocal cords with hypermetabolic adenopathy in the neck, and the chest.  He also has hypermetabolic pulmonary nodule along the minor fissure which is felt to be metastatic. Tiny proximal left ureteral stone without hydronephrosis.  Possible gallbladder sludge.  Enlarged prostate.   Atherosclerosis.  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  # NGS showed PD-L1 CPS 20, TMB indeterminant, MSI stable,PIK3CA mutation Started on chemotherapy with 6 cycles of carboplatin, 5-FU and Keytruda, finished in February 2021.  And has been on Keytruda maintenance. Initially he had a good response, however progressed whiled on Keytruda maintenance.  PET 04/26/2020 worsening of disease in the chest.  Interval enlargement of the right thoracic inlet and paratracheal lymph nodes that were previously enlarged with new activity in the right hilum corresponding to a lymph node in this location.  Also increased activity in the right upper lobe nodule. Right level 3 lymph node in the neck and anterior mediastinal nodal activity is diminished.  These areas are quite small on prior study. Diminished symmetric uptake within the left as compared to the right vocal cord. Right mandible increased activity in the area of third molar.  This is consistent with his recent tooth infection.  [facility].  Patient was offered for clinical trial with pembrolizumab/lenvatinib versus standard therapy. Patient received additional 1 cycle of Keytruda while waiting to be enrolled to the trial.   #Disease progression.  [facility] right upper paratracheal node is concerning for metastatic disease and a new from 01/20/2020 PET/CT.  The node closely abuts and possibly invade the posterolateral right tracheal wall as well as the esophagus.  Right vocal cord paralysis.  Symmetric soft tissue density at the right base of tongue could reflect residual/recurrent primary malignancy versus posttreatment changes.  Right supraclavicular lymph node is no longer appreciated with post treatment  changes.  There is increased size of right minor fissure nodule which was metabolically avid on previous PET scan.  New mediastinal and hilar lymphadenopathy concerning for new nodal disease.  No evidence of metastatic disease below the diaphragm.  Nonobstructing 3 mm distal ureter stone.  07/07/2020 started on palliative chemotherapy with docetaxel 09/02/2020 3 cycles of docetaxel  with G-CSF support.    CT scan done at [facility] which unfortunately showed progression of disease. Images are not available for me to review.  CT was compared to June 24, 2020 CT done at [facility]. 09/02/2020,chest without contrast with 3D MIPS protocol showed Interval increase in the size of the right upper lobe perifissural nodule 1.6 x 1.5 cm-previously 1.4 x 1.2 cm tracheoesophageal soft tissue mass 3.4 x 5.0 cm with regional mass-effect on the esophagus-previously 2.4 x 3.6 cm, and mediastinal lymphadenopathy-right upper paratracheal lymph node 1.9 cm-previously 1.5 cm, suspicious for progressive metastatic disease. Subcentimeter lung nodules are stable. CT neck with contrast showed slight interval increase in size of upper right paratracheal nodal conglomerate.  Questionable soft tissue invasion of the posterior tracheal appears worsened.  Compression with potential invasion of the esophagus is again noted.  Prior vocal cord paralysis again noted.  Unchanged right tongue base asymmetry. I had a discussion with patient's [facility] oncologist Dr. Barrington Ellison over the phone. Dr.Choe recommends adding carboplatin AUC 5 to docetaxel regimen for now which hopefully to help to reduce the size of the tracheoesophageal soft tissue mass.  Also recommend palliative radiation. 09/07/2020, carboplatin AUC 5+ docetaxel S/p PEG tube placement on 09/23/2020 #09/28/2020, started on palliative radiation to esophageal   INTERVAL HISTORY Ivan Garcia. is a 64 y.o. male who presents for follow-up for metastatic squamous cell carcinoma, P 16+.   Patient  reports that he has another 2 weeks radiation left.  He has establish care with [facility] and liquid biopsy was done.  Results are pending.  There is a possibility that he may be enrolled into a phase 1 study at [facility]. Also patient has received Piqray and Dr. Barrington Ellison advised him to hold the treatment as he may be enrolled in the clinical trial. Patient reports constant secretion in his throat he needs to spit out.  Secretions are clear.  Chronic cough.  No shortness of breath.  No fever or chills.  Denies any chest pain today.   Review of Systems  Constitutional: Negative for chills, fever, malaise/fatigue and weight loss.  HENT: Negative for sore throat.        Odynophagia  Eyes: Negative for redness.  Respiratory: Negative for cough, shortness of breath and wheezing.   Cardiovascular: Negative for chest pain, palpitations and leg swelling.  Gastrointestinal: Negative for abdominal pain, blood in stool, nausea and vomiting.       PEG tube placement  Genitourinary: Negative for dysuria.  Musculoskeletal: Negative for myalgias.  Skin: Negative for rash.  Neurological: Negative for dizziness, tingling and tremors.  Endo/Heme/Allergies: Does not bruise/bleed easily.  Psychiatric/Behavioral: Negative for hallucinations.    MEDICAL HISTORY:  Past Medical History:  Diagnosis Date  . Arthritis   . Cancer ([place])    Head and neck cancer  . Hemorrhoids   . Hyperlipidemia   . Hypertension     SURGICAL HISTORY: Past Surgical History:  Procedure Laterality Date  . COLONOSCOPY WITH PROPOFOL N/A 04/04/2017   Procedure: COLONOSCOPY WITH PROPOFOL;  Surgeon: Robert Bellow, MD;  Location:  [facility];  Service: Endoscopy;  Laterality: N/A;  . IR GASTROSTOMY TUBE MOD SED  09/24/2020  . MANDIBLE SURGERY    . PORT-A-CATH REMOVAL  07/17/2018  . PORTA CATH INSERTION N/A 12/19/2017   Procedure: PORTA CATH INSERTION;  Surgeon: Algernon Huxley, MD;  Location: [place] [facility];  Service:  Cardiovascular;  Laterality: N/A;  . PORTA CATH INSERTION N/A 07/17/2018   Procedure: PORTA CATH INSERTION;  Surgeon: Algernon Huxley, MD;  Location: [place] [facility];  Service: Cardiovascular;  Laterality: N/A;  . PORTA CATH INSERTION N/A 09/18/2019   Procedure: PORTA CATH INSERTION;  Surgeon: Algernon Huxley, MD;  Location: [place] [facility];  Service: Cardiovascular;  Laterality: N/A;  . TONSILLECTOMY      SOCIAL HISTORY: Social History   Socioeconomic History  . Marital status: Single    Spouse name: Not on file  . Number of children: Not on file  . Years of education: Not on file  . Highest education level: Not on file  Occupational History  . Not on file  Tobacco Use  . Smoking status: Never Smoker  . Smokeless tobacco: Never Used  Vaping Use  . Vaping Use: Never used  Substance and Sexual Activity  . Alcohol use: No  . Drug use: No  . Sexual activity: Not on file  Other Topics Concern  . Not on file  Social History Narrative  . Not on file   Social Determinants of Health   Financial Resource Strain:   . Difficulty of Paying Living Expenses: Not on file  Food Insecurity:   . Worried About Charity fundraiser in the Last Year: Not on file  . Ran Out of Food in the Last Year: Not on file  Transportation Needs:   . Lack of Transportation (Medical): Not on file  . Lack of Transportation (Non-Medical): Not on file  Physical Activity:   . Days of Exercise per Week: Not on file  . Minutes of Exercise per Session: Not on file  Stress:   . Feeling of Stress : Not on file  Social Connections:   . Frequency of Communication with Friends and Family: Not on file  . Frequency of Social Gatherings with Friends and Family: Not on file  . Attends Religious Services: Not on file  . Active Member of Clubs or Organizations: Not on file  . Attends Archivist Meetings: Not on file  . Marital Status: Not on file  Intimate Partner Violence:   . Fear of Current or  Ex-Partner: Not on file  . Emotionally Abused: Not on file  . Physically Abused: Not on file  . Sexually Abused: Not on file    FAMILY HISTORY: Family History  Problem Relation Age of Onset  . Breast cancer Mother   . Asthma Mother   . Congestive Heart Failure Mother   . Prostate cancer Father   . Brain cancer Father   . Bladder Cancer Father     ALLERGIES:  has No Known Allergies.  MEDICATIONS:  Current Outpatient Medications  Medication Sig Dispense Refill  . amLODipine (NORVASC) 5 MG tablet Take 5 mg by mouth daily.    Marland Kitchen lidocaine (XYLOCAINE) 2 % solution     . lidocaine-prilocaine (EMLA) cream Apply to affected area once 30 g 3  . morphine (ROXANOL) 20 MG/ML concentrated solution Take 1 mL (20 mg total) by mouth every 4 (four) hours as needed for moderate pain or severe pain. 30 mL 0  .  nystatin (MYCOSTATIN) 100000 UNIT/ML suspension Take 5 mLs (500,000 Units total) by mouth in the morning, at noon, and at bedtime. SWISH AND SPIT 473 mL 0  . omeprazole (PRILOSEC) 20 MG capsule TAKE 1 CAPSULE BY MOUTH EVERY DAY (Patient taking differently: Take 20 mg by mouth daily as needed. ) 90 capsule 1  . ondansetron (ZOFRAN) 8 MG tablet Take 1 tablet (8 mg total) by mouth 2 (two) times daily as needed for refractory nausea / vomiting. Start on day 3 after carboplatin chemo. 30 tablet 1  . prochlorperazine (COMPAZINE) 10 MG tablet Take 1 tablet (10 mg total) by mouth every 6 (six) hours as needed (Nausea or vomiting). 30 tablet 1  . rosuvastatin (CRESTOR) 10 MG tablet Take 10 mg by mouth every evening.  4  . Sennosides (SENNA) 8.8 MG/5ML LIQD Take 17.6 mg by mouth daily. 236 mL 1  . dexamethasone (DECADRON) 1 MG tablet Take 1 mg by mouth 2 (two) times daily with a meal. (Patient not taking: Reported on 10/13/2020)     No current facility-administered medications for this visit.   Facility-Administered Medications Ordered in Other Visits  Medication Dose Route Frequency Provider Last Rate  Last Admin  . sodium chloride flush (NS) 0.9 % injection 10 mL  10 mL Intravenous PRN Earlie Server, MD      . sodium chloride flush (NS) 0.9 % injection 10 mL  10 mL Intravenous PRN Earlie Server, MD   10 mL at 10/13/20 1034     PHYSICAL EXAMINATION: ECOG PERFORMANCE STATUS: 1 - Symptomatic but completely ambulatory Vitals:   10/13/20 1056  BP: 119/79  Pulse: (!) 115  Resp: 18  Temp: 97.7 F (36.5 C)  SpO2: 98%   Physical Exam Constitutional:      General: He is not in acute distress.    Appearance: He is not diaphoretic.  HENT:     Head: Normocephalic and atraumatic.     Nose: Nose normal.     Mouth/Throat:     Pharynx: No oropharyngeal exudate.  Eyes:     General: No scleral icterus.    Pupils: Pupils are equal, round, and reactive to light.  Cardiovascular:     Rate and Rhythm: Regular rhythm. Tachycardia present.     Heart sounds: No murmur heard.      Comments: Tachycardia with heart rate about 100 bpm Pulmonary:     Effort: Pulmonary effort is normal. No respiratory distress.     Breath sounds: No rales.  Chest:     Chest wall: No tenderness.  Abdominal:     General: There is no distension.     Palpations: Abdomen is soft.     Tenderness: There is no abdominal tenderness.  Musculoskeletal:        General: Normal range of motion.     Cervical back: Normal range of motion and neck supple.  Skin:    General: Skin is warm and dry.     Findings: No erythema.  Neurological:     Mental Status: He is alert and oriented to person, place, and time.     Cranial Nerves: No cranial nerve deficit.     Motor: No abnormal muscle tone.     Coordination: Coordination normal.  Psychiatric:        Mood and Affect: Affect normal.      LABORATORY DATA:  I have reviewed the data as listed Lab Results  Component Value Date   WBC 7.2 10/13/2020   HGB 10.7 (L)  10/13/2020   HCT 33.1 (L) 10/13/2020   MCV 92.5 10/13/2020   PLT 296 10/13/2020   Recent Labs    08/17/20 0804  08/17/20 0804 08/23/20 1414 08/23/20 1414 08/31/20 1244 09/07/20 0833 09/13/20 0833 09/28/20 0817 10/13/20 1024  NA 139   < > 137   < > 137   < > 134* 138 136  K 4.0   < > 3.6   < > 4.5   < > 3.9 3.9 4.3  CL 105   < > 100   < > 98   < > 99 102 99  CO2 25   < > 26   < > 27   < > $R'26 28 29  'KN$ GLUCOSE 101*   < > 129*   < > 134*   < > 135* 117* 117*  BUN 23   < > 21   < > 25*   < > $R'17 16 22  'MV$ CREATININE 1.52*   < > 1.50*   < > 1.64*   < > 1.30* 1.07 1.14  CALCIUM 9.2   < > 8.9   < > 9.3   < > 9.2 9.0 9.1  GFRNONAA 47*   < > 48*   < > 43*   < > 57* >60 >60  GFRAA 55*  --  56*  --  50*  --   --   --   --   PROT 7.4   < > 7.0   < > 7.3   < > 6.7 6.5 6.7  ALBUMIN 3.5   < > 3.5   < > 3.5   < > 3.5 3.2* 3.3*  AST 19   < > 18   < > 20   < > 21 21 14*  ALT 16   < > 16   < > 17   < > $R'17 13 14  'zw$ ALKPHOS 82   < > 93   < > 103   < > 100 69 64  BILITOT 0.7   < > 0.8   < > 0.6   < > 0.8 0.7 0.6   < > = values in this interval not displayed.  RADIOGRAPHIC STUDIES: I have personally reviewed the radiological images as listed and agreed with the findings in the report. CT Abdomen Wo Contrast  Result Date: 09/16/2020 CLINICAL DATA:  64 year old with squamous cell carcinoma of the oropharynx. Evaluate anatomy for gastrostomy tube placement. EXAM: CT ABDOMEN WITHOUT CONTRAST TECHNIQUE: Multidetector CT imaging of the abdomen was performed following the standard protocol without IV contrast. COMPARISON:  PET-CT 04/26/2020 FINDINGS: Lower chest: Few densities at the lung bases likely represent atelectasis. Hepatobiliary: 1.3 cm hypodensity in the left hepatic lobe likely represents a cyst. Gallbladder is decompressed. No biliary dilatation. Pancreas: Unremarkable. No pancreatic ductal dilatation or surrounding inflammatory changes. Spleen: Normal in size without focal abnormality. Adrenals/Urinary Tract: Normal appearance of the adrenal glands. Large low-density cyst involving the right kidney upper pole measures up  to 5.0 cm. Tiny stone in the right kidney without hydronephrosis. Probable left renal parapelvic cysts. No definite left hydronephrosis. Stomach/Bowel: Stomach is mildly distended with oral contrast. Normal appearance of the stomach. There are no structures located between the stomach in the anterior abdominal wall. Transverse colon is caudal to the stomach. Small amount of high-density material within the appendix without inflammatory changes. No evidence for bowel obstruction. Vascular/Lymphatic: Atherosclerotic calcifications in the abdominal aorta without aneurysm. No significant lymph node enlargement in the  abdomen. Other: Negative for ascites.  Negative for free air. Musculoskeletal: Degenerative facet disease in lower lumbar spine. IMPRESSION: 1. Anatomy is amendable for a percutaneous gastrostomy tube placement. 2. Evidence for bilateral renal cysts. 3.  Aortic Atherosclerosis (ICD10-I70.0). 4. Tiny nonobstructive right kidney stone. Electronically Signed   By: Richarda Overlie M.D.   On: 09/16/2020 15:07   IR GASTROSTOMY TUBE MOD SED  Result Date: 09/24/2020 INDICATION: History of head neck cancer. In need of percutaneous gastrostomy tube placement for enteric nutrition supplementation purposes. EXAM: PULL TROUGH GASTROSTOMY TUBE PLACEMENT COMPARISON:  CT abdomen and pelvis-09/16/2020; neck CT-09/07/2020 MEDICATIONS: Ancef 2 gm IV; Antibiotics were administered within 1 hour of the procedure. Glucagon 1 mg IV CONTRAST:  20 cc Visipaque 320 administered into the gastric lumen. ANESTHESIA/SEDATION: Moderate (conscious) sedation was employed during this procedure. A total of Versed 4 mg and Fentanyl 100 mcg was administered intravenously. Moderate Sedation Time: 22 minutes. The patient's level of consciousness and vital signs were monitored continuously by radiology nursing throughout the procedure under my direct supervision. FLUOROSCOPY TIME:  7 minutes, 48 seconds (94.8 mGy) COMPLICATIONS: None immediate.  PROCEDURE: Informed written consent was obtained from the patient following explanation of the procedure, risks, benefits and alternatives. A time out was performed prior to the initiation of the procedure. Ultrasound scanning was performed to demarcate the edge of the left lobe of the liver. Maximal barrier sterile technique utilized including caps, mask, sterile gowns, sterile gloves, large sterile drape, hand hygiene and Betadine prep. The left upper quadrant was sterilely prepped and draped. An oral gastric catheter was inserted into the stomach under fluoroscopy. The existing nasogastric feeding tube was removed. The left costal margin and air opacified transverse colon were identified and avoided. Air was injected into the stomach for insufflation and visualization under fluoroscopy. Under sterile conditions a 17 gauge trocar needle was utilized to access the stomach percutaneously beneath the left subcostal margin after the overlying soft tissues were anesthetized with 1% Lidocaine with epinephrine. Needle position was confirmed within the stomach with aspiration of air and injection of small amount of contrast. A single T tack was deployed for gastropexy. Over an Amplatz guide wire, a 9-French sheath was inserted into the stomach. A snare device was utilized to capture the oral gastric catheter. The snare device was pulled retrograde from the stomach up the esophagus and out the oropharynx. The 20-French pull-through gastrostomy was connected to the snare device and pulled antegrade through the oropharynx down the esophagus into the stomach and then through the percutaneous tract external to the patient. The gastrostomy was assembled externally. Contrast injection confirms position in the stomach. Several spot radiographic images were obtained in various obliquities for documentation. The patient tolerated procedure well without immediate post procedural complication. FINDINGS: After successful fluoroscopic  guided placement, the gastrostomy tube is appropriately positioned with internal disc against the ventral aspect of the gastric lumen. IMPRESSION: Successful fluoroscopic insertion of a 20-French pull-through gastrostomy tube. The gastrostomy may be used immediately for medication administration and in 24 hrs for the initiation of feeds. Electronically Signed   By: Simonne Come M.D.   On: 09/24/2020 11:21     ASSESSMENT & PLAN:   1. 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HCC)   2. Dysphagia, unspecified type   3. Stage 3a chronic kidney disease (HCC)   4. 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  Continue palliative radiation.  Is currently awaiting decision of clinical trial involvement at [facility]. Patient thought his virtual visit was yesterday with Dr. Barrington Ellison.  I communicated with Dr. Barrington Ellison and his appointment is actually today.   # Dysphagia, s/p PEG tube placement.  Continue tube feeding.  #Tachycardia, likely due to hypovolemia.  Encourage patient to increase free water flush. I recommend patient to have IV fluid session and he declined. .  # CKD, creatinine is stable # Neoplasm associated pain, continue current regimen.  Follow-up:3 weeks Earlie Server, MD, PhD Hematology Oncology [place] at [facility] 10/13/2020

## 2020-10-13 NOTE — Telephone Encounter (Signed)
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  S/p PEG placement on 10/22.    Spoke with patient this pm for nutrition follow-up.  Patient reports that he is doing well with [place] 1.4, 5 cartons per day.  Flushing with 19ml of water before and 137ml after each feeding.  Reports no diarrhea or constipation.  Reports trouble swallowing from radiation treatments. Unable to swallow water at this time.  Coughing during phone visit.      Medications: reviewed  Labs: Mag and Phos WNL  Anthropometrics:   Weight 179 lb, stable   Estimated Energy Needs  Kcals: 2100-2500 Protein: 105-125 g Fluid: > 2.1 L  NUTRITION DIAGNOSIS: Inadequate oral intake continues relying on feeding tube   INTERVENTION:  Continue 5 full cartons of [employer] 1.4 via feeding tube (1 carton 5 times per day) with 66ml of water before and 165ml after.  Will provide at least 16 oz bottle water via feeding tube to better meet hydration needs as unable to take water, soups by mouth.  Patient verbalized understanding. Patient has contact information.    MONITORING, EVALUATION, GOAL: weight trends, intake, tube feeding   NEXT VISIT: Nov 18 face to face at radiation appointment  Kemal Amores [name], [place], [place] Registered Dietitian [phone] (mobile)

## 2020-10-13 NOTE — Progress Notes (Signed)
Pt here for follow up. Pt reports that he has been trouble swallowing due to radiation. Pt does not think he needs IVF today.

## 2020-10-14 ENCOUNTER — Inpatient Hospital Stay: Payer: Medicare Other

## 2020-10-14 ENCOUNTER — Ambulatory Visit
Admission: RE | Admit: 2020-10-14 | Discharge: 2020-10-14 | Disposition: A | Discharge status: Home / Self Care | Payer: Medicare Other | Source: Ambulatory Visit | Attending: Radiation Oncology | Admitting: Radiation Oncology

## 2020-10-14 DIAGNOSIS — Z510 Encounter for antineoplastic radiation therapy: Secondary | ICD-10-CM | POA: Diagnosis not present

## 2020-10-15 ENCOUNTER — Telehealth: Payer: Self-pay

## 2020-10-15 ENCOUNTER — Ambulatory Visit
Admission: RE | Admit: 2020-10-15 | Discharge: 2020-10-15 | Disposition: A | Discharge status: Home / Self Care | Payer: Medicare Other | Source: Ambulatory Visit | Attending: Radiation Oncology | Admitting: Radiation Oncology

## 2020-10-15 DIAGNOSIS — Z510 Encounter for antineoplastic radiation therapy: Secondary | ICD-10-CM | POA: Diagnosis not present

## 2020-10-15 NOTE — Telephone Encounter (Signed)
Nutrition  Received call from patient letting RD know that he just ate 5 pieces of pizza.  Wanting to know if he should cut back on tube feeding.  Reports that he took small bites and chewed slowly and was able to swallow pizza without difficulty.   Discussed with patient how to taper down tube feeding gradually as he is able to eat more orally.  Also discussed how patient can time tube feeding for between meals.  Patient verbalized understanding. Reviewed with patient if decides not to use feeding tube at all must flush tube with at least 47ml of water daily to keep patent.  Reviewed importance of chewing foods well. Trying soft, moist foods for ease of swallowing.   Patient knows to contact RD with questions.   Holdyn Poyser [name], [place], [place] Registered Dietitian [phone] (mobile)

## 2020-10-18 ENCOUNTER — Ambulatory Visit
Admission: RE | Admit: 2020-10-18 | Discharge: 2020-10-18 | Disposition: A | Discharge status: Home / Self Care | Payer: Medicare Other | Source: Ambulatory Visit | Attending: Radiation Oncology | Admitting: Radiation Oncology

## 2020-10-18 DIAGNOSIS — Z510 Encounter for antineoplastic radiation therapy: Secondary | ICD-10-CM | POA: Diagnosis not present

## 2020-10-19 ENCOUNTER — Ambulatory Visit
Admission: RE | Admit: 2020-10-19 | Discharge: 2020-10-19 | Disposition: A | Discharge status: Home / Self Care | Payer: Medicare Other | Source: Ambulatory Visit | Attending: Radiation Oncology | Admitting: Radiation Oncology

## 2020-10-19 DIAGNOSIS — Z510 Encounter for antineoplastic radiation therapy: Secondary | ICD-10-CM | POA: Diagnosis not present

## 2020-10-20 ENCOUNTER — Ambulatory Visit
Admission: RE | Admit: 2020-10-20 | Discharge: 2020-10-20 | Disposition: A | Discharge status: Home / Self Care | Payer: Medicare Other | Source: Ambulatory Visit | Attending: Radiation Oncology | Admitting: Radiation Oncology

## 2020-10-20 DIAGNOSIS — Z510 Encounter for antineoplastic radiation therapy: Secondary | ICD-10-CM | POA: Diagnosis not present

## 2020-10-21 ENCOUNTER — Ambulatory Visit
Admission: RE | Admit: 2020-10-21 | Discharge: 2020-10-21 | Disposition: A | Discharge status: Home / Self Care | Payer: Medicare Other | Source: Ambulatory Visit | Attending: Radiation Oncology | Admitting: Radiation Oncology

## 2020-10-21 ENCOUNTER — Other Ambulatory Visit: Payer: Self-pay

## 2020-10-21 ENCOUNTER — Inpatient Hospital Stay: Payer: Medicare Other

## 2020-10-21 DIAGNOSIS — Z510 Encounter for antineoplastic radiation therapy: Secondary | ICD-10-CM | POA: Diagnosis not present

## 2020-10-21 NOTE — Progress Notes (Signed)
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  S/p PEG placement on 10/22.   Met with patient prior to radiation. Final radiation treatment due on 11/22.  Patient reports that he has been using feeding tube 5 cartons of [employer] 1.4 if not eating orally or just few bites or 4 cartons daily if eating 1 meal orally.  Tolerating well. Some nausea but takes medication and symptoms resolve.  Bowels are moving normally, 1 time daily. Patient has drank soup, yogurt, pizza (chewed well), cheesecake orally. Reports that he does not cough during eating but sometimes afterwards.  Has been drinking water, milk.   Patient evaluated by SLP on 9/28 via phone.   Medications: reviewed  Labs: no new  Anthropometrics:   Weight 178 lb 8 oz today slight decrease from 179 lb on 11/10   Estimated Energy Needs  Kcals: 2100-2500 Protein: 105-125 g Fluid: > 2.1 L  NUTRITION DIAGNOSIS: Inadequate oral intake but relying on feeding tube for majority of nutrition   INTERVENTION:  Patient to continue [employer] 1.4 4-5 times per day pending what he is able to eat orally.  Flush with 54m of water before and 1221mwater after.   Continue to consume foods orally as able.  RD concerned about safe swallowing of patient and patient agreeable to speaking with SLP again.  RD to make referral.  Patient has contact information    MONITORING, EVALUATION, GOAL: weight trends, intake, tube feeding   NEXT VISIT: Dec 2 in clinic  JoManor CreekAlZenia ResidesRDRufusLDRoanokeegistered Dietitian [phone])

## 2020-10-22 ENCOUNTER — Ambulatory Visit
Admission: RE | Admit: 2020-10-22 | Discharge: 2020-10-22 | Disposition: A | Discharge status: Home / Self Care | Payer: Medicare Other | Source: Ambulatory Visit | Attending: Radiation Oncology | Admitting: Radiation Oncology

## 2020-10-22 DIAGNOSIS — Z510 Encounter for antineoplastic radiation therapy: Secondary | ICD-10-CM | POA: Diagnosis not present

## 2020-10-25 ENCOUNTER — Ambulatory Visit
Admission: RE | Admit: 2020-10-25 | Discharge: 2020-10-25 | Disposition: A | Discharge status: Home / Self Care | Payer: Medicare Other | Source: Ambulatory Visit | Attending: Radiation Oncology | Admitting: Radiation Oncology

## 2020-10-25 DIAGNOSIS — Z510 Encounter for antineoplastic radiation therapy: Secondary | ICD-10-CM | POA: Diagnosis not present

## 2020-10-26 ENCOUNTER — Ambulatory Visit: Payer: Medicare Other

## 2020-10-27 ENCOUNTER — Ambulatory Visit: Payer: Medicare Other

## 2020-11-01 ENCOUNTER — Ambulatory Visit: Payer: Medicare Other

## 2020-11-02 ENCOUNTER — Ambulatory Visit: Payer: Medicare Other

## 2020-11-03 ENCOUNTER — Ambulatory Visit: Payer: Medicare Other

## 2020-11-04 ENCOUNTER — Inpatient Hospital Stay: Payer: Medicare Other | Admitting: Hospice and Palliative Medicine

## 2020-11-04 ENCOUNTER — Inpatient Hospital Stay: Payer: Medicare Other | Attending: Oncology

## 2020-11-04 ENCOUNTER — Ambulatory Visit: Payer: Medicare Other

## 2020-11-04 ENCOUNTER — Other Ambulatory Visit: Payer: Self-pay | Admitting: Oncology

## 2020-11-04 ENCOUNTER — Encounter: Payer: Self-pay | Admitting: Oncology

## 2020-11-04 ENCOUNTER — Inpatient Hospital Stay: Payer: Medicare Other

## 2020-11-04 ENCOUNTER — Inpatient Hospital Stay (HOSPITAL_BASED_OUTPATIENT_CLINIC_OR_DEPARTMENT_OTHER): Payer: Medicare Other | Admitting: Oncology

## 2020-11-04 ENCOUNTER — Other Ambulatory Visit: Payer: Self-pay

## 2020-11-04 VITALS — BP 119/91 | HR 103 | Temp 97.1°F | Resp 16 | Wt 178.8 lb

## 2020-11-04 DIAGNOSIS — N1831 Chronic kidney disease, stage 3a: Secondary | ICD-10-CM | POA: Diagnosis not present

## 2020-11-04 DIAGNOSIS — C109 Malignant neoplasm of oropharynx, unspecified: Secondary | ICD-10-CM

## 2020-11-04 DIAGNOSIS — R000 Tachycardia, unspecified: Secondary | ICD-10-CM

## 2020-11-04 DIAGNOSIS — T451X5A Adverse effect of antineoplastic and immunosuppressive drugs, initial encounter: Secondary | ICD-10-CM | POA: Diagnosis not present

## 2020-11-04 DIAGNOSIS — G893 Neoplasm related pain (acute) (chronic): Secondary | ICD-10-CM | POA: Insufficient documentation

## 2020-11-04 DIAGNOSIS — R1310 Dysphagia, unspecified: Secondary | ICD-10-CM | POA: Insufficient documentation

## 2020-11-04 DIAGNOSIS — Z95828 Presence of other vascular implants and grafts: Secondary | ICD-10-CM

## 2020-11-04 DIAGNOSIS — Z931 Gastrostomy status: Secondary | ICD-10-CM | POA: Diagnosis not present

## 2020-11-04 DIAGNOSIS — G620 Drug-induced polyneuropathy: Secondary | ICD-10-CM

## 2020-11-04 DIAGNOSIS — Z8042 Family history of malignant neoplasm of prostate: Secondary | ICD-10-CM | POA: Diagnosis not present

## 2020-11-04 DIAGNOSIS — C7800 Secondary malignant neoplasm of unspecified lung: Secondary | ICD-10-CM | POA: Insufficient documentation

## 2020-11-04 DIAGNOSIS — Z803 Family history of malignant neoplasm of breast: Secondary | ICD-10-CM | POA: Diagnosis not present

## 2020-11-04 DIAGNOSIS — Z808 Family history of malignant neoplasm of other organs or systems: Secondary | ICD-10-CM | POA: Insufficient documentation

## 2020-11-04 DIAGNOSIS — Z8052 Family history of malignant neoplasm of bladder: Secondary | ICD-10-CM | POA: Insufficient documentation

## 2020-11-04 LAB — COMPREHENSIVE METABOLIC PANEL
ALT: 18 U/L (ref 0–44)
AST: 15 U/L (ref 15–41)
Albumin: 3.4 g/dL — ABNORMAL LOW (ref 3.5–5.0)
Alkaline Phosphatase: 61 U/L (ref 38–126)
Anion gap: 12 (ref 5–15)
BUN: 21 mg/dL (ref 8–23)
CO2: 27 mmol/L (ref 22–32)
Calcium: 9.4 mg/dL (ref 8.9–10.3)
Chloride: 100 mmol/L (ref 98–111)
Creatinine, Ser: 1.17 mg/dL (ref 0.61–1.24)
GFR, Estimated: >60 mL/min (ref >60)
Glucose, Bld: 47 mg/dL — ABNORMAL LOW (ref 70–99)
Potassium: 4.1 mmol/L (ref 3.5–5.1)
Sodium: 139 mmol/L (ref 135–145)
Total Bilirubin: 0.7 mg/dL (ref 0.3–1.2)
Total Protein: 7 g/dL (ref 6.5–8.1)

## 2020-11-04 LAB — CBC WITH DIFFERENTIAL/PLATELET
Abs Immature Granulocytes: 0.02 10*3/uL (ref 0.00–0.07)
Basophils Absolute: 0.1 10*3/uL (ref 0.0–0.1)
Basophils Relative: 1 %
Eosinophils Absolute: 0.2 10*3/uL (ref 0.0–0.5)
Eosinophils Relative: 4 %
HCT: 38.3 % — ABNORMAL LOW (ref 39.0–52.0)
Hemoglobin: 12.1 g/dL — ABNORMAL LOW (ref 13.0–17.0)
Immature Granulocytes: 0 %
Lymphocytes Relative: 3 %
Lymphs Abs: 0.2 10*3/uL — ABNORMAL LOW (ref 0.7–4.0)
MCH: 29.7 pg (ref 26.0–34.0)
MCHC: 31.6 g/dL (ref 30.0–36.0)
MCV: 93.9 fL (ref 80.0–100.0)
Monocytes Absolute: 0.5 10*3/uL (ref 0.1–1.0)
Monocytes Relative: 8 %
Neutro Abs: 5.1 10*3/uL (ref 1.7–7.7)
Neutrophils Relative %: 84 %
Platelets: 272 10*3/uL (ref 150–400)
RBC: 4.08 MIL/uL — ABNORMAL LOW (ref 4.22–5.81)
RDW: 15.9 % — ABNORMAL HIGH (ref 11.5–15.5)
WBC: 6.1 10*3/uL (ref 4.0–10.5)
nRBC: 0 % (ref 0.0–0.2)

## 2020-11-04 LAB — MAGNESIUM: Magnesium: 2.1 mg/dL (ref 1.7–2.4)

## 2020-11-04 MED ORDER — ONDANSETRON HCL 8 MG PO TABS: 8.0000 mg | ORAL_TABLET | Freq: Two times a day (BID) | ORAL | 1 refills | Status: DC | PRN

## 2020-11-04 NOTE — Progress Notes (Signed)
Hematology/Oncology  Follow Up note [facility] [phone] [phone]) [phone]   Patient Care Team: Albina Billet, MD as PCP - General (Internal Medicine) Albina Billet, MD (Internal Medicine) Bary Castilla, Forest Gleason, MD (General Surgery) Noreene Filbert, MD as Referring Physician (Radiation Oncology) Earlie Server, MD as Consulting Physician (Oncology)  CHIEF COMPLAINTS/PURPOSE OF CONSULTATION:  Follow up for head and neck cancer.  HISTORY OF PRESENTING ILLNESS:  Ivan Blankley. is a  64 y.o.  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 P16 positive. cT2 cN2 disease, stage II, # Concurrent ChemoRT  Cisplatin 100 mg/m2 q3weeks with concurrent RT which started on 12/31/2017.  S/p one dose of Cisplatin. Cisplatin discontinued due to nephrotoxicity. Switch to weekly Carboplatin (AUC 2) / Taxol 58m/m2 [ finished May 2019] Patient has finished concurrent chemoradiation in May 2019  # He reports doing well until in July 2020, he started to have hoarseness after singing for 2 to 3 hours. He has changed his insurance and Dr. MTami Ribasis no longer covered by his current insurance.  So he [place]. Flexible laryngoscopy showed Vocal cord paralysis.  Patient was recommended to have a PET scan done for restaging. Patient's primary care provider Dr. THall Busingordered a PET scan for patient which was done on 08/20/2019. 08/20/2019 PET scan showed asymmetric hypermetabolic him involving the left vocal cords with hypermetabolic adenopathy in the neck, and the chest.  He also has hypermetabolic pulmonary nodule along the minor fissure which is felt to be metastatic. Tiny proximal left ureteral stone without hydronephrosis.  Possible gallbladder sludge.  Enlarged prostate.   Atherosclerosis.  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  # 09/08/2019 Omniseq NGS showed PD-L1 CPS 20, TMB indeterminant, MSI stable,PIK3CA E542K, E545K  Started on chemotherapy with 6 cycles of carboplatin, 5-FU and Keytruda, finished in February 2021.  And has been on Keytruda maintenance. Initially he had a good response, however progressed whiled on Keytruda maintenance.  PET 04/26/2020 worsening of disease in the chest.  Interval enlargement of the right thoracic inlet and paratracheal lymph nodes that were previously enlarged with new activity in the right hilum corresponding to a lymph node in this location.  Also increased activity in the right upper lobe nodule. Right level 3 lymph node in the neck and anterior mediastinal nodal activity is diminished.  These areas are quite small on prior study. Diminished symmetric uptake within the left as compared to the right vocal cord. Right mandible increased activity in the area of third molar.  This is consistent with his recent tooth infection.  # [facility] [place].  Patient was offered for clinical trial with pembrolizumab/lenvatinib versus standard therapy. Patient received additional 1 cycle of Keytruda while waiting to be enrolled to the trial.   #Disease progression.  [facility] right upper paratracheal node is concerning for metastatic disease and a new from 01/20/2020 PET/CT.  The node closely abuts and possibly invade the posterolateral right tracheal wall as well as the esophagus.  Right vocal cord paralysis.  Symmetric soft tissue density at the right base of tongue could reflect residual/recurrent primary  malignancy versus posttreatment changes.  Right supraclavicular lymph node is no longer appreciated  with post treatment changes.  There is increased size of right minor fissure nodule which was metabolically avid on previous PET scan.  New mediastinal and hilar lymphadenopathy concerning for new nodal disease.  No evidence of metastatic disease below the diaphragm.  Nonobstructing 3 mm distal ureter stone.  07/07/2020 started on palliative chemotherapy with docetaxel 09/02/2020 3 cycles of docetaxel  with G-CSF support.    [facility] which unfortunately showed progression of disease. Images are not available for me to review.  CT was compared to June 24, 2020 CT done at [facility]. 09/02/2020,chest without contrast with 3D MIPS protocol showed Interval increase in the size of the right upper lobe perifissural nodule 1.6 x 1.5 cm-previously 1.4 x 1.2 cm tracheoesophageal soft tissue mass 3.4 x 5.0 cm with regional mass-effect on the esophagus-previously 2.4 x 3.6 cm, and mediastinal lymphadenopathy-right upper paratracheal lymph node 1.9 cm-previously 1.5 cm, suspicious for progressive metastatic disease. Subcentimeter lung nodules are stable. CT neck with contrast showed slight interval increase in size of upper right paratracheal nodal conglomerate.  Questionable soft tissue invasion of the posterior tracheal appears worsened.  Compression with potential invasion of the esophagus is again noted.  Prior vocal cord paralysis again noted.  Unchanged right tongue base asymmetry. I had a discussion with patient's [facility] oncologist Dr. Barrington Ellison over the phone. Dr.Choe recommends adding carboplatin AUC 5 to docetaxel regimen for now which hopefully to help to reduce the size of the tracheoesophageal soft tissue mass.  Also recommend palliative radiation. 09/07/2020, carboplatin AUC 5+ docetaxel S/p PEG tube placement on 09/23/2020 #09/28/2020, started on palliative radiation to esophageal  #Patient establish care with [facility] Dr. Maxie Better   09/29/2020  guardant 360 which showed PIK3CA E545K, PIK3CA  amplification. MSI stable, APC E2172K VUS , APC L2138L, FGFR2 R573 VUS   .  INTERVAL HISTORY Ivan Garcia. is a 64 y.o. male who presents for follow-up for metastatic squamous cell carcinoma, P 16+.   Patient reports that he has  10/2220 patient finished palliative radiation to esophageal mass. Dr. Baruch Gouty ordered PET scan next week for further evaluation. During the interval, patient has had phone conversation with both [facility] Dr. Maxie Better, as well as [facility] Dr. Barrington Ellison. Patient declined a clinical trial at [facility].  He also spoke with Dr. Barrington Ellison on 11/26/ 2021. Dr. Barrington Ellison recommends patient to start [facility] after PET scan.  Patient reports dysphagia has not significantly improved yet.  Is able to have a small sip of fluid, and small pieces of food. He feels nauseated after doing nutritions supplement through PEG tube.  He takes Zofran with symptom relief.  He requests refill.  Review of Systems  Constitutional: Negative for chills, fever, malaise/fatigue and weight loss.  HENT: Negative for sore throat.        Odynophagia  Eyes: Negative for redness.  Respiratory: Negative for cough, shortness of breath and wheezing.   Cardiovascular: Negative for chest pain, palpitations and leg swelling.  Gastrointestinal: Negative for abdominal pain, blood in stool, nausea and vomiting.       PEG tube placement  Genitourinary: Negative for dysuria.  Musculoskeletal: Negative for myalgias.  Skin: Negative for rash.  Neurological: Negative for dizziness, tingling and tremors.  Endo/Heme/Allergies: Does not bruise/bleed easily.  Psychiatric/Behavioral: Negative for hallucinations.    MEDICAL HISTORY:  Past Medical History:  Diagnosis Date  . Arthritis   . Cancer ([place])  Head and neck cancer  . Hemorrhoids   . Hyperlipidemia   . Hypertension     SURGICAL HISTORY: Past Surgical History:  Procedure Laterality Date  . COLONOSCOPY WITH PROPOFOL N/A 04/04/2017   Procedure: COLONOSCOPY  WITH PROPOFOL;  Surgeon: Robert Bellow, MD;  Location: [facility] ENDOSCOPY;  Service: Endoscopy;  Laterality: N/A;  . IR GASTROSTOMY TUBE MOD SED  09/24/2020  . MANDIBLE SURGERY    . PORT-A-CATH REMOVAL  07/17/2018  . PORTA CATH INSERTION N/A 12/19/2017   Procedure: PORTA CATH INSERTION;  Surgeon: Algernon Huxley, MD;  Location: [place] CV LAB;  Service: Cardiovascular;  Laterality: N/A;  . PORTA CATH INSERTION N/A 07/17/2018   Procedure: PORTA CATH INSERTION;  Surgeon: Algernon Huxley, MD;  Location: [place] CV LAB;  Service: Cardiovascular;  Laterality: N/A;  . PORTA CATH INSERTION N/A 09/18/2019   Procedure: PORTA CATH INSERTION;  Surgeon: Algernon Huxley, MD;  Location: [place] CV LAB;  Service: Cardiovascular;  Laterality: N/A;  . TONSILLECTOMY      SOCIAL HISTORY: Social History   Socioeconomic History  . Marital status: Single    Spouse name: Not on file  . Number of children: Not on file  . Years of education: Not on file  . Highest education level: Not on file  Occupational History  . Not on file  Tobacco Use  . Smoking status: Never Smoker  . Smokeless tobacco: Never Used  Vaping Use  . Vaping Use: Never used  Substance and Sexual Activity  . Alcohol use: No  . Drug use: No  . Sexual activity: Not on file  Other Topics Concern  . Not on file  Social History Narrative  . Not on file   Social Determinants of Health   Financial Resource Strain:   . Difficulty of Paying Living Expenses: Not on file  Food Insecurity:   . Worried About Charity fundraiser in the Last Year: Not on file  . Ran Out of Food in the Last Year: Not on file  Transportation Needs:   . Lack of Transportation (Medical): Not on file  . Lack of Transportation (Non-Medical): Not on file  Physical Activity:   . Days of Exercise per Week: Not on file  . Minutes of Exercise per Session: Not on file  Stress:   . Feeling of Stress : Not on file  Social Connections:   . Frequency of  Communication with Friends and Family: Not on file  . Frequency of Social Gatherings with Friends and Family: Not on file  . Attends Religious Services: Not on file  . Active Member of Clubs or Organizations: Not on file  . Attends Archivist Meetings: Not on file  . Marital Status: Not on file  Intimate Partner Violence:   . Fear of Current or Ex-Partner: Not on file  . Emotionally Abused: Not on file  . Physically Abused: Not on file  . Sexually Abused: Not on file    FAMILY HISTORY: Family History  Problem Relation Age of Onset  . Breast cancer Mother   . Asthma Mother   . Congestive Heart Failure Mother   . Prostate cancer Father   . Brain cancer Father   . Bladder Cancer Father     ALLERGIES:  has No Known Allergies.  MEDICATIONS:  Current Outpatient Medications  Medication Sig Dispense Refill  . amLODipine (NORVASC) 5 MG tablet Take 5 mg by mouth daily.    Marland Kitchen lidocaine (XYLOCAINE) 2 %  solution     . lidocaine-prilocaine (EMLA) cream Apply to affected area once 30 g 3  . nystatin (MYCOSTATIN) 100000 UNIT/ML suspension Take 5 mLs (500,000 Units total) by mouth in the morning, at noon, and at bedtime. SWISH AND SPIT 473 mL 0  . ondansetron (ZOFRAN) 8 MG tablet Take 1 tablet (8 mg total) by mouth 2 (two) times daily as needed for refractory nausea / vomiting. Start on day 3 after carboplatin chemo. 30 tablet 1  . prochlorperazine (COMPAZINE) 10 MG tablet Take 1 tablet (10 mg total) by mouth every 6 (six) hours as needed (Nausea or vomiting). 30 tablet 1  . rosuvastatin (CRESTOR) 10 MG tablet Take 10 mg by mouth every evening.  4  . Sennosides (SENNA) 8.8 MG/5ML LIQD Take 17.6 mg by mouth daily. 236 mL 1  . dexamethasone (DECADRON) 1 MG tablet Take 1 mg by mouth 2 (two) times daily with a meal. (Patient not taking: Reported on 10/13/2020)    . morphine (ROXANOL) 20 MG/ML concentrated solution Take 1 mL (20 mg total) by mouth every 4 (four) hours as needed for moderate  pain or severe pain. (Patient not taking: Reported on 11/04/2020) 30 mL 0  . omeprazole (PRILOSEC) 20 MG capsule Take 1 capsule (20 mg total) by mouth daily. 90 capsule 1   No current facility-administered medications for this visit.   Facility-Administered Medications Ordered in Other Visits  Medication Dose Route Frequency Provider Last Rate Last Admin  . sodium chloride flush (NS) 0.9 % injection 10 mL  10 mL Intravenous PRN Earlie Server, MD      . sodium chloride flush (NS) 0.9 % injection 10 mL  10 mL Intravenous PRN Earlie Server, MD   10 mL at 10/13/20 1034     PHYSICAL EXAMINATION: ECOG PERFORMANCE STATUS: 1 - Symptomatic but completely ambulatory Vitals:   11/04/20 0843  BP: (!) 119/91  Pulse: (!) 103  Resp: 16  Temp: (!) 97.1 F (36.2 C)  SpO2: 100%   Physical Exam Constitutional:      General: He is not in acute distress.    Appearance: He is not diaphoretic.  HENT:     Head: Normocephalic and atraumatic.     Nose: Nose normal.     Mouth/Throat:     Pharynx: No oropharyngeal exudate.  Eyes:     General: No scleral icterus.    Pupils: Pupils are equal, round, and reactive to light.  Cardiovascular:     Rate and Rhythm: Regular rhythm. Tachycardia present.     Heart sounds: No murmur heard.   Pulmonary:     Effort: Pulmonary effort is normal. No respiratory distress.     Breath sounds: No rales.  Chest:     Chest wall: No tenderness.  Abdominal:     General: There is no distension.     Palpations: Abdomen is soft.     Tenderness: There is no abdominal tenderness.  Musculoskeletal:        General: Normal range of motion.     Cervical back: Normal range of motion and neck supple.  Skin:    General: Skin is warm and dry.     Findings: No erythema.  Neurological:     Mental Status: He is alert and oriented to person, place, and time.     Cranial Nerves: No cranial nerve deficit.     Motor: No abnormal muscle tone.     Coordination: Coordination normal.   Psychiatric:  Mood and Affect: Affect normal.      LABORATORY DATA:  I have reviewed the data as listed Lab Results  Component Value Date   WBC 6.1 11/04/2020   HGB 12.1 (L) 11/04/2020   HCT 38.3 (L) 11/04/2020   MCV 93.9 11/04/2020   PLT 272 11/04/2020   Recent Labs    08/17/20 0804 08/17/20 0804 08/23/20 1414 08/23/20 1414 08/31/20 1244 09/07/20 0833 09/28/20 0817 10/13/20 1024 11/04/20 0822  NA 139   < > 137   < > 137   < > 138 136 139  K 4.0   < > 3.6   < > 4.5   < > 3.9 4.3 4.1  CL 105   < > 100   < > 98   < > 102 99 100  CO2 25   < > 26   < > 27   < > _0 GLUCOSE 101*   < > 129*   < > 134*   < > 117* 117* 47*  BUN 23   < > 21   < > 25*   < > _1 CREATININE 1.52*   < > 1.50*   < > 1.64*   < > 1.07 1.14 1.17  CALCIUM 9.2   < > 8.9   < > 9.3   < > 9.0 9.1 9.4  GFRNONAA 47*   < > 48*   < > 43*   < > >60 >60 >60  GFRAA 55*  --  56*  --  50*  --   --   --   --   PROT 7.4   < > 7.0   < > 7.3   < > 6.5 6.7 7.0  ALBUMIN 3.5   < > 3.5   < > 3.5   < > 3.2* 3.3* 3.4*  AST 19   < > 18   < > 20   < > 21 14* 15  ALT 16   < > 16   < > 17   < > _2 ALKPHOS 82   < > 93   < > 103   < > 69 64 61  BILITOT 0.7   < > 0.8   < > 0.6   < > 0.7 0.6 0.7   < > = values in this interval not displayed.  RADIOGRAPHIC STUDIES: I have personally reviewed the radiological images as listed and agreed with the findings in the report. CT Abdomen Wo Contrast  Result Date: 09/16/2020 CLINICAL DATA:  64 year old with squamous cell carcinoma of the oropharynx. Evaluate anatomy for gastrostomy tube placement. EXAM: CT ABDOMEN WITHOUT CONTRAST TECHNIQUE: Multidetector CT imaging of the abdomen was performed following the standard protocol without IV contrast. COMPARISON:  PET-CT 04/26/2020 FINDINGS: Lower chest: Few densities at the lung bases likely represent atelectasis. Hepatobiliary: 1.3 cm hypodensity in the left hepatic lobe likely represents a cyst. Gallbladder is  decompressed. No biliary dilatation. Pancreas: Unremarkable. No pancreatic ductal dilatation or surrounding inflammatory changes. Spleen: Normal in size without focal abnormality. Adrenals/Urinary Tract: Normal appearance of the adrenal glands. Large low-density cyst involving the right kidney upper pole measures up to 5.0 cm. Tiny stone in the right kidney without hydronephrosis. Probable left renal parapelvic cysts. No definite left hydronephrosis. Stomach/Bowel: Stomach is mildly distended with oral contrast. Normal appearance of the stomach. There are no structures located between the stomach in the anterior abdominal wall. Transverse colon is caudal  to the stomach. Small amount of high-density material within the appendix without inflammatory changes. No evidence for bowel obstruction. Vascular/Lymphatic: Atherosclerotic calcifications in the abdominal aorta without aneurysm. No significant lymph node enlargement in the abdomen. Other: Negative for ascites.  Negative for free air. Musculoskeletal: Degenerative facet disease in lower lumbar spine. IMPRESSION: 1. Anatomy is amendable for a percutaneous gastrostomy tube placement. 2. Evidence for bilateral renal cysts. 3.  Aortic Atherosclerosis (ICD10-I70.0). 4. Tiny nonobstructive right kidney stone. Electronically Signed   By: Markus Daft M.D.   On: 09/16/2020 15:07   IR GASTROSTOMY TUBE MOD SED  Result Date: 09/24/2020 INDICATION: History of head neck cancer. In need of percutaneous gastrostomy tube placement for enteric nutrition supplementation purposes. EXAM: PULL TROUGH GASTROSTOMY TUBE PLACEMENT COMPARISON:  CT abdomen and pelvis-09/16/2020; neck CT-09/07/2020 MEDICATIONS: Ancef 2 gm IV; Antibiotics were administered within 1 hour of the procedure. Glucagon 1 mg IV CONTRAST:  20 cc Visipaque 320 administered into the gastric lumen. ANESTHESIA/SEDATION: Moderate (conscious) sedation was employed during this procedure. A total of Versed 4 mg and  Fentanyl 100 mcg was administered intravenously. Moderate Sedation Time: 22 minutes. The patient's level of consciousness and vital signs were monitored continuously by radiology nursing throughout the procedure under my direct supervision. FLUOROSCOPY TIME:  7 minutes, 48 seconds (48.2 mGy) COMPLICATIONS: None immediate. PROCEDURE: Informed written consent was obtained from the patient following explanation of the procedure, risks, benefits and alternatives. A time out was performed prior to the initiation of the procedure. Ultrasound scanning was performed to demarcate the edge of the left lobe of the liver. Maximal barrier sterile technique utilized including caps, mask, sterile gowns, sterile gloves, large sterile drape, hand hygiene and Betadine prep. The left upper quadrant was sterilely prepped and draped. An oral gastric catheter was inserted into the stomach under fluoroscopy. The existing nasogastric feeding tube was removed. The left costal margin and air opacified transverse colon were identified and avoided. Air was injected into the stomach for insufflation and visualization under fluoroscopy. Under sterile conditions a 17 gauge trocar needle was utilized to access the stomach percutaneously beneath the left subcostal margin after the overlying soft tissues were anesthetized with 1% Lidocaine with epinephrine. Needle position was confirmed within the stomach with aspiration of air and injection of small amount of contrast. A single T tack was deployed for gastropexy. Over an Amplatz guide wire, a 9-French sheath was inserted into the stomach. A snare device was utilized to capture the oral gastric catheter. The snare device was pulled retrograde from the stomach up the esophagus and out the oropharynx. The 20-French pull-through gastrostomy was connected to the snare device and pulled antegrade through the oropharynx down the esophagus into the stomach and then through the percutaneous tract external  to the patient. The gastrostomy was assembled externally. Contrast injection confirms position in the stomach. Several spot radiographic images were obtained in various obliquities for documentation. The patient tolerated procedure well without immediate post procedural complication. FINDINGS: After successful fluoroscopic guided placement, the gastrostomy tube is appropriately positioned with internal disc against the ventral aspect of the gastric lumen. IMPRESSION: Successful fluoroscopic insertion of a 20-French pull-through gastrostomy tube. The gastrostomy may be used immediately for medication administration and in 24 hrs for the initiation of feeds. Electronically Signed   By: Sandi Mariscal M.D.   On: 09/24/2020 11:21     ASSESSMENT & PLAN:   1. 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[place])   2. Dysphagia, unspecified type   3. Stage  3a chronic kidney disease ([place])   4. Tachycardia   5. Port-A-Cath in place   6. 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 Patient finished palliative radiation to esophageal mass. I discussed with Dr. Barrington Ellison who prefers contrast CT scan over PET scan as a baseline prior to proceeding with compassionate use of Piqray.  I discussed with radiation oncology Dr. Baruch Gouty who prefers PET scan from Radonc aspect. Patient will proceed with PET scan on 11/09/2020.  Further management plan pending PET scan results.  I asked the patient to hold off starting [place] until further notification.  # Dysphagia, s/p PEG tube placement.  Continue tube feeding.  Nausea, continue Zofran as instructed.  Refill sent to pharmacy.  #Tachycardia, encourage free water flush through her PEG tube..  # CKD, creatinine is stable # Neoplasm associated pain, Symptoms are stable. #Port-A-Cath in place, continue port flush every 8 weeks.  Follow-up: To be  determined  Earlie Server, MD, PhD Hematology Oncology [place] at [facility] 11/04/2020

## 2020-11-04 NOTE — Progress Notes (Signed)
Patient here for follow up. Pt reports no new concerns. Tube are going well.

## 2020-11-04 NOTE — Progress Notes (Signed)
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  S/p PEG placement on 10/22.   Met with patient in clinic.  Patient reports that he is due a PET scan soon to see if he needs more radiation.  Giving mostly 5 cartons of [employer] 1.4 daily via feeding tube.  Flushing with at least 60 ml of water before and 121m after each feeding.  Eating some orally.  Reports thin liquids strangle him more so than soft foods.  Some days if he eats an evening meal will not give 5 th carton of tube feeding. Some nausea but zofran helps.      Medications: reviewed  Labs: reviewed  Anthropometrics:   Weight 178 lb 12.8 oz today stable 179 lb on 11/10   Estimated Energy Needs  Kcals: 2100-2500 Protein: 105-125 g Fluid: > 2.1 L  NUTRITION DIAGNOSIS: Inadequate oral intake but relying on feeding tube   INTERVENTION:  Patient to continue [employer]1.4, 1 carton 5 times per day. 4 cartons on days when eats 1 meal.  Flush with 626mof water before and 12077mfter.  Patient needs to drink 16 oz (500m55mdditional water or put via feeding tube for better hydration.   Provides 2275 calories, 100 g protein and 2570ml31me water.  If patient does not give 5th feeding will still make sure gets water in for hydration.     MONITORING, EVALUATION, GOAL: weight trends, intake, tube feeding tolerance   NEXT VISIT: Dec 30, phone f/u  Bulmaro Feagans B. AllenZenia Resides LJustice RCudjoe Keystered Dietitian [phone])

## 2020-11-05 ENCOUNTER — Ambulatory Visit: Payer: Medicare Other

## 2020-11-08 ENCOUNTER — Ambulatory Visit: Payer: Medicare Other

## 2020-11-09 ENCOUNTER — Ambulatory Visit: Payer: Medicare Other

## 2020-11-09 ENCOUNTER — Other Ambulatory Visit: Payer: Self-pay

## 2020-11-09 ENCOUNTER — Encounter
Admission: RE | Admit: 2020-11-09 | Discharge: 2020-11-09 | Disposition: A | Discharge status: Home / Self Care | Payer: Medicare Other | Source: Ambulatory Visit | Attending: Radiation Oncology | Admitting: Radiation Oncology

## 2020-11-09 ENCOUNTER — Telehealth: Payer: Self-pay | Admitting: Pharmacy Technician

## 2020-11-09 DIAGNOSIS — I2510 Atherosclerotic heart disease of native coronary artery without angina pectoris: Secondary | ICD-10-CM | POA: Diagnosis not present

## 2020-11-09 DIAGNOSIS — K5730 Diverticulosis of large intestine without perforation or abscess without bleeding: Secondary | ICD-10-CM | POA: Insufficient documentation

## 2020-11-09 DIAGNOSIS — R911 Solitary pulmonary nodule: Secondary | ICD-10-CM | POA: Diagnosis not present

## 2020-11-09 DIAGNOSIS — J439 Emphysema, unspecified: Secondary | ICD-10-CM | POA: Insufficient documentation

## 2020-11-09 DIAGNOSIS — C7989 Secondary malignant neoplasm of other specified sites: Secondary | ICD-10-CM | POA: Diagnosis not present

## 2020-11-09 DIAGNOSIS — I700 Atherosclerosis of aorta: Secondary | ICD-10-CM | POA: Insufficient documentation

## 2020-11-09 DIAGNOSIS — C109 Malignant neoplasm of oropharynx, unspecified: Secondary | ICD-10-CM | POA: Insufficient documentation

## 2020-11-09 LAB — GLUCOSE, CAPILLARY: Glucose-Capillary: 78 mg/dL (ref 70–99)

## 2020-11-09 IMAGING — CT NM PET TUM IMG RESTAG (PS) SKULL BASE T - THIGH
1 of 9 series · 1 of 25 positions shown · non-contrast
Comparison: [DATE] PET-CT. Outside CT neck and chest from
[DATE].

CLINICAL DATA: Subsequent treatment strategy for oropharyngeal
squamous cell carcinoma, most recent radiation therapy 2 weeks
prior.

EXAM:
NUCLEAR MEDICINE PET SKULL BASE TO THIGH
TECHNIQUE: 9.4 mCi F-18 FDG was injected intravenously. Full-ring PET imaging
was performed from the skull base to thigh after the radiotracer. CT
data was obtained and used for attenuation correction and anatomic
localization.
Fasting blood glucose: 78 mg/dl

[Series 3: ct wb 5.0 b30f · axial · 5.0mm · 0.98mm/px · 1 of 329 slices shown]
[im 329/329  brain]
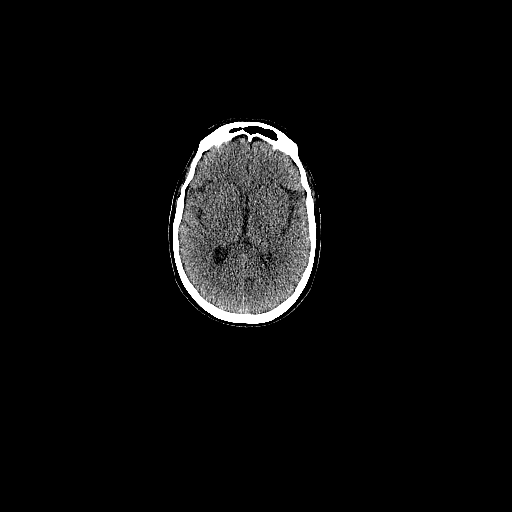

[1 of 25 positions shown; findings below may reference images not displayed]

FINDINGS: Mediastinal blood pool activity: SUV max

Liver activity: SUV max NA

NECK: Right thoracic inlet hypermetabolic 3.9 x 2.6 cm soft tissue
mass located posterior to the right thyroid lobe and to the right of
esophagus with max SUV 12.0 (series 3/image 67), decreased from
x 3.5 cm on [DATE] outside neck CT, increased in size from 2.1 x
1.8 cm and similar in uptake with max SUV 12.9 on [DATE] PET-CT.
No additional hypermetabolic neck lymph nodes.

Incidental CT findings: Right internal jugular Port-A-Cath
terminates at the cavoatrial junction.

CHEST:

Right paratracheal hypermetabolic 1.2 cm lymph node with max SUV
(series 3/image 81), decreased in size from 1.7 cm on [DATE]
outside chest CT, increased in size from 1.0 cm and decreased in
uptake with max SUV 6.2 on [DATE] PET-CT.

Hypermetabolic right hilar lymph node with max SUV 5.2, previous max
SUV 8.3 on [DATE] PET-CT, decreased. No new enlarged or
hypermetabolic axillary, mediastinal or hilar lymph nodes.

Mildly hypermetabolic solid 1.2 cm right upper lobe pulmonary nodule
with max SUV 3.0 (series 3/image 112), previously 1.2 cm with max
SUV 4.2, stable in size and mildly decreased in metabolism. Low
level nonfocal hypermetabolism associated with new patchy
ground-glass opacity in the peripheral right upper lobe (series
3/image 102), presumably inflammatory. No additional hypermetabolic
pulmonary findings.

Incidental CT findings: Coronary atherosclerosis. Atherosclerotic
nonaneurysmal thoracic aorta. Mild centrilobular emphysema.

ABDOMEN/PELVIS: No abnormal hypermetabolic activity within the
liver, pancreas, adrenal glands, or spleen. No hypermetabolic lymph
nodes in the abdomen or pelvis.

Incidental CT findings: Simple 1.8 cm left liver cyst.
Atherosclerotic nonaneurysmal abdominal aorta. Simple 5.1 cm medial
upper right renal cyst. Well-positioned percutaneous gastrostomy
tube terminating in the body of the stomach. Mild diffuse colonic
diverticulosis.

SKELETON: No focal hypermetabolic activity to suggest skeletal
metastasis.

Incidental CT findings: none
IMPRESSION: 1. Hypermetabolic soft tissue metastasis at the right thoracic
inlet, decreased in size since outside [DATE] neck CT, increased
in size and similar in uptake compared to [DATE] PET-CT.
2. Hypermetabolic right paratracheal and right hilar nodal
metastases, decreased in size from outside [DATE] chest CT,
decreased in FDG uptake since [DATE] PET-CT.
3. Mildly hypermetabolic right upper lobe solid pulmonary nodule,
stable in size and mildly decreased in metabolism since [DATE]
PET-CT.
4. No new or progressive hypermetabolic metastatic disease.
5. Chronic findings include: Aortic Atherosclerosis ([QE]-[QE])
and Emphysema ([QE]-[QE]). Coronary atherosclerosis. Mild diffuse
colonic diverticulosis.

## 2020-11-09 MED ORDER — FLUDEOXYGLUCOSE F - 18 (FDG) INJECTION
9.4170 | Freq: Once | INTRAVENOUS | Status: AC | PRN
  Administered 2020-11-09: 9.417 via INTRAVENOUS

## 2020-11-09 NOTE — Telephone Encounter (Signed)
Oral Oncology Patient Advocate Encounter  Patient stopped by office on 11/09/20 to sign a renewal application for [employer] in an effort to reduce patient's out of pocket expense for Piqray to $0.    Once Dr Collie Siad portion is signed I will fax to [employer].    [place] Patient [place] Phone [phone] Fax [phone] 11/11/2020 1:52 PM

## 2020-11-10 ENCOUNTER — Ambulatory Visit: Payer: Medicare Other

## 2020-11-11 NOTE — Telephone Encounter (Signed)
Oral Oncology Patient Advocate Encounter  MD portion completed today.  The renewal application has been completed and faxed in an effort to keep the patient's out of pocket expense for Piqray at $0.     Application completed and faxed to [phone].    [employer] patient assistance phone number for follow up is [phone].    This encounter will be updated until final determination.  [place] Patient [place] Phone [phone] Fax [phone] 11/11/2020 3:58 PM

## 2020-11-15 ENCOUNTER — Ambulatory Visit
Admission: RE | Admit: 2020-11-15 | Discharge: 2020-11-15 | Disposition: A | Discharge status: Home / Self Care | Payer: Medicare Other | Source: Ambulatory Visit | Attending: Radiation Oncology | Admitting: Radiation Oncology

## 2020-11-15 ENCOUNTER — Other Ambulatory Visit: Payer: Self-pay

## 2020-11-15 ENCOUNTER — Other Ambulatory Visit: Payer: Self-pay | Admitting: *Deleted

## 2020-11-15 ENCOUNTER — Encounter: Payer: Self-pay | Admitting: Radiation Oncology

## 2020-11-15 VITALS — BP 117/87 | HR 107 | Temp 97.0°F | Resp 16 | Wt 179.5 lb

## 2020-11-15 DIAGNOSIS — R1310 Dysphagia, unspecified: Secondary | ICD-10-CM | POA: Diagnosis not present

## 2020-11-15 DIAGNOSIS — C01 Malignant neoplasm of base of tongue: Secondary | ICD-10-CM | POA: Insufficient documentation

## 2020-11-15 DIAGNOSIS — C778 Secondary and unspecified malignant neoplasm of lymph nodes of multiple regions: Secondary | ICD-10-CM | POA: Diagnosis not present

## 2020-11-15 DIAGNOSIS — C109 Malignant neoplasm of oropharynx, unspecified: Secondary | ICD-10-CM

## 2020-11-15 MED ORDER — SUCRALFATE 1 G PO TABS: 1.0000 g | ORAL_TABLET | Freq: Three times a day (TID) | ORAL | 3 refills | Status: DC

## 2020-11-15 NOTE — Progress Notes (Signed)
Radiation Oncology Follow up Note  Name: Ivan Garcia [facility].   Date:   11/15/2020 MRN:  [id number] DOB: 01/31/1956    This 64 y.o. 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  His field was too large to encompass everything too large dose which I desired we decided to treat to [place] and reevaluate with a PET scan.  He is otherwise doing well he does have some slight dysphagia with him starting him on Carafate rinses for that.Marland Kitchen  PET CT scan showed hypermetabolic soft tissue in the right thoracic inlet decreased in size although still prominent.  Remainder of right paratracheal right hilar nodal metastasis all have decreased.  COMPLICATIONS OF TREATMENT: none  FOLLOW UP COMPLIANCE: keeps appointments   PHYSICAL EXAM:  BP 117/87 (BP Location: Left Arm, Patient Position: Sitting)   Pulse (!) 107   Temp (!) 97 F (36.1 C) (Tympanic)   Resp 16   Wt 179 lb 8 oz (81.4 kg)   BMI 25.76 kg/m  Well-developed well-nourished patient in NAD. HEENT reveals PERLA, EOMI, discs not visualized.  Oral cavity is clear. No oral mucosal lesions are identified. Neck is clear without evidence of cervical or supraclavicular adenopathy. Lungs are clear to A&P. Cardiac examination is essentially unremarkable with regular rate and rhythm without murmur rub or thrill. Abdomen is benign with no organomegaly or masses noted. Motor sensory and DTR levels are equal and symmetric in the upper and lower extremities. Cranial nerves II through XII are grossly intact. Proprioception is intact. No peripheral adenopathy or edema is identified.  No motor or sensory levels are noted. Crude visual fields are within normal range.  RADIOLOGY RESULTS: PET CT scan reviewed compatible with above-stated findings  PLAN: At this time elect to go ahead and boost the area of hypermetabolic activity in the right supraclavicular region to 30 Gray in 15 fractions.  Would use 3-dimensional treatment planning with a hybrid technique and try to spare critical structures such as a spinal cord esophagus and trachea.  Risks and benefits of treatment including possible worsening of his dysphagia fatigue skin reaction alteration of blood counts all were described in detail I have personally set up and ordered CT simulation for later this week.  Patient comprehends my recommendations well.  I would like to take this opportunity to thank you for allowing me to participate in the care of your patient.Noreene Filbert, MD

## 2020-11-16 ENCOUNTER — Ambulatory Visit
Admission: RE | Admit: 2020-11-16 | Discharge: 2020-11-16 | Disposition: A | Discharge status: Home / Self Care | Payer: Medicare Other | Source: Ambulatory Visit | Attending: Radiation Oncology | Admitting: Radiation Oncology

## 2020-11-16 DIAGNOSIS — Z510 Encounter for antineoplastic radiation therapy: Secondary | ICD-10-CM | POA: Insufficient documentation

## 2020-11-16 DIAGNOSIS — C01 Malignant neoplasm of base of tongue: Secondary | ICD-10-CM | POA: Insufficient documentation

## 2020-11-18 DIAGNOSIS — Z510 Encounter for antineoplastic radiation therapy: Secondary | ICD-10-CM | POA: Diagnosis not present

## 2020-11-18 DIAGNOSIS — C01 Malignant neoplasm of base of tongue: Secondary | ICD-10-CM | POA: Diagnosis present

## 2020-11-19 ENCOUNTER — Other Ambulatory Visit: Payer: Self-pay | Admitting: *Deleted

## 2020-11-19 DIAGNOSIS — C109 Malignant neoplasm of oropharynx, unspecified: Secondary | ICD-10-CM

## 2020-11-23 ENCOUNTER — Ambulatory Visit: Admission: RE | Admit: 2020-11-23 | Payer: Medicare Other | Source: Ambulatory Visit

## 2020-11-24 ENCOUNTER — Ambulatory Visit
Admission: RE | Admit: 2020-11-24 | Discharge: 2020-11-24 | Disposition: A | Discharge status: Home / Self Care | Payer: Medicare Other | Source: Ambulatory Visit | Attending: Radiation Oncology | Admitting: Radiation Oncology

## 2020-11-24 DIAGNOSIS — Z510 Encounter for antineoplastic radiation therapy: Secondary | ICD-10-CM | POA: Diagnosis not present

## 2020-11-25 ENCOUNTER — Ambulatory Visit
Admission: RE | Admit: 2020-11-25 | Discharge: 2020-11-25 | Disposition: A | Discharge status: Home / Self Care | Payer: Medicare Other | Source: Ambulatory Visit | Attending: Radiation Oncology | Admitting: Radiation Oncology

## 2020-11-25 DIAGNOSIS — Z510 Encounter for antineoplastic radiation therapy: Secondary | ICD-10-CM | POA: Diagnosis not present

## 2020-11-29 ENCOUNTER — Ambulatory Visit
Admission: RE | Admit: 2020-11-29 | Discharge: 2020-11-29 | Disposition: A | Discharge status: Home / Self Care | Payer: Medicare Other | Source: Ambulatory Visit | Attending: Radiation Oncology | Admitting: Radiation Oncology

## 2020-11-29 DIAGNOSIS — Z510 Encounter for antineoplastic radiation therapy: Secondary | ICD-10-CM | POA: Diagnosis not present

## 2020-11-30 ENCOUNTER — Ambulatory Visit
Admission: RE | Admit: 2020-11-30 | Discharge: 2020-11-30 | Disposition: A | Discharge status: Home / Self Care | Payer: Medicare Other | Source: Ambulatory Visit | Attending: Radiation Oncology | Admitting: Radiation Oncology

## 2020-11-30 DIAGNOSIS — Z510 Encounter for antineoplastic radiation therapy: Secondary | ICD-10-CM | POA: Diagnosis not present

## 2020-12-01 ENCOUNTER — Ambulatory Visit
Admission: RE | Admit: 2020-12-01 | Discharge: 2020-12-01 | Disposition: A | Discharge status: Home / Self Care | Payer: Medicare Other | Source: Ambulatory Visit | Attending: Radiation Oncology | Admitting: Radiation Oncology

## 2020-12-01 DIAGNOSIS — Z510 Encounter for antineoplastic radiation therapy: Secondary | ICD-10-CM | POA: Diagnosis not present

## 2020-12-02 ENCOUNTER — Inpatient Hospital Stay (HOSPITAL_BASED_OUTPATIENT_CLINIC_OR_DEPARTMENT_OTHER): Payer: Medicare Other | Admitting: Nurse Practitioner

## 2020-12-02 ENCOUNTER — Ambulatory Visit
Admission: RE | Admit: 2020-12-02 | Discharge: 2020-12-02 | Disposition: A | Discharge status: Home / Self Care | Payer: Medicare Other | Source: Ambulatory Visit | Attending: Radiation Oncology | Admitting: Radiation Oncology

## 2020-12-02 ENCOUNTER — Inpatient Hospital Stay: Payer: Medicare Other

## 2020-12-02 ENCOUNTER — Other Ambulatory Visit: Payer: Self-pay

## 2020-12-02 VITALS — BP 126/92 | Temp 97.6°F | Wt 180.0 lb

## 2020-12-02 DIAGNOSIS — K9423 Gastrostomy malfunction: Secondary | ICD-10-CM

## 2020-12-02 DIAGNOSIS — C109 Malignant neoplasm of oropharynx, unspecified: Secondary | ICD-10-CM | POA: Diagnosis not present

## 2020-12-02 DIAGNOSIS — Z510 Encounter for antineoplastic radiation therapy: Secondary | ICD-10-CM | POA: Diagnosis not present

## 2020-12-02 NOTE — Progress Notes (Signed)
Symptom Management [place]  [phone] [phone]  Patient Care Team: Albina Billet, MD as PCP - General (Internal Medicine) Albina Billet, MD (Internal Medicine) Bary Castilla, Forest Gleason, MD (General Surgery) Noreene Filbert, MD as Referring Physician (Radiation Oncology) Earlie Server, MD as Consulting Physician (Oncology)   Name of the patient: Ivan Garcia  [id number]  10-Jan-1956   Date of visit: 12/02/20  Diagnosis-squamous cell carcinoma of the oropharynx  Chief complaint/ Reason for visit-possible PEG site infection  Heme/Onc history:  Oncology History  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[place])  12/14/2017 Initial Diagnosis   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[place])   09/17/2019 - 07/27/2020 Chemotherapy   The patient had [No matching medication found in this treatment plan]  for chemotherapy treatment.    09/29/2019 - 04/06/2020 Chemotherapy   The patient had palonosetron (ALOXI) injection 0.25 mg, 0.25 mg, Intravenous,  Once, 6 of 6 cycles Administration: 0.25 mg (09/29/2019), 0.25 mg (12/22/2019), 0.25 mg (10/20/2019), 0.25 mg (12/01/2019), 0.25 mg (11/10/2019), 0.25 mg (01/12/2020) pegfilgrastim (NEULASTA ONPRO KIT) injection 6 mg, 6 mg, Subcutaneous, Once, 2 of 2 cycles Administration: 6 mg (12/26/2019), 6 mg (01/16/2020) pembrolizumab (KEYTRUDA) 200 mg in sodium chloride 0.9 % 50 mL chemo infusion, 200 mg (100 % of original dose 200 mg), Intravenous, Once, 10 of 10 cycles Dose modification: 200 mg (original dose 200 mg, Cycle 1, Reason: Provider Judgment) Administration: 200 mg (09/29/2019), 200 mg (10/20/2019), 200 mg (12/01/2019), 200 mg (12/22/2019), 200 mg (11/10/2019), 200 mg (01/12/2020), 200 mg (02/02/2020), 200 mg (02/23/2020), 200 mg (03/15/2020), 200 mg (04/06/2020) fosaprepitant (EMEND) 150 mg, dexamethasone (DECADRON) 12 mg in sodium chloride 0.9 % 145 mL IVPB, , Intravenous,  Once, 6 of 6 cycles Administration:   (09/29/2019),  (12/22/2019),  (10/20/2019),  (12/01/2019),  (11/10/2019),  (01/12/2020) fluorouracil (ADRUCIL) 7,900 mg in sodium chloride 0.9 % 92 mL chemo infusion, 1,000 mg/m2/day = 7,900 mg, Intravenous, 4D (96 hours ), 6 of 6 cycles Dose modification: 900 mg/m2/day (original dose 1,000 mg/m2/day, Cycle 5, Reason: Dose not tolerated), 850 mg/m2/day (original dose 1,000 mg/m2/day, Cycle 5, Reason: Dose not tolerated) Administration: 7,900 mg (09/29/2019), 6,750 mg (12/22/2019), 7,900 mg (10/20/2019), 6,750 mg (12/01/2019), 7,150 mg (11/10/2019), 6,750 mg (01/12/2020) CARBOplatin (PARAPLATIN) 500 mg in sodium chloride 0.9 % 250 mL chemo infusion, 500 mg (100 % of original dose 499 mg), Intravenous,  Once, 6 of 6 cycles Dose modification:   (original dose 499 mg, Cycle 1),   (original dose 440 mg, Cycle 5) Administration: 500 mg (09/29/2019), 440 mg (10/20/2019), 390 mg (12/01/2019), 440 mg (11/10/2019), 430 mg (01/12/2020)  for chemotherapy treatment.    04/28/2020 - 04/28/2020 Chemotherapy   The patient had cetuximab (ERBITUX) chemo infusion 800 mg, 400 mg/m2, Intravenous, Once, 0 of 8 cycles  for chemotherapy treatment.    05/14/2020 - 05/14/2020 Chemotherapy   The patient had pembrolizumab (KEYTRUDA) 200 mg in sodium chloride 0.9 % 50 mL chemo infusion, 200 mg, Intravenous, Once, 1 of 4 cycles Administration: 200 mg (05/14/2020)  for chemotherapy treatment.    07/06/2020 -  Chemotherapy   The patient had palonosetron (ALOXI) injection 0.25 mg, 0.25 mg, Intravenous,  Once, 1 of 1 cycle Administration: 0.25 mg (09/07/2020) pegfilgrastim-jmdb (FULPHILA) injection 6 mg, 6 mg, Subcutaneous,  Once, 4 of 4 cycles Administration: 6 mg (07/08/2020), 6 mg (07/29/2020), 6 mg (08/19/2020), 6 mg (09/09/2020) CARBOplatin (PARAPLATIN) 430 mg in sodium chloride 0.9 % 250 mL chemo infusion, 460 mg (100 % of original dose  464.5 mg), Intravenous,  Once, 1 of 1 cycle Dose modification:   (original dose 464.5 mg, Cycle  4) Administration: 430 mg (09/07/2020) DOCEtaxel (TAXOTERE) 150 mg in sodium chloride 0.9 % 250 mL chemo infusion, 75 mg/m2 = 150 mg, Intravenous,  Once, 4 of 4 cycles Administration: 150 mg (07/06/2020), 150 mg (07/27/2020), 150 mg (08/17/2020), 150 mg (09/07/2020) fosaprepitant (EMEND) 150 mg in sodium chloride 0.9 % 145 mL IVPB, 150 mg, Intravenous,  Once, 1 of 1 cycle Administration: 150 mg (09/07/2020)  for chemotherapy treatment.      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  He noticed some drainage around the port site.  No redness or pain.  He cleaned the area with hydrogen peroxide and alcohol (since discontinued).  Otherwise he feels well and denies complaints.  Review of systems- Review of Systems  Constitutional: Negative for chills, fever, malaise/fatigue and weight loss.  HENT: Negative for hearing loss, nosebleeds, sore throat and tinnitus.   Eyes: Negative for blurred vision and double vision.  Respiratory: Negative for cough, hemoptysis, shortness of breath and wheezing.   Cardiovascular: Negative for chest pain, palpitations and leg swelling.  Gastrointestinal: Negative for abdominal pain, blood in stool, constipation, diarrhea, melena, nausea and vomiting.       Per HPI  Genitourinary: Negative for dysuria and urgency.  Musculoskeletal: Negative for back pain, falls, joint pain and myalgias.  Skin: Negative for itching and rash.  Neurological: Negative for dizziness, tingling, sensory change, loss of consciousness, weakness and headaches.  Endo/Heme/Allergies: Negative for environmental allergies. Does not bruise/bleed easily.  Psychiatric/Behavioral: Negative for depression. The patient is not nervous/anxious and does not have insomnia.       No Known Allergies  Past Medical History:  Diagnosis Date  . Arthritis   . Cancer ([place])    Head and neck cancer  . Hemorrhoids   . Hyperlipidemia   . Hypertension     Past Surgical History:  Procedure Laterality Date  . COLONOSCOPY WITH PROPOFOL N/A 04/04/2017   Procedure: COLONOSCOPY WITH PROPOFOL;  Surgeon: Robert Bellow, MD;  Location: [facility] ENDOSCOPY;  Service: Endoscopy;  Laterality: N/A;  . IR GASTROSTOMY TUBE MOD SED  09/24/2020  . MANDIBLE SURGERY    . PORT-A-CATH REMOVAL  07/17/2018  . PORTA CATH INSERTION N/A 12/19/2017   Procedure: PORTA CATH INSERTION;  Surgeon: Algernon Huxley, MD;  Location: [facility] CV LAB;  Service: Cardiovascular;  Laterality: N/A;  . PORTA CATH INSERTION N/A 07/17/2018   Procedure: PORTA CATH INSERTION;  Surgeon: Algernon Huxley, MD;  Location: [place] [facility] LAB;  Service: Cardiovascular;  Laterality: N/A;  . PORTA CATH INSERTION N/A 09/18/2019   Procedure: PORTA CATH INSERTION;  Surgeon: Algernon Huxley, MD;  Location: [place] CV LAB;  Service: Cardiovascular;  Laterality: N/A;  . TONSILLECTOMY      Social History   Socioeconomic History  . Marital status: Single    Spouse name: Not on file  . Number of children: Not on file  . Years of education: Not on file  . Highest education level: Not on file  Occupational History  . Not on file  Tobacco Use  . Smoking status: Never Smoker  . Smokeless tobacco: Never Used  Vaping Use  . Vaping Use: Never used  Substance and Sexual Activity  . Alcohol use: No  . Drug use: No  . Sexual activity: Not on file  Other Topics Concern  . Not on file  Social History Narrative  . Not on file   Social Determinants of Health   Financial Resource Strain: Not on file  Food Insecurity: Not on file  Transportation Needs: Not on file  Physical Activity: Not on file  Stress: Not on file  Social Connections: Not on file  Intimate Partner Violence: Not on file    Family History  Problem  Relation Age of Onset  . Breast cancer Mother   . Asthma Mother   . Congestive Heart Failure Mother   . Prostate cancer Father   . Brain cancer Father   . Bladder Cancer Father      Current Outpatient Medications:  .  amLODipine (NORVASC) 5 MG tablet, Take 5 mg by mouth daily., Disp: , Rfl:  .  dexamethasone (DECADRON) 1 MG tablet, Take 1 mg by mouth 2 (two) times daily with a meal. (Patient not taking: No sig reported), Disp: , Rfl:  .  lidocaine (XYLOCAINE) 2 % solution, , Disp: , Rfl:  .  lidocaine-prilocaine (EMLA) cream, Apply to affected area once, Disp: 30 g, Rfl: 3 .  morphine (ROXANOL) 20 MG/ML concentrated solution, Take 1 mL (20 mg total) by mouth every 4 (four) hours as needed for moderate pain or severe pain. (Patient not taking: No sig reported), Disp: 30 mL, Rfl: 0 .  nystatin (MYCOSTATIN) 100000 UNIT/ML suspension, Take 5 mLs (500,000 Units total) by mouth in the morning, at noon, and at bedtime. SWISH AND SPIT, Disp: 473 mL, Rfl: 0 .  omeprazole (PRILOSEC) 20 MG capsule, Take 1 capsule (20 mg total) by mouth daily., Disp: 90 capsule, Rfl: 1 .  ondansetron (ZOFRAN) 8 MG tablet, Take 1 tablet (8 mg total) by mouth 2 (two) times daily as needed for refractory nausea / vomiting. Start on day 3 after carboplatin chemo., Disp: 30 tablet, Rfl: 1 .  prochlorperazine (COMPAZINE) 10 MG tablet, Take 1 tablet (10 mg total) by mouth every 6 (six) hours as needed (Nausea or vomiting)., Disp: 30 tablet, Rfl: 1 .  rosuvastatin (CRESTOR) 10 MG tablet, Take 10 mg by mouth every evening., Disp: , Rfl: 4 .  Sennosides (SENNA) 8.8 MG/5ML LIQD, Take 17.6 mg by mouth daily., Disp: 236 mL, Rfl: 1 .  sucralfate (CARAFATE) 1 g tablet, Take 1 tablet (1 g total) by mouth 3 (three) times daily. Dissolve in 3-4 tbsp warm water, swish and swallow., Disp: 90 tablet, Rfl: 3 No current facility-administered medications for this visit.  Facility-Administered Medications Ordered in Other Visits:  .  sodium  chloride flush (NS) 0.9 % injection 10 mL, 10 mL, Intravenous, PRN, Earlie Server, MD .  sodium chloride flush (NS) 0.9 % injection 10 mL, 10 mL, Intravenous, PRN, Earlie Server, MD, 10 mL at 10/13/20 1034  Physical exam:  Vitals:   12/02/20 1058  BP: (!) 126/92  Temp: 97.6 F (36.4 C)  TempSrc: Tympanic  Weight: 180 lb (81.6 kg)   Physical Exam Constitutional:      Appearance: He is not ill-appearing.  Eyes:     General: No scleral icterus.    Conjunctiva/sclera: Conjunctivae normal.  Pulmonary:     Effort: Pulmonary effort is normal. No respiratory distress.  Abdominal:     General: There is no distension.     Tenderness: There is no abdominal tenderness.  Comments: Slight maceration of skin at PEG opening.  Yellow thin drainage moderate.  Some crusted brown though slight.  Not malodorous, or red.  Skin:    General: Skin is warm and dry.  Neurological:     Mental Status: He is alert and oriented to person, place, and time.  Psychiatric:        Mood and Affect: Mood normal.        Behavior: Behavior normal.      CMP Latest Ref Rng & Units 11/04/2020  Glucose 70 - 99 mg/dL 47(L)  BUN 8 - 23 mg/dL 21  Creatinine 0.61 - 1.24 mg/dL 1.17  Sodium 135 - 145 mmol/L 139  Potassium 3.5 - 5.1 mmol/L 4.1  Chloride 98 - 111 mmol/L 100  CO2 22 - 32 mmol/L 27  Calcium 8.9 - 10.3 mg/dL 9.4  Total Protein 6.5 - 8.1 g/dL 7.0  Total Bilirubin 0.3 - 1.2 mg/dL 0.7  Alkaline Phos 38 - 126 U/L 61  AST 15 - 41 U/L 15  ALT 0 - 44 U/L 18   CBC Latest Ref Rng & Units 11/04/2020  WBC 4.0 - 10.5 K/uL 6.1  Hemoglobin 13.0 - 17.0 g/dL 12.1(L)  Hematocrit 39.0 - 52.0 % 38.3(L)  Platelets 150 - 400 K/uL 272    No images are attached to the encounter.  NM PET Image Restag (PS) Skull Base To Thigh  Result Date: 11/10/2020 CLINICAL DATA:  Subsequent treatment strategy for oropharyngeal squamous cell carcinoma, most recent radiation therapy 2 weeks prior. EXAM: NUCLEAR MEDICINE PET SKULL BASE TO THIGH  TECHNIQUE: 9.4 mCi F-18 FDG was injected intravenously. Full-ring PET imaging was performed from the skull base to thigh after the radiotracer. CT data was obtained and used for attenuation correction and anatomic localization. Fasting blood glucose: 78 mg/dl COMPARISON:  04/26/2020 PET-CT. Outside CT neck and chest from 09/02/2020. FINDINGS: Mediastinal blood pool activity: SUV max 2.3 Liver activity: SUV max NA NECK: Right thoracic inlet hypermetabolic 3.9 x 2.6 cm soft tissue mass located posterior to the right thyroid lobe and to the right of esophagus with max SUV 12.0 (series 3/image 67), decreased from 5.2 x 3.5 cm on 09/02/2020 outside neck CT, increased in size from 2.1 x 1.8 cm and similar in uptake with max SUV 12.9 on 04/26/2020 PET-CT. No additional hypermetabolic neck lymph nodes. Incidental CT findings: Right internal jugular Port-A-Cath terminates at the cavoatrial junction. CHEST: Right paratracheal hypermetabolic 1.2 cm lymph node with max SUV 4.0 (series 3/image 81), decreased in size from 1.7 cm on 09/02/2020 outside chest CT, increased in size from 1.0 cm and decreased in uptake with max SUV 6.2 on 04/26/2020 PET-CT. Hypermetabolic right hilar lymph node with max SUV 5.2, previous max SUV 8.3 on 04/26/2020 PET-CT, decreased. No new enlarged or hypermetabolic axillary, mediastinal or hilar lymph nodes. Mildly hypermetabolic solid 1.2 cm right upper lobe pulmonary nodule with max SUV 3.0 (series 3/image 112), previously 1.2 cm with max SUV 4.2, stable in size and mildly decreased in metabolism. Low level nonfocal hypermetabolism associated with new patchy ground-glass opacity in the peripheral right upper lobe (series 3/image 102), presumably inflammatory. No additional hypermetabolic pulmonary findings. Incidental CT findings: Coronary atherosclerosis. Atherosclerotic nonaneurysmal thoracic aorta. Mild centrilobular emphysema. ABDOMEN/PELVIS: No abnormal hypermetabolic activity within the liver,  pancreas, adrenal glands, or spleen. No hypermetabolic lymph nodes in the abdomen or pelvis. Incidental CT findings: Simple 1.8 cm left liver cyst. Atherosclerotic nonaneurysmal abdominal aorta. Simple 5.1 cm medial upper right renal cyst. Well-positioned  percutaneous gastrostomy tube terminating in the body of the stomach. Mild diffuse colonic diverticulosis. SKELETON: No focal hypermetabolic activity to suggest skeletal metastasis. Incidental CT findings: none IMPRESSION: 1. Hypermetabolic soft tissue metastasis at the right thoracic inlet, decreased in size since outside 09/02/2020 neck CT, increased in size and similar in uptake compared to 04/26/2020 PET-CT. 2. Hypermetabolic right paratracheal and right hilar nodal metastases, decreased in size from outside 09/02/2020 chest CT, decreased in FDG uptake since 04/26/2020 PET-CT. 3. Mildly hypermetabolic right upper lobe solid pulmonary nodule, stable in size and mildly decreased in metabolism since 04/26/2020 PET-CT. 4. No new or progressive hypermetabolic metastatic disease. 5. Chronic findings include: Aortic Atherosclerosis (ICD10-I70.0) and Emphysema (ICD10-J43.9). Coronary atherosclerosis. Mild diffuse colonic diverticulosis. Electronically Signed   By: Ilona Sorrel M.D.   On: 11/10/2020 10:20    Assessment and plan- Patient is a 64 y.o. 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  No evidence of infection today.  Suspect pathologic drainage.  Site cleaned and redressed.  Flushed pack which flushes easily and provided teaching.  Continue to use her feeding as prescribed.  Return to clinic if symptoms do not improve or worsen in the interim.  Follow-up with radiation oncology as scheduled.   Visit Diagnosis 1. Leaking PEG tube [facility])     Patient expressed understanding and was in agreement with this plan. He  also understands that He can call clinic at any time with any questions, concerns, or complaints.   Thank you for allowing me to participate in the care of this very pleasant patient.   Beckey Rutter, DNP, AGNP-[facility] [place] [facility] [place]

## 2020-12-02 NOTE — Patient Instructions (Signed)
PEG Tube Home Guide  A percutaneous endoscopic gastrostomy (PEG) tube is used to deliver food and fluids directly into the stomach. The tube has a clamp, a cap, and two anchors (bolsters). One bolster keeps the tube from coming out of the stomach. The other bolster holds the tube against the abdomen. You will be taught how to use and adjust your PEG tube before you leave the hospital. You will also be taught how to care for the opening (stoma) in your abdomen. Make sure that you understand:  How to care for your PEG tube.  How to care for your stoma.  How to give yourself feedings and medicines.  When to call your health care provider for help. Supplies needed:  Soapy water.  Clean, plain water.  Clean washcloth.  Bandage (dressing). This is optional.  Syringe. How to care for a PEG tube Check your PEG tube every day. Make sure:  It is not too tight. The bolster should rest gently over the stoma.  It is in the correct position. There is a mark on the tube that shows when it is in the correct position. Adjust the tube if you need to. Cleaning your stoma Clean your stoma every day. Follow these steps: 1. Wash your hands with soap and water. If soap and water are not available, use hand sanitizer. 2. Check the skin around the stoma for redness, rash, swelling, drainage, or extra tissue growth. If you notice any of these, call your health care provider. 3. Wash the stoma and the skin around it using a clean, soft washcloth. Clean using a circular motion, and wipe away from the stoma opening, not toward it. ? Use warm, soapy water, and only use cleansers recommended by your health care provider. ? Rinse the stoma area with plain water. ? Pat the stoma area dry. 4. Place a dressing over the stoma if your health care provider told you to do that.  Giving a feeding Your health care provider will give you instructions about:  How much nutrition and fluid you will need for each  feeding.  How often to have a feeding.  Whether to take medicine in the tube by itself or with a feeding. To give yourself a feeding, follow these steps: 1. Lay out all of the equipment that you will need. 2. Make sure that the nutritional formula is at room temperature. 3. Wash your hands with soap and water. 4. Position yourself so that you are upright. You will need to stay upright throughout the feeding and for at least 30 minutes after the feeding. 5. Make sure the syringe plunger is pushed in. Place the tip of the syringe in clean water, and slowly pull the plunger to bring (draw up) the water into the syringe. 6. Remove the clamp and the cap from the PEG tube. 7. Push the water out of the syringe to clean (flush) the tube. 8. If the tube is clear, draw up the formula into the syringe. Make sure to use the right amount for each feeding and add water if necessary. 9. Slowly push the formula from the syringe through the tube. 10. After the feeding, flush the tube with water. 11. Put the clamp and the cap on the tube. Giving medicine To give yourself medicine, follow these steps: 1. Lay out all of the equipment that you will need. 2. If your medicine is in tablet form, crush the tablet and dissolve it in water. 3. Wash your hands with soap   and water. 4. Position yourself so that you are upright. You will need to stay upright while you give yourself medicine and for at least 30 minutes afterward. 5. Make sure the syringe plunger is pushed in. Place the tip of the syringe in clean water, and slowly pull the plunger to bring (draw up) the water into the syringe. 6. Remove the clamp and the cap from the PEG tube. 7. Push the water out of the syringe to clean (flush) the tube. 8. If the tube is clear, draw up the medicine into the syringe. 9. Slowly push the medicine from the syringe through the tube. 10. Flush the tube with water. 11. Put the clamp and the cap on the tube. Do not take  sustained release (SR) medicines through your tube. If you are unsure if your medicine is an SR medicine, ask your health care provider or pharmacist. Contact a health care provider if you have:  Soreness, redness, or irritation around your stoma.  Abdominal pain or bloating during or after your feedings.  Nausea, constipation, or diarrhea that will not go away.  A fever.  Problems with your PEG tube. Get help right away if:  Your tube is blocked.  Your tube falls out.  You have pain around your stoma.  You are bleeding from your stoma.  Your tube is leaking.  You choke or you have trouble breathing during or after a feeding. Summary  A percutaneous endoscopic gastrostomy (PEG) tube is used to deliver food and fluids directly into the stomach.  You will be taught how to use and adjust your PEG tube. You will also be taught how to care for the stoma in your abdomen.  Your health care provider will give you instructions on how to give yourself nutritional formula and medicines through your PEG tube.  Contact your health care provider if you have a fever or soreness, redness, or irritation around your stoma.  Get help right away if your tube leaks, is blocked, or falls out. Get help right away if you have pain or bleeding around your stoma. This information is not intended to replace advice given to you by your health care provider. Make sure you discuss any questions you have with your health care provider. Document Revised: 02/06/2019 Document Reviewed: 12/03/2017 Elsevier Patient Education  2020 [employer]  

## 2020-12-02 NOTE — Progress Notes (Signed)
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 S/p PEG placement on 10/22.  Patient has resumed radiation. Holding on starting [place] until after radiation  Met with patient in clinic following radiation. Patient reports that he is tolerating mostly 4 cartons of [employer] feeding especially over the holidays.  Has sometimes given 5 cartons.  Reports that he is eating some foods orally.  Having some problems swallowing solid foods but liquids go down well.      Reports some irration around PEG site and wants NP or nursing to look at site today.   Medications: reviewed  Labs: reviewed  Anthropometrics:   Weight 179 lb 8 oz on 12/13 stable  178 lb 12.8 oz on 12/2 179 lb on 11/10   Estimated Energy Needs  Kcals: 2100-2500 Protein: 105-125 g Fluid: > 2.1 L  NUTRITION DIAGNOSIS: Inadequate oral intake but relying on feeding tube   INTERVENTION:  Patient to continue [employer] 1.4, 4-5 cartons per day pending ability to eat orally.  Patient drinking fluids orally. Flushing tube before and after feeding. Nursing informed of patient wanting NP or nursing to look at feeding tube site.      MONITORING, EVALUATION, GOAL: weight trends, intake, tube feeding   NEXT VISIT: phone call in about 4-6 weeks  Delynda Sepulveda [name], [place], [place] Registered Dietitian [phone] (mobile)

## 2020-12-06 ENCOUNTER — Ambulatory Visit
Admission: RE | Admit: 2020-12-06 | Discharge: 2020-12-06 | Disposition: A | Discharge status: Home / Self Care | Payer: Medicare Other | Source: Ambulatory Visit | Attending: Radiation Oncology | Admitting: Radiation Oncology

## 2020-12-06 DIAGNOSIS — C01 Malignant neoplasm of base of tongue: Secondary | ICD-10-CM | POA: Diagnosis present

## 2020-12-06 DIAGNOSIS — Z510 Encounter for antineoplastic radiation therapy: Secondary | ICD-10-CM | POA: Insufficient documentation

## 2020-12-07 ENCOUNTER — Ambulatory Visit
Admission: RE | Admit: 2020-12-07 | Discharge: 2020-12-07 | Disposition: A | Discharge status: Home / Self Care | Payer: Medicare Other | Source: Ambulatory Visit | Attending: Radiation Oncology | Admitting: Radiation Oncology

## 2020-12-07 ENCOUNTER — Telehealth: Payer: Self-pay

## 2020-12-07 ENCOUNTER — Inpatient Hospital Stay: Payer: Medicare Other | Attending: Oncology

## 2020-12-07 DIAGNOSIS — C109 Malignant neoplasm of oropharynx, unspecified: Secondary | ICD-10-CM | POA: Diagnosis not present

## 2020-12-07 DIAGNOSIS — C7989 Secondary malignant neoplasm of other specified sites: Secondary | ICD-10-CM | POA: Diagnosis not present

## 2020-12-07 DIAGNOSIS — T451X5A Adverse effect of antineoplastic and immunosuppressive drugs, initial encounter: Secondary | ICD-10-CM | POA: Insufficient documentation

## 2020-12-07 DIAGNOSIS — Z510 Encounter for antineoplastic radiation therapy: Secondary | ICD-10-CM | POA: Diagnosis not present

## 2020-12-07 DIAGNOSIS — C7800 Secondary malignant neoplasm of unspecified lung: Secondary | ICD-10-CM | POA: Diagnosis not present

## 2020-12-07 DIAGNOSIS — R1310 Dysphagia, unspecified: Secondary | ICD-10-CM | POA: Diagnosis not present

## 2020-12-07 DIAGNOSIS — Z8052 Family history of malignant neoplasm of bladder: Secondary | ICD-10-CM | POA: Insufficient documentation

## 2020-12-07 DIAGNOSIS — G620 Drug-induced polyneuropathy: Secondary | ICD-10-CM | POA: Diagnosis not present

## 2020-12-07 DIAGNOSIS — D649 Anemia, unspecified: Secondary | ICD-10-CM | POA: Insufficient documentation

## 2020-12-07 DIAGNOSIS — Z803 Family history of malignant neoplasm of breast: Secondary | ICD-10-CM | POA: Diagnosis not present

## 2020-12-07 DIAGNOSIS — Z931 Gastrostomy status: Secondary | ICD-10-CM | POA: Insufficient documentation

## 2020-12-07 DIAGNOSIS — Z808 Family history of malignant neoplasm of other organs or systems: Secondary | ICD-10-CM | POA: Diagnosis not present

## 2020-12-07 LAB — COMPREHENSIVE METABOLIC PANEL
ALT: 18 U/L (ref 0–44)
AST: 16 U/L (ref 15–41)
Albumin: 3.7 g/dL (ref 3.5–5.0)
Alkaline Phosphatase: 69 U/L (ref 38–126)
Anion gap: 9 (ref 5–15)
BUN: 22 mg/dL (ref 8–23)
CO2: 27 mmol/L (ref 22–32)
Calcium: 9.1 mg/dL (ref 8.9–10.3)
Chloride: 99 mmol/L (ref 98–111)
Creatinine, Ser: 0.98 mg/dL (ref 0.61–1.24)
GFR, Estimated: >60 mL/min (ref >60)
Glucose, Bld: 108 mg/dL — ABNORMAL HIGH (ref 70–99)
Potassium: 4 mmol/L (ref 3.5–5.1)
Sodium: 135 mmol/L (ref 135–145)
Total Bilirubin: 0.6 mg/dL (ref 0.3–1.2)
Total Protein: 6.8 g/dL (ref 6.5–8.1)

## 2020-12-07 LAB — CBC WITH DIFFERENTIAL/PLATELET
Abs Immature Granulocytes: 0.01 10*3/uL (ref 0.00–0.07)
Basophils Absolute: 0.1 10*3/uL (ref 0.0–0.1)
Basophils Relative: 1 %
Eosinophils Absolute: 0.2 10*3/uL (ref 0.0–0.5)
Eosinophils Relative: 2 %
HCT: 40.8 % (ref 39.0–52.0)
Hemoglobin: 13.5 g/dL (ref 13.0–17.0)
Immature Granulocytes: 0 %
Lymphocytes Relative: 6 %
Lymphs Abs: 0.4 10*3/uL — ABNORMAL LOW (ref 0.7–4.0)
MCH: 30.1 pg (ref 26.0–34.0)
MCHC: 33.1 g/dL (ref 30.0–36.0)
MCV: 91.1 fL (ref 80.0–100.0)
Monocytes Absolute: 0.6 10*3/uL (ref 0.1–1.0)
Monocytes Relative: 9 %
Neutro Abs: 6 10*3/uL (ref 1.7–7.7)
Neutrophils Relative %: 82 %
Platelets: 273 10*3/uL (ref 150–400)
RBC: 4.48 MIL/uL (ref 4.22–5.81)
RDW: 15.7 % — ABNORMAL HIGH (ref 11.5–15.5)
WBC: 7.3 10*3/uL (ref 4.0–10.5)
nRBC: 0 % (ref 0.0–0.2)

## 2020-12-07 MED ORDER — HEPARIN SOD (PORK) LOCK FLUSH 100 UNIT/ML IV SOLN
500.0000 [IU] | Freq: Once | INTRAVENOUS | Status: AC
  Administered 2020-12-07: 500 [IU] via INTRAVENOUS
  Filled 2020-12-07: qty 5

## 2020-12-07 MED ORDER — SODIUM CHLORIDE 0.9% FLUSH
10.0000 mL | INTRAVENOUS | Status: DC | PRN
  Administered 2020-12-07: 10 mL via INTRAVENOUS
  Filled 2020-12-07: qty 10

## 2020-12-07 MED ORDER — HEPARIN SOD (PORK) LOCK FLUSH 100 UNIT/ML IV SOLN
INTRAVENOUS | Status: AC
  Filled 2020-12-07: qty 5

## 2020-12-07 NOTE — Telephone Encounter (Signed)
Done.. Pt was made aware.. 

## 2020-12-07 NOTE — Progress Notes (Signed)
Stable at discharge 

## 2020-12-07 NOTE — Telephone Encounter (Signed)
Patient will have final RT on 1/13.  Please schedule lab/MD (cbc,cmp) a few days after he finishes RT and notify pt of appts. Thanks

## 2020-12-08 ENCOUNTER — Ambulatory Visit
Admission: RE | Admit: 2020-12-08 | Discharge: 2020-12-08 | Disposition: A | Discharge status: Home / Self Care | Payer: Medicare Other | Source: Ambulatory Visit | Attending: Radiation Oncology | Admitting: Radiation Oncology

## 2020-12-08 DIAGNOSIS — Z510 Encounter for antineoplastic radiation therapy: Secondary | ICD-10-CM | POA: Diagnosis not present

## 2020-12-09 ENCOUNTER — Ambulatory Visit
Admission: RE | Admit: 2020-12-09 | Discharge: 2020-12-09 | Disposition: A | Discharge status: Home / Self Care | Payer: Medicare Other | Source: Ambulatory Visit | Attending: Radiation Oncology | Admitting: Radiation Oncology

## 2020-12-09 ENCOUNTER — Inpatient Hospital Stay (HOSPITAL_BASED_OUTPATIENT_CLINIC_OR_DEPARTMENT_OTHER): Payer: Medicare Other | Admitting: Hospice and Palliative Medicine

## 2020-12-09 DIAGNOSIS — Z510 Encounter for antineoplastic radiation therapy: Secondary | ICD-10-CM | POA: Diagnosis not present

## 2020-12-09 DIAGNOSIS — C109 Malignant neoplasm of oropharynx, unspecified: Secondary | ICD-10-CM

## 2020-12-09 DIAGNOSIS — Z515 Encounter for palliative care: Secondary | ICD-10-CM | POA: Diagnosis not present

## 2020-12-09 NOTE — Progress Notes (Signed)
Virtual Visit via Telephone Note  I connected with [name]. on 12/09/20 at  1:00 PM EST by telephone and verified that I am speaking with the correct person using two identifiers.  Location: Patient: Home Provider: Clinic   I discussed the limitations, risks, security and privacy concerns of performing an evaluation and management service by telephone and the availability of in person appointments. I also discussed with the patient that there may be a patient responsible charge related to this service. The patient expressed understanding and agreed to proceed.   History of Present Illness: Ivan Garcia. is a 65 y.o. 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  Most recently on docetaxel.  Patient is referred to palliative care to help address goals and manage ongoing symptoms.   Observations/Objective: Patient was seen in [facility] on 12/02/2020 with concerns for concern of PEG site infection.  However, there is no evidence of infection.  He instead had drainage around the tube.  Today, patient reports that this is improved as he is better clamping the PEG.  He denies any other significant changes or concerns.  No symptomatic complaints at present.  He asked how long the PEG would need to be in place.  Patient is still having dysphagia and would be unable to maintain caloric intake without the PEG.  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  No symptomatic complaints at present.  Patient feels he is doing reasonably well.  Nutrition -has PEG and is supplementing oral intake with PEG feedings.  He is followed by nutritionist.  Follow Up Instructions: Follow-up MyChart visit 1 to 2 months   I discussed the assessment and treatment plan with the patient. The patient was provided an  opportunity to ask questions and all were answered. The patient agreed with the plan and demonstrated an understanding of the instructions.   The patient was advised to call back or seek an in-person evaluation if the symptoms worsen or if the condition fails to improve as anticipated.  I provided 5 minutes of non-face-to-face time during this encounter.   Malachy Moan, NP

## 2020-12-10 ENCOUNTER — Ambulatory Visit
Admission: RE | Admit: 2020-12-10 | Discharge: 2020-12-10 | Disposition: A | Discharge status: Home / Self Care | Payer: Medicare Other | Source: Ambulatory Visit | Attending: Radiation Oncology | Admitting: Radiation Oncology

## 2020-12-10 DIAGNOSIS — Z510 Encounter for antineoplastic radiation therapy: Secondary | ICD-10-CM | POA: Diagnosis not present

## 2020-12-13 ENCOUNTER — Ambulatory Visit
Admission: RE | Admit: 2020-12-13 | Discharge: 2020-12-13 | Disposition: A | Discharge status: Home / Self Care | Payer: Medicare Other | Source: Ambulatory Visit | Attending: Radiation Oncology | Admitting: Radiation Oncology

## 2020-12-13 DIAGNOSIS — Z510 Encounter for antineoplastic radiation therapy: Secondary | ICD-10-CM | POA: Diagnosis not present

## 2020-12-14 ENCOUNTER — Ambulatory Visit
Admission: RE | Admit: 2020-12-14 | Discharge: 2020-12-14 | Disposition: A | Discharge status: Home / Self Care | Payer: Medicare Other | Source: Ambulatory Visit | Attending: Radiation Oncology | Admitting: Radiation Oncology

## 2020-12-14 DIAGNOSIS — Z510 Encounter for antineoplastic radiation therapy: Secondary | ICD-10-CM | POA: Diagnosis not present

## 2020-12-15 ENCOUNTER — Ambulatory Visit
Admission: RE | Admit: 2020-12-15 | Discharge: 2020-12-15 | Disposition: A | Discharge status: Home / Self Care | Payer: Medicare Other | Source: Ambulatory Visit | Attending: Radiation Oncology | Admitting: Radiation Oncology

## 2020-12-15 DIAGNOSIS — Z510 Encounter for antineoplastic radiation therapy: Secondary | ICD-10-CM | POA: Diagnosis not present

## 2020-12-16 ENCOUNTER — Ambulatory Visit
Admission: RE | Admit: 2020-12-16 | Discharge: 2020-12-16 | Disposition: A | Discharge status: Home / Self Care | Payer: Medicare Other | Source: Ambulatory Visit | Attending: Radiation Oncology | Admitting: Radiation Oncology

## 2020-12-16 DIAGNOSIS — Z510 Encounter for antineoplastic radiation therapy: Secondary | ICD-10-CM | POA: Diagnosis not present

## 2020-12-22 ENCOUNTER — Encounter: Payer: Self-pay | Admitting: Oncology

## 2020-12-22 ENCOUNTER — Inpatient Hospital Stay: Payer: Medicare Other

## 2020-12-22 ENCOUNTER — Inpatient Hospital Stay (HOSPITAL_BASED_OUTPATIENT_CLINIC_OR_DEPARTMENT_OTHER): Payer: Medicare Other | Admitting: Oncology

## 2020-12-22 VITALS — BP 142/95 | HR 99 | Temp 97.8°F | Resp 18 | Wt 182.5 lb

## 2020-12-22 DIAGNOSIS — G620 Drug-induced polyneuropathy: Secondary | ICD-10-CM | POA: Diagnosis not present

## 2020-12-22 DIAGNOSIS — Z95828 Presence of other vascular implants and grafts: Secondary | ICD-10-CM

## 2020-12-22 DIAGNOSIS — D649 Anemia, unspecified: Secondary | ICD-10-CM

## 2020-12-22 DIAGNOSIS — R1310 Dysphagia, unspecified: Secondary | ICD-10-CM

## 2020-12-22 DIAGNOSIS — C109 Malignant neoplasm of oropharynx, unspecified: Secondary | ICD-10-CM

## 2020-12-22 DIAGNOSIS — T451X5A Adverse effect of antineoplastic and immunosuppressive drugs, initial encounter: Secondary | ICD-10-CM

## 2020-12-22 DIAGNOSIS — Z5111 Encounter for antineoplastic chemotherapy: Secondary | ICD-10-CM

## 2020-12-22 LAB — CBC WITH DIFFERENTIAL/PLATELET
Abs Immature Granulocytes: 0.03 10*3/uL (ref 0.00–0.07)
Basophils Absolute: 0 10*3/uL (ref 0.0–0.1)
Basophils Relative: 0 %
Eosinophils Absolute: 0.1 10*3/uL (ref 0.0–0.5)
Eosinophils Relative: 1 %
HCT: 43.4 % (ref 39.0–52.0)
Hemoglobin: 14.5 g/dL (ref 13.0–17.0)
Immature Granulocytes: 0 %
Lymphocytes Relative: 4 %
Lymphs Abs: 0.4 10*3/uL — ABNORMAL LOW (ref 0.7–4.0)
MCH: 30.5 pg (ref 26.0–34.0)
MCHC: 33.4 g/dL (ref 30.0–36.0)
MCV: 91.4 fL (ref 80.0–100.0)
Monocytes Absolute: 0.9 10*3/uL (ref 0.1–1.0)
Monocytes Relative: 9 %
Neutro Abs: 8.2 10*3/uL — ABNORMAL HIGH (ref 1.7–7.7)
Neutrophils Relative %: 86 %
Platelets: 283 10*3/uL (ref 150–400)
RBC: 4.75 MIL/uL (ref 4.22–5.81)
RDW: 14.9 % (ref 11.5–15.5)
WBC: 9.7 10*3/uL (ref 4.0–10.5)
nRBC: 0 % (ref 0.0–0.2)

## 2020-12-22 LAB — COMPREHENSIVE METABOLIC PANEL
ALT: 19 U/L (ref 0–44)
AST: 17 U/L (ref 15–41)
Albumin: 3.6 g/dL (ref 3.5–5.0)
Alkaline Phosphatase: 63 U/L (ref 38–126)
Anion gap: 9 (ref 5–15)
BUN: 28 mg/dL — ABNORMAL HIGH (ref 8–23)
CO2: 29 mmol/L (ref 22–32)
Calcium: 9.5 mg/dL (ref 8.9–10.3)
Chloride: 99 mmol/L (ref 98–111)
Creatinine, Ser: 1.08 mg/dL (ref 0.61–1.24)
GFR, Estimated: >60 mL/min (ref >60)
Glucose, Bld: 87 mg/dL (ref 70–99)
Potassium: 4.1 mmol/L (ref 3.5–5.1)
Sodium: 137 mmol/L (ref 135–145)
Total Bilirubin: 0.2 mg/dL — ABNORMAL LOW (ref 0.3–1.2)
Total Protein: 7 g/dL (ref 6.5–8.1)

## 2020-12-22 LAB — MAGNESIUM: Magnesium: 2.1 mg/dL (ref 1.7–2.4)

## 2020-12-22 NOTE — Progress Notes (Signed)
Hematology/Oncology  Follow Up note [facility] [phone] [phone]) [phone]   Patient Care Team: Albina Billet, MD as PCP - General (Internal Medicine) Albina Billet, MD (Internal Medicine) Bary Castilla, Forest Gleason, MD (General Surgery) Noreene Filbert, MD as Referring Physician (Radiation Oncology) Earlie Server, MD as Consulting Physician (Oncology)  CHIEF COMPLAINTS/PURPOSE OF CONSULTATION:  Follow up for head and neck cancer.  HISTORY OF PRESENTING ILLNESS:  Ivan Garcia. is a  65 y.o.  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 P16 positive. cT2 cN2 disease, stage II, # Concurrent ChemoRT  Cisplatin 100 mg/m2 q3weeks with concurrent RT which started on 12/31/2017.  S/p one dose of Cisplatin. Cisplatin discontinued due to nephrotoxicity. Switch to weekly Carboplatin (AUC 2) / Taxol 58m/m2 [ finished May 2019] Patient has finished concurrent chemoradiation in May 2019  # He reports doing well until in July 2020, he started to have hoarseness after singing for 2 to 3 hours. He has changed his insurance and Dr. MTami Ribasis no longer covered by his current insurance.  So he establish care with Dr.Sherryll Skoczylas [place]n [place]/2019. Flexible laryngoscopy showed Vocal cord paralysis.  Patient was recommended to have a PET scan done for restaging. Patient's primary care provider Dr. THall Busingordered a PET scan for patient which was done on 08/20/2019. 08/20/2019 PET scan showed asymmetric hypermetabolic him involving the left vocal cords with hypermetabolic adenopathy in the neck, and the chest.  He also has hypermetabolic pulmonary nodule along the minor fissure which is felt to be metastatic. Tiny proximal left ureteral stone without hydronephrosis.  Possible gallbladder sludge.  Enlarged prostate.   Atherosclerosis.  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  # 09/08/2019 Omniseq NGS showed PD-L1 CPS 20, TMB indeterminant, MSI stable,PIK3CA E542K, E545K  Started on chemotherapy with 6 cycles of carboplatin, 5-FU and Keytruda, finished in February 2021.  And has been on Keytruda maintenance. Initially he had a good response, however progressed whiled on Keytruda maintenance.  PET 04/26/2020 worsening of disease in the chest.  Interval enlargement of the right thoracic inlet and paratracheal lymph nodes that were previously enlarged with new activity in the right hilum corresponding to a lymph node in this location.  Also increased activity in the right upper lobe nodule. Right level 3 lymph node in the neck and anterior mediastinal nodal activity is diminished.  These areas are quite small on prior study. Diminished symmetric uptake within the left as compared to the right vocal cord. Right mandible increased activity in the area of third molar.  This is consistent with his recent tooth infection.  # [facility] [place]  [facility].  Patient was offered for clinical trial with pembrolizumab/lenvatinib versus standard therapy. Patient received additional 1 cycle of Keytruda while waiting to be enrolled to the trial.   #Disease progression.  [facility] right upper paratracheal node is concerning for metastatic disease and a new from 01/20/2020 PET/CT.  The node closely abuts and possibly invade the posterolateral right tracheal wall as well as the esophagus.  Right vocal cord paralysis.  Symmetric soft tissue density at the right base of tongue could reflect residual/recurrent primary  malignancy versus posttreatment changes.  Right supraclavicular lymph node is no longer appreciated  with post treatment changes.  There is increased size of right minor fissure nodule which was metabolically avid on previous PET scan.  New mediastinal and hilar lymphadenopathy concerning for new nodal disease.  No evidence of metastatic disease below the diaphragm.  Nonobstructing 3 mm distal ureter stone.  07/07/2020 started on palliative chemotherapy with docetaxel 09/02/2020 3 cycles of docetaxel  with G-CSF support.    CT scan done at [facility] which unfortunately showed progression of disease. Images are not available for me to review.  CT was compared to June 24, 2020 CT done at [facility]. 09/02/2020,chest without contrast with 3D MIPS protocol showed Interval increase in the size of the right upper lobe perifissural nodule 1.6 x 1.5 cm-previously 1.4 x 1.2 cm tracheoesophageal soft tissue mass 3.4 x 5.0 cm with regional mass-effect on the esophagus-previously 2.4 x 3.6 cm, and mediastinal lymphadenopathy-right upper paratracheal lymph node 1.9 cm-previously 1.5 cm, suspicious for progressive metastatic disease. Subcentimeter lung nodules are stable. CT neck with contrast showed slight interval increase in size of upper right paratracheal nodal conglomerate.  Questionable soft tissue invasion of the posterior tracheal appears worsened.  Compression with potential invasion of the esophagus is again noted.  Prior vocal cord paralysis again noted.  Unchanged right tongue base asymmetry. I had a discussion with patient's [facility] oncologist Dr. Barrington Ellison over the phone. Dr.Choe recommends adding carboplatin AUC 5 to docetaxel regimen for now which hopefully to help to reduce the size of the tracheoesophageal soft tissue mass.  Also recommend palliative radiation. 09/07/2020, carboplatin AUC 5+ docetaxel S/p PEG tube placement on 09/23/2020 #09/28/2020, started on palliative radiation to esophageal  #Patient establish care with [facility] Dr. Maxie Better   09/29/2020  guardant 360 which showed PIK3CA E545K, PIK3CA  amplification. MSI stable, APC E2172K VUS , APC L2138L, FGFR2 R573 VUS   .  INTERVAL HISTORY Einer Meals Haiden Rawlinson. is a 65 y.o. male who presents for follow-up for metastatic squamous cell carcinoma, P 16+.   Patient reports that he has  10/25/20 patient finished palliative radiation to esophageal mass. 11/09/2020, patient had a PET scan which showed partial response Right thoracic inlet Hypermetabolic soft tissue metastasis, 3.9 x 2.6cm, decreased in size, decreased size of  Hypermetabolic right paratracheal 1.2cm, and right hilar nodal Metastases since outside 09/02/2020 neck CT Right upper lobe lung nodule 1.2cm, stable size.  Dr. Baruch Gouty recommend additional radiation. 11/23/20-12/17/19 patient finished additional radiation  Today patient reports that his dysphagia has gotten better after the first round of radiation.  Dysphagia symptoms slightly worse by the end of the second round of radiation.  He continues to do tube feeding through his PEG tube. His weight is stable, gained 2 pounds since last visit He has some chronic intermittent cough.  No fever, chills, shortness of breath.  Review of Systems  Constitutional: Negative for chills, fever, malaise/fatigue and weight loss.  HENT: Negative for sore throat.        Odynophagia  Eyes: Negative for redness.  Respiratory: Negative for cough, shortness of breath and wheezing.   Cardiovascular: Negative for chest pain, palpitations and leg swelling.  Gastrointestinal: Negative for abdominal pain, blood in stool, nausea and vomiting.       PEG tube placement  Genitourinary: Negative for dysuria.  Musculoskeletal: Negative for myalgias.  Skin: Negative for rash.  Neurological: Negative for dizziness, tingling and tremors.  Endo/Heme/Allergies: Does not bruise/bleed easily.  Psychiatric/Behavioral: Negative for  hallucinations.    MEDICAL HISTORY:  Past Medical History:  Diagnosis Date  . Arthritis   . Cancer ([place])    Head and  neck cancer  . Hemorrhoids   . Hyperlipidemia   . Hypertension     SURGICAL HISTORY: Past Surgical History:  Procedure Laterality Date  . COLONOSCOPY WITH PROPOFOL N/A 04/04/2017   Procedure: COLONOSCOPY WITH PROPOFOL;  Surgeon: Robert Bellow, MD;  Location: [facility] ENDOSCOPY;  Service: Endoscopy;  Laterality: N/A;  . IR GASTROSTOMY TUBE MOD SED  09/24/2020  . MANDIBLE SURGERY    . PORT-A-CATH REMOVAL  07/17/2018  . PORTA CATH INSERTION N/A 12/19/2017   Procedure: PORTA CATH INSERTION;  Surgeon: Algernon Huxley, MD;  Location: [place] CV LAB;  Service: Cardiovascular;  Laterality: N/A;  . PORTA CATH INSERTION N/A 07/17/2018   Procedure: PORTA CATH INSERTION;  Surgeon: Algernon Huxley, MD;  Location: [place] CV LAB;  Service: Cardiovascular;  Laterality: N/A;  . PORTA CATH INSERTION N/A 09/18/2019   Procedure: PORTA CATH INSERTION;  Surgeon: Algernon Huxley, MD;  Location: [place] CV LAB;  Service: Cardiovascular;  Laterality: N/A;  . TONSILLECTOMY      SOCIAL HISTORY: Social History   Socioeconomic History  . Marital status: Single    Spouse name: Not on file  . Number of children: Not on file  . Years of education: Not on file  . Highest education level: Not on file  Occupational History  . Not on file  Tobacco Use  . Smoking status: Never Smoker  . Smokeless tobacco: Never Used  Vaping Use  . Vaping Use: Never used  Substance and Sexual Activity  . Alcohol use: No  . Drug use: No  . Sexual activity: Not on file  Other Topics Concern  . Not on file  Social History Narrative  . Not on file   Social Determinants of Health   Financial Resource Strain: Not on file  Food Insecurity: Not on file  Transportation Needs: Not on file  Physical Activity: Not on file  Stress: Not on file  Social Connections: Not on file  Intimate Partner Violence: Not on file    FAMILY HISTORY: Family History  Problem Relation Age of Onset  . Breast cancer Mother   . Asthma  Mother   . Congestive Heart Failure Mother   . Prostate cancer Father   . Brain cancer Father   . Bladder Cancer Father     ALLERGIES:  has No Known Allergies.  MEDICATIONS:  Current Outpatient Medications  Medication Sig Dispense Refill  . amLODipine (NORVASC) 5 MG tablet Take 5 mg by mouth daily.    Marland Kitchen omeprazole (PRILOSEC) 20 MG capsule Take 1 capsule (20 mg total) by mouth daily. 90 capsule 1  . rosuvastatin (CRESTOR) 10 MG tablet Take 10 mg by mouth every evening.  4  . sucralfate (CARAFATE) 1 g tablet Take 1 tablet (1 g total) by mouth 3 (three) times daily. Dissolve in 3-4 tbsp warm water, swish and swallow. 90 tablet 3  . dexamethasone (DECADRON) 1 MG tablet Take 1 mg by mouth 2 (two) times daily with a meal. (Patient not taking: No sig reported)    . lidocaine (XYLOCAINE) 2 % solution  (Patient not taking: Reported on 12/22/2020)    . lidocaine-prilocaine (EMLA) cream Apply to affected area once (Patient not taking: Reported on 12/22/2020) 30 g 3  . morphine (ROXANOL) 20 MG/ML concentrated solution Take 1 mL (20 mg total) by mouth  every 4 (four) hours as needed for moderate pain or severe pain. (Patient not taking: No sig reported) 30 mL 0  . nystatin (MYCOSTATIN) 100000 UNIT/ML suspension Take 5 mLs (500,000 Units total) by mouth in the morning, at noon, and at bedtime. SWISH AND SPIT (Patient not taking: Reported on 12/22/2020) 473 mL 0  . ondansetron (ZOFRAN) 8 MG tablet Take 1 tablet (8 mg total) by mouth 2 (two) times daily as needed for refractory nausea / vomiting. Start on day 3 after carboplatin chemo. (Patient not taking: Reported on 12/22/2020) 30 tablet 1  . prochlorperazine (COMPAZINE) 10 MG tablet Take 1 tablet (10 mg total) by mouth every 6 (six) hours as needed (Nausea or vomiting). (Patient not taking: Reported on 12/22/2020) 30 tablet 1  . Sennosides (SENNA) 8.8 MG/5ML LIQD Take 17.6 mg by mouth daily. (Patient not taking: Reported on 12/22/2020) 236 mL 1   No current  facility-administered medications for this visit.   Facility-Administered Medications Ordered in Other Visits  Medication Dose Route Frequency Provider Last Rate Last Admin  . sodium chloride flush (NS) 0.9 % injection 10 mL  10 mL Intravenous PRN Earlie Server, MD      . sodium chloride flush (NS) 0.9 % injection 10 mL  10 mL Intravenous PRN Earlie Server, MD   10 mL at 10/13/20 1034     PHYSICAL EXAMINATION: ECOG PERFORMANCE STATUS: 1 - Symptomatic but completely ambulatory Vitals:   12/22/20 1058  BP: (!) 142/95  Pulse: 99  Resp: 18  Temp: 97.8 F (36.6 C)   Physical Exam Constitutional:      General: He is not in acute distress.    Appearance: He is not diaphoretic.  HENT:     Head: Normocephalic and atraumatic.     Nose: Nose normal.     Mouth/Throat:     Pharynx: No oropharyngeal exudate.  Eyes:     General: No scleral icterus.    Pupils: Pupils are equal, round, and reactive to light.  Neck:     Comments: Palpable right cervical lymphadenopathy Cardiovascular:     Rate and Rhythm: Normal rate and regular rhythm.     Heart sounds: No murmur heard.   Pulmonary:     Effort: Pulmonary effort is normal. No respiratory distress.     Breath sounds: No rales.  Chest:     Chest wall: No tenderness.  Abdominal:     General: There is no distension.     Palpations: Abdomen is soft.     Tenderness: There is no abdominal tenderness.  Musculoskeletal:        General: Normal range of motion.     Cervical back: Normal range of motion and neck supple.  Skin:    General: Skin is warm and dry.     Findings: No erythema.  Neurological:     Mental Status: He is alert and oriented to person, place, and time.     Cranial Nerves: No cranial nerve deficit.     Motor: No abnormal muscle tone.     Coordination: Coordination normal.  Psychiatric:        Mood and Affect: Affect normal.      LABORATORY DATA:  I have reviewed the data as listed Lab Results  Component Value Date   WBC  9.7 12/22/2020   HGB 14.5 12/22/2020   HCT 43.4 12/22/2020   MCV 91.4 12/22/2020   PLT 283 12/22/2020   Recent Labs    08/17/20 0804 08/23/20 1414 08/31/20  1244 09/07/20 0833 11/04/20 0822 12/07/20 1515 12/22/20 1007  NA 139 137 137   < > 139 135 137  K 4.0 3.6 4.5   < > 4.1 4.0 4.1  CL 105 100 98   < > 100 99 99  CO2 $Re'25 26 27   'DJP$ < > $R'27 27 29  'XG$ GLUCOSE 101* 129* 134*   < > 47* 108* 87  BUN 23 21 25*   < > 21 22 28*  CREATININE 1.52* 1.50* 1.64*   < > 1.17 0.98 1.08  CALCIUM 9.2 8.9 9.3   < > 9.4 9.1 9.5  GFRNONAA 47* 48* 43*   < > >60 >60 >60  GFRAA 55* 56* 50*  --   --   --   --   PROT 7.4 7.0 7.3   < > 7.0 6.8 7.0  ALBUMIN 3.5 3.5 3.5   < > 3.4* 3.7 3.6  AST $Re'19 18 20   'div$ < > $R'15 16 17  'jV$ ALT $'16 16 17   'a$ < > $R'18 18 19  'td$ ALKPHOS 82 93 103   < > 61 69 63  BILITOT 0.7 0.8 0.6   < > 0.7 0.6 0.2*   < > = values in this interval not displayed.  RADIOGRAPHIC STUDIES: I have personally reviewed the radiological images as listed and agreed with the findings in the report. IR GASTROSTOMY TUBE MOD SED  Result Date: 09/24/2020 INDICATION: History of head neck cancer. In need of percutaneous gastrostomy tube placement for enteric nutrition supplementation purposes. EXAM: PULL TROUGH GASTROSTOMY TUBE PLACEMENT COMPARISON:  CT abdomen and pelvis-09/16/2020; neck CT-09/07/2020 MEDICATIONS: Ancef 2 gm IV; Antibiotics were administered within 1 hour of the procedure. Glucagon 1 mg IV CONTRAST:  20 cc Visipaque 320 administered into the gastric lumen. ANESTHESIA/SEDATION: Moderate (conscious) sedation was employed during this procedure. A total of Versed 4 mg and Fentanyl 100 mcg was administered intravenously. Moderate Sedation Time: 22 minutes. The patient's level of consciousness and vital signs were monitored continuously by radiology nursing throughout the procedure under my direct supervision. FLUOROSCOPY TIME:  7 minutes, 48 seconds (54.5 mGy) COMPLICATIONS: None immediate. PROCEDURE: Informed  written consent was obtained from the patient following explanation of the procedure, risks, benefits and alternatives. A time out was performed prior to the initiation of the procedure. Ultrasound scanning was performed to demarcate the edge of the left lobe of the liver. Maximal barrier sterile technique utilized including caps, mask, sterile gowns, sterile gloves, large sterile drape, hand hygiene and Betadine prep. The left upper quadrant was sterilely prepped and draped. An oral gastric catheter was inserted into the stomach under fluoroscopy. The existing nasogastric feeding tube was removed. The left costal margin and air opacified transverse colon were identified and avoided. Air was injected into the stomach for insufflation and visualization under fluoroscopy. Under sterile conditions a 17 gauge trocar needle was utilized to access the stomach percutaneously beneath the left subcostal margin after the overlying soft tissues were anesthetized with 1% Lidocaine with epinephrine. Needle position was confirmed within the stomach with aspiration of air and injection of small amount of contrast. A single T tack was deployed for gastropexy. Over an Amplatz guide wire, a 9-French sheath was inserted into the stomach. A snare device was utilized to capture the oral gastric catheter. The snare device was pulled retrograde from the stomach up the esophagus and out the oropharynx. The 20-French pull-through gastrostomy was connected to the snare device and pulled antegrade through the oropharynx down  the esophagus into the stomach and then through the percutaneous tract external to the patient. The gastrostomy was assembled externally. Contrast injection confirms position in the stomach. Several spot radiographic images were obtained in various obliquities for documentation. The patient tolerated procedure well without immediate post procedural complication. FINDINGS: After successful fluoroscopic guided placement,  the gastrostomy tube is appropriately positioned with internal disc against the ventral aspect of the gastric lumen. IMPRESSION: Successful fluoroscopic insertion of a 20-French pull-through gastrostomy tube. The gastrostomy may be used immediately for medication administration and in 24 hrs for the initiation of feeds. Electronically Signed   By: Simonne Come M.D.   On: 09/24/2020 11:21   NM PET Image Restag (PS) Skull Base To Thigh  Result Date: 11/10/2020 CLINICAL DATA:  Subsequent treatment strategy for oropharyngeal squamous cell carcinoma, most recent radiation therapy 2 weeks prior. EXAM: NUCLEAR MEDICINE PET SKULL BASE TO THIGH TECHNIQUE: 9.4 mCi F-18 FDG was injected intravenously. Full-ring PET imaging was performed from the skull base to thigh after the radiotracer. CT data was obtained and used for attenuation correction and anatomic localization. Fasting blood glucose: 78 mg/dl COMPARISON:  47/31/9243 PET-CT. Outside CT neck and chest from 09/02/2020. FINDINGS: Mediastinal blood pool activity: SUV max 2.3 Liver activity: SUV max NA NECK: Right thoracic inlet hypermetabolic 3.9 x 2.6 cm soft tissue mass located posterior to the right thyroid lobe and to the right of esophagus with max SUV 12.0 (series 3/image 67), decreased from 5.2 x 3.5 cm on 09/02/2020 outside neck CT, increased in size from 2.1 x 1.8 cm and similar in uptake with max SUV 12.9 on 04/26/2020 PET-CT. No additional hypermetabolic neck lymph nodes. Incidental CT findings: Right internal jugular Port-A-Cath terminates at the cavoatrial junction. CHEST: Right paratracheal hypermetabolic 1.2 cm lymph node with max SUV 4.0 (series 3/image 81), decreased in size from 1.7 cm on 09/02/2020 outside chest CT, increased in size from 1.0 cm and decreased in uptake with max SUV 6.2 on 04/26/2020 PET-CT. Hypermetabolic right hilar lymph node with max SUV 5.2, previous max SUV 8.3 on 04/26/2020 PET-CT, decreased. No new enlarged or hypermetabolic  axillary, mediastinal or hilar lymph nodes. Mildly hypermetabolic solid 1.2 cm right upper lobe pulmonary nodule with max SUV 3.0 (series 3/image 112), previously 1.2 cm with max SUV 4.2, stable in size and mildly decreased in metabolism. Low level nonfocal hypermetabolism associated with new patchy ground-glass opacity in the peripheral right upper lobe (series 3/image 102), presumably inflammatory. No additional hypermetabolic pulmonary findings. Incidental CT findings: Coronary atherosclerosis. Atherosclerotic nonaneurysmal thoracic aorta. Mild centrilobular emphysema. ABDOMEN/PELVIS: No abnormal hypermetabolic activity within the liver, pancreas, adrenal glands, or spleen. No hypermetabolic lymph nodes in the abdomen or pelvis. Incidental CT findings: Simple 1.8 cm left liver cyst. Atherosclerotic nonaneurysmal abdominal aorta. Simple 5.1 cm medial upper right renal cyst. Well-positioned percutaneous gastrostomy tube terminating in the body of the stomach. Mild diffuse colonic diverticulosis. SKELETON: No focal hypermetabolic activity to suggest skeletal metastasis. Incidental CT findings: none IMPRESSION: 1. Hypermetabolic soft tissue metastasis at the right thoracic inlet, decreased in size since outside 09/02/2020 neck CT, increased in size and similar in uptake compared to 04/26/2020 PET-CT. 2. Hypermetabolic right paratracheal and right hilar nodal metastases, decreased in size from outside 09/02/2020 chest CT, decreased in FDG uptake since 04/26/2020 PET-CT. 3. Mildly hypermetabolic right upper lobe solid pulmonary nodule, stable in size and mildly decreased in metabolism since 04/26/2020 PET-CT. 4. No new or progressive hypermetabolic metastatic disease. 5. Chronic findings include: Aortic Atherosclerosis (ICD10-I70.0)  and Emphysema (ICD10-J43.9). Coronary atherosclerosis. Mild diffuse colonic diverticulosis. Electronically Signed   By: Ilona Sorrel M.D.   On: 11/10/2020 10:20     ASSESSMENT & PLAN:    1. 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[place])   2. Dysphagia, unspecified type   3. Chemotherapy-induced neuropathy ([place])   4. Normocytic anemia   5. Encounter for antineoplastic chemotherapy   6. Port-A-Cath in place    #Metastatic squamous cell carcinoma of oropharynx- cervical lymphadenopathy, lung metastatic disease, local invasion of esophagus. Patient finished palliative radiation to cervical lymphadenopathy/peri-esophageal mass. Recommend to obtain a new baseline with CT neck/chest. Recommend patient to start Piqray 300 mg daily next week-off label use.  Discussed with his [facility] oncologist Dr. Barrington Ellison.  Rationale and potential side effects were discussed with patient and he agrees with the plan. Labs are reviewed and discussed with patient  # Dysphagia, s/p PEG tube placement.  Symptoms have improved.  Currently slightly worse likely due to radiation esophagitis. Continue tube feeding.   # Neoplasm associated pain, no pain currently. Symptoms are stable. #Port-A-Cath in place, continue port flush every 8 weeks.  Follow-up: CT in 1 to 2 weeks, follow-up in the clinic in 2 weeks. Earlie Server, MD, PhD Hematology Oncology [place] 12/22/2020

## 2020-12-22 NOTE — Progress Notes (Signed)
Patient here for follow up. No new concerns voiced.  °

## 2020-12-30 ENCOUNTER — Telehealth: Payer: Self-pay | Admitting: Pharmacist

## 2020-12-30 DIAGNOSIS — C109 Malignant neoplasm of oropharynx, unspecified: Secondary | ICD-10-CM

## 2020-12-31 ENCOUNTER — Telehealth: Payer: Self-pay

## 2020-12-31 NOTE — Telephone Encounter (Signed)
-----   Message from Sheliah Hatch, CPhT sent at 12/30/2020  3:39 PM EST ----- Patients application for assistance is still being processed.  He can continue to get Piqray from [facility] until a decision is made.    ----- Message ----- From: Earlie Server, MD Sent: 12/30/2020   2:42 PM EST To: Sheliah Hatch, CPhT, Evelina Dun, RN, #  Patient is started on [place], he has one month supply prescribed from [facility]. His [facility] Oncologist prefers me to take care of the refills as she is leaving [facility].  He has one month supply and just started. Please let me know what need to be done for future refills.

## 2021-01-05 ENCOUNTER — Ambulatory Visit
Admission: RE | Admit: 2021-01-05 | Discharge: 2021-01-05 | Disposition: A | Discharge status: Home / Self Care | Payer: Medicare Other | Source: Ambulatory Visit | Attending: Oncology | Admitting: Oncology

## 2021-01-05 ENCOUNTER — Other Ambulatory Visit: Payer: Self-pay

## 2021-01-05 DIAGNOSIS — C109 Malignant neoplasm of oropharynx, unspecified: Secondary | ICD-10-CM

## 2021-01-05 IMAGING — CT CT CHEST W/ CM
2 of 4 series · 15 of 36 positions shown, 18 images · IV contrast (omnipaque)
Comparison: PET-CT [DATE] and CT scan [DATE]

CLINICAL DATA: Restaging squamous cell carcinoma the oropharynx and
chest.

EXAM:
CT CHEST WITH CONTRAST
TECHNIQUE: Multidetector CT imaging of the chest was performed during
intravenous contrast administration.
CONTRAST:  75mL OMNIPAQUE IOHEXOL 300 MG/ML  SOLN

[Series 6: lungs cap with 2.00 ax · axial · 0.68mm/px · z∈[-944,-650]mm · 12 of 165 slices shown, 15 images]
[im 9/165  mediastinal]
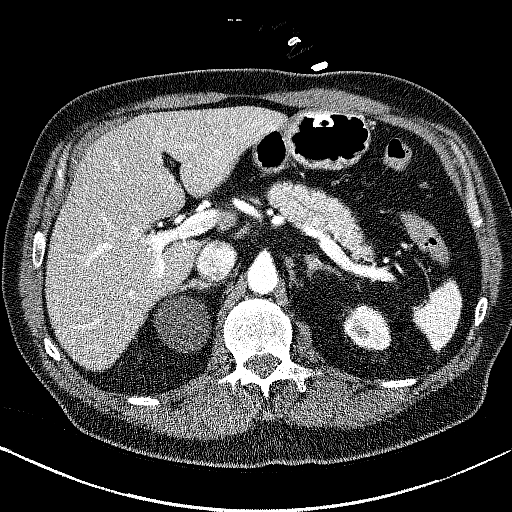
[im 9/165  lung]
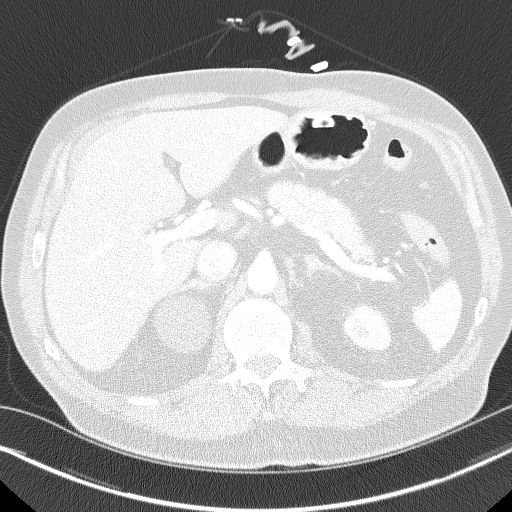
[im 26/165  lung]
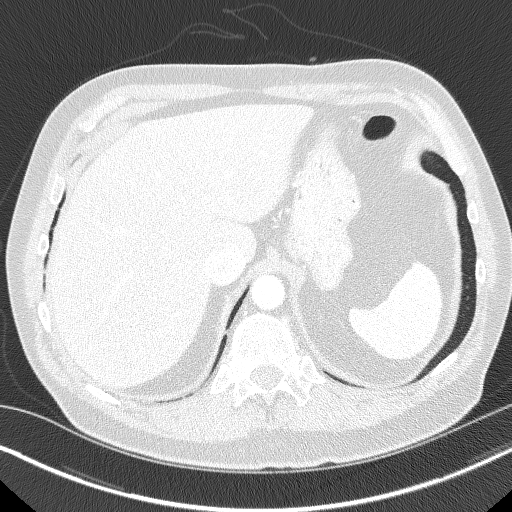
[im 35/165  lung]
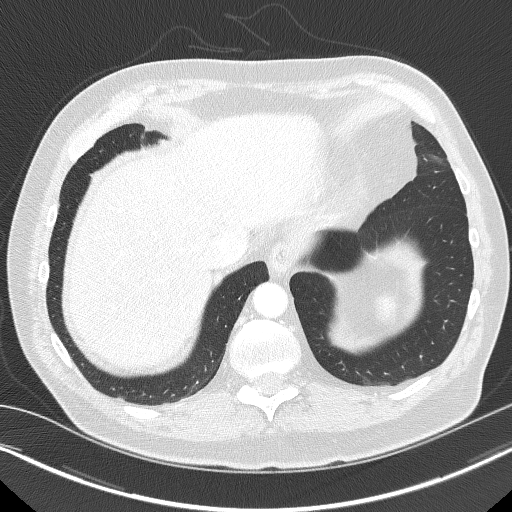
[im 52/165  lung]
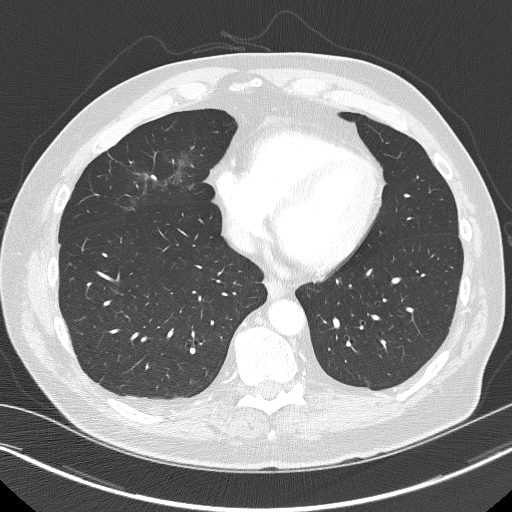
[im 61/165  mediastinal]
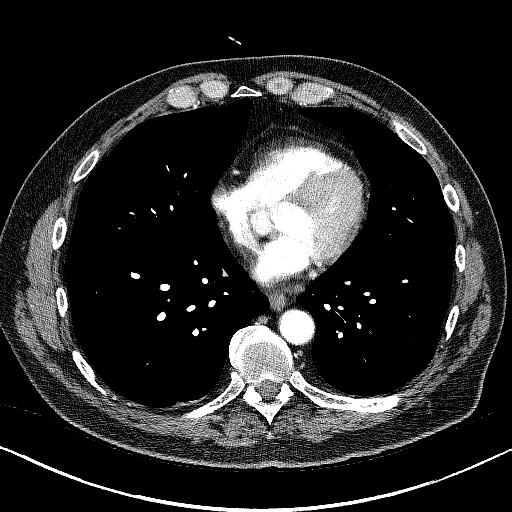
[im 61/165  lung]
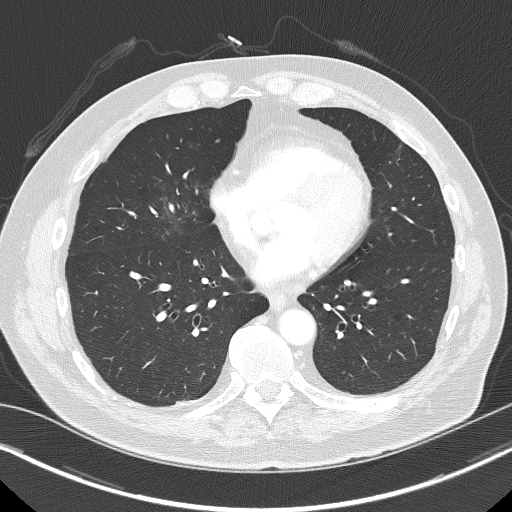
[im 78/165  lung]
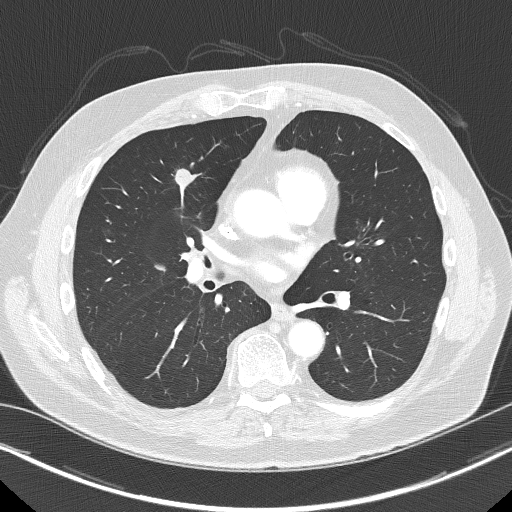
[im 87/165  lung]
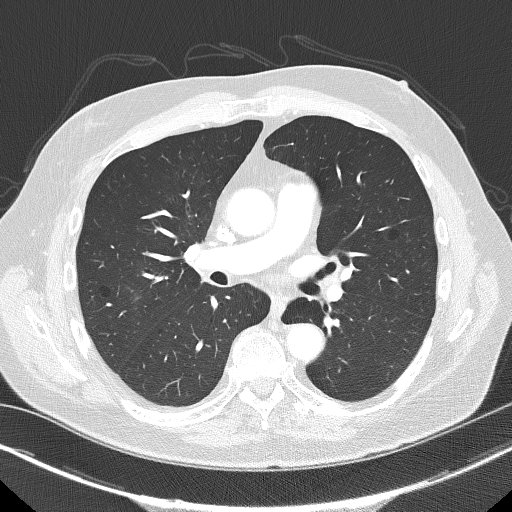
[im 104/165  lung]
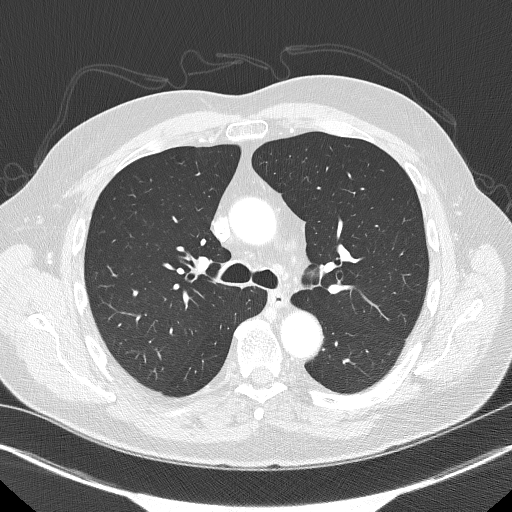
[im 113/165  mediastinal]
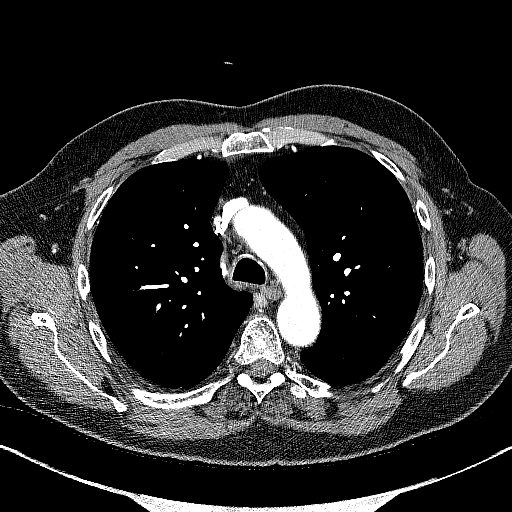
[im 113/165  lung]
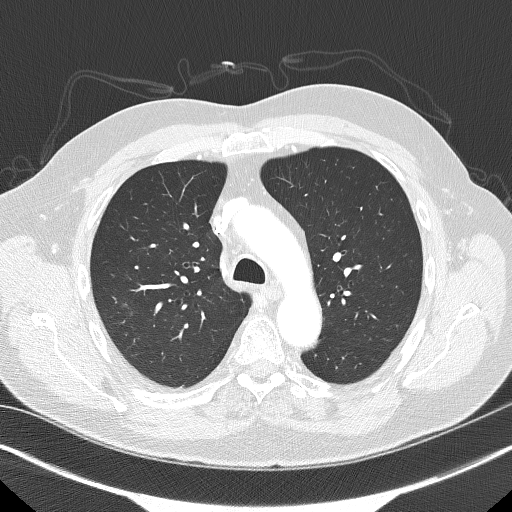
[im 130/165  lung]
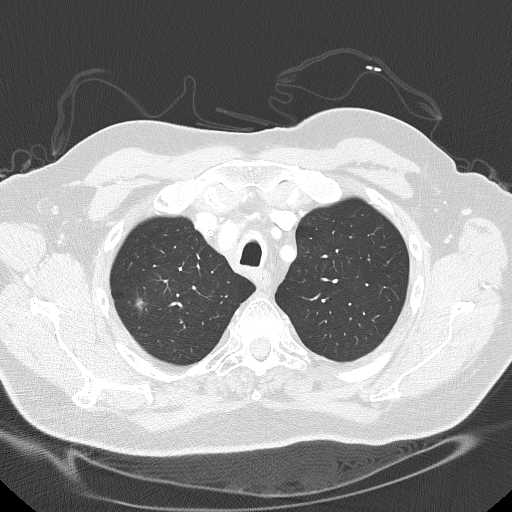
[im 139/165  lung]
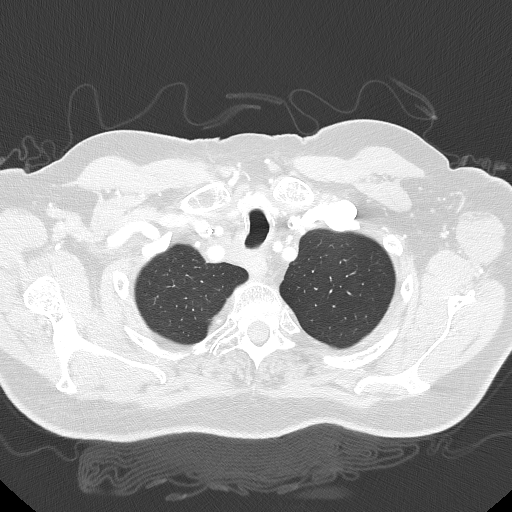
[im 156/165  lung]
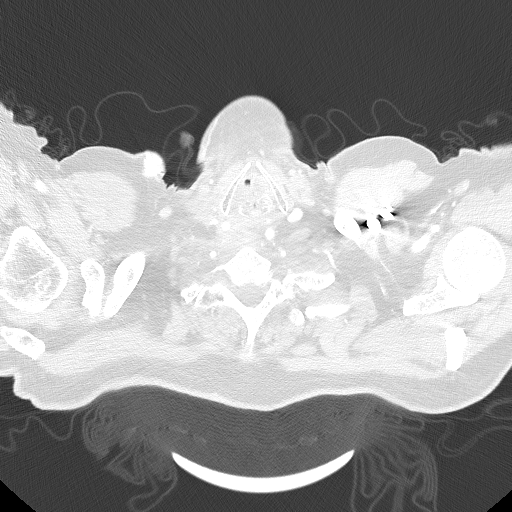

[Series 8: coronal cap with 2.00 cor · coronal · 0.65mm/px · 3 of 148 slices shown]
[im 30/148  lung]
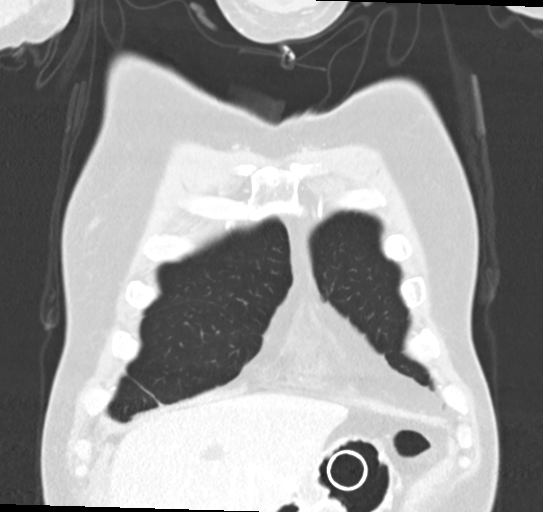
[im 59/148  lung]
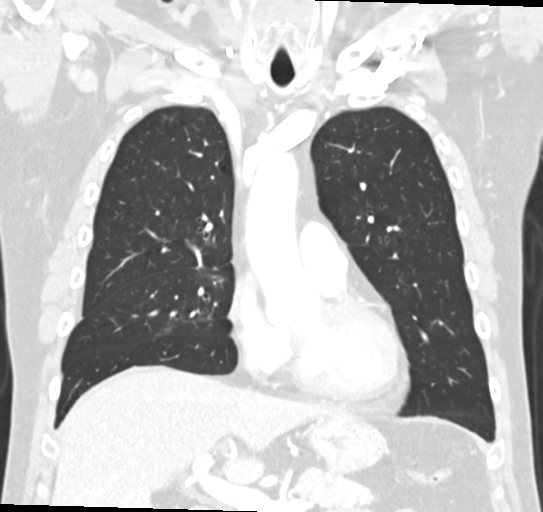
[im 89/148  lung]
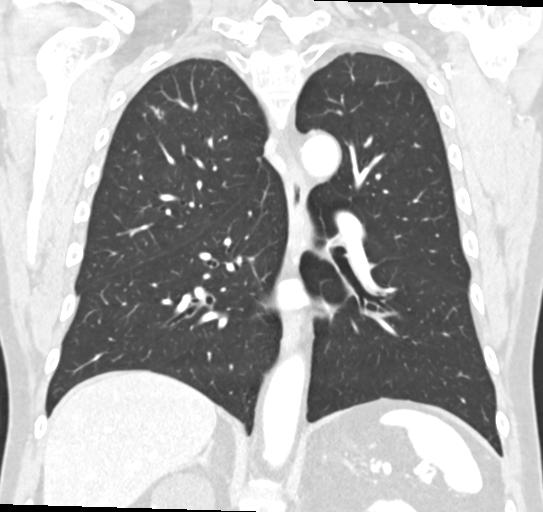

[15 of 36 positions shown; findings below may reference images not displayed]

FINDINGS: Cardiovascular: The heart is normal in size. No pericardial
effusion. The aorta is normal in caliber. No dissection. Scattered
atherosclerotic calcifications. Stable coronary artery
calcifications. The pulmonary arteries are unremarkable.

Mediastinum/Nodes: Persistent soft tissue density the involving the
thoracic inlet region posterior to the cricoid cartilage and
extending down into the upper mediastinum. This measures a maximum
3.7 x 2.0 cm. On the prior CT scan it measured 5.5 x 4.0 cm and now
appears less solid.

When compared to the prior CT scan from [DATE] the right
paratracheal nodal mass has significantly decreased in size. It
measures 6.5 mm on image number [DATE] and previously measured 17 mm.

Right hilar node on image number 29/for measures 11 mm and
previously measured 17 mm.

Precarinal lymph node on image number [DATE] measures 5 mm and
previously measured 8 mm.

No new or progressive findings.

The esophagus is grossly normal.

Lungs/Pleura: 10.5 x 10.5 mm right upper lobe pulmonary nodule on
image number 88/6 previously measured 15 x 14 mm.

Other areas of scattered ground-glass opacity in the right lung are
new and likely inflammatory. Some of these were present on the prior
PET-CT from [facility].

Mild bronchiectasis and patchy ground-glass opacity in the right
middle lobe appears slightly improved when compared to the prior
PET-CT.

No pleural effusions or pleural nodules.

Upper Abdomen: No significant upper abdominal findings. Stable left
hepatic lobe cyst. A feeding gastrostomy tube is noted. No adrenal
gland lesions.

Musculoskeletal: No significant bony findings.
IMPRESSION: 1. Interval regression of thoracic inlet lesion, mediastinal and
right hilar adenopathy and right upper lobe pulmonary nodule. No new
or progressive findings.
2. Patchy ground-glass opacities in the right lung are likely
inflammatory. 8 mm sub solid right upper lobe nodule will require
surveillance.
3. No findings for upper abdominal metastatic disease or osseous
metastatic disease.

Aortic Atherosclerosis ([NH]-[NH]) and Emphysema ([NH]-[NH]).

## 2021-01-05 IMAGING — CT CT NECK W/ CM
4 of 5 series · 14 of 33 positions shown, 16 images · IV contrast (omnipaque)
Comparison: PET-CT [DATE]

CLINICAL DATA: Squamous cell carcinoma of oropharynx, multiple
recurrences and treatments, follow-up

EXAM:
CT NECK WITH CONTRAST
TECHNIQUE: Multidetector CT imaging of the neck was performed using the
standard protocol following the bolus administration of intravenous
contrast.
CONTRAST:  75mL OMNIPAQUE IOHEXOL 300 MG/ML  SOLN

[Series 3: axials neck 2.00 · axial · 0.56mm/px · z∈[-668,-580]mm · 2 of 134 slices shown]
[im 45/134  bone]
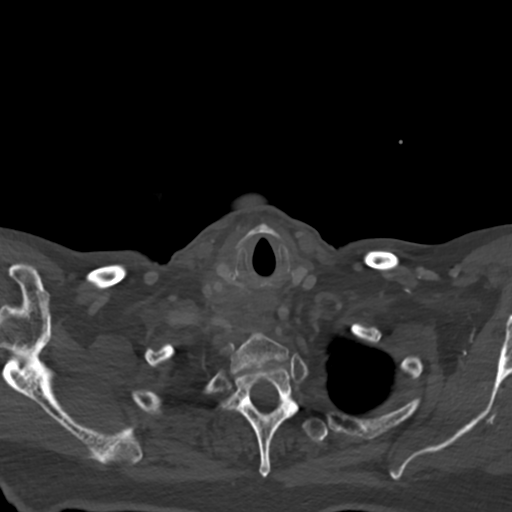
[im 89/134  bone]
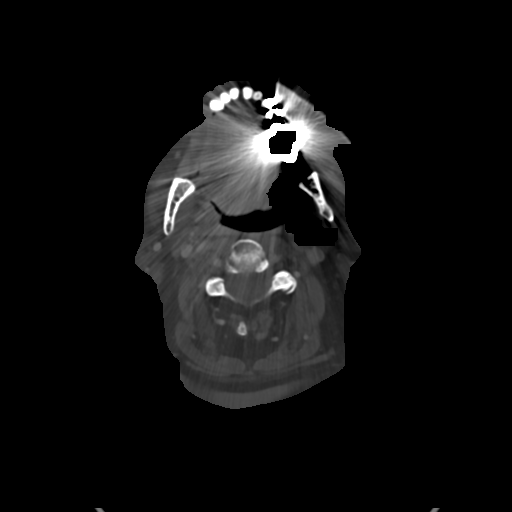

[Series 5: coronals neck 2.00 cor · coronal · 0.56mm/px · 3 of 137 slices shown]
[im 51/137  bone]
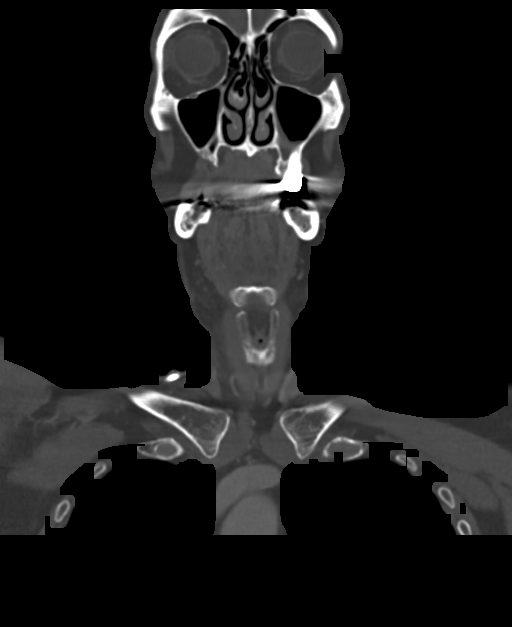
[im 63/137  bone]
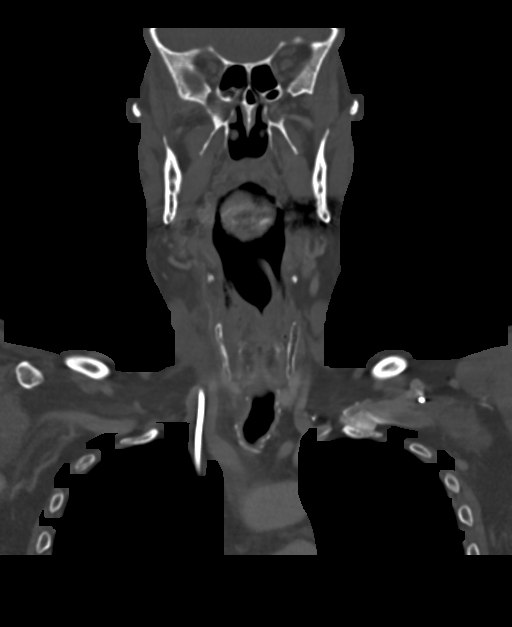
[im 75/137  bone]
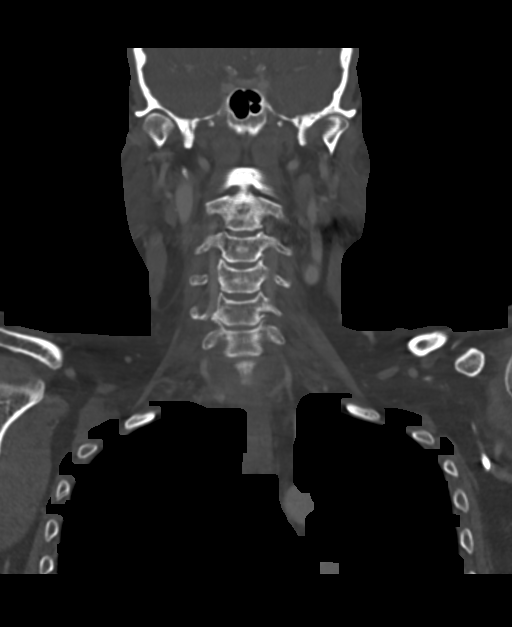

[Series 7: sagittals neck 2.00 sag · sagittal · 0.54mm/px · 5 of 144 slices shown, 6 images]
[im 48/144  bone]
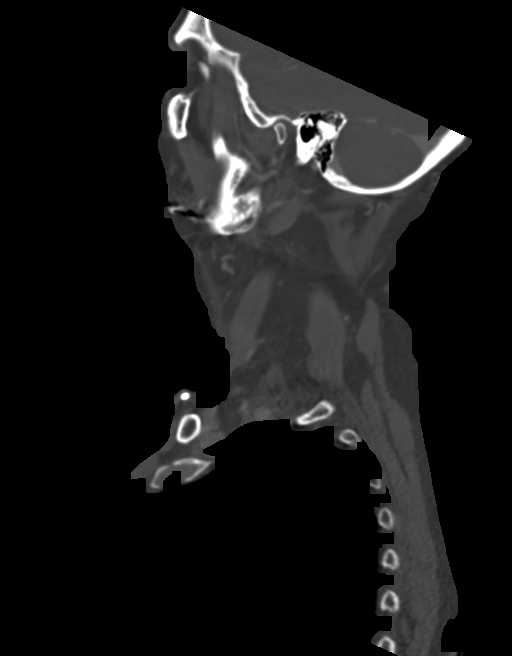
[im 60/144  bone]
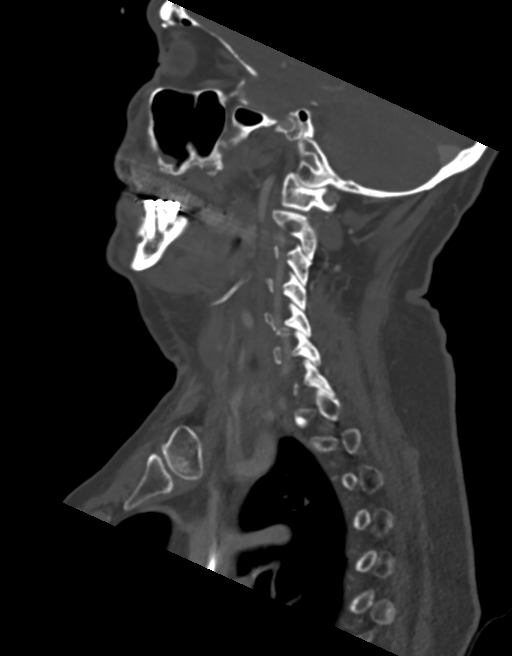
[im 72/144  soft-tissue]
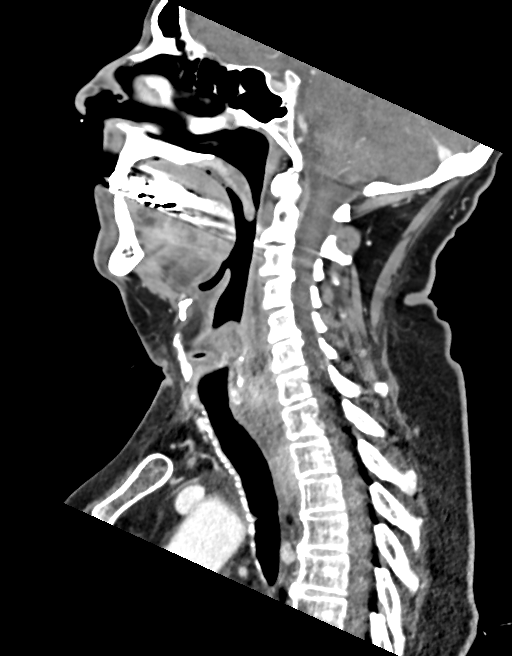
[im 72/144  bone]
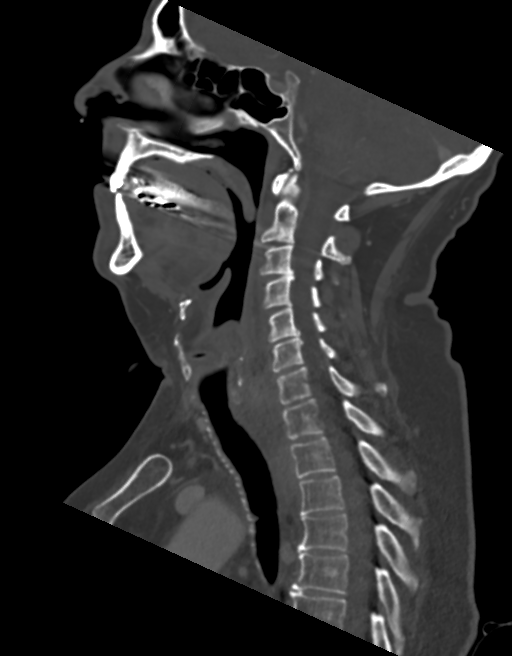
[im 84/144  bone]
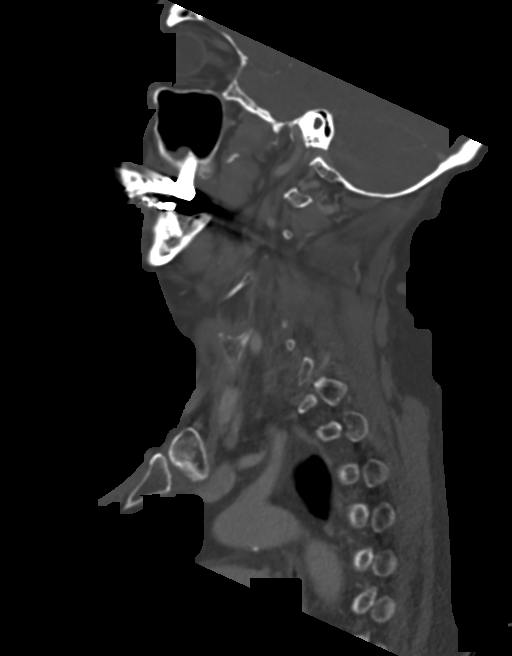
[im 96/144  bone]
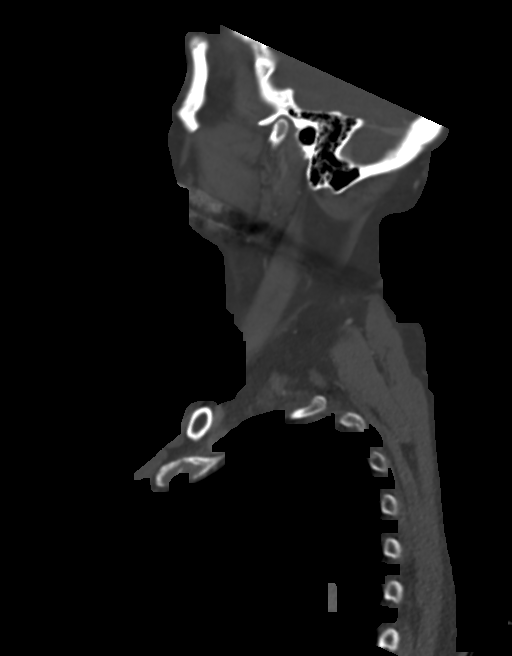

[Series 9: ax oropharynx neck 2.00 ax · axial · 0.54mm/px · z∈[-781,-590]mm · 4 of 176 slices shown, 5 images]
[im 36/176  soft-tissue]
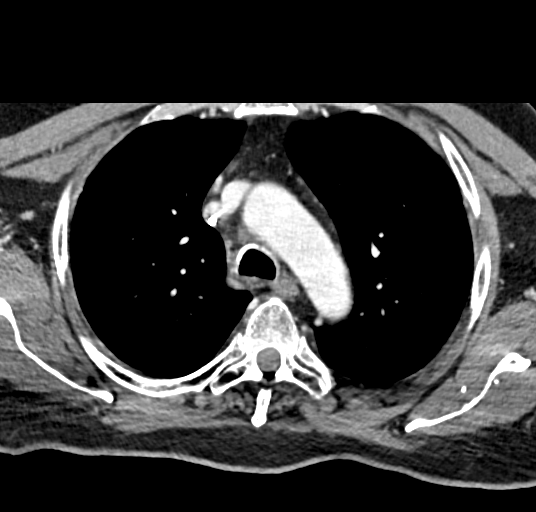
[im 36/176  bone]
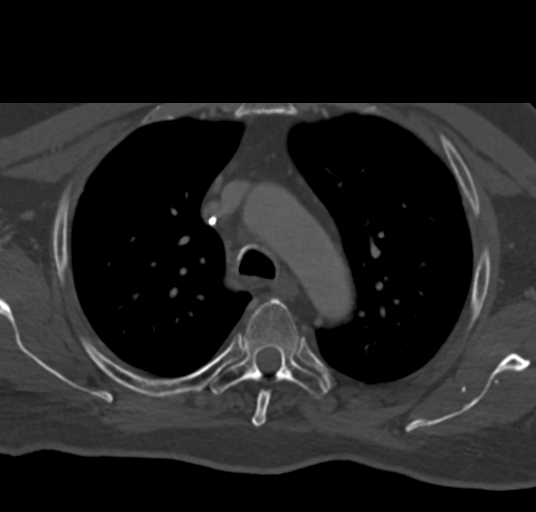
[im 71/176  bone]
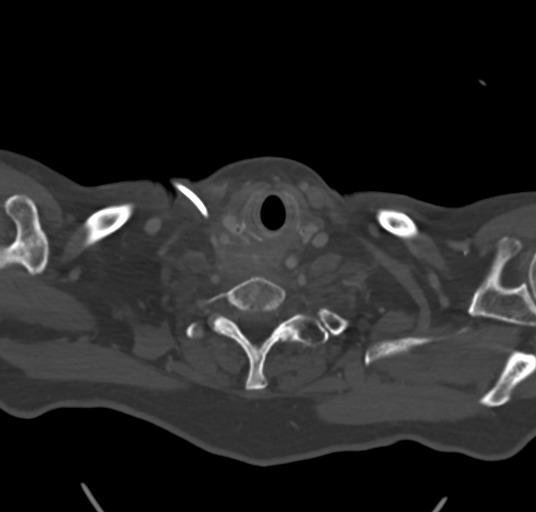
[im 106/176  bone]
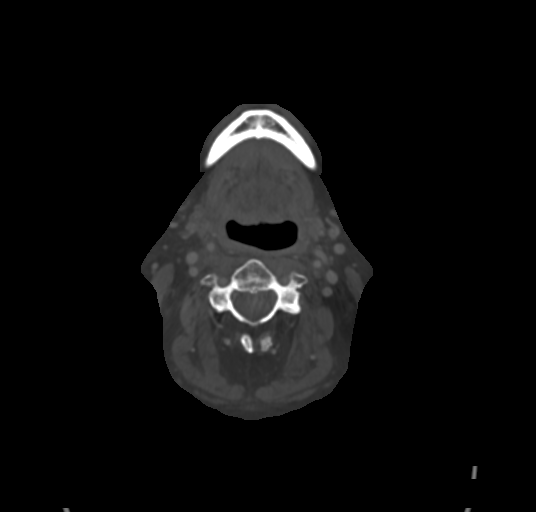
[im 141/176  bone]
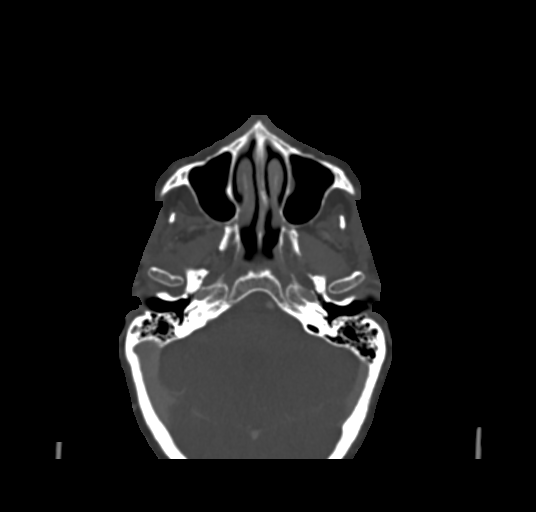

[14 of 33 positions shown; findings below may reference images not displayed]

FINDINGS: Pharynx and larynx: Post radiation changes are present.
Medialization of the right vocal cord. No evidence of local
recurrent disease.

Salivary glands: Post radiation changes.  Otherwise unremarkable.

Thyroid: Unremarkable.

Lymph nodes: Confluent soft tissue is again identified near the
thoracic inlet along the right aspect of the esophagus and posterior
to the right lobe of the thyroid. Margins are poorly delineated and
therefore measurement is difficult, but there is no progression
since the [DATE] PET-CT. No new adenopathy identified.

Vascular: Major neck vessels are patent.

Limited intracranial: No abnormal enhancement.

Visualized orbits: Unremarkable.

Mastoids and visualized paranasal sinuses: Minor paranasal sinus
mucosal thickening. Mastoid air cells are clear.

Skeleton: Cervical spine degenerative changes.

Upper chest: Dictated separately.

Other: None.
IMPRESSION: No progression of right paraesophageal metastatic nodal conglomerate
since [DATE] PET-CT. Comparison is difficult and this lesion is
either stable or decreased in size.

No new adenopathy in the neck.

Stable post treatment appearance of the pharynx and larynx.

## 2021-01-05 MED ORDER — IOHEXOL 300 MG/ML  SOLN
75.0000 mL | Freq: Once | INTRAMUSCULAR | Status: AC | PRN
  Administered 2021-01-05: 75 mL via INTRAVENOUS

## 2021-01-05 NOTE — Telephone Encounter (Signed)
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  Labs from 12/23/2019 assessed, appropriate for initiation. Prescription dose and frequency assessed.   Current medication list in Epic reviewed, no DDIs with Piqray identified:  Prescription has been e-scribed to the [facility] for benefits analysis and approval.  Oral Oncology Clinic will continue to follow for insurance authorization, copayment issues, initial counseling and start date.  Will meet with patient 2/3 to follow up how he is tolerating Piqray so far and to discuss [facility] education including rash prevention with Zyrtec. Will also order a baseline HbA1c and coordinate for a fasting glucose level in ~2 weeks.   Eddie Candle, PharmD, BCPS PGY2 Hematology/Oncology Pharmacy Resident Oral Chemotherapy [facility] 01/05/2021 10:18 AM

## 2021-01-06 ENCOUNTER — Inpatient Hospital Stay (HOSPITAL_BASED_OUTPATIENT_CLINIC_OR_DEPARTMENT_OTHER): Payer: Medicare Other | Admitting: Oncology

## 2021-01-06 ENCOUNTER — Inpatient Hospital Stay: Payer: Medicare Other | Attending: Oncology

## 2021-01-06 ENCOUNTER — Telehealth: Payer: Self-pay

## 2021-01-06 ENCOUNTER — Encounter: Payer: Self-pay | Admitting: Oncology

## 2021-01-06 ENCOUNTER — Inpatient Hospital Stay: Payer: Medicare Other | Admitting: Pharmacist

## 2021-01-06 VITALS — BP 135/85 | HR 92 | Temp 97.2°F | Resp 16 | Wt 183.0 lb

## 2021-01-06 DIAGNOSIS — R739 Hyperglycemia, unspecified: Secondary | ICD-10-CM

## 2021-01-06 DIAGNOSIS — C109 Malignant neoplasm of oropharynx, unspecified: Secondary | ICD-10-CM | POA: Diagnosis present

## 2021-01-06 DIAGNOSIS — Z95828 Presence of other vascular implants and grafts: Secondary | ICD-10-CM | POA: Diagnosis not present

## 2021-01-06 DIAGNOSIS — Z79899 Other long term (current) drug therapy: Secondary | ICD-10-CM | POA: Insufficient documentation

## 2021-01-06 DIAGNOSIS — R918 Other nonspecific abnormal finding of lung field: Secondary | ICD-10-CM | POA: Insufficient documentation

## 2021-01-06 DIAGNOSIS — C778 Secondary and unspecified malignant neoplasm of lymph nodes of multiple regions: Secondary | ICD-10-CM | POA: Diagnosis not present

## 2021-01-06 DIAGNOSIS — Z8042 Family history of malignant neoplasm of prostate: Secondary | ICD-10-CM | POA: Insufficient documentation

## 2021-01-06 DIAGNOSIS — R1310 Dysphagia, unspecified: Secondary | ICD-10-CM | POA: Diagnosis not present

## 2021-01-06 DIAGNOSIS — Z8052 Family history of malignant neoplasm of bladder: Secondary | ICD-10-CM | POA: Insufficient documentation

## 2021-01-06 DIAGNOSIS — Z931 Gastrostomy status: Secondary | ICD-10-CM | POA: Diagnosis not present

## 2021-01-06 DIAGNOSIS — Z5111 Encounter for antineoplastic chemotherapy: Secondary | ICD-10-CM | POA: Diagnosis not present

## 2021-01-06 DIAGNOSIS — Z808 Family history of malignant neoplasm of other organs or systems: Secondary | ICD-10-CM | POA: Insufficient documentation

## 2021-01-06 DIAGNOSIS — Z803 Family history of malignant neoplasm of breast: Secondary | ICD-10-CM | POA: Diagnosis not present

## 2021-01-06 LAB — CBC WITH DIFFERENTIAL/PLATELET
Abs Immature Granulocytes: 0.03 10*3/uL (ref 0.00–0.07)
Basophils Absolute: 0.1 10*3/uL (ref 0.0–0.1)
Basophils Relative: 1 %
Eosinophils Absolute: 0.2 10*3/uL (ref 0.0–0.5)
Eosinophils Relative: 3 %
HCT: 46.7 % (ref 39.0–52.0)
Hemoglobin: 15.7 g/dL (ref 13.0–17.0)
Immature Granulocytes: 0 %
Lymphocytes Relative: 7 %
Lymphs Abs: 0.5 10*3/uL — ABNORMAL LOW (ref 0.7–4.0)
MCH: 30.1 pg (ref 26.0–34.0)
MCHC: 33.6 g/dL (ref 30.0–36.0)
MCV: 89.6 fL (ref 80.0–100.0)
Monocytes Absolute: 0.3 10*3/uL (ref 0.1–1.0)
Monocytes Relative: 5 %
Neutro Abs: 5.7 10*3/uL (ref 1.7–7.7)
Neutrophils Relative %: 84 %
Platelets: 299 10*3/uL (ref 150–400)
RBC: 5.21 MIL/uL (ref 4.22–5.81)
RDW: 14.4 % (ref 11.5–15.5)
WBC: 6.8 10*3/uL (ref 4.0–10.5)
nRBC: 0 % (ref 0.0–0.2)

## 2021-01-06 LAB — COMPREHENSIVE METABOLIC PANEL
ALT: 19 U/L (ref 0–44)
AST: 17 U/L (ref 15–41)
Albumin: 3.7 g/dL (ref 3.5–5.0)
Alkaline Phosphatase: 67 U/L (ref 38–126)
Anion gap: 9 (ref 5–15)
BUN: 26 mg/dL — ABNORMAL HIGH (ref 8–23)
CO2: 28 mmol/L (ref 22–32)
Calcium: 9.1 mg/dL (ref 8.9–10.3)
Chloride: 99 mmol/L (ref 98–111)
Creatinine, Ser: 1.22 mg/dL (ref 0.61–1.24)
GFR, Estimated: >60 mL/min (ref >60)
Glucose, Bld: 202 mg/dL — ABNORMAL HIGH (ref 70–99)
Potassium: 4.7 mmol/L (ref 3.5–5.1)
Sodium: 136 mmol/L (ref 135–145)
Total Bilirubin: 0.5 mg/dL (ref 0.3–1.2)
Total Protein: 7.3 g/dL (ref 6.5–8.1)

## 2021-01-06 LAB — HEMOGLOBIN A1C
Hgb A1c MFr Bld: 6.1 % — ABNORMAL HIGH (ref 4.8–5.6)
Mean Plasma Glucose: 128.37 mg/dL

## 2021-01-06 NOTE — Telephone Encounter (Signed)
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  S/p PEG placement on 10/22.  Patient has completed radiation and started piqray.    Spoke with patient via phone for follow-up.  Patient reports tolerating mostly 5 cartons of [employer] 1.4 daily via feeding tube.  Flushing with at least 92ml of water before and 111ml after.  Taking extra water orally, sips and via tube to stay hydrated. Reports regular bowel movement typically in am and sometimes in pm as well.  No issues with tolerating feeding.  Has enteral supplies from [employer].  Patient is eating some orally but not much.  Relying on tube feeding at this time for majority of nutrition.   Medications: reviewed  Labs: reviewed  Anthropometrics:   Weight 182 lb on 1/18 increased from 179 lb on 12/13   Estimated Energy Needs  Kcals: 2100-2500 Protein: 105-125 g Fluid: > 2.1 L  NUTRITION DIAGNOSIS: Inadequate oral intake but relying on feeding tube   INTERVENTION:  Patient to continue [employer] 1.4, 5 cartons per day (1 carton 5 times per day). Flush with at least 13ml of water before and 159ml after.  Can drink or give via tube additional 1-2 cups water daily for better hydration.   Tube feeding provides 2275 calories, 100 g protein and 2310-2524ml free water (including free water in formula). Patient has contact information    MONITORING, EVALUATION, GOAL: weight trends, intake, tube feeding   NEXT VISIT: 4-6 weeks phone call  Jamise Pentland [name], [place], [place] Registered Dietitian [phone] (mobile)

## 2021-01-06 NOTE — Progress Notes (Signed)
Nutrition  Pharmacy team asked RD to explore options for diabetic friendly formula as patient with elevated blood glucose on piqray.  Diabetic friendly formulas are available but challenges with insurance coverage are present.  Patient is currently not paying anything out of pocket for enteral formula and supplies.     MD and pharmacy team informed. Will continue to follow.  Ivan Garcia [name], [place], [place] Registered Dietitian [phone] (mobile)

## 2021-01-06 NOTE — Progress Notes (Signed)
Patient denies new problems/concerns today.   °

## 2021-01-06 NOTE — Progress Notes (Signed)
Hematology/Oncology  Follow Up note [facility] [phone] [phone]) [phone]   Patient Care Team: Albina Billet, MD as PCP - General (Internal Medicine) Albina Billet, MD (Internal Medicine) Bary Castilla, [place] Gleason, MD (General Surgery) Noreene Filbert, MD as Referring Physician (Radiation Oncology) Earlie Server, MD as Consulting Physician (Oncology)  CHIEF COMPLAINTS/PURPOSE OF CONSULTATION:  Follow up for head and neck cancer.  HISTORY OF PRESENTING ILLNESS:  Ivan Blankley. is a  65 y.o.  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 P16 positive. cT2 cN2 disease, stage II, # Concurrent ChemoRT  Cisplatin 100 mg/m2 q3weeks with concurrent RT which started on 12/31/2017.  S/p one dose of Cisplatin. Cisplatin discontinued due to nephrotoxicity. Switch to weekly Carboplatin (AUC 2) / Taxol 58m/m2 [ finished May 2019] Patient has finished concurrent chemoradiation in May 2019  # He reports doing well until in July 2020, he started to have hoarseness after singing for 2 to 3 hours. He has changed his insurance and Dr. MTami Ribasis no longer covered by his current insurance.  So he establish care with Dr.Journii Nierman [place]. Flexible laryngoscopy showed Vocal cord paralysis.  Patient was recommended to have a PET scan done for restaging. Patient's primary care provider Dr. THall Busingordered a PET scan for patient which was done on 08/20/2019. 08/20/2019 PET scan showed asymmetric hypermetabolic him involving the left vocal cords with hypermetabolic adenopathy in the neck, and the chest.  He also has hypermetabolic pulmonary nodule along the minor fissure which is felt to be metastatic. Tiny proximal left ureteral stone without hydronephrosis.  Possible gallbladder sludge.  Enlarged prostate.   Atherosclerosis.  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  # 09/08/2019 Omniseq NGS showed PD-L1 CPS 20, TMB indeterminant, MSI stable,PIK3CA E542K, E545K  Started on chemotherapy with 6 cycles of carboplatin, 5-FU and Keytruda, finished in February 2021.  And has been on Keytruda maintenance. Initially he had a good response, however progressed whiled on Keytruda maintenance.  PET 04/26/2020 worsening of disease in the chest.  Interval enlargement of the right thoracic inlet and paratracheal lymph nodes that were previously enlarged with new activity in the right hilum corresponding to a lymph node in this location.  Also increased activity in the right upper lobe nodule. Right level 3 lymph node in the neck and anterior mediastinal nodal activity is diminished.  These areas are quite small on prior study. Diminished symmetric uptake within the left as compared to the right vocal cord. Right mandible increased activity in the area of third molar.  This is consistent with his recent tooth infection.  # [facility] [place] [facility].  Patient was offered for clinical trial with pembrolizumab/lenvatinib versus standard therapy. Patient received additional 1 cycle of Keytruda while waiting to be enrolled to the trial.   #Disease progression.  [facility] right upper paratracheal node is concerning for metastatic disease and a new from 01/20/2020 PET/CT.  The node closely abuts and possibly invade the posterolateral right tracheal wall as well as the esophagus.  Right vocal cord paralysis.  Symmetric soft tissue density at the right base of tongue could reflect residual/recurrent primary  malignancy versus posttreatment changes.  Right supraclavicular lymph node is no longer appreciated  with post treatment changes.  There is increased size of right minor fissure nodule which was metabolically avid on previous PET scan.  New mediastinal and hilar lymphadenopathy concerning for new nodal disease.  No evidence of metastatic disease below the diaphragm.  Nonobstructing 3 mm distal ureter stone.  07/07/2020 started on palliative chemotherapy with docetaxel 09/02/2020 3 cycles of docetaxel  with G-CSF support.    CT scan done at [facility] which unfortunately showed progression of disease. Images are not available for me to review.  CT was compared to June 24, 2020 CT done at [facility]. 09/02/2020,chest without contrast with 3D MIPS protocol showed Interval increase in the size of the right upper lobe perifissural nodule 1.6 x 1.5 cm-previously 1.4 x 1.2 cm tracheoesophageal soft tissue mass 3.4 x 5.0 cm with regional mass-effect on the esophagus-previously 2.4 x 3.6 cm, and mediastinal lymphadenopathy-right upper paratracheal lymph node 1.9 cm-previously 1.5 cm, suspicious for progressive metastatic disease. Subcentimeter lung nodules are stable. CT neck with contrast showed slight interval increase in size of upper right paratracheal nodal conglomerate.  Questionable soft tissue invasion of the posterior tracheal appears worsened.  Compression with potential invasion of the esophagus is again noted.  Prior vocal cord paralysis again noted.  Unchanged right tongue base asymmetry. I had a discussion with patient's [facility] oncologist Dr. Barrington Ellison over the phone. Dr.Choe recommends adding carboplatin AUC 5 to docetaxel regimen for now which hopefully to help to reduce the size of the tracheoesophageal soft tissue mass.  Also recommend palliative radiation. 09/07/2020, carboplatin AUC 5+ docetaxel S/p PEG tube placement on 09/23/2020 #09/28/2020, started on palliative radiation to esophageal  #Patient establish care with [facility] Dr. Maxie Better   09/29/2020  guardant 360 which showed PIK3CA E545K, PIK3CA  amplification. MSI stable, APC [id number] VUS , APC [id number], FGFR2 R573 VUS   . 10/25/20 patient finished palliative radiation to esophageal mass. 11/09/2020, patient had a PET scan which showed partial response Right thoracic inlet Hypermetabolic soft tissue metastasis, 3.9 x 2.6cm, decreased in size, decreased size of  Hypermetabolic right paratracheal 1.2cm, and right hilar nodal Metastases since outside 09/02/2020 neck CT Right upper lobe lung nodule 1.2cm, stable size.  Dr. Baruch Gouty recommend additional radiation. 11/23/20-12/17/19 patient finished additional radiation  INTERVAL HISTORY Ivan Desaulniers Abem Shaddix. is a 65 y.o. male who presents for follow-up for metastatic squamous cell carcinoma, P 16+.   He has started on Piqray $RemoveB'300mg'WeYiBWuo$  daily. Tolerates well. No rash.  Dysphagia, stable. He is on PEG tube feeding, gained 1 pounds since last visit.  He is able to take his medications orally. No fever, chills, shortness of breath.  Review of Systems  Constitutional: Negative for chills, fever, malaise/fatigue and weight loss.  HENT: Negative for sore throat.        Odynophagia  Eyes: Negative for redness.  Respiratory: Negative for cough, shortness of breath and wheezing.   Cardiovascular: Negative for chest pain, palpitations and leg swelling.  Gastrointestinal: Negative for abdominal pain, blood in stool, nausea and vomiting.       PEG tube placement  Genitourinary: Negative for dysuria.  Musculoskeletal: Negative for myalgias.  Skin: Negative for rash.  Neurological: Negative for dizziness, tingling and tremors.  Endo/Heme/Allergies: Does not bruise/bleed easily.  Psychiatric/Behavioral: Negative for hallucinations.    MEDICAL HISTORY:  Past Medical History:  Diagnosis Date  . Arthritis   . Cancer ([place])    Head and neck cancer  .  Hemorrhoids   . Hyperlipidemia   . Hypertension     SURGICAL HISTORY: Past Surgical History:  Procedure Laterality Date  . COLONOSCOPY WITH PROPOFOL  N/A 04/04/2017   Procedure: COLONOSCOPY WITH PROPOFOL;  Surgeon: Robert Bellow, MD;  Location: [facility] ENDOSCOPY;  Service: Endoscopy;  Laterality: N/A;  . IR GASTROSTOMY TUBE MOD SED  09/24/2020  . MANDIBLE SURGERY    . PORT-A-CATH REMOVAL  07/17/2018  . PORTA CATH INSERTION N/A 12/19/2017   Procedure: PORTA CATH INSERTION;  Surgeon: Algernon Huxley, MD;  Location: [place] CV LAB;  Service: Cardiovascular;  Laterality: N/A;  . PORTA CATH INSERTION N/A 07/17/2018   Procedure: PORTA CATH INSERTION;  Surgeon: Algernon Huxley, MD;  Location: [place] CV LAB;  Service: Cardiovascular;  Laterality: N/A;  . PORTA CATH INSERTION N/A 09/18/2019   Procedure: PORTA CATH INSERTION;  Surgeon: Algernon Huxley, MD;  Location: [place] CV LAB;  Service: Cardiovascular;  Laterality: N/A;  . TONSILLECTOMY      SOCIAL HISTORY: Social History   Socioeconomic History  . Marital status: Single    Spouse name: Not on file  . Number of children: Not on file  . Years of education: Not on file  . Highest education level: Not on file  Occupational History  . Not on file  Tobacco Use  . Smoking status: Never Smoker  . Smokeless tobacco: Never Used  Vaping Use  . Vaping Use: Never used  Substance and Sexual Activity  . Alcohol use: No  . Drug use: No  . Sexual activity: Not on file  Other Topics Concern  . Not on file  Social History Narrative  . Not on file   Social Determinants of Health   Financial Resource Strain: Not on file  Food Insecurity: Not on file  Transportation Needs: Not on file  Physical Activity: Not on file  Stress: Not on file  Social Connections: Not on file  Intimate Partner Violence: Not on file    FAMILY HISTORY: Family History  Problem Relation Age of Onset  . Breast cancer Mother   . Asthma Mother   . Congestive Heart Failure Mother   . Prostate cancer Father   . Brain cancer Father   . Bladder Cancer Father     ALLERGIES:  has No Known  Allergies.  MEDICATIONS:  Current Outpatient Medications  Medication Sig Dispense Refill  . amLODipine (NORVASC) 5 MG tablet Take 5 mg by mouth daily.    Marland Kitchen lidocaine-prilocaine (EMLA) cream Apply to affected area once 30 g 3  . omeprazole (PRILOSEC) 20 MG capsule Take 1 capsule (20 mg total) by mouth daily. 90 capsule 1  . rosuvastatin (CRESTOR) 10 MG tablet Take 10 mg by mouth every evening.  4  . Alpelisib (PIQRAY, 300 MG DAILY DOSE, PO) Take 300 mg by mouth daily.    . cetirizine (ZYRTEC) 5 MG tablet Take 5 mg by mouth daily.    Marland Kitchen dexamethasone (DECADRON) 1 MG tablet Take 1 mg by mouth 2 (two) times daily with a meal. (Patient not taking: No sig reported)    . lidocaine (XYLOCAINE) 2 % solution  (Patient not taking: No sig reported)    . morphine (ROXANOL) 20 MG/ML concentrated solution Take 1 mL (20 mg total) by mouth every 4 (four) hours as needed for moderate pain or severe pain. (Patient not taking: No sig reported) 30 mL 0  . nystatin (MYCOSTATIN) 100000 UNIT/ML suspension Take 5 mLs (500,000 Units total) by  mouth in the morning, at noon, and at bedtime. SWISH AND SPIT (Patient not taking: No sig reported) 473 mL 0  . ondansetron (ZOFRAN) 8 MG tablet Take 1 tablet (8 mg total) by mouth 2 (two) times daily as needed for refractory nausea / vomiting. Start on day 3 after carboplatin chemo. (Patient not taking: No sig reported) 30 tablet 1  . prochlorperazine (COMPAZINE) 10 MG tablet Take 1 tablet (10 mg total) by mouth every 6 (six) hours as needed (Nausea or vomiting). (Patient not taking: No sig reported) 30 tablet 1  . Sennosides (SENNA) 8.8 MG/5ML LIQD Take 17.6 mg by mouth daily. (Patient not taking: No sig reported) 236 mL 1  . sucralfate (CARAFATE) 1 g tablet Take 1 tablet (1 g total) by mouth 3 (three) times daily. Dissolve in 3-4 tbsp warm water, swish and swallow. (Patient not taking: Reported on 01/06/2021) 90 tablet 3   No current facility-administered medications for this visit.    Facility-Administered Medications Ordered in Other Visits  Medication Dose Route Frequency Provider Last Rate Last Admin  . sodium chloride flush (NS) 0.9 % injection 10 mL  10 mL Intravenous PRN Earlie Server, MD      . sodium chloride flush (NS) 0.9 % injection 10 mL  10 mL Intravenous PRN Earlie Server, MD   10 mL at 10/13/20 1034     PHYSICAL EXAMINATION: ECOG PERFORMANCE STATUS: 1 - Symptomatic but completely ambulatory Vitals:   01/06/21 1109  BP: 135/85  Pulse: 92  Resp: 16  Temp: (!) 97.2 F (36.2 C)   Physical Exam Constitutional:      General: He is not in acute distress.    Appearance: He is not diaphoretic.  HENT:     Head: Normocephalic and atraumatic.     Nose: Nose normal.     Mouth/Throat:     Pharynx: No oropharyngeal exudate.  Eyes:     General: No scleral icterus.    Pupils: Pupils are equal, round, and reactive to light.  Neck:     Comments: Right cervical lymphadenopathy Cardiovascular:     Rate and Rhythm: Normal rate and regular rhythm.     Heart sounds: No murmur heard.   Pulmonary:     Effort: Pulmonary effort is normal. No respiratory distress.     Breath sounds: No rales.  Chest:     Chest wall: No tenderness.  Abdominal:     General: There is no distension.     Palpations: Abdomen is soft.     Tenderness: There is no abdominal tenderness.  Musculoskeletal:        General: Normal range of motion.     Cervical back: Normal range of motion and neck supple.  Skin:    General: Skin is warm and dry.     Findings: No erythema.  Neurological:     Mental Status: He is alert and oriented to person, place, and time.     Cranial Nerves: No cranial nerve deficit.     Motor: No abnormal muscle tone.     Coordination: Coordination normal.  Psychiatric:        Mood and Affect: Affect normal.      LABORATORY DATA:  I have reviewed the data as listed Lab Results  Component Value Date   WBC 6.8 01/06/2021   HGB 15.7 01/06/2021   HCT 46.7  01/06/2021   MCV 89.6 01/06/2021   PLT 299 01/06/2021   Recent Labs    08/17/20 0804 08/23/20 1414  08/31/20 1244 09/07/20 0833 12/07/20 1515 12/22/20 1007 01/06/21 1025  NA 139 137 137   < > 135 137 136  K 4.0 3.6 4.5   < > 4.0 4.1 4.7  CL 105 100 98   < > 99 99 99  CO2 $Re'25 26 27   'ZPz$ < > $R'27 29 28  'HG$ GLUCOSE 101* 129* 134*   < > 108* 87 202*  BUN 23 21 25*   < > 22 28* 26*  CREATININE 1.52* 1.50* 1.64*   < > 0.98 1.08 1.22  CALCIUM 9.2 8.9 9.3   < > 9.1 9.5 9.1  GFRNONAA 47* 48* 43*   < > >60 >60 >60  GFRAA 55* 56* 50*  --   --   --   --   PROT 7.4 7.0 7.3   < > 6.8 7.0 7.3  ALBUMIN 3.5 3.5 3.5   < > 3.7 3.6 3.7  AST $Re'19 18 20   'SGx$ < > $R'16 17 17  'mh$ ALT $'16 16 17   'J$ < > $R'18 19 19  'BG$ ALKPHOS 82 93 103   < > 69 63 67  BILITOT 0.7 0.8 0.6   < > 0.6 0.2* 0.5   < > = values in this interval not displayed.  RADIOGRAPHIC STUDIES: I have personally reviewed the radiological images as listed and agreed with the findings in the report. CT SOFT TISSUE NECK W CONTRAST  Result Date: 01/05/2021 CLINICAL DATA:  Squamous cell carcinoma of oropharynx, multiple recurrences and treatments, follow-up EXAM: CT NECK WITH CONTRAST TECHNIQUE: Multidetector CT imaging of the neck was performed using the standard protocol following the bolus administration of intravenous contrast. CONTRAST:  53mL OMNIPAQUE IOHEXOL 300 MG/ML  SOLN COMPARISON:  PET-CT 11/10/2020 FINDINGS: Pharynx and larynx: Post radiation changes are present. Medialization of the right vocal cord. No evidence of local recurrent disease. Salivary glands: Post radiation changes.  Otherwise unremarkable. Thyroid: Unremarkable. Lymph nodes: Confluent soft tissue is again identified near the thoracic inlet along the right aspect of the esophagus and posterior to the right lobe of the thyroid. Margins are poorly delineated and therefore measurement is difficult, but there is no progression since the 11/09/2020 PET-CT. No new adenopathy identified. Vascular: Major neck  vessels are patent. Limited intracranial: No abnormal enhancement. Visualized orbits: Unremarkable. Mastoids and visualized paranasal sinuses: Minor paranasal sinus mucosal thickening. Mastoid air cells are clear. Skeleton: Cervical spine degenerative changes. Upper chest: Dictated separately. Other: None. IMPRESSION: No progression of right paraesophageal metastatic nodal conglomerate since 11/10/2020 PET-CT. Comparison is difficult and this lesion is either stable or decreased in size. No new adenopathy in the neck. Stable post treatment appearance of the pharynx and larynx. Electronically Signed   By: Macy Mis M.D.   On: 01/05/2021 10:57   CT Chest W Contrast  Result Date: 01/05/2021 CLINICAL DATA:  Restaging squamous cell carcinoma the oropharynx and chest. EXAM: CT CHEST WITH CONTRAST TECHNIQUE: Multidetector CT imaging of the chest was performed during intravenous contrast administration. CONTRAST:  86mL OMNIPAQUE IOHEXOL 300 MG/ML  SOLN COMPARISON:  PET-CT 11/09/2020 and CT scan 09/07/2020 FINDINGS: Cardiovascular: The heart is normal in size. No pericardial effusion. The aorta is normal in caliber. No dissection. Scattered atherosclerotic calcifications. Stable coronary artery calcifications. The pulmonary arteries are unremarkable. Mediastinum/Nodes: Persistent soft tissue density the involving the thoracic inlet region posterior to the cricoid cartilage and extending down into the upper mediastinum. This measures a maximum 3.7 x 2.0 cm. On the prior CT scan it measured  5.5 x 4.0 cm and now appears less solid. When compared to the prior CT scan from 09/02/2020 the right paratracheal nodal mass has significantly decreased in size. It measures 6.5 mm on image number 17/4 and previously measured 17 mm. Right hilar node on image number 29/for measures 11 mm and previously measured 17 mm. Precarinal lymph node on image number 26/4 measures 5 mm and previously measured 8 mm. No new or progressive  findings. The esophagus is grossly normal. Lungs/Pleura: 10.5 x 10.5 mm right upper lobe pulmonary nodule on image number 88/6 previously measured 15 x 14 mm. Other areas of scattered ground-glass opacity in the right lung are new and likely inflammatory. Some of these were present on the prior PET-CT from December. Mild bronchiectasis and patchy ground-glass opacity in the right middle lobe appears slightly improved when compared to the prior PET-CT. No pleural effusions or pleural nodules. Upper Abdomen: No significant upper abdominal findings. Stable left hepatic lobe cyst. A feeding gastrostomy tube is noted. No adrenal gland lesions. Musculoskeletal: No significant bony findings. IMPRESSION: 1. Interval regression of thoracic inlet lesion, mediastinal and right hilar adenopathy and right upper lobe pulmonary nodule. No new or progressive findings. 2. Patchy ground-glass opacities in the right lung are likely inflammatory. 8 mm sub solid right upper lobe nodule will require surveillance. 3. No findings for upper abdominal metastatic disease or osseous metastatic disease. Aortic Atherosclerosis (ICD10-I70.0) and Emphysema (ICD10-J43.9). Electronically Signed   By: Marijo Sanes M.D.   On: 01/05/2021 10:41   NM PET Image Restag (PS) Skull Base To Thigh  Result Date: 11/10/2020 CLINICAL DATA:  Subsequent treatment strategy for oropharyngeal squamous cell carcinoma, most recent radiation therapy 2 weeks prior. EXAM: NUCLEAR MEDICINE PET SKULL BASE TO THIGH TECHNIQUE: 9.4 mCi F-18 FDG was injected intravenously. Full-ring PET imaging was performed from the skull base to thigh after the radiotracer. CT data was obtained and used for attenuation correction and anatomic localization. Fasting blood glucose: 78 mg/dl COMPARISON:  04/26/2020 PET-CT. Outside CT neck and chest from 09/02/2020. FINDINGS: Mediastinal blood pool activity: SUV max 2.3 Liver activity: SUV max NA NECK: Right thoracic inlet hypermetabolic 3.9 x  2.6 cm soft tissue mass located posterior to the right thyroid lobe and to the right of esophagus with max SUV 12.0 (series 3/image 67), decreased from 5.2 x 3.5 cm on 09/02/2020 outside neck CT, increased in size from 2.1 x 1.8 cm and similar in uptake with max SUV 12.9 on 04/26/2020 PET-CT. No additional hypermetabolic neck lymph nodes. Incidental CT findings: Right internal jugular Port-A-Cath terminates at the cavoatrial junction. CHEST: Right paratracheal hypermetabolic 1.2 cm lymph node with max SUV 4.0 (series 3/image 81), decreased in size from 1.7 cm on 09/02/2020 outside chest CT, increased in size from 1.0 cm and decreased in uptake with max SUV 6.2 on 04/26/2020 PET-CT. Hypermetabolic right hilar lymph node with max SUV 5.2, previous max SUV 8.3 on 04/26/2020 PET-CT, decreased. No new enlarged or hypermetabolic axillary, mediastinal or hilar lymph nodes. Mildly hypermetabolic solid 1.2 cm right upper lobe pulmonary nodule with max SUV 3.0 (series 3/image 112), previously 1.2 cm with max SUV 4.2, stable in size and mildly decreased in metabolism. Low level nonfocal hypermetabolism associated with new patchy ground-glass opacity in the peripheral right upper lobe (series 3/image 102), presumably inflammatory. No additional hypermetabolic pulmonary findings. Incidental CT findings: Coronary atherosclerosis. Atherosclerotic nonaneurysmal thoracic aorta. Mild centrilobular emphysema. ABDOMEN/PELVIS: No abnormal hypermetabolic activity within the liver, pancreas, adrenal glands, or spleen. No hypermetabolic  lymph nodes in the abdomen or pelvis. Incidental CT findings: Simple 1.8 cm left liver cyst. Atherosclerotic nonaneurysmal abdominal aorta. Simple 5.1 cm medial upper right renal cyst. Well-positioned percutaneous gastrostomy tube terminating in the body of the stomach. Mild diffuse colonic diverticulosis. SKELETON: No focal hypermetabolic activity to suggest skeletal metastasis. Incidental CT findings:  none IMPRESSION: 1. Hypermetabolic soft tissue metastasis at the right thoracic inlet, decreased in size since outside 09/02/2020 neck CT, increased in size and similar in uptake compared to 04/26/2020 PET-CT. 2. Hypermetabolic right paratracheal and right hilar nodal metastases, decreased in size from outside 09/02/2020 chest CT, decreased in FDG uptake since 04/26/2020 PET-CT. 3. Mildly hypermetabolic right upper lobe solid pulmonary nodule, stable in size and mildly decreased in metabolism since 04/26/2020 PET-CT. 4. No new or progressive hypermetabolic metastatic disease. 5. Chronic findings include: Aortic Atherosclerosis (ICD10-I70.0) and Emphysema (ICD10-J43.9). Coronary atherosclerosis. Mild diffuse colonic diverticulosis. Electronically Signed   By: Ilona Sorrel M.D.   On: 11/10/2020 10:20     ASSESSMENT & PLAN:   1. 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[place])   2. Dysphagia, unspecified type   3. Encounter for antineoplastic chemotherapy   4. Port-A-Cath in place   5. Hyperglycemia    #Metastatic squamous cell carcinoma of oropharynx- cervical lymphadenopathy, lung metastatic disease, local invasion of esophagus. Patient finished palliative radiation to cervical lymphadenopathy/peri-esophageal mass. 01/05/21 CT chest w contrast was reviewed and discussed with patient.  Partial response.  Continue Piqray $RemoveBeforeD'300mg'MxyrmSUlyRLIRP$  daily- off label use  Start Zyrtec for rash prophylaxis.   #Hypoglycemia, likely secondary to  [facility].  Continue to follow.  I will check A1c at the next visit. # Dysphagia, s/p PEG tube placement.   If patient develops persistent hypoglycemia, will consider to switch to lower carbohydrate formula.  # Neoplasm associated pain, no pain currently. Symptoms are stable. #Port-A-Cath in place, recommend patient to keep the port and continue port flush every 8 weeks.  Follow-up: Follow-up in 4 weeks. Earlie Server, MD, PhD Hematology Oncology [place] at [facility] 01/06/2021

## 2021-01-06 NOTE — Progress Notes (Signed)
Section  [phone] [phone]  Patient Care Team: Albina Billet, MD as PCP - General (Internal Medicine) Albina Billet, MD (Internal Medicine) Bary Castilla Forest Gleason, MD (General Surgery) Noreene Filbert, MD as Referring Physician (Radiation Oncology) Earlie Server, MD as Consulting Physician (Oncology)   Name of the patient: Ivan Garcia  [id number]  06-20-1956   Date of visit: 01/06/21  HPI: Patient is a 65 y.o. 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 He is most recently s/p docetaxel and carboplatin therapy as a bridge to radiation.   Reason for Consult: Piqray (alpelisib) oral chemotherapy education.   PAST MEDICAL HISTORY: Past Medical History:  Diagnosis Date  . Arthritis   . Cancer ([place])    Head and neck cancer  . Hemorrhoids   . Hyperlipidemia   . Hypertension     PAST SURGICAL HISTORY:  Past Surgical History:  Procedure Laterality Date  . COLONOSCOPY WITH PROPOFOL N/A 04/04/2017   Procedure: COLONOSCOPY WITH PROPOFOL;  Surgeon: Robert Bellow, MD;  Location[facility] END[facility];  Service: Endoscopy;  Laterality: N/A;  . IR GASTROSTOMY TUBE MOD SED  09/24/2020  . MANDIBLE SURGERY    . PORT-A-CATH REMOVAL  07/17/2018  . PORTA CATH INSERTION N/A 12/19/2017   Procedure: PORTA CATH INSERTION;  Surgeon: Algernon Huxley, MD;  Location: [facility];  Service: Cardiovascular;  Laterality: N/A;  . PORTA CATH INSERTION N/A 07/17/2018   Procedure: PORTA CATH INSERTION;  Surgeon: Algernon Huxley, MD;  Location: [place] [facility];  Service: Cardiovascular;  Laterality: N/A;  . PORTA CATH INSERTION N/A 09/18/2019   Procedure: PORTA CATH INSERTION;  Surgeon: Algernon Huxley, MD;  Location: [place] [facility];  Service: Cardiovascular;  Laterality: N/A;  . TONSILLECTOMY      HEMATOLOGY/ONCOLOGY HISTORY:  Oncology History  Squamous cell carcinoma of  oropharynx ([place])  12/14/2017 Initial Diagnosis   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[place])   09/17/2019 - 07/27/2020 Chemotherapy   The patient had [No matching medication found in this treatment plan]  for chemotherapy treatment.    09/29/2019 - 04/06/2020 Chemotherapy   The patient had palonosetron (ALOXI) injection 0.25 mg, 0.25 mg, Intravenous,  Once, 6 of 6 cycles Administration: 0.25 mg (09/29/2019), 0.25 mg (12/22/2019), 0.25 mg (10/20/2019), 0.25 mg (12/01/2019), 0.25 mg (11/10/2019), 0.25 mg (01/12/2020) pegfilgrastim (NEULASTA ONPRO KIT) injection 6 mg, 6 mg, Subcutaneous, Once, 2 of 2 cycles Administration: 6 mg (12/26/2019), 6 mg (01/16/2020) pembrolizumab (KEYTRUDA) 200 mg in sodium chloride 0.9 % 50 mL chemo infusion, 200 mg (100 % of original dose 200 mg), Intravenous, Once, 10 of 10 cycles Dose modification: 200 mg (original dose 200 mg, Cycle 1, Reason: Provider Judgment) Administration: 200 mg (09/29/2019), 200 mg (10/20/2019), 200 mg (12/01/2019), 200 mg (12/22/2019), 200 mg (11/10/2019), 200 mg (01/12/2020), 200 mg (02/02/2020), 200 mg (02/23/2020), 200 mg (03/15/2020), 200 mg (04/06/2020) fosaprepitant (EMEND) 150 mg, dexamethasone (DECADRON) 12 mg in sodium chloride 0.9 % 145 mL IVPB, , Intravenous,  Once, 6 of 6 cycles Administration:  (09/29/2019),  (12/22/2019),  (10/20/2019),  (12/01/2019),  (11/10/2019),  (01/12/2020) fluorouracil (ADRUCIL) 7,900 mg in sodium chloride 0.9 % 92 mL chemo infusion, 1,000 mg/m2/day = 7,900 mg, Intravenous, 4D (96 hours ), 6 of 6 cycles Dose modification: 900 mg/m2/day (original dose 1,000 mg/m2/day, Cycle 5, Reason: Dose not tolerated), 850 mg/m2/day (original dose 1,000 mg/m2/day, Cycle 5, Reason: Dose not tolerated) Administration: 7,900  mg (09/29/2019), 6,750 mg (12/22/2019), 7,900 mg (10/20/2019), 6,750 mg (12/01/2019), 7,150 mg (11/10/2019), 6,750 mg (01/12/2020) CARBOplatin (PARAPLATIN) 500 mg in sodium chloride 0.9 % 250 mL chemo infusion, 500 mg (100 % of  original dose 499 mg), Intravenous,  Once, 6 of 6 cycles Dose modification:   (original dose 499 mg, Cycle 1),   (original dose 440 mg, Cycle 5) Administration: 500 mg (09/29/2019), 440 mg (10/20/2019), 390 mg (12/01/2019), 440 mg (11/10/2019), 430 mg (01/12/2020)  for chemotherapy treatment.    04/28/2020 - 04/28/2020 Chemotherapy   The patient had cetuximab (ERBITUX) chemo infusion 800 mg, 400 mg/m2, Intravenous, Once, 0 of 8 cycles  for chemotherapy treatment.    05/14/2020 - 05/14/2020 Chemotherapy   The patient had pembrolizumab (KEYTRUDA) 200 mg in sodium chloride 0.9 % 50 mL chemo infusion, 200 mg, Intravenous, Once, 1 of 4 cycles Administration: 200 mg (05/14/2020)  for chemotherapy treatment.    07/06/2020 -  Chemotherapy   The patient had palonosetron (ALOXI) injection 0.25 mg, 0.25 mg, Intravenous,  Once, 1 of 1 cycle Administration: 0.25 mg (09/07/2020) pegfilgrastim-jmdb (FULPHILA) injection 6 mg, 6 mg, Subcutaneous,  Once, 4 of 4 cycles Administration: 6 mg (07/08/2020), 6 mg (07/29/2020), 6 mg (08/19/2020), 6 mg (09/09/2020) CARBOplatin (PARAPLATIN) 430 mg in sodium chloride 0.9 % 250 mL chemo infusion, 460 mg (100 % of original dose 464.5 mg), Intravenous,  Once, 1 of 1 cycle Dose modification:   (original dose 464.5 mg, Cycle 4) Administration: 430 mg (09/07/2020) DOCEtaxel (TAXOTERE) 150 mg in sodium chloride 0.9 % 250 mL chemo infusion, 75 mg/m2 = 150 mg, Intravenous,  Once, 4 of 4 cycles Administration: 150 mg (07/06/2020), 150 mg (07/27/2020), 150 mg (08/17/2020), 150 mg (09/07/2020) fosaprepitant (EMEND) 150 mg in sodium chloride 0.9 % 145 mL IVPB, 150 mg, Intravenous,  Once, 1 of 1 cycle Administration: 150 mg (09/07/2020)  for chemotherapy treatment.      ALLERGIES:  has No Known Allergies.  MEDICATIONS:  Current Outpatient Medications  Medication Sig Dispense Refill  . amLODipine (NORVASC) 5 MG tablet Take 5 mg by mouth daily.    Marland Kitchen dexamethasone (DECADRON) 1 MG tablet Take 1 mg  by mouth 2 (two) times daily with a meal. (Patient not taking: No sig reported)    . lidocaine (XYLOCAINE) 2 % solution  (Patient not taking: No sig reported)    . lidocaine-prilocaine (EMLA) cream Apply to affected area once 30 g 3  . morphine (ROXANOL) 20 MG/ML concentrated solution Take 1 mL (20 mg total) by mouth every 4 (four) hours as needed for moderate pain or severe pain. (Patient not taking: No sig reported) 30 mL 0  . nystatin (MYCOSTATIN) 100000 UNIT/ML suspension Take 5 mLs (500,000 Units total) by mouth in the morning, at noon, and at bedtime. SWISH AND SPIT (Patient not taking: No sig reported) 473 mL 0  . omeprazole (PRILOSEC) 20 MG capsule Take 1 capsule (20 mg total) by mouth daily. 90 capsule 1  . ondansetron (ZOFRAN) 8 MG tablet Take 1 tablet (8 mg total) by mouth 2 (two) times daily as needed for refractory nausea / vomiting. Start on day 3 after carboplatin chemo. (Patient not taking: No sig reported) 30 tablet 1  . prochlorperazine (COMPAZINE) 10 MG tablet Take 1 tablet (10 mg total) by mouth every 6 (six) hours as needed (Nausea or vomiting). (Patient not taking: No sig reported) 30 tablet 1  . rosuvastatin (CRESTOR) 10 MG tablet Take 10 mg by mouth every evening.  4  .  Sennosides (SENNA) 8.8 MG/5ML LIQD Take 17.6 mg by mouth daily. (Patient not taking: No sig reported) 236 mL 1  . sucralfate (CARAFATE) 1 g tablet Take 1 tablet (1 g total) by mouth 3 (three) times daily. Dissolve in 3-4 tbsp warm water, swish and swallow. (Patient not taking: Reported on 01/06/2021) 90 tablet 3   No current facility-administered medications for this visit.   Facility-Administered Medications Ordered in Other Visits  Medication Dose Route Frequency Provider Last Rate Last Admin  . sodium chloride flush (NS) 0.9 % injection 10 mL  10 mL Intravenous PRN Earlie Server, MD      . sodium chloride flush (NS) 0.9 % injection 10 mL  10 mL Intravenous PRN Earlie Server, MD   10 mL at 10/13/20 1034    VITAL  SIGNS: There were no vitals taken for this visit. There were no vitals filed for this visit.  Estimated body mass index is 26.26 kg/m as calculated from the following:   Height as of 09/24/20: _0  (1.778 m).   Weight as of an earlier encounter on 01/06/21: 83 kg (183 lb).  LABS: CBC:    Component Value Date/Time   WBC 6.8 01/06/2021 1025   HGB 15.7 01/06/2021 1025   HCT 46.7 01/06/2021 1025   PLT 299 01/06/2021 1025   MCV 89.6 01/06/2021 1025   NEUTROABS 5.7 01/06/2021 1025   LYMPHSABS 0.5 (L) 01/06/2021 1025   MONOABS 0.3 01/06/2021 1025   EOSABS 0.2 01/06/2021 1025   BASOSABS 0.1 01/06/2021 1025   Comprehensive Metabolic Panel:    Component Value Date/Time   NA 136 01/06/2021 1025   K 4.7 01/06/2021 1025   CL 99 01/06/2021 1025   CO2 28 01/06/2021 1025   BUN 26 (H) 01/06/2021 1025   CREATININE 1.22 01/06/2021 1025   GLUCOSE 202 (H) 01/06/2021 1025   CALCIUM 9.1 01/06/2021 1025   AST 17 01/06/2021 1025   ALT 19 01/06/2021 1025   ALKPHOS 67 01/06/2021 1025   BILITOT 0.5 01/06/2021 1025   PROT 7.3 01/06/2021 1025   ALBUMIN 3.7 01/06/2021 1025    RADIOGRAPHIC STUDIES: CT SOFT TISSUE NECK W CONTRAST  Result Date: 01/05/2021 CLINICAL DATA:  Squamous cell carcinoma of oropharynx, multiple recurrences and treatments, follow-up EXAM: CT NECK WITH CONTRAST TECHNIQUE: Multidetector CT imaging of the neck was performed using the standard protocol following the bolus administration of intravenous contrast. CONTRAST:  70m OMNIPAQUE IOHEXOL 300 MG/ML  SOLN COMPARISON:  PET-CT 11/10/2020 FINDINGS: Pharynx and larynx: Post radiation changes are present. Medialization of the right vocal cord. No evidence of local recurrent disease. Salivary glands: Post radiation changes.  Otherwise unremarkable. Thyroid: Unremarkable. Lymph nodes: Confluent soft tissue is again identified near the thoracic inlet along the right aspect of the esophagus and posterior to the right lobe of the thyroid.  Margins are poorly delineated and therefore measurement is difficult, but there is no progression since the 11/09/2020 PET-CT. No new adenopathy identified. Vascular: Major neck vessels are patent. Limited intracranial: No abnormal enhancement. Visualized orbits: Unremarkable. Mastoids and visualized paranasal sinuses: Minor paranasal sinus mucosal thickening. Mastoid air cells are clear. Skeleton: Cervical spine degenerative changes. Upper chest: Dictated separately. Other: None. IMPRESSION: No progression of right paraesophageal metastatic nodal conglomerate since 11/10/2020 PET-CT. Comparison is difficult and this lesion is either stable or decreased in size. No new adenopathy in the neck. Stable post treatment appearance of the pharynx and larynx. Electronically Signed   By: PMacy MisM.D.   On: 01/05/2021  10:57   CT Chest W Contrast  Result Date: 01/05/2021 CLINICAL DATA:  Restaging squamous cell carcinoma the oropharynx and chest. EXAM: CT CHEST WITH CONTRAST TECHNIQUE: Multidetector CT imaging of the chest was performed during intravenous contrast administration. CONTRAST:  9m OMNIPAQUE IOHEXOL 300 MG/ML  SOLN COMPARISON:  PET-CT 11/09/2020 and CT scan 09/07/2020 FINDINGS: Cardiovascular: The heart is normal in size. No pericardial effusion. The aorta is normal in caliber. No dissection. Scattered atherosclerotic calcifications. Stable coronary artery calcifications. The pulmonary arteries are unremarkable. Mediastinum/Nodes: Persistent soft tissue density the involving the thoracic inlet region posterior to the cricoid cartilage and extending down into the upper mediastinum. This measures a maximum 3.7 x 2.0 cm. On the prior CT scan it measured 5.5 x 4.0 cm and now appears less solid. When compared to the prior CT scan from 09/02/2020 the right paratracheal nodal mass has significantly decreased in size. It measures 6.5 mm on image number 17/4 and previously measured 17 mm. Right hilar node on  image number 29/for measures 11 mm and previously measured 17 mm. Precarinal lymph node on image number 26/4 measures 5 mm and previously measured 8 mm. No new or progressive findings. The esophagus is grossly normal. Lungs/Pleura: 10.5 x 10.5 mm right upper lobe pulmonary nodule on image number 88/6 previously measured 15 x 14 mm. Other areas of scattered ground-glass opacity in the right lung are new and likely inflammatory. Some of these were present on the prior PET-CT from December. Mild bronchiectasis and patchy ground-glass opacity in the right middle lobe appears slightly improved when compared to the prior PET-CT. No pleural effusions or pleural nodules. Upper Abdomen: No significant upper abdominal findings. Stable left hepatic lobe cyst. A feeding gastrostomy tube is noted. No adrenal gland lesions. Musculoskeletal: No significant bony findings. IMPRESSION: 1. Interval regression of thoracic inlet lesion, mediastinal and right hilar adenopathy and right upper lobe pulmonary nodule. No new or progressive findings. 2. Patchy ground-glass opacities in the right lung are likely inflammatory. 8 mm sub solid right upper lobe nodule will require surveillance. 3. No findings for upper abdominal metastatic disease or osseous metastatic disease. Aortic Atherosclerosis (ICD10-I70.0) and Emphysema (ICD10-J43.9). Electronically Signed   By: PMarijo SanesM.D.   On: 01/05/2021 10:41     Assessment and Plan-  Mr. HBrannockstarted Piqray ~10 days ago (~1/25). He reports no side effects or issues from the medication. A baseline A1c was obtained today. Blood glucose today was elevated at 202, but is not a fasting glucose. He will return on 2/17 for a fasting glucose lab. We will work with RD to switch tube feeds to a low glucose formulation. He will start Zyrtec to prevent rash with Piqray.    Patient Education I spoke with patient for overview of new oral chemotherapy medication: Piqray (alpelisib) for the  treatment of PIK3CA mutated recurrent oropharynx squamous cell cancer, planned duration until disease progression or unacceptable drug toxicity.   Pt is doing well. Counseled patient on administration, dosing, side effects, monitoring, drug-food interactions, safe handling, storage, and disposal.  Mr. HVanpattenstates he is taking 150 mg twice daily per the prescription label on the Piqray box supplied. I have spoken with both [facility] and the [facility] was told the initial application and prescription state to take two 150 mg tablets (300 mg total) once daily. He currently is able to swallow the tablets and administers concurrent feedings through his G-tube.    Side effects include but not limited to: diarrhea,  rash, and hyperglycemia. Patient has both Imodium and Zyrtec at home. He was educated to start Zyrtec for prophylaxis of rash and to use Imodium for diarrhea.   Reviewed with patient importance of keeping a medication schedule and plan for any missed doses.  After discussion with patient, no patient barriers to medication adherence identified. Patient lives by himself but reports he is very self sustaining and that he has very supportive neighbors and a sister in [place] who is very helpful.   Mr. Kahre voiced understanding and appreciation. All questions answered. Medication handout provided.  Provided patient with Oral [facility] phone number. Patient knows to call the office with questions or concerns. Oral Chemotherapy Navigation Clinic will continue to follow.  Medication Access Issues: Patient received his initial fill of Piqray through [employer] patient assistance as obtained per [facility] team. He currently has ~20 tablets left from this fill. New [employer] application is pending, but patient is able to receive another fill of Piqray through initial application if new application still pending for next refill. Patient was encouraged to contact [employer]  next week for setting up delivery of next fill.   Patient expressed understanding and was in agreement with this plan. He also understands that He can call clinic at any time with any questions, concerns, or complaints.   Thank you for allowing me to participate in the care of this very pleasant patient.   Time Total: 15 mins  Visit consisted of counseling and education on dealing with issues of symptom management in the setting of serious and potentially life-threatening illness.Greater than 50%  of this time was spent counseling and coordinating care related to the above assessment and plan.  Visit completed in conjunction with Eddie Candle, PharmD, BCPS (PGY2 Oncology Pharmacy Resident)   Signed by: Darl Pikes, PharmD, BCPS, Salley Slaughter, CPP Hematology/Oncology Clinical Pharmacist Practitioner [facility]/HP/AP [facility] [phone]  01/06/2021 1:14 PM

## 2021-01-13 NOTE — Telephone Encounter (Signed)
Oral Oncology Patient Advocate Encounter  Received notification from [employer] Patient Assistance program that patient has been successfully enrolled into their program to receive Piqray from the manufacturer at $0 out of pocket until 12/03/21.    I called and spoke with patient.  He knows we will have to re-apply.   Specialty Pharmacy that will dispense medication is [facility].  Patient knows to call the office with questions or concerns.   Oral Oncology Clinic will continue to follow.  [place] Patient [place] Phone [phone] Fax [phone] 01/13/2021 11:04 AM

## 2021-01-20 ENCOUNTER — Telehealth: Payer: Self-pay | Admitting: Pharmacist

## 2021-01-20 ENCOUNTER — Inpatient Hospital Stay: Payer: Medicare Other

## 2021-01-20 ENCOUNTER — Ambulatory Visit
Admission: RE | Admit: 2021-01-20 | Discharge: 2021-01-20 | Disposition: A | Discharge status: Home / Self Care | Payer: Medicare Other | Source: Ambulatory Visit | Attending: Radiation Oncology | Admitting: Radiation Oncology

## 2021-01-20 ENCOUNTER — Encounter: Payer: Self-pay | Admitting: Radiation Oncology

## 2021-01-20 DIAGNOSIS — Z923 Personal history of irradiation: Secondary | ICD-10-CM | POA: Insufficient documentation

## 2021-01-20 DIAGNOSIS — C109 Malignant neoplasm of oropharynx, unspecified: Secondary | ICD-10-CM

## 2021-01-20 DIAGNOSIS — C778 Secondary and unspecified malignant neoplasm of lymph nodes of multiple regions: Secondary | ICD-10-CM | POA: Diagnosis present

## 2021-01-20 DIAGNOSIS — C01 Malignant neoplasm of base of tongue: Secondary | ICD-10-CM | POA: Diagnosis not present

## 2021-01-20 LAB — COMPREHENSIVE METABOLIC PANEL
ALT: 19 U/L (ref 0–44)
AST: 16 U/L (ref 15–41)
Albumin: 4 g/dL (ref 3.5–5.0)
Alkaline Phosphatase: 73 U/L (ref 38–126)
Anion gap: 11 (ref 5–15)
BUN: 27 mg/dL — ABNORMAL HIGH (ref 8–23)
CO2: 29 mmol/L (ref 22–32)
Calcium: 9.4 mg/dL (ref 8.9–10.3)
Chloride: 100 mmol/L (ref 98–111)
Creatinine, Ser: 1.35 mg/dL — ABNORMAL HIGH (ref 0.61–1.24)
GFR, Estimated: 58 mL/min — ABNORMAL LOW (ref >60)
Glucose, Bld: 90 mg/dL (ref 70–99)
Potassium: 4.2 mmol/L (ref 3.5–5.1)
Sodium: 140 mmol/L (ref 135–145)
Total Bilirubin: 0.8 mg/dL (ref 0.3–1.2)
Total Protein: 7.6 g/dL (ref 6.5–8.1)

## 2021-01-20 LAB — CBC WITH DIFFERENTIAL/PLATELET
Abs Immature Granulocytes: 0.01 10*3/uL (ref 0.00–0.07)
Basophils Absolute: 0.1 10*3/uL (ref 0.0–0.1)
Basophils Relative: 1 %
Eosinophils Absolute: 0.4 10*3/uL (ref 0.0–0.5)
Eosinophils Relative: 5 %
HCT: 47.2 % (ref 39.0–52.0)
Hemoglobin: 15.8 g/dL (ref 13.0–17.0)
Immature Granulocytes: 0 %
Lymphocytes Relative: 5 %
Lymphs Abs: 0.4 10*3/uL — ABNORMAL LOW (ref 0.7–4.0)
MCH: 29.8 pg (ref 26.0–34.0)
MCHC: 33.5 g/dL (ref 30.0–36.0)
MCV: 89.1 fL (ref 80.0–100.0)
Monocytes Absolute: 0.7 10*3/uL (ref 0.1–1.0)
Monocytes Relative: 8 %
Neutro Abs: 6.2 10*3/uL (ref 1.7–7.7)
Neutrophils Relative %: 81 %
Platelets: 223 10*3/uL (ref 150–400)
RBC: 5.3 MIL/uL (ref 4.22–5.81)
RDW: 14.3 % (ref 11.5–15.5)
WBC: 7.8 10*3/uL (ref 4.0–10.5)
nRBC: 0 % (ref 0.0–0.2)

## 2021-01-20 NOTE — Progress Notes (Signed)
Radiation Oncology Follow up Note  Name: Ivan Garcia [facility]   Date:   01/20/2021 MRN:  [id number] DOB: [birth year]    This 65 y.o. male presents to the clinic today for 1 month follow-up status post split course radiation therapy to his chest and supraclavicular region for locally bad squamous cell carcinoma the head and neck.  REFERRING PROVIDER: Albina Billet, MD  HPI: Patient is a 65 year old male now at 1 month having completed radiation therapy to both his supraclavicular region as well as his chest for locally bad squamous cell carcinoma of the head and neck.  He is seen today in routine follow-up is doing well specifically [place] dysphagia.  Has a mild nonproductive cough..  He recently a CT scan showing interval regression of thoracic inlet lesion mediastinal and right hilar adenopathy and right upper lobe pulmonary nodule no new or progressive findings.  He is currently onPiqray which she is tolerating well.  COMPLICATIONS OF TREATMENT: none  FOLLOW UP COMPLIANCE: keeps appointments   PHYSICAL EXAM:  BP (P) 132/90 (BP Location: Left Arm, Patient Position: Sitting)   Pulse (!) (P) 105   Temp (!) (P) 96.7 F (35.9 C) (Tympanic)   Resp (P) 16   Wt (P) 184 lb 1.6 oz (83.5 kg)   BMI (P) 26.42 kg/m  Well-developed well-nourished patient in NAD. HEENT reveals PERLA, EOMI, discs not visualized.  Oral cavity is clear. No oral mucosal lesions are identified. Neck is clear without evidence of cervical or supraclavicular adenopathy. Lungs are clear to A&P. Cardiac examination is essentially unremarkable with regular rate and rhythm without murmur rub or thrill. Abdomen is benign with no organomegaly or masses noted. Motor sensory and DTR levels are equal and symmetric in the upper and lower extremities. Cranial nerves II through XII are grossly intact. Proprioception is intact. No peripheral adenopathy or edema is identified. No motor or sensory levels are noted. Crude visual fields are within  normal range.  RADIOLOGY RESULTS: CT scans of chest and soft tissue of the head and neck reviewed compatible with above-stated findings  PLAN: At the present time patient had excellent response to concurrent chemoradiation.  And pleased with his overall progress.  He continues onPiqray under medical oncology's direction.  I have asked to see him back in 4 to 5 months for follow-up.  Patient knows to call with any concerns.  I would like to take this opportunity to thank you for allowing me to participate in the care of your patient.Noreene Filbert, MD

## 2021-01-20 NOTE — Telephone Encounter (Signed)
Oral Chemotherapy Pharmacist Encounter  Patient Follow Up I spoke with patient for follow up after starting new oral chemotherapy medication: Piqray (alpelisib) for the treatment of PIK3CA mutated squamous cell cancer of oropharynx, planned duration until disease progression or unacceptable drug toxicity.   Pt is doing well. He reports no rash or diarrhea. His fasting blood sugar today was 90. He continues with [employer] tube feeds. He did not start prophylactic Zyrtec, will remove from med list.   Mr. Kreuzer reports no missed doses and states he is taking 2x 150 mg capsules (300 mg total) as prescribed.   He reports he just received a new supply of Piqray from [employer] and therefore has adequate medication supply at this time.   Patient is scheduled for labs and follow up with MD on 3/3.   Eddie Candle, PharmD, BCPS PGY2 Hematology/Oncology Pharmacy Resident Oral Chemotherapy Navigation Clinic 01/20/2021 2:16 PM

## 2021-02-01 ENCOUNTER — Inpatient Hospital Stay: Payer: Medicare Other | Attending: Hospice and Palliative Medicine | Admitting: Hospice and Palliative Medicine

## 2021-02-01 ENCOUNTER — Inpatient Hospital Stay: Payer: Medicare Other

## 2021-02-01 DIAGNOSIS — R1310 Dysphagia, unspecified: Secondary | ICD-10-CM | POA: Insufficient documentation

## 2021-02-01 DIAGNOSIS — Z452 Encounter for adjustment and management of vascular access device: Secondary | ICD-10-CM | POA: Insufficient documentation

## 2021-02-01 DIAGNOSIS — C770 Secondary and unspecified malignant neoplasm of lymph nodes of head, face and neck: Secondary | ICD-10-CM | POA: Insufficient documentation

## 2021-02-01 DIAGNOSIS — Z515 Encounter for palliative care: Secondary | ICD-10-CM | POA: Diagnosis not present

## 2021-02-01 DIAGNOSIS — Z79899 Other long term (current) drug therapy: Secondary | ICD-10-CM | POA: Insufficient documentation

## 2021-02-01 DIAGNOSIS — C7800 Secondary malignant neoplasm of unspecified lung: Secondary | ICD-10-CM | POA: Insufficient documentation

## 2021-02-01 DIAGNOSIS — Z931 Gastrostomy status: Secondary | ICD-10-CM | POA: Insufficient documentation

## 2021-02-01 DIAGNOSIS — G893 Neoplasm related pain (acute) (chronic): Secondary | ICD-10-CM | POA: Insufficient documentation

## 2021-02-01 DIAGNOSIS — C109 Malignant neoplasm of oropharynx, unspecified: Secondary | ICD-10-CM | POA: Insufficient documentation

## 2021-02-01 DIAGNOSIS — Z803 Family history of malignant neoplasm of breast: Secondary | ICD-10-CM | POA: Insufficient documentation

## 2021-02-01 DIAGNOSIS — Z8052 Family history of malignant neoplasm of bladder: Secondary | ICD-10-CM | POA: Insufficient documentation

## 2021-02-01 DIAGNOSIS — R739 Hyperglycemia, unspecified: Secondary | ICD-10-CM | POA: Insufficient documentation

## 2021-02-01 DIAGNOSIS — Z8042 Family history of malignant neoplasm of prostate: Secondary | ICD-10-CM | POA: Insufficient documentation

## 2021-02-01 DIAGNOSIS — Z808 Family history of malignant neoplasm of other organs or systems: Secondary | ICD-10-CM | POA: Insufficient documentation

## 2021-02-01 DIAGNOSIS — K229 Disease of esophagus, unspecified: Secondary | ICD-10-CM | POA: Insufficient documentation

## 2021-02-01 MED ORDER — SODIUM CHLORIDE 0.9% FLUSH
10.0000 mL | Freq: Once | INTRAVENOUS | Status: DC
  Filled 2021-02-01: qty 10

## 2021-02-01 MED ORDER — HEPARIN SOD (PORK) LOCK FLUSH 100 UNIT/ML IV SOLN
500.0000 [IU] | Freq: Once | INTRAVENOUS | Status: DC
  Filled 2021-02-01: qty 5

## 2021-02-01 NOTE — Progress Notes (Signed)
Patient in clinic for port flush and labs. Patient needs to be fasting for hemoglobin A1C, patient scheduled for labs and MD on 3/3. Patient to return to clinic and have port flush and labs drawn then. Patient verbalized understanding.

## 2021-02-01 NOTE — Progress Notes (Signed)
Virtual Visit via Telephone Note  I connected with Ivan Garcia. on 02/01/21 at 10:30 AM EST by telephone and verified that I am speaking with the correct person using two identifiers.  Location: Patient: Home Provider: Clinic   I discussed the limitations, risks, security and privacy concerns of performing an evaluation and management service by telephone and the availability of in person appointments. I also discussed with the patient that there may be a patient responsible charge related to this service. The patient expressed understanding and agreed to proceed.   History of Present Illness: Ivan Garcia. is a 65 y.o. 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  Most recently on [place].    Is status post PEG.  Patient is referred to palliative care to help address goals and manage ongoing symptoms.   Observations/Objective: Patient reports that he is doing well.  He denies any significant changes or concerns today.  He continues to have occasional leakage around his PEG site but says that it has diminished with application of dressing/tape around the site.  He denies any excoriation, erythema, or warmth.  Patient says that his feedings are going well.  He does have occasional reflux but it is associated with him also consuming oral foods.  He is on daily omeprazole.  Patient is followed by nutritionist.  He denies pain or other distressing symptoms.  No issues with medications nor need for refills.  Patient's goals are aligned with continued treatment.  He mentioned possible desire to pursue clinical trial if he fails to respond to current treatment.  Assessment and Plan: Squamous cell carcinoma of head neck-status post XRT currently on Piqray.  No symptomatic complaints at present.  Patient feels he is doing  reasonably well.  Nutrition -has PEG and is supplementing oral intake with PEG feedings.  He is followed by nutritionist.  Follow Up Instructions: Follow-up MyChart visit 1 to 2 months   I discussed the assessment and treatment plan with the patient. The patient was provided an opportunity to ask questions and all were answered. The patient agreed with the plan and demonstrated an understanding of the instructions.   The patient was advised to call back or seek an in-person evaluation if the symptoms worsen or if the condition fails to improve as anticipated.  I provided 9 minutes of non-face-to-face time during this encounter.   Irean Hong, NP

## 2021-02-02 ENCOUNTER — Inpatient Hospital Stay: Payer: Medicare Other

## 2021-02-02 ENCOUNTER — Inpatient Hospital Stay: Payer: Medicare Other | Admitting: Oncology

## 2021-02-03 ENCOUNTER — Inpatient Hospital Stay: Payer: Medicare Other

## 2021-02-03 ENCOUNTER — Encounter: Payer: Self-pay | Admitting: Oncology

## 2021-02-03 ENCOUNTER — Inpatient Hospital Stay (HOSPITAL_BASED_OUTPATIENT_CLINIC_OR_DEPARTMENT_OTHER): Payer: Medicare Other | Admitting: Oncology

## 2021-02-03 VITALS — BP 111/81 | HR 96 | Temp 96.9°F | Wt 185.2 lb

## 2021-02-03 DIAGNOSIS — R1310 Dysphagia, unspecified: Secondary | ICD-10-CM | POA: Diagnosis not present

## 2021-02-03 DIAGNOSIS — C109 Malignant neoplasm of oropharynx, unspecified: Secondary | ICD-10-CM

## 2021-02-03 DIAGNOSIS — C7800 Secondary malignant neoplasm of unspecified lung: Secondary | ICD-10-CM | POA: Diagnosis not present

## 2021-02-03 DIAGNOSIS — Z8042 Family history of malignant neoplasm of prostate: Secondary | ICD-10-CM | POA: Diagnosis not present

## 2021-02-03 DIAGNOSIS — Z95828 Presence of other vascular implants and grafts: Secondary | ICD-10-CM

## 2021-02-03 DIAGNOSIS — R739 Hyperglycemia, unspecified: Secondary | ICD-10-CM | POA: Diagnosis not present

## 2021-02-03 DIAGNOSIS — Z803 Family history of malignant neoplasm of breast: Secondary | ICD-10-CM | POA: Diagnosis not present

## 2021-02-03 DIAGNOSIS — Z452 Encounter for adjustment and management of vascular access device: Secondary | ICD-10-CM | POA: Diagnosis not present

## 2021-02-03 DIAGNOSIS — Z5111 Encounter for antineoplastic chemotherapy: Secondary | ICD-10-CM | POA: Diagnosis not present

## 2021-02-03 DIAGNOSIS — C770 Secondary and unspecified malignant neoplasm of lymph nodes of head, face and neck: Secondary | ICD-10-CM | POA: Diagnosis not present

## 2021-02-03 DIAGNOSIS — G893 Neoplasm related pain (acute) (chronic): Secondary | ICD-10-CM | POA: Diagnosis not present

## 2021-02-03 DIAGNOSIS — Z79899 Other long term (current) drug therapy: Secondary | ICD-10-CM | POA: Diagnosis not present

## 2021-02-03 DIAGNOSIS — Z808 Family history of malignant neoplasm of other organs or systems: Secondary | ICD-10-CM | POA: Diagnosis not present

## 2021-02-03 DIAGNOSIS — Z8052 Family history of malignant neoplasm of bladder: Secondary | ICD-10-CM | POA: Diagnosis not present

## 2021-02-03 DIAGNOSIS — Z931 Gastrostomy status: Secondary | ICD-10-CM | POA: Diagnosis not present

## 2021-02-03 DIAGNOSIS — K229 Disease of esophagus, unspecified: Secondary | ICD-10-CM | POA: Diagnosis not present

## 2021-02-03 LAB — COMPREHENSIVE METABOLIC PANEL
ALT: 20 U/L (ref 0–44)
AST: 17 U/L (ref 15–41)
Albumin: 4.1 g/dL (ref 3.5–5.0)
Alkaline Phosphatase: 81 U/L (ref 38–126)
Anion gap: 11 (ref 5–15)
BUN: 24 mg/dL — ABNORMAL HIGH (ref 8–23)
CO2: 29 mmol/L (ref 22–32)
Calcium: 9.4 mg/dL (ref 8.9–10.3)
Chloride: 100 mmol/L (ref 98–111)
Creatinine, Ser: 1.26 mg/dL — ABNORMAL HIGH (ref 0.61–1.24)
GFR, Estimated: >60 mL/min (ref >60)
Glucose, Bld: 106 mg/dL — ABNORMAL HIGH (ref 70–99)
Potassium: 4.4 mmol/L (ref 3.5–5.1)
Sodium: 140 mmol/L (ref 135–145)
Total Bilirubin: 0.9 mg/dL (ref 0.3–1.2)
Total Protein: 7.4 g/dL (ref 6.5–8.1)

## 2021-02-03 LAB — CBC WITH DIFFERENTIAL/PLATELET
Abs Immature Granulocytes: 0.02 10*3/uL (ref 0.00–0.07)
Basophils Absolute: 0.1 10*3/uL (ref 0.0–0.1)
Basophils Relative: 1 %
Eosinophils Absolute: 0.3 10*3/uL (ref 0.0–0.5)
Eosinophils Relative: 4 %
HCT: 46.5 % (ref 39.0–52.0)
Hemoglobin: 15.5 g/dL (ref 13.0–17.0)
Immature Granulocytes: 0 %
Lymphocytes Relative: 4 %
Lymphs Abs: 0.3 10*3/uL — ABNORMAL LOW (ref 0.7–4.0)
MCH: 30.4 pg (ref 26.0–34.0)
MCHC: 33.3 g/dL (ref 30.0–36.0)
MCV: 91.2 fL (ref 80.0–100.0)
Monocytes Absolute: 0.6 10*3/uL (ref 0.1–1.0)
Monocytes Relative: 7 %
Neutro Abs: 6.6 10*3/uL (ref 1.7–7.7)
Neutrophils Relative %: 84 %
Platelets: 245 10*3/uL (ref 150–400)
RBC: 5.1 MIL/uL (ref 4.22–5.81)
RDW: 13.7 % (ref 11.5–15.5)
WBC: 7.9 10*3/uL (ref 4.0–10.5)
nRBC: 0 % (ref 0.0–0.2)

## 2021-02-03 MED ORDER — HEPARIN SOD (PORK) LOCK FLUSH 100 UNIT/ML IV SOLN
500.0000 [IU] | Freq: Once | INTRAVENOUS | Status: AC
  Administered 2021-02-03: 500 [IU] via INTRAVENOUS
  Filled 2021-02-03: qty 5

## 2021-02-03 MED ORDER — SODIUM CHLORIDE 0.9% FLUSH
10.0000 mL | Freq: Once | INTRAVENOUS | Status: AC
  Administered 2021-02-03: 10 mL via INTRAVENOUS
  Filled 2021-02-03: qty 10

## 2021-02-03 NOTE — Progress Notes (Signed)
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  S/p PEG placement on 10/22.  Patient has completed radiation and started piqray.  Spoke with patient following MD visit.  Patient reports that swallowing is no better. Snacks on foods (ie potato chips, soups, yogurt), unable to swallow meats.  Patient is taking 4-5 cartons of [employer] 1.4 via feeding tube daily.  Reports leakage around PEG site but that it is better.  Patient denies redness, irritation at site.   Denies any problems with feeding.    Medications: reviewed  Labs: glucose 106, BUN 24, creatinine 1.26, hgb a1c 6.1  Anthropometrics:   Weight 185 lb 3.2 oz today increased from 182 lb on 1/18 179 lb on 12/13   Estimated Energy Needs  Kcals: 2100-2500 Protein: 105-125 g Fluid: > 2.1 L  NUTRITION DIAGNOSIS: Inadequate oral intake but relying on feeding tube   INTERVENTION:  Continue [employer] 1.4, 4-5 cartons per day. Flush with 67ml before and 117ml after.  Can give or drink additional 1-2 cups water daily for better hydration.  Eat orally as able.  Discussed importance of modifying consistencies of foods for ease of swallowing    MONITORING, EVALUATION, GOAL: weight trends, tube feeding labs   NEXT VISIT: March 31 after MD appt  Mikaelyn Arthurs [name], [place], [place] Registered Dietitian [phone] (mobile)

## 2021-02-03 NOTE — Progress Notes (Signed)
Hematology/Oncology  Follow Up note [facility] [phone] [phone]   Patient Care Team: Albina Billet, MD as PCP - General (Internal Medicine) Albina Billet, MD (Internal Medicine) Bary Castilla, Forest Gleason, MD (General Surgery) Noreene Filbert, MD as Referring Physician (Radiation Oncology) Earlie Server, MD as Consulting Physician (Oncology)  CHIEF COMPLAINTS/PURPOSE OF CONSULTATION:  Follow up for head and neck cancer.  HISTORY OF PRESENTING ILLNESS:  Ivan Blankley. is a  65 y.o.  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 P16 positive. cT2 cN2 disease, stage II, # Concurrent ChemoRT  Cisplatin 100 mg/m2 q3weeks with concurrent RT which started on 12/31/2017.  S/p one dose of Cisplatin. Cisplatin discontinued due to nephrotoxicity. Switch to weekly Carboplatin (AUC 2) / Taxol 58m/m2 [ finished May 2019] Patient has finished concurrent chemoradiation in May 2019  # He reports doing well until in July 2020, he started to have hoarseness after singing for 2 to 3 hours. He has changed his insurance and Dr. MTami Ribasis no longer covered by his current insurance.  So he establish care with Dr.[place]ella [place]clan [place]. Flexible laryngoscopy showed Vocal cord paralysis.  Patient was recommended to have a PET scan done for restaging. Patient's primary care provider Dr. THall Busingordered a PET scan for patient which was done on 08/20/2019. 08/20/2019 PET scan showed asymmetric hypermetabolic him involving the left vocal cords with hypermetabolic adenopathy in the neck, and the chest.  He also has hypermetabolic pulmonary nodule along the minor fissure which is felt to be metastatic. Tiny proximal left ureteral stone without hydronephrosis.  Possible gallbladder sludge.  Enlarged prostate.   Atherosclerosis.  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  # 09/08/2019 Omniseq NGS showed PD-L1 CPS 20, TMB indeterminant, MSI stable,PIK3CA E542K, E545K  Started on chemotherapy with 6 cycles of carboplatin, 5-FU and Keytruda, finished in February 2021.  And has been on Keytruda maintenance. Initially he had a good response, however progressed whiled on Keytruda maintenance.  PET 04/26/2020 worsening of disease in the chest.  Interval enlargement of the right thoracic inlet and paratracheal lymph nodes that were previously enlarged with new activity in the right hilum corresponding to a lymph node in this location.  Also increased activity in the right upper lobe nodule. Right level 3 lymph node in the neck and anterior mediastinal nodal activity is diminished.  These areas are quite small on prior study. Diminished symmetric uptake within the left as compared to the right vocal cord. Right mandible increased activity in the area of third molar.  This is consistent with his recent tooth infection.  # [facility] [place]  [facility].  Patient was offered for clinical trial with pembrolizumab/lenvatinib versus standard therapy. Patient received additional 1 cycle of Keytruda while waiting to be enrolled to the trial.   #Disease progression.  [facility] right upper paratracheal node is concerning for metastatic disease and a new from 01/20/2020 PET/CT.  The node closely abuts and possibly invade the posterolateral right tracheal wall as well as the esophagus.  Right vocal cord paralysis.  Symmetric soft tissue density at the right base of tongue could reflect residual/recurrent primary  malignancy versus posttreatment changes.  Right supraclavicular lymph node is no longer appreciated  with post treatment changes.  There is increased size of right minor fissure nodule which was metabolically avid on previous PET scan.  New mediastinal and hilar lymphadenopathy concerning for new nodal disease.  No evidence of metastatic disease below the diaphragm.  Nonobstructing 3 mm distal ureter stone.  07/07/2020 started on palliative chemotherapy with docetaxel 09/02/2020 3 cycles of docetaxel  with G-CSF support.    CT scan done at [facility] which unfortunately showed progression of disease. Images are not available for me to review.  CT was compared to June 24, 2020 CT done at [facility]. 09/02/2020,chest without contrast with 3D MIPS protocol showed Interval increase in the size of the right upper lobe perifissural nodule 1.6 x 1.5 cm-previously 1.4 x 1.2 cm tracheoesophageal soft tissue mass 3.4 x 5.0 cm with regional mass-effect on the esophagus-previously 2.4 x 3.6 cm, and mediastinal lymphadenopathy-right upper paratracheal lymph node 1.9 cm-previously 1.5 cm, suspicious for progressive metastatic disease. Subcentimeter lung nodules are stable. CT neck with contrast showed slight interval increase in size of upper right paratracheal nodal conglomerate.  Questionable soft tissue invasion of the posterior tracheal appears worsened.  Compression with potential invasion of the esophagus is again noted.  Prior vocal cord paralysis again noted.  Unchanged right tongue base asymmetry. I had a discussion with patient's [facility] oncologist Dr. Barrington Ellison over the phone. Dr.Choe recommends adding carboplatin AUC 5 to docetaxel regimen for now which hopefully to help to reduce the size of the tracheoesophageal soft tissue mass.  Also recommend palliative radiation. 09/07/2020, carboplatin AUC 5+ docetaxel S/p PEG tube placement on 09/23/2020 #09/28/2020, started on palliative radiation to esophageal  #Patient establish care with [facility] Dr. Maxie Better   09/29/2020  guardant 360 which showed PIK3CA E545K, PIK3CA  amplification. MSI stable, APC [id number] VUS , APC [id number], FGFR2 R573 VUS   . 10/25/20 patient finished palliative radiation to esophageal mass. 11/09/2020, patient had a PET scan which showed partial response Right thoracic inlet Hypermetabolic soft tissue metastasis, 3.9 x 2.6cm, decreased in size, decreased size of  Hypermetabolic right paratracheal 1.2cm, and right hilar nodal Metastases since outside 09/02/2020 neck CT Right upper lobe lung nodule 1.2cm, stable size.  Dr. Baruch Gouty recommend additional radiation. 11/23/20-12/17/19 patient finished additional radiation  INTERVAL HISTORY Ivan Garcia. is a 65 y.o. male who presents for follow-up for metastatic squamous cell carcinoma, P 16+.   He has started on Piqray $RemoveB'300mg'xABblOuq$  daily. Tolerates well. No rash.  Dysphagia, stable. He is on PEG tube feeding, gained 2pounds since last visit.  Dysphagia has not significantly improved.  Symptoms are stable.  Patient is able to take soft food orally for pleasure. No fever, chills, shortness of breath.  Review of Systems  Constitutional: Negative for chills, fever, malaise/fatigue and weight loss.  HENT: Negative for sore throat.        Dysphagia  Eyes: Negative for redness.  Respiratory: Negative for cough, shortness of breath and wheezing.   Cardiovascular: Negative for chest pain, palpitations and leg swelling.  Gastrointestinal: Negative for abdominal pain, blood in stool, nausea and vomiting.       PEG tube placement  Genitourinary: Negative for dysuria.  Musculoskeletal: Negative for myalgias.  Skin: Negative for rash.  Neurological: Negative for dizziness, tingling and tremors.  Endo/Heme/Allergies: Does not bruise/bleed easily.  Psychiatric/Behavioral: Negative for hallucinations.    MEDICAL HISTORY:  Past Medical History:  Diagnosis Date  . Arthritis   .  Cancer ([place])    Head and neck cancer  . Hemorrhoids   . Hyperlipidemia   . Hypertension     SURGICAL HISTORY: Past  Surgical History:  Procedure Laterality Date  . COLONOSCOPY WITH PROPOFOL N/A 04/04/2017   Procedure: COLONOSCOPY WITH PROPOFOL;  Surgeon: Robert Bellow, MD;  Location[facility] ENDOSCOPY;  Service: Endoscopy;  Laterality: N/A;  . IR GASTROSTOMY TUBE MOD SED  09/24/2020  . MANDIBLE SURGERY    . PORT-A-CATH REMOVAL  07/17/2018  . PORTA CATH INSERTION N/A 12/19/2017   Procedure: PORTA CATH INSERTION;  Surgeon: Algernon Huxley, MD;  Location: [place] CV LAB;  Service: Cardiovascular;  Laterality: N/A;  . PORTA CATH INSERTION N/A 07/17/2018   Procedure: PORTA CATH INSERTION;  Surgeon: Algernon Huxley, MD;  Location: [place] CV LAB;  Service: Cardiovascular;  Laterality: N/A;  . PORTA CATH INSERTION N/A 09/18/2019   Procedure: PORTA CATH INSERTION;  Surgeon: Algernon Huxley, MD;  Location: [place] CV LAB;  Service: Cardiovascular;  Laterality: N/A;  . TONSILLECTOMY      SOCIAL HISTORY: Social History   Socioeconomic History  . Marital status: Single    Spouse name: Not on file  . Number of children: Not on file  . Years of education: Not on file  . Highest education level: Not on file  Occupational History  . Not on file  Tobacco Use  . Smoking status: Never Smoker  . Smokeless tobacco: Never Used  Vaping Use  . Vaping Use: Never used  Substance and Sexual Activity  . Alcohol use: No  . Drug use: No  . Sexual activity: Not on file  Other Topics Concern  . Not on file  Social History Narrative  . Not on file   Social Determinants of Health   Financial Resource Strain: Not on file  Food Insecurity: Not on file  Transportation Needs: Not on file  Physical Activity: Not on file  Stress: Not on file  Social Connections: Not on file  Intimate Partner Violence: Not on file    FAMILY HISTORY: Family History  Problem Relation Age of Onset  . Breast cancer Mother   . Asthma Mother   . Congestive Heart Failure Mother   . Prostate cancer Father   . Brain cancer Father    . Bladder Cancer Father     ALLERGIES:  has No Known Allergies.  MEDICATIONS:  Current Outpatient Medications  Medication Sig Dispense Refill  . Alpelisib (PIQRAY, 300 MG DAILY DOSE, PO) Take 300 mg by mouth daily.    Marland Kitchen amLODipine (NORVASC) 5 MG tablet Take 5 mg by mouth daily.    Marland Kitchen atorvastatin (LIPITOR) 20 MG tablet SMARTSIG:1 Tablet(s) By Mouth Every Evening    . lidocaine-prilocaine (EMLA) cream Apply to affected area once 30 g 3  . morphine (ROXANOL) 20 MG/ML concentrated solution Take 1 mL (20 mg total) by mouth every 4 (four) hours as needed for moderate pain or severe pain. 30 mL 0  . nystatin (MYCOSTATIN) 100000 UNIT/ML suspension Take 5 mLs (500,000 Units total) by mouth in the morning, at noon, and at bedtime. SWISH AND SPIT 473 mL 0  . omeprazole (PRILOSEC) 20 MG capsule Take 1 capsule (20 mg total) by mouth daily. 90 capsule 1  . ondansetron (ZOFRAN) 8 MG tablet Take 1 tablet (8 mg total) by mouth 2 (two) times daily as needed for refractory nausea / vomiting. Start on day 3 after carboplatin chemo. 30 tablet 1  . prochlorperazine (  COMPAZINE) 10 MG tablet Take 1 tablet (10 mg total) by mouth every 6 (six) hours as needed (Nausea or vomiting). 30 tablet 1   No current facility-administered medications for this visit.   Facility-Administered Medications Ordered in Other Visits  Medication Dose Route Frequency Provider Last Rate Last Admin  . sodium chloride flush (NS) 0.9 % injection 10 mL  10 mL Intravenous PRN Earlie Server, MD      . sodium chloride flush (NS) 0.9 % injection 10 mL  10 mL Intravenous PRN Earlie Server, MD   10 mL at 10/13/20 1034     PHYSICAL EXAMINATION: ECOG PERFORMANCE STATUS: 1 - Symptomatic but completely ambulatory Vitals:   02/03/21 0954  BP: 111/81  Pulse: 96  Temp: (!) 96.9 F (36.1 C)  SpO2: 100%   Physical Exam Constitutional:      General: He is not in acute distress.    Appearance: He is not diaphoretic.  HENT:     Head: Normocephalic and  atraumatic.     Nose: Nose normal.     Mouth/Throat:     Pharynx: No oropharyngeal exudate.  Eyes:     General: No scleral icterus.    Pupils: Pupils are equal, round, and reactive to light.  Neck:     Comments: Right cervical lymphadenopathy Cardiovascular:     Rate and Rhythm: Normal rate and regular rhythm.     Heart sounds: No murmur heard.   Pulmonary:     Effort: Pulmonary effort is normal. No respiratory distress.     Breath sounds: No rales.  Chest:     Chest wall: No tenderness.  Abdominal:     General: There is no distension.     Palpations: Abdomen is soft.     Tenderness: There is no abdominal tenderness.  Musculoskeletal:        General: Normal range of motion.     Cervical back: Normal range of motion and neck supple.  Skin:    General: Skin is warm and dry.     Findings: No erythema.  Neurological:     Mental Status: He is alert and oriented to person, place, and time.     Cranial Nerves: No cranial nerve deficit.     Motor: No abnormal muscle tone.     Coordination: Coordination normal.  Psychiatric:        Mood and Affect: Affect normal.      LABORATORY DATA:  I have reviewed the data as listed Lab Results  Component Value Date   WBC 7.9 02/03/2021   HGB 15.5 02/03/2021   HCT 46.5 02/03/2021   MCV 91.2 02/03/2021   PLT 245 02/03/2021   Recent Labs    08/17/20 0804 08/23/20 1414 08/31/20 1244 09/07/20 0833 01/06/21 1025 01/20/21 0901 02/03/21 0928  NA 139 137 137   < > 136 140 140  K 4.0 3.6 4.5   < > 4.7 4.2 4.4  CL 105 100 98   < > 99 100 100  CO2 $Re'25 26 27   'zhS$ < > $R'28 29 29  'DK$ GLUCOSE 101* 129* 134*   < > 202* 90 106*  BUN 23 21 25*   < > 26* 27* 24*  CREATININE 1.52* 1.50* 1.64*   < > 1.22 1.35* 1.26*  CALCIUM 9.2 8.9 9.3   < > 9.1 9.4 9.4  GFRNONAA 47* 48* 43*   < > >60 58* >60  GFRAA 55* 56* 50*  --   --   --   --  PROT 7.4 7.0 7.3   < > 7.3 7.6 7.4  ALBUMIN 3.5 3.5 3.5   < > 3.7 4.0 4.1  AST $Re'19 18 20   'NZS$ < > $R'17 16 17  'PJ$ ALT $'16 16  17   'W$ < > $R'19 19 20  'Yv$ ALKPHOS 82 93 103   < > 67 73 81  BILITOT 0.7 0.8 0.6   < > 0.5 0.8 0.9   < > = values in this interval not displayed.  RADIOGRAPHIC STUDIES: I have personally reviewed the radiological images as listed and agreed with the findings in the report. CT SOFT TISSUE NECK W CONTRAST  Result Date: 01/05/2021 CLINICAL DATA:  Squamous cell carcinoma of oropharynx, multiple recurrences and treatments, follow-up EXAM: CT NECK WITH CONTRAST TECHNIQUE: Multidetector CT imaging of the neck was performed using the standard protocol following the bolus administration of intravenous contrast. CONTRAST:  33mL OMNIPAQUE IOHEXOL 300 MG/ML  SOLN COMPARISON:  PET-CT 11/10/2020 FINDINGS: Pharynx and larynx: Post radiation changes are present. Medialization of the right vocal cord. No evidence of local recurrent disease. Salivary glands: Post radiation changes.  Otherwise unremarkable. Thyroid: Unremarkable. Lymph nodes: Confluent soft tissue is again identified near the thoracic inlet along the right aspect of the esophagus and posterior to the right lobe of the thyroid. Margins are poorly delineated and therefore measurement is difficult, but there is no progression since the 11/09/2020 PET-CT. No new adenopathy identified. Vascular: Major neck vessels are patent. Limited intracranial: No abnormal enhancement. Visualized orbits: Unremarkable. Mastoids and visualized paranasal sinuses: Minor paranasal sinus mucosal thickening. Mastoid air cells are clear. Skeleton: Cervical spine degenerative changes. Upper chest: Dictated separately. Other: None. IMPRESSION: No progression of right paraesophageal metastatic nodal conglomerate since 11/10/2020 PET-CT. Comparison is difficult and this lesion is either stable or decreased in size. No new adenopathy in the neck. Stable post treatment appearance of the pharynx and larynx. Electronically Signed   By: Macy Mis M.D.   On: 01/05/2021 10:57   CT Chest W  Contrast  Result Date: 01/05/2021 CLINICAL DATA:  Restaging squamous cell carcinoma the oropharynx and chest. EXAM: CT CHEST WITH CONTRAST TECHNIQUE: Multidetector CT imaging of the chest was performed during intravenous contrast administration. CONTRAST:  67mL OMNIPAQUE IOHEXOL 300 MG/ML  SOLN COMPARISON:  PET-CT 11/09/2020 and CT scan 09/07/2020 FINDINGS: Cardiovascular: The heart is normal in size. No pericardial effusion. The aorta is normal in caliber. No dissection. Scattered atherosclerotic calcifications. Stable coronary artery calcifications. The pulmonary arteries are unremarkable. Mediastinum/Nodes: Persistent soft tissue density the involving the thoracic inlet region posterior to the cricoid cartilage and extending down into the upper mediastinum. This measures a maximum 3.7 x 2.0 cm. On the prior CT scan it measured 5.5 x 4.0 cm and now appears less solid. When compared to the prior CT scan from 09/02/2020 the right paratracheal nodal mass has significantly decreased in size. It measures 6.5 mm on image number 17/4 and previously measured 17 mm. Right hilar node on image number 29/for measures 11 mm and previously measured 17 mm. Precarinal lymph node on image number 26/4 measures 5 mm and previously measured 8 mm. No new or progressive findings. The esophagus is grossly normal. Lungs/Pleura: 10.5 x 10.5 mm right upper lobe pulmonary nodule on image number 88/6 previously measured 15 x 14 mm. Other areas of scattered ground-glass opacity in the right lung are new and likely inflammatory. Some of these were present on the prior PET-CT from December. Mild bronchiectasis and patchy ground-glass opacity in the  right middle lobe appears slightly improved when compared to the prior PET-CT. No pleural effusions or pleural nodules. Upper Abdomen: No significant upper abdominal findings. Stable left hepatic lobe cyst. A feeding gastrostomy tube is noted. No adrenal gland lesions. Musculoskeletal: No  significant bony findings. IMPRESSION: 1. Interval regression of thoracic inlet lesion, mediastinal and right hilar adenopathy and right upper lobe pulmonary nodule. No new or progressive findings. 2. Patchy ground-glass opacities in the right lung are likely inflammatory. 8 mm sub solid right upper lobe nodule will require surveillance. 3. No findings for upper abdominal metastatic disease or osseous metastatic disease. Aortic Atherosclerosis (ICD10-I70.0) and Emphysema (ICD10-J43.9). Electronically Signed   By: Marijo Sanes M.D.   On: 01/05/2021 10:41   NM PET Image Restag (PS) Skull Base To Thigh  Result Date: 11/10/2020 CLINICAL DATA:  Subsequent treatment strategy for oropharyngeal squamous cell carcinoma, most recent radiation therapy 2 weeks prior. EXAM: NUCLEAR MEDICINE PET SKULL BASE TO THIGH TECHNIQUE: 9.4 mCi F-18 FDG was injected intravenously. Full-ring PET imaging was performed from the skull base to thigh after the radiotracer. CT data was obtained and used for attenuation correction and anatomic localization. Fasting blood glucose: 78 mg/dl COMPARISON:  04/26/2020 PET-CT. Outside CT neck and chest from 09/02/2020. FINDINGS: Mediastinal blood pool activity: SUV max 2.3 Liver activity: SUV max NA NECK: Right thoracic inlet hypermetabolic 3.9 x 2.6 cm soft tissue mass located posterior to the right thyroid lobe and to the right of esophagus with max SUV 12.0 (series 3/image 67), decreased from 5.2 x 3.5 cm on 09/02/2020 outside neck CT, increased in size from 2.1 x 1.8 cm and similar in uptake with max SUV 12.9 on 04/26/2020 PET-CT. No additional hypermetabolic neck lymph nodes. Incidental CT findings: Right internal jugular Port-A-Cath terminates at the cavoatrial junction. CHEST: Right paratracheal hypermetabolic 1.2 cm lymph node with max SUV 4.0 (series 3/image 81), decreased in size from 1.7 cm on 09/02/2020 outside chest CT, increased in size from 1.0 cm and decreased in uptake with max SUV  6.2 on 04/26/2020 PET-CT. Hypermetabolic right hilar lymph node with max SUV 5.2, previous max SUV 8.3 on 04/26/2020 PET-CT, decreased. No new enlarged or hypermetabolic axillary, mediastinal or hilar lymph nodes. Mildly hypermetabolic solid 1.2 cm right upper lobe pulmonary nodule with max SUV 3.0 (series 3/image 112), previously 1.2 cm with max SUV 4.2, stable in size and mildly decreased in metabolism. Low level nonfocal hypermetabolism associated with new patchy ground-glass opacity in the peripheral right upper lobe (series 3/image 102), presumably inflammatory. No additional hypermetabolic pulmonary findings. Incidental CT findings: Coronary atherosclerosis. Atherosclerotic nonaneurysmal thoracic aorta. Mild centrilobular emphysema. ABDOMEN/PELVIS: No abnormal hypermetabolic activity within the liver, pancreas, adrenal glands, or spleen. No hypermetabolic lymph nodes in the abdomen or pelvis. Incidental CT findings: Simple 1.8 cm left liver cyst. Atherosclerotic nonaneurysmal abdominal aorta. Simple 5.1 cm medial upper right renal cyst. Well-positioned percutaneous gastrostomy tube terminating in the body of the stomach. Mild diffuse colonic diverticulosis. SKELETON: No focal hypermetabolic activity to suggest skeletal metastasis. Incidental CT findings: none IMPRESSION: 1. Hypermetabolic soft tissue metastasis at the right thoracic inlet, decreased in size since outside 09/02/2020 neck CT, increased in size and similar in uptake compared to 04/26/2020 PET-CT. 2. Hypermetabolic right paratracheal and right hilar nodal metastases, decreased in size from outside 09/02/2020 chest CT, decreased in FDG uptake since 04/26/2020 PET-CT. 3. Mildly hypermetabolic right upper lobe solid pulmonary nodule, stable in size and mildly decreased in metabolism since 04/26/2020 PET-CT. 4. No new or progressive  hypermetabolic metastatic disease. 5. Chronic findings include: Aortic Atherosclerosis (ICD10-I70.0) and Emphysema  (ICD10-J43.9). Coronary atherosclerosis. Mild diffuse colonic diverticulosis. Electronically Signed   By: Ilona Sorrel M.D.   On: 11/10/2020 10:20     ASSESSMENT & PLAN:   1. 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[place])   2. Dysphagia, unspecified type   3. Encounter for antineoplastic chemotherapy   4. Port-A-Cath in place    #Metastatic squamous cell carcinoma of oropharynx- cervical lymphadenopathy, lung metastatic disease, local invasion of esophagus. Patient finished palliative radiation to cervical lymphadenopathy/peri-esophageal mass. 01/05/21 CT chest w contrast was reviewed and discussed with patient.  Partial response.  Continue Piqray $RemoveBeforeD'300mg'[id number]$  daily- off label use and labs are reviewed and discussed with patient.  Overall he tolerates well.  Continue. Continue Zyrtec for rash prophylaxis.  Plan to repeat CT in late April/early May.  #Hyperglycemia.  Likely secondary to  [facility].  Continue to follow.  A1c is pending.  Glucose level has improved today. # Dysphagia, s/p PEG tube placement.   # Neoplasm associated pain, no pain currently. Symptoms are stable. #Port-A-Cath in place, no blood return today.  We will send patient for dye study.   Follow-up: Follow-up in 4 weeks. Earlie Server, MD, PhD Hematology Oncology [place] [facility] 02/03/2021

## 2021-02-04 ENCOUNTER — Other Ambulatory Visit: Payer: Self-pay

## 2021-02-04 ENCOUNTER — Ambulatory Visit
Admission: RE | Admit: 2021-02-04 | Discharge: 2021-02-04 | Disposition: A | Discharge status: Home / Self Care | Payer: Medicare Other | Source: Ambulatory Visit | Attending: Oncology | Admitting: Oncology

## 2021-02-04 ENCOUNTER — Telehealth: Payer: Self-pay

## 2021-02-04 DIAGNOSIS — C109 Malignant neoplasm of oropharynx, unspecified: Secondary | ICD-10-CM | POA: Diagnosis not present

## 2021-02-04 DIAGNOSIS — Z95828 Presence of other vascular implants and grafts: Secondary | ICD-10-CM | POA: Diagnosis present

## 2021-02-04 LAB — HEMOGLOBIN A1C
Hgb A1c MFr Bld: 7.4 % — ABNORMAL HIGH (ref 4.8–5.6)
Mean Plasma Glucose: 165.68 mg/dL

## 2021-02-04 IMAGING — RF DG FLUORO GUIDE CV LINE
3 series · 9 of 9 positions shown · IV contrast (agent unspecified)
Comparison: none

INDICATION: History of head neck cancer with surgical placement of a port a
catheter on [DATE].

Patient now with difficulty aspirating from the port a catheter and
as such presents for fluoroscopic guided evaluation.
EXAM:
FLUOROSCOPIC GUIDED PORT A CATHETER CHECK
MEDICATIONS:
None.
CONTRAST:  None
FLUOROSCOPY TIME:  12 seconds
COMPLICATIONS:
None immediate.
TECHNIQUE: The procedure, risks, benefits, and alternatives were explained to
the patient and informed written consent was obtained. A timeout was
performed prior to the initiation of the procedure.

[Series 1: cp_standard · 0.25mm/px · 1 of 1 slices shown (1 of 3)]
[im 1/1]
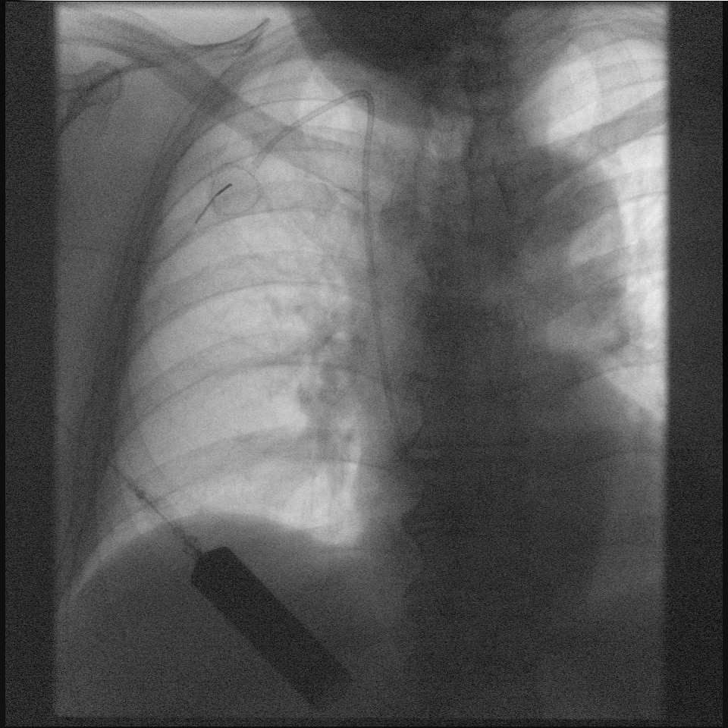

[Series 2: cp_standard · 0.25mm/px · 4 of 54 frames shown (2 of 3)]
[frame 8/54]
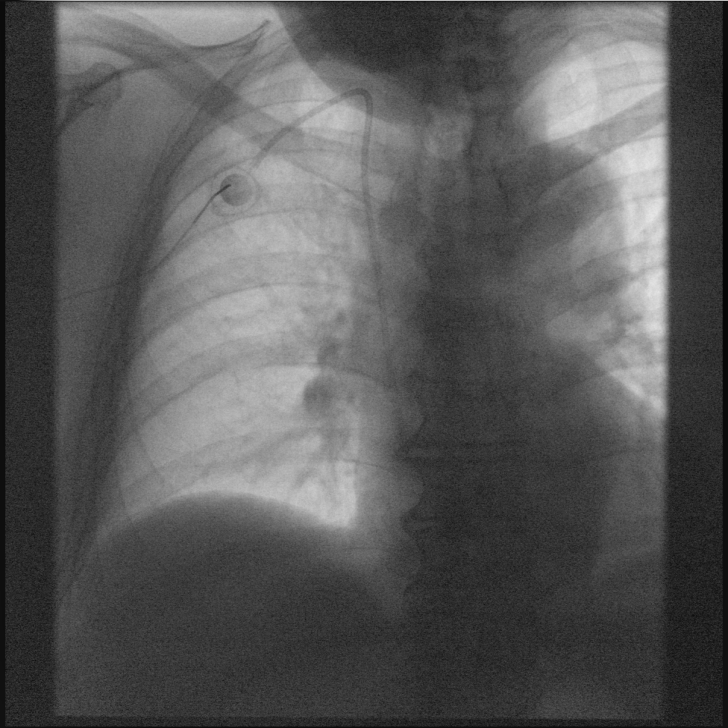
[frame 9/54]
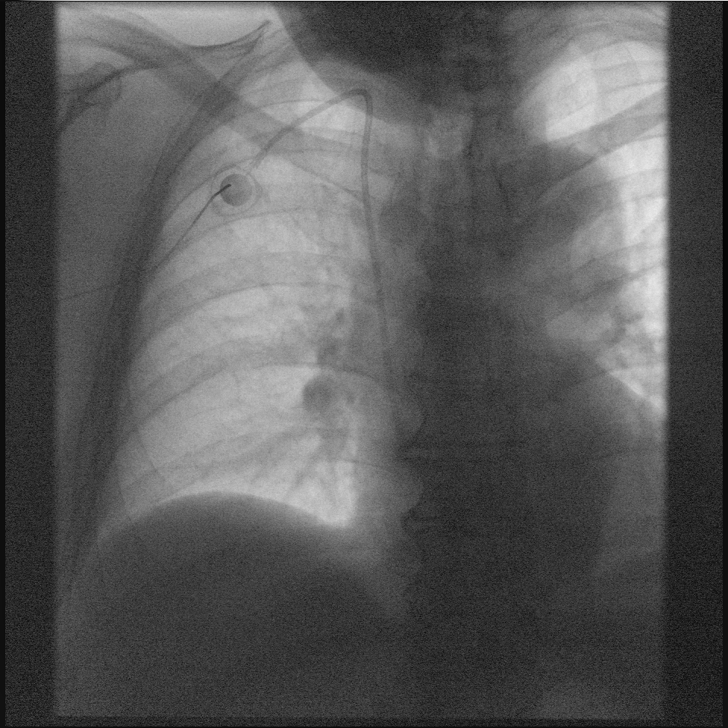
[frame 28/54]
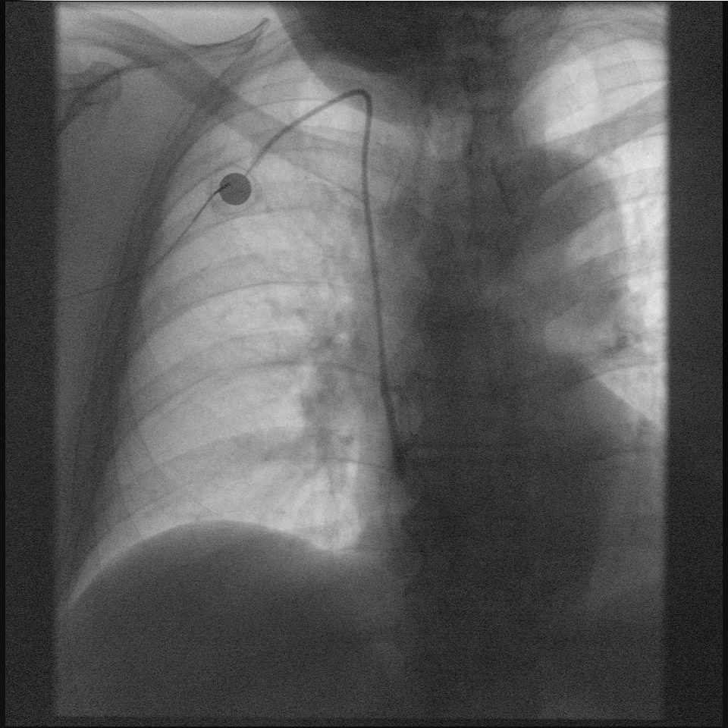
[frame 46/54]
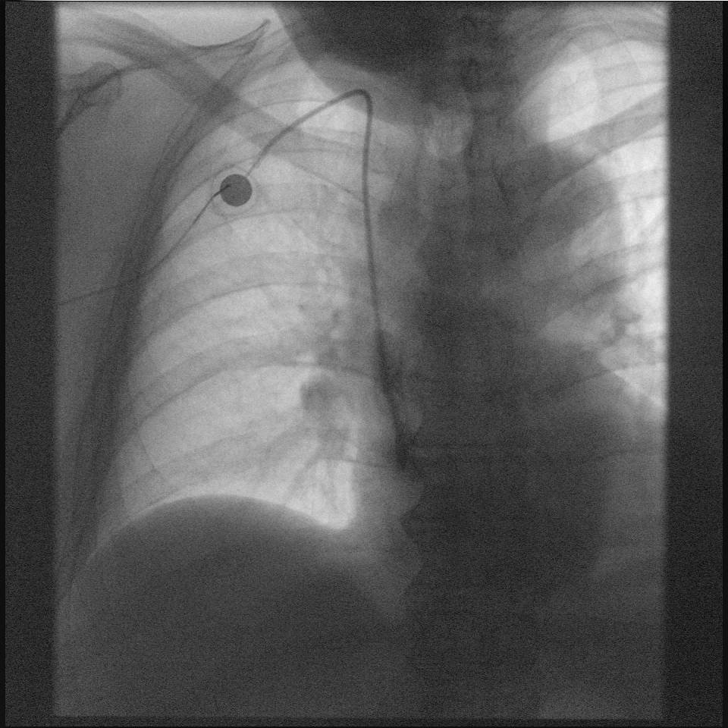

[Series 3: cp_standard · 0.17mm/px · 4 of 40 frames shown (3 of 3)]
[frame 5/40]
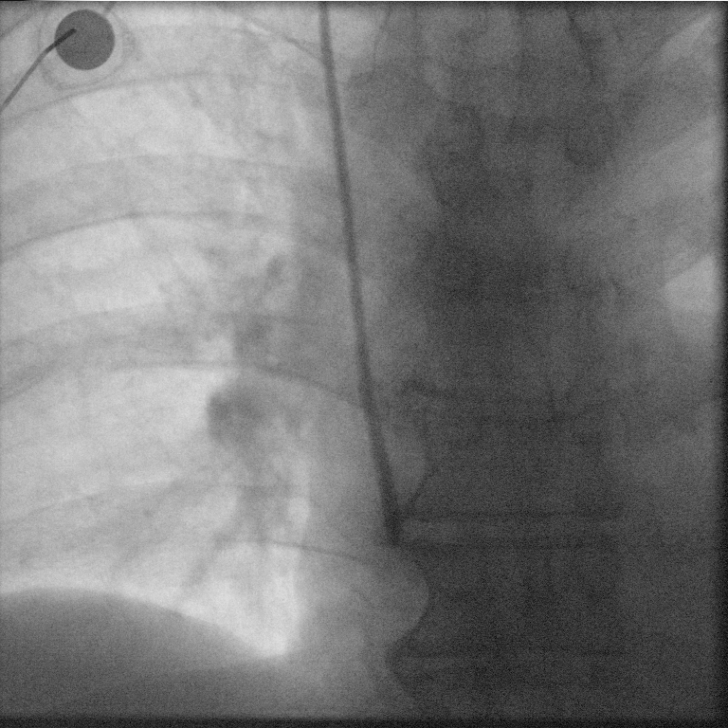
[frame 7/40]
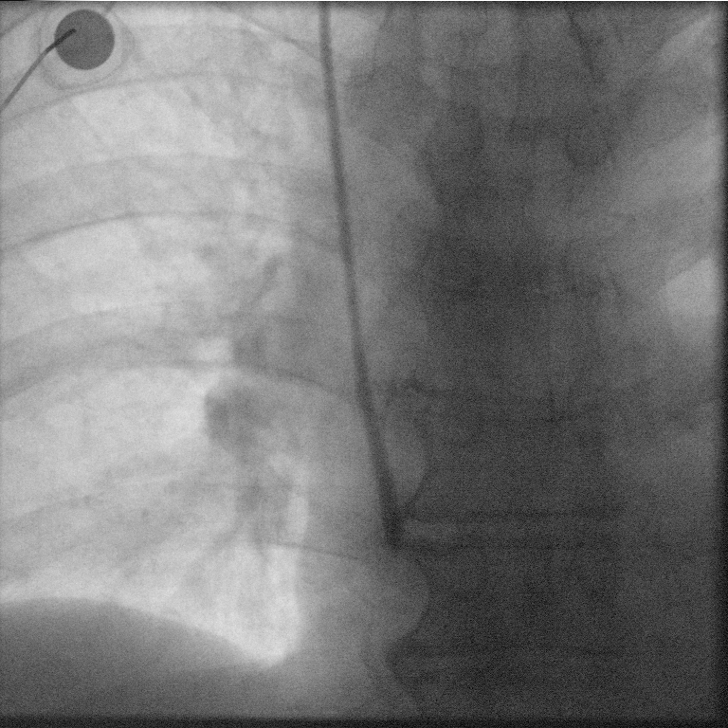
[frame 21/40]
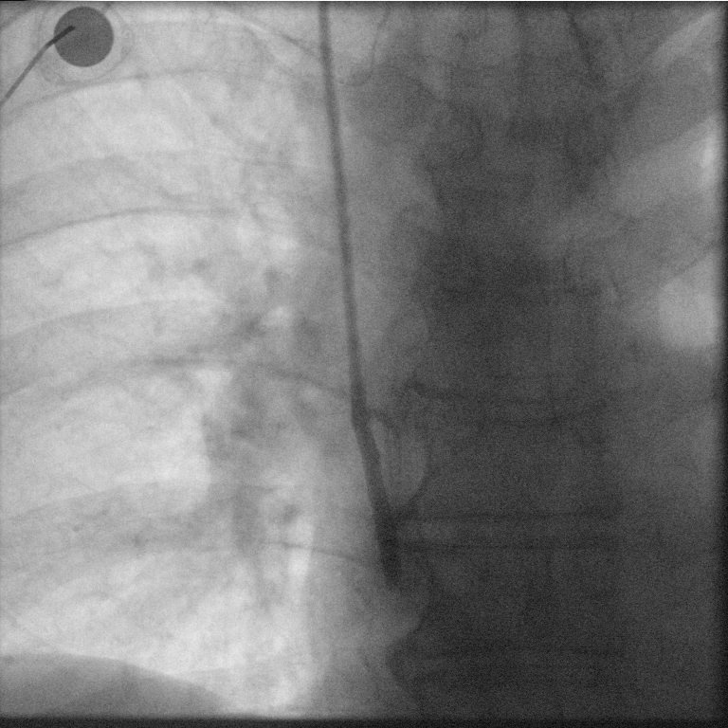
[frame 35/40]
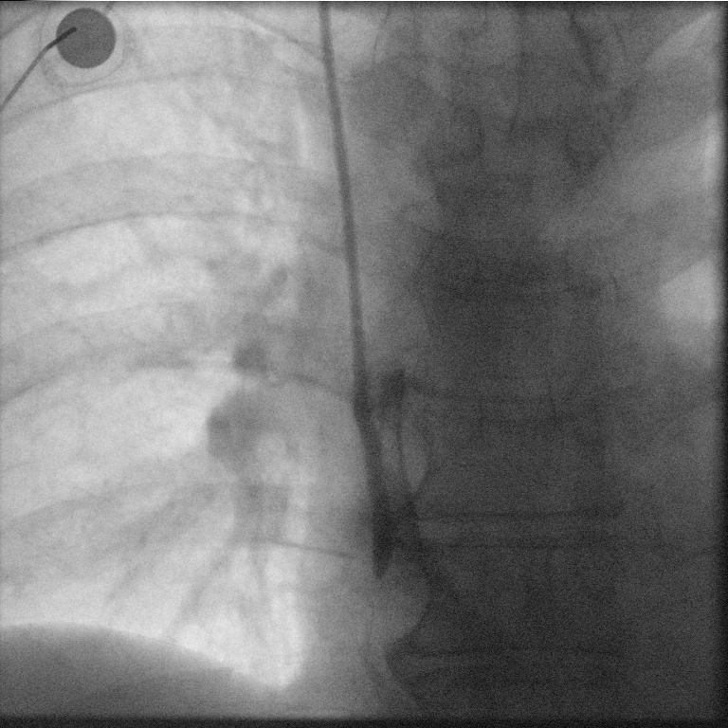

[9 of 9 positions shown; findings below may reference images not displayed]

The patient's chest port a catheter was accessed by the IV team.

The patient was placed supine on the fluoroscopy table. A
preprocedural spot fluoroscopic image was obtained of the chest in
existing port a catheter.

Note was made of difficulty aspirating blood from the port a
catheter.

Contrast was injected via the Port a catheter and images were
reviewed.

The Port a catheter was flushed with a heparin dwell and de
accessed. A dressing was placed. The patient tolerated the procedure
well without immediate postprocedural complication.
FINDINGS: Unchanged positioning of anterior chest wall jugular vein approach
port a catheter with tip projected over the expected location of the
superior cavoatrial junction.

There was difficulty aspirating blood from the port a catheter.

Contrast injection demonstrated the presence of a fibrin sheath
about the distal end of the Port a catheter which was located within
the mid aspect of the SVC. There is no evidence of catheter kink or
fracture. No contrast extravasation.
IMPRESSION: Difficulty aspirating of the Port a catheter is secondary to the
presence of a fibrin sheath.

Above findings were discussed with referring oncologist, Dr. YINNE, and
the following options were provided:

1. Maintain the Port a catheter as is and attempt a tPA dwell
realizing that the port a catheter may remain difficult for blood
draws, but could be used for medication administration.
2. Attempt to perform a fibrin sheath stripping from the port a
catheter tip via a right common femoral vein approach.

Note, given otherwise appropriate positioning and functionality of
the Port a catheter, I feel that a fibrin sheath stripping should be
pursued prior to definitive Port a catheter revision and as such
this was offered at this time.

After discussion of potential treatment options, Dr. YINNE perform
a tPA dwell and if unsuccessful or non durable, will refer for
attempted fibrin sheath stripping.

Above was discussed with the patient who is in agreement with the
proposed plan care.

## 2021-02-04 MED ORDER — PIQRAY (300 MG DAILY DOSE) 2 X 150 MG PO TBPK: ORAL_TABLET | ORAL | 0 refills | Status: DC

## 2021-02-04 MED ORDER — IOHEXOL 300 MG/ML  SOLN
15.0000 mL | Freq: Once | INTRAMUSCULAR | Status: AC | PRN
  Administered 2021-02-04: 15 mL

## 2021-02-04 NOTE — Telephone Encounter (Signed)
Per [place] from Dr. Tasia Catchings: IR recommends Cath flo trial. if not working, then send him to IR for fibrin sheath stripping.- per Dr.Watts.   Pt scheduled for cathflo on Monday 3/7. He was notified of recommendation and appt details.

## 2021-02-04 NOTE — Procedures (Signed)
Pre Procedure Dx: Malfunctioning port Post Procedural Dx: Same  Port injection demonstrates development of a significant fibrin sheath about the port tip. Port is ready to use for medication administration but may prove challenging for aspiration of blood.  Estimated Blood Loss: Minimal Complications: None immediate.  PLAN: - Recommend TPA dwell. - If unsuccessful, recommend fibrin sheath stripping in hopes of preserving the otherwise well positioned port.   Above d/w Dr. Tasia Catchings at the time of procedure completion.   Ronny Bacon, MD Pager #: [phone]

## 2021-02-07 ENCOUNTER — Other Ambulatory Visit: Payer: Self-pay | Admitting: Oncology

## 2021-02-07 ENCOUNTER — Inpatient Hospital Stay: Payer: Medicare Other

## 2021-02-07 ENCOUNTER — Other Ambulatory Visit: Payer: Self-pay

## 2021-02-07 VITALS — BP 123/80 | HR 90 | Temp 97.7°F | Resp 20

## 2021-02-07 DIAGNOSIS — Z95828 Presence of other vascular implants and grafts: Secondary | ICD-10-CM

## 2021-02-07 DIAGNOSIS — C109 Malignant neoplasm of oropharynx, unspecified: Secondary | ICD-10-CM | POA: Diagnosis not present

## 2021-02-07 MED ORDER — SODIUM CHLORIDE 0.9% FLUSH
10.0000 mL | INTRAVENOUS | Status: DC | PRN
  Administered 2021-02-07 (×2): 10 mL via INTRAVENOUS
  Filled 2021-02-07: qty 10

## 2021-02-07 MED ORDER — ALTEPLASE 2 MG IJ SOLR
2.0000 mg | Freq: Once | INTRAMUSCULAR | Status: AC
  Administered 2021-02-07: 2 mg
  Filled 2021-02-07: qty 2

## 2021-02-07 MED ORDER — HEPARIN SOD (PORK) LOCK FLUSH 100 UNIT/ML IV SOLN
500.0000 [IU] | Freq: Once | INTRAVENOUS | Status: AC
  Administered 2021-02-07: 500 [IU] via INTRAVENOUS
  Filled 2021-02-07: qty 5

## 2021-02-07 MED ORDER — HEPARIN SOD (PORK) LOCK FLUSH 100 UNIT/ML IV SOLN
INTRAVENOUS | Status: AC
  Filled 2021-02-07: qty 5

## 2021-02-07 NOTE — Progress Notes (Signed)
3428- Cathflo Activase instillation into port-a-cath per policy.  0957- 30 minutes dwell time of [place] does not yield blood return.   1127- 120 minutes dwell time of Cathflo Activase- Port-a-cath yields blood return. Blood aspirated per policy and then port-a-cath flushed per policy.  1132- Patient stable and discharged to home.

## 2021-02-14 ENCOUNTER — Other Ambulatory Visit: Payer: Self-pay | Admitting: *Deleted

## 2021-02-14 DIAGNOSIS — C109 Malignant neoplasm of oropharynx, unspecified: Secondary | ICD-10-CM

## 2021-02-14 MED ORDER — PIQRAY (300 MG DAILY DOSE) 2 X 150 MG PO TBPK: ORAL_TABLET | ORAL | 0 refills | Status: DC

## 2021-02-15 ENCOUNTER — Other Ambulatory Visit: Payer: Self-pay

## 2021-02-15 ENCOUNTER — Telehealth: Payer: Self-pay

## 2021-02-15 DIAGNOSIS — C109 Malignant neoplasm of oropharynx, unspecified: Secondary | ICD-10-CM

## 2021-02-15 MED ORDER — PIQRAY (300 MG DAILY DOSE) 2 X 150 MG PO TBPK: ORAL_TABLET | ORAL | 3 refills | Status: DC

## 2021-02-15 NOTE — Telephone Encounter (Signed)
New rx with 3 refills has been sent to [facility].

## 2021-02-15 NOTE — Telephone Encounter (Signed)
-----   Message from Earlie Server, MD sent at 02/14/2021 11:35 PM EDT ----- Ok to send for 3 refills.  ----- Message ----- From: Evelina Dun, RN Sent: 02/11/2021   8:38 AM EDT To: Evelina Dun, RN, Earlie Server, MD  Per secure chat from Dennison Nancy, CPhT: Mr Goheen called me this morning and he said that after he receives his refill of [place] next week that he will need another rx.  [employer] asked him to have a prescription sent with multiple refills so there isn't a delay in him receiving his medication.  Do you think Dr Tasia Catchings would send another rx with 3 or 6 refills for him?  are you ok with sending multiple refills?

## 2021-03-03 ENCOUNTER — Inpatient Hospital Stay: Payer: Medicare Other

## 2021-03-03 ENCOUNTER — Inpatient Hospital Stay (HOSPITAL_BASED_OUTPATIENT_CLINIC_OR_DEPARTMENT_OTHER): Payer: Medicare Other | Admitting: Oncology

## 2021-03-03 ENCOUNTER — Encounter: Payer: Self-pay | Admitting: Oncology

## 2021-03-03 VITALS — BP 115/88 | HR 108 | Temp 98.1°F | Resp 18 | Wt 188.1 lb

## 2021-03-03 DIAGNOSIS — Z95828 Presence of other vascular implants and grafts: Secondary | ICD-10-CM | POA: Diagnosis not present

## 2021-03-03 DIAGNOSIS — C109 Malignant neoplasm of oropharynx, unspecified: Secondary | ICD-10-CM

## 2021-03-03 DIAGNOSIS — Z5111 Encounter for antineoplastic chemotherapy: Secondary | ICD-10-CM

## 2021-03-03 DIAGNOSIS — R1310 Dysphagia, unspecified: Secondary | ICD-10-CM | POA: Diagnosis not present

## 2021-03-03 DIAGNOSIS — G620 Drug-induced polyneuropathy: Secondary | ICD-10-CM

## 2021-03-03 DIAGNOSIS — T451X5A Adverse effect of antineoplastic and immunosuppressive drugs, initial encounter: Secondary | ICD-10-CM

## 2021-03-03 LAB — COMPREHENSIVE METABOLIC PANEL
ALT: 19 U/L (ref 0–44)
AST: 18 U/L (ref 15–41)
Albumin: 4 g/dL (ref 3.5–5.0)
Alkaline Phosphatase: 66 U/L (ref 38–126)
Anion gap: 12 (ref 5–15)
BUN: 29 mg/dL — ABNORMAL HIGH (ref 8–23)
CO2: 29 mmol/L (ref 22–32)
Calcium: 9.3 mg/dL (ref 8.9–10.3)
Chloride: 98 mmol/L (ref 98–111)
Creatinine, Ser: 1.27 mg/dL — ABNORMAL HIGH (ref 0.61–1.24)
GFR, Estimated: >60 mL/min (ref >60)
Glucose, Bld: 103 mg/dL — ABNORMAL HIGH (ref 70–99)
Potassium: 4.2 mmol/L (ref 3.5–5.1)
Sodium: 139 mmol/L (ref 135–145)
Total Bilirubin: 0.9 mg/dL (ref 0.3–1.2)
Total Protein: 7.2 g/dL (ref 6.5–8.1)

## 2021-03-03 LAB — CBC WITH DIFFERENTIAL/PLATELET
Abs Immature Granulocytes: 0.04 10*3/uL (ref 0.00–0.07)
Basophils Absolute: 0.1 10*3/uL (ref 0.0–0.1)
Basophils Relative: 1 %
Eosinophils Absolute: 0.2 10*3/uL (ref 0.0–0.5)
Eosinophils Relative: 4 %
HCT: 45.6 % (ref 39.0–52.0)
Hemoglobin: 15 g/dL (ref 13.0–17.0)
Immature Granulocytes: 1 %
Lymphocytes Relative: 6 %
Lymphs Abs: 0.4 10*3/uL — ABNORMAL LOW (ref 0.7–4.0)
MCH: 29.6 pg (ref 26.0–34.0)
MCHC: 32.9 g/dL (ref 30.0–36.0)
MCV: 90.1 fL (ref 80.0–100.0)
Monocytes Absolute: 0.5 10*3/uL (ref 0.1–1.0)
Monocytes Relative: 8 %
Neutro Abs: 5.6 10*3/uL (ref 1.7–7.7)
Neutrophils Relative %: 80 %
Platelets: 288 10*3/uL (ref 150–400)
RBC: 5.06 MIL/uL (ref 4.22–5.81)
RDW: 13.3 % (ref 11.5–15.5)
WBC: 6.9 10*3/uL (ref 4.0–10.5)
nRBC: 0 % (ref 0.0–0.2)

## 2021-03-03 MED ORDER — OMEPRAZOLE 20 MG PO CPDR: 20.0000 mg | DELAYED_RELEASE_CAPSULE | Freq: Every day | ORAL | 1 refills | Status: DC

## 2021-03-03 NOTE — Progress Notes (Signed)
Hematology/Oncology  Follow Up note [facility] Telephone:([phone] Fax:([phone]) [phone]   Patient Care Team: Albina Billet, MD as PCP - General (Internal Medicine) Albina Billet, MD (Internal Medicine) Bary Castilla, Forest Gleason, MD (General Surgery) Noreene Filbert, MD as Referring Physician (Radiation Oncology) Earlie Server, MD as Consulting Physician (Oncology)  CHIEF COMPLAINTS/PURPOSE OF CONSULTATION:  Follow up for head and neck cancer.  HISTORY OF PRESENTING ILLNESS:  [facility]. is a  65 y.o.  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 P16 positive. cT2 cN2 disease, stage II, # Concurrent ChemoRT  Cisplatin 100 mg/m2 q3weeks with concurrent RT which started on 12/31/2017.  S/p one dose of Cisplatin. Cisplatin discontinued due to nephrotoxicity. Switch to weekly Carboplatin (AUC 2) / Taxol 58m/m2 [ finished May 2019] Patient has finished concurrent chemoradiation in May 2019  # He reports doing well until in July 2020, he started to have hoarseness after singing for 2 to 3 hours. He has changed his insurance and Dr. MTami [facility] no longer covered by his current insurance.  So [facility]. Flexible laryngoscopy showed Vocal cord paralysis.  Patient was recommended to have a PET scan done for restaging. Patient's primary care provider Dr. THall Busingordered a PET scan for patient which was done on 08/20/2019. 08/20/2019 PET scan showed asymmetric hypermetabolic him involving Ivan left vocal cords with hypermetabolic adenopathy in Ivan neck, and Ivan chest.  He also has hypermetabolic pulmonary nodule along Ivan minor fissure which is felt to be metastatic. Tiny proximal left ureteral stone without hydronephrosis.  Possible gallbladder sludge.  Enlarged prostate.   Atherosclerosis.  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  # 09/08/2019 Omniseq NGS showed PD-L1 CPS 20, TMB indeterminant, MSI stable,PIK3CA E542K, E545K  Started on chemotherapy with 6 cycles of carboplatin, 5-FU and Keytruda, finished in February 2021.  And has been on Keytruda maintenance. Initially he had a good response, however progressed whiled on Keytruda maintenance.  PET 04/26/2020 worsening of disease in Ivan chest.  Interval enlargement of Ivan right thoracic inlet and paratracheal lymph nodes that were previously enlarged with new activity in Ivan right hilum corresponding to a lymph node in this location.  Also increased activity in Ivan right upper lobe nodule. Right level 3 lymph node in Ivan neck and anterior mediastinal nodal activity is diminished.  These areas are quite small on prior study. Diminished symmetric uptake within Ivan left as compared to Ivan right vocal cord. Right mandible increased activity in Ivan area of third molar.  This is consistent with his recent tooth infection.  # [facility]  Patient was offered for clinical trial with pembrolizumab/lenvatinib versus standard therapy. Patient received additional 1 cycle of Keytruda while waiting to be enrolled to Ivan trial.   #Disease progression.  Not eligible for Ivan clinical trial at [facility] Garcia, CT neck chest abdomen [facility] right upper paratracheal node is concerning for metastatic disease and a new from 01/20/2020 PET/CT.  Ivan node closely abuts and possibly invade Ivan posterolateral right tracheal wall as well as Ivan esophagus.  Right vocal cord paralysis.  Symmetric soft tissue density at Ivan right base of tongue could reflect residual/recurrent primary  malignancy versus posttreatment changes.  Right supraclavicular lymph node is no longer appreciated  with post treatment changes.  There is increased size of right minor fissure nodule which was metabolically avid on previous PET scan.  New mediastinal and hilar lymphadenopathy concerning for new nodal disease.  No evidence of metastatic disease below Ivan diaphragm.  Nonobstructing 3 mm distal ureter stone.  07/07/2020 started on palliative chemotherapy with docetaxel 09/02/2020 3 cycles of docetaxel  with G-CSF support.    CT scan done at [facility] which unfortunately showed progression of disease. Images are not available for me to review.  CT was compared to June 24, 2020 CT done at [facility]. 09/02/2020,chest without contrast with 3D MIPS protocol showed Interval increase in Ivan size of Ivan right upper lobe perifissural nodule 1.6 x 1.5 cm-previously 1.4 x 1.2 cm tracheoesophageal soft tissue mass 3.4 x 5.0 cm with regional mass-effect on Ivan esophagus-previously 2.4 x 3.6 cm, and mediastinal lymphadenopathy-right upper paratracheal lymph node 1.9 cm-previously 1.5 cm, suspicious for progressive metastatic disease. Subcentimeter lung nodules are stable. CT neck with contrast showed slight interval increase in size of upper right paratracheal nodal conglomerate.  Questionable soft tissue invasion of Ivan posterior tracheal appears worsened.  Compression with potential invasion of Ivan esophagus is again noted.  Prior vocal cord paralysis again noted.  Unchanged right tongue base asymmetry. I had a discussion with patient's [facility] oncologist Dr. Barrington [facility] over Ivan phone. [facility] recommends adding carboplatin AUC 5 to docetaxel regimen for now which hopefully to help to reduce Ivan size of Ivan tracheoesophageal soft tissue mass.  Also recommend palliative radiation. 09/07/2020, carboplatin AUC 5+ docetaxel S/p PEG tube placement on 09/23/2020 #09/28/2020, started on palliative radiation to esophageal  #Patient establish care with [facility] [place] [facility]   09/29/2020  guardant 360 which showed PIK3CA E545K, PIK3CA  amplification. MSI stable, APC [id number] VUS , APC [id number], FGFR2 R573 VUS   . 10/25/20 patient finished palliative radiation to esophageal mass. 11/09/2020, patient had a PET scan which showed partial response Right thoracic inlet Hypermetabolic soft tissue metastasis, 3.9 x 2.6cm, decreased in size, decreased size of  Hypermetabolic right paratracheal 1.2cm, and right hilar nodal Metastases since outside 09/02/2020 neck CT Right upper lobe lung nodule 1.2cm, stable size.  Dr. Baruch Gouty recommend additional radiation. 11/23/20-12/17/19 patient finished additional radiation  INTERVAL HISTORY [facility]. is a 65 y.o. male who presents for follow-up for metastatic squamous cell carcinoma, P 16+.   Patient has been on  Piqray $Remov'300mg'bbWssI$  daily. since 12/22/2020  Overall he tolerates well. Dysphagia symptoms are stable.  He is able to eat soft food orally for pleasure. Patient has PEG tube.  No fever, chills, shortness of breath.  Review of Systems  Constitutional: Negative for chills, fever, malaise/fatigue and weight loss.  HENT: Negative for sore throat.        Dysphagia  Eyes: Negative for redness.  Respiratory: Negative for cough, shortness of breath and wheezing.   Cardiovascular: Negative for chest pain, palpitations and leg swelling.  Gastrointestinal: Negative for abdominal pain, blood in stool, nausea and vomiting.       PEG tube placement  Genitourinary: Negative for dysuria.  Musculoskeletal: Negative for myalgias.  Skin: Negative for rash.  Neurological: Negative for dizziness, tingling and tremors.  Endo/Heme/Allergies: Does not bruise/bleed easily.  Psychiatric/Behavioral: Negative for hallucinations.    MEDICAL HISTORY:  Past Medical History:  Diagnosis Date  . Arthritis   . Cancer ([place])    Head and neck cancer  .  Hemorrhoids   . Hyperlipidemia   . Hypertension     SURGICAL HISTORY: Past Surgical History:  Procedure Laterality Date  . COLONOSCOPY WITH PROPOFOL  N/A 04/04/2017   Procedure: COLONOSCOPY WITH PROPOFOL;  Surgeon: Robert Bellow, MD;  Location[facility];  Service: Endoscopy;  Laterality: N/A;  . IR GASTROSTOMY TUBE MOD SED  09/24/2020  . MANDIBLE SURGERY    . PORT-A-CATH REMOVAL  07/17/2018  . PORTA CATH INSERTION N/A 12/19/2017   Procedure: PORTA CATH INSERTION;  Surgeon: Algernon Huxley, MD;  Location: [place] CV LAB;  Service: Cardiovascular;  Laterality: N/A;  . PORTA CATH INSERTION N/A 07/17/2018   Procedure: PORTA CATH INSERTION;  Surgeon: Algernon Huxley, MD;  Location: [place] [facility];  Service: Cardiovascular;  Laterality: N/A;  . PORTA CATH INSERTION N/A 09/18/2019   Procedure: PORTA CATH INSERTION;  Surgeon: Algernon Huxley, MD;  Location: [place] [facility];  Service: Cardiovascular;  Laterality: N/A;  . TONSILLECTOMY      SOCIAL HISTORY: Social History   Socioeconomic History  . Marital status: Single    Spouse name: Not on file  . Number of children: Not on file  . Years of education: Not on file  . Highest education level: Not on file  Occupational History  . Not on file  Tobacco Use  . Smoking status: Never Smoker  . Smokeless tobacco: Never Used  Vaping Use  . Vaping Use: Never used  Substance and Sexual Activity  . Alcohol use: No  . Drug use: No  . Sexual activity: Not on file  Other Topics Concern  . Not on file  Social History Narrative  . Not on file   Social Determinants of Health   Financial Resource Strain: Not on file  Food Insecurity: Not on file  Transportation Needs: Not on file  Physical Activity: Not on file  Stress: Not on file  Social Connections: Not on file  Intimate Partner Violence: Not on file    FAMILY HISTORY: Family History  Problem Relation Age of Onset  . Breast cancer Mother   . Asthma Mother   . Congestive Heart Failure Mother   . Prostate cancer Father   . Brain cancer Father   . Bladder Cancer Father     ALLERGIES:  has No Known  Allergies.  MEDICATIONS:  Current Outpatient Medications  Medication Sig Dispense Refill  . alpelisib (PIQRAY, 300 MG DAILY DOSE,) 2 x 150 MG Therapy Pack Take 300 mg once a day. 56 each 3  . amLODipine (NORVASC) 5 MG tablet Take 5 mg by mouth daily.    Marland Kitchen atorvastatin (LIPITOR) 20 MG tablet SMARTSIG:1 Tablet(s) By Mouth Every Evening    . lidocaine-prilocaine (EMLA) cream Apply to affected area once 30 g 3  . morphine (ROXANOL) 20 MG/ML concentrated solution Take 1 mL (20 mg total) by mouth every 4 (four) hours as needed for moderate pain or severe pain. (Patient not taking: Reported on 03/03/2021) 30 mL 0  . nystatin (MYCOSTATIN) 100000 UNIT/ML suspension Take 5 mLs (500,000 Units total) by mouth in Ivan morning, at noon, and at bedtime. SWISH AND SPIT (Patient not taking: Reported on 03/03/2021) 473 mL 0  . omeprazole (PRILOSEC) 20 MG capsule Take 1 capsule (20 mg total) by mouth daily. 90 capsule 1  . ondansetron (ZOFRAN) 8 MG tablet Take 1 tablet (8 mg total) by mouth 2 (two) times daily as needed for refractory nausea / vomiting. Start on day 3 after carboplatin  chemo. (Patient not taking: Reported on 03/03/2021) 30 tablet 1  . prochlorperazine (COMPAZINE) 10 MG tablet Take 1 tablet (10 mg total) by mouth every 6 (six) hours as needed (Nausea or vomiting). (Patient not taking: Reported on 03/03/2021) 30 tablet 1   No current facility-administered medications for this visit.   Facility-Administered Medications Ordered in Other Visits  Medication Dose Route Frequency Provider Last Rate Last Admin  . sodium chloride flush (NS) 0.9 % injection 10 mL  10 mL Intravenous PRN Earlie Server, MD      . sodium chloride flush (NS) 0.9 % injection 10 mL  10 mL Intravenous PRN Earlie Server, MD   10 mL at 10/13/20 1034     PHYSICAL EXAMINATION: ECOG PERFORMANCE STATUS: 1 - Symptomatic but completely ambulatory Vitals:   03/03/21 1002  BP: 115/88  [facility]arcia: (!) 108  Resp: 18  Temp: 98.1 F (36.7 C)   Physical  Exam Constitutional:      General: He is not in acute distress.    Appearance: He is not diaphoretic.  HENT:     Head: Normocephalic and atraumatic.     Nose: Nose normal.     Mouth/Throat:     Pharynx: No oropharyngeal exudate.  Eyes:     General: No scleral icterus.    Pupils: Pupils are equal, round, and reactive to light.  Neck:     Comments: Right cervical lymphadenopathy Cardiovascular:     Rate and Rhythm: Normal rate and regular rhythm.     Heart sounds: No murmur heard.   Pulmonary:     Effort: Pulmonary effort is normal. No respiratory distress.     Breath sounds: No rales.  Chest:     Chest wall: No tenderness.  Abdominal:     General: There is no distension.     Palpations: Abdomen is soft.     Tenderness: There is no abdominal tenderness.  Musculoskeletal:        General: Normal range of motion.     Cervical back: Normal range of motion and neck supple.  Skin:    General: Skin is warm and dry.     Findings: No erythema.  Neurological:     Mental Status: He is alert and oriented to person, place, and time.     Cranial Nerves: No cranial nerve deficit.     Motor: No abnormal muscle tone.     Coordination: Coordination normal.  Psychiatric:        Mood and Affect: Affect normal.      LABORATORY DATA:  I have reviewed Ivan data as listed Lab Results  Component Value Date   WBC 6.9 03/03/2021   HGB 15.0 03/03/2021   HCT 45.6 03/03/2021   MCV 90.1 03/03/2021   PLT 288 03/03/2021   Recent Labs    08/17/20 0804 08/23/20 1414 08/31/20 1244 09/07/20 0833 01/20/21 0901 02/03/21 0928 03/03/21 0923  NA 139 137 137   < > 140 140 139  K 4.0 3.6 4.5   < > 4.2 4.4 4.2  CL 105 100 98   < > 100 100 98  CO2 $Re'25 26 27   'nXa$ < > $R'29 29 29  'BH$ GLUCOSE 101* 129* 134*   < > 90 106* 103*  BUN 23 21 25*   < > 27* 24* 29*  CREATININE 1.52* 1.50* 1.64*   < > 1.35* 1.26* 1.27*  CALCIUM 9.2 8.9 9.3   < > 9.4 9.4 9.3  GFRNONAA 47* 48* 43*   < >  58* >60 >60  GFRAA 55* 56*  50*  --   --   --   --   PROT 7.4 7.0 7.3   < > 7.6 7.4 7.2  ALBUMIN 3.5 3.5 3.5   < > 4.0 4.1 4.0  AST $Re'19 18 20   'Voj$ < > $R'16 17 18  'iF$ ALT $'16 16 17   'K$ < > $R'19 20 19  'Up$ ALKPHOS 82 93 103   < > 73 81 66  BILITOT 0.7 0.8 0.6   < > 0.8 0.9 0.9   < > = values in this interval not displayed.  RADIOGRAPHIC STUDIES: I have personally reviewed Ivan radiological images as listed and agreed with Ivan findings in Ivan report. CT SOFT TISSUE NECK W CONTRAST  Result Date: 01/05/2021 CLINICAL DATA:  Squamous cell carcinoma of oropharynx, multiple recurrences and treatments, follow-up EXAM: CT NECK WITH CONTRAST TECHNIQUE: Multidetector CT imaging of Ivan neck was performed using Ivan standard protocol following Ivan bolus administration of intravenous contrast. CONTRAST:  64mL OMNIPAQUE IOHEXOL 300 MG/ML  SOLN COMPARISON:  PET-CT 11/10/2020 FINDINGS: Pharynx and larynx: Post radiation changes are present. Medialization of Ivan right vocal cord. No evidence of local recurrent disease. Salivary glands: Post radiation changes.  Otherwise unremarkable. Thyroid: Unremarkable. Lymph nodes: Confluent soft tissue is again identified near Ivan thoracic inlet along Ivan right aspect of Ivan esophagus and posterior to Ivan right lobe of Ivan thyroid. Margins are poorly delineated and therefore measurement is difficult, but there is no progression since Ivan 11/09/2020 PET-CT. No new adenopathy identified. Vascular: Major neck vessels are patent. Limited intracranial: No abnormal enhancement. Visualized orbits: Unremarkable. Mastoids and visualized paranasal sinuses: Minor paranasal sinus mucosal thickening. Mastoid air cells are clear. Skeleton: Cervical spine degenerative changes. Upper chest: Dictated separately. Other: None. IMPRESSION: No progression of right paraesophageal metastatic nodal conglomerate since 11/10/2020 PET-CT. Comparison is difficult and this lesion is either stable or decreased in size. No new adenopathy in Ivan neck. Stable post  treatment appearance of Ivan pharynx and larynx. Electronically Signed   By: Macy Mis IvanD.   On: 01/05/2021 10:57   CT Chest W Contrast  Result Date: 01/05/2021 CLINICAL DATA:  Restaging squamous cell carcinoma Ivan oropharynx and chest. EXAM: CT CHEST WITH CONTRAST TECHNIQUE: Multidetector CT imaging of Ivan chest was performed during intravenous contrast administration. CONTRAST:  58mL OMNIPAQUE IOHEXOL 300 MG/ML  SOLN COMPARISON:  PET-CT 11/09/2020 and CT scan 09/07/2020 FINDINGS: Cardiovascular: Ivan heart is normal in size. No pericardial effusion. Ivan aorta is normal in caliber. No dissection. Scattered atherosclerotic calcifications. Stable coronary artery calcifications. Ivan pulmonary arteries are unremarkable. Mediastinum/Nodes: Persistent soft tissue density Ivan involving Ivan thoracic inlet region posterior to Ivan cricoid cartilage and extending down into Ivan upper mediastinum. This measures a maximum 3.7 x 2.0 cm. On Ivan prior CT scan it measured 5.5 x 4.0 cm and now appears less solid. When compared to Ivan prior CT scan from 09/02/2020 Ivan right paratracheal nodal mass has significantly decreased in size. It measures 6.5 mm on image number 17/4 and previously measured 17 mm. Right hilar node on image number 29/for measures 11 mm and previously measured 17 mm. Precarinal lymph node on image number 26/4 measures 5 mm and previously measured 8 mm. No new or progressive findings. Ivan esophagus is grossly normal. Lungs/Pleura: 10.5 x 10.5 mm right upper lobe pulmonary nodule on image number 88/6 previously measured 15 x 14 mm. Other areas of scattered ground-glass opacity in Ivan right lung are new and  likely inflammatory. Some of these were present on Ivan prior PET-CT from December. Mild bronchiectasis and patchy ground-glass opacity in Ivan right middle lobe appears slightly improved when compared to Ivan prior PET-CT. No pleural effusions or pleural nodules. Upper Abdomen: No significant upper abdominal  findings. Stable left hepatic lobe cyst. A feeding gastrostomy tube is noted. No adrenal gland lesions. Musculoskeletal: No significant bony findings. IMPRESSION: 1. Interval regression of thoracic inlet lesion, mediastinal and right hilar adenopathy and right upper lobe pulmonary nodule. No new or progressive findings. 2. Patchy ground-glass opacities in Ivan right lung are likely inflammatory. 8 mm sub solid right upper lobe nodule will require surveillance. 3. No findings for upper abdominal metastatic disease or osseous metastatic disease. Aortic Atherosclerosis (ICD10-I70.0) and Emphysema (ICD10-J43.9). Electronically Signed   By: Marijo Sanes IvanD.   On: 01/05/2021 10:41   DG Fluoro Guide CV Line Right  Result Date: 02/04/2021 INDICATION: History of head neck cancer with surgical placement of a port a catheter on 09/18/2019. Patient now with difficulty aspirating from Ivan port a catheter and as such presents for fluoroscopic guided evaluation. EXAM: FLUOROSCOPIC GUIDED PORT A CATHETER CHECK MEDICATIONS: None. CONTRAST:  None FLUOROSCOPY TIME:  12 seconds COMPLICATIONS: None immediate. TECHNIQUE: Ivan procedure, risks, benefits, and alternatives were explained to Ivan patient and informed written consent was obtained. A timeout was performed prior to Ivan initiation of Ivan procedure. Ivan patient's chest port a catheter was accessed by Ivan IV team. Ivan patient was placed supine on Ivan fluoroscopy table. A preprocedural spot fluoroscopic image was obtained of Ivan chest in existing port a catheter. Note was made of difficulty aspirating blood from Ivan port a catheter. Contrast was injected via Ivan Port a catheter and images were reviewed. Ivan Port a catheter was flushed with a heparin dwell and de accessed. A dressing was placed. Ivan patient tolerated Ivan procedure well without immediate postprocedural complication. FINDINGS: Unchanged positioning of anterior chest wall jugular vein approach port a catheter with  tip projected over Ivan expected location of Ivan superior cavoatrial junction. There was difficulty aspirating blood from Ivan port a catheter. Contrast injection demonstrated Ivan presence of a fibrin sheath about Ivan distal end of Ivan Port a catheter which was located within Ivan mid aspect of Ivan SVC. There is no evidence of catheter kink or fracture. No contrast extravasation. IMPRESSION: Difficulty aspirating of Ivan Port a catheter is secondary to Ivan presence of a fibrin sheath. Above findings were discussed with referring oncologist, Dr. Tasia Catchings, and Ivan following options were provided: 1. Maintain Ivan Port a catheter as is and attempt a tPA dwell realizing that Ivan port a catheter may remain difficult for blood draws, but could be used for medication administration. 2. Attempt to perform a fibrin sheath stripping from Ivan port a catheter tip via a right common femoral vein approach. Note, given otherwise appropriate positioning and functionality of Ivan Port a catheter, I feel that a fibrin sheath stripping should be pursued prior to definitive Port a catheter revision and as such this was offered at this time. After discussion of potential treatment options, Dr. Tasia Catchings will perform a tPA dwell and if unsuccessful or non durable, will refer for attempted fibrin sheath stripping. Above was discussed with Ivan patient who is in agreement with Ivan proposed plan care. Electronically Signed   By: Sandi Mariscal IvanD.   On: 02/04/2021 14:26     ASSESSMENT & PLAN:   1. 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[place])   2.  Port-A-Cath in place   3. Dysphagia, unspecified type   4. 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 Patient finished palliative radiation to cervical lymphadenopathy/peri-esophageal mass. Continue Piqray 300 mg daily.  Zyrtec for  rash prophylaxis Obtain CT in late April/early May to evaluate treatment response.  Review images on Ivan computer with patient.  Marland Kitchen #Hyperglycemia.  Likely secondary to  [facility].  A1C 7.4  Glucose level has improved today. Diabetic dieting.  # Dysphagia, s/p for rash prophylaxis.  PET fube placement.   # Neoplasm associated pain, no pain currently. Symptoms are stable. #Port-A-Cath in place,    Follow-up: Follow-up in 4 weeks. Earlie Server, MD, PhD Hematology Oncology [place] [facility] 03/03/2021

## 2021-03-03 NOTE — Progress Notes (Signed)
[employer] (patient assistance) will need a new prescription for every refill Piqray.  Patient denies new problems/concerns today.

## 2021-03-03 NOTE — Progress Notes (Signed)
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  S/p PEG placement on 09/24/20.  Patient has completed more radiation and started piqray.    Spoke with patient via phone for nutrition follow-up.  Patient reports that he continues giving 5 cartons of [employer] 1.4 via feeding tube without difficulty.  Leaking has stopped.  Continues to snack on soft foods (yogurt, soups, etc) but is not eating a full meal.    Medications: reviewed  Labs: reviewed  Anthropometrics:   Weight 188 lb today increased from 185 lb 3.2 oz on 3/3  182 lb on 1/18   Estimated Energy Needs  Kcals: 2100-2500 Protein: 105-125 g Fluid: > 2.1 L  NUTRITION DIAGNOSIS: Inadequate oral intake but relying on feeding tube   INTERVENTION:  Continue [employer] 1.4, 5 cartons per day (1 carton 5 times per day).  Flush with 60 ml of water before and 120 ml after each feeding.  Patient to drink or give additional 2 cups of water via tube daily for better hydration.   Patient has plenty of enteral supplies from [place].   Patient has contact information    MONITORING, EVALUATION, GOAL: weight trends, tube feeding tolerance, intake   NEXT VISIT: Tuesday, May 3rd after MD appt  Ivan Garcia, [place], [place] Registered Dietitian [phone] (mobile)

## 2021-03-29 ENCOUNTER — Other Ambulatory Visit: Payer: Self-pay

## 2021-03-29 ENCOUNTER — Inpatient Hospital Stay: Payer: Medicare Other | Attending: Oncology

## 2021-03-29 DIAGNOSIS — C109 Malignant neoplasm of oropharynx, unspecified: Secondary | ICD-10-CM | POA: Diagnosis present

## 2021-03-29 DIAGNOSIS — Z95828 Presence of other vascular implants and grafts: Secondary | ICD-10-CM

## 2021-03-29 DIAGNOSIS — Z452 Encounter for adjustment and management of vascular access device: Secondary | ICD-10-CM | POA: Insufficient documentation

## 2021-03-29 MED ORDER — SODIUM CHLORIDE 0.9% FLUSH
10.0000 mL | Freq: Once | INTRAVENOUS | Status: AC
  Administered 2021-03-29: 10 mL
  Filled 2021-03-29: qty 10

## 2021-03-29 MED ORDER — HEPARIN SOD (PORK) LOCK FLUSH 100 UNIT/ML IV SOLN
500.0000 [IU] | Freq: Once | INTRAVENOUS | Status: AC
  Administered 2021-03-29: 500 [IU]
  Filled 2021-03-29: qty 5

## 2021-03-31 ENCOUNTER — Telehealth: Payer: Self-pay

## 2021-03-31 NOTE — Telephone Encounter (Signed)
Per [place] from [facility], CPhT: Ivan Garcia just called.  He is complaining of constant neck and face pain, headaches at the back of the neck and some radiating pain to the shoulder, pain swallowing where the tumor was.  He says this has been occurring for the past month.  He wasn't sure if this is a side effect of the Piqray.  He is taking Tylenol 650mg  + 1 Advil daily for the pain and it helps.  He also has a rash on the back of his leg about the size of a 50 ct piece (rash is a side effect of piqray), and has been taking Allegra with no resolve. He wants to know whether to continue the [facility] or hold until he is seen.   Dr. Tasia Catchings advises for patien to hold off until he sees her.   Called and spoke to Ivan Garcia, he verbalized understanding and give update on symptoms at next visit.

## 2021-04-01 ENCOUNTER — Inpatient Hospital Stay: Payer: Medicare Other

## 2021-04-01 ENCOUNTER — Ambulatory Visit
Admission: RE | Admit: 2021-04-01 | Discharge: 2021-04-01 | Disposition: A | Discharge status: Home / Self Care | Payer: Medicare Other | Source: Ambulatory Visit | Attending: Oncology | Admitting: Oncology

## 2021-04-01 ENCOUNTER — Other Ambulatory Visit: Payer: Self-pay

## 2021-04-01 DIAGNOSIS — C109 Malignant neoplasm of oropharynx, unspecified: Secondary | ICD-10-CM | POA: Insufficient documentation

## 2021-04-01 DIAGNOSIS — Z452 Encounter for adjustment and management of vascular access device: Secondary | ICD-10-CM | POA: Diagnosis not present

## 2021-04-01 LAB — COMPREHENSIVE METABOLIC PANEL
ALT: 19 U/L (ref 0–44)
AST: 22 U/L (ref 15–41)
Albumin: 4.3 g/dL (ref 3.5–5.0)
Alkaline Phosphatase: 61 U/L (ref 38–126)
Anion gap: 9 (ref 5–15)
BUN: 25 mg/dL — ABNORMAL HIGH (ref 8–23)
CO2: 29 mmol/L (ref 22–32)
Calcium: 9.4 mg/dL (ref 8.9–10.3)
Chloride: 99 mmol/L (ref 98–111)
Creatinine, Ser: 1.18 mg/dL (ref 0.61–1.24)
GFR, Estimated: >60 mL/min (ref >60)
Glucose, Bld: 102 mg/dL — ABNORMAL HIGH (ref 70–99)
Potassium: 4 mmol/L (ref 3.5–5.1)
Sodium: 137 mmol/L (ref 135–145)
Total Bilirubin: 1.1 mg/dL (ref 0.3–1.2)
Total Protein: 7.3 g/dL (ref 6.5–8.1)

## 2021-04-01 LAB — CBC WITH DIFFERENTIAL/PLATELET
Abs Immature Granulocytes: 0.03 10*3/uL (ref 0.00–0.07)
Basophils Absolute: 0 10*3/uL (ref 0.0–0.1)
Basophils Relative: 1 %
Eosinophils Absolute: 0.3 10*3/uL (ref 0.0–0.5)
Eosinophils Relative: 4 %
HCT: 46.4 % (ref 39.0–52.0)
Hemoglobin: 15.6 g/dL (ref 13.0–17.0)
Immature Granulocytes: 0 %
Lymphocytes Relative: 5 %
Lymphs Abs: 0.4 10*3/uL — ABNORMAL LOW (ref 0.7–4.0)
MCH: 30.9 pg (ref 26.0–34.0)
MCHC: 33.6 g/dL (ref 30.0–36.0)
MCV: 91.9 fL (ref 80.0–100.0)
Monocytes Absolute: 0.7 10*3/uL (ref 0.1–1.0)
Monocytes Relative: 8 %
Neutro Abs: 6.6 10*3/uL (ref 1.7–7.7)
Neutrophils Relative %: 82 %
Platelets: 270 10*3/uL (ref 150–400)
RBC: 5.05 MIL/uL (ref 4.22–5.81)
RDW: 13.9 % (ref 11.5–15.5)
WBC: 8 10*3/uL (ref 4.0–10.5)
nRBC: 0 % (ref 0.0–0.2)

## 2021-04-01 IMAGING — CT CT CHEST W/ CM
2 of 3 series · 14 of 35 positions shown, 18 images · IV contrast (omnipaque)
Comparison: [DATE].  Neck CT from today's dictated separately.

CLINICAL DATA: Squamous cell carcinoma of the oropharynx and chest.
Diagnosed 4 years ago.

EXAM:
CT CHEST WITH CONTRAST
TECHNIQUE: Multidetector CT imaging of the chest was performed during
intravenous contrast administration.
CONTRAST:  75mL OMNIPAQUE IOHEXOL 300 MG/ML  SOLN

[Series 3: axial st · axial · 0.74mm/px · z∈[-480,-205]mm · 13 of 65 slices shown, 17 images]
[im 5/65  mediastinal]
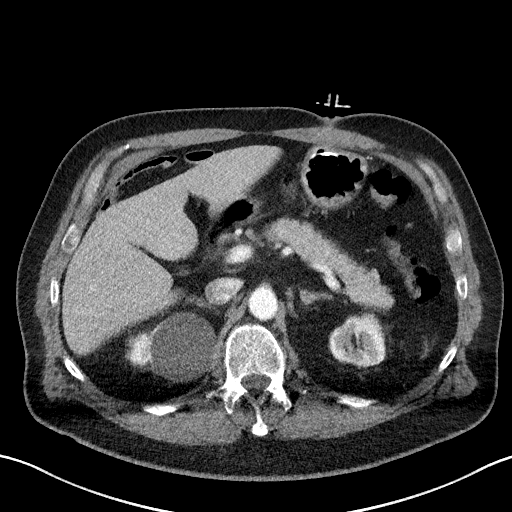
[im 5/65  lung]
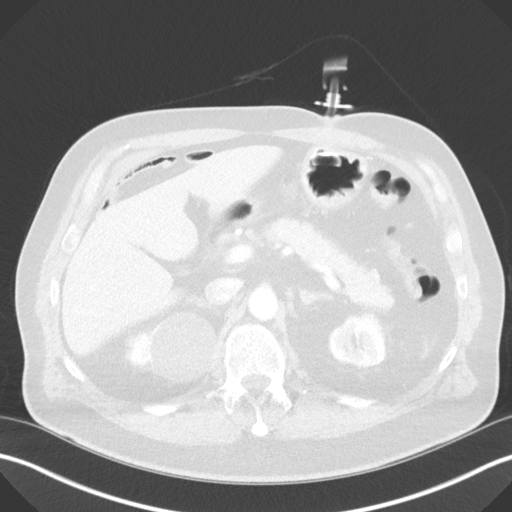
[im 10/65  lung]
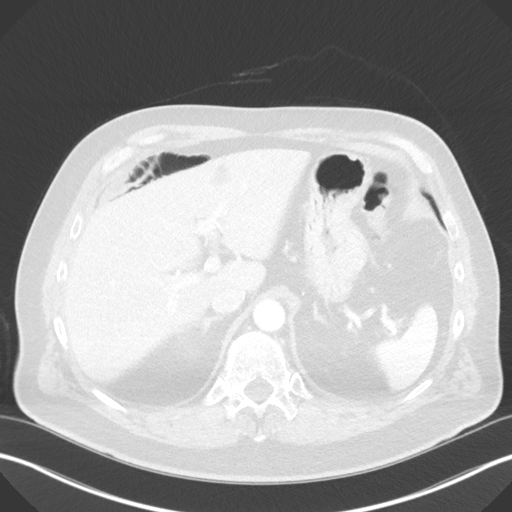
[im 15/65  lung]
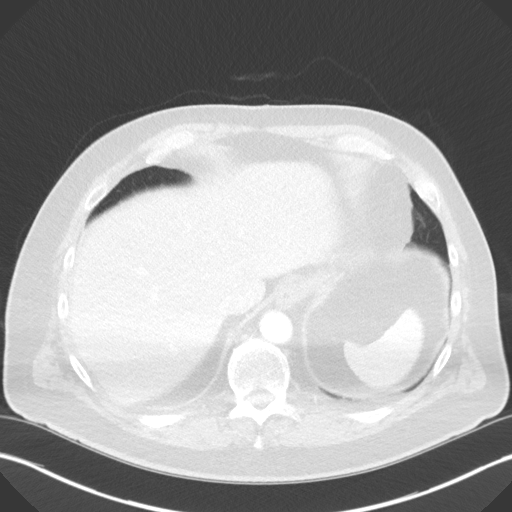
[im 19/65  lung]
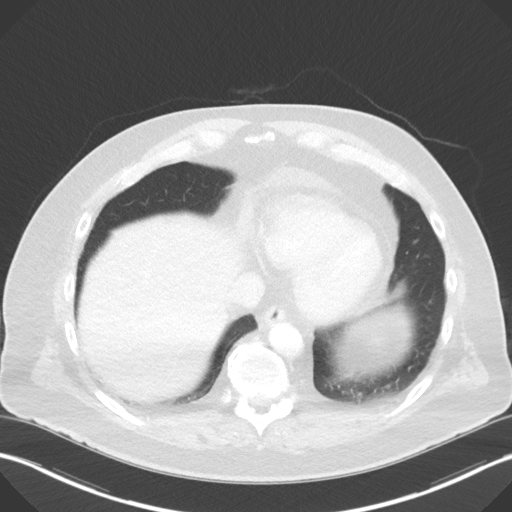
[im 24/65  mediastinal]
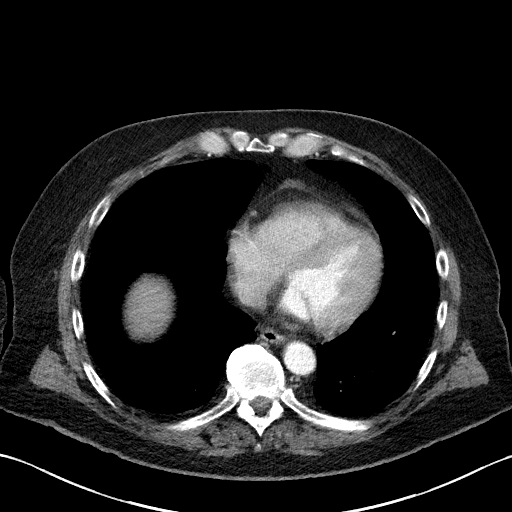
[im 24/65  lung]
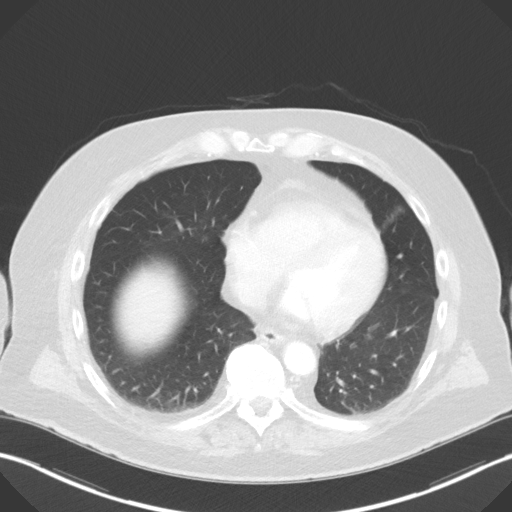
[im 29/65  lung]
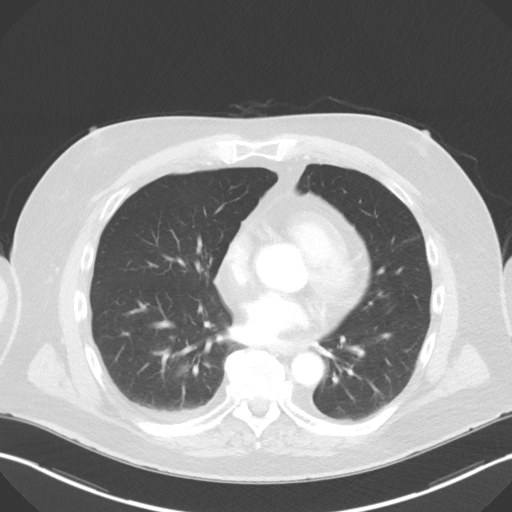
[im 33/65  lung]
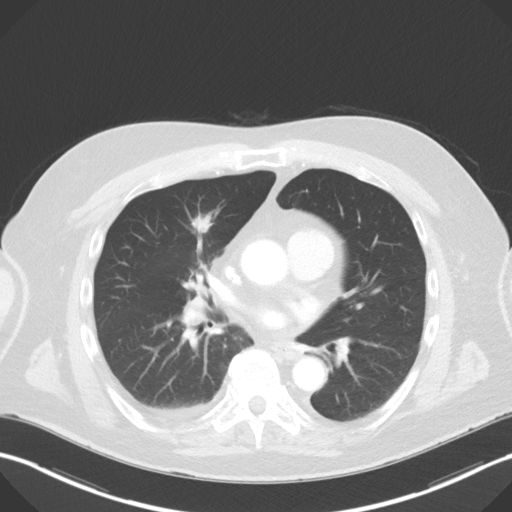
[im 36/65  lung]
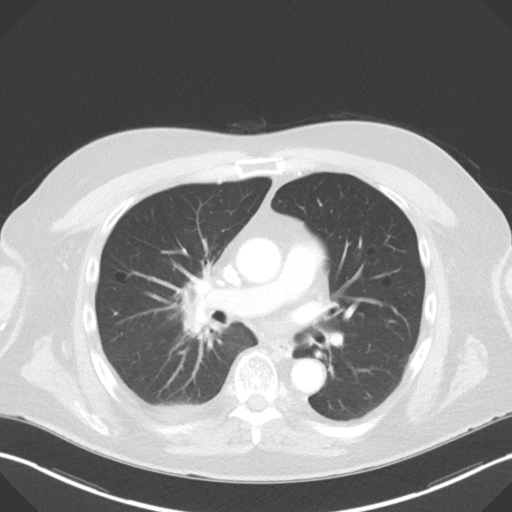
[im 41/65  mediastinal]
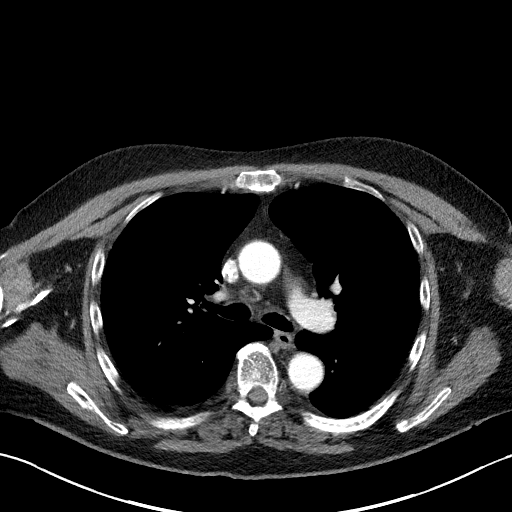
[im 41/65  lung]
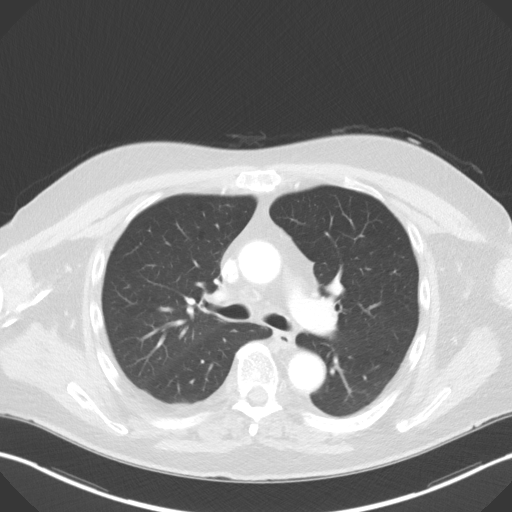
[im 46/65  lung]
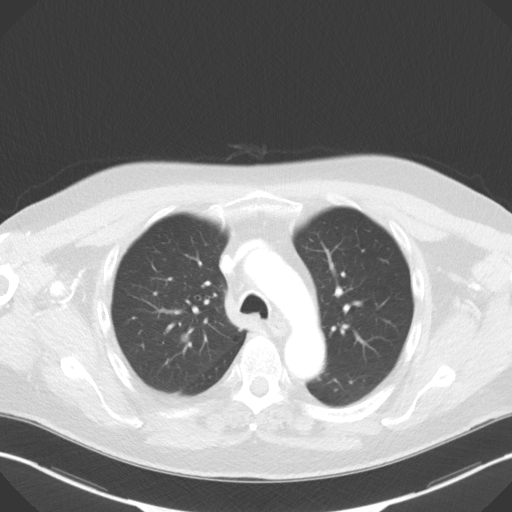
[im 50/65  lung]
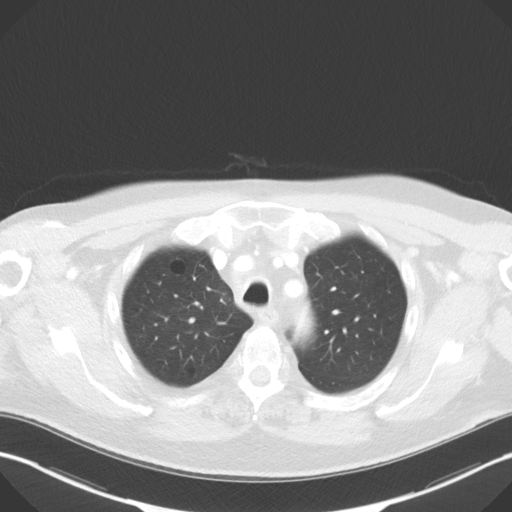
[im 55/65  lung]
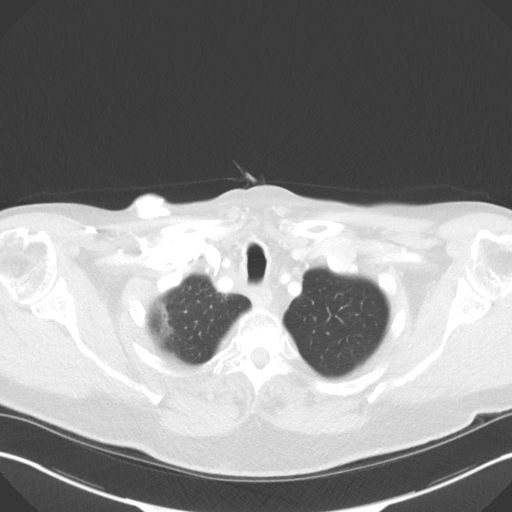
[im 60/65  mediastinal]
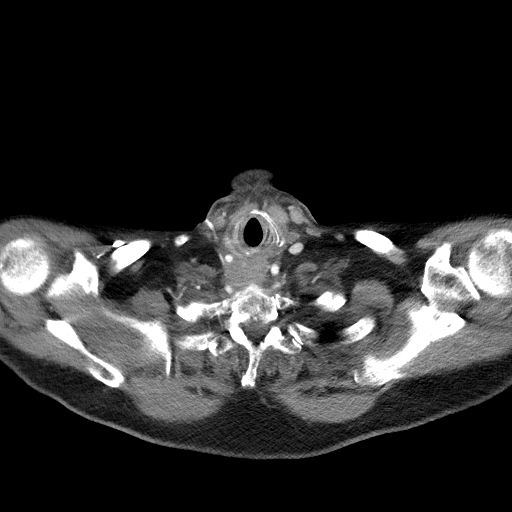
[im 60/65  lung]
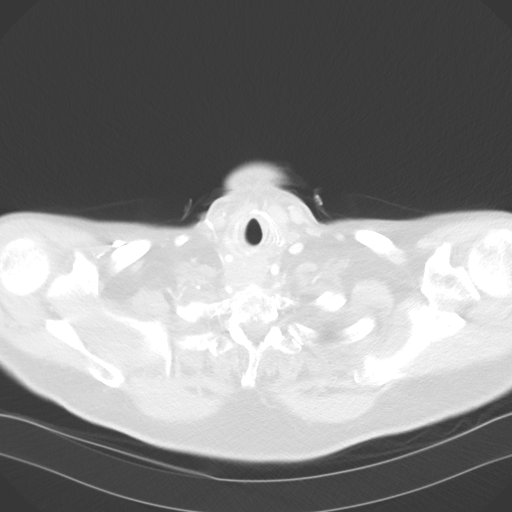

[Series 6: coronal · coronal · 0.71mm/px · 1 of 141 slices shown]
[im 71/141  lung]
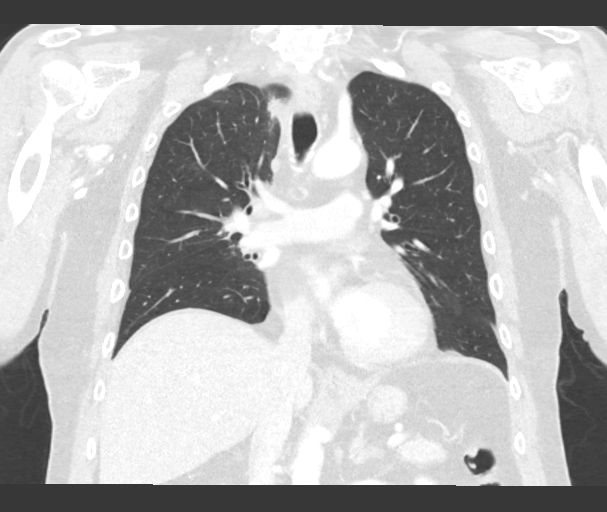

[14 of 35 positions shown; findings below may reference images not displayed]

FINDINGS: Cardiovascular: Right-sided Port-A-Cath terminates at the superior
caval/atrial junction. Aortic atherosclerosis. Normal heart size,
without pericardial effusion. Three vessel coronary artery
calcification. No central pulmonary embolism, on this non-dedicated
study.

Mediastinum/Nodes: Soft tissue mass at the right side of the
thoracic inlet was detailed on neck CT dictated separately.

Right paratracheal nodal tissue is similar at 8 mm on [DATE].

Soft tissue stranding surrounding the right hilum including on [DATE],
without residual well-defined adenopathy.

Lungs/Pleura: Trace right pleural fluid is new since the prior CT.

Presumed secretions within the dependent trachea.

Inferior right upper lobe pulmonary nodule measures 1.2 by 1.3 cm on
34/4 versus 1.1 x 1.0 cm on the prior exam.

A central right lower lobe pulmonary nodule measures 1.1 x 1.2 cm on
38/4 versus 5 mm on the prior exam (when remeasured).

8 mm posterior right upper lobe nodule on [DATE] is enlarged from 4 mm
on image 56/6 of the prior exam.

The previously described ground-glass opacities have resolved.

Upper Abdomen: Segment 2-3 hepatic cyst and too small to
characterize lesion are unchanged. Normal imaged portions of the
spleen, pancreas, gallbladder, adrenal glands. Mild renal cortical
thinning bilaterally. Upper pole right renal 4.9 cm fluid density
lesion is likely a cyst. Gastrostomy tube is appropriately
positioned.

Musculoskeletal: No acute osseous abnormality.
IMPRESSION: 1. Enlargement of right-sided pulmonary nodules, most consistent
with metastasis.
2. No thoracic adenopathy. Please see neck CT for discussion of mass
at the thoracic inlet.
3. Aortic atherosclerosis ([58]-[58]), coronary artery
atherosclerosis and emphysema ([58]-[58]).
4. New soft tissue stranding surrounding the right hilum with
resolution of adenopathy. Possibly related to interval response to
radiation therapy.
5. New trace right pleural fluid.
6. Aortic atherosclerosis ([58]-[58]), coronary artery
atherosclerosis and emphysema ([58]-[58]).

## 2021-04-01 IMAGING — CT CT NECK W/ CM
3 of 5 series · 12 of 33 positions shown, 14 images · IV contrast (omnipaque)
Comparison: CT neck [DATE].  PET CT [DATE]

CLINICAL DATA: Squamous cell carcinoma oropharynx. Multiple
recurrences. Original diagnosis 4 years ago.

EXAM:
CT NECK WITH CONTRAST
TECHNIQUE: Multidetector CT imaging of the neck was performed using the
standard protocol following the bolus administration of intravenous
contrast.
CONTRAST:  75mL OMNIPAQUE IOHEXOL 300 MG/ML  SOLN

[Series 7: sag neck · sagittal · 0.57mm/px · 5 of 87 slices shown, 6 images]
[im 29/87  bone]
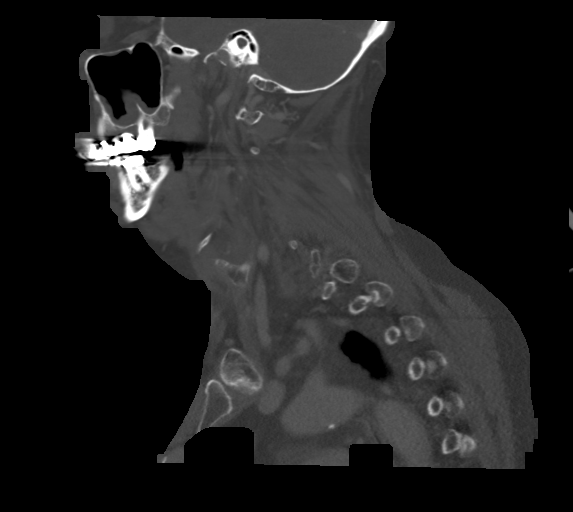
[im 36/87  bone]
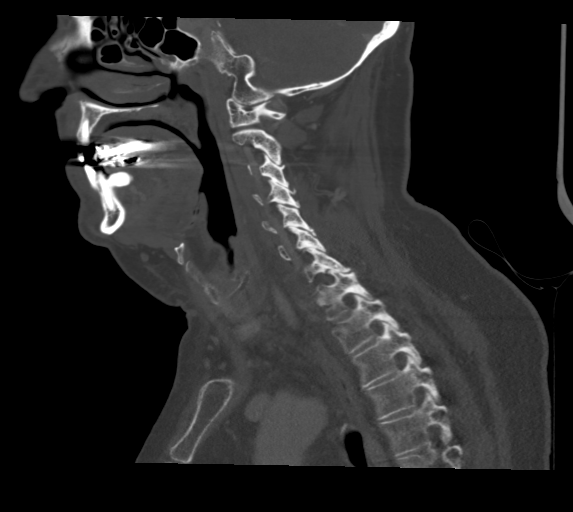
[im 44/87  soft-tissue]
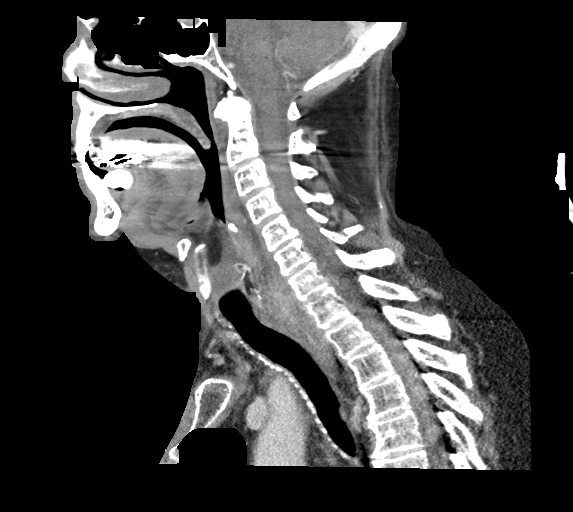
[im 44/87  bone]
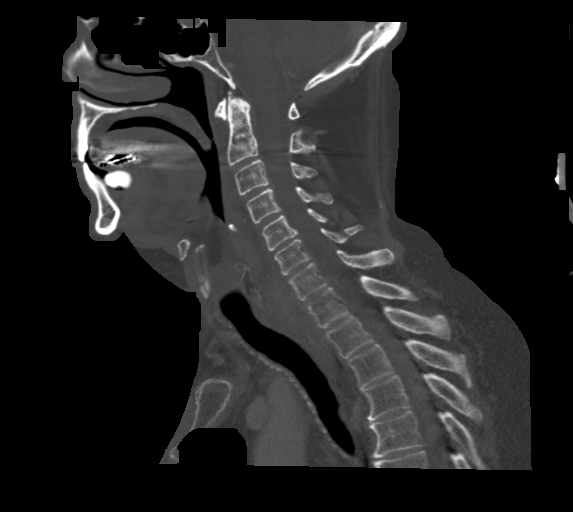
[im 51/87  bone]
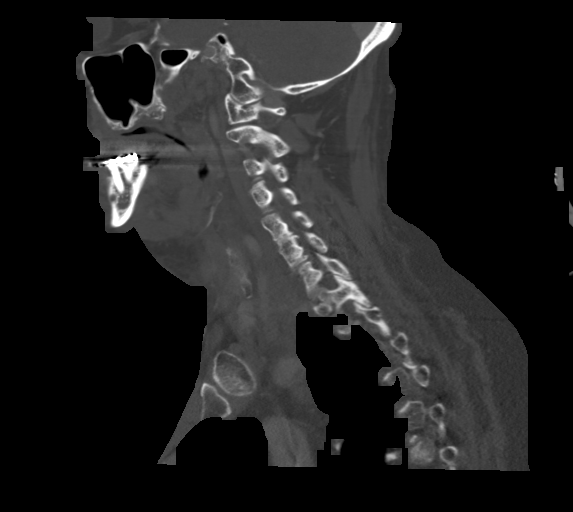
[im 58/87  bone]
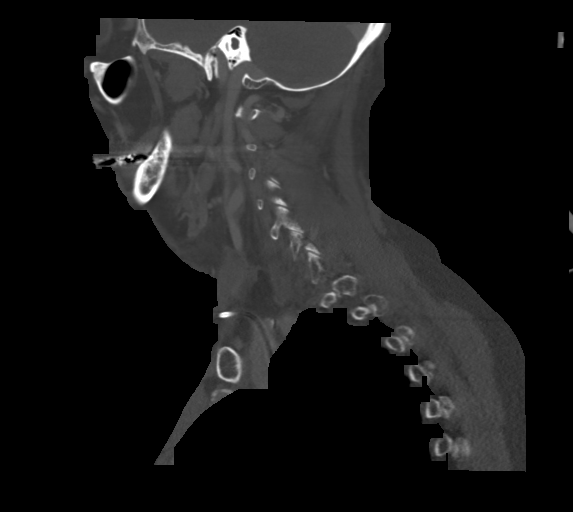

[Series 8: cor neck · coronal · 0.44mm/px · 3 of 152 slices shown]
[im 31/152  bone]
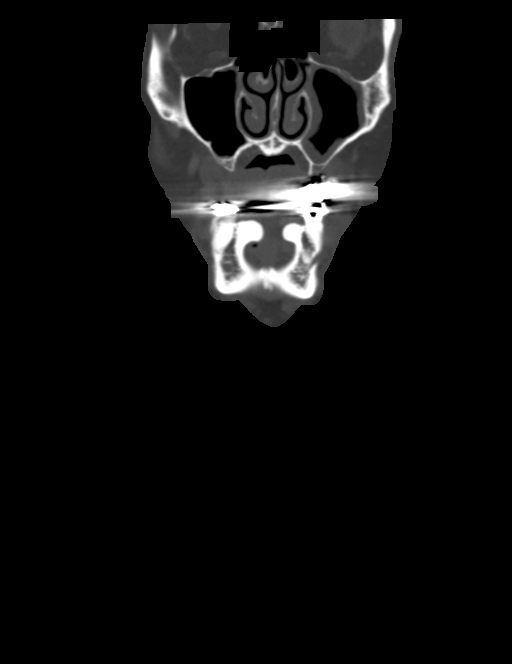
[im 61/152  bone]
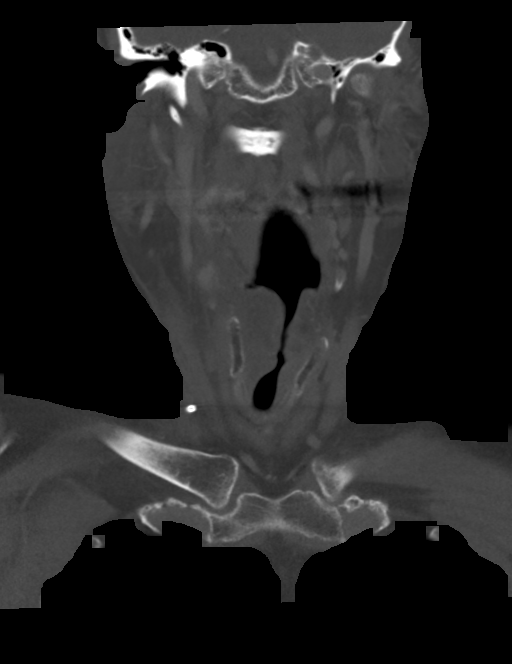
[im 91/152  bone]
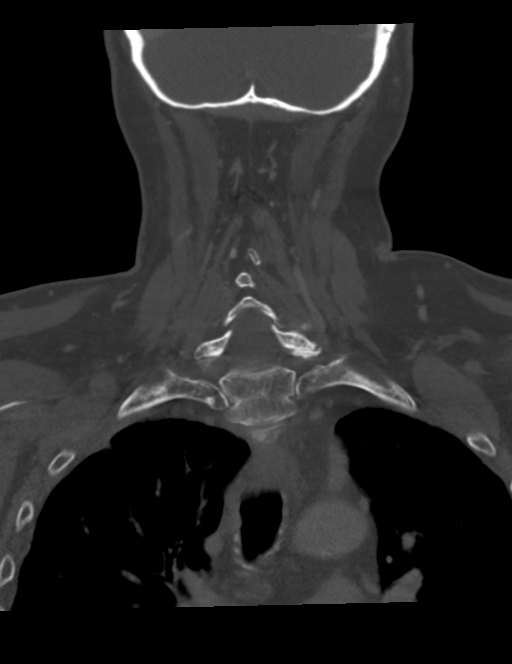

[Series 9: orthogonal ax · axial · 0.42mm/px · z∈[-318,-115]mm · 4 of 160 slices shown, 5 images]
[im 27/160  soft-tissue]
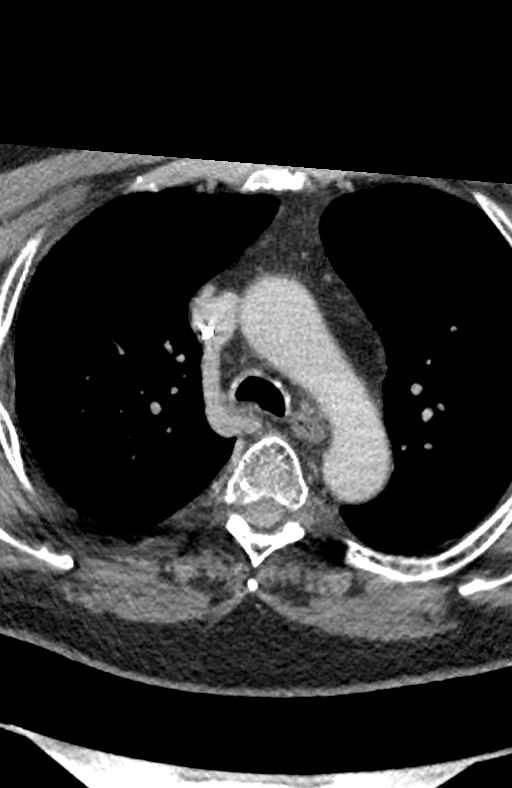
[im 27/160  bone]
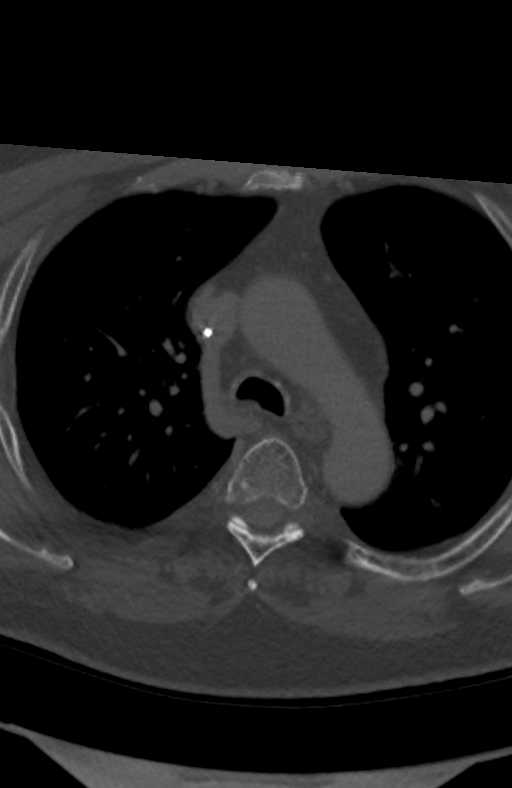
[im 54/160  bone]
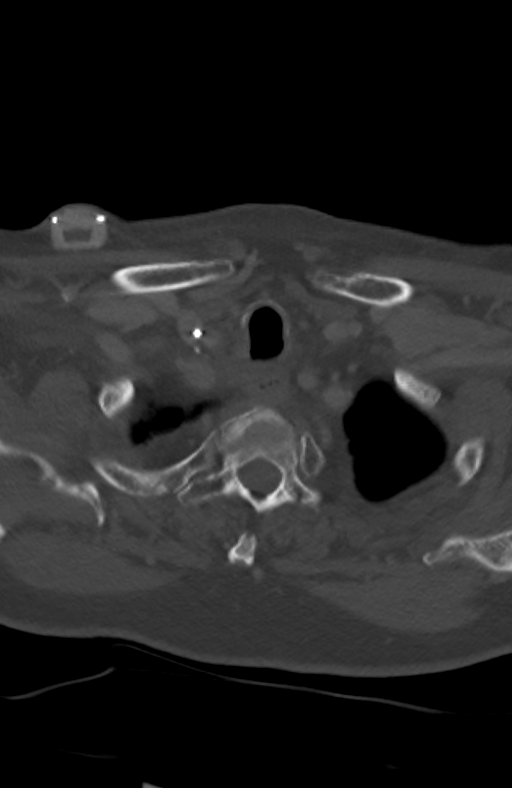
[im 107/160  bone]
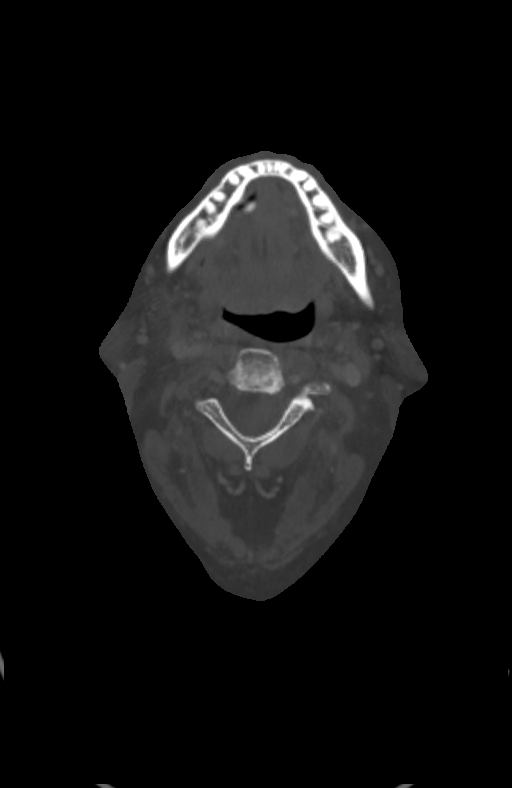
[im 133/160  bone]
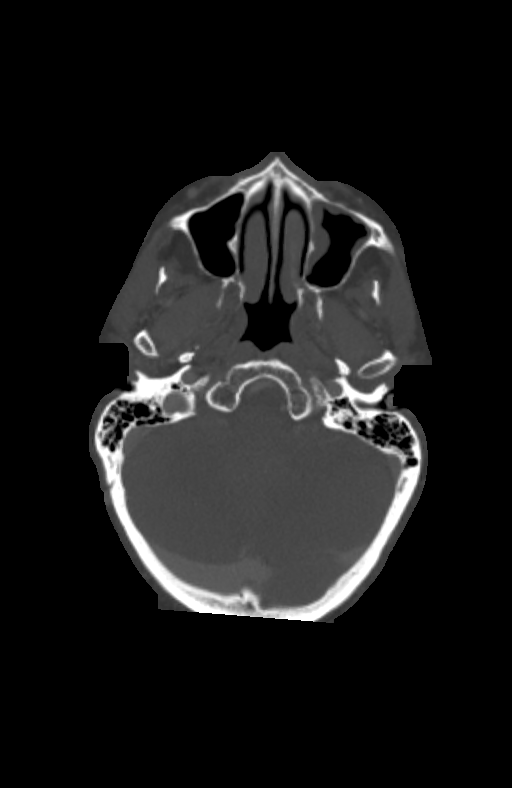

[12 of 33 positions shown; findings below may reference images not displayed]

FINDINGS: Pharynx and larynx: Progressive soft tissue swelling and low-density
edema in the larynx and supraglottic tissues, right greater than
left. Favor radiation change with progression. Direct mucosal
visualization recommended. Oropharynx negative for mass.

Salivary glands: No mass or edema. Atrophic changes in the
submandibular gland bilaterally.

Thyroid: Negative

Lymph nodes: Lymph node mass at the thoracic inlet to the right of
the esophagus again noted and similar. This appears to be related to
tumor and was hypermetabolic on the prior PET-CT. Possible invasion
of the esophagus. Margins are ill-defined and difficult to measure
particularly along the esophageal border. No gross change from the
prior CT.

Vascular: Normal vascular enhancement. Port-A-Cath right jugular
vein

Limited intracranial: Negative

Visualized orbits: Negative

Mastoids and visualized paranasal sinuses: Mucosal edema left
maxillary sinus. Remaining sinuses clear.

Skeleton: Cervical spondylosis without acute abnormality.

Upper chest: Chest CT today reported separately

Other: Edema in the anterior subcutaneous tissues of the neck
related to radiation change.
IMPRESSION: 1. Progressive enlargement of the right larynx and supraglottic
tissues with low-density most compatible with edema from radiation.
Correlate with direct visualization mucosa to evaluate for tumor
recurrence in this area
2. Right paraesophageal lymph node mass is ill-defined and unchanged
in size. Possible invasion of the esophagus.

## 2021-04-01 MED ORDER — IOHEXOL 300 MG/ML  SOLN
75.0000 mL | Freq: Once | INTRAMUSCULAR | Status: AC | PRN
  Administered 2021-04-01: 75 mL via INTRAVENOUS

## 2021-04-04 ENCOUNTER — Other Ambulatory Visit: Payer: Self-pay

## 2021-04-04 ENCOUNTER — Inpatient Hospital Stay: Payer: Medicare Other | Attending: Hospice and Palliative Medicine | Admitting: Hospice and Palliative Medicine

## 2021-04-04 DIAGNOSIS — Z803 Family history of malignant neoplasm of breast: Secondary | ICD-10-CM | POA: Insufficient documentation

## 2021-04-04 DIAGNOSIS — Z8052 Family history of malignant neoplasm of bladder: Secondary | ICD-10-CM | POA: Insufficient documentation

## 2021-04-04 DIAGNOSIS — Z931 Gastrostomy status: Secondary | ICD-10-CM | POA: Insufficient documentation

## 2021-04-04 DIAGNOSIS — G893 Neoplasm related pain (acute) (chronic): Secondary | ICD-10-CM | POA: Insufficient documentation

## 2021-04-04 DIAGNOSIS — C109 Malignant neoplasm of oropharynx, unspecified: Secondary | ICD-10-CM | POA: Insufficient documentation

## 2021-04-04 DIAGNOSIS — Z808 Family history of malignant neoplasm of other organs or systems: Secondary | ICD-10-CM | POA: Insufficient documentation

## 2021-04-04 DIAGNOSIS — Z8042 Family history of malignant neoplasm of prostate: Secondary | ICD-10-CM | POA: Insufficient documentation

## 2021-04-04 DIAGNOSIS — R1310 Dysphagia, unspecified: Secondary | ICD-10-CM | POA: Insufficient documentation

## 2021-04-04 NOTE — Progress Notes (Signed)
I did not reach patient for scheduled MyChart visit.  Voicemail left.  Will reschedule. 

## 2021-04-05 ENCOUNTER — Other Ambulatory Visit: Payer: Self-pay

## 2021-04-05 ENCOUNTER — Inpatient Hospital Stay (HOSPITAL_BASED_OUTPATIENT_CLINIC_OR_DEPARTMENT_OTHER): Payer: Medicare Other | Admitting: Oncology

## 2021-04-05 ENCOUNTER — Encounter: Payer: Self-pay | Admitting: Oncology

## 2021-04-05 ENCOUNTER — Inpatient Hospital Stay: Payer: Medicare Other

## 2021-04-05 VITALS — BP 135/93 | HR 103 | Temp 96.5°F | Resp 18 | Wt 198.3 lb

## 2021-04-05 DIAGNOSIS — R1310 Dysphagia, unspecified: Secondary | ICD-10-CM

## 2021-04-05 DIAGNOSIS — Z95828 Presence of other vascular implants and grafts: Secondary | ICD-10-CM

## 2021-04-05 DIAGNOSIS — Z931 Gastrostomy status: Secondary | ICD-10-CM | POA: Diagnosis not present

## 2021-04-05 DIAGNOSIS — C109 Malignant neoplasm of oropharynx, unspecified: Secondary | ICD-10-CM

## 2021-04-05 DIAGNOSIS — G893 Neoplasm related pain (acute) (chronic): Secondary | ICD-10-CM | POA: Diagnosis not present

## 2021-04-05 DIAGNOSIS — Z8052 Family history of malignant neoplasm of bladder: Secondary | ICD-10-CM | POA: Diagnosis not present

## 2021-04-05 DIAGNOSIS — Z8042 Family history of malignant neoplasm of prostate: Secondary | ICD-10-CM | POA: Diagnosis not present

## 2021-04-05 DIAGNOSIS — Z808 Family history of malignant neoplasm of other organs or systems: Secondary | ICD-10-CM | POA: Diagnosis not present

## 2021-04-05 DIAGNOSIS — Z803 Family history of malignant neoplasm of breast: Secondary | ICD-10-CM | POA: Diagnosis not present

## 2021-04-05 NOTE — Progress Notes (Signed)
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  S/p PEG placement on 09/24/20.  Patient has stopped piqray due to side effects.    Met with patient after clinic visit with MD.  Patient reports that Joy[facility] Draft is not working and Dr Tasia Catchings trying to get him in on clinic trial at [facility].  Patient reports that swallowing is improved.  Noted weight gain.  Patient reports has not been exercising as before (last 3 months) because has had no energy from piqray.  Reports that he has been sitting around eating junk food (cookies).  Swallowing is better. Eating about 2 meals per day (flounder and shrimp plate, lasagna, sausage biscuit).  Has been giving 4-5 feedings.  Also reports that scales at home are about 10 lb off.       Medications: reviewed  Labs: reviewed  Anthropometrics:   Weight 198 lb 4.8 oz today (with shoes, keys, wallet in pocket).    RD re-weighed patient and took shoes off and everything out of pockets  195 lb 4 oz  188 lb on 3/31 185 lb 3.2 oz on 3/3 182 lb on 1/18   Estimated Energy Needs  Kcals: 2100-2500 Protein: 105-125 g Fluid: > 2.1 L  NUTRITION DIAGNOSIS: Inadequate oral intake improving   INTERVENTION:  Discussed with patient that can cut back feeding to 3-4 per day of [employer] with oral intake improving.  Patient will monitor weight at least weekly at home and will adjust tube feeding with weight and oral intake Patient hoping to get back to walking and being more active.  Patient knows to call RD with questions or concerns    MONITORING, EVALUATION, GOAL: weight trends, intake, tube feeding   NEXT VISIT: phone call in ~ 4 weeks  Davetta Olliff [name], [place], [place] Registered Dietitian [phone] (mobile)

## 2021-04-05 NOTE — Progress Notes (Signed)
Swallowing is improving and able to eat now.  Doing 4 tube feedings a day when he has a good appetite and 5 feedings a day when he does not have a good appetite (10 lb wt gain since last documented wt).

## 2021-04-05 NOTE — Progress Notes (Signed)
Hematology/Oncology  Follow Up note [facility] [phone] [phone]   Patient Care Team: Albina Billet, MD as PCP - General (Internal Medicine) Albina Billet, MD (Internal Medicine) Bary Castilla, Forest Gleason, MD (General Surgery) Noreene Filbert, MD as Referring Physician (Radiation Oncology) Earlie Server, MD as Consulting Physician (Oncology)  CHIEF COMPLAINTS/PURPOSE OF CONSULTATION:  Follow up for head and neck cancer.  HISTORY OF PRESENTING ILLNESS:  Ivan Garcia. is a  65 y.o.  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 P16 positive. cT2 cN2 disease, stage II, # 12/13/2017: PET whole body: Primary hypermetabolic mucosal lesion in the left oropharynx/tongue base with right-sided bulky multilevel lymphadenopathy. #12/31/2017. Concurrent ChemoRT [Cisplatin 100 mg/m2 q3weeks]  S/p one dose of Cisplatin. Cisplatin discontinued due to nephrotoxicity/AKI . switch to weekly Carboplatin (AUC 2) / Taxol 7m/m2 [ finished May 2019] # 03/08/2018: PET image skull base to midthigh restaging: Near complete resolution of prior large right neck mass. Small right cervical/supraclavicular nodal metastases measuring up to 10 mm short axis. No evidence of distant metastases.  # 07/02/2018 PET No evidence of residual carcinoma in the neck. No evidence of nodal metastasis. No evidence distant metastatic disease  # July 2020, he started to have hoarseness after singing for 2 to 3 hours. He has changed his insurance and Dr. MTami Ribasis no longer covered by his current insurance.   08/04/2019 [place] care [place] [place] [place] [facility] Flexible laryngoscopy showed Vocal cord paralysis.  Patient was recommended to have a PET scan done for restaging. Patient's primary care provider Dr. THall Busingordered a PET scan for  patient which was done on 08/20/2019. 08/20/2019 PET scan showed asymmetric hypermetabolic  involving the left vocal cords with hypermetabolic adenopathy in the neck, and the chest.  He also has hypermetabolic pulmonary nodule along the minor fissure which is felt to be metastatic.  # 09/08/2019   ultrasound-guided right supraclavicular lymph node biopsy Pathology was positive for metastatic squamous cell carcinoma. # 09/08/2019 Omniseq NGS showed PD-L1 CPS 20, TMB indeterminant, MSI stable,PIK3CA [id number] # 09/29/2019- 01/2020  6 cycles of carboplatin, 5-FU and Keytruda, finished in February 2021.  #12/18/2019, PET/CT: marked improvement in met disease, continued on Keytruda maintenance. # 04/26/2020 PET worsening of disease in the chest.  Interval enlargement of the right thoracic inlet and paratracheal lymph nodes that were previously enlarged with new activity in the right hilum corresponding to a lymph node in this location.  Also increased activity in the right upper lobe nodule. Right level 3 lymph node in the neck and anterior mediastinal nodal activity is diminished.  These areas are quite small on prior study. Diminished symmetric uptake within the left as compared to the right vocal cord. # [facility].  Patient was offered for clinical trial with pembrolizumab/lenvatinib versus standard therapy.  05/14/2021 Patient received additional 1 cycle of Keytruda while waiting to be enrolled to the trial.   #06/24/2020, [facility] right upper paratracheal node is concerning for metastatic disease and a new from 01/20/2020 PET/CT.  The node closely abuts and possibly invade the posterolateral right tracheal wall as well as the esophagus.  Right vocal cord paralysis.  Symmetric soft tissue density at the right base of  tongue could reflect residual/recurrent primary malignancy versus posttreatment changes.   Right supraclavicular lymph node is no longer appreciated with post treatment changes.  There is increased size of right minor fissure nodule which was metabolically avid on previous PET scan.  New mediastinal and hilar lymphadenopathy concerning for new nodal disease.  No evidence of metastatic disease below the diaphragm.  Nonobstructing 3 mm distal ureter stone.- Disease progression.  Not eligible for the clinical trial at [facility]  # 07/07/2020 09/02/2020 palliative chemotherapy with 3 cycles of docetaxel  with G-CSF support.   # 09/02/2020,chest without contrast with 3D MIPS protocol showed Interval increase in the size of the right upper lobe perifissural nodule 1.6 x 1.5 cm-previously 1.4 x 1.2 cm tracheoesophageal soft tissue mass 3.4 x 5.0 cm with regional mass-effect on the esophagus-previously 2.4 x 3.6 cm, and mediastinal lymphadenopathy-right upper paratracheal lymph node 1.9 cm-previously 1.5 cm, suspicious for progressive metastatic disease.Subcentimeter lung nodules are stable. CT neck with contrast showed slight interval increase in size of upper right paratracheal nodal conglomerate.  Questionable soft tissue invasion of the posterior tracheal appears worsened.  Compression with potential invasion of the esophagus is again noted.  Prior vocal cord paralysis again noted.  Unchanged right tongue base asymmetry. I had a discussion with patient's [facility] oncologist Dr. Barrington Ellison over the phone. Dr.Choe recommended adding carboplatin AUC 5 to docetaxel regimen for now which hopefully to help to reduce the size of the tracheoesophageal soft tissue mass.  Also recommend palliative radiation. # 09/07/2020, carboplatin AUC 5+ docetaxel # 10/21/2021S/p PEG tube placement   #09/28/2020- 10/25/20 patient finished palliative radiation to esophageal mass. #Patient establish care with [facility]: Lambert Rhonda W Dr. Maxie Better   09/29/2020  guardant 360 which showed PIK3CA E545K, PIK3CA amplification. MSI stable, APC E2172K VUS , APC  L2138L, FGFR2 R573 VUS   . # 11/09/2020, patient had a PET scan which showed partial response Right thoracic inlet Hypermetabolic soft tissue metastasis, 3.9 x 2.6cm, decreased in size, decreased size of  Hypermetabolic right paratracheal 1.2cm, and right hilar nodal Metastases since outside 09/02/2020 neck CT Right upper lobe lung nodule 1.2cm, stable size.  RadOnc Dr. Baruch Gouty recommend additional radiation. #11/23/20-12/17/19 patient finished additional radiation # 12/22/2020,Piqray 345m daily  discontinued on 03/31/2021  INTERVAL HISTORY TCarlisle EnkeHWillett Lefeber is a 65y.o. male who presents for follow-up for metastatic squamous cell carcinoma, P 16+.   Patient has been on  Piqray 3072mdaily. since 12/22/2020, discontinued on 03/31/2021, due to neck and face pain, fatigue swallowing pain. Today he reports that his symptoms have resolved.  Patient has PEG tube. Swallowing is better, he eats 2 meals per day.  No fever, chills, shortness of breath. He has gained 10 pounds.   Review of Systems  Constitutional: Negative for chills, fever, malaise/fatigue and weight loss.  HENT: Negative for sore throat.        Dysphagia has improved.   Eyes: Negative for redness.  Respiratory: Negative for cough, shortness of breath and wheezing.   Cardiovascular: Negative for chest pain, palpitations and leg swelling.  Gastrointestinal: Negative for abdominal pain, blood in stool, nausea and vomiting.  Genitourinary: Negative for dysuria.  Musculoskeletal: Negative for myalgias.  Skin: Negative for rash.  Neurological: Negative for dizziness, tingling and tremors.  Endo/Heme/Allergies: Does not bruise/bleed easily.  Psychiatric/Behavioral: Negative for hallucinations.    MEDICAL HISTORY:  Past Medical History:  Diagnosis Date  . Arthritis   . Cancer ([facility]   Head and neck cancer  . Hemorrhoids   .  Hyperlipidemia   . Hypertension     SURGICAL HISTORY: Past Surgical History:  Procedure  Laterality Date  . COLONOSCOPY WITH PROPOFOL N/A 04/04/2017   Procedure: COLONOSCOPY WITH PROPOFOL;  Surgeon: Robert Bellow, MD;  Location: [facility] ENDOSCOPY;  Service: Endoscopy;  Laterality: N/A;  . IR GASTROSTOMY TUBE MOD SED  09/24/2020  . MANDIBLE SURGERY    . PORT-A-CATH REMOVAL  07/17/2018  . PORTA CATH INSERTION N/A 12/19/2017   Procedure: PORTA CATH INSERTION;  Surgeon: Algernon Huxley, MD;  Location: [place] CV LAB;  Service: Cardiovascular;  Laterality: N/A;  . PORTA CATH INSERTION N/A 07/17/2018   Procedure: PORTA CATH INSERTION;  Surgeon: Algernon Huxley, MD;  Location: [place] CV LAB;  Service: Cardiovascular;  Laterality: N/A;  . PORTA CATH INSERTION N/A 09/18/2019   Procedure: PORTA CATH INSERTION;  Surgeon: Algernon Huxley, MD;  Location: [place] [facility];  Service: Cardiovascular;  Laterality: N/A;  . TONSILLECTOMY      SOCIAL HISTORY: Social History   Socioeconomic History  . Marital status: Single    Spouse name: Not on file  . Number of children: Not on file  . Years of education: Not on file  . Highest education level: Not on file  Occupational History  . Not on file  Tobacco Use  . Smoking status: Never Smoker  . Smokeless tobacco: Never Used  Vaping Use  . Vaping Use: Never used  Substance and Sexual Activity  . Alcohol use: No  . Drug use: No  . Sexual activity: Not on file  Other Topics Concern  . Not on file  Social History Narrative  . Not on file   Social Determinants of Health   Financial Resource Strain: Not on file  Food Insecurity: Not on file  Transportation Needs: Not on file  Physical Activity: Not on file  Stress: Not on file  Social Connections: Not on file  Intimate Partner Violence: Not on file    FAMILY HISTORY: Family History  Problem Relation Age of Onset  . Breast cancer Mother   . Asthma Mother   . Congestive Heart Failure Mother   . Prostate cancer Father   . Brain cancer Father   . Bladder Cancer Father      ALLERGIES:  has No Known Allergies.  MEDICATIONS:  Current Outpatient Medications  Medication Sig Dispense Refill  . amLODipine (NORVASC) 5 MG tablet Take 5 mg by mouth daily.    Marland Kitchen atorvastatin (LIPITOR) 20 MG tablet SMARTSIG:1 Tablet(s) By Mouth Every Evening    . lidocaine-prilocaine (EMLA) cream Apply to affected area once 30 g 3  . omeprazole (PRILOSEC) 20 MG capsule Take 1 capsule (20 mg total) by mouth daily. 90 capsule 1  . alpelisib (PIQRAY, 300 MG DAILY DOSE,) 2 x 150 MG Therapy Pack Take 300 mg once a day. (Patient not taking: Reported on 04/05/2021) 56 each 3  . morphine (ROXANOL) 20 MG/ML concentrated solution Take 1 mL (20 mg total) by mouth every 4 (four) hours as needed for moderate pain or severe pain. (Patient not taking: No sig reported) 30 mL 0  . nystatin (MYCOSTATIN) 100000 UNIT/ML suspension Take 5 mLs (500,000 Units total) by mouth in the morning, at noon, and at bedtime. SWISH AND SPIT (Patient not taking: No sig reported) 473 mL 0  . ondansetron (ZOFRAN) 8 MG tablet Take 1 tablet (8 mg total) by mouth 2 (two) times daily as needed for refractory nausea / vomiting. Start on day 3  after carboplatin chemo. (Patient not taking: No sig reported) 30 tablet 1  . prochlorperazine (COMPAZINE) 10 MG tablet Take 1 tablet (10 mg total) by mouth every 6 (six) hours as needed (Nausea or vomiting). (Patient not taking: No sig reported) 30 tablet 1   No current facility-administered medications for this visit.   Facility-Administered Medications Ordered in Other Visits  Medication Dose Route Frequency Provider Last Rate Last Admin  . sodium chloride flush (NS) 0.9 % injection 10 mL  10 mL Intravenous PRN Earlie Server, MD      . sodium chloride flush (NS) 0.9 % injection 10 mL  10 mL Intravenous PRN Earlie Server, MD   10 mL at 10/13/20 1034     PHYSICAL EXAMINATION: ECOG PERFORMANCE STATUS: 1 - Symptomatic but completely ambulatory Vitals:   04/05/21 1017  BP: (!) 135/93  Pulse: (!)  103  Resp: 18  Temp: (!) 96.5 F (35.8 C)   Physical Exam Constitutional:      General: He is not in acute distress.    Appearance: He is not diaphoretic.  HENT:     Head: Normocephalic and atraumatic.     Nose: Nose normal.     Mouth/Throat:     Pharynx: No oropharyngeal exudate.  Eyes:     General: No scleral icterus.    Pupils: Pupils are equal, round, and reactive to light.  Neck:     Comments: Right cervical lymphadenopathy Cardiovascular:     Rate and Rhythm: Normal rate and regular rhythm.     Heart sounds: No murmur heard.   Pulmonary:     Effort: Pulmonary effort is normal. No respiratory distress.     Breath sounds: No rales.  Chest:     Chest wall: No tenderness.  Abdominal:     General: There is no distension.     Palpations: Abdomen is soft.     Tenderness: There is no abdominal tenderness.     Comments: +PEG tube  Musculoskeletal:        General: Normal range of motion.     Cervical back: Normal range of motion and neck supple.  Lymphadenopathy:     Cervical: Cervical adenopathy present.  Skin:    General: Skin is warm and dry.     Findings: No erythema.  Neurological:     Mental Status: He is alert and oriented to person, place, and time.     Cranial Nerves: No cranial nerve deficit.     Motor: No abnormal muscle tone.     Coordination: Coordination normal.  Psychiatric:        Mood and Affect: Affect normal.      LABORATORY DATA:  I have reviewed the data as listed Lab Results  Component Value Date   WBC 8.0 04/01/2021   HGB 15.6 04/01/2021   HCT 46.4 04/01/2021   MCV 91.9 04/01/2021   PLT 270 04/01/2021   Recent Labs    08/17/20 0804 08/23/20 1414 08/31/20 1244 09/07/20 0833 02/03/21 0928 03/03/21 0923 04/01/21 0901  NA 139 137 137   < > 140 139 137  K 4.0 3.6 4.5   < > 4.4 4.2 4.0  CL 105 100 98   < > 100 98 99  CO2 _0 < > _1 GLUCOSE 101* 129* 134*   < > 106* 103* 102*  BUN 23 21 25*   < > 24* 29* 25*   CREATININE 1.52* 1.50* 1.64*   < >  1.26* 1.27* 1.18  CALCIUM 9.2 8.9 9.3   < > 9.4 9.3 9.4  GFRNONAA 47* 48* 43*   < > >60 >60 >60  GFRAA 55* 56* 50*  --   --   --   --   PROT 7.4 7.0 7.3   < > 7.4 7.2 7.3  ALBUMIN 3.5 3.5 3.5   < > 4.1 4.0 4.3  AST _0 < > _1 ALT _2 < > _3 ALKPHOS 82 93 103   < > 81 66 61  BILITOT 0.7 0.8 0.6   < > 0.9 0.9 1.1   < > = values in this interval not displayed.  RADIOGRAPHIC STUDIES: I have personally reviewed the radiological images as listed and agreed with the findings in the report. CT SOFT TISSUE NECK W CONTRAST  Result Date: 04/01/2021 CLINICAL DATA:  Squamous cell carcinoma oropharynx. Multiple recurrences. Original diagnosis 4 years ago. EXAM: CT NECK WITH CONTRAST TECHNIQUE: Multidetector CT imaging of the neck was performed using the standard protocol following the bolus administration of intravenous contrast. CONTRAST:  67m OMNIPAQUE IOHEXOL 300 MG/ML  SOLN COMPARISON:  CT neck 01/05/2021.  PET CT 11/09/2020 FINDINGS: Pharynx and larynx: Progressive soft tissue swelling and low-density edema in the larynx and supraglottic tissues, right greater than left. Favor radiation change with progression. Direct mucosal visualization recommended. Oropharynx negative for mass. Salivary glands: No mass or edema. Atrophic changes in the submandibular gland bilaterally. Thyroid: Negative Lymph nodes: Lymph node mass at the thoracic inlet to the right of the esophagus again noted and similar. This appears to be related to tumor and was hypermetabolic on the prior PET-CT. Possible invasion of the esophagus. Margins are ill-defined and difficult to measure particularly along the esophageal border. No gross change from the prior CT. Vascular: Normal vascular enhancement. Port-A-Cath right jugular vein Limited intracranial: Negative Visualized orbits: Negative Mastoids and visualized paranasal sinuses: Mucosal edema left maxillary sinus. Remaining  sinuses clear. Skeleton: Cervical spondylosis without acute abnormality. Upper chest: Chest CT today reported separately Other: Edema in the anterior subcutaneous tissues of the neck related to radiation change. IMPRESSION: 1. Progressive enlargement of the right larynx and supraglottic tissues with low-density most compatible with edema from radiation. Correlate with direct visualization mucosa to evaluate for tumor recurrence in this area 2. Right paraesophageal lymph node mass is ill-defined and unchanged in size. Possible invasion of the esophagus. Electronically Signed   By: CFranchot GalloM.D.   On: 04/01/2021 13:49   CT CHEST W CONTRAST  Result Date: 04/01/2021 CLINICAL DATA:  Squamous cell carcinoma of the oropharynx and chest. Diagnosed 4 years ago. EXAM: CT CHEST WITH CONTRAST TECHNIQUE: Multidetector CT imaging of the chest was performed during intravenous contrast administration. CONTRAST:  726mOMNIPAQUE IOHEXOL 300 MG/ML  SOLN COMPARISON:  01/05/2021.  Neck CT from today's dictated separately. FINDINGS: Cardiovascular: Right-sided Port-A-Cath terminates at the superior caval/atrial junction. Aortic atherosclerosis. Normal heart size, without pericardial effusion. Three vessel coronary artery calcification. No central pulmonary embolism, on this non-dedicated study. Mediastinum/Nodes: Soft tissue mass at the right side of the thoracic inlet was detailed on neck CT dictated separately. Right paratracheal nodal tissue is similar at 8 mm on 16/3. Soft tissue stranding surrounding the right hilum including on 28/3, without residual well-defined adenopathy. Lungs/Pleura: Trace right pleural fluid is new since the prior CT. Presumed secretions within the dependent trachea. Inferior right upper lobe pulmonary nodule measures 1.2  by 1.3 cm on 34/4 versus 1.1 x 1.0 cm on the prior exam. A central right lower lobe pulmonary nodule measures 1.1 x 1.2 cm on 38/4 versus 5 mm on the prior exam (when remeasured).  8 mm posterior right upper lobe nodule on 21/4 is enlarged from 4 mm on image 56/6 of the prior exam. The previously described ground-glass opacities have resolved. Upper Abdomen: Segment 2-3 hepatic cyst and too small to characterize lesion are unchanged. Normal imaged portions of the spleen, pancreas, gallbladder, adrenal glands. Mild renal cortical thinning bilaterally. Upper pole right renal 4.9 cm fluid density lesion is likely a cyst. Gastrostomy tube is appropriately positioned. Musculoskeletal: No acute osseous abnormality. IMPRESSION: 1. Enlargement of right-sided pulmonary nodules, most consistent with metastasis. 2. No thoracic adenopathy. Please see neck CT for discussion of mass at the thoracic inlet. 3. Aortic atherosclerosis (ICD10-I70.0), coronary artery atherosclerosis and emphysema (ICD10-J43.9). 4. New soft tissue stranding surrounding the right hilum with resolution of adenopathy. Possibly related to interval response to radiation therapy. 5. New trace right pleural fluid. 6. Aortic atherosclerosis (ICD10-I70.0), coronary artery atherosclerosis and emphysema (ICD10-J43.9). Electronically Signed   By: Abigail Miyamoto M.D.   On: 04/01/2021 14:26   DG Fluoro Guide CV Line Right  Result Date: 02/04/2021 INDICATION: History of head neck cancer with surgical placement of a port a catheter on 09/18/2019. Patient now with difficulty aspirating from the port a catheter and as such presents for fluoroscopic guided evaluation. EXAM: FLUOROSCOPIC GUIDED PORT A CATHETER CHECK MEDICATIONS: None. CONTRAST:  None FLUOROSCOPY TIME:  12 seconds COMPLICATIONS: None immediate. TECHNIQUE: The procedure, risks, benefits, and alternatives were explained to the patient and informed written consent was obtained. A timeout was performed prior to the initiation of the procedure. The patient's chest port a catheter was accessed by the IV team. The patient was placed supine on the fluoroscopy table. A preprocedural spot  fluoroscopic image was obtained of the chest in existing port a catheter. Note was made of difficulty aspirating blood from the port a catheter. Contrast was injected via the Port a catheter and images were reviewed. The Port a catheter was flushed with a heparin dwell and de accessed. A dressing was placed. The patient tolerated the procedure well without immediate postprocedural complication. FINDINGS: Unchanged positioning of anterior chest wall jugular vein approach port a catheter with tip projected over the expected location of the superior cavoatrial junction. There was difficulty aspirating blood from the port a catheter. Contrast injection demonstrated the presence of a fibrin sheath about the distal end of the Port a catheter which was located within the mid aspect of the SVC. There is no evidence of catheter kink or fracture. No contrast extravasation. IMPRESSION: Difficulty aspirating of the Port a catheter is secondary to the presence of a fibrin sheath. Above findings were discussed with referring oncologist, Dr. Tasia Catchings, and the following options were provided: 1. Maintain the Port a catheter as is and attempt a tPA dwell realizing that the port a catheter may remain difficult for blood draws, but could be used for medication administration. 2. Attempt to perform a fibrin sheath stripping from the port a catheter tip via a right common femoral vein approach. Note, given otherwise appropriate positioning and functionality of the Port a catheter, I feel that a fibrin sheath stripping should be pursued prior to definitive Port a catheter revision and as such this was offered at this time. After discussion of potential treatment options, Dr. Tasia Catchings will perform a tPA dwell  and if unsuccessful or non durable, will refer for attempted fibrin sheath stripping. Above was discussed with the patient who is in agreement with the proposed plan care. Electronically Signed   By: Sandi Mariscal M.D.   On: 02/04/2021 14:26      ASSESSMENT & PLAN:   1. 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[place])   2. Dysphagia, unspecified type   3. Port-A-Cath in place    #Metastatic squamous cell carcinoma of oropharynx- cervical lymphadenopathy, lung metastatic disease, local invasion of esophagus. Off Piqray.  04/01/2021 CT Neck/Chest abdomen pelvis  Progressive enlargement of the right larynx and supraglottic tissues with low-density most compatible with edema from radiation. Correlate with direct visualization mucosa to evaluate for tumor recurrence in this area Right paraesophageal lymph node mass is ill-defined and unchanged in size. Possible invasion of the esophagus. Will present his case on Tumor board.  Also plan to communicate with Dr.Lycan to see if he is eligible for other clinical trials.   # Dysphagia + PEG, swallowing has improved.   # Neoplasm associated pain, no pain currently.  #Port-A-Cath in place, medi port flush Q8 weeks if not plan to use here. TBD  Follow-up: to be determined.   Earlie Server, MD, PhD Hematology Oncology [place] at [facility] 04/05/2021

## 2021-04-07 ENCOUNTER — Other Ambulatory Visit: Payer: Medicare Other

## 2021-04-07 NOTE — Progress Notes (Signed)
Tumor Board Documentation  Ivan Garcia. was presented by Dr Tasia Catchings at our Tumor Board on 04/07/2021, which included representatives from medical oncology,radiation oncology,surgical oncology,internal medicine,navigation,pathology,radiology,surgical,pharmacy,genetics,research,palliative care,pulmonology.  Ivan Garcia currently presents as a current patient,for discussion with history of the following treatments: immunotherapy,neoadjuvant chemoradiation.  Additionally, we reviewed previous medical and familial history, history of present illness, and recent lab results along with all available histopathologic and imaging studies. The tumor board considered available treatment options and made the following recommendations: Active surveillance measurements reviewed    The following procedures/referrals were also placed: No orders of the defined types were placed in this encounter.   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  Tumor board is a meeting of clinicians from various specialty areas who evaluate and discuss patients for whom a multidisciplinary approach is being considered. Final determinations in the plan of care are those of the provider(s). The responsibility for follow up of recommendations given during tumor board is that of the provider.   Today's extended care, comprehensive team conference, Ivan Garcia was not present for the discussion and was not examined.   Multidisciplinary Tumor Board is a multidisciplinary case peer review process.  Decisions discussed in the Multidisciplinary Tumor Board reflect the opinions of the specialists present at the conference without having examined the patient.  Ultimately, treatment and diagnostic decisions rest with the primary provider(s) and the patient.

## 2021-04-08 ENCOUNTER — Telehealth: Payer: Self-pay

## 2021-04-08 ENCOUNTER — Other Ambulatory Visit: Payer: Self-pay

## 2021-04-08 DIAGNOSIS — C109 Malignant neoplasm of oropharynx, unspecified: Secondary | ICD-10-CM

## 2021-04-08 MED ORDER — PIQRAY (300 MG DAILY DOSE) 2 X 150 MG PO TBPK: ORAL_TABLET | ORAL | 3 refills | Status: DC

## 2021-04-08 NOTE — Telephone Encounter (Signed)
-----   Message from Earlie Server, MD sent at 04/07/2021  4:49 PM EDT ----- Please schedule Ivan Garcia to have MRI brain w wo- head and neck cancer, headache.  Refill piqray. I ask Ivan Garcia to restart. Please call [facility] Dr Maxie Better and ask Ivan Garcia to give me a call. Thanks.  Follow up lab md 4 weeks. Cbc cmp

## 2021-04-08 NOTE — Telephone Encounter (Signed)
Please schedule MRI brain - next available date and lab/MD in 4 weeks and notify pt of appts.   I will call Dr. Bernadette Hoit office and send RF on [facility]

## 2021-04-08 NOTE — Telephone Encounter (Signed)
04/08/2021 Spoke w/ pt and got him scheduled for next available MRI on 5/8 @ 3:30, with f/u MD appts on 6/1 @ 10:15. PT confirmed appts  SRW

## 2021-04-10 ENCOUNTER — Ambulatory Visit
Admission: RE | Admit: 2021-04-10 | Discharge: 2021-04-10 | Disposition: A | Discharge status: Home / Self Care | Payer: Medicare Other | Source: Ambulatory Visit | Attending: Oncology | Admitting: Oncology

## 2021-04-10 ENCOUNTER — Other Ambulatory Visit: Payer: Self-pay

## 2021-04-10 DIAGNOSIS — C109 Malignant neoplasm of oropharynx, unspecified: Secondary | ICD-10-CM | POA: Diagnosis present

## 2021-04-10 IMAGING — MR MR HEAD WO/W CM
14 series · 48 of 48 positions shown · IV contrast (9ml Gadavist)
Comparison: None similar

CLINICAL DATA: History of head neck cancer with headache.

EXAM:
MRI HEAD WITHOUT AND WITH CONTRAST
TECHNIQUE: Multiplanar, multiecho pulse sequences of the brain and surrounding
structures were obtained without and with intravenous contrast.
CONTRAST:  9mL GADAVIST GADOBUTROL 1 MMOL/ML IV SOLN

[Series 5: ax dwi_tracew · axial · 3.0mm · 0.65mm/px · z∈[-51,+102]mm · 3 of 48 slices shown]
[im 1/48]
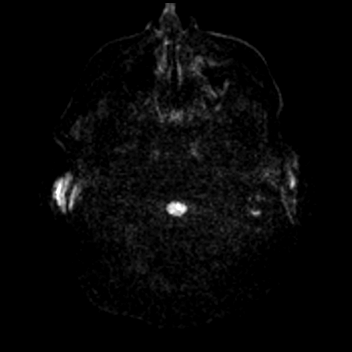
[im 24/48]
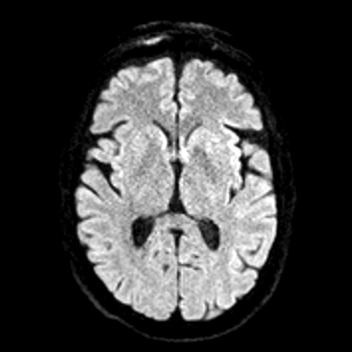
[im 48/48]
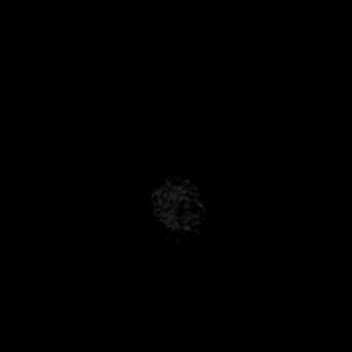

[Series 6: ax dwi_adc · axial · 3.0mm · 0.65mm/px · z∈[-51,+102]mm · 3 of 48 slices shown]
[im 1/48]
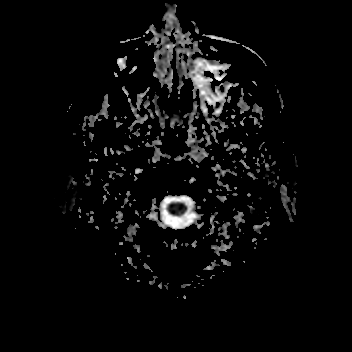
[im 24/48]
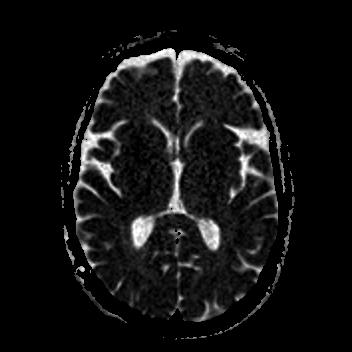
[im 48/48]
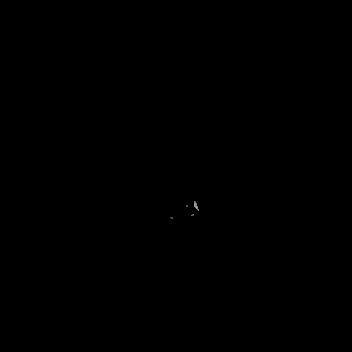

[Series 7: cor dwi_tracew · coronal · 5.0mm · 0.68mm/px · 2 of 40 slices shown]
[im 1/40]
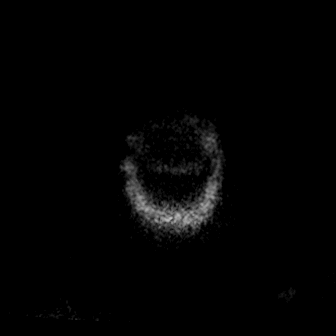
[im 40/40]
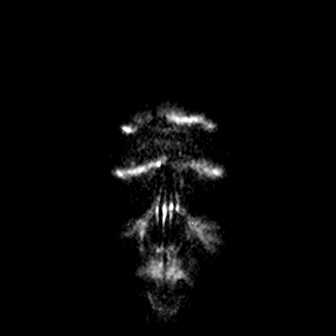

[Series 8: cor dwi_adc · coronal · 5.0mm · 0.68mm/px · 2 of 40 slices shown]
[im 1/40]
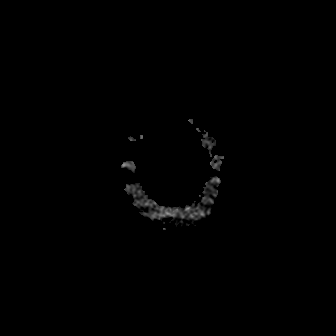
[im 40/40]
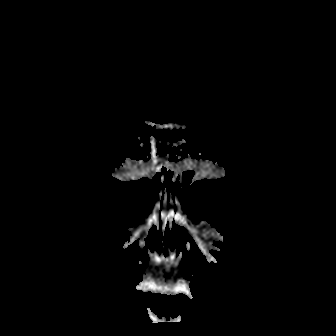

[Series 9: T1 · sagittal · 5.0mm · 0.62mm/px · 1 of 23 slices shown (1 of 2)]
[im 1/23]
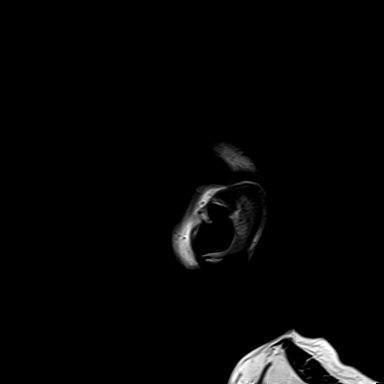

[Series 10: T2 · axial · 5.0mm · 0.53mm/px · 1 of 27 slices shown]
[im 1/27]
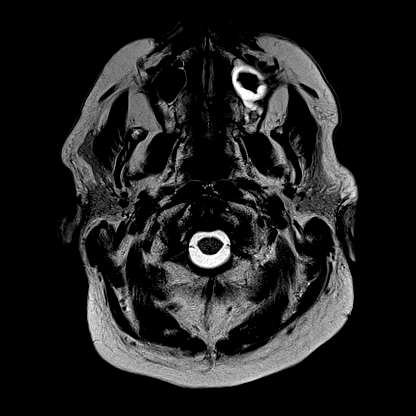

[Series 12: pha_images · axial · 3.0mm · 0.90mm/px · z∈[-63,+111]mm · 3 of 58 slices shown]
[im 1/58]
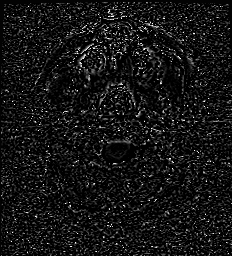
[im 29/58]
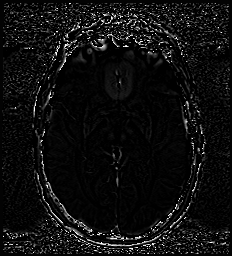
[im 58/58]
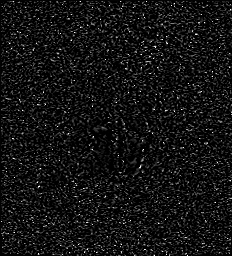

[Series 13: swi_images · axial · 3.0mm · 0.90mm/px · z∈[-63,+111]mm · 3 of 60 slices shown]
[im 1/60]
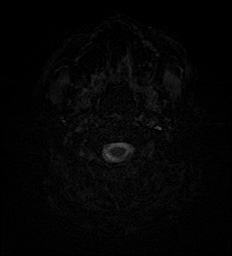
[im 30/60]
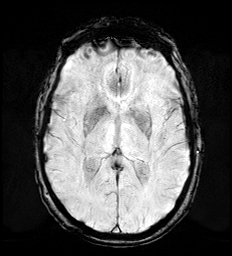
[im 60/60]
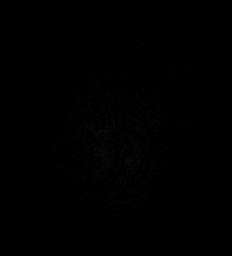

[Series 15: FLAIR · axial · 3.0mm · 0.53mm/px · z∈[-57,+103]mm · 3 of 55 slices shown]
[im 1/55]
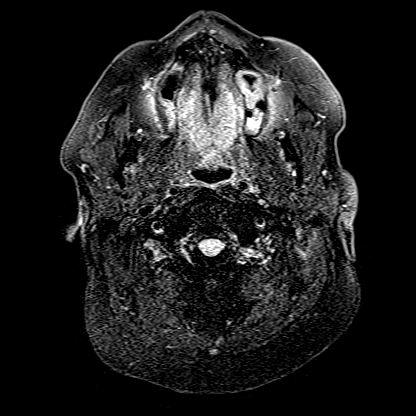
[im 28/55]
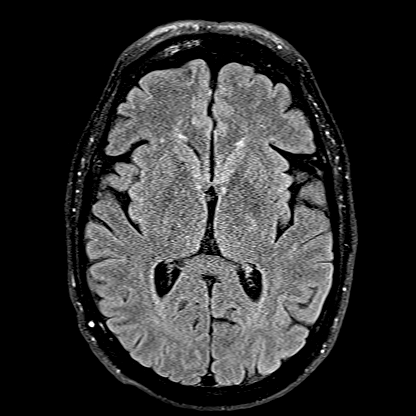
[im 55/55]
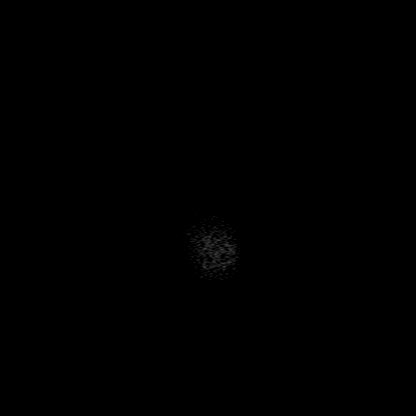

[Series 16: T1 · axial · 1.0mm · 0.98mm/px · z∈[-72,+116]mm · 11 of 191 slices shown (2 of 2)]
[im 1/191]
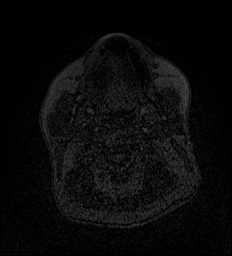
[im 20/191]
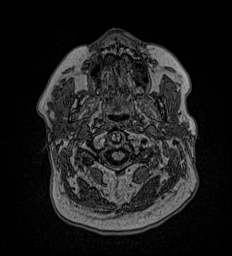
[im 39/191]
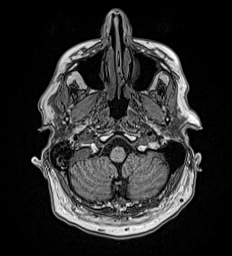
[im 58/191]
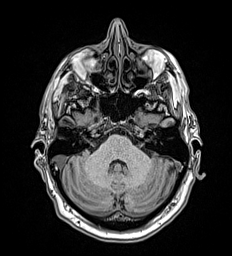
[im 77/191]
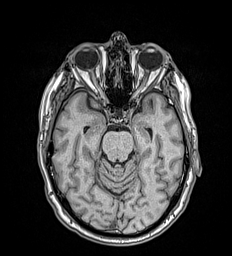
[im 96/191]
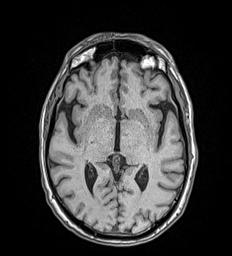
[im 115/191]
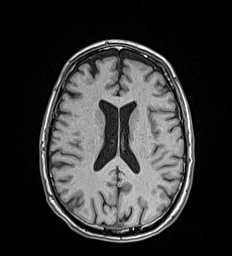
[im 134/191]
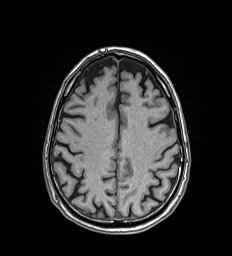
[im 153/191]
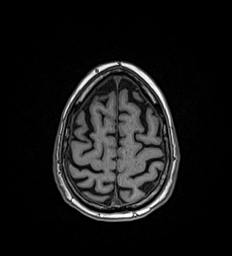
[im 172/191]
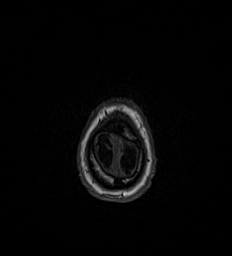
[im 191/191]
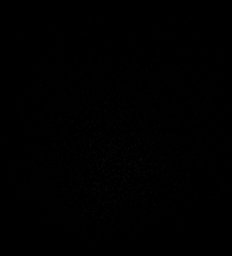

[Series 17: T2 post-contrast · coronal · 5.0mm · 0.57mm/px · 2 of 31 slices shown]
[im 1/31]
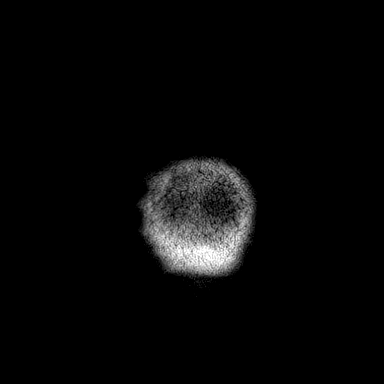
[im 31/31]
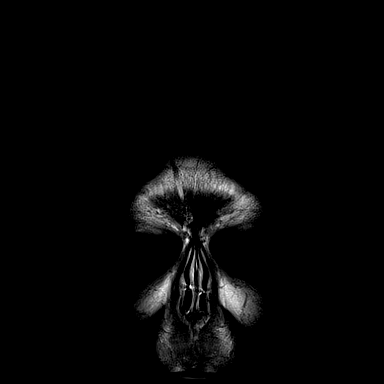

[Series 18: T1 post-contrast · axial · 1.0mm · 0.98mm/px · z∈[-72,+116]mm · 11 of 192 slices shown (1 of 3)]
[im 1/192]
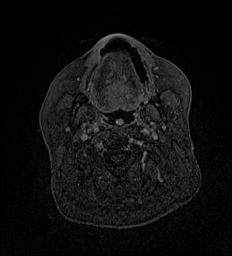
[im 20/192]
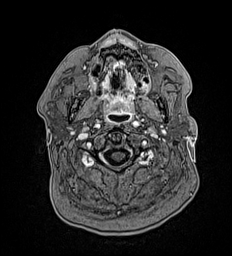
[im 39/192]
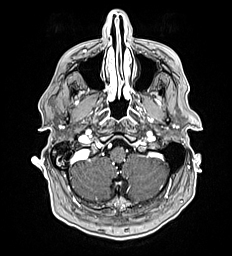
[im 58/192]
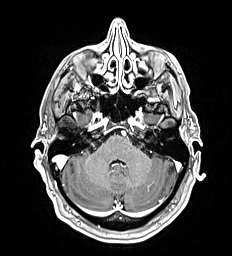
[im 77/192]
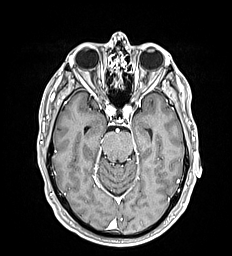
[im 96/192]
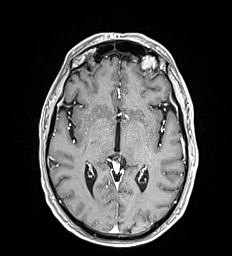
[im 115/192]
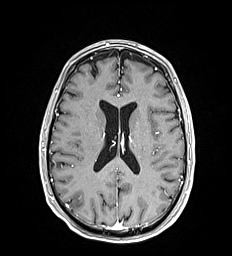
[im 134/192]
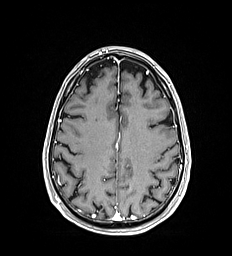
[im 153/192]
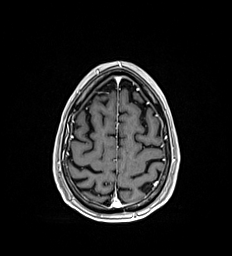
[im 172/192]
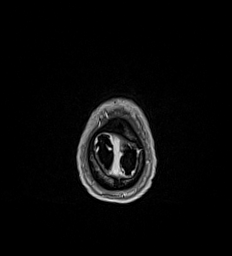
[im 192/192]
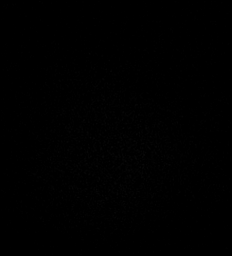

[Series 19: T1 post-contrast · coronal · 5.0mm · 0.57mm/px · 2 of 31 slices shown (2 of 3)]
[im 1/31]
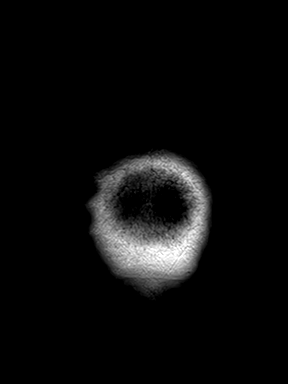
[im 31/31]
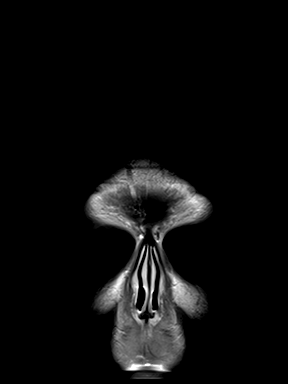

[Series 20: T1 post-contrast · sagittal · 5.0mm · 0.62mm/px · 1 of 23 slices shown (3 of 3)]
[im 1/23]
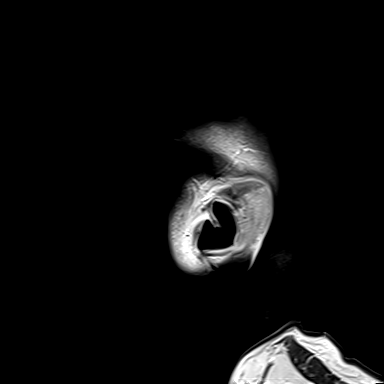

[48 of 48 positions shown; findings below may reference images not displayed]

FINDINGS: Brain: No acute for subacute infarction, hemorrhage, hydrocephalus,
extra-axial collection or mass lesion. No swelling or enhancement to
suggest metastatic disease.

Vascular: Normal flow voids and vascular enhancements

Skull and upper cervical spine: Normal marrow signal. Accentuated
fatty marrow likely from radiation therapy

Sinuses/Orbits: Mild mucosal thickening in the left maxillary sinus.
Partial right mastoid opacification. These changes are chronic and
may be post treatment.
IMPRESSION: No evidence of metastatic disease.

## 2021-04-10 MED ORDER — GADOBUTROL 1 MMOL/ML IV SOLN
10.0000 mL | Freq: Once | INTRAVENOUS | Status: AC | PRN
  Administered 2021-04-10: 9 mL via INTRAVENOUS

## 2021-04-25 ENCOUNTER — Other Ambulatory Visit: Payer: Self-pay

## 2021-04-25 ENCOUNTER — Ambulatory Visit
Admission: RE | Admit: 2021-04-25 | Discharge: 2021-04-25 | Disposition: A | Discharge status: Home / Self Care | Payer: Medicare Other | Source: Ambulatory Visit | Attending: Oncology | Admitting: Oncology

## 2021-04-25 DIAGNOSIS — R200 Anesthesia of skin: Secondary | ICD-10-CM | POA: Diagnosis present

## 2021-04-25 DIAGNOSIS — C109 Malignant neoplasm of oropharynx, unspecified: Secondary | ICD-10-CM | POA: Diagnosis present

## 2021-04-25 IMAGING — MR MR CERVICAL SPINE WO/W CM
5 of 9 series · 27 of 48 positions shown · IV contrast (gadavist)
Comparison: Neck CT scan [DATE].

CLINICAL DATA: History of metastatic oropharyngeal squamous cell
carcinoma.

EXAM:
MRI CERVICAL AND THORACIC SPINE WITHOUT AND WITH CONTRAST
TECHNIQUE: Multiplanar and multiecho pulse sequences of the cervical spine, to
include the craniocervical junction and cervicothoracic junction,
and the thoracic spine, were obtained without and with intravenous
contrast.
CONTRAST:  9 mL GADAVIST IV SOLN

[Series 1: T2 · sagittal · 3.0mm · 0.62mm/px · 3 of 15 slices shown (1 of 2)]
[im 1/15]
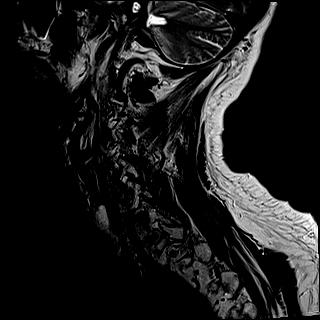
[im 8/15]
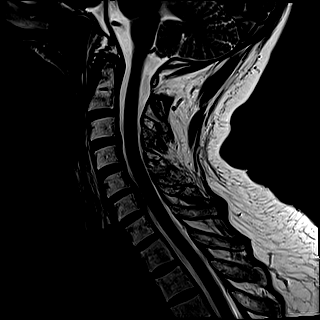
[im 15/15]
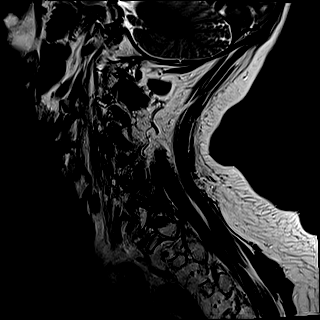

[Series 3: STIR · sagittal · 3.0mm · 0.62mm/px · 3 of 15 slices shown]
[im 1/15]
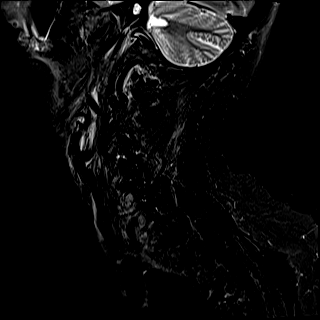
[im 8/15]
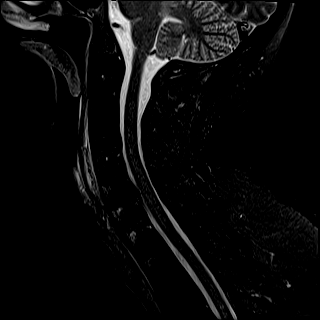
[im 15/15]
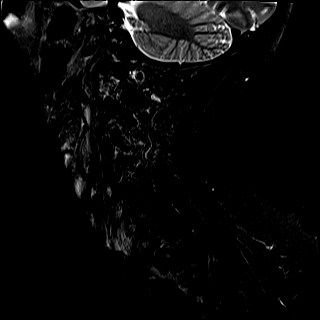

[Series 4: T2 · axial · 3.0mm · 0.70mm/px · z∈[-3,+108]mm · 8 of 33 slices shown (2 of 2)]
[im 1/33]
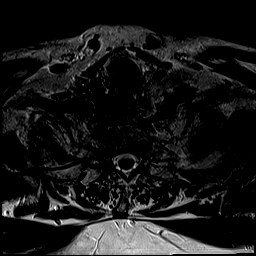
[im 5/33]
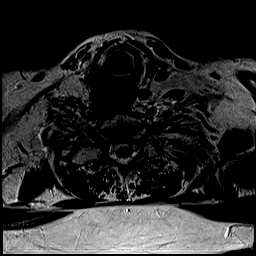
[im 10/33]
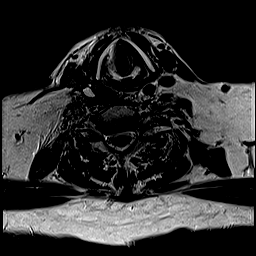
[im 14/33]
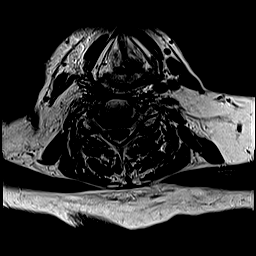
[im 19/33]
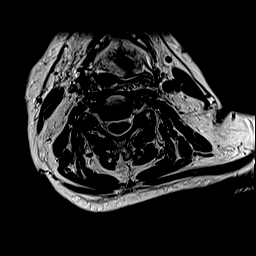
[im 23/33]
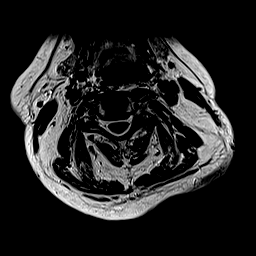
[im 28/33]
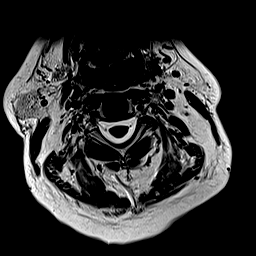
[im 33/33]
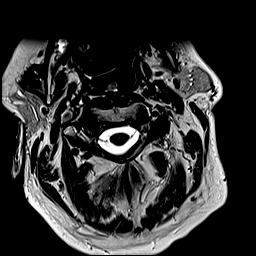

[Series 6: T1 · axial · non-contrast · 3.0mm · 0.35mm/px · z∈[-3,+108]mm · 8 of 33 slices shown]
[im 1/33]
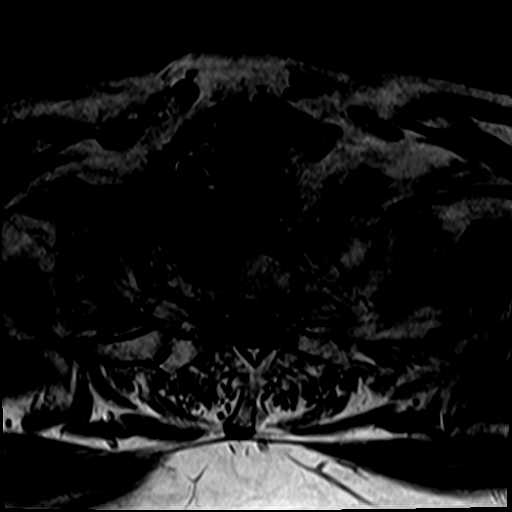
[im 5/33]
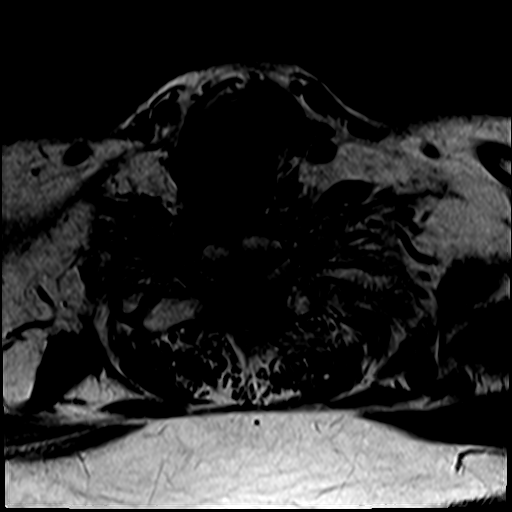
[im 10/33]
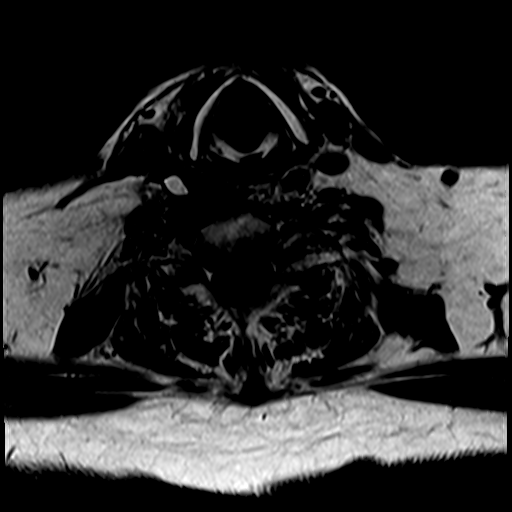
[im 14/33]
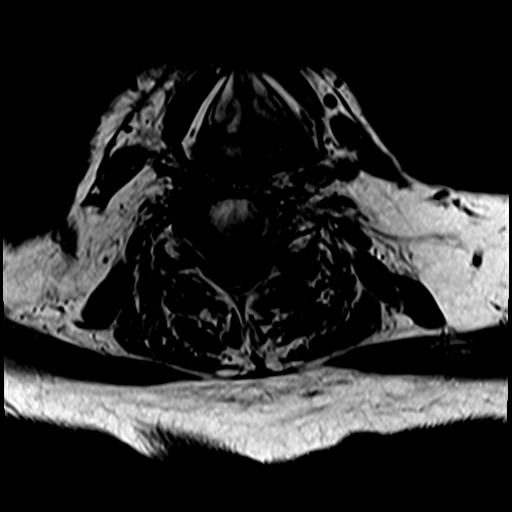
[im 19/33]
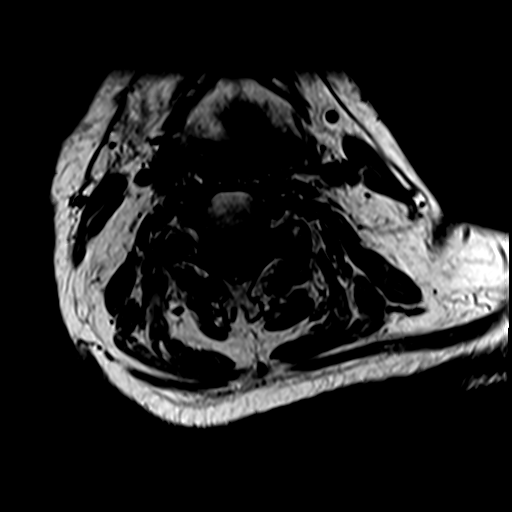
[im 23/33]
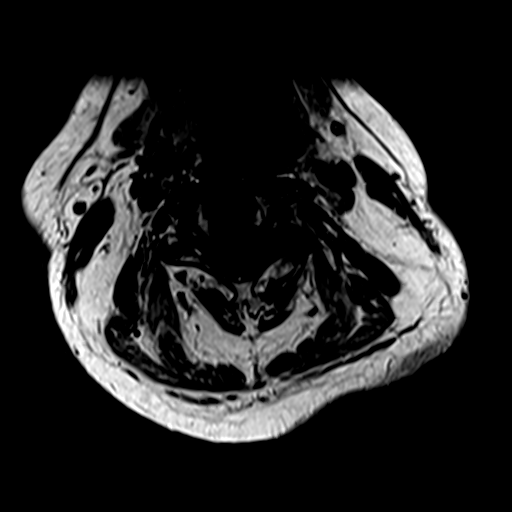
[im 28/33]
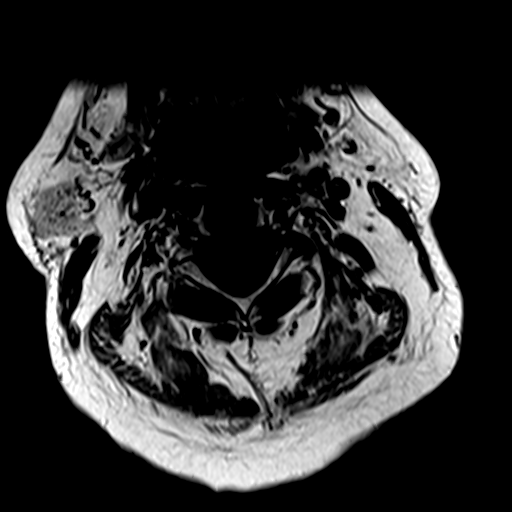
[im 33/33]
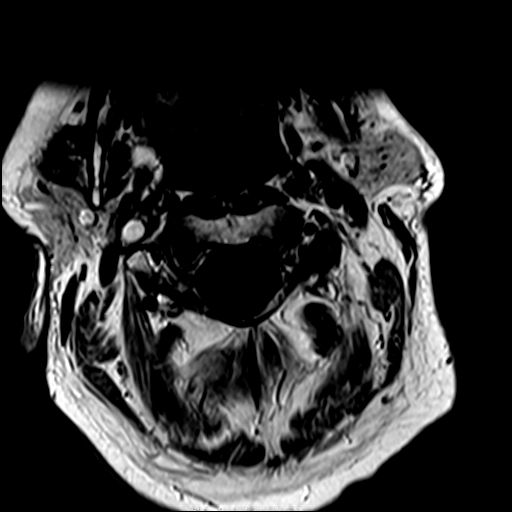

[Series 20: T1 post-contrast · axial · 3.0mm · 0.35mm/px · z∈[-263,-202]mm · 5 of 33 slices shown]
[im 1/33]
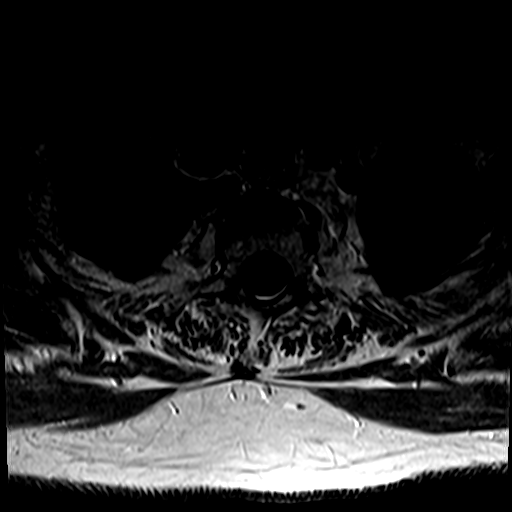
[im 5/33]
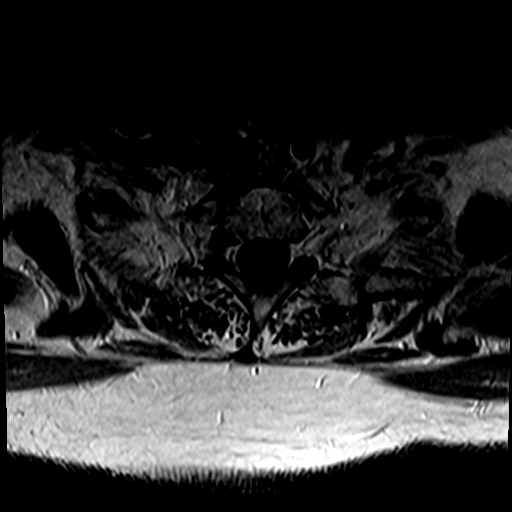
[im 10/33]
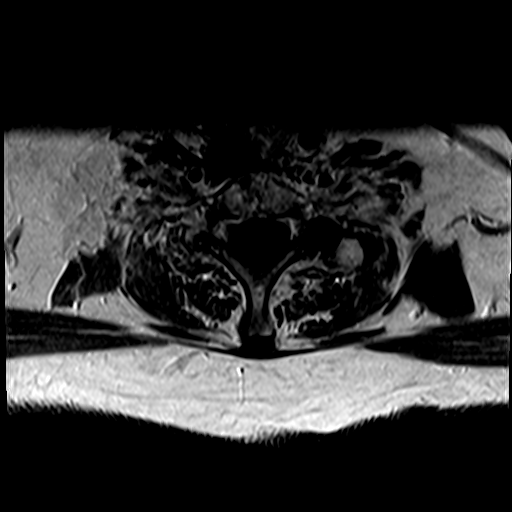
[im 14/33]
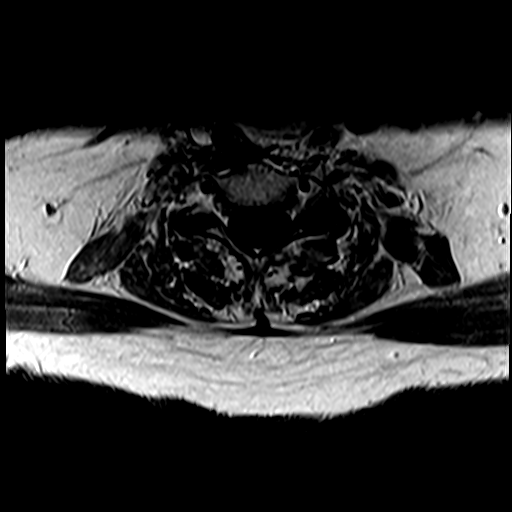
[im 19/33]
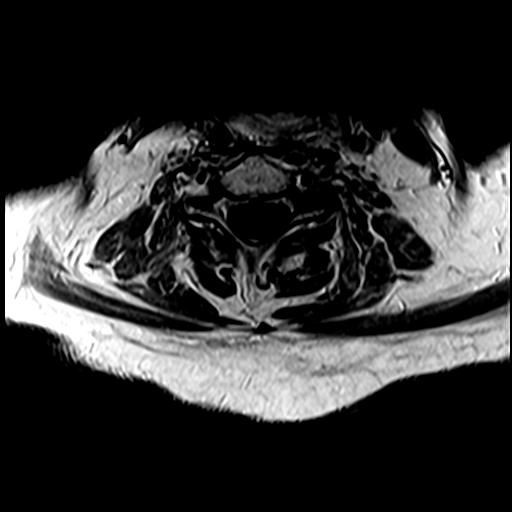

[27 of 48 positions shown; findings below may reference images not displayed]

FINDINGS: MRI CERVICAL SPINE FINDINGS

Alignment: Normal.

Vertebrae: No fracture. Edema and enhancement are seen in the
anterior aspects of the C5 and C6 vertebral bodies, more prominent
to the right. More extensive edema and enhancement are seen in the
anterior aspect of C7 eccentric to the right.

Cord: Cord signal is normal. No pathologic enhancement after
contrast administration. No epidural tumor is seen.

Posterior Fossa, vertebral arteries, paraspinal tissues: Soft tissue
thickening and enhancement anterior to the cervical spine is noted
but better seen on the prior neck CT.

Disc levels:

C2-3: Negative.

C3-4: Mild disc bulge and uncovertebral disease. Mild to moderate
facet degenerative disease is worse on the left.

C4-5: Mild-to-moderate facet arthropathy and a shallow disc bulge.
No stenosis.

C5-6: There is a disc osteophyte complex and uncovertebral spurring.
Mild central canal stenosis is present. Mild to moderate foraminal
narrowing bilaterally.

C6-7: Shallow disc bulge and uncovertebral disease. The central
canal and left foramen are open. Mild to moderate right foraminal
narrowing.

C7-T1: Negative.

MRI THORACIC SPINE FINDINGS

Alignment:  Normal.

Vertebrae: There is a small area of marrow edema and enhancement in
the anterior aspect of T1 centrally and to the right. Marrow signal
is otherwise normal.

Cord: Normal signal throughout. No pathologic enhancement after
contrast administration. No epidural tumor.

Paraspinal and other soft tissues: Please see report of dedicated
chest CT [DATE]. Small right pleural effusion noted.

Disc levels:

The central canal and foramina are widely patent at all levels. No
disc protrusion is identified.
IMPRESSION: Abnormal edema and enhancement in the anterior aspect of C5, C6, C7
and T1 are most conspicuous at C7 and most consistent with
metastatic disease.

Soft tissue thickening and enhancement anterior to the mid and lower
cervical spine is noted as reported on prior neck CT.

Small right pleural effusion.

Mild cervical spondylosis as described above.

## 2021-04-25 MED ORDER — GADOBUTROL 1 MMOL/ML IV SOLN
9.0000 mL | Freq: Once | INTRAVENOUS | Status: AC | PRN
  Administered 2021-04-25: 9 mL via INTRAVENOUS

## 2021-04-26 ENCOUNTER — Other Ambulatory Visit: Payer: Self-pay | Admitting: Oncology

## 2021-04-26 MED ORDER — GABAPENTIN 100 MG PO CAPS: 100.0000 mg | ORAL_CAPSULE | Freq: Every day | ORAL | 0 refills | Status: DC

## 2021-04-26 MED ORDER — MORPHINE SULFATE (CONCENTRATE) 20 MG/ML PO SOLN: 20.0000 mg | Freq: Two times a day (BID) | ORAL | 0 refills | Status: DC | PRN

## 2021-05-03 ENCOUNTER — Telehealth: Payer: Self-pay

## 2021-05-03 DIAGNOSIS — Z006 Encounter for examination for normal comparison and control in clinical research program: Secondary | ICD-10-CM | POA: Insufficient documentation

## 2021-05-03 NOTE — Telephone Encounter (Signed)
He also wanted to know if you wanted to r/s the the 6/1 appt with you.

## 2021-05-03 NOTE — Telephone Encounter (Signed)
Patient called to cancel his appt for lab and Dr.  Tasia Catchings tomorrow due to having another appt.  He would like for Dr. Tasia Catchings to know that he saw Dr. Jones Broom on 04/29/21 and is going to start a clinical trial that will be every Wednesday.    Ivan Garcia wants to know if Dr. Tasia Catchings wants to r/s his appts from 05/04/21 at this time?

## 2021-05-04 ENCOUNTER — Inpatient Hospital Stay: Payer: Medicare Other

## 2021-05-04 ENCOUNTER — Encounter: Payer: Self-pay | Admitting: Oncology

## 2021-05-04 ENCOUNTER — Inpatient Hospital Stay: Payer: Medicare Other | Admitting: Oncology

## 2021-05-04 NOTE — Telephone Encounter (Signed)
MyChart message sent to patient.

## 2021-05-05 ENCOUNTER — Emergency Department
Admission: EM | Admit: 2021-05-05 | Discharge: 2021-05-06 | Disposition: A | Discharge status: Home / Self Care | Payer: Medicare Other | Attending: Emergency Medicine | Admitting: Emergency Medicine

## 2021-05-05 ENCOUNTER — Encounter: Payer: Self-pay | Admitting: Emergency Medicine

## 2021-05-05 ENCOUNTER — Other Ambulatory Visit: Payer: Self-pay

## 2021-05-05 DIAGNOSIS — I129 Hypertensive chronic kidney disease with stage 1 through stage 4 chronic kidney disease, or unspecified chronic kidney disease: Secondary | ICD-10-CM | POA: Diagnosis not present

## 2021-05-05 DIAGNOSIS — N1831 Chronic kidney disease, stage 3a: Secondary | ICD-10-CM | POA: Insufficient documentation

## 2021-05-05 DIAGNOSIS — Z79899 Other long term (current) drug therapy: Secondary | ICD-10-CM | POA: Insufficient documentation

## 2021-05-05 DIAGNOSIS — R7881 Bacteremia: Secondary | ICD-10-CM | POA: Insufficient documentation

## 2021-05-05 DIAGNOSIS — Z85818 Personal history of malignant neoplasm of other sites of lip, oral cavity, and pharynx: Secondary | ICD-10-CM | POA: Diagnosis not present

## 2021-05-05 DIAGNOSIS — R799 Abnormal finding of blood chemistry, unspecified: Secondary | ICD-10-CM | POA: Diagnosis present

## 2021-05-05 NOTE — ED Triage Notes (Signed)
Pt reports that he was at [facility] getting cancer treatment, he got a new immunotherapy, went to the bathroom felt that he was going to pass out turned on the light and was told that he went into cardiac arrest. They discharged him and called him today and told him to go to the ED immediately because he was Septic.

## 2021-05-06 DIAGNOSIS — R7881 Bacteremia: Secondary | ICD-10-CM | POA: Diagnosis not present

## 2021-05-06 LAB — COMPREHENSIVE METABOLIC PANEL
ALT: 28 U/L (ref 0–44)
AST: 26 U/L (ref 15–41)
Albumin: 3.8 g/dL (ref 3.5–5.0)
Alkaline Phosphatase: 51 U/L (ref 38–126)
Anion gap: 8 (ref 5–15)
BUN: 24 mg/dL — ABNORMAL HIGH (ref 8–23)
CO2: 27 mmol/L (ref 22–32)
Calcium: 9 mg/dL (ref 8.9–10.3)
Chloride: 103 mmol/L (ref 98–111)
Creatinine, Ser: 1.34 mg/dL — ABNORMAL HIGH (ref 0.61–1.24)
GFR, Estimated: 59 mL/min — ABNORMAL LOW (ref >60)
Glucose, Bld: 88 mg/dL (ref 70–99)
Potassium: 4.4 mmol/L (ref 3.5–5.1)
Sodium: 138 mmol/L (ref 135–145)
Total Bilirubin: 0.6 mg/dL (ref 0.3–1.2)
Total Protein: 6.9 g/dL (ref 6.5–8.1)

## 2021-05-06 LAB — CBC WITH DIFFERENTIAL/PLATELET
Abs Immature Granulocytes: 0.05 10*3/uL (ref 0.00–0.07)
Basophils Absolute: 0 10*3/uL (ref 0.0–0.1)
Basophils Relative: 0 %
Eosinophils Absolute: 0.4 10*3/uL (ref 0.0–0.5)
Eosinophils Relative: 5 %
HCT: 35.3 % — ABNORMAL LOW (ref 39.0–52.0)
Hemoglobin: 11.8 g/dL — ABNORMAL LOW (ref 13.0–17.0)
Immature Granulocytes: 1 %
Lymphocytes Relative: 5 %
Lymphs Abs: 0.4 10*3/uL — ABNORMAL LOW (ref 0.7–4.0)
MCH: 31.7 pg (ref 26.0–34.0)
MCHC: 33.4 g/dL (ref 30.0–36.0)
MCV: 94.9 fL (ref 80.0–100.0)
Monocytes Absolute: 0.6 10*3/uL (ref 0.1–1.0)
Monocytes Relative: 7 %
Neutro Abs: 6.1 10*3/uL (ref 1.7–7.7)
Neutrophils Relative %: 82 %
Platelets: 224 10*3/uL (ref 150–400)
RBC: 3.72 MIL/uL — ABNORMAL LOW (ref 4.22–5.81)
RDW: 13.9 % (ref 11.5–15.5)
WBC: 7.6 10*3/uL (ref 4.0–10.5)
nRBC: 0 % (ref 0.0–0.2)

## 2021-05-06 LAB — URINALYSIS, COMPLETE (UACMP) WITH MICROSCOPIC
Bacteria, UA: NONE SEEN
Bilirubin Urine: NEGATIVE
Glucose, UA: NEGATIVE mg/dL
Hgb urine dipstick: NEGATIVE
Ketones, ur: NEGATIVE mg/dL
Leukocytes,Ua: NEGATIVE
Nitrite: NEGATIVE
Protein, ur: NEGATIVE mg/dL
Specific Gravity, Urine: 1.019 (ref 1.005–1.030)
pH: 5 (ref 5.0–8.0)

## 2021-05-06 LAB — TROPONIN I (HIGH SENSITIVITY): Troponin I (High Sensitivity): 14 ng/L (ref <18)

## 2021-05-06 LAB — LACTIC ACID, PLASMA: Lactic Acid, Venous: 0.8 mmol/L (ref 0.5–1.9)

## 2021-05-06 MED ORDER — HEPARIN SOD (PORK) LOCK FLUSH 100 UNIT/ML IV SOLN
500.0000 [IU] | Freq: Once | INTRAVENOUS | Status: AC
  Administered 2021-05-06: 500 [IU] via INTRAVENOUS
  Filled 2021-05-06: qty 5

## 2021-05-06 NOTE — ED Provider Notes (Signed)
[facility] Emergency Department Provider Note  ____________________________________________   Event Date/Time   First MD Initiated Contact with Patient 05/05/21 2347     (approximate)  I have reviewed the triage vital signs and the nursing notes.   HISTORY  Chief Complaint Abnormal labs   HPI Ivan Garcia. is a 65 y.o. 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  Patient has a recent complicated medical history.  It appears he received IV cetuximab 05/04/2021 at [facility] health.  He had a grade 4 infusion related reaction with syncope, PEA cardiac arrest requiring 30 to 60 seconds of CPR until ROSC was obtained.  He was given an EpiPen and administered another 1 mg of IV epinephrine with gradual return of consciousness.  Patient was awake, verbal.  He was not intubated.  He was admitted to the hospital for observation.  He was discharged on 05/05/2021.  He did have mildly elevated troponins that appeared to trend down.  CTA of the chest showed no pulmonary embolus.  CT of the abdomen pelvis showed mild wall thickening and enhancement of the colon at the hepatic flexure which could be due to nonspecific colitis including infectious, inflammatory and ischemic etiologies.  He denies any complaints today but states he was contacted this evening and told that he needed to go to the emergency department immediately because he was "septic".  He is unable to tell me what labs were abnormal.  He denies any fevers, cough, chest discomfort, shortness of breath, nausea, vomiting, diarrhea, dysuria, rash.  05/05/21 Echo:  SUMMARY  The left ventricular size is normal.  There is normal left ventricular wall thickness.  Left ventricular systolic function is normal.  LV ejection  fraction = 60-65%.  The left ventricular wall motion is normal.  Left ventricular filling pattern is indeterminate.  The right ventricle is normal in size and function.  There is no significant valvular stenosis or regurgitation.  The IVC is normal in size with an inspiratory collapse of greater then  50%, suggesting normal right atrial pressure.  There is no pericardial effusion.  There is no comparison study available.           Past Medical History:  Diagnosis Date  . Arthritis   . Cancer ([place])    Head and neck cancer  . Hemorrhoids   . Hyperlipidemia   . Hypertension     Patient Active Problem List   Diagnosis Date Noted  . 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 Bone pain 07/14/2020  . Encounter for antineoplastic immunotherapy 04/27/2020  . Stage 3a chronic kidney disease ([place]) 12/22/2019  . Encounter for antineoplastic chemotherapy 10/06/2019  . Neoplasm related pain 10/06/2019  . Stage 3 chronic kidney disease ([place]) 03/24/2018  . Edema 03/24/2018  . Port-A-Cath in place 03/24/2018  . ARF (acute renal failure) ([place]) 01/02/2018  . 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[place]) 12/14/2017  . Goals of care, counseling/discussion 12/10/2017  . Encounter for screening colonoscopy 02/28/2017    Past Surgical History:  Procedure Laterality Date  . COLONOSCOPY WITH PROPOFOL N/A 04/04/2017   Procedure: COLONOSCOPY WITH PROPOFOL;  Surgeon: Robert Bellow, MD;  Location: [facility] ENDOSCOPY;  Service: Endoscopy;  Laterality: N/A;  . IR GASTROSTOMY TUBE MOD SED  09/24/2020  . MANDIBLE SURGERY    .  PORT-A-CATH REMOVAL  07/17/2018  . PORTA CATH INSERTION N/A 12/19/2017   Procedure: PORTA CATH INSERTION;  Surgeon: Algernon Huxley, MD;  Location: [place] CV LAB;  Service: Cardiovascular;  Laterality: N/A;  . PORTA CATH INSERTION N/A 07/17/2018   Procedure: PORTA CATH INSERTION;  Surgeon: Algernon Huxley, MD;  Location: [place] CV LAB;  Service: Cardiovascular;   Laterality: N/A;  . PORTA CATH INSERTION N/A 09/18/2019   Procedure: PORTA CATH INSERTION;  Surgeon: Algernon Huxley, MD;  Location: [place] CV LAB;  Service: Cardiovascular;  Laterality: N/A;  . TONSILLECTOMY      Prior to Admission medications   Medication Sig Start Date End Date Taking? Authorizing Provider  alpelisib (PIQRAY, 300 MG DAILY DOSE,) 2 x 150 MG Therapy Pack Take 300 mg once a day. 04/08/21   Earlie Server, MD  amLODipine (NORVASC) 5 MG tablet Take 5 mg by mouth daily.    [provider]  atorvastatin (LIPITOR) 20 MG tablet SMARTSIG:1 Tablet(s) By Mouth Every Evening 01/15/21   [provider]  gabapentin (NEURONTIN) 100 MG capsule Take 1 capsule (100 mg total) by mouth at bedtime. 04/26/21   Earlie Server, MD  lidocaine-prilocaine (EMLA) cream Apply to affected area once 09/10/19   Earlie Server, MD  morphine (ROXANOL) 20 MG/ML concentrated solution Take 1 mL (20 mg total) by mouth every 12 (twelve) hours as needed for moderate pain or severe pain. 04/26/21   Earlie Server, MD  nystatin (MYCOSTATIN) 100000 UNIT/ML suspension Take 5 mLs (500,000 Units total) by mouth in the morning, at noon, and at bedtime. SWISH AND SPIT Patient not taking: No sig reported 08/17/20   Earlie Server, MD  omeprazole (PRILOSEC) 20 MG capsule Take 1 capsule (20 mg total) by mouth daily. 03/03/21   Earlie Server, MD  ondansetron (ZOFRAN) 8 MG tablet Take 1 tablet (8 mg total) by mouth 2 (two) times daily as needed for refractory nausea / vomiting. Start on day 3 after carboplatin chemo. Patient not taking: No sig reported 11/04/20   Earlie Server, MD  prochlorperazine (COMPAZINE) 10 MG tablet Take 1 tablet (10 mg total) by mouth every 6 (six) hours as needed (Nausea or vomiting). Patient not taking: No sig reported 09/10/19   Earlie Server, MD    Allergies Patient has no known allergies.  Family History  Problem Relation Age of Onset  . Breast cancer Mother   . Asthma Mother   . Congestive Heart Failure Mother   .  Prostate cancer Father   . Brain cancer Father   . Bladder Cancer Father     Social History Social History   Tobacco Use  . Smoking status: Never Smoker  . Smokeless tobacco: Never Used  Vaping Use  . Vaping Use: Never used  Substance Use Topics  . Alcohol use: No  . Drug use: No    Review of Systems Constitutional: No fever. Eyes: No visual changes. ENT: No sore throat. Cardiovascular: Denies chest pain. Respiratory: Denies shortness of breath. Gastrointestinal: No nausea, vomiting, diarrhea. Genitourinary: Negative for dysuria. Musculoskeletal: Negative for back pain. Skin: Negative for rash. Neurological: Negative for focal weakness or numbness.  ____________________________________________   PHYSICAL EXAM:  VITAL SIGNS: ED Triage Vitals  Enc Vitals Group     BP 05/05/21 2334 116/84     Pulse Rate 05/05/21 2334 94     Resp 05/05/21 2334 20     Temp 05/05/21 2334 98.3 F (36.8 C)     Temp Source 05/05/21  2334 Oral     SpO2 05/05/21 2334 92 %     Weight 05/05/21 2334 197 lb (89.4 kg)     Height 05/05/21 2334 5\' 9"  (1.753 m)     Head Circumference --      Peak Flow --      Pain Score 05/05/21 2343 0     Pain Loc --      Pain Edu? --      Excl. in [place]? --    CONSTITUTIONAL: Alert and oriented and responds appropriately to questions. Well-appearing; well-nourished, afebrile, nontoxic, well-hydrated HEAD: Normocephalic EYES: Conjunctivae clear, pupils appear equal, EOM appear intact ENT: normal nose; moist mucous membranes NECK: Supple, normal ROM CARD: RRR; S1 and S2 appreciated; no murmurs, no clicks, no rubs, no gallops RESP: Normal chest excursion without splinting or tachypnea; breath sounds clear and equal bilaterally; no wheezes, no rhonchi, no rales, no hypoxia or respiratory distress, speaking full sentences ABD/GI: Normal bowel sounds; non-distended; soft, non-tender, no rebound, no guarding, no peritoneal signs, no hepatosplenomegaly BACK: The back  appears normal EXT: Normal ROM in all joints; no deformity noted, no edema; no cyanosis, no calf tenderness or calf swelling SKIN: Normal color for age and race; warm; no rash on exposed skin NEURO: Moves all extremities equally, normal gait, normal speech PSYCH: The patient's mood and manner are appropriate.  ____________________________________________   LABS (all labs ordered are listed, but only abnormal results are displayed)  Labs Reviewed  CBC WITH DIFFERENTIAL/PLATELET - Abnormal; Notable for the following components:      Result Value   RBC 3.72 (*)    Hemoglobin 11.8 (*)    HCT 35.3 (*)    Lymphs Abs 0.4 (*)    All other components within normal limits  COMPREHENSIVE METABOLIC PANEL - Abnormal; Notable for the following components:   BUN 24 (*)    Creatinine, Ser 1.34 (*)    GFR, Estimated 59 (*)    All other components within normal limits  URINALYSIS, COMPLETE (UACMP) WITH MICROSCOPIC - Abnormal; Notable for the following components:   Color, Urine YELLOW (*)    APPearance CLEAR (*)    All other components within normal limits  CULTURE, BLOOD (ROUTINE X 2)  CULTURE, BLOOD (ROUTINE X 2)  URINE CULTURE  LACTIC ACID, PLASMA  TROPONIN I (HIGH SENSITIVITY)   ____________________________________________  EKG   EKG Interpretation  Date/Time:  Friday May 06 2021 03:09:28 EDT Ventricular Rate:  82 PR Interval:  150 QRS Duration: 99 QT Interval:  323 QTC Calculation: 378 R Axis:   61 Text Interpretation: Sinus rhythm Borderline T abnormalities, anterior leads Confirmed by Pryor Curia [phone]) on 05/06/2021 3:29:47 AM       ____________________________________________  RADIOLOGY Jessie Foot Fleet [place]am, personally viewed and evaluated these images (plain radiographs) as part of my medical decision making, as well as reviewing the written report by the radiologist.  ED MD interpretation:    Official radiology report(s): No results  found.  ____________________________________________   PROCEDURES  Procedure(s) performed (including Critical Care):  Procedures   ____________________________________________   INITIAL IMPRESSION / ASSESSMENT AND PLAN / ED COURSE  As part of my medical decision making, I reviewed the following data within the [place] notes reviewed and incorporated, Labs reviewed , EKG interpreted , Old EKG reviewed, Old chart reviewed and Notes from prior ED visits         Patient here after he was contacted by staff at [place] stating that  he was "septic".  I suspect that he had positive blood cultures but unfortunately I am not able to see these in care everywhere.  We will attempt to contact [facility] to see if we can obtain these records.  It appears he had a lactate drawn during his hospitalization that appeared normal.  He has no complaints at this time and is afebrile and hemodynamically stable.  We will repeat labs, urine, cultures today.  I do not feel he needs antibiotics currently given he is stable and vital signs do not suggest sepsis.  ED PROGRESS  2:48 AM  I was able to receive records from [facility] he had 1 blood culture that grew gram-positive cocci that was obtained at 1821 on 05/04/2021.  His other blood culture that was obtained on 05/04/2021 at [place] has not shown any growth.  I suspect that this is a contaminated sample given he is not symptomatic.  We will repeat blood cultures here today.  Labs, urine pending.    4:13 AM Pt's labs here are reassuring.  Normal white blood cell count.  Creatinine mildly elevated but stable compared to previous.  Troponin negative.  Lactic normal.  Urine shows no sign of infection.  Repeat blood cultures, urine culture pending.  Given he is asymptomatic, hemodynamically stable with reassuring work-up, I feel he is safe to be discharged.  Discussed with patient that his 1 positive blood culture at [facility] was  likely a contaminant and that repeat blood cultures here are pending.  If these are positive for bacterial growth he will be contacted and recommended he return to the emergency department at that time for admission for IV antibiotics.  Patient comfortable with this plan.  At this time, I do not feel there is any life-threatening condition present. I have reviewed, interpreted and discussed all results (EKG, imaging, lab, urine as appropriate) and exam findings with patient/family. I have reviewed nursing notes and appropriate previous records.  I feel the patient is safe to be discharged home without further emergent workup and can continue workup as an outpatient as needed. Discussed usual and customary return precautions. Patient/family verbalize understanding and are comfortable with this plan.  Outpatient follow-up has been provided as needed. All questions have been answered.  ____________________________________________   FINAL CLINICAL IMPRESSION(S) / ED DIAGNOSES  Final diagnoses:  Positive blood culture     ED Discharge Orders    None      *Please note:  Ivan Garcia. was evaluated in Emergency Department on 05/06/2021 for the symptoms described in the history of present illness. He was evaluated in the context of the global COVID-19 pandemic, which necessitated consideration that the patient might be at risk for infection with the SARS-CoV-2 virus that causes COVID-19. Institutional protocols and algorithms that pertain to the evaluation of patients at risk for COVID-19 are in a state of rapid change based on information released by regulatory bodies including the CDC and federal and state organizations. These policies and algorithms were followed during the patient's care in the ED.  Some ED evaluations and interventions may be delayed as a result of limited staffing during and the pandemic.*   Note:  This document was prepared using Dragon voice recognition software and may  include unintentional dictation errors.   Phillis Thackeray, Delice Bison, DO 05/06/21 [phone]

## 2021-05-06 NOTE — Discharge Instructions (Addendum)
You were seen in the emergency department after you had 1 positive blood culture from [facility].  Your other blood culture was normal.  This suggests that the abnormal blood culture was actually due to contamination from your skin.  Your labs, vital signs, urine were reassuring today and you have no signs of sepsis.  We have repeated blood cultures.  If these are abnormal, you will be contacted to return to the emergency department at that time for further treatment.  You do not need antibiotics or admission currently.  You develop fever of 100.4 or higher, chest pain or shortness of breath, vomiting that does not stop, weakness, feel like you are going to pass out or do pass out, please return to the emergency department.

## 2021-05-07 LAB — URINE CULTURE: Culture: NO GROWTH

## 2021-05-10 ENCOUNTER — Telehealth: Payer: Self-pay | Admitting: Oncology

## 2021-05-10 NOTE — Telephone Encounter (Signed)
Patient called to r/s missed appt on 6/1--he stated that he was sent to ED for "false sepsis". Patient is aware that Dr. Tasia Catchings is out of the office and is agreeable to come in on 6/15.  Routing to clinical team to make aware.

## 2021-05-11 LAB — CULTURE, BLOOD (ROUTINE X 2)
Culture: NO GROWTH
Culture: NO GROWTH
Special Requests: ADEQUATE
Special Requests: ADEQUATE

## 2021-05-16 ENCOUNTER — Telehealth: Payer: Self-pay | Admitting: *Deleted

## 2021-05-16 NOTE — Telephone Encounter (Signed)
Patient called reporting that he took his first dose of MS given by Dr Tasia Catchings Saturday and that he did not have a good experience with it. He states he was having panic attacks after taking it and that "I thought it was going to kill me"  He is requesting a change in his pain medicine. Please Sharyne Peach

## 2021-05-17 ENCOUNTER — Encounter: Payer: Self-pay | Admitting: Oncology

## 2021-05-17 MED ORDER — OXYCODONE HCL 5 MG/5ML PO SOLN: 5.0000 mg | ORAL | 0 refills | Status: DC | PRN

## 2021-05-17 NOTE — Telephone Encounter (Signed)
I spoke with patient by phone.  He says that the oxycodone was more effective and that he tolerated it well.  Discussed prescribing tablets but he says that he is having difficulty administering even when crushed.  He requested elixir if possible.  Will send Rx. #[id number]

## 2021-05-18 ENCOUNTER — Encounter: Payer: Self-pay | Admitting: Oncology

## 2021-05-18 ENCOUNTER — Other Ambulatory Visit: Payer: Self-pay

## 2021-05-18 ENCOUNTER — Inpatient Hospital Stay (HOSPITAL_BASED_OUTPATIENT_CLINIC_OR_DEPARTMENT_OTHER): Payer: Medicare Other | Admitting: Oncology

## 2021-05-18 ENCOUNTER — Inpatient Hospital Stay: Payer: Medicare Other | Attending: Oncology

## 2021-05-18 VITALS — BP 114/82 | HR 99 | Temp 98.1°F | Resp 18 | Wt 200.4 lb

## 2021-05-18 DIAGNOSIS — E871 Hypo-osmolality and hyponatremia: Secondary | ICD-10-CM | POA: Diagnosis not present

## 2021-05-18 DIAGNOSIS — Z931 Gastrostomy status: Secondary | ICD-10-CM | POA: Insufficient documentation

## 2021-05-18 DIAGNOSIS — D649 Anemia, unspecified: Secondary | ICD-10-CM | POA: Diagnosis not present

## 2021-05-18 DIAGNOSIS — R1310 Dysphagia, unspecified: Secondary | ICD-10-CM | POA: Insufficient documentation

## 2021-05-18 DIAGNOSIS — C109 Malignant neoplasm of oropharynx, unspecified: Secondary | ICD-10-CM | POA: Insufficient documentation

## 2021-05-18 DIAGNOSIS — C7800 Secondary malignant neoplasm of unspecified lung: Secondary | ICD-10-CM | POA: Diagnosis not present

## 2021-05-18 DIAGNOSIS — G893 Neoplasm related pain (acute) (chronic): Secondary | ICD-10-CM | POA: Insufficient documentation

## 2021-05-18 LAB — COMPREHENSIVE METABOLIC PANEL
ALT: 19 U/L (ref 0–44)
AST: 22 U/L (ref 15–41)
Albumin: 3.6 g/dL (ref 3.5–5.0)
Alkaline Phosphatase: 74 U/L (ref 38–126)
Anion gap: 7 (ref 5–15)
BUN: 26 mg/dL — ABNORMAL HIGH (ref 8–23)
CO2: 28 mmol/L (ref 22–32)
Calcium: 8.9 mg/dL (ref 8.9–10.3)
Chloride: 94 mmol/L — ABNORMAL LOW (ref 98–111)
Creatinine, Ser: 0.98 mg/dL (ref 0.61–1.24)
GFR, Estimated: >60 mL/min (ref >60)
Glucose, Bld: 132 mg/dL — ABNORMAL HIGH (ref 70–99)
Potassium: 4.2 mmol/L (ref 3.5–5.1)
Sodium: 129 mmol/L — ABNORMAL LOW (ref 135–145)
Total Bilirubin: 0.4 mg/dL (ref 0.3–1.2)
Total Protein: 6.5 g/dL (ref 6.5–8.1)

## 2021-05-18 LAB — CBC WITH DIFFERENTIAL/PLATELET
Abs Immature Granulocytes: 0.06 10*3/uL (ref 0.00–0.07)
Basophils Absolute: 0.1 10*3/uL (ref 0.0–0.1)
Basophils Relative: 1 %
Eosinophils Absolute: 0 10*3/uL (ref 0.0–0.5)
Eosinophils Relative: 0 %
HCT: 37.1 % — ABNORMAL LOW (ref 39.0–52.0)
Hemoglobin: 13 g/dL (ref 13.0–17.0)
Immature Granulocytes: 1 %
Lymphocytes Relative: 4 %
Lymphs Abs: 0.4 10*3/uL — ABNORMAL LOW (ref 0.7–4.0)
MCH: 32.3 pg (ref 26.0–34.0)
MCHC: 35 g/dL (ref 30.0–36.0)
MCV: 92.1 fL (ref 80.0–100.0)
Monocytes Absolute: 0.7 10*3/uL (ref 0.1–1.0)
Monocytes Relative: 6 %
Neutro Abs: 10.7 10*3/uL — ABNORMAL HIGH (ref 1.7–7.7)
Neutrophils Relative %: 88 %
Platelets: 442 10*3/uL — ABNORMAL HIGH (ref 150–400)
RBC: 4.03 MIL/uL — ABNORMAL LOW (ref 4.22–5.81)
RDW: 13.4 % (ref 11.5–15.5)
WBC: 12 10*3/uL — ABNORMAL HIGH (ref 4.0–10.5)
nRBC: 0 % (ref 0.0–0.2)

## 2021-05-18 MED ORDER — HEPARIN SOD (PORK) LOCK FLUSH 100 UNIT/ML IV SOLN
500.0000 [IU] | Freq: Once | INTRAVENOUS | Status: AC
  Administered 2021-05-18: 500 [IU] via INTRAVENOUS
  Filled 2021-05-18: qty 5

## 2021-05-18 MED ORDER — HEPARIN SOD (PORK) LOCK FLUSH 100 UNIT/ML IV SOLN
INTRAVENOUS | Status: AC
  Filled 2021-05-18: qty 5

## 2021-05-18 MED ORDER — SODIUM CHLORIDE 0.9% FLUSH
10.0000 mL | INTRAVENOUS | Status: DC | PRN
  Administered 2021-05-18: 10 mL via INTRAVENOUS
  Filled 2021-05-18: qty 10

## 2021-05-18 NOTE — Progress Notes (Signed)
Hematology/Oncology  Follow Up note [facility] [phone] [phone]   Patient Care Team: Albina Billet, MD as PCP - General (Internal Medicine) Albina Billet, MD (Internal Medicine) Bary Castilla, Forest Gleason, MD (General Surgery) Noreene Filbert, MD as Referring Physician (Radiation Oncology) Earlie Server, MD as Consulting Physician (Oncology)  CHIEF COMPLAINTS/PURPOSE OF CONSULTATION:  Follow up for head and neck cancer.  HISTORY OF PRESENTING ILLNESS:  Ivan Garcia. is a  65 y.o.  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 P16 positive. cT2 cN2 disease, stage II, # 12/13/2017: PET whole body: Primary hypermetabolic mucosal lesion in the left oropharynx/tongue base with right-sided bulky multilevel lymphadenopathy. #12/31/2017. Concurrent ChemoRT [Cisplatin 100 mg/m2 q3weeks]  S/p one dose of Cisplatin. Cisplatin discontinued due to nephrotoxicity/AKI . switch to weekly Carboplatin (AUC 2) / Taxol 7m/m2 [ finished May 2019] # 03/08/2018: PET image skull base to midthigh restaging: Near complete resolution of prior large right neck mass. Small right cervical/supraclavicular nodal metastases measuring up to 10 mm short axis. No evidence of distant metastases.  # 07/02/2018 PET No evidence of residual carcinoma in the neck. No evidence of nodal metastasis. No evidence distant metastatic disease  # July 2020, he started to have hoarseness after singing for 2 to 3 hours. He has changed his insurance and Dr. MTami Ribasis no longer covered by his current insurance.   08/04/2019 [facility] [facility] Flexible laryngoscopy showed Vocal cord paralysis.  Patient was recommended to have a PET scan done for restaging. Patient's primary care provider Dr. THall Busingordered a PET scan for  patient which was done on 08/20/2019. 08/20/2019 PET scan showed asymmetric hypermetabolic  involving the left vocal cords with hypermetabolic adenopathy in the neck, and the chest.  He also has hypermetabolic pulmonary nodule along the minor fissure which is felt to be metastatic.  # 09/08/2019   ultrasound-guided right supraclavicular lymph node biopsy Pathology was positive for metastatic squamous cell carcinoma. # 09/08/2019 Omniseq NGS showed PD-L1 CPS 20, TMB indeterminant, MSI stable,PIK3CA [id number] # 09/29/2019- 01/2020  6 cycles of carboplatin, 5-FU and Keytruda, finished in February 2021.  #12/18/2019, PET/CT: marked improvement in met disease, continued on Keytruda maintenance. # 04/26/2020 PET worsening of disease in the chest.  Interval enlargement of the right thoracic inlet and paratracheal lymph nodes that were previously enlarged with new activity in the right hilum corresponding to a lymph node in this location.  Also increased activity in the right upper lobe nodule. Right level 3 lymph node in the neck and anterior mediastinal nodal activity is diminished.  These areas are quite small on prior study. Diminished symmetric uptake within the left as compared to the right vocal cord. # [facility].  Patient was offered for clinical trial with pembrolizumab/lenvatinib versus standard therapy.  05/14/2021 Patient received additional 1 cycle of Keytruda while waiting to be enrolled to the trial.   #06/24/2020, [facility] right upper paratracheal node is concerning for metastatic disease and a new from 01/20/2020 PET/CT.  The node closely abuts and possibly invade the posterolateral right tracheal wall as well as the esophagus.  Right vocal cord paralysis.  Symmetric soft tissue density at the right base of  tongue could reflect residual/recurrent primary malignancy versus posttreatment changes.   Right supraclavicular lymph node is no longer appreciated with post treatment changes.  There is increased size of right minor fissure nodule which was metabolically avid on previous PET scan.  New mediastinal and hilar lymphadenopathy concerning for new nodal disease.  No evidence of metastatic disease below the diaphragm.  Nonobstructing 3 mm distal ureter stone.- Disease progression.  Not eligible for the clinical trial at [facility]  # 07/07/2020 09/02/2020 palliative chemotherapy with 3 cycles of docetaxel  with G-CSF support.   # 09/02/2020,chest without contrast with 3D MIPS protocol showed Interval increase in the size of the right upper lobe perifissural nodule 1.6 x 1.5 cm-previously 1.4 x 1.2 cm tracheoesophageal soft tissue mass 3.4 x 5.0 cm with regional mass-effect on the esophagus-previously 2.4 x 3.6 cm, and mediastinal lymphadenopathy-right upper paratracheal lymph node 1.9 cm-previously 1.5 cm, suspicious for progressive metastatic disease.Subcentimeter lung nodules are stable. CT neck with contrast showed slight interval increase in size of upper right paratracheal nodal conglomerate.  Questionable soft tissue invasion of the posterior tracheal appears worsened.  Compression with potential invasion of the esophagus is again noted.  Prior vocal cord paralysis again noted.  Unchanged right tongue base asymmetry. I had a discussion with patient's [facility] oncologist Dr. Barrington Ellison over the phone. Dr.Choe recommended adding carboplatin AUC 5 to docetaxel regimen for now which hopefully to help to reduce the size of the tracheoesophageal soft tissue mass.  Also recommend palliative radiation. # 09/07/2020, carboplatin AUC 5+ docetaxel # 10/21/2021S/p PEG tube placement   #09/28/2020- 10/25/20 patient finished palliative radiation to esophageal mass. #Patient establish care with [facility] Dr. Maxie Better   09/29/2020  guardant 360 which showed PIK3CA E545K, PIK3CA amplification. MSI stable, APC [id number] VUS , APC  L2138L, FGFR2 R573 VUS   . # 11/09/2020, patient had a PET scan which showed partial response Right thoracic inlet Hypermetabolic soft tissue metastasis, 3.9 x 2.6cm, decreased in size, decreased size of  Hypermetabolic right paratracheal 1.2cm, and right hilar nodal Metastases since outside 09/02/2020 neck CT Right upper lobe lung nodule 1.2cm, stable size.  RadOnc Dr. Baruch Gouty recommend additional radiation. #11/23/20-12/17/19 patient finished additional radiation # 12/22/2020,Piqray 300mg  daily  discontinued on 03/31/2021 04/01/2021 CT Neck/Chest abdomen pelvis  Progressive enlargement of the right larynx and supraglottic tissues with low-density most compatible with edema from radiation. Correlate with direct visualization mucosa to evaluate for tumor recurrence in this area Right paraesophageal lymph node mass is ill-defined and unchanged in size. Possible invasion of the esophagus.  INTERVAL HISTORY Elgar Scoggins Paymon Rosensteel. is a 65 y.o. male who presents for follow-up for metastatic squamous cell carcinoma, P 16+.   He went on a clinical trial at [facility] with cetuximab, cycle 1 day 1 on 05/04/2021.  Unfortunately patient developed grade 4 infusion related reaction, syncope while on toilet which progressed to cardiopulmonary arrest requiring CPR for 30 to 60 seconds until ROSC, epinephrine, sent to emergency room and he was admitted for 24 hours.  He has sinus tachycardia and had a TEE done which normal LVEF.  He was referred to establish care with cardiologist at [facility].  Patient was seen by Dr.Lycan on 05/13/2021, was recommended to allow cetuximab washout and return to his office on 05/27/2021 for reviewing of additional clinical trial eligibility.  Patient was also called back to emergency room due to positive Staphylococcus epidermis culture and repeat culture was negative. He reports that he used liquid morphine which helped his  pain but he did not tolerate.  Pain management was adjusted to  oral oxycodone liquid via PEG tube. Dysphagia appears to be worse compared to last visit Denies chest pain, shortness of breath, nausea vomiting diarrhea.  Review of Systems  Constitutional:  Negative for chills, fever, malaise/fatigue and weight loss.  HENT:  Negative for sore throat.        Dysphagia   Eyes:  Negative for redness.  Respiratory:  Negative for cough, shortness of breath and wheezing.   Cardiovascular:  Negative for chest pain, palpitations and leg swelling.  Gastrointestinal:  Negative for abdominal pain, blood in stool, nausea and vomiting.  Genitourinary:  Negative for dysuria.  Musculoskeletal:  Negative for myalgias.  Skin:  Negative for rash.  Neurological:  Negative for dizziness, tingling and tremors.  Endo/Heme/Allergies:  Does not bruise/bleed easily.  Psychiatric/Behavioral:  Negative for hallucinations.    MEDICAL HISTORY:  Past Medical History:  Diagnosis Date   Arthritis    Cancer ([place])    Head and neck cancer   Hemorrhoids    Hyperlipidemia    Hypertension     SURGICAL HISTORY: Past Surgical History:  Procedure Laterality Date   COLONOSCOPY WITH PROPOFOL N/A 04/04/2017   Procedure: COLONOSCOPY WITH PROPOFOL;  Surgeon: Robert Bellow, MD;  Location: [facility] ENDOSCOPY;  Service: Endoscopy;  Laterality: N/A;   IR GASTROSTOMY TUBE MOD SED  09/24/2020   MANDIBLE SURGERY     PORT-A-CATH REMOVAL  07/17/2018   PORTA CATH INSERTION N/A 12/19/2017   Procedure: PORTA CATH INSERTION;  Surgeon: Algernon Huxley, MD;  Location: [place] CV LAB;  Service: Cardiovascular;  Laterality: N/A;   PORTA CATH INSERTION N/A 07/17/2018   Procedure: PORTA CATH INSERTION;  Surgeon: Algernon Huxley, MD;  Location: [place] CV LAB;  Service: Cardiovascular;  Laterality: N/A;   PORTA CATH INSERTION N/A 09/18/2019   Procedure: PORTA CATH INSERTION;  Surgeon: Algernon Huxley, MD;  Location: [place] [facility];  Service: Cardiovascular;  Laterality: N/A;   TONSILLECTOMY       SOCIAL HISTORY: Social History   Socioeconomic History   Marital status: Single    Spouse name: Not on file   Number of children: Not on file   Years of education: Not on file   Highest education level: Not on file  Occupational History   Not on file  Tobacco Use   Smoking status: Never   Smokeless tobacco: Never  Vaping Use   Vaping Use: Never used  Substance and Sexual Activity   Alcohol use: No   Drug use: No   Sexual activity: Not on file  Other Topics Concern   Not on file  Social History Narrative   Not on file   Social Determinants of Health   Financial Resource Strain: Not on file  Food Insecurity: Not on file  Transportation Needs: Not on file  Physical Activity: Not on file  Stress: Not on file  Social Connections: Not on file  Intimate Partner Violence: Not on file    FAMILY HISTORY: Family History  Problem Relation Age of Onset   Breast cancer Mother    Asthma Mother    Congestive Heart Failure Mother    Prostate cancer Father    Brain cancer Father    Bladder Cancer Father     ALLERGIES:  has No Known Allergies.  MEDICATIONS:  Current Outpatient Medications  Medication Sig Dispense Refill   amLODipine (NORVASC) 5 MG tablet Take 5 mg by mouth daily.  atorvastatin (LIPITOR) 20 MG tablet SMARTSIG:1 Tablet(s) By Mouth Every Evening     gabapentin (NEURONTIN) 100 MG capsule Take 1 capsule (100 mg total) by mouth at bedtime. 30 capsule 0   lidocaine-prilocaine (EMLA) cream Apply to affected area once 30 g 3   metoprolol succinate (TOPROL-XL) 25 MG 24 hr tablet Take 1 tablet by mouth daily.     omeprazole (PRILOSEC) 20 MG capsule Take 1 capsule (20 mg total) by mouth daily. 90 capsule 1   oxyCODONE (ROXICODONE) 5 MG/5ML solution Place 5-10 mLs (5-10 mg total) into feeding tube every 4 (four) hours as needed for severe pain. 240 mL 0   alpelisib (PIQRAY, 300 MG DAILY DOSE,) 2 x 150 MG Therapy Pack Take 300 mg once a day. (Patient not taking:  Reported on 05/18/2021) 56 each 3   nystatin (MYCOSTATIN) 100000 UNIT/ML suspension Take 5 mLs (500,000 Units total) by mouth in the morning, at noon, and at bedtime. SWISH AND SPIT (Patient not taking: No sig reported) 473 mL 0   ondansetron (ZOFRAN) 8 MG tablet Take 1 tablet (8 mg total) by mouth 2 (two) times daily as needed for refractory nausea / vomiting. Start on day 3 after carboplatin chemo. (Patient not taking: No sig reported) 30 tablet 1   prochlorperazine (COMPAZINE) 10 MG tablet Take 1 tablet (10 mg total) by mouth every 6 (six) hours as needed (Nausea or vomiting). (Patient not taking: No sig reported) 30 tablet 1   No current facility-administered medications for this visit.   Facility-Administered Medications Ordered in Other Visits  Medication Dose Route Frequency Provider Last Rate Last Admin   sodium chloride flush (NS) 0.9 % injection 10 mL  10 mL Intravenous PRN Earlie Server, MD       sodium chloride flush (NS) 0.9 % injection 10 mL  10 mL Intravenous PRN Earlie Server, MD   10 mL at 10/13/20 1034     PHYSICAL EXAMINATION: ECOG PERFORMANCE STATUS: 1 - Symptomatic but completely ambulatory Vitals:   05/18/21 1346  BP: 114/82  Pulse: 99  Resp: 18  Temp: 98.1 F (36.7 C)  SpO2: 95%   Physical Exam Constitutional:      General: He is not in acute distress.    Appearance: He is not diaphoretic.  HENT:     Head: Normocephalic and atraumatic.     Nose: Nose normal.     Mouth/Throat:     Pharynx: No oropharyngeal exudate.  Eyes:     General: No scleral icterus.    Pupils: Pupils are equal, round, and reactive to light.  Neck:     Comments: Right cervical lymphadenopathy Cardiovascular:     Rate and Rhythm: Normal rate and regular rhythm.     Heart sounds: No murmur heard. Pulmonary:     Effort: Pulmonary effort is normal. No respiratory distress.     Breath sounds: No rales.  Chest:     Chest wall: No tenderness.  Abdominal:     General: There is no distension.      Palpations: Abdomen is soft.     Tenderness: There is no abdominal tenderness.     Comments: +PEG tube  Musculoskeletal:        General: Normal range of motion.     Cervical back: Normal range of motion and neck supple.  Lymphadenopathy:     Cervical: Cervical adenopathy present.  Skin:    General: Skin is warm and dry.     Findings: No erythema.  Neurological:  Mental Status: He is alert and oriented to person, place, and time.     Cranial Nerves: No cranial nerve deficit.     Motor: No abnormal muscle tone.     Coordination: Coordination normal.  Psychiatric:        Mood and Affect: Affect normal.     LABORATORY DATA:  I have reviewed the data as listed Lab Results  Component Value Date   WBC 12.0 (H) 05/18/2021   HGB 13.0 05/18/2021   HCT 37.1 (L) 05/18/2021   MCV 92.1 05/18/2021   PLT 442 (H) 05/18/2021   Recent Labs    08/17/20 0804 08/23/20 1414 08/31/20 1244 09/07/20 0833 04/01/21 0901 05/06/21 0312 05/18/21 1320  NA 139 137 137   < > 137 138 129*  K 4.0 3.6 4.5   < > 4.0 4.4 4.2  CL 105 100 98   < > 99 103 94*  CO2 $Re'25 26 27   'ejG$ < > $R'29 27 28  'gP$ GLUCOSE 101* 129* 134*   < > 102* 88 132*  BUN 23 21 25*   < > 25* 24* 26*  CREATININE 1.52* 1.50* 1.64*   < > 1.18 1.34* 0.98  CALCIUM 9.2 8.9 9.3   < > 9.4 9.0 8.9  GFRNONAA 47* 48* 43*   < > >60 59* >60  GFRAA 55* 56* 50*  --   --   --   --   PROT 7.4 7.0 7.3   < > 7.3 6.9 6.5  ALBUMIN 3.5 3.5 3.5   < > 4.3 3.8 3.6  AST $Re'19 18 20   'DXx$ < > $R'22 26 22  'MW$ ALT $'16 16 17   'g$ < > $R'19 28 19  'Dh$ ALKPHOS 82 93 103   < > 61 51 74  BILITOT 0.7 0.8 0.6   < > 1.1 0.6 0.4   < > = values in this interval not displayed.   RADIOGRAPHIC STUDIES: I have personally reviewed the radiological images as listed and agreed with the findings in the report. CT SOFT TISSUE NECK W CONTRAST  Result Date: 04/01/2021 CLINICAL DATA:  Squamous cell carcinoma oropharynx. Multiple recurrences. Original diagnosis 4 years ago. EXAM: CT NECK WITH CONTRAST  TECHNIQUE: Multidetector CT imaging of the neck was performed using the standard protocol following the bolus administration of intravenous contrast. CONTRAST:  45mL OMNIPAQUE IOHEXOL 300 MG/ML  SOLN COMPARISON:  CT neck 01/05/2021.  PET CT 11/09/2020 FINDINGS: Pharynx and larynx: Progressive soft tissue swelling and low-density edema in the larynx and supraglottic tissues, right greater than left. Favor radiation change with progression. Direct mucosal visualization recommended. Oropharynx negative for mass. Salivary glands: No mass or edema. Atrophic changes in the submandibular gland bilaterally. Thyroid: Negative Lymph nodes: Lymph node mass at the thoracic inlet to the right of the esophagus again noted and similar. This appears to be related to tumor and was hypermetabolic on the prior PET-CT. Possible invasion of the esophagus. Margins are ill-defined and difficult to measure particularly along the esophageal border. No gross change from the prior CT. Vascular: Normal vascular enhancement. Port-A-Cath right jugular vein Limited intracranial: Negative Visualized orbits: Negative Mastoids and visualized paranasal sinuses: Mucosal edema left maxillary sinus. Remaining sinuses clear. Skeleton: Cervical spondylosis without acute abnormality. Upper chest: Chest CT today reported separately Other: Edema in the anterior subcutaneous tissues of the neck related to radiation change. IMPRESSION: 1. Progressive enlargement of the right larynx and supraglottic tissues with low-density most compatible with edema from radiation. Correlate with direct  visualization mucosa to evaluate for tumor recurrence in this area 2. Right paraesophageal lymph node mass is ill-defined and unchanged in size. Possible invasion of the esophagus. Electronically Signed   By: Franchot Gallo M.D.   On: 04/01/2021 13:49   CT CHEST W CONTRAST  Result Date: 04/01/2021 CLINICAL DATA:  Squamous cell carcinoma of the oropharynx and chest.  Diagnosed 4 years ago. EXAM: CT CHEST WITH CONTRAST TECHNIQUE: Multidetector CT imaging of the chest was performed during intravenous contrast administration. CONTRAST:  37mL OMNIPAQUE IOHEXOL 300 MG/ML  SOLN COMPARISON:  01/05/2021.  Neck CT from today's dictated separately. FINDINGS: Cardiovascular: Right-sided Port-A-Cath terminates at the superior caval/atrial junction. Aortic atherosclerosis. Normal heart size, without pericardial effusion. Three vessel coronary artery calcification. No central pulmonary embolism, on this non-dedicated study. Mediastinum/Nodes: Soft tissue mass at the right side of the thoracic inlet was detailed on neck CT dictated separately. Right paratracheal nodal tissue is similar at 8 mm on 16/3. Soft tissue stranding surrounding the right hilum including on 28/3, without residual well-defined adenopathy. Lungs/Pleura: Trace right pleural fluid is new since the prior CT. Presumed secretions within the dependent trachea. Inferior right upper lobe pulmonary nodule measures 1.2 by 1.3 cm on 34/4 versus 1.1 x 1.0 cm on the prior exam. A central right lower lobe pulmonary nodule measures 1.1 x 1.2 cm on 38/4 versus 5 mm on the prior exam (when remeasured). 8 mm posterior right upper lobe nodule on 21/4 is enlarged from 4 mm on image 56/6 of the prior exam. The previously described ground-glass opacities have resolved. Upper Abdomen: Segment 2-3 hepatic cyst and too small to characterize lesion are unchanged. Normal imaged portions of the spleen, pancreas, gallbladder, adrenal glands. Mild renal cortical thinning bilaterally. Upper pole right renal 4.9 cm fluid density lesion is likely a cyst. Gastrostomy tube is appropriately positioned. Musculoskeletal: No acute osseous abnormality. IMPRESSION: 1. Enlargement of right-sided pulmonary nodules, most consistent with metastasis. 2. No thoracic adenopathy. Please see neck CT for discussion of mass at the thoracic inlet. 3. Aortic atherosclerosis  (ICD10-I70.0), coronary artery atherosclerosis and emphysema (ICD10-J43.9). 4. New soft tissue stranding surrounding the right hilum with resolution of adenopathy. Possibly related to interval response to radiation therapy. 5. New trace right pleural fluid. 6. Aortic atherosclerosis (ICD10-I70.0), coronary artery atherosclerosis and emphysema (ICD10-J43.9). Electronically Signed   By: Abigail Miyamoto M.D.   On: 04/01/2021 14:26   MR Brain W Wo Contrast  Result Date: 04/10/2021 CLINICAL DATA:  History of head neck cancer with headache. EXAM: MRI HEAD WITHOUT AND WITH CONTRAST TECHNIQUE: Multiplanar, multiecho pulse sequences of the brain and surrounding structures were obtained without and with intravenous contrast. CONTRAST:  58mL GADAVIST GADOBUTROL 1 MMOL/ML IV SOLN COMPARISON:  None similar FINDINGS: Brain: No acute for subacute infarction, hemorrhage, hydrocephalus, extra-axial collection or mass lesion. No swelling or enhancement to suggest metastatic disease. Vascular: Normal flow voids and vascular enhancements Skull and upper cervical spine: Normal marrow signal. Accentuated fatty marrow likely from radiation therapy Sinuses/Orbits: Mild mucosal thickening in the left maxillary sinus. Partial right mastoid opacification. These changes are chronic and may be post treatment. IMPRESSION: No evidence of metastatic disease. Electronically Signed   By: Monte Fantasia M.D.   On: 04/10/2021 20:51   MR Cervical Spine W Wo Contrast  Result Date: 04/25/2021 CLINICAL DATA:  History of metastatic oropharyngeal squamous cell carcinoma. EXAM: MRI CERVICAL AND THORACIC SPINE WITHOUT AND WITH CONTRAST TECHNIQUE: Multiplanar and multiecho pulse sequences of the cervical spine, to include the  craniocervical junction and cervicothoracic junction, and the thoracic spine, were obtained without and with intravenous contrast. CONTRAST:  9 mL GADAVIST IV SOLN COMPARISON:  Neck CT scan 04/01/2021. FINDINGS: MRI CERVICAL SPINE  FINDINGS Alignment: Normal. Vertebrae: No fracture. Edema and enhancement are seen in the anterior aspects of the C5 and C6 vertebral bodies, more prominent to the right. More extensive edema and enhancement are seen in the anterior aspect of C7 eccentric to the right. Cord: Cord signal is normal. No pathologic enhancement after contrast administration. No epidural tumor is seen. Posterior Fossa, vertebral arteries, paraspinal tissues: Soft tissue thickening and enhancement anterior to the cervical spine is noted but better seen on the prior neck CT. Disc levels: C2-3: Negative. C3-4: Mild disc bulge and uncovertebral disease. Mild to moderate facet degenerative disease is worse on the left. C4-5: Mild-to-moderate facet arthropathy and a shallow disc bulge. No stenosis. C5-6: There is a disc osteophyte complex and uncovertebral spurring. Mild central canal stenosis is present. Mild to moderate foraminal narrowing bilaterally. C6-7: Shallow disc bulge and uncovertebral disease. The central canal and left foramen are open. Mild to moderate right foraminal narrowing. C7-T1: Negative. MRI THORACIC SPINE FINDINGS Alignment:  Normal. Vertebrae: There is a small area of marrow edema and enhancement in the anterior aspect of T1 centrally and to the right. Marrow signal is otherwise normal. Cord: Normal signal throughout. No pathologic enhancement after contrast administration. No epidural tumor. Paraspinal and other soft tissues: Please see report of dedicated chest CT 04/01/2021. Small right pleural effusion noted. Disc levels: The central canal and foramina are widely patent at all levels. No disc protrusion is identified. IMPRESSION: Abnormal edema and enhancement in the anterior aspect of C5, C6, C7 and T1 are most conspicuous at C7 and most consistent with metastatic disease. Soft tissue thickening and enhancement anterior to the mid and lower cervical spine is noted as reported on prior neck CT. Small right pleural  effusion. Mild cervical spondylosis as described above. Electronically Signed   By: Inge Rise M.D.   On: 04/25/2021 11:51   MR Thoracic Spine W Wo Contrast  Result Date: 04/25/2021 : Please see report dictated under accession [id number] [facility] Electronically Signed   By: Inge Rise M.D.   On: 04/25/2021 12:00      ASSESSMENT & PLAN:   1. 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[place])   2. Dysphagia, unspecified type   3. Normocytic anemia   4. Hyponatremia    #Metastatic squamous cell carcinoma of oropharynx- cervical lymphadenopathy, lung metastatic disease, local invasion of esophagus. Anaphylactic reaction to cetuximab at [facility]. Patient has appointment with Dr.Lycan on 05/27/2021 for review of clinical trial ability.  # Dysphagia + PEG, follow-up with nutritionist. # Neoplasm associated pain, pain regimen was adjusted to oxycodone 5 to 10 mg through feeding tube every 4 hours as needed. #Hyponatremia, possible due to decreased oral intake.  Patient will receive 1 L of normal saline x1 for hydration. #Port-A-Cath in place, medi port flush Q8 weeks if not plan to use here. TBD  Follow-up: to be determined.   Earlie Server, MD, PhD Hematology Oncology [place] at [facility] 05/18/2021

## 2021-05-18 NOTE — Progress Notes (Signed)
Patient here for follow up. 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

## 2021-05-19 ENCOUNTER — Ambulatory Visit: Payer: Medicare Other

## 2021-05-19 ENCOUNTER — Inpatient Hospital Stay: Payer: Medicare Other

## 2021-05-19 DIAGNOSIS — C109 Malignant neoplasm of oropharynx, unspecified: Secondary | ICD-10-CM | POA: Diagnosis not present

## 2021-05-19 MED ORDER — HEPARIN SOD (PORK) LOCK FLUSH 100 UNIT/ML IV SOLN
500.0000 [IU] | Freq: Once | INTRAVENOUS | Status: AC
  Administered 2021-05-19: 500 [IU] via INTRAVENOUS
  Filled 2021-05-19: qty 5

## 2021-05-19 MED ORDER — HEPARIN SOD (PORK) LOCK FLUSH 100 UNIT/ML IV SOLN
INTRAVENOUS | Status: AC
  Filled 2021-05-19: qty 5

## 2021-05-19 MED ORDER — SODIUM CHLORIDE 0.9 % IV SOLN
Freq: Once | INTRAVENOUS | Status: AC
  Filled 2021-05-19: qty 250

## 2021-05-19 NOTE — Progress Notes (Signed)
See phone note

## 2021-05-19 NOTE — Progress Notes (Signed)
Nutrition Follow-up:  Phone called received from patient asking if he needed to cut back on tube feeding due to weight gain. Also concerned about low sodium value.  Chart reviewed.  Patient received 1 liter of normal saline today.  He is currently not on any chemo medication.  Joylene Draft has been stopped. Was on cetuximab but went into cardiac arrest following treatment and this has been stopped.  Reports that over the past 2-3 weeks he has not been able to take anything orally (no fluid) and relying on feeding tube. Previously was eating orally and doing 3-4 tube feeding pending what he was able to eat.  Patient unable to swallow. Patient has been giving 180 ml of water with each feeding, water with medications as has to be crushed now but unsure amount.   Concerned about weight gain. Denies swelling. Fatigued and no energy.     Medications: reviewed  Labs: Na 129, glucose 132, BUN 26, creatinine 0.98  Anthropometrics:   Weight today 200 lb today  198 lb 4.8 oz on 5/3 Re-weighed with nothing in pockets or shoes and 195 lb 4 oz  188 lb on 3/31 185 lb on 3/3 182 lb on 1/18   Estimated Energy Needs  Kcals: 2100-2500 Protein: 105 -125 g Fluid: ~ 2 L  NUTRITION DIAGNOSIS: Inadequate oral intake continues   INTERVENTION:  Patient concerned about continued weight gain. Recommend 4 cartons of [employer] 1.4 daily as patient now not taking anything orally as before and monitor weight.  Will provide 1820 calories, 80 g protein and 95ml free water.  Continue 168ml water flush 4 times per day with feeding.  Additional 329ml fluid with medication for total free water of 2065ml free water.  If sodium level continues to stay low or drop can consider using pedialyte instead of water as flush for additional sodium.   Patient has contact information    MONITORING, EVALUATION, GOAL: weight trends, tube feeding, labs   NEXT VISIT: Wednesday, June 22nd after MD visit  Charline Hoskinson [name], [place],  [place] Registered Dietitian [phone] (mobile)

## 2021-05-20 ENCOUNTER — Telehealth: Payer: Self-pay

## 2021-05-20 NOTE — Telephone Encounter (Signed)
Nutrition  Spoke with Dr Tasia Catchings and wanted patient to begin pedialyte (contains sodium) instead of water flush due to low sodium.   RD called and discussed with patient.  Patient agreeable.    Ivan Pretty [name], [place], [place] Registered Dietitian [phone] (mobile)

## 2021-05-25 ENCOUNTER — Inpatient Hospital Stay: Payer: Medicare Other

## 2021-05-25 ENCOUNTER — Other Ambulatory Visit: Payer: Self-pay

## 2021-05-25 ENCOUNTER — Ambulatory Visit
Admission: RE | Admit: 2021-05-25 | Discharge: 2021-05-25 | Disposition: A | Discharge status: Home / Self Care | Payer: Medicare Other | Source: Ambulatory Visit | Attending: Radiation Oncology | Admitting: Radiation Oncology

## 2021-05-25 ENCOUNTER — Encounter: Payer: Self-pay | Admitting: Radiation Oncology

## 2021-05-25 VITALS — BP 119/85 | HR 103 | Temp 96.3°F | Resp 16 | Wt 200.7 lb

## 2021-05-25 DIAGNOSIS — C7951 Secondary malignant neoplasm of bone: Secondary | ICD-10-CM | POA: Insufficient documentation

## 2021-05-25 DIAGNOSIS — C01 Malignant neoplasm of base of tongue: Secondary | ICD-10-CM | POA: Diagnosis not present

## 2021-05-25 DIAGNOSIS — C109 Malignant neoplasm of oropharynx, unspecified: Secondary | ICD-10-CM

## 2021-05-25 DIAGNOSIS — Z923 Personal history of irradiation: Secondary | ICD-10-CM | POA: Insufficient documentation

## 2021-05-25 DIAGNOSIS — C778 Secondary and unspecified malignant neoplasm of lymph nodes of multiple regions: Secondary | ICD-10-CM | POA: Insufficient documentation

## 2021-05-25 NOTE — Progress Notes (Signed)
Radiation Oncology Follow up Note  Name: Ivan Garcia [facility].   Date:   05/25/2021 MRN:  [id number] DOB: 12-19-1955    This 65 y.o. male presents to the clinic today for 19-month follow-up status post split course radiation therapy to his chest and supraclavicular region for locally advanced.  Squamous cell carcinoma  REFERRING PROVIDER: Albina Billet, MD  HPI: Patient is a 65 year old male now out 5 months having completed radiation therapy to both his chest and supraclavicular region and a split course fashion for locally advanced squamous cell carcinoma..  He has been referred to [facility] although had a anaphylactic reaction recently to cetuximab.  He does have follow-up appointments for possible clinical trial at [facility] in the next several weeks.  He currently is on narcotic analgesics for pain.  He is not yet had a follow-up PET CT scan.  He currently has a PEG tube is having appointments with nutritionist.  MRI of his cervical spine does show metastatic involvement.  MRI of brain showed no evidence to suggest intracranial metastasis.  COMPLICATIONS OF TREATMENT: none  FOLLOW UP COMPLIANCE: keeps appointments   PHYSICAL EXAM:  BP 119/85 (BP Location: Left Arm, Patient Position: Sitting)   Pulse (!) 103   Temp (!) 96.3 F (35.7 C) (Tympanic)   Resp 16   Wt 200 lb 11.2 oz (91 kg)   BMI 29.64 kg/m  Well-developed well-nourished patient in NAD. HEENT reveals PERLA, EOMI, discs not visualized.  Oral cavity is clear. No oral mucosal lesions are identified. Neck is clear without evidence of cervical or supraclavicular adenopathy. Lungs are clear to A&P. Cardiac examination is essentially unremarkable with regular rate and rhythm without murmur rub or thrill. Abdomen is benign with no organomegaly or masses noted. Motor sensory and DTR levels are equal and symmetric in the upper and lower extremities. Cranial nerves II through XII are grossly intact. Proprioception is intact. No  peripheral adenopathy or edema is identified. No motor or sensory levels are noted. Crude visual fields are within normal range.  RADIOLOGY RESULTS: MRI of thoracic cervical spine as well as brain all reviewed compatible with above-stated findings  PLAN: At this time he will continue evaluation at [facility] for possible clinical trial.  I have asked to see him back in 2 months for follow-up and will have a Pete PET CT scan performed prior to that visit.  Should to be anything for palliation including his cervical spine based on his PET CTs findings may offer palliative treatment at that time.  Patient comprehends my recommendations well.  I would like to take this opportunity to thank you for allowing me to participate in the care of your patient.Noreene Filbert, MD

## 2021-05-25 NOTE — Progress Notes (Signed)
Nutrition Follow-up:  Patient with metastatic squamous cell carcinoma, p 16 +.  S/p PEG placement on 09/24/20.  Patient has stopped piqray, was unable to tolerate cetuximab as went into cardiac arrest.  Planning appointment on 6/24 with [facility] for further options.    Met with patient following radiation f/u appointment.  Patient reports that he does not feel good.  Reports increase in pain and neuropathy, unable to sleep at night.  Requesting RD touch base with Dr Collie Siad team.    Taking 4 cartons of [facility] 1.4 shake via feeding tube (1 carton, 4 times per day). Flushing with pedialyte with feeding (17m) and using water with medication flush.  Unable to take solid foods or liquids orally.  Patient with fatigue unable to walk,exercise as before prior to starting piqray.  Anthropometrics:   Weight 200 lb 11.2 oz today  200 lb 6/16 198 lb 4.8 oz on 5/3 Re-weighed with nothing in pockets or shoes 195 lb 4 oz  188 lb on 3/31 185 lb on 3/3 182 lb on 1/18   NUTRITION DIAGNOSIS: Inadequate oral intake continues but relying on feeding tube   INTERVENTION:  Continue 4 cartons of [employer] daily due to weight gain.  Flush with 1883mof pedialyte (low sodium). Needs additional 36072mf fluid to better meet hydration needs. Patient has contact information    MONITORING, EVALUATION, GOAL: weight trends, intake   NEXT VISIT: phone call in ~3 weeks  Dannel Rafter B. AllZenia ResidesD,[place]istered Dietitian [phone])

## 2021-05-27 ENCOUNTER — Telehealth: Payer: Self-pay | Admitting: *Deleted

## 2021-05-27 ENCOUNTER — Other Ambulatory Visit: Payer: Self-pay | Admitting: Oncology

## 2021-05-27 ENCOUNTER — Telehealth: Payer: Self-pay

## 2021-05-27 ENCOUNTER — Telehealth: Payer: Self-pay | Admitting: Oncology

## 2021-05-27 DIAGNOSIS — T451X5A Adverse effect of antineoplastic and immunosuppressive drugs, initial encounter: Secondary | ICD-10-CM | POA: Insufficient documentation

## 2021-05-27 DIAGNOSIS — G620 Drug-induced polyneuropathy: Secondary | ICD-10-CM | POA: Insufficient documentation

## 2021-05-27 DIAGNOSIS — I469 Cardiac arrest, cause unspecified: Secondary | ICD-10-CM | POA: Insufficient documentation

## 2021-05-27 MED ORDER — GABAPENTIN 100 MG PO CAPS: 200.0000 mg | ORAL_CAPSULE | Freq: Three times a day (TID) | ORAL | 0 refills | Status: DC

## 2021-05-27 MED ORDER — SENNOSIDES-DOCUSATE SODIUM 8.6-50 MG PO TABS: 2.0000 | ORAL_TABLET | Freq: Every day | ORAL | 3 refills | Status: AC

## 2021-05-27 NOTE — Telephone Encounter (Signed)
Patient called stating that he spoke with Dr Tasia Catchings this morning and she was going to call in Gabapentin 200 mg caps for him, but when he went to get prescription, there was prescription for 100 mg caps to take three times a day and he said this will not suffice for only a 5 day prescription and 100 mg dose. He is asking that this be corrected

## 2021-05-27 NOTE — Telephone Encounter (Signed)
Patient called and reports that he has had worsening of neuropathy symptoms.  Oxycodone makes him constipated. Right hand numbness tingling worse.  Recommend patient to increase gabapentin to 200 mg 3 times daily He is on his way to see [facility] oncologist Dr. Maxie Better and ask him to tell Dr. Maxie Better about his worsening neuropathy.  I am concerned that he may have disease progression which may cause this radiculopathy.  Patient verbalized understanding and will further discuss with Dr. Maxie Better.

## 2021-05-27 NOTE — Telephone Encounter (Signed)
Nutrition  Patient called RD this am saying that he has not heard from Dr Tasia Catchings and her team about increased pain from neuropathy.  He is having a hard time giving tube feeding with right hand due to neuropathy.    Message sent to Dr Tasia Catchings and team to call patient today.    Iran Rowe [name], [place], [place] Registered Dietitian [phone] (mobile)

## 2021-05-27 NOTE — Telephone Encounter (Signed)
Patient informed that new prescription for greater qty was sent in by Dr Tasia Catchings and that he is to take 2 capsules of 100 mg three times a day to equal the 200 mg dosing tid

## 2021-05-30 ENCOUNTER — Other Ambulatory Visit: Payer: Self-pay

## 2021-05-30 ENCOUNTER — Telehealth: Payer: Self-pay | Admitting: *Deleted

## 2021-05-30 DIAGNOSIS — C109 Malignant neoplasm of oropharynx, unspecified: Secondary | ICD-10-CM

## 2021-05-30 NOTE — Telephone Encounter (Signed)
Pt informed of MD recommendation and will wait for call with appt.

## 2021-05-30 NOTE — Telephone Encounter (Signed)
Patient called reporting that he is having worsening pain with the neuropathy in his right arm and that the pain medicine is not working. He states he does not have any appts in [place] at this time and that he would like to have an xray done as discussed at his last OV with Dr Tasia Catchings. Please advise

## 2021-05-31 ENCOUNTER — Encounter: Payer: Self-pay | Admitting: Oncology

## 2021-05-31 ENCOUNTER — Ambulatory Visit
Admission: RE | Admit: 2021-05-31 | Discharge: 2021-05-31 | Disposition: A | Discharge status: Home / Self Care | Payer: Medicare Other | Source: Ambulatory Visit | Attending: Oncology | Admitting: Oncology

## 2021-05-31 ENCOUNTER — Other Ambulatory Visit: Payer: Self-pay

## 2021-05-31 DIAGNOSIS — R200 Anesthesia of skin: Secondary | ICD-10-CM | POA: Diagnosis present

## 2021-05-31 DIAGNOSIS — C109 Malignant neoplasm of oropharynx, unspecified: Secondary | ICD-10-CM

## 2021-05-31 IMAGING — MR MR CERVICAL SPINE WO/W CM
5 of 8 series · 28 of 48 positions shown · IV contrast (gadavist)
Comparison: MRI cervical spine [DATE]. CT neck soft tissues
[DATE]

CLINICAL DATA: Oropharyngeal cancer with recurrence. C7 para
esophageal tumor. Follow-up response to treatment.

EXAM:
MRI CERVICAL SPINE WITHOUT AND WITH CONTRAST
TECHNIQUE: Multiplanar and multiecho pulse sequences of the cervical spine, to
include the craniocervical junction and cervicothoracic junction,
were obtained without and with intravenous contrast.
CONTRAST:  9mL GADAVIST GADOBUTROL 1 MMOL/ML IV SOLN

[Series 5: T2 · sagittal · 3.0mm · 0.62mm/px · 3 of 15 slices shown (1 of 2)]
[im 1/15]
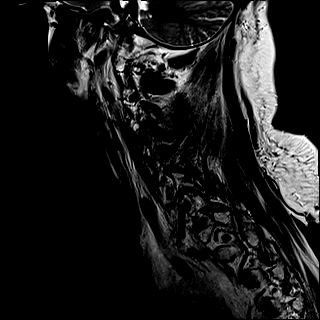
[im 8/15]
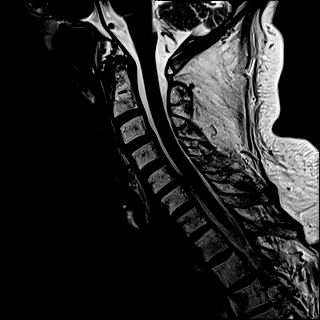
[im 15/15]
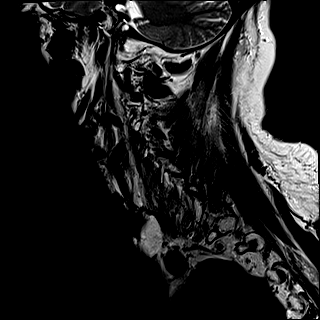

[Series 7: STIR · sagittal · 3.0mm · 0.62mm/px · 3 of 15 slices shown]
[im 1/15]
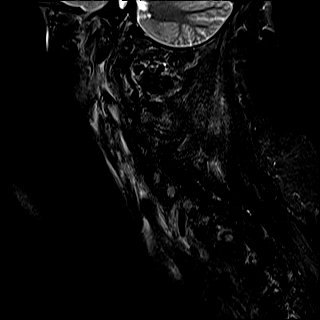
[im 8/15]
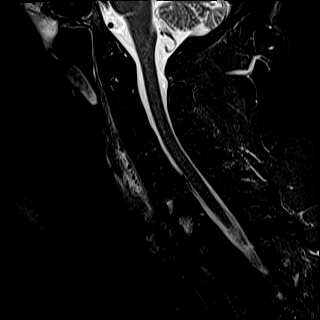
[im 15/15]
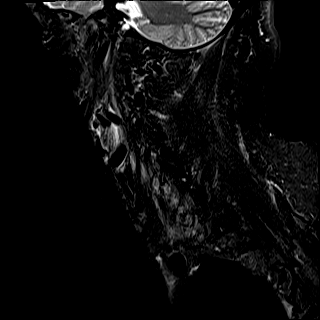

[Series 8: T2 · axial · 3.0mm · 0.70mm/px · z∈[-139,-10]mm · 9 of 41 slices shown (2 of 2)]
[im 1/41]
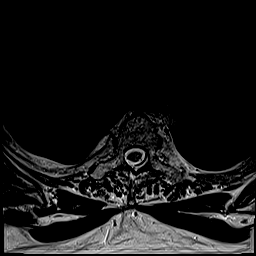
[im 6/41]
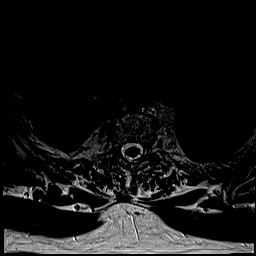
[im 11/41]
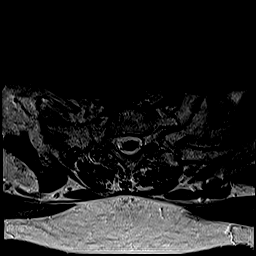
[im 16/41]
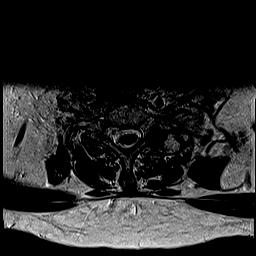
[im 21/41]
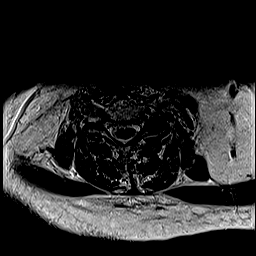
[im 26/41]
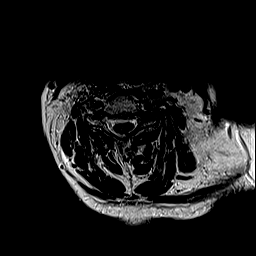
[im 31/41]
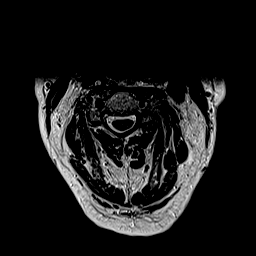
[im 36/41]
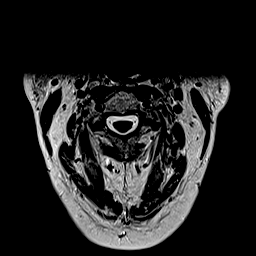
[im 41/41]
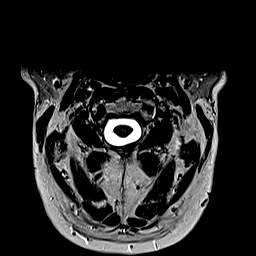

[Series 9: T1 · axial · non-contrast · 3.0mm · 0.35mm/px · z∈[-139,-10]mm · 9 of 41 slices shown]
[im 1/41]
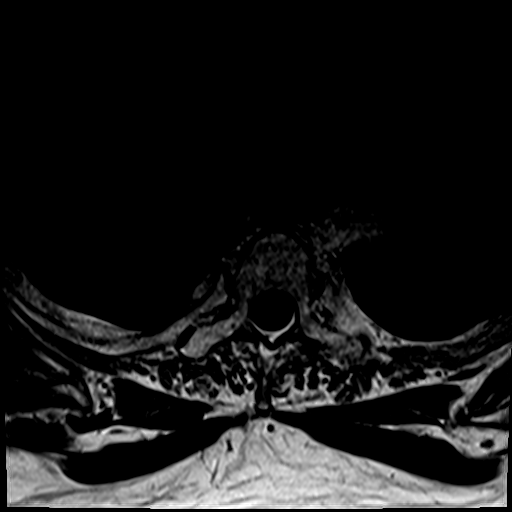
[im 6/41]
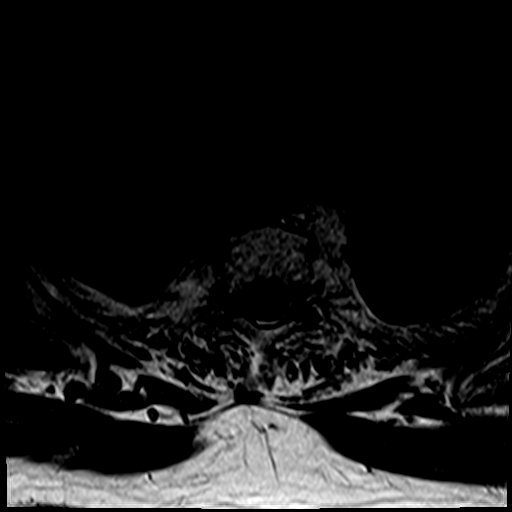
[im 11/41]
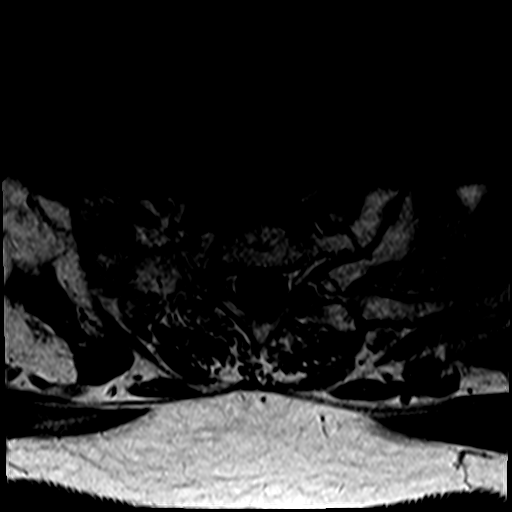
[im 16/41]
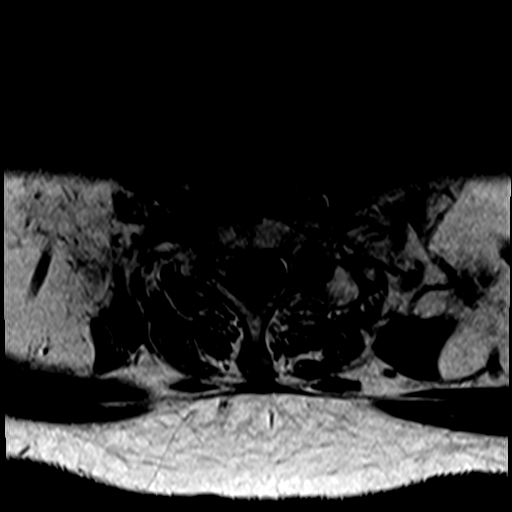
[im 21/41]
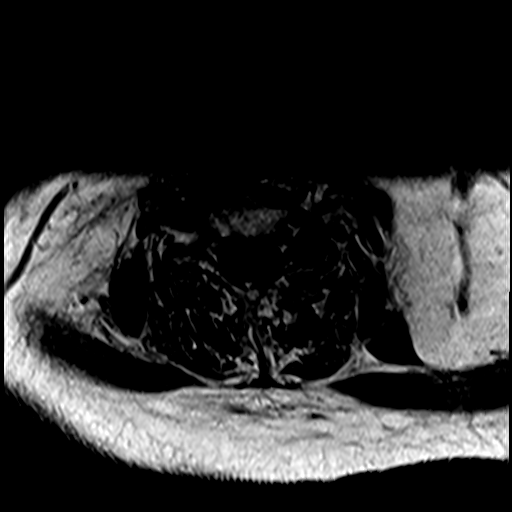
[im 26/41]
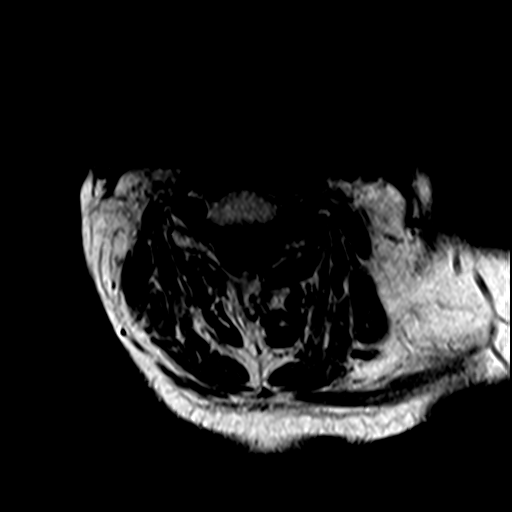
[im 31/41]
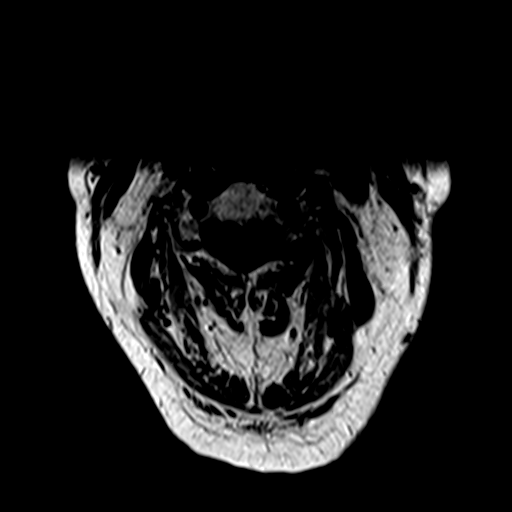
[im 36/41]
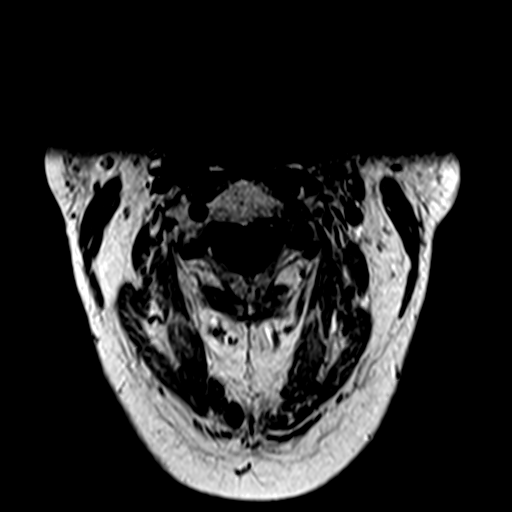
[im 41/41]
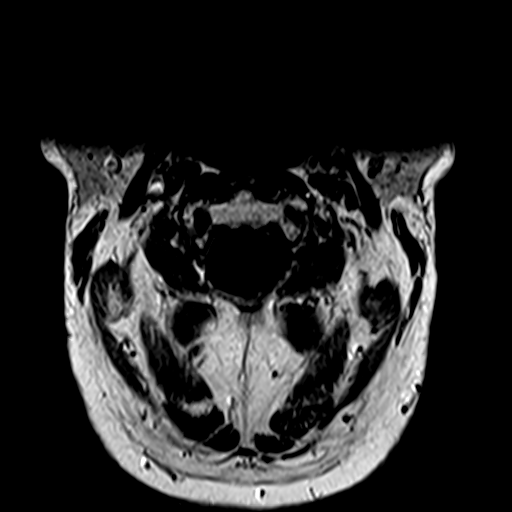

[Series 12: T1 post-contrast · axial · 3.0mm · 0.35mm/px · z∈[-139,-90]mm · 4 of 41 slices shown]
[im 1/41]
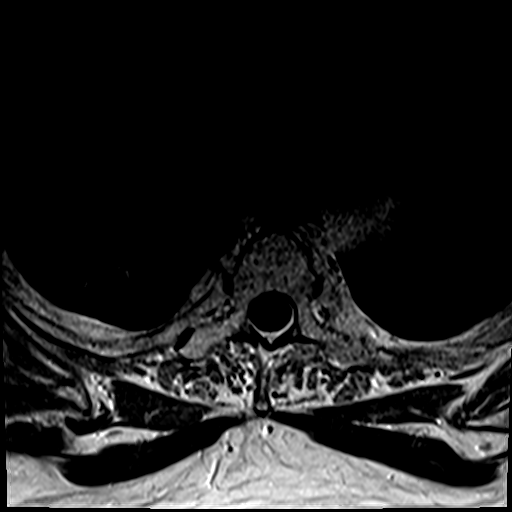
[im 6/41]
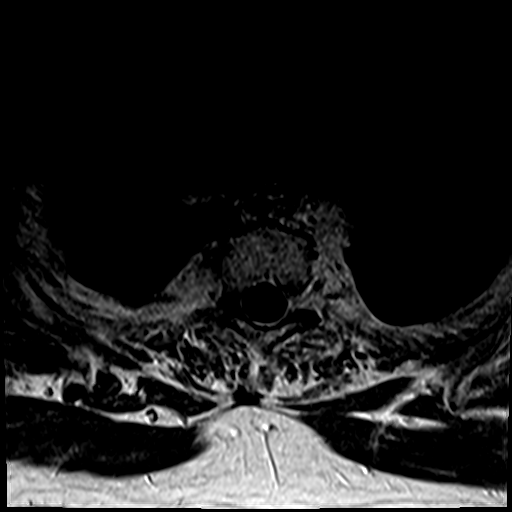
[im 11/41]
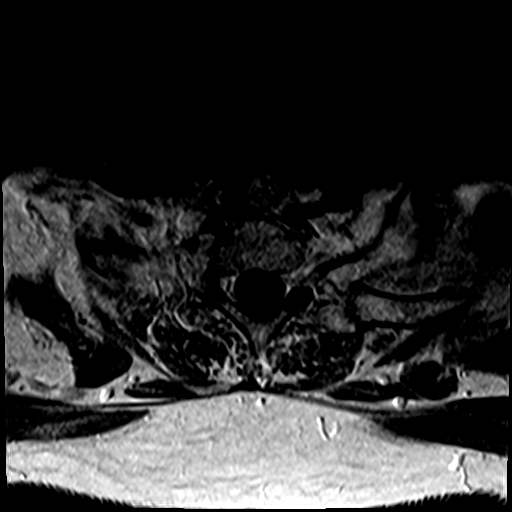
[im 16/41]
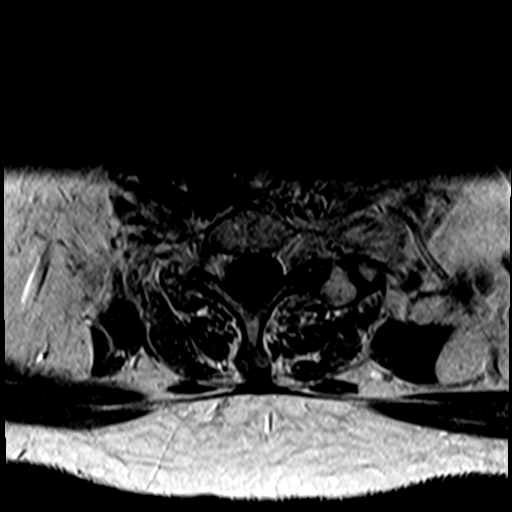

[28 of 48 positions shown; findings below may reference images not displayed]

FINDINGS: Alignment: Normal

Vertebrae: Bone marrow shows diffuse fatty replacement due to
radiation in the neck.

There are small areas of bone marrow edema and enhancement
anteriorly at C5, C6, C7, and T1 unchanged from the recent MRI. This
may be due to direct tumor invasion from the right para esophageal
soft tissue mass previously noted at the thoracic inlet. No
vertebral fracture. No new vertebral lesion.

Cord: Normal signal and morphology

Posterior Fossa, vertebral arteries, paraspinal tissues: Soft tissue
mass to the right of the esophagus at the thoracic inlet again
noted. This is difficult to compare with the prior CT but grossly
appear similar.

Disc levels:

C2-3: Negative

C3-4: Disc degeneration with uncinate spurring. Left-sided facet
degeneration. Moderate left foraminal stenosis

C4-5: Mild degenerative change.  Negative for stenosis

C5-6: Diffuse uncinate spurring bilaterally causing moderate
foraminal encroachment bilaterally. Mild spinal stenosis

C6-7: Negative for stenosis.

C7-T1: Negative
IMPRESSION: Post radiation changes in the bone marrow of the cervical spine.
Stable small areas of bone marrow edema and enhancement C5 through
T1 anteriorly which may be due to tumor which has been treated and
is stable.

Soft tissue mass to the right of the esophagus at the thoracic inlet
grossly stable from prior CT.

## 2021-05-31 MED ORDER — GADOBUTROL 1 MMOL/ML IV SOLN
9.0000 mL | Freq: Once | INTRAVENOUS | Status: AC | PRN
  Administered 2021-05-31: 9 mL via INTRAVENOUS

## 2021-06-08 ENCOUNTER — Other Ambulatory Visit: Payer: Self-pay | Admitting: Hospice and Palliative Medicine

## 2021-06-08 MED ORDER — HYDROCODONE-ACETAMINOPHEN 10-325 MG PO TABS: 1.0000 | ORAL_TABLET | Freq: Four times a day (QID) | ORAL | 0 refills | Status: DC | PRN

## 2021-06-08 NOTE — Progress Notes (Signed)
Patient spoke with clinic RN about pain and said that the oxycodone is contributing to "fogginess" and constipation. Patient requested to rotate to [facility] 10-325mg  as he has tried this and it has helped his pain without any adverse effects. Will send Rx.   PDMP reviewed.

## 2021-06-09 ENCOUNTER — Other Ambulatory Visit: Payer: Self-pay

## 2021-06-09 ENCOUNTER — Inpatient Hospital Stay: Payer: Medicare Other | Attending: Hospice and Palliative Medicine | Admitting: Hospice and Palliative Medicine

## 2021-06-09 DIAGNOSIS — C109 Malignant neoplasm of oropharynx, unspecified: Secondary | ICD-10-CM | POA: Diagnosis not present

## 2021-06-09 DIAGNOSIS — G620 Drug-induced polyneuropathy: Secondary | ICD-10-CM | POA: Insufficient documentation

## 2021-06-09 DIAGNOSIS — R200 Anesthesia of skin: Secondary | ICD-10-CM

## 2021-06-09 DIAGNOSIS — Z8042 Family history of malignant neoplasm of prostate: Secondary | ICD-10-CM | POA: Insufficient documentation

## 2021-06-09 DIAGNOSIS — T451X5A Adverse effect of antineoplastic and immunosuppressive drugs, initial encounter: Secondary | ICD-10-CM | POA: Insufficient documentation

## 2021-06-09 DIAGNOSIS — Z931 Gastrostomy status: Secondary | ICD-10-CM | POA: Insufficient documentation

## 2021-06-09 DIAGNOSIS — Z515 Encounter for palliative care: Secondary | ICD-10-CM | POA: Diagnosis not present

## 2021-06-09 DIAGNOSIS — Z9221 Personal history of antineoplastic chemotherapy: Secondary | ICD-10-CM | POA: Insufficient documentation

## 2021-06-09 DIAGNOSIS — Z8052 Family history of malignant neoplasm of bladder: Secondary | ICD-10-CM | POA: Insufficient documentation

## 2021-06-09 DIAGNOSIS — R1310 Dysphagia, unspecified: Secondary | ICD-10-CM | POA: Insufficient documentation

## 2021-06-09 DIAGNOSIS — Z808 Family history of malignant neoplasm of other organs or systems: Secondary | ICD-10-CM | POA: Insufficient documentation

## 2021-06-09 NOTE — Progress Notes (Signed)
Virtual Visit via Telephone Note  I connected with Ivan Garcia. on 06/09/21 at  2:00 PM EDT by telephone and verified that I am speaking with the correct person using two identifiers.  Location: Patient: Home Provider: Clinic   I discussed the limitations, risks, security and privacy concerns of performing an evaluation and management service by telephone and the availability of in person appointments. I also discussed with the patient that there may be a patient responsible charge related to this service. The patient expressed understanding and agreed to proceed.   History of Present Illness: Ivan Garcia. is a 65 y.o. 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  Most recently on docetaxel.    Is status post PEG.  Patient is referred to palliative care to help address goals and manage ongoing symptoms.   Observations/Objective: I called and spoke with patient by phone.  He reports that he is doing reasonably well but continues to have severe pain from peripheral neuropathy.  Patient's gabapentin was recently increased from 100 mg to 200 mg 3 times daily.  Today, I recommended that he increase gabapentin to 300 mg 3 times daily.  We also discussed possible option of starting duloxetine if no improvement on gabapentin.  Patient requested referral to neurology for evaluation of symptoms also.  Patient is currently pursuing care at [facility] with hope that he will qualify for clinical trial.  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  Patient is pursuing clinical trial at [facility] induced peripheral neuropathy  -increase gabapentin 300 mg 3 times daily.  Consider duloxetine if needed.  Will refer to neurology per patient's request.  Nutrition -has  PEG and is supplementing oral intake with PEG feedings.  He is followed by nutritionist.  Follow Up Instructions: Follow-up MyChart visit 1 to 2 months   I discussed the assessment and treatment plan with the patient. The patient was provided an opportunity to ask questions and all were answered. The patient agreed with the plan and demonstrated an understanding of the instructions.   The patient was advised to call back or seek an in-person evaluation if the symptoms worsen or if the condition fails to improve as anticipated.  I provided 10 minutes of non-face-to-face time during this encounter.   Irean Hong, NP

## 2021-06-10 ENCOUNTER — Ambulatory Visit: Payer: Medicare Other | Admitting: Internal Medicine

## 2021-06-14 ENCOUNTER — Telehealth: Payer: Self-pay

## 2021-06-14 NOTE — Telephone Encounter (Signed)
Nutrition Follow-up:    Patient with metastatic squamous cell carcinoma, p 16 +.  S/p PEG placement on 09/24/20.  Patient has stopped piqray, unable to tolerate cetuximab as went into cardiac arrest.    Patient has called RD on 7/5 with questions about upcoming appointment.  RD out of the office.  Called patient back today.  Patient said that he is planning PET scan on 7/14 to see if he will qualify for injection into tumor to stop growth at [facility].  Says that he is tolerating 4 feedings of [employer] 1.4 (reduced feeding due to weight gain).  Patient unable to take solid foods by mouth.  Using pedialyte (MD approved) for free water flush due to sodium level.    Received shipment of tube feeding formula and supplies from [place] recently.     Anthropometrics:   Weight 185-190 lb on home scale per patient  200 lb 11.2 oz on 6/22 200 lb on 6/16 198 lb on 5/3  188 lb on 3/31 185 lb on 3/3 182 lb on 1/18   NUTRITION DIAGNOSIS: Inadequate oral intake continues but relying on feeding tube   INTERVENTION:   Continue 4 cartons of [employer] 1.4 daily due to weight gain (no fluid collection).  Flush with 149ml pedialyte with each feeding.  Give additional 357ml fluid (water) to better meet hydration needs. Patient has contact information    MONITORING, EVALUATION, GOAL:  Weight trends, tube feeding tolerance   NEXT VISIT: phone call in 4-6 weeks  Bleu Moisan [name], [place], [place] Registered Dietitian [phone] (mobile)

## 2021-06-17 ENCOUNTER — Inpatient Hospital Stay (HOSPITAL_BASED_OUTPATIENT_CLINIC_OR_DEPARTMENT_OTHER): Payer: Medicare Other | Admitting: Internal Medicine

## 2021-06-17 VITALS — BP 118/85 | HR 105 | Temp 98.9°F | Resp 18 | Wt 197.5 lb

## 2021-06-17 DIAGNOSIS — T451X5A Adverse effect of antineoplastic and immunosuppressive drugs, initial encounter: Secondary | ICD-10-CM | POA: Diagnosis not present

## 2021-06-17 DIAGNOSIS — Z808 Family history of malignant neoplasm of other organs or systems: Secondary | ICD-10-CM | POA: Diagnosis not present

## 2021-06-17 DIAGNOSIS — Z931 Gastrostomy status: Secondary | ICD-10-CM | POA: Diagnosis not present

## 2021-06-17 DIAGNOSIS — R1310 Dysphagia, unspecified: Secondary | ICD-10-CM | POA: Diagnosis not present

## 2021-06-17 DIAGNOSIS — G620 Drug-induced polyneuropathy: Secondary | ICD-10-CM | POA: Diagnosis present

## 2021-06-17 DIAGNOSIS — C109 Malignant neoplasm of oropharynx, unspecified: Secondary | ICD-10-CM | POA: Diagnosis not present

## 2021-06-17 DIAGNOSIS — Z8042 Family history of malignant neoplasm of prostate: Secondary | ICD-10-CM | POA: Diagnosis not present

## 2021-06-17 DIAGNOSIS — Z8052 Family history of malignant neoplasm of bladder: Secondary | ICD-10-CM | POA: Diagnosis not present

## 2021-06-17 DIAGNOSIS — Z9221 Personal history of antineoplastic chemotherapy: Secondary | ICD-10-CM | POA: Diagnosis not present

## 2021-06-17 MED ORDER — PREGABALIN 75 MG PO CAPS: 75.0000 mg | ORAL_CAPSULE | Freq: Two times a day (BID) | ORAL | 2 refills | Status: DC

## 2021-06-17 NOTE — Progress Notes (Signed)
[facility] [place], [place] [address] [phone]   New Patient Evaluation  Date of Service: 06/17/21 Patient Name: Ivan Garcia [facility]. Patient MRN: [id number] Patient DOB: 10-02-1956 Provider: Ventura Sellers, MD  Identifying Statement:  Ivan Garcia. is a 65 y.o. male with neuropathy who presents for initial consultation and evaluation regarding cancer associated neurologic deficits.    Referring Provider: Albina Billet, MD [address]   [place],  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     09/29/2019 - 04/06/2020 Chemotherapy   The patient had palonosetron (ALOXI) injection 0.25 mg, 0.25 mg, Intravenous,  Once, 6 of 6 cycles Administration: 0.25 mg (09/29/2019), 0.25 mg (12/22/2019), 0.25 mg (10/20/2019), 0.25 mg (12/01/2019), 0.25 mg (11/10/2019), 0.25 mg (01/12/2020) pegfilgrastim (NEULASTA ONPRO KIT) injection 6 mg, 6 mg, Subcutaneous, Once, 2 of 2 cycles Administration: 6 mg (12/26/2019), 6 mg (01/16/2020) pembrolizumab (KEYTRUDA) 200 mg in sodium chloride 0.9 % 50 mL chemo infusion, 200 mg (100 % of original dose 200 mg), Intravenous, Once, 10 of 10 cycles Dose modification: 200 mg (original dose 200 mg, Cycle 1, Reason: Provider Judgment) Administration: 200 mg (09/29/2019), 200 mg (10/20/2019), 200 mg (12/01/2019), 200 mg (12/22/2019), 200 mg (11/10/2019), 200 mg (01/12/2020), 200 mg (02/02/2020), 200 mg (02/23/2020), 200 mg (03/15/2020), 200 mg (04/06/2020) fosaprepitant (EMEND) 150 mg, dexamethasone (DECADRON) 12 mg in sodium chloride 0.9 % 145 mL IVPB, , Intravenous,  Once, 6 of 6 cycles Administration:  (09/29/2019),   (12/22/2019),  (10/20/2019),  (12/01/2019),  (11/10/2019),  (01/12/2020) fluorouracil (ADRUCIL) 7,900 mg in sodium chloride 0.9 % 92 mL chemo infusion, 1,000 mg/m2/day = 7,900 mg, Intravenous, 4D (96 hours ), 6 of 6 cycles Dose modification: 900 mg/m2/day (original dose 1,000 mg/m2/day, Cycle 5, Reason: Dose not tolerated), 850 mg/m2/day (original dose 1,000 mg/m2/day, Cycle 5, Reason: Dose not tolerated) Administration: 7,900 mg (09/29/2019), 6,750 mg (12/22/2019), 7,900 mg (10/20/2019), 6,750 mg (12/01/2019), 7,150 mg (11/10/2019), 6,750 mg (01/12/2020) CARBOplatin (PARAPLATIN) 500 mg in sodium chloride 0.9 % 250 mL chemo infusion, 500 mg (100 % of original dose 499 mg), Intravenous,  Once, 6 of 6 cycles Dose modification:   (original dose 499 mg, Cycle 1),   (original dose 440 mg, Cycle 5) Administration: 500 mg (09/29/2019), 440 mg (10/20/2019), 390 mg (12/01/2019), 440 mg (11/10/2019), 430 mg (01/12/2020)   for chemotherapy treatment.     04/28/2020 - 04/28/2020 Chemotherapy   The patient had cetuximab (ERBITUX) chemo infusion 800 mg, 400 mg/m2, Intravenous, Once, 0 of 8 cycles   for chemotherapy treatment.     05/14/2020 - 05/14/2020 Chemotherapy   The patient had pembrolizumab (KEYTRUDA) 200 mg in sodium chloride 0.9 % 50 mL chemo infusion, 200 mg, Intravenous, Once, 1 of 4 cycles Administration: 200 mg (05/14/2020)   for chemotherapy treatment.     07/06/2020 -  Chemotherapy   The patient had palonosetron (ALOXI) injection 0.25 mg, 0.25 mg, Intravenous,  Once, 1 of 1 cycle Administration: 0.25 mg (09/07/2020) pegfilgrastim-jmdb (FULPHILA) injection 6 mg, 6 mg, Subcutaneous,  Once, 4 of 4 cycles Administration: 6 mg (07/08/2020), 6 mg (07/29/2020), 6 mg (08/19/2020), 6 mg (09/09/2020) CARBOplatin (PARAPLATIN) 430 mg in  sodium chloride 0.9 % 250 mL chemo infusion, 460 mg (100 % of original dose 464.5 mg), Intravenous,  Once, 1 of 1 cycle Dose modification:   (original dose 464.5 mg, Cycle  4) Administration: 430 mg (09/07/2020) DOCEtaxel (TAXOTERE) 150 mg in sodium chloride 0.9 % 250 mL chemo infusion, 75 mg/m2 = 150 mg, Intravenous,  Once, 4 of 4 cycles Administration: 150 mg (07/06/2020), 150 mg (07/27/2020), 150 mg (08/17/2020), 150 mg (09/07/2020) fosaprepitant (EMEND) 150 mg in sodium chloride 0.9 % 145 mL IVPB, 150 mg, Intravenous,  Once, 1 of 1 cycle Administration: 150 mg (09/07/2020)   for chemotherapy treatment.       History of Present Illness: The patient's records from the referring physician were obtained and reviewed and the patient interviewed to confirm this HPI.  Ivan Ghee Ivan Garcia. Describes a 2 months history of right hand and arm pain, numbness.  There is also component of right hand weakness.  Pain and numbness are described as affecting "all fingertips" and also the palm.  The forearm he describes as "some numbness, but not painful".  For pain, he has been taking gabapentin 277m TID which has been ineffective.  The 302mdose caused sedation. The right hand has been much weaker lately, he is unable to hold a pick and strum carefully on his guitar.  His ability to button his shirt and tie shoes has been compromised.  These issues all seem to be worsening in recent weeks, despite no significant (neuropathically relevant) chemotherapy exposure since last fall.  He has had multiple cancer related issues in recent weeks, including dysphagia requiring PEG, recent cardiac arrest.  [facility] Medications: Current Outpatient Medications on File Prior to Visit  Medication Sig Dispense Refill   HYDROcodone-acetaminophen (NORCO) 10-325 MG tablet Take 1 tablet by mouth every 6 (six) hours as needed. 60 tablet 0   lidocaine-prilocaine (EMLA) cream Apply to affected area once 30 g 3   omeprazole (PRILOSEC) 20 MG capsule Take 1 capsule (20 mg total) by mouth daily. 90 capsule 1   senna-docusate (SENOKOT-S) 8.6-50 MG tablet Take 2  tablets by mouth daily. 60 tablet 3   amLODipine (NORVASC) 5 MG tablet Take 5 mg by mouth daily. (Patient not taking: Reported on 06/17/2021)     atorvastatin (LIPITOR) 20 MG tablet SMARTSIG:1 Tablet(s) By Mouth Every Evening (Patient not taking: Reported on 06/17/2021)     metoprolol succinate (TOPROL-XL) 25 MG 24 hr tablet Take 1 tablet by mouth daily. (Patient not taking: Reported on 06/17/2021)     nystatin (MYCOSTATIN) 100000 UNIT/ML suspension Take 5 mLs (500,000 Units total) by mouth in the morning, at noon, and at bedtime. SWISH AND SPIT (Patient not taking: No sig reported) 473 mL 0   ondansetron (ZOFRAN) 8 MG tablet Take 1 tablet (8 mg total) by mouth 2 (two) times daily as needed for refractory nausea / vomiting. Start on day 3 after carboplatin chemo. (Patient not taking: No sig reported) 30 tablet 1   prochlorperazine (COMPAZINE) 10 MG tablet Take 1 tablet (10 mg total) by mouth every 6 (six) hours as needed (Nausea or vomiting). (Patient not taking: No sig reported) 30 tablet 1   Current Facility-Administered Medications on File Prior to Visit  Medication Dose Route Frequency Provider Last Rate Last Admin   sodium chloride flush (NS) 0.9 % injection 10 mL  10 mL Intravenous PRN YuEarlie ServerMD       sodium chloride flush (  NS) 0.9 % injection 10 mL  10 mL Intravenous PRN Earlie Server, MD   10 mL at 10/13/20 1034    Allergies:  Allergies  Allergen Reactions   Cetuximab Anaphylaxis   Past Medical History:  Past Medical History:  Diagnosis Date   Arthritis    Cancer ([place])    Head and neck cancer   Hemorrhoids    Hyperlipidemia    Hypertension    Past Surgical History:  Past Surgical History:  Procedure Laterality Date   COLONOSCOPY WITH PROPOFOL N/A 04/04/2017   Procedure: COLONOSCOPY WITH PROPOFOL;  Surgeon: Robert Bellow, MD;  Location: [facility] ENDOSCOPY;  Service: Endoscopy;  Laterality: N/A;   IR GASTROSTOMY TUBE MOD SED  09/24/2020   MANDIBLE SURGERY     PORT-A-CATH REMOVAL   07/17/2018   PORTA CATH INSERTION N/A 12/19/2017   Procedure: PORTA CATH INSERTION;  Surgeon: Algernon Huxley, MD;  Location: [facility];  Service: Cardiovascular;  Laterality: N/A;   PORTA CATH INSERTION N/A 07/17/2018   Procedure: PORTA CATH INSERTION;  Surgeon: Algernon Huxley, MD;  Location: [place];  Service: Cardiovascular;  Laterality: N/A;   PORTA CATH INSERTION N/A 09/18/2019   Procedure: PORTA CATH INSERTION;  Surgeon: Algernon Huxley, MD;  Location: [facility];  Service: Cardiovascular;  Laterality: N/A;   TONSILLECTOMY     Social History:  Social History   Socioeconomic History   Marital status: Single    Spouse name: Not on file   Number of children: Not on file   Years of education: Not on file   Highest education level: Not on file  Occupational History   Not on file  Tobacco Use   Smoking status: Never   Smokeless tobacco: Never  Vaping Use   Vaping Use: Never used  Substance and Sexual Activity   Alcohol use: No   Drug use: No   Sexual activity: Not on file  Other Topics Concern   Not on file  Social History Narrative   Not on file   Social Determinants of Health   Financial Resource Strain: Not on file  Food Insecurity: Not on file  Transportation Needs: Not on file  Physical Activity: Not on file  Stress: Not on file  Social Connections: Not on file  Intimate Partner Violence: Not on file   Family History:  Family History  Problem Relation Age of Onset   Breast cancer Mother    Asthma Mother    Congestive Heart Failure Mother    Prostate cancer Father    Brain cancer Father    Bladder Cancer Father     Review of Systems: Constitutional: Doesn't report fevers, chills or abnormal weight loss Eyes: Doesn't report blurriness of vision Ears, nose, mouth, throat, and face: Doesn't report sore throat Respiratory: Doesn't report cough, dyspnea or wheezes Cardiovascular: Doesn't report palpitation, chest discomfort   Gastrointestinal:  Doesn't report nausea, constipation, diarrhea GU: Doesn't report incontinence Skin: Doesn't report skin rashes Neurological: Per HPI Musculoskeletal: Doesn't report joint pain Behavioral/Psych: Doesn't report anxiety  Physical Exam: Vitals:   06/17/21 0906  BP: 118/85  Pulse: (!) 105  Resp: 18  Temp: 98.9 F (37.2 C)  SpO2: 97%   KPS: 70. General: Alert, cooperative, pleasant, in no acute distress Head: Normal EENT: No conjunctival injection or scleral icterus.  Lungs: Resp effort normal Cardiac: Regular rate Abdomen:PEG in place Skin: No rashes cyanosis or petechiae. Extremities: No clubbing or edema  Neurologic Exam: Mental  Status: Awake, alert, attentive to examiner. Oriented to self and environment. Language is fluent with intact comprehension.  Cranial Nerves: Visual acuity is grossly normal. Visual fields are full. Extra-ocular movements intact. No ptosis. Face is symmetric Motor: Tone and bulk are normal. Noted weakness in R thumb apposition. Reflexes are symmetric, no pathologic reflexes present.  Sensory: Patchy sensory impairment right hand Gait: Normal.   Labs: I have reviewed the data as listed    Component Value Date/Time   NA 129 (L) 05/18/2021 1320   K 4.2 05/18/2021 1320   CL 94 (L) 05/18/2021 1320   CO2 28 05/18/2021 1320   GLUCOSE 132 (H) 05/18/2021 1320   BUN 26 (H) 05/18/2021 1320   CREATININE 0.98 05/18/2021 1320   CALCIUM 8.9 05/18/2021 1320   PROT 6.5 05/18/2021 1320   ALBUMIN 3.6 05/18/2021 1320   AST 22 05/18/2021 1320   ALT 19 05/18/2021 1320   ALKPHOS 74 05/18/2021 1320   BILITOT 0.4 05/18/2021 1320   GFRNONAA >60 05/18/2021 1320   GFRAA 50 (L) 08/31/2020 1244   Lab Results  Component Value Date   WBC 12.0 (H) 05/18/2021   NEUTROABS 10.7 (H) 05/18/2021   HGB 13.0 05/18/2021   HCT 37.1 (L) 05/18/2021   MCV 92.1 05/18/2021   PLT 442 (H) 05/18/2021    Imaging:  MR Cervical Spine W Wo Contrast  Result  Date: 05/31/2021 CLINICAL DATA:  Oropharyngeal cancer with recurrence. C7 para esophageal tumor. Follow-up response to treatment. EXAM: MRI CERVICAL SPINE WITHOUT AND WITH CONTRAST TECHNIQUE: Multiplanar and multiecho pulse sequences of the cervical spine, to include the craniocervical junction and cervicothoracic junction, were obtained without and with intravenous contrast. CONTRAST:  83m GADAVIST GADOBUTROL 1 MMOL/ML IV SOLN COMPARISON:  MRI cervical spine 04/25/2021. CT neck soft tissues 04/01/2021 FINDINGS: Alignment: Normal Vertebrae: Bone marrow shows diffuse fatty replacement due to radiation in the neck. There are small areas of bone marrow edema and enhancement anteriorly at C5, C6, C7, and T1 unchanged from the recent MRI. This may be due to direct tumor invasion from the right para esophageal soft tissue mass previously noted at the thoracic inlet. No vertebral fracture. No new vertebral lesion. Cord: Normal signal and morphology Posterior Fossa, vertebral arteries, paraspinal tissues: Soft tissue mass to the right of the esophagus at the thoracic inlet again noted. This is difficult to compare with the prior CT but grossly appear similar. Disc levels: C2-3: Negative C3-4: Disc degeneration with uncinate spurring. Left-sided facet degeneration. Moderate left foraminal stenosis C4-5: Mild degenerative change.  Negative for stenosis C5-6: Diffuse uncinate spurring bilaterally causing moderate foraminal encroachment bilaterally. Mild spinal stenosis C6-7: Negative for stenosis. C7-T1: Negative IMPRESSION: Post radiation changes in the bone marrow of the cervical spine. Stable small areas of bone marrow edema and enhancement C5 through T1 anteriorly which may be due to tumor which has been treated and is stable. Soft tissue mass to the right of the esophagus at the thoracic inlet grossly stable from prior CT. Electronically Signed   By: CFranchot GalloM.D.   On: 05/31/2021 14:25       Assessment/Plan Chemotherapy-induced neuropathy ([place] [G62.0, T45.1X5A]  TRockney GheeHMaxwell Garcia presents with clinical syndrome localizing to sensory and motor nerve complex innervating right hand.  The syndrome is not classic, but has features consistent with carpal tunnel.  He does have nocturnal pain in the wrist and hand which is consistent.  This compression may have been chronic and underlying, and exacerbated by exposure to  neurotoxic chemotherapeutic agents.  It is not clear why symptoms have been progressive in recent weeks.   We recommended objective evaluation with EMG/NCS of the right hand and arm.  In the meantime, he will splint the right wrist at night to stabilize.  Script was provided today.   For pain symptoms, gabapentin was not effective and poorly tolerated.  We recommended trial of Lyrica 87m BID.  We spent twenty additional minutes teaching regarding the natural history, biology, and historical experience in the treatment of neurologic complications of cancer.   We appreciate the opportunity to participate in the care of T[employer] Platt.Marland KitchenHe will return to clinic in ~4 weeks after testing and trial of Lyrica.  All questions were answered. The patient knows to call the clinic with any problems, questions or concerns. No barriers to learning were detected.  The total time spent in the encounter was 40 minutes and more than 50% was on counseling and review of test results   ZVentura Sellers MD Medical Director of Neuro-Oncology [facility] [place] [place]07/15/22 12:14 PM

## 2021-06-17 NOTE — Progress Notes (Signed)
New patient visit for neuropathy. Pt reports tingling started in his right arm and hand about 2 months ago, and has been taking gabapentin. Is on 200mg  ofgabapentin now, but was not able to tolerate the increase to 300mg .

## 2021-06-20 ENCOUNTER — Telehealth: Payer: Self-pay | Admitting: *Deleted

## 2021-06-20 NOTE — Telephone Encounter (Signed)
Patient was seen by Dr Mickeal Skinner this past Friday at [facility].  He called to report that the pain is not better and wanted to know if a Cortizone shot in the arm and hand would be helpful in his case.  If so can he order it or refer him to appropriate physician that can do that.  States the pills aren't working as of yet and will continue to give it some time.    Noticed EMG has not been scheduled yet.  Sent referral to [facility].  Faxed to [phone]

## 2021-06-24 ENCOUNTER — Telehealth: Payer: Self-pay | Admitting: *Deleted

## 2021-06-24 NOTE — Telephone Encounter (Signed)
Called patient and told him to stop Lyrica per Dr. Mickeal Skinner.

## 2021-06-24 NOTE — Telephone Encounter (Signed)
Patient reports that since starting Lyrica last week his neuropathy symptoms have gotten worse he states he can barely use his right arm and hand.

## 2021-06-27 ENCOUNTER — Other Ambulatory Visit: Payer: Self-pay | Admitting: *Deleted

## 2021-06-27 DIAGNOSIS — T451X5A Adverse effect of antineoplastic and immunosuppressive drugs, initial encounter: Secondary | ICD-10-CM

## 2021-06-28 ENCOUNTER — Other Ambulatory Visit: Payer: Self-pay | Admitting: Hospice and Palliative Medicine

## 2021-06-28 ENCOUNTER — Telehealth: Payer: Self-pay | Admitting: *Deleted

## 2021-06-28 MED ORDER — HYDROCODONE-ACETAMINOPHEN 10-325 MG PO TABS: 1.0000 | ORAL_TABLET | Freq: Four times a day (QID) | ORAL | 0 refills | Status: DC | PRN

## 2021-06-28 NOTE — Telephone Encounter (Signed)
Patient called stating that [facility] was unable to get him on a clinical trial and patient is now asking to be referred to [facility] to get an a trial there

## 2021-06-28 NOTE — Progress Notes (Signed)
I spoke with patient by phone.  He will request refill of Norco.  Patient is also interested in scheduling follow-up visit with Dr. Tasia Catchings.  He reports that there were no clinical trials available to him [facility] or [facility].  Patient is questioning if any trials would be available at [facility].  Discussed with Dr. Tasia Catchings and will schedule MD follow-up.

## 2021-06-29 ENCOUNTER — Ambulatory Visit (INDEPENDENT_AMBULATORY_CARE_PROVIDER_SITE_OTHER): Payer: Medicare Other | Admitting: Family Medicine

## 2021-06-29 ENCOUNTER — Encounter: Payer: Self-pay | Admitting: Family Medicine

## 2021-06-29 ENCOUNTER — Other Ambulatory Visit: Payer: Self-pay

## 2021-06-29 DIAGNOSIS — R202 Paresthesia of skin: Secondary | ICD-10-CM

## 2021-06-29 DIAGNOSIS — R200 Anesthesia of skin: Secondary | ICD-10-CM

## 2021-06-29 NOTE — Telephone Encounter (Signed)
Dr. Tasia Catchings will discuss this with patient at next office visit.

## 2021-06-29 NOTE — Progress Notes (Signed)
Office Visit Note   Patient: Ivan Garcia.           Date of Birth: 02-20-1956           MRN: [id number] Visit Date: 06/29/2021 Requested by: Ventura Sellers, MD [place],  [place] [address] PCP: Albina Billet, MD  Subjective: Chief Complaint  Patient presents with   Right Shoulder - Pain    HPI: He is here with right arm numbness.  He is right-hand dominant.  He has a history of squamous cell carcinoma of the oropharynx.  It has come back 3 times.  He has been through radiation and chemotherapy.  Most recently he was on an experimental oral regimen.  About a month into it, he began feeling pain in the neck with a sensation of numbness and tingling into the right arm.  He underwent MRI scan of the neck in June which did not show any compressive lesions.  He was treated with Cymbalta which did not help, and now he is on Lyrica.  It does not seem to be helping either.  The symptoms are most pronounced in the hand, and especially on the palmar aspect of his fingers.  He has not noticed any weakness.  His neck pain seems to be better.  He never had a rash.  He denies any symptoms in his left hand, or his feet.                ROS:   All other systems were reviewed and are negative.  Objective: Vital Signs: There were no vitals taken for this visit.  Physical Exam:  General:  Alert and oriented, in no acute distress. Pulm:  Breathing unlabored. Psy:  Normal mood, congruent affect. Skin: No rash Right arm: He has fairly good neck range of motion, symmetric with negative Spurling's test.  Biceps, triceps, wrist and intrinsic hand strength are still normal.  He has a positive Tinel's at the right carpal tunnel, equivocal Phalen's test.  No atrophy of the hand muscles.    Imaging: No results found.  Assessment & Plan: Right hand numbness, not a typical pattern of neuropathy.  Question carpal tunnel syndrome. -Discussed options with him, and elected to inject  the carpal tunnel today.  If this does not help, then he already has a nerve conduction study scheduled for a few weeks from now.  He will proceed with that.     Procedures: Right wrist injection: After sterile prep with Betadine, injected 1 cc 1% lidocaine without epinephrine and 40 mg Depo-Medrol into the carpal tunnel.       PMFS History: Patient Active Problem List   Diagnosis Date Noted   Neuropathy due to chemotherapeutic drug ([place]) 05/27/2021   PEA (Pulseless electrical activity) ([place]) 05/27/2021   Chemotherapy-induced neuropathy ([place]) 07/14/2020   Bone pain 07/14/2020   Encounter for antineoplastic immunotherapy 04/27/2020   Stage 3a chronic kidney disease ([place]) 12/22/2019   Encounter for antineoplastic chemotherapy 10/06/2019   Neoplasm related pain 10/06/2019   Stage 3 chronic kidney disease ([place]) 03/24/2018   Edema 03/24/2018   Port-A-Cath in place 03/24/2018   ARF (acute renal failure) ([place]) 01/02/2018   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[place]) 12/14/2017   Goals of care, counseling/discussion 12/10/2017   Encounter for screening colonoscopy 02/28/2017   Past Medical History:  Diagnosis Date   Arthritis    Cancer ([place])    Head and neck cancer   Hemorrhoids  Hyperlipidemia    Hypertension     Family History  Problem Relation Age of Onset   Breast cancer Mother    Asthma Mother    Congestive Heart Failure Mother    Prostate cancer Father    Brain cancer Father    Bladder Cancer Father     Past Surgical History:  Procedure Laterality Date   COLONOSCOPY WITH PROPOFOL N/A 04/04/2017   Procedure: COLONOSCOPY WITH PROPOFOL;  Surgeon: Robert Bellow, MD;  Location: [facility] ENDOSCOPY;  Service: Endoscopy;  Laterality: N/A;   IR GASTROSTOMY TUBE MOD SED  09/24/2020   MANDIBLE SURGERY     PORT-A-CATH REMOVAL  07/17/2018   PORTA CATH INSERTION N/A 12/19/2017   Procedure: PORTA CATH INSERTION;  Surgeon: Algernon Huxley, MD;  Location: [place];   Service: Cardiovascular;  Laterality: N/A;   PORTA CATH INSERTION N/A 07/17/2018   Procedure: PORTA CATH INSERTION;  Surgeon: Algernon Huxley, MD;  Location: [place] [facility];  Service: Cardiovascular;  Laterality: N/A;   PORTA CATH INSERTION N/A 09/18/2019   Procedure: PORTA CATH INSERTION;  Surgeon: Algernon Huxley, MD;  Location: [place];  Service: Cardiovascular;  Laterality: N/A;   TONSILLECTOMY     Social History   Occupational History   Not on file  Tobacco Use   Smoking status: Never   Smokeless tobacco: Never  Vaping Use   Vaping Use: Never used  Substance and Sexual Activity   Alcohol use: No   Drug use: No   Sexual activity: Not on file

## 2021-06-30 ENCOUNTER — Encounter: Payer: Self-pay | Admitting: Oncology

## 2021-07-04 ENCOUNTER — Telehealth: Payer: Self-pay | Admitting: Oncology

## 2021-07-04 ENCOUNTER — Telehealth: Payer: Self-pay | Admitting: *Deleted

## 2021-07-04 ENCOUNTER — Telehealth: Payer: Self-pay

## 2021-07-04 ENCOUNTER — Telehealth: Payer: Self-pay | Admitting: Hospice and Palliative Medicine

## 2021-07-04 NOTE — Telephone Encounter (Signed)
Please advise 

## 2021-07-04 NOTE — Telephone Encounter (Signed)
Attempted to call x2. No answer.

## 2021-07-04 NOTE — Telephone Encounter (Signed)
Pt called stating that he never received any relief from the shot in his wrist.

## 2021-07-04 NOTE — Telephone Encounter (Signed)
Patient called reporting that he had injection by Ortho for his pain in his hand, but got no relief from his pain and is now thinking that he has a pinched nerve. He is requesting to see Dr Mickeal Skinner regarding this. Please advise

## 2021-07-04 NOTE — Telephone Encounter (Signed)
I called and advised the patient of this. He said he will continue to take the Lyrica in the meantime. His study is scheduled for 07/25/21 at [facility] in [place], but he is on the cancellation list for if something comes up sooner.

## 2021-07-04 NOTE — Telephone Encounter (Signed)
Dr. Mickeal Skinner, would you be able to see this pt in your clinic this week. You have seen him in the past.

## 2021-07-05 ENCOUNTER — Telehealth: Payer: Self-pay | Admitting: *Deleted

## 2021-07-05 ENCOUNTER — Encounter: Payer: Self-pay | Admitting: Oncology

## 2021-07-05 NOTE — Telephone Encounter (Signed)
Pt scheduled for EMG at [facility] neuro on 8/22.

## 2021-07-05 NOTE — Telephone Encounter (Signed)
Patient called stating that he needs a refill of his Lyrica, but he is also requesting an increase in the dose. Please advise

## 2021-07-05 NOTE — Telephone Encounter (Signed)
Good morning ladies, I do not see that EMG is scheduled yet. Can you guys please get EMG scheduled and move appt with Dr. Mickeal Skinner up to this week please. Please notify pt of appt.

## 2021-07-06 ENCOUNTER — Encounter: Payer: Self-pay | Admitting: Oncology

## 2021-07-06 NOTE — Telephone Encounter (Signed)
I called patient to inform him that he can discuss dose increase with Dr Mickeal Skinner Friday, and he informed me that her doubled his dose on his own last night and it helped. He had no pain and was able to walk in his driveway. I told him that I cannot condone his changing the dose and that it is not recommended to do so and he said he will discuss what he has done with Dr Mickeal Skinner Friday

## 2021-07-08 ENCOUNTER — Telehealth: Payer: Self-pay

## 2021-07-08 ENCOUNTER — Encounter: Payer: Self-pay | Admitting: Oncology

## 2021-07-08 ENCOUNTER — Inpatient Hospital Stay (HOSPITAL_BASED_OUTPATIENT_CLINIC_OR_DEPARTMENT_OTHER): Payer: Medicare Other | Admitting: Internal Medicine

## 2021-07-08 ENCOUNTER — Encounter: Payer: Self-pay | Admitting: Internal Medicine

## 2021-07-08 ENCOUNTER — Inpatient Hospital Stay: Payer: Medicare Other

## 2021-07-08 ENCOUNTER — Inpatient Hospital Stay: Payer: Medicare Other | Attending: Oncology | Admitting: Oncology

## 2021-07-08 VITALS — BP 104/74 | HR 100 | Temp 96.0°F | Resp 18 | Wt 192.0 lb

## 2021-07-08 DIAGNOSIS — T451X5A Adverse effect of antineoplastic and immunosuppressive drugs, initial encounter: Secondary | ICD-10-CM | POA: Insufficient documentation

## 2021-07-08 DIAGNOSIS — Z95828 Presence of other vascular implants and grafts: Secondary | ICD-10-CM

## 2021-07-08 DIAGNOSIS — G620 Drug-induced polyneuropathy: Secondary | ICD-10-CM

## 2021-07-08 DIAGNOSIS — M6281 Muscle weakness (generalized): Secondary | ICD-10-CM | POA: Diagnosis not present

## 2021-07-08 DIAGNOSIS — Z8042 Family history of malignant neoplasm of prostate: Secondary | ICD-10-CM | POA: Diagnosis not present

## 2021-07-08 DIAGNOSIS — Z5112 Encounter for antineoplastic immunotherapy: Secondary | ICD-10-CM | POA: Insufficient documentation

## 2021-07-08 DIAGNOSIS — N1831 Chronic kidney disease, stage 3a: Secondary | ICD-10-CM | POA: Diagnosis not present

## 2021-07-08 DIAGNOSIS — C109 Malignant neoplasm of oropharynx, unspecified: Secondary | ICD-10-CM

## 2021-07-08 DIAGNOSIS — Z931 Gastrostomy status: Secondary | ICD-10-CM | POA: Insufficient documentation

## 2021-07-08 DIAGNOSIS — C7800 Secondary malignant neoplasm of unspecified lung: Secondary | ICD-10-CM | POA: Diagnosis not present

## 2021-07-08 DIAGNOSIS — R200 Anesthesia of skin: Secondary | ICD-10-CM | POA: Insufficient documentation

## 2021-07-08 DIAGNOSIS — Z8052 Family history of malignant neoplasm of bladder: Secondary | ICD-10-CM | POA: Diagnosis not present

## 2021-07-08 DIAGNOSIS — Z803 Family history of malignant neoplasm of breast: Secondary | ICD-10-CM | POA: Diagnosis not present

## 2021-07-08 DIAGNOSIS — Z808 Family history of malignant neoplasm of other organs or systems: Secondary | ICD-10-CM | POA: Insufficient documentation

## 2021-07-08 DIAGNOSIS — M79601 Pain in right arm: Secondary | ICD-10-CM | POA: Diagnosis not present

## 2021-07-08 LAB — COMPREHENSIVE METABOLIC PANEL
ALT: 18 U/L (ref 0–44)
AST: 27 U/L (ref 15–41)
Albumin: 3.7 g/dL (ref 3.5–5.0)
Alkaline Phosphatase: 73 U/L (ref 38–126)
Anion gap: 8 (ref 5–15)
BUN: 28 mg/dL — ABNORMAL HIGH (ref 8–23)
CO2: 29 mmol/L (ref 22–32)
Calcium: 9.1 mg/dL (ref 8.9–10.3)
Chloride: 97 mmol/L — ABNORMAL LOW (ref 98–111)
Creatinine, Ser: 1.14 mg/dL (ref 0.61–1.24)
GFR, Estimated: >60 mL/min (ref >60)
Glucose, Bld: 131 mg/dL — ABNORMAL HIGH (ref 70–99)
Potassium: 4.4 mmol/L (ref 3.5–5.1)
Sodium: 134 mmol/L — ABNORMAL LOW (ref 135–145)
Total Bilirubin: 0.6 mg/dL (ref 0.3–1.2)
Total Protein: 6.6 g/dL (ref 6.5–8.1)

## 2021-07-08 LAB — CBC WITH DIFFERENTIAL/PLATELET
Abs Immature Granulocytes: 0.03 10*3/uL (ref 0.00–0.07)
Basophils Absolute: 0.1 10*3/uL (ref 0.0–0.1)
Basophils Relative: 1 %
Eosinophils Absolute: 0.1 10*3/uL (ref 0.0–0.5)
Eosinophils Relative: 1 %
HCT: 41.4 % (ref 39.0–52.0)
Hemoglobin: 13.7 g/dL (ref 13.0–17.0)
Immature Granulocytes: 0 %
Lymphocytes Relative: 3 %
Lymphs Abs: 0.3 10*3/uL — ABNORMAL LOW (ref 0.7–4.0)
MCH: 31.6 pg (ref 26.0–34.0)
MCHC: 33.1 g/dL (ref 30.0–36.0)
MCV: 95.4 fL (ref 80.0–100.0)
Monocytes Absolute: 0.6 10*3/uL (ref 0.1–1.0)
Monocytes Relative: 7 %
Neutro Abs: 7.8 10*3/uL — ABNORMAL HIGH (ref 1.7–7.7)
Neutrophils Relative %: 88 %
Platelets: 306 10*3/uL (ref 150–400)
RBC: 4.34 MIL/uL (ref 4.22–5.81)
RDW: 13.5 % (ref 11.5–15.5)
WBC: 9 10*3/uL (ref 4.0–10.5)
nRBC: 0 % (ref 0.0–0.2)

## 2021-07-08 MED ORDER — HEPARIN SOD (PORK) LOCK FLUSH 100 UNIT/ML IV SOLN
500.0000 [IU] | Freq: Once | INTRAVENOUS | Status: AC
  Administered 2021-07-08: 500 [IU] via INTRAVENOUS
  Filled 2021-07-08: qty 5

## 2021-07-08 MED ORDER — PREDNISONE 50 MG PO TABS: 50.0000 mg | ORAL_TABLET | Freq: Every day | ORAL | 0 refills | Status: DC

## 2021-07-08 MED ORDER — PREGABALIN 150 MG PO CAPS: 150.0000 mg | ORAL_CAPSULE | Freq: Two times a day (BID) | ORAL | 3 refills | Status: AC

## 2021-07-08 MED ORDER — SODIUM CHLORIDE 0.9% FLUSH
10.0000 mL | INTRAVENOUS | Status: DC | PRN
  Administered 2021-07-08: 10 mL via INTRAVENOUS
  Filled 2021-07-08: qty 10

## 2021-07-08 NOTE — Progress Notes (Signed)
[facility] [place] [place], [place] [address] [phone]   Interval Evaluation  Date of Service: 07/08/21 Patient Name: Ivan Garcia [facility]. Patient MRN: [id number] Patient DOB: [birth year] Provider: Ventura Sellers, MD  Identifying Statement:  Ivan Ghee Amish Mintzer. is a 65 y.o. 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     09/29/2019 - 04/06/2020 Chemotherapy   The patient had palonosetron (ALOXI) injection 0.25 mg, 0.25 mg, Intravenous,  Once, 6 of 6 cycles Administration: 0.25 mg (09/29/2019), 0.25 mg (12/22/2019), 0.25 mg (10/20/2019), 0.25 mg (12/01/2019), 0.25 mg (11/10/2019), 0.25 mg (01/12/2020) pegfilgrastim (NEULASTA ONPRO KIT) injection 6 mg, 6 mg, Subcutaneous, Once, 2 of 2 cycles Administration: 6 mg (12/26/2019), 6 mg (01/16/2020) pembrolizumab (KEYTRUDA) 200 mg in sodium chloride 0.9 % 50 mL chemo infusion, 200 mg (100 % of original dose 200 mg), Intravenous, Once, 10 of 10 cycles Dose modification: 200 mg (original dose 200 mg, Cycle 1, Reason: Provider Judgment) Administration: 200 mg (09/29/2019), 200 mg (10/20/2019), 200 mg (12/01/2019), 200 mg (12/22/2019), 200 mg (11/10/2019), 200 mg (01/12/2020), 200 mg (02/02/2020), 200 mg (02/23/2020), 200 mg (03/15/2020), 200 mg (04/06/2020) fosaprepitant (EMEND) 150 mg, dexamethasone (DECADRON) 12 mg in sodium chloride 0.9 % 145 mL IVPB, , Intravenous,  Once, 6 of 6 cycles Administration:  (09/29/2019),  (12/22/2019),  (10/20/2019),  (12/01/2019),  (11/10/2019),  (01/12/2020) fluorouracil (ADRUCIL) 7,900 mg in sodium chloride 0.9 % 92 mL chemo infusion, 1,000 mg/m2/day = 7,900 mg, Intravenous, 4D  (96 hours ), 6 of 6 cycles Dose modification: 900 mg/m2/day (original dose 1,000 mg/m2/day, Cycle 5, Reason: Dose not tolerated), 850 mg/m2/day (original dose 1,000 mg/m2/day, Cycle 5, Reason: Dose not tolerated) Administration: 7,900 mg (09/29/2019), 6,750 mg (12/22/2019), 7,900 mg (10/20/2019), 6,750 mg (12/01/2019), 7,150 mg (11/10/2019), 6,750 mg (01/12/2020) CARBOplatin (PARAPLATIN) 500 mg in sodium chloride 0.9 % 250 mL chemo infusion, 500 mg (100 % of original dose 499 mg), Intravenous,  Once, 6 of 6 cycles Dose modification:   (original dose 499 mg, Cycle 1),   (original dose 440 mg, Cycle 5) Administration: 500 mg (09/29/2019), 440 mg (10/20/2019), 390 mg (12/01/2019), 440 mg (11/10/2019), 430 mg (01/12/2020)   for chemotherapy treatment.     04/28/2020 - 04/28/2020 Chemotherapy   The patient had cetuximab (ERBITUX) chemo infusion 800 mg, 400 mg/m2, Intravenous, Once, 0 of 8 cycles   for chemotherapy treatment.     05/14/2020 - 05/14/2020 Chemotherapy   The patient had pembrolizumab (KEYTRUDA) 200 mg in sodium chloride 0.9 % 50 mL chemo infusion, 200 mg, Intravenous, Once, 1 of 4 cycles Administration: 200 mg (05/14/2020)   for chemotherapy treatment.     07/06/2020 -  Chemotherapy   The patient had palonosetron (ALOXI) injection 0.25 mg, 0.25 mg, Intravenous,  Once, 1 of 1 cycle Administration: 0.25 mg (09/07/2020) pegfilgrastim-jmdb (FULPHILA) injection 6 mg, 6 mg, Subcutaneous,  Once, 4 of 4 cycles Administration: 6 mg (07/08/2020), 6 mg (07/29/2020), 6 mg (08/19/2020), 6 mg (09/09/2020) CARBOplatin (PARAPLATIN) 430 mg in sodium chloride 0.9 % 250 mL chemo infusion, 460 mg (100 % of original dose 464.5 mg), Intravenous,  Once, 1 of 1 cycle Dose modification:   (original dose 464.5 mg,  Cycle 4) Administration: 430 mg (09/07/2020) DOCEtaxel (TAXOTERE) 150 mg in sodium chloride 0.9 % 250 mL chemo infusion, 75 mg/m2 = 150 mg, Intravenous,  Once, 4 of 4 cycles Administration: 150 mg (07/06/2020), 150  mg (07/27/2020), 150 mg (08/17/2020), 150 mg (09/07/2020) fosaprepitant (EMEND) 150 mg in sodium chloride 0.9 % 145 mL IVPB, 150 mg, Intravenous,  Once, 1 of 1 cycle Administration: 150 mg (09/07/2020)   for chemotherapy treatment.       Interval History: Ivan Gosse. presents today for follow up after recent interventions for neuropathy.  He describes modest improvement with the 43m dose of Lyrica, but significant improvement with the 1578mtwice per day dose.  He did receive a steroid injection in his wrist this past week, which did not alleviate symptoms at all.  Otherwise still continues to complains of pain, numbness and weakness in the right fingers and palm.  EMG pending for 07/25/21.   H+P (06/17/21) Patient describes a 2 months history of right hand and arm pain, numbness.  There is also component of right hand weakness.  Pain and numbness are described as affecting "all fingertips" and also the palm.  The forearm he describes as "some numbness, but not painful".  For pain, he has been taking gabapentin 20091mID which has been ineffective.  The 300m64mse caused sedation. The right hand has been much weaker lately, he is unable to hold a pick and strum carefully on his guitar.  His ability to button his shirt and tie shoes has been compromised.  These issues all seem to be worsening in recent weeks, despite no significant (neuropathically relevant) chemotherapy exposure since last fall.  He has had multiple cancer related issues in recent weeks, including dysphagia requiring PEG, recent cardiac arrest.  [facility] Medications on File Prior to Visit  Medication Sig Dispense Refill   amLODipine (NORVASC) 5 MG tablet Take 5 mg by mouth daily. (Patient not taking: Reported on 06/17/2021)     atorvastatin (LIPITOR) 20 MG tablet SMARTSIG:1 Tablet(s) By Mouth Every Evening (Patient not taking: Reported on 06/17/2021)      HYDROcodone-acetaminophen (NORCO) 10-325 MG tablet Take 1 tablet by mouth every 6 (six) hours as needed. 60 tablet 0   lidocaine-prilocaine (EMLA) cream Apply to affected area once 30 g 3   metoprolol succinate (TOPROL-XL) 25 MG 24 hr tablet Take 1 tablet by mouth daily. (Patient not taking: Reported on 06/17/2021)     nystatin (MYCOSTATIN) 100000 UNIT/ML suspension Take 5 mLs (500,000 Units total) by mouth in the morning, at noon, and at bedtime. SWISH AND SPIT (Patient not taking: No sig reported) 473 mL 0   omeprazole (PRILOSEC) 20 MG capsule Take 1 capsule (20 mg total) by mouth daily. 90 capsule 1   ondansetron (ZOFRAN) 8 MG tablet Take 1 tablet (8 mg total) by mouth 2 (two) times daily as needed for refractory nausea / vomiting. Start on day 3 after carboplatin chemo. (Patient not taking: No sig reported) 30 tablet 1   pregabalin (LYRICA) 75 MG capsule Take 1 capsule (75 mg total) by mouth 2 (two) times daily. 60 capsule 2   prochlorperazine (COMPAZINE) 10 MG tablet Take 1 tablet (10 mg total) by mouth every 6 (six) hours as needed (Nausea or vomiting). (Patient not taking: No sig reported) 30 tablet 1   senna-docusate (SENOKOT-S) 8.6-50 MG tablet Take 2 tablets by mouth daily. 60 tablet 3  Current Facility-Administered Medications on File Prior to Visit  Medication Dose Route Frequency Provider Last Rate Last Admin   sodium chloride flush (NS) 0.9 % injection 10 mL  10 mL Intravenous PRN Earlie Server, MD       sodium chloride flush (NS) 0.9 % injection 10 mL  10 mL Intravenous PRN Earlie Server, MD   10 mL at 10/13/20 1034    Allergies:  Allergies  Allergen Reactions   Cetuximab Anaphylaxis   Past Medical History:  Past Medical History:  Diagnosis Date   Arthritis    Cancer ([place])    Head and neck cancer   Hemorrhoids    Hyperlipidemia    Hypertension    Past Surgical History:  Past Surgical History:  Procedure Laterality Date   COLONOSCOPY WITH PROPOFOL N/A 04/04/2017   Procedure:  COLONOSCOPY WITH PROPOFOL;  Surgeon: Robert Bellow, MD;  Location: [facility] ENDOSCOPY;  Service: Endoscopy;  Laterality: N/A;   IR GASTROSTOMY TUBE MOD SED  09/24/2020   MANDIBLE SURGERY     PORT-A-CATH REMOVAL  07/17/2018   PORTA CATH INSERTION N/A 12/19/2017   Procedure: PORTA CATH INSERTION;  Surgeon: Algernon Huxley, MD;  Location: [facility];  Service: Cardiovascular;  Laterality: N/A;   PORTA CATH INSERTION N/A 07/17/2018   Procedure: PORTA CATH INSERTION;  Surgeon: Algernon Huxley, MD;  Location: [facility];  Service: Cardiovascular;  Laterality: N/A;   PORTA CATH INSERTION N/A 09/18/2019   Procedure: PORTA CATH INSERTION;  Surgeon: Algernon Huxley, MD;  Location: [facility];  Service: Cardiovascular;  Laterality: N/A;   TONSILLECTOMY     Social History:  Social History   Socioeconomic History   Marital status: Single    Spouse name: Not on file   Number of children: Not on file   Years of education: Not on file   Highest education level: Not on file  Occupational History   Not on file  Tobacco Use   Smoking status: Never   Smokeless tobacco: Never  Vaping Use   Vaping Use: Never used  Substance and Sexual Activity   Alcohol use: No   Drug use: No   Sexual activity: Not on file  Other Topics Concern   Not on file  Social History Narrative   Not on file   Social Determinants of Health   Financial Resource Strain: Not on file  Food Insecurity: Not on file  Transportation Needs: Not on file  Physical Activity: Not on file  Stress: Not on file  Social Connections: Not on file  Intimate Partner Violence: Not on file   Family History:  Family History  Problem Relation Age of Onset   Breast cancer Mother    Asthma Mother    Congestive Heart Failure Mother    Prostate cancer Father    Brain cancer Father    Bladder Cancer Father     Review of Systems: Constitutional: Doesn't report fevers, chills or abnormal weight loss Eyes: Doesn't  report blurriness of vision Ears, nose, mouth, throat, and face: Doesn't report sore throat Respiratory: Doesn't report cough, dyspnea or wheezes Cardiovascular: Doesn't report palpitation, chest discomfort  Gastrointestinal:  Doesn't report nausea, constipation, diarrhea GU: Doesn't report incontinence Skin: Doesn't report skin rashes Neurological: Per HPI Musculoskeletal: Doesn't report joint pain Behavioral/Psych: Doesn't report anxiety  Physical Exam: Vitals:   07/08/21 0927  BP: 104/74  Pulse: 100  Resp: 18  Temp: (!) 96 F (35.6 C)  SpO2: 96%  KPS: 70. General: Alert, cooperative, pleasant, in no acute distress Head: Normal EENT: No conjunctival injection or scleral icterus.  Lungs: Resp effort normal Cardiac: Regular rate Abdomen:PEG in place Skin: No rashes cyanosis or petechiae. Extremities: No clubbing or edema  Neurologic Exam: Mental Status: Awake, alert, attentive to examiner. Oriented to self and environment. Language is fluent with intact comprehension.  Cranial Nerves: Visual acuity is grossly normal. Visual fields are full. Extra-ocular movements intact. No ptosis. Face is symmetric Motor: Tone and bulk are normal. Noted weakness in R thumb apposition. Reflexes are symmetric, no pathologic reflexes present.  Sensory: Patchy sensory impairment right hand Gait: Normal.   Labs: I have reviewed the data as listed    Component Value Date/Time   NA 129 (L) 05/18/2021 1320   K 4.2 05/18/2021 1320   CL 94 (L) 05/18/2021 1320   CO2 28 05/18/2021 1320   GLUCOSE 132 (H) 05/18/2021 1320   BUN 26 (H) 05/18/2021 1320   CREATININE 0.98 05/18/2021 1320   CALCIUM 8.9 05/18/2021 1320   PROT 6.5 05/18/2021 1320   ALBUMIN 3.6 05/18/2021 1320   AST 22 05/18/2021 1320   ALT 19 05/18/2021 1320   ALKPHOS 74 05/18/2021 1320   BILITOT 0.4 05/18/2021 1320   GFRNONAA >60 05/18/2021 1320   GFRAA 50 (L) 08/31/2020 1244   Lab Results  Component Value Date   WBC 12.0  (H) 05/18/2021   NEUTROABS 10.7 (H) 05/18/2021   HGB 13.0 05/18/2021   HCT 37.1 (L) 05/18/2021   MCV 92.1 05/18/2021   PLT 442 (H) 05/18/2021    Assessment/Plan 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 presents with clinical syndrome localizing to sensory and motor nerve complex innervating right hand.  The syndrome is not classic, but has features consistent with carpal tunnel.  He does have nocturnal pain in the wrist and hand which is consistent.  This compression may have been chronic and underlying, and exacerbated by exposure to neurotoxic chemotherapeutic agents.  It is not clear why symptoms have been progressive in recent weeks.   Although still at top of differential, we would have expected clinical improvement with steroid injection with severe carpal tunnel.  In addition, Phalen's test was negative at bedside today.  He is still pending EMG/NCS of the right hand and arm, scheduled for 8/22.  In the meantime, we re-emphasized need to splint the right wrist at night to stabilize.  He should do this consistently for at least a month before considered ineffective.  For pain symptoms, ok to increase Lyrica to 171m BID.  In the interim we will also provide trial of oral corticosteroids, prednisone 567mdaily x7 days.  We appreciate the opportunity to participate in the care of Ivan Garcia Ivan Garcia will return to clinic following his EMG.  All questions were answered. The patient knows to call the clinic with any problems, questions or concerns. No barriers to learning were detected.  The total time spent in the encounter was 30 minutes and more than 50% was on counseling and review of test results   Ivan SellersMD Medical Director of Neuro-Oncology [facility] [place]8/05/22 9:26 AM

## 2021-07-08 NOTE — Progress Notes (Signed)
Patient tolerated port flush well today, port flushes well. Blood return noted/labs drawn. No concerns voiced, patient discharged, stable.

## 2021-07-08 NOTE — Progress Notes (Signed)
Hematology/Oncology  Follow Up note [facility] Telephone:([phone]) [phone] [phone]   Patient Care Team: Albina Billet, MD as PCP - General (Internal Medicine) Albina Billet, MD (Internal Medicine) Bary Castilla, Forest Gleason, MD (General Surgery) Noreene Filbert, MD as Referring Physician (Radiation Oncology) Earlie Server, MD as Consulting Physician (Oncology)  CHIEF COMPLAINTS/PURPOSE OF CONSULTATION:  Follow up for head and neck cancer.  HISTORY OF PRESENTING ILLNESS:  Ivan Garcia. is a  65 y.o.  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 P16 positive. cT2 cN2 disease, stage II, # 12/13/2017: PET whole body: Primary hypermetabolic mucosal lesion in the left oropharynx/tongue base with right-sided bulky multilevel lymphadenopathy. #12/31/2017. Concurrent ChemoRT [Cisplatin 100 mg/m2 q3weeks]  S/p one dose of Cisplatin. Cisplatin discontinued due to nephrotoxicity/AKI . switch to weekly Carboplatin (AUC 2) / Taxol 104m/m2 [ finished May 2019] # 03/08/2018: PET image skull base to midthigh restaging: Near complete resolution of prior large right neck mass. Small right cervical/supraclavicular nodal metastases measuring up to 10 mm short axis. No evidence of distant metastases.  # 07/02/2018 PET No evidence of residual carcinoma in the neck. No evidence of nodal metastasis. No evidence distant metastatic disease  # July 2020, he started to have hoarseness after singing for 2 to 3 hours. He has changed his insurance and Dr. MTami Ribasis no longer covered by his current insurance.   08/04/2019 [place] . Flexible laryngoscopy showed Vocal cord paralysis.  Patient was recommended to have a PET scan done for restaging. Patient's primary care provider Dr. THall Busingordered a PET scan for  patient which was done on 08/20/2019. 08/20/2019 PET scan showed asymmetric hypermetabolic  involving the left vocal cords with hypermetabolic adenopathy in the neck, and the chest.  He also has hypermetabolic pulmonary nodule along the minor fissure which is felt to be metastatic.  # 09/08/2019   ultrasound-guided right supraclavicular lymph node biopsy Pathology was positive for metastatic squamous cell carcinoma. # 09/08/2019 Omniseq NGS showed PD-L1 CPS 20, TMB indeterminant, MSI stable,PIK3CA [id number] # 09/29/2019- 01/2020  6 cycles of carboplatin, 5-FU and Keytruda, finished in February 2021.  #12/18/2019, PET/CT: marked improvement in met disease, continued on Keytruda maintenance. # 04/26/2020 PET worsening of disease in the chest.  Interval enlargement of the right thoracic inlet and paratracheal lymph nodes that were previously enlarged with new activity in the right hilum corresponding to a lymph node in this location.  Also increased activity in the right upper lobe nodule. Right level 3 lymph node in the neck and anterior mediastinal nodal activity is diminished.  These areas are quite small on prior study. Diminished symmetric uptake within the left as compared to the right vocal cord. # [facility].  Patient was offered for clinical trial with pembrolizumab/lenvatinib versus standard therapy.  05/14/2021 Patient received additional 1 cycle of Keytruda while waiting to be enrolled to the trial.   #06/24/2020, [facility] neck [facility] right upper paratracheal node is concerning for metastatic disease and a new from 01/20/2020 PET/CT.  The node closely abuts and possibly invade the posterolateral right tracheal wall as well as the esophagus.  Right vocal cord paralysis.  Symmetric soft tissue density at the right base of  tongue could reflect residual/recurrent primary malignancy versus posttreatment changes.   Right supraclavicular lymph node is no longer appreciated with post treatment changes.  There is increased size of right minor fissure nodule which was metabolically avid on previous PET scan.  New mediastinal and hilar lymphadenopathy concerning for new nodal disease.  No evidence of metastatic disease below the diaphragm.  Nonobstructing 3 mm distal ureter stone.- Disease progression.  Not eligible for the clinical trial at [facility]  # 07/07/2020 09/02/2020 palliative chemotherapy with 3 cycles of docetaxel  with G-CSF support.   # 09/02/2020,chest without contrast with 3D MIPS protocol showed Interval increase in the size of the right upper lobe perifissural nodule 1.6 x 1.5 cm-previously 1.4 x 1.2 cm tracheoesophageal soft tissue mass 3.4 x 5.0 cm with regional mass-effect on the esophagus-previously 2.4 x 3.6 cm, and mediastinal lymphadenopathy-right upper paratracheal lymph node 1.9 cm-previously 1.5 cm, suspicious for progressive metastatic disease.Subcentimeter lung nodules are stable. CT neck with contrast showed slight interval increase in size of upper right paratracheal nodal conglomerate.  Questionable soft tissue invasion of the posterior tracheal appears worsened.  Compression with potential invasion of the esophagus is again noted.  Prior vocal cord paralysis again noted.  Unchanged right tongue base asymmetry. I had a discussion with patient's [facility] oncologist Dr. Barrington Ellison over the phone. Dr.Choe recommended adding carboplatin AUC 5 to docetaxel regimen for now which hopefully to help to reduce the size of the tracheoesophageal soft tissue mass.  Also recommend palliative radiation. # 09/07/2020, carboplatin AUC 5+ docetaxel # 10/21/2021S/p PEG tube placement   #09/28/2020- 10/25/20 patient finished palliative radiation to esophageal mass. #Patient establish care with [facility] Dr. Maxie Better   09/29/2020  guardant 360 which showed PIK3CA E545K, PIK3CA amplification. MSI stable, APC E2172K VUS , APC  L2138L, FGFR2 R573 VUS   . # 11/09/2020, patient had a PET scan which showed partial response Right thoracic inlet Hypermetabolic soft tissue metastasis, 3.9 x 2.6cm, decreased in size, decreased size of  Hypermetabolic right paratracheal 1.2cm, and right hilar nodal Metastases since outside 09/02/2020 neck CT Right upper lobe lung nodule 1.2cm, stable size.  RadOnc Dr. Baruch Gouty recommend additional radiation. #11/23/20-12/17/19 patient finished additional radiation # 12/22/2020,Piqray 312m daily  discontinued on 03/31/2021 04/01/2021 CT Neck/Chest abdomen pelvis  Progressive enlargement of the right larynx and supraglottic tissues with low-density most compatible with edema from radiation. Correlate with direct visualization mucosa to evaluate for tumor recurrence in this area Right paraesophageal lymph node mass is ill-defined and unchanged in size. Possible invasion of the esophagus.  # [facility] day 1 on 05/04/2021.  Unfortunately patient developed grade 4 infusion related reaction, syncope while on toilet which progressed to cardiopulmonary arrest requiring CPR for 30 to 60 seconds until ROSC, epinephrine, sent to emergency room and he was admitted for 24 hours.  He has sinus tachycardia and had a TEE done which normal LVEF.  [facility]  Patient was seen by Dr.Lycan on 05/13/2021, was recommended to allow cetuximab washout and also recently being informed that he is not eligible for another clinical trial at this point.  INTERVAL HISTORY TOland ArquetteHJoshwa Hemric is a 65y.o. male who presents for follow-up for metastatic squamous cell carcinoma, P 16+.   Patient establish care with Neuooncology Dr. VMickeal Skinnerduring the interval.  Started on Lyrica which helps his neuropathy symptom of right hand.  An EMG/NCS  of the right hand and arm.  Scheduled on 8/22. Patient also underwent steroid  injection, with one of the differential diagnosis being carpal tunnel syndrome.  Patient reports no significant improvement after steroid injection.  Lyrica was increased to 100 mg twice daily per Dr. Mickeal Skinner.  Review of Systems  Constitutional:  Negative for chills, fever, malaise/fatigue and weight loss.  HENT:  Negative for sore throat.        Dysphagia   Eyes:  Negative for redness.  Respiratory:  Negative for cough, shortness of breath and wheezing.   Cardiovascular:  Negative for chest pain, palpitations and leg swelling.  Gastrointestinal:  Negative for abdominal pain, blood in stool, nausea and vomiting.  Genitourinary:  Negative for dysuria.  Musculoskeletal:  Negative for myalgias.  Skin:  Negative for rash.  Neurological:  Negative for dizziness, tingling and tremors.  Endo/Heme/Allergies:  Does not bruise/bleed easily.  Psychiatric/Behavioral:  Negative for hallucinations.    MEDICAL HISTORY:  Past Medical History:  Diagnosis Date   Arthritis    Cancer ([place])    Head and neck cancer   Hemorrhoids    Hyperlipidemia    Hypertension     SURGICAL HISTORY: Past Surgical History:  Procedure Laterality Date   COLONOSCOPY WITH PROPOFOL N/A 04/04/2017   Procedure: COLONOSCOPY WITH PROPOFOL;  Surgeon: Robert Bellow, MD;  Location: [facility] ENDOSCOPY;  Service: Endoscopy;  Laterality: N/A;   IR GASTROSTOMY TUBE MOD SED  09/24/2020   MANDIBLE SURGERY     PORT-A-CATH REMOVAL  07/17/2018   PORTA CATH INSERTION N/A 12/19/2017   Procedure: PORTA CATH INSERTION;  Surgeon: Algernon Huxley, MD;  Location: [place] CV LAB;  Service: Cardiovascular;  Laterality: N/A;   PORTA CATH INSERTION N/A 07/17/2018   Procedure: PORTA CATH INSERTION;  Surgeon: Algernon Huxley, MD;  Location: [place] CV LAB;  Service: Cardiovascular;  Laterality: N/A;   PORTA CATH INSERTION N/A 09/18/2019   Procedure: PORTA CATH INSERTION;  Surgeon: Algernon Huxley, MD;  Location: [place] CV LAB;  Service:  Cardiovascular;  Laterality: N/A;   TONSILLECTOMY      SOCIAL HISTORY: Social History   Socioeconomic History   Marital status: Single    Spouse name: Not on file   Number of children: Not on file   Years of education: Not on file   Highest education level: Not on file  Occupational History   Not on file  Tobacco Use   Smoking status: Never   Smokeless tobacco: Never  Vaping Use   Vaping Use: Never used  Substance and Sexual Activity   Alcohol use: No   Drug use: No   Sexual activity: Not on file  Other Topics Concern   Not on file  Social History Narrative   Not on file   Social Determinants of Health   Financial Resource Strain: Not on file  Food Insecurity: Not on file  Transportation Needs: Not on file  Physical Activity: Not on file  Stress: Not on file  Social Connections: Not on file  Intimate Partner Violence: Not on file    FAMILY HISTORY: Family History  Problem Relation Age of Onset   Breast cancer Mother    Asthma Mother    Congestive Heart Failure Mother    Prostate cancer Father    Brain cancer Father    Bladder Cancer Father     ALLERGIES:  is allergic to cetuximab.  MEDICATIONS:  Current Outpatient Medications  Medication Sig Dispense Refill   HYDROcodone-acetaminophen (  NORCO) 10-325 MG tablet Take 1 tablet by mouth every 6 (six) hours as needed. 60 tablet 0   lidocaine-prilocaine (EMLA) cream Apply to affected area once 30 g 3   amLODipine (NORVASC) 5 MG tablet Take 5 mg by mouth daily. (Patient not taking: No sig reported)     atorvastatin (LIPITOR) 20 MG tablet SMARTSIG:1 Tablet(s) By Mouth Every Evening (Patient not taking: No sig reported)     metoprolol succinate (TOPROL-XL) 25 MG 24 hr tablet Take 1 tablet by mouth daily. (Patient not taking: No sig reported)     nystatin (MYCOSTATIN) 100000 UNIT/ML suspension Take 5 mLs (500,000 Units total) by mouth in the morning, at noon, and at bedtime. SWISH AND SPIT (Patient not taking: No sig  reported) 473 mL 0   omeprazole (PRILOSEC) 20 MG capsule Take 1 capsule (20 mg total) by mouth daily. (Patient not taking: No sig reported) 90 capsule 1   ondansetron (ZOFRAN) 8 MG tablet Take 1 tablet (8 mg total) by mouth 2 (two) times daily as needed for refractory nausea / vomiting. Start on day 3 after carboplatin chemo. (Patient not taking: No sig reported) 30 tablet 1   predniSONE (DELTASONE) 50 MG tablet Take 1 tablet (50 mg total) by mouth daily with breakfast. 7 tablet 0   pregabalin (LYRICA) 150 MG capsule Take 1 capsule (150 mg total) by mouth 2 (two) times daily. 60 capsule 3   prochlorperazine (COMPAZINE) 10 MG tablet Take 1 tablet (10 mg total) by mouth every 6 (six) hours as needed (Nausea or vomiting). (Patient not taking: No sig reported) 30 tablet 1   senna-docusate (SENOKOT-S) 8.6-50 MG tablet Take 2 tablets by mouth daily. (Patient not taking: No sig reported) 60 tablet 3   No current facility-administered medications for this visit.   Facility-Administered Medications Ordered in Other Visits  Medication Dose Route Frequency Provider Last Rate Last Admin   sodium chloride flush (NS) 0.9 % injection 10 mL  10 mL Intravenous PRN Earlie Server, MD       sodium chloride flush (NS) 0.9 % injection 10 mL  10 mL Intravenous PRN Earlie Server, MD   10 mL at 10/13/20 1034   sodium chloride flush (NS) 0.9 % injection 10 mL  10 mL Intravenous PRN Earlie Server, MD   10 mL at 07/08/21 1023     PHYSICAL EXAMINATION: ECOG PERFORMANCE STATUS: 1 - Symptomatic but completely ambulatory Vitals:   07/08/21 0945  BP: 104/74  Pulse: 100  Resp: 18  Temp: (!) 96 F (35.6 C)   Physical Exam Constitutional:      General: He is not in acute distress.    Appearance: He is not diaphoretic.  HENT:     Head: Normocephalic and atraumatic.     Nose: Nose normal.     Mouth/Throat:     Pharynx: No oropharyngeal exudate.  Eyes:     General: No scleral icterus.    Pupils: Pupils are equal, round, and  reactive to light.  Neck:     Comments: Right cervical lymphadenopathy Cardiovascular:     Rate and Rhythm: Normal rate and regular rhythm.     Heart sounds: No murmur heard. Pulmonary:     Effort: Pulmonary effort is normal. No respiratory distress.     Breath sounds: No rales.  Chest:     Chest wall: No tenderness.  Abdominal:     General: There is no distension.     Palpations: Abdomen is soft.  Tenderness: There is no abdominal tenderness.     Comments: +PEG tube  Musculoskeletal:        General: Normal range of motion.     Cervical back: Normal range of motion and neck supple.  Lymphadenopathy:     Cervical: Cervical adenopathy present.  Skin:    General: Skin is warm and dry.     Findings: No erythema.  Neurological:     Mental Status: He is alert and oriented to person, place, and time.     Cranial Nerves: No cranial nerve deficit.     Motor: No abnormal muscle tone.     Coordination: Coordination normal.  Psychiatric:        Mood and Affect: Affect normal.     LABORATORY DATA:  I have reviewed the data as listed Lab Results  Component Value Date   WBC 9.0 07/08/2021   HGB 13.7 07/08/2021   HCT 41.4 07/08/2021   MCV 95.4 07/08/2021   PLT 306 07/08/2021   Recent Labs    08/17/20 0804 08/23/20 1414 08/31/20 1244 09/07/20 0833 05/06/21 0312 05/18/21 1320 07/08/21 1025  NA 139 137 137   < > 138 129* 134*  K 4.0 3.6 4.5   < > 4.4 4.2 4.4  CL 105 100 98   < > 103 94* 97*  CO2 _0 < > _1 GLUCOSE 101* 129* 134*   < > 88 132* 131*  BUN 23 21 25*   < > 24* 26* 28*  CREATININE 1.52* 1.50* 1.64*   < > 1.34* 0.98 1.14  CALCIUM 9.2 8.9 9.3   < > 9.0 8.9 9.1  GFRNONAA 47* 48* 43*   < > 59* >60 >60  GFRAA 55* 56* 50*  --   --   --   --   PROT 7.4 7.0 7.3   < > 6.9 6.5 6.6  ALBUMIN 3.5 3.5 3.5   < > 3.8 3.6 3.7  AST _2 < > _3 ALT _4 < > _5 ALKPHOS 82 93 103   < > 51 74 73  BILITOT 0.7 0.8 0.6   < > 0.6 0.4 0.6    < > = values in this interval not displayed.   RADIOGRAPHIC STUDIES: I have personally reviewed the radiological images as listed and agreed with the findings in the report. MR Brain W Wo Contrast  Result Date: 04/10/2021 CLINICAL DATA:  History of head neck cancer with headache. EXAM: MRI HEAD WITHOUT AND WITH CONTRAST TECHNIQUE: Multiplanar, multiecho pulse sequences of the brain and surrounding structures were obtained without and with intravenous contrast. CONTRAST:  42m GADAVIST GADOBUTROL 1 MMOL/ML IV SOLN COMPARISON:  None similar FINDINGS: Brain: No acute for subacute infarction, hemorrhage, hydrocephalus, extra-axial collection or mass lesion. No swelling or enhancement to suggest metastatic disease. Vascular: Normal flow voids and vascular enhancements Skull and upper cervical spine: Normal marrow signal. Accentuated fatty marrow likely from radiation therapy Sinuses/Orbits: Mild mucosal thickening in the left maxillary sinus. Partial right mastoid opacification. These changes are chronic and may be post treatment. IMPRESSION: No evidence of metastatic disease. Electronically Signed   By: JMonte FantasiaM.D.   On: 04/10/2021 20:51   MR Cervical Spine W Wo Contrast  Result Date: 05/31/2021 CLINICAL DATA:  Oropharyngeal cancer with recurrence. C7 para esophageal tumor. Follow-up response to treatment. EXAM: MRI CERVICAL SPINE WITHOUT AND WITH CONTRAST  TECHNIQUE: Multiplanar and multiecho pulse sequences of the cervical spine, to include the craniocervical junction and cervicothoracic junction, were obtained without and with intravenous contrast. CONTRAST:  34m GADAVIST GADOBUTROL 1 MMOL/ML IV SOLN COMPARISON:  MRI cervical spine 04/25/2021. CT neck soft tissues 04/01/2021 FINDINGS: Alignment: Normal Vertebrae: Bone marrow shows diffuse fatty replacement due to radiation in the neck. There are small areas of bone marrow edema and enhancement anteriorly at C5, C6, C7, and T1 unchanged from the recent  MRI. This may be due to direct tumor invasion from the right para esophageal soft tissue mass previously noted at the thoracic inlet. No vertebral fracture. No new vertebral lesion. Cord: Normal signal and morphology Posterior Fossa, vertebral arteries, paraspinal tissues: Soft tissue mass to the right of the esophagus at the thoracic inlet again noted. This is difficult to compare with the prior CT but grossly appear similar. Disc levels: C2-3: Negative C3-4: Disc degeneration with uncinate spurring. Left-sided facet degeneration. Moderate left foraminal stenosis C4-5: Mild degenerative change.  Negative for stenosis C5-6: Diffuse uncinate spurring bilaterally causing moderate foraminal encroachment bilaterally. Mild spinal stenosis C6-7: Negative for stenosis. C7-T1: Negative IMPRESSION: Post radiation changes in the bone marrow of the cervical spine. Stable small areas of bone marrow edema and enhancement C5 through T1 anteriorly which may be due to tumor which has been treated and is stable. Soft tissue mass to the right of the esophagus at the thoracic inlet grossly stable from prior CT. Electronically Signed   By: CFranchot GalloM.D.   On: 05/31/2021 14:25   MR Cervical Spine W Wo Contrast  Result Date: 04/25/2021 CLINICAL DATA:  History of metastatic oropharyngeal squamous cell carcinoma. EXAM: MRI CERVICAL AND THORACIC SPINE WITHOUT AND WITH CONTRAST TECHNIQUE: Multiplanar and multiecho pulse sequences of the cervical spine, to include the craniocervical junction and cervicothoracic junction, and the thoracic spine, were obtained without and with intravenous contrast. CONTRAST:  9 mL GADAVIST IV SOLN COMPARISON:  Neck CT scan 04/01/2021. FINDINGS: MRI CERVICAL SPINE FINDINGS Alignment: Normal. Vertebrae: No fracture. Edema and enhancement are seen in the anterior aspects of the C5 and C6 vertebral bodies, more prominent to the right. More extensive edema and enhancement are seen in the anterior aspect of  C7 eccentric to the right. Cord: Cord signal is normal. No pathologic enhancement after contrast administration. No epidural tumor is seen. Posterior Fossa, vertebral arteries, paraspinal tissues: Soft tissue thickening and enhancement anterior to the cervical spine is noted but better seen on the prior neck CT. Disc levels: C2-3: Negative. C3-4: Mild disc bulge and uncovertebral disease. Mild to moderate facet degenerative disease is worse on the left. C4-5: Mild-to-moderate facet arthropathy and a shallow disc bulge. No stenosis. C5-6: There is a disc osteophyte complex and uncovertebral spurring. Mild central canal stenosis is present. Mild to moderate foraminal narrowing bilaterally. C6-7: Shallow disc bulge and uncovertebral disease. The central canal and left foramen are open. Mild to moderate right foraminal narrowing. C7-T1: Negative. MRI THORACIC SPINE FINDINGS Alignment:  Normal. Vertebrae: There is a small area of marrow edema and enhancement in the anterior aspect of T1 centrally and to the right. Marrow signal is otherwise normal. Cord: Normal signal throughout. No pathologic enhancement after contrast administration. No epidural tumor. Paraspinal and other soft tissues: Please see report of dedicated chest CT 04/01/2021. Small right pleural effusion noted. Disc levels: The central canal and foramina are widely patent at all levels. No disc protrusion is identified. IMPRESSION: Abnormal edema and enhancement in the anterior  aspect of C5, C6, C7 and T1 are most conspicuous at C7 and most consistent with metastatic disease. Soft tissue thickening and enhancement anterior to the mid and lower cervical spine is noted as reported on prior neck CT. Small right pleural effusion. Mild cervical spondylosis as described above. Electronically Signed   By: Inge Rise M.D.   On: 04/25/2021 11:51   MR Thoracic Spine W Wo Contrast  Result Date: 04/25/2021 : Please see report dictated under accession  [id number] [facility] Electronically Signed   By: Inge Rise M.D.   On: 04/25/2021 12:00      ASSESSMENT & PLAN:   1. Chemotherapy-induced neuropathy ([place])   2. 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[place])   3. Port-A-Cath in place   4. 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 Patient prefers to be referred to [facility] for further evaluation.  Referral sent. Discussed with patient about possible next 's of treatment.  Consider Xeloda or Afatinib.  # Dysphagia + PEG, follow-up with nutritionist. # Neoplasm associated pain, continue current regimen at [place] 10/325 mg every 6 hours as needed # Right upper extremity neuropathy, I discussed with Dr.Vaslow.  Lyrica 127m BID and trials of prednisone. pending EMG/NCS of the right hand  #Port-A-Cath in place, medi port flush Q8 weeks if not plan to use here.   Follow-up: to be determined.   ZEarlie Server MD, PhD Hematology [facility]cology [facility]8/04/2021

## 2021-07-08 NOTE — Telephone Encounter (Signed)
Fax sent to Radiology [employer] at [facility] requesting PET scan images from 06/16/21 be uploaded to [facility] powershare/ [facility]. Also LVM with this request.   Ph: [phone] fax: [phone] / [phone]

## 2021-07-12 ENCOUNTER — Ambulatory Visit
Admission: RE | Admit: 2021-07-12 | Discharge: 2021-07-12 | Disposition: A | Discharge status: Home / Self Care | Payer: Self-pay | Source: Ambulatory Visit | Attending: Oncology | Admitting: Oncology

## 2021-07-12 ENCOUNTER — Other Ambulatory Visit: Payer: Self-pay

## 2021-07-12 DIAGNOSIS — C109 Malignant neoplasm of oropharynx, unspecified: Secondary | ICD-10-CM

## 2021-07-12 NOTE — Telephone Encounter (Signed)
Contacted [employer] and they have received images from [facility]. I have asked them to upload images to Epic.

## 2021-07-13 ENCOUNTER — Encounter: Payer: Self-pay | Admitting: Oncology

## 2021-07-14 ENCOUNTER — Telehealth: Payer: Self-pay

## 2021-07-14 ENCOUNTER — Encounter: Payer: Self-pay | Admitting: Oncology

## 2021-07-14 NOTE — Telephone Encounter (Signed)
Called pt and he states that he has appt at [facility] on Monday. He will go to appt and give [place] a call back to let us know how to proceed.

## 2021-07-14 NOTE — Telephone Encounter (Signed)
PET images now available in Epic.

## 2021-07-14 NOTE — Telephone Encounter (Signed)
-----   Message from Earlie Server, MD sent at 07/13/2021  9:41 PM EDT ----- Please ask if he would like me to proceed with next line of treatment/get authorization or wait for [employer] for second opinion

## 2021-07-15 ENCOUNTER — Ambulatory Visit: Payer: Medicare Other | Admitting: Internal Medicine

## 2021-07-25 ENCOUNTER — Telehealth: Payer: Self-pay | Admitting: Pharmacist

## 2021-07-25 ENCOUNTER — Other Ambulatory Visit (HOSPITAL_COMMUNITY): Payer: Self-pay

## 2021-07-25 ENCOUNTER — Inpatient Hospital Stay (HOSPITAL_BASED_OUTPATIENT_CLINIC_OR_DEPARTMENT_OTHER): Payer: Medicare Other | Admitting: Hospice and Palliative Medicine

## 2021-07-25 ENCOUNTER — Telehealth: Payer: Self-pay

## 2021-07-25 ENCOUNTER — Other Ambulatory Visit: Payer: Self-pay

## 2021-07-25 ENCOUNTER — Telehealth: Payer: Self-pay | Admitting: Pharmacy Technician

## 2021-07-25 ENCOUNTER — Telehealth: Payer: Self-pay | Admitting: *Deleted

## 2021-07-25 ENCOUNTER — Encounter: Payer: Self-pay | Admitting: Oncology

## 2021-07-25 VITALS — BP 103/81 | HR 106 | Temp 99.0°F | Resp 18

## 2021-07-25 DIAGNOSIS — Z5112 Encounter for antineoplastic immunotherapy: Secondary | ICD-10-CM | POA: Diagnosis not present

## 2021-07-25 DIAGNOSIS — R200 Anesthesia of skin: Secondary | ICD-10-CM | POA: Diagnosis not present

## 2021-07-25 DIAGNOSIS — C109 Malignant neoplasm of oropharynx, unspecified: Secondary | ICD-10-CM | POA: Diagnosis not present

## 2021-07-25 MED ORDER — HYDROCODONE-ACETAMINOPHEN 10-325 MG PO TABS: 1.0000 | ORAL_TABLET | Freq: Four times a day (QID) | ORAL | 0 refills | Status: AC | PRN

## 2021-07-25 NOTE — Progress Notes (Signed)
Symptom Management [place]  Telephone:([phone]) [phone] Fax:([phone]) [phone]  Patient Care Team: Albina Billet, MD as PCP - General (Internal Medicine) Albina Billet, MD (Internal Medicine) Bary Castilla Forest Gleason, MD (General Surgery) Noreene Filbert, MD as Referring Physician (Radiation Oncology) Earlie Server, MD as Consulting Physician (Oncology)   Name of the patient: Ivan Garcia  [id number]  04/02/1956   Date of visit: 07/25/21  Reason for Consult:  Ivan Ghee Asahd Can. is a 65 y.o. 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  Most recently on docetaxel.    Is status post PEG.  History of cardiopulmonary arrest in June 2022 after reaction to cetuximab.  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  He is status post EMG and is seeing Dr. Mickeal Skinner (on 07/08/2021) and neurology with Dr. Melrose Nakayama at [facility].  Additionally, patient saw an orthopedist - Dr. Junius Roads on 06/29/2021 and underwent steroid injection.  Symptoms were felt to be secondary to carpal tunnel versus neuropathy.    Patient presents to [facility] today for evaluation of worsening right hand/wrist pain and progressive weakness.  He says that he is now having difficulty providing for self-care as he is right arm dominant.  Patient has been treated on Lyrica but says that he discontinued this as he did not like the way it made him feel and did not help the pain.  He has been taking Norco twice daily and says that that has helped keep the pain bearable.  Patient denies neurological changes.  He has no neck pain with full range of motion of the neck.  No left-sided weakness.  No headaches or visual changes.  Denies any neurologic complaints. Denies recent fevers or illnesses. Denies any easy bleeding or bruising. Reports good appetite  and denies weight loss. Denies chest pain. Denies any nausea, vomiting, constipation, or diarrhea. Denies urinary complaints. Patient offers no further specific complaints today.    PAST MEDICAL HISTORY: Past Medical History:  Diagnosis Date   Arthritis    Cancer ([place])    Head and neck cancer   Hemorrhoids    Hyperlipidemia    Hypertension     PAST SURGICAL HISTORY:  Past Surgical History:  Procedure Laterality Date   COLONOSCOPY WITH PROPOFOL N/A 04/04/2017   Procedure: COLONOSCOPY WITH PROPOFOL;  Surgeon: Robert Bellow, MD;  Location: [facility] ENDOSCOPY;  Service: Endoscopy;  Laterality: N/A;   IR GASTROSTOMY TUBE MOD SED  09/24/2020   MANDIBLE SURGERY     PORT-A-CATH REMOVAL  07/17/2018   PORTA CATH INSERTION N/A 12/19/2017   Procedure: PORTA CATH INSERTION;  Surgeon: Algernon Huxley, MD;  Location: [place] CV LAB;  Service: Cardiovascular;  Laterality: N/A;   PORTA CATH INSERTION N/A 07/17/2018   Procedure: PORTA CATH INSERTION;  Surgeon: Algernon Huxley, MD;  Location: [place] CV LAB;  Service: Cardiovascular;  Laterality: N/A;   PORTA CATH INSERTION N/A 09/18/2019   Procedure: PORTA CATH INSERTION;  Surgeon: Algernon Huxley, MD;  Location: [place] CV LAB;  Service: Cardiovascular;  Laterality: N/A;   TONSILLECTOMY      HEMATOLOGY/ONCOLOGY HISTORY:  Oncology History  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[place])  12/14/2017 Initial Diagnosis   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[place])   09/17/2019 - 07/27/2020 Chemotherapy   The patient had [No matching medication found in this treatment plan]  for chemotherapy treatment.     09/29/2019 - 04/06/2020 Chemotherapy   The patient had palonosetron (ALOXI) injection 0.25 mg, 0.25 mg, Intravenous,  Once, 6 of 6 cycles Administration: 0.25 mg (09/29/2019), 0.25 mg (12/22/2019), 0.25 mg (10/20/2019), 0.25 mg (12/01/2019), 0.25 mg (11/10/2019), 0.25 mg (01/12/2020) pegfilgrastim (NEULASTA ONPRO KIT) injection 6 mg, 6 mg,  Subcutaneous, Once, 2 of 2 cycles Administration: 6 mg (12/26/2019), 6 mg (01/16/2020) pembrolizumab (KEYTRUDA) 200 mg in sodium chloride 0.9 % 50 mL chemo infusion, 200 mg (100 % of original dose 200 mg), Intravenous, Once, 10 of 10 cycles Dose modification: 200 mg (original dose 200 mg, Cycle 1, Reason: Provider Judgment) Administration: 200 mg (09/29/2019), 200 mg (10/20/2019), 200 mg (12/01/2019), 200 mg (12/22/2019), 200 mg (11/10/2019), 200 mg (01/12/2020), 200 mg (02/02/2020), 200 mg (02/23/2020), 200 mg (03/15/2020), 200 mg (04/06/2020) fosaprepitant (EMEND) 150 mg, dexamethasone (DECADRON) 12 mg in sodium chloride 0.9 % 145 mL IVPB, , Intravenous,  Once, 6 of 6 cycles Administration:  (09/29/2019),  (12/22/2019),  (10/20/2019),  (12/01/2019),  (11/10/2019),  (01/12/2020) fluorouracil (ADRUCIL) 7,900 mg in sodium chloride 0.9 % 92 mL chemo infusion, 1,000 mg/m2/day = 7,900 mg, Intravenous, 4D (96 hours ), 6 of 6 cycles Dose modification: 900 mg/m2/day (original dose 1,000 mg/m2/day, Cycle 5, Reason: Dose not tolerated), 850 mg/m2/day (original dose 1,000 mg/m2/day, Cycle 5, Reason: Dose not tolerated) Administration: 7,900 mg (09/29/2019), 6,750 mg (12/22/2019), 7,900 mg (10/20/2019), 6,750 mg (12/01/2019), 7,150 mg (11/10/2019), 6,750 mg (01/12/2020) CARBOplatin (PARAPLATIN) 500 mg in sodium chloride 0.9 % 250 mL chemo infusion, 500 mg (100 % of original dose 499 mg), Intravenous,  Once, 6 of 6 cycles Dose modification:   (original dose 499 mg, Cycle 1),   (original dose 440 mg, Cycle 5) Administration: 500 mg (09/29/2019), 440 mg (10/20/2019), 390 mg (12/01/2019), 440 mg (11/10/2019), 430 mg (01/12/2020)   for chemotherapy treatment.     04/28/2020 - 04/28/2020 Chemotherapy   The patient had cetuximab (ERBITUX) chemo infusion 800 mg, 400 mg/m2, Intravenous, Once, 0 of 8 cycles  for chemotherapy treatment.     05/14/2020 - 05/14/2020 Chemotherapy   The patient had pembrolizumab (KEYTRUDA) 200 mg in sodium  chloride 0.9 % 50 mL chemo infusion, 200 mg, Intravenous, Once, 1 of 4 cycles Administration: 200 mg (05/14/2020)   for chemotherapy treatment.     07/06/2020 -  Chemotherapy   The patient had palonosetron (ALOXI) injection 0.25 mg, 0.25 mg, Intravenous,  Once, 1 of 1 cycle Administration: 0.25 mg (09/07/2020) pegfilgrastim-jmdb (FULPHILA) injection 6 mg, 6 mg, Subcutaneous,  Once, 4 of 4 cycles Administration: 6 mg (07/08/2020), 6 mg (07/29/2020), 6 mg (08/19/2020), 6 mg (09/09/2020) CARBOplatin (PARAPLATIN) 430 mg in sodium chloride 0.9 % 250 mL chemo infusion, 460 mg (100 % of original dose 464.5 mg), Intravenous,  Once, 1 of 1 cycle Dose modification:   (original dose 464.5 mg, Cycle 4) Administration: 430 mg (09/07/2020) DOCEtaxel (TAXOTERE) 150 mg in sodium chloride 0.9 % 250 mL chemo infusion, 75 mg/m2 = 150 mg, Intravenous,  Once, 4 of 4 cycles Administration: 150 mg (07/06/2020), 150 mg (07/27/2020), 150 mg (08/17/2020), 150 mg (09/07/2020) fosaprepitant (EMEND) 150 mg in sodium chloride 0.9 % 145 mL IVPB, 150 mg, Intravenous,  Once, 1 of 1 cycle Administration: 150 mg (09/07/2020)   for chemotherapy treatment.       ALLERGIES:  is allergic to cetuximab.  MEDICATIONS:  Current Outpatient Medications  Medication Sig Dispense Refill   amLODipine (NORVASC) 5 MG tablet Take 5 mg by mouth  daily. (Patient not taking: No sig reported)     atorvastatin (LIPITOR) 20 MG tablet SMARTSIG:1 Tablet(s) By Mouth Every Evening (Patient not taking: No sig reported)     HYDROcodone-acetaminophen (NORCO) 10-325 MG tablet Take 1 tablet by mouth every 6 (six) hours as needed. 60 tablet 0   lidocaine-prilocaine (EMLA) cream Apply to affected area once 30 g 3   metoprolol succinate (TOPROL-XL) 25 MG 24 hr tablet Take 1 tablet by mouth daily. (Patient not taking: No sig reported)     nystatin (MYCOSTATIN) 100000 UNIT/ML suspension Take 5 mLs (500,000 Units total) by mouth in the morning, at noon, and at bedtime.  SWISH AND SPIT (Patient not taking: No sig reported) 473 mL 0   omeprazole (PRILOSEC) 20 MG capsule Take 1 capsule (20 mg total) by mouth daily. (Patient not taking: No sig reported) 90 capsule 1   ondansetron (ZOFRAN) 8 MG tablet Take 1 tablet (8 mg total) by mouth 2 (two) times daily as needed for refractory nausea / vomiting. Start on day 3 after carboplatin chemo. (Patient not taking: No sig reported) 30 tablet 1   predniSONE (DELTASONE) 50 MG tablet Take 1 tablet (50 mg total) by mouth daily with breakfast. 7 tablet 0   pregabalin (LYRICA) 150 MG capsule Take 1 capsule (150 mg total) by mouth 2 (two) times daily. 60 capsule 3   prochlorperazine (COMPAZINE) 10 MG tablet Take 1 tablet (10 mg total) by mouth every 6 (six) hours as needed (Nausea or vomiting). (Patient not taking: No sig reported) 30 tablet 1   senna-docusate (SENOKOT-S) 8.6-50 MG tablet Take 2 tablets by mouth daily. (Patient not taking: No sig reported) 60 tablet 3   No current facility-administered medications for this visit.   Facility-Administered Medications Ordered in Other Visits  Medication Dose Route Frequency Provider Last Rate Last Admin   sodium chloride flush (NS) 0.9 % injection 10 mL  10 mL Intravenous PRN Earlie Server, MD       sodium chloride flush (NS) 0.9 % injection 10 mL  10 mL Intravenous PRN Earlie Server, MD   10 mL at 10/13/20 1034    VITAL SIGNS: BP 103/81   Pulse (!) 106   Temp 99 F (37.2 C) (Tympanic)   Resp 18   SpO2 99%  There were no vitals filed for this visit.  Estimated body mass index is 28.35 kg/m as calculated from the following:   Height as of 05/05/21: '5\' 9"'  (1.753 m).   Weight as of 07/08/21: 192 lb (87.1 kg).  LABS: CBC:    Component Value Date/Time   WBC 9.0 07/08/2021 1025   HGB 13.7 07/08/2021 1025   HCT 41.4 07/08/2021 1025   PLT 306 07/08/2021 1025   MCV 95.4 07/08/2021 1025   NEUTROABS 7.8 (H) 07/08/2021 1025   LYMPHSABS 0.3 (L) 07/08/2021 1025   MONOABS 0.6 07/08/2021 1025    EOSABS 0.1 07/08/2021 1025   BASOSABS 0.1 07/08/2021 1025   Comprehensive Metabolic Panel:    Component Value Date/Time   NA 134 (L) 07/08/2021 1025   K 4.4 07/08/2021 1025   CL 97 (L) 07/08/2021 1025   CO2 29 07/08/2021 1025   BUN 28 (H) 07/08/2021 1025   CREATININE 1.14 07/08/2021 1025   GLUCOSE 131 (H) 07/08/2021 1025   CALCIUM 9.1 07/08/2021 1025   AST 27 07/08/2021 1025   ALT 18 07/08/2021 1025   ALKPHOS 73 07/08/2021 1025   BILITOT 0.6 07/08/2021 1025   PROT 6.6 07/08/2021 1025  ALBUMIN 3.7 07/08/2021 1025    RADIOGRAPHIC STUDIES: No results found.  PERFORMANCE STATUS (ECOG) : 1 - Symptomatic but completely ambulatory  Review of Systems Unless otherwise noted, a complete review of systems is negative.  Physical Exam General: NAD Cardiovascular: regular rate and rhythm Pulmonary: clear anterior/posterior fields Abdomen: soft, nontender, + bowel sounds, PEG noted GU: no suprapubic tenderness Extremities: no edema, no joint deformities Skin: no rashes Neurological: Right hand weakness but otherwise grossly nonfocal  Assessment and Plan- Patient is a 65 y.o. 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  He says that he has follow-up on Wednesday with Dr. Lannie Fields office but does not want to wait until then to initiate a referral to a surgeon.  He says that Dr. Melrose Nakayama has told him that he has carpal tunnel and he would like to see a specialist to discuss surgical options.  We will proceed with referral to Ortho.  Patient does not want to return to [facility] and has instead requested [facility].  I did discuss the option of imaging the patient declined this.  Last MRI of the cervical  spine was in June. Patient has been using the brace at night does not find it helpful.  He discontinued the Lyrica.  Patient has been taking the Norco, which has helped.  He is only been using the Norco twice daily and we discussed increasing the frequency to 3 or 4 times daily.  We will send a refill for Norco today.     Patient expressed understanding and was in agreement with this plan. He also understands that He can call clinic at any time with any questions, concerns, or complaints.   Thank you for allowing me to participate in the care of this very pleasant patient.   Time Total: 20 minutes  Visit consisted of counseling and education dealing with the complex and emotionally intense issues of symptom management and palliative care in the setting of serious and potentially life-threatening illness.Greater than 50%  of this time was spent counseling and coordinating care related to the above assessment and plan.  Signed by: Altha Harm, PhD, NP-C

## 2021-07-25 NOTE — Telephone Encounter (Signed)
Oral Oncology Patient Advocate Encounter   Received notification from [facility] that prior authorization for Lenvima is required.   PA submitted on CoverMyMeds Key [id number] Status is pending   Oral Oncology Clinic will continue to follow.  [place] Patient [place] Phone [phone] Fax [phone] 07/28/2021 9:57 AM

## 2021-07-25 NOTE — Progress Notes (Signed)
Increasing tingling sensation and decreased function in right arm. Pt had EMG done recently at [facility]. Pt was told that he had carpal tunel. He had a cortisone injection at orthopedics, but stated that it did not help. He will f/u with neurology this Wednesday at [facility] neurology and this Friday with Dr. Mickeal Skinner.

## 2021-07-25 NOTE — Telephone Encounter (Signed)
Patient called to report that he is rapidly losing function of both hands. He has an appointment with neuro next week but is now unable to feed himself. Writer spoke with Dr. Tasia Catchings she would like patient to be seen in [facility]. Patient states that he can make an afternoon appointment.

## 2021-07-25 NOTE — Telephone Encounter (Signed)
Called patient and scheduled him to be seen in [facility] this afternoon. Pt agreed to 1:45pm appointment time.

## 2021-07-25 NOTE — Telephone Encounter (Signed)
Oral Oncology Pharmacist Encounter  Received new prescription for Lenvima (lenvatinib) for the treatment of metastatic squamous cell carcinoma of oropharynx in conjunction with pembrolizumab, planned duration until disease progression or unacceptable drug toxicity.  CMP from 07/08/21 assessed, no relevant lab abnormalities. BP from recent office visit well controlled. Prescription dose and frequency assessed.   Current medication list in Epic reviewed, no relevant DDIs with lenvatinib identified.  Evaluated chart and no patient barriers to medication adherence identified.   Prescription has been e-scribed to the [facility] for benefits analysis and approval.  Oral Oncology Clinic will continue to follow for insurance authorization, copayment issues, initial counseling and start date.   Darl Pikes, PharmD, BCPS, BCOP, CPP Hematology/Oncology Clinical Pharmacist Practitioner [facility] [phone]  07/25/2021 12:03 PM

## 2021-07-25 NOTE — Telephone Encounter (Signed)
New orthopedic referral/ hand specialist faxed to [facility]. Fax transmission confirmation received.

## 2021-07-27 ENCOUNTER — Other Ambulatory Visit: Payer: Self-pay | Admitting: Oncology

## 2021-07-27 DIAGNOSIS — C109 Malignant neoplasm of oropharynx, unspecified: Secondary | ICD-10-CM

## 2021-07-27 MED ORDER — LENVIMA (20 MG DAILY DOSE) 2 X 10 MG PO CPPK
20.0000 mg | ORAL_CAPSULE | Freq: Every day | ORAL | 0 refills | Status: DC
  Filled 2021-07-27: qty 60, 30d supply, fill #0

## 2021-07-27 NOTE — Progress Notes (Signed)
DISCONTINUE OFF PATHWAY REGIMEN - Head and Neck   [id number]:Docetaxel 75 mg/m2:   A cycle is every 21 days:     Docetaxel   **Always confirm dose/schedule in your pharmacy ordering system**  REASON: Disease Progression PRIOR TREATMENT: Off Pathway: Docetaxel 75 mg/m2 TREATMENT RESPONSE: Progressive Disease (PD)  START OFF PATHWAY REGIMEN - Head and Neck   [id number]:Pembrolizumab 200 mg IV D1 q21 Days:   A cycle is every 21 days:     Pembrolizumab   **Always confirm dose/schedule in your pharmacy ordering system**  Patient Characteristics: Oropharynx, HPV Positive, Metastatic, Fourth Line and Beyond Disease Classification: Oropharynx HPV Status: Positive (+) Therapeutic Status: Metastatic Disease Line of Therapy: Fourth Line and Beyond  Intent of Therapy: Non-Curative / Palliative Intent, Discussed with Patient

## 2021-07-28 ENCOUNTER — Other Ambulatory Visit (HOSPITAL_COMMUNITY): Payer: Self-pay

## 2021-07-28 ENCOUNTER — Encounter: Payer: Self-pay | Admitting: Oncology

## 2021-07-28 NOTE — Telephone Encounter (Signed)
Oral Oncology Patient Advocate Encounter  Received notification from [facility] that the request for prior authorization for Lenvima has been denied due to drug criteria not being met.     This determination will be appealed.    This encounter will continue to be updated until final determination.    [place] Patient [place] Phone [phone] Fax [phone] 07/28/2021 9:59 AM

## 2021-07-29 ENCOUNTER — Inpatient Hospital Stay (HOSPITAL_BASED_OUTPATIENT_CLINIC_OR_DEPARTMENT_OTHER): Payer: Medicare Other | Admitting: Internal Medicine

## 2021-07-29 DIAGNOSIS — Z5112 Encounter for antineoplastic immunotherapy: Secondary | ICD-10-CM | POA: Diagnosis not present

## 2021-07-29 DIAGNOSIS — G5601 Carpal tunnel syndrome, right upper limb: Secondary | ICD-10-CM | POA: Insufficient documentation

## 2021-07-29 NOTE — Progress Notes (Signed)
Pt states that he has been feeling very tired and weak and his BP is normally low and has been that way but he feels as if he has no energy, has been going on for about two-three months.

## 2021-07-29 NOTE — Progress Notes (Signed)
[facility] [place], [place] [address] [phone]   Interval Evaluation  Date of Service: 07/29/21 Patient Name: Ivan Garcia [facility]. Patient MRN: [id number] Patient DOB: 1956-01-08 Provider: Ventura Sellers, MD  Identifying Statement:  [name]. is a 65 y.o. 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     09/29/2019 - 04/06/2020 Chemotherapy   The patient had palonosetron (ALOXI) injection 0.25 mg, 0.25 mg, Intravenous,  Once, 6 of 6 cycles Administration: 0.25 mg (09/29/2019), 0.25 mg (12/22/2019), 0.25 mg (10/20/2019), 0.25 mg (12/01/2019), 0.25 mg (11/10/2019), 0.25 mg (01/12/2020) pegfilgrastim (NEULASTA ONPRO KIT) injection 6 mg, 6 mg, Subcutaneous, Once, 2 of 2 cycles Administration: 6 mg (12/26/2019), 6 mg (01/16/2020) pembrolizumab (KEYTRUDA) 200 mg in sodium chloride 0.9 % 50 mL chemo infusion, 200 mg (100 % of original dose 200 mg), Intravenous, Once, 10 of 10 cycles Dose modification: 200 mg (original dose 200 mg, Cycle 1, Reason: Provider Judgment) Administration: 200 mg (09/29/2019), 200 mg (10/20/2019), 200 mg (12/01/2019), 200 mg (12/22/2019), 200 mg (11/10/2019), 200 mg (01/12/2020), 200 mg (02/02/2020), 200 mg (02/23/2020), 200 mg (03/15/2020), 200 mg (04/06/2020) fosaprepitant (EMEND) 150 mg, dexamethasone (DECADRON) 12 mg in sodium chloride 0.9 % 145 mL IVPB, , Intravenous,  Once, 6 of 6 cycles Administration:  (09/29/2019),  (12/22/2019),  (10/20/2019),  (12/01/2019),  (11/10/2019),  (01/12/2020) fluorouracil (ADRUCIL) 7,900 mg in sodium chloride 0.9 % 92 mL chemo infusion, 1,000 mg/m2/day = 7,900 mg,  Intravenous, 4D (96 hours ), 6 of 6 cycles Dose modification: 900 mg/m2/day (original dose 1,000 mg/m2/day, Cycle 5, Reason: Dose not tolerated), 850 mg/m2/day (original dose 1,000 mg/m2/day, Cycle 5, Reason: Dose not tolerated) Administration: 7,900 mg (09/29/2019), 6,750 mg (12/22/2019), 7,900 mg (10/20/2019), 6,750 mg (12/01/2019), 7,150 mg (11/10/2019), 6,750 mg (01/12/2020) CARBOplatin (PARAPLATIN) 500 mg in sodium chloride 0.9 % 250 mL chemo infusion, 500 mg (100 % of original dose 499 mg), Intravenous,  Once, 6 of 6 cycles Dose modification:   (original dose 499 mg, Cycle 1),   (original dose 440 mg, Cycle 5) Administration: 500 mg (09/29/2019), 440 mg (10/20/2019), 390 mg (12/01/2019), 440 mg (11/10/2019), 430 mg (01/12/2020)   for chemotherapy treatment.     04/28/2020 - 04/28/2020 Chemotherapy   The patient had cetuximab (ERBITUX) chemo infusion 800 mg, 400 mg/m2, Intravenous, Once, 0 of 8 cycles  for chemotherapy treatment.     05/14/2020 - 05/14/2020 Chemotherapy   The patient had pembrolizumab (KEYTRUDA) 200 mg in sodium chloride 0.9 % 50 mL chemo infusion, 200 mg, Intravenous, Once, 1 of 4 cycles Administration: 200 mg (05/14/2020)   for chemotherapy treatment.     07/06/2020 - 09/09/2020 Chemotherapy          07/28/2021 -  Chemotherapy    Patient is on Treatment Plan: PEMBROLIZUMAB Q21D         Interval History: Ivan Garcia. presents today for follow up after completing EMG study.  He describes no improvement of symptoms with Lyrica and has discontinued it.  Also had no benefit from nocturnal splinting, which he tried for 1 month. Otherwise still continues to complains of pain, numbness and weakness in the right  fingers and palm.  Symptoms continue to worsen gradually over time.  H+P (06/17/21) Patient describes a 2 months history of right hand and arm pain, numbness.  There is also component of right hand weakness.  Pain and numbness are described as affecting "all  fingertips" and also the palm.  The forearm he describes as "some numbness, but not painful".  For pain, he has been taking gabapentin 23m TID which has been ineffective.  The 3023mdose caused sedation. The right hand has been much weaker lately, he is unable to hold a pick and strum carefully on his guitar.  His ability to button his shirt and tie shoes has been compromised.  These issues all seem to be worsening in recent weeks, despite no significant (neuropathically relevant) chemotherapy exposure since last fall.  He has had multiple cancer related issues in recent weeks, including dysphagia requiring PEG, recent cardiac arrest.  He is pending [facility] Medications: Current Outpatient Medications on File Prior to Visit  Medication Sig Dispense Refill   gabapentin (NEURONTIN) 100 MG capsule TAKE 2 CAPSULES BY MOUTH 3 TIMES DAILY.     HYDROcodone-acetaminophen (NORCO) 10-325 MG tablet Take 1 tablet by mouth every 6 (six) hours as needed. 60 tablet 0   lidocaine-prilocaine (EMLA) cream Apply to affected area once 30 g 3   oxyCODONE (ROXICODONE) 5 MG/5ML solution PLACE 5-10 MLS (5-10 MG TOTAL) INTO FEEDING TUBE EVERY 4 (FOUR) HOURS AS NEEDED FOR SEVERE PAIN.     acetaminophen (TYLENOL) 160 MG/5ML solution Take by mouth. (Patient not taking: Reported on 07/29/2021)     amLODipine (NORVASC) 5 MG tablet Take 5 mg by mouth daily. (Patient not taking: No sig reported)     atorvastatin (LIPITOR) 20 MG tablet SMARTSIG:1 Tablet(s) By Mouth Every Evening (Patient not taking: No sig reported)     cetirizine (ZYRTEC) 10 MG tablet Take by mouth. (Patient not taking: Reported on 07/29/2021)     famotidine (PEPCID) 20 MG tablet Take by mouth. (Patient not taking: Reported on 07/29/2021)     ibuprofen (ADVIL) 100 MG/5ML suspension Take by mouth. (Patient not taking: Reported on 07/29/2021)     lenvatinib 20 mg daily dose (LENVIMA, 20 MG DAILY DOSE,) 2 x 10 MG capsule Take 2 capsules  (20 mg total) by mouth daily. (Patient not taking: Reported on 07/29/2021) 60 capsule 0   metoprolol succinate (TOPROL-XL) 25 MG 24 hr tablet Take 1 tablet by mouth daily. (Patient not taking: No sig reported)     nystatin (MYCOSTATIN) 100000 UNIT/ML suspension Take 5 mLs (500,000 Units total) by mouth in the morning, at noon, and at bedtime. SWISH AND SPIT (Patient not taking: No sig reported) 473 mL 0   omeprazole (PRILOSEC) 20 MG capsule Take 1 capsule (20 mg total) by mouth daily. (Patient not taking: No sig reported) 90 capsule 1   ondansetron (ZOFRAN) 8 MG tablet Take 1 tablet (8 mg total) by mouth 2 (two) times daily as needed for refractory nausea / vomiting. Start on day 3 after carboplatin chemo. (Patient not taking: No sig reported) 30 tablet 1   predniSONE (DELTASONE) 50 MG tablet Take 1 tablet (50 mg total) by mouth daily with breakfast. 7 tablet 0   pregabalin (LYRICA) 150 MG capsule Take 1 capsule (150 mg total) by mouth 2 (two) times daily. 60 capsule 3   prochlorperazine (COMPAZINE) 10 MG tablet Take 1 tablet (10 mg total) by mouth every 6 (six) hours as needed (Nausea  or vomiting). (Patient not taking: No sig reported) 30 tablet 1   senna-docusate (SENOKOT-S) 8.6-50 MG tablet Take 2 tablets by mouth daily. (Patient not taking: No sig reported) 60 tablet 3   Current Facility-Administered Medications on File Prior to Visit  Medication Dose Route Frequency Provider Last Rate Last Admin   sodium chloride flush (NS) 0.9 % injection 10 mL  10 mL Intravenous PRN Earlie Server, MD       sodium chloride flush (NS) 0.9 % injection 10 mL  10 mL Intravenous PRN Earlie Server, MD   10 mL at 10/13/20 1034    Allergies:  Allergies  Allergen Reactions   Cetuximab Anaphylaxis   Past Medical History:  Past Medical History:  Diagnosis Date   Arthritis    Cancer ([place])    Head and neck cancer   Hemorrhoids    Hyperlipidemia    Hypertension    Past Surgical History:  Past Surgical History:   Procedure Laterality Date   COLONOSCOPY WITH PROPOFOL N/A 04/04/2017   Procedure: COLONOSCOPY WITH PROPOFOL;  Surgeon: Robert Bellow, MD;  Location: [facility] ENDOSCOPY;  Service: Endoscopy;  Laterality: N/A;   IR GASTROSTOMY TUBE MOD SED  09/24/2020   MANDIBLE SURGERY     PORT-A-CATH REMOVAL  07/17/2018   PORTA CATH INSERTION N/A 12/19/2017   Procedure: PORTA CATH INSERTION;  Surgeon: Algernon Huxley, MD;  Location: [facility];  Service: Cardiovascular;  Laterality: N/A;   PORTA CATH INSERTION N/A 07/17/2018   Procedure: PORTA CATH INSERTION;  Surgeon: Algernon Huxley, MD;  Location: [facility];  Service: Cardiovascular;  Laterality: N/A;   PORTA CATH INSERTION N/A 09/18/2019   Procedure: PORTA CATH INSERTION;  Surgeon: Algernon Huxley, MD;  Location: [facility];  Service: Cardiovascular;  Laterality: N/A;   TONSILLECTOMY     Social History:  Social History   Socioeconomic History   Marital status: Single    Spouse name: Not on file   Number of children: Not on file   Years of education: Not on file   Highest education level: Not on file  Occupational History   Not on file  Tobacco Use   Smoking status: Never   Smokeless tobacco: Never  Vaping Use   Vaping Use: Never used  Substance and Sexual Activity   Alcohol use: No   Drug use: No   Sexual activity: Not on file  Other Topics Concern   Not on file  Social History Narrative   Not on file   Social Determinants of Health   Financial Resource Strain: Not on file  Food Insecurity: Not on file  Transportation Needs: Not on file  Physical Activity: Not on file  Stress: Not on file  Social Connections: Not on file  Intimate Partner Violence: Not on file   Family History:  Family History  Problem Relation Age of Onset   Breast cancer Mother    Asthma Mother    Congestive Heart Failure Mother    Prostate cancer Father    Brain cancer Father    Bladder Cancer Father     Review of  Systems: Constitutional: Doesn't report fevers, chills or abnormal weight loss Eyes: Doesn't report blurriness of vision Ears, nose, mouth, throat, and face: Doesn't report sore throat Respiratory: Doesn't report cough, dyspnea or wheezes Cardiovascular: Doesn't report palpitation, chest discomfort  Gastrointestinal:  Doesn't report nausea, constipation, diarrhea GU: Doesn't report incontinence Skin: Doesn't report skin rashes Neurological: Per HPI Musculoskeletal: Doesn't  report joint pain Behavioral/Psych: Doesn't report anxiety  Physical Exam: Vitals:   07/29/21 1025  BP: 103/74  Pulse: (!) 109  Resp: 18  Temp: (!) 96.8 F (36 C)  SpO2: 95%   KPS: 70. General: Alert, cooperative, pleasant, in no acute distress Head: Normal EENT: No conjunctival injection or scleral icterus.  Lungs: Resp effort normal Cardiac: Regular rate Abdomen:PEG in place Skin: No rashes cyanosis or petechiae. Extremities: No clubbing or edema  Neurologic Exam: Mental Status: Awake, alert, attentive to examiner. Oriented to self and environment. Language is fluent with intact comprehension.  Cranial Nerves: Visual acuity is grossly normal. Visual fields are full. Extra-ocular movements intact. No ptosis. Face is symmetric Motor: Tone and bulk are normal. Noted weakness in R thumb apposition. Reflexes are symmetric, no pathologic reflexes present.  Sensory: Patchy sensory impairment right hand Gait: Normal.   Labs: I have reviewed the data as listed    Component Value Date/Time   NA 134 (L) 07/08/2021 1025   K 4.4 07/08/2021 1025   CL 97 (L) 07/08/2021 1025   CO2 29 07/08/2021 1025   GLUCOSE 131 (H) 07/08/2021 1025   BUN 28 (H) 07/08/2021 1025   CREATININE 1.14 07/08/2021 1025   CALCIUM 9.1 07/08/2021 1025   PROT 6.6 07/08/2021 1025   ALBUMIN 3.7 07/08/2021 1025   AST 27 07/08/2021 1025   ALT 18 07/08/2021 1025   ALKPHOS 73 07/08/2021 1025   BILITOT 0.6 07/08/2021 1025   GFRNONAA >60  07/08/2021 1025   GFRAA 50 (L) 08/31/2020 1244   Lab Results  Component Value Date   WBC 9.0 07/08/2021   NEUTROABS 7.8 (H) 07/08/2021   HGB 13.7 07/08/2021   HCT 41.4 07/08/2021   MCV 95.4 07/08/2021   PLT 306 07/08/2021    Assessment/Plan Carpal tunnel syndrome of right wrist  [name]. 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  He has failed nocturnal splinting, oral corticosteroids, and localized steroid injection.  Referral has been placed for evaluation by ortho/hand surgical team at [facility].   We appreciate the opportunity to participate in the care of [employer]Marland Kitchen He will con't to follow with his primary neurologist and orthopedics, he may follow up with [place] only as needed.  All questions were answered. The patient knows to call the clinic with any problems, questions or concerns. No barriers to learning were detected.  The total time spent in the encounter was 30 minutes and more than 50% was on counseling and review of test results   Ventura Sellers, MD Medical Director of Neuro-Oncology [facility] 07/29/21 11:38 AM

## 2021-08-01 ENCOUNTER — Inpatient Hospital Stay: Payer: Medicare Other | Admitting: Pharmacist

## 2021-08-01 ENCOUNTER — Other Ambulatory Visit: Payer: Self-pay

## 2021-08-01 ENCOUNTER — Other Ambulatory Visit (HOSPITAL_COMMUNITY): Payer: Self-pay

## 2021-08-01 ENCOUNTER — Inpatient Hospital Stay: Payer: Medicare Other

## 2021-08-01 ENCOUNTER — Inpatient Hospital Stay (HOSPITAL_BASED_OUTPATIENT_CLINIC_OR_DEPARTMENT_OTHER): Payer: Medicare Other | Admitting: Oncology

## 2021-08-01 ENCOUNTER — Encounter: Payer: Self-pay | Admitting: Oncology

## 2021-08-01 ENCOUNTER — Telehealth: Payer: Self-pay | Admitting: Pharmacy Technician

## 2021-08-01 VITALS — BP 117/89 | HR 103 | Temp 96.2°F | Resp 18 | Wt 189.2 lb

## 2021-08-01 DIAGNOSIS — C109 Malignant neoplasm of oropharynx, unspecified: Secondary | ICD-10-CM

## 2021-08-01 DIAGNOSIS — Z5112 Encounter for antineoplastic immunotherapy: Secondary | ICD-10-CM | POA: Diagnosis not present

## 2021-08-01 DIAGNOSIS — G620 Drug-induced polyneuropathy: Secondary | ICD-10-CM | POA: Diagnosis not present

## 2021-08-01 DIAGNOSIS — N1831 Chronic kidney disease, stage 3a: Secondary | ICD-10-CM

## 2021-08-01 DIAGNOSIS — Z95828 Presence of other vascular implants and grafts: Secondary | ICD-10-CM

## 2021-08-01 DIAGNOSIS — T451X5A Adverse effect of antineoplastic and immunosuppressive drugs, initial encounter: Secondary | ICD-10-CM

## 2021-08-01 DIAGNOSIS — G5601 Carpal tunnel syndrome, right upper limb: Secondary | ICD-10-CM | POA: Diagnosis not present

## 2021-08-01 DIAGNOSIS — Z5111 Encounter for antineoplastic chemotherapy: Secondary | ICD-10-CM

## 2021-08-01 DIAGNOSIS — R1310 Dysphagia, unspecified: Secondary | ICD-10-CM

## 2021-08-01 LAB — COMPREHENSIVE METABOLIC PANEL
ALT: 17 U/L (ref 0–44)
AST: 21 U/L (ref 15–41)
Albumin: 3.7 g/dL (ref 3.5–5.0)
Alkaline Phosphatase: 68 U/L (ref 38–126)
Anion gap: 8 (ref 5–15)
BUN: 23 mg/dL (ref 8–23)
CO2: 28 mmol/L (ref 22–32)
Calcium: 8.8 mg/dL — ABNORMAL LOW (ref 8.9–10.3)
Chloride: 94 mmol/L — ABNORMAL LOW (ref 98–111)
Creatinine, Ser: 1.06 mg/dL (ref 0.61–1.24)
GFR, Estimated: >60 mL/min (ref >60)
Glucose, Bld: 118 mg/dL — ABNORMAL HIGH (ref 70–99)
Potassium: 4.1 mmol/L (ref 3.5–5.1)
Sodium: 130 mmol/L — ABNORMAL LOW (ref 135–145)
Total Bilirubin: 0.6 mg/dL (ref 0.3–1.2)
Total Protein: 6.3 g/dL — ABNORMAL LOW (ref 6.5–8.1)

## 2021-08-01 LAB — CBC WITH DIFFERENTIAL/PLATELET
Abs Immature Granulocytes: 0.03 10*3/uL (ref 0.00–0.07)
Basophils Absolute: 0.1 10*3/uL (ref 0.0–0.1)
Basophils Relative: 1 %
Eosinophils Absolute: 0.1 10*3/uL (ref 0.0–0.5)
Eosinophils Relative: 2 %
HCT: 39.5 % (ref 39.0–52.0)
Hemoglobin: 13.2 g/dL (ref 13.0–17.0)
Immature Granulocytes: 0 %
Lymphocytes Relative: 4 %
Lymphs Abs: 0.3 10*3/uL — ABNORMAL LOW (ref 0.7–4.0)
MCH: 31.3 pg (ref 26.0–34.0)
MCHC: 33.4 g/dL (ref 30.0–36.0)
MCV: 93.6 fL (ref 80.0–100.0)
Monocytes Absolute: 0.5 10*3/uL (ref 0.1–1.0)
Monocytes Relative: 7 %
Neutro Abs: 6.3 10*3/uL (ref 1.7–7.7)
Neutrophils Relative %: 86 %
Platelets: 316 10*3/uL (ref 150–400)
RBC: 4.22 MIL/uL (ref 4.22–5.81)
RDW: 13.5 % (ref 11.5–15.5)
WBC: 7.3 10*3/uL (ref 4.0–10.5)
nRBC: 0 % (ref 0.0–0.2)

## 2021-08-01 LAB — TSH: TSH: 94 u[IU]/mL — ABNORMAL HIGH (ref 0.350–4.500)

## 2021-08-01 MED ORDER — HEPARIN SOD (PORK) LOCK FLUSH 100 UNIT/ML IV SOLN
500.0000 [IU] | Freq: Once | INTRAVENOUS | Status: AC | PRN
  Administered 2021-08-01: 500 [IU]
  Filled 2021-08-01: qty 5

## 2021-08-01 MED ORDER — HEPARIN SOD (PORK) LOCK FLUSH 100 UNIT/ML IV SOLN
INTRAVENOUS | Status: AC
  Filled 2021-08-01: qty 5

## 2021-08-01 MED ORDER — HEPARIN SOD (PORK) LOCK FLUSH 100 UNIT/ML IV SOLN
500.0000 [IU] | Freq: Once | INTRAVENOUS | Status: DC
  Filled 2021-08-01: qty 5

## 2021-08-01 MED ORDER — SODIUM CHLORIDE 0.9 % IV SOLN
Freq: Once | INTRAVENOUS | Status: AC
  Filled 2021-08-01: qty 250

## 2021-08-01 MED ORDER — SODIUM CHLORIDE 0.9 % IV SOLN
200.0000 mg | Freq: Once | INTRAVENOUS | Status: AC
  Administered 2021-08-01: 200 mg via INTRAVENOUS
  Filled 2021-08-01: qty 8

## 2021-08-01 MED ORDER — SODIUM CHLORIDE 0.9% FLUSH
10.0000 mL | Freq: Once | INTRAVENOUS | Status: AC
  Administered 2021-08-01: 10 mL via INTRAVENOUS
  Filled 2021-08-01: qty 10

## 2021-08-01 NOTE — Progress Notes (Signed)
[facility]  [phone] [phone]  Patient Care Team: Albina Billet, MD as PCP - General (Internal Medicine) Albina Billet, MD (Internal Medicine) Bary Castilla Forest Gleason, MD (General Surgery) Noreene Filbert, MD as Referring Physician (Radiation Oncology) Earlie Server, MD as Consulting Physician (Oncology)   Name of the patient: Ivan Garcia  [id number]  11-11-1956   Date of visit: 08/01/21  HPI: Patient is a 65 y.o. male with metastatic squamous cell carcinoma of oropharynx starting on Lenvima (lenvatinib) in conjunction with pembrolizumab, with a history of progression on multiple previous lines of therapy. He will start lenvatinib and pembrolizumab today 08/01/21.  Reason for Consult: Lenvima (lenvatinib) oral chemotherapy education.   PAST MEDICAL HISTORY: Past Medical History:  Diagnosis Date   Arthritis    Cancer ([place])    Head and neck cancer   Hemorrhoids    Hyperlipidemia    Hypertension     HEMATOLOGY/ONCOLOGY HISTORY:  Oncology History  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[place])  12/14/2017 Initial Diagnosis   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[place])   09/17/2019 - 07/27/2020 Chemotherapy   The patient had [No matching medication found in this treatment plan]  for chemotherapy treatment.     09/29/2019 - 04/06/2020 Chemotherapy   The patient had palonosetron (ALOXI) injection 0.25 mg, 0.25 mg, Intravenous,  Once, 6 of 6 cycles Administration: 0.25 mg (09/29/2019), 0.25 mg (12/22/2019), 0.25 mg (10/20/2019), 0.25 mg (12/01/2019), 0.25 mg (11/10/2019), 0.25 mg (01/12/2020) pegfilgrastim (NEULASTA ONPRO KIT) injection 6 mg, 6 mg, Subcutaneous, Once, 2 of 2 cycles Administration: 6 mg (12/26/2019), 6 mg (01/16/2020) pembrolizumab (KEYTRUDA) 200 mg in sodium chloride 0.9 % 50 mL chemo infusion, 200 mg (100 % of original dose 200 mg), Intravenous, Once, 10 of 10 cycles Dose modification: 200 mg (original  dose 200 mg, Cycle 1, Reason: Provider Judgment) Administration: 200 mg (09/29/2019), 200 mg (10/20/2019), 200 mg (12/01/2019), 200 mg (12/22/2019), 200 mg (11/10/2019), 200 mg (01/12/2020), 200 mg (02/02/2020), 200 mg (02/23/2020), 200 mg (03/15/2020), 200 mg (04/06/2020) fosaprepitant (EMEND) 150 mg, dexamethasone (DECADRON) 12 mg in sodium chloride 0.9 % 145 mL IVPB, , Intravenous,  Once, 6 of 6 cycles Administration:  (09/29/2019),  (12/22/2019),  (10/20/2019),  (12/01/2019),  (11/10/2019),  (01/12/2020) fluorouracil (ADRUCIL) 7,900 mg in sodium chloride 0.9 % 92 mL chemo infusion, 1,000 mg/m2/day = 7,900 mg, Intravenous, 4D (96 hours ), 6 of 6 cycles Dose modification: 900 mg/m2/day (original dose 1,000 mg/m2/day, Cycle 5, Reason: Dose not tolerated), 850 mg/m2/day (original dose 1,000 mg/m2/day, Cycle 5, Reason: Dose not tolerated) Administration: 7,900 mg (09/29/2019), 6,750 mg (12/22/2019), 7,900 mg (10/20/2019), 6,750 mg (12/01/2019), 7,150 mg (11/10/2019), 6,750 mg (01/12/2020) CARBOplatin (PARAPLATIN) 500 mg in sodium chloride 0.9 % 250 mL chemo infusion, 500 mg (100 % of original dose 499 mg), Intravenous,  Once, 6 of 6 cycles Dose modification:   (original dose 499 mg, Cycle 1),   (original dose 440 mg, Cycle 5) Administration: 500 mg (09/29/2019), 440 mg (10/20/2019), 390 mg (12/01/2019), 440 mg (11/10/2019), 430 mg (01/12/2020)   for chemotherapy treatment.     04/28/2020 - 04/28/2020 Chemotherapy   The patient had cetuximab (ERBITUX) chemo infusion 800 mg, 400 mg/m2, Intravenous, Once, 0 of 8 cycles  for chemotherapy treatment.     05/14/2020 - 05/14/2020 Chemotherapy   The patient had pembrolizumab (KEYTRUDA) 200 mg in sodium chloride 0.9 % 50 mL chemo infusion, 200 mg, Intravenous, Once, 1 of 4 cycles Administration: 200 mg (05/14/2020)  for chemotherapy treatment.     07/06/2020 - 09/09/2020 Chemotherapy          07/28/2021 -  Chemotherapy    Patient is on Treatment Plan: PEMBROLIZUMAB  Q21D         ALLERGIES:  is allergic to cetuximab.  MEDICATIONS:  Current Outpatient Medications  Medication Sig Dispense Refill   acetaminophen (TYLENOL) 160 MG/5ML solution Take by mouth. (Patient not taking: No sig reported)     amLODipine (NORVASC) 5 MG tablet Take 5 mg by mouth daily. (Patient not taking: No sig reported)     atorvastatin (LIPITOR) 20 MG tablet SMARTSIG:1 Tablet(s) By Mouth Every Evening (Patient not taking: No sig reported)     cetirizine (ZYRTEC) 10 MG tablet Take by mouth. (Patient not taking: No sig reported)     famotidine (PEPCID) 20 MG tablet Take by mouth. (Patient not taking: No sig reported)     gabapentin (NEURONTIN) 100 MG capsule TAKE 2 CAPSULES BY MOUTH 3 TIMES DAILY. (Patient not taking: Reported on 08/01/2021)     HYDROcodone-acetaminophen (NORCO) 10-325 MG tablet Take 1 tablet by mouth every 6 (six) hours as needed. 60 tablet 0   ibuprofen (ADVIL) 100 MG/5ML suspension Take by mouth. (Patient not taking: No sig reported)     lenvatinib 20 mg daily dose (LENVIMA, 20 MG DAILY DOSE,) 2 x 10 MG capsule Take 2 capsules (20 mg total) by mouth daily. (Patient not taking: No sig reported) 60 capsule 0   lidocaine-prilocaine (EMLA) cream Apply to affected area once (Patient not taking: Reported on 08/01/2021) 30 g 3   metoprolol succinate (TOPROL-XL) 25 MG 24 hr tablet Take 1 tablet by mouth daily. (Patient not taking: No sig reported)     nystatin (MYCOSTATIN) 100000 UNIT/ML suspension Take 5 mLs (500,000 Units total) by mouth in the morning, at noon, and at bedtime. SWISH AND SPIT (Patient not taking: No sig reported) 473 mL 0   omeprazole (PRILOSEC) 20 MG capsule Take 1 capsule (20 mg total) by mouth daily. (Patient not taking: No sig reported) 90 capsule 1   ondansetron (ZOFRAN) 8 MG tablet Take 1 tablet (8 mg total) by mouth 2 (two) times daily as needed for refractory nausea / vomiting. Start on day 3 after carboplatin chemo. (Patient not taking: No sig  reported) 30 tablet 1   oxyCODONE (ROXICODONE) 5 MG/5ML solution PLACE 5-10 MLS (5-10 MG TOTAL) INTO FEEDING TUBE EVERY 4 (FOUR) HOURS AS NEEDED FOR SEVERE PAIN. (Patient not taking: Reported on 08/01/2021)     predniSONE (DELTASONE) 50 MG tablet Take 1 tablet (50 mg total) by mouth daily with breakfast. (Patient not taking: Reported on 08/01/2021) 7 tablet 0   pregabalin (LYRICA) 150 MG capsule Take 1 capsule (150 mg total) by mouth 2 (two) times daily. (Patient not taking: Reported on 08/01/2021) 60 capsule 3   prochlorperazine (COMPAZINE) 10 MG tablet Take 1 tablet (10 mg total) by mouth every 6 (six) hours as needed (Nausea or vomiting). (Patient not taking: No sig reported) 30 tablet 1   senna-docusate (SENOKOT-S) 8.6-50 MG tablet Take 2 tablets by mouth daily. (Patient not taking: No sig reported) 60 tablet 3   No current facility-administered medications for this visit.   Facility-Administered Medications Ordered in Other Visits  Medication Dose Route Frequency Provider Last Rate Last Admin   sodium chloride flush (NS) 0.9 % injection 10 mL  10 mL Intravenous PRN Earlie Server, MD       sodium chloride flush (NS) 0.9 %  injection 10 mL  10 mL Intravenous PRN Earlie Server, MD   10 mL at 10/13/20 1034    VITAL SIGNS: There were no vitals taken for this visit. There were no vitals filed for this visit.  Estimated body mass index is 27.94 kg/m as calculated from the following:   Height as of 05/05/21: '5\' 9"'  (1.753 m).   Weight as of an earlier encounter on 08/01/21: 85.8 kg (189 lb 3.2 oz).  LABS: CBC:    Component Value Date/Time   WBC 7.3 08/01/2021 0829   HGB 13.2 08/01/2021 0829   HCT 39.5 08/01/2021 0829   PLT 316 08/01/2021 0829   MCV 93.6 08/01/2021 0829   NEUTROABS 6.3 08/01/2021 0829   LYMPHSABS 0.3 (L) 08/01/2021 0829   MONOABS 0.5 08/01/2021 0829   EOSABS 0.1 08/01/2021 0829   BASOSABS 0.1 08/01/2021 0829   Comprehensive Metabolic Panel:    Component Value Date/Time   NA 134  (L) 07/08/2021 1025   K 4.4 07/08/2021 1025   CL 97 (L) 07/08/2021 1025   CO2 29 07/08/2021 1025   BUN 28 (H) 07/08/2021 1025   CREATININE 1.14 07/08/2021 1025   GLUCOSE 131 (H) 07/08/2021 1025   CALCIUM 9.1 07/08/2021 1025   AST 27 07/08/2021 1025   ALT 18 07/08/2021 1025   ALKPHOS 73 07/08/2021 1025   BILITOT 0.6 07/08/2021 1025   PROT 6.6 07/08/2021 1025   ALBUMIN 3.7 07/08/2021 1025     Present during today's visit: Patient only  Assessment and Plan: Start plan: Start today 08/01/21  Patient Education I spoke with patient for overview of new oral chemotherapy medication: Lenvima (lenvatinib) for the treatment of metastatic squamous cell carcinoma of oropharynx, in conjunction with pembrolizumab, planned duration until disease progression or unacceptable drug toxicity.   Administration: Counseled patient on administration, dosing, side effects, monitoring, drug-food interactions, safe handling, storage, and disposal.  Patient will take 2 capsules (20 mg total) through feeding tube daily around 8 AM. He is unable to take anything by mouth at this time.  Instructions for dissolving capsules provided (recommendation from the manufacturer):  Dissolve whole capsules in 15 mL (1 tbsp) of water and leave to sit for 10 minutes, then stir for 3 minutes. Transfer dissolved content to feeding tube. Add another 15 mL (1 tbsp) of water to container and swirl a few times, then transfer to feeding tube.  Side Effects: Side effects include but not limited to: Increased BP, diarrhea, fatigue, muscle/joint pain, N/V, decreased appetite, mouth sores, headache, hand-foot syndrome, renal impairment.   Increased BP: Patient reported not taking any BP medications due to low BP levels. BP today was elevated, from his normal low BP, at 117/89, possibly attributable to BP medication holding and white coat effect. He has a BP cuff at home and was advised to record readings using the provided log  sheets. Diarrhea: Patient reported using Miralax for opioid-induced constipation and was informed that the side effect of this medication may help counterbalance constipation. Recommended patient to have Imodium on hand and use at the first loose stool. Patient will call the cancer center if diarrhea is uncontrolled (4 or more loose stools) while on Imodium.  Fatigue: Patient used to take walks and reported feeling tired and not being able to return to previous baseline activity. Encouraged him to continue taking <1 hour naps during the day and choose activities to help him relax (e.g. listen to music). N/V: Patient reported having antiemetic medication on hand and will take  as needed. Mouth sores: Patient has Magic Mouthwash at home. He will check the current prescription and call the office if it has expired. Hand-foot syndrome: Instructed patient to keep hands and feet moisturized to help prevent HFS. He received a bottle of [place] Smooth Extra Care 20 today and noted that he still has some at home. Renal impairment: Encouraged patient to maintain adequate hydration to prevent kidney damage.  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 Reassured patient that this medication would not worsen symptoms of neuropathy.  Drug-drug Interactions (DDI): No identified.  Adherence: After discussion with patient, no patient barriers to medication adherence identified.  Reviewed with patient importance of keeping a medication schedule and plan for any missed doses.  Mr. Gildersleeve voiced understanding and appreciation. All questions answered. Medication handout provided with BP log sheets, capsule administration instructions, and lotion for HFS.  Provided patient with [facility] phone number. Patient knows to call the office with questions or concerns. [facility] will continue to follow.  Patient  expressed understanding and was in agreement with this plan. He also understands that He can call clinic at any time with any questions, concerns, or complaints.   Medication Access Issues: Appeal letter to [facility] was approved. However, patient has a high remaining copay. He was approved for manufacture assistance today and he will have medication delivered tomorrow 08/02/21.  Follow-up plan: Follow up in 3 weeks  Thank you for allowing me to participate in the care of this patient.   Time Total: 35 minutes    Visit consisted of counseling and education on dealing with issues of symptom management in the setting of serious and potentially life-threatening illness.Greater than 50%  of this time was spent counseling and coordinating care related to the above assessment and plan.  Signed by: Darl Pikes, PharmD, BCPS, Salley Slaughter, CPP Hematology/Oncology Clinical Pharmacist Practitioner [facility]/HP/[facility] [phone]  08/01/2021 9:58 AM

## 2021-08-01 NOTE — Progress Notes (Signed)
Hematology/Oncology  Follow Up note [facility] [phone] [phone]   Patient Care Team: Albina Billet, MD as PCP - General (Internal Medicine) Albina Billet, MD (Internal Medicine) Bary Castilla, Forest Gleason, MD (General Surgery) Noreene Filbert, MD as Referring Physician (Radiation Oncology) Earlie Server, MD as Consulting Physician (Oncology)  CHIEF COMPLAINTS/PURPOSE OF CONSULTATION:  Follow up for head and neck cancer.  HISTORY OF PRESENTING ILLNESS:  Ivan Garcia. is a  65 y.o.  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 P16 positive. cT2 cN2 disease, stage II, # 12/13/2017: PET whole body: Primary hypermetabolic mucosal lesion in the left oropharynx/tongue base with right-sided bulky multilevel lymphadenopathy. #12/31/2017. Concurrent ChemoRT [Cisplatin 100 mg/m2 q3weeks]  S/p one dose of Cisplatin. Cisplatin discontinued due to nephrotoxicity/AKI . switch to weekly Carboplatin (AUC 2) / Taxol 45m/m2 [ finished May 2019] # 03/08/2018: PET image skull base to midthigh restaging: Near complete resolution of prior large right neck mass. Small right cervical/supraclavicular nodal metastases measuring up to 10 mm short axis. No evidence of distant metastases.  # 07/02/2018 PET No evidence of residual carcinoma in the neck. No evidence of nodal metastasis. No evidence distant metastatic disease  # July 2020, he started to have hoarseness after singing for 2 to 3 hours. He has changed his insurance and Dr. MTami Ribasis no longer covered by his current insurance.  08/04/2019 establish care with Dr.[place]ylette Strubel [place] . Flexible laryngoscopy showed Vocal cord paralysis.  Patient was recommended to have a PET scan done for restaging. 08/20/2019 PET scan showed asymmetric hypermetabolic  involving the  left vocal cords with hypermetabolic adenopathy in the neck, and the chest.  He also has hypermetabolic pulmonary nodule along the minor fissure which is felt to be metastatic.  # 09/08/2019   ultrasound-guided right supraclavicular lymph node biopsy Pathology was positive for metastatic squamous cell carcinoma. # 09/08/2019 Omniseq NGS showed PD-L1 CPS 20, TMB indeterminant, MSI stable,PIK3CA [id number] # 09/29/2019- 01/2020  6 cycles of carboplatin, 5-FU and Keytruda, finished in February 2021.  #12/18/2019, PET/CT: marked improvement in met disease, continued on Keytruda maintenance. # 04/26/2020 PET worsening of disease in the chest.  Interval enlargement of the right thoracic inlet and paratracheal lymph nodes that were previously enlarged with new activity in the right hilum corresponding to a lymph node in this location.  Also increased activity in the right upper lobe nodule. Right level 3 lymph node in the neck and anterior mediastinal nodal activity is diminished.  These areas are quite small on prior study. Diminished symmetric uptake within the left as compared to the right vocal cord. # [facility] [place]  on 05/05/2020.  Patient was offered for clinical trial with pembrolizumab/lenvatinib versus standard therapy.  05/14/2021 Patient received additional 1 cycle of Keytruda while waiting to be enrolled to the trial.   #06/24/2020, CT neck [facility] right upper paratracheal node is concerning for metastatic disease and a new from 01/20/2020 PET/CT.  The node closely abuts and possibly invade the posterolateral right tracheal wall as well as the esophagus.  Right vocal cord paralysis.  Symmetric soft tissue density at the right base of tongue could reflect residual/recurrent primary malignancy versus posttreatment changes.  Right supraclavicular lymph node is no longer appreciated  with post treatment changes.  There is  increased size of right minor fissure nodule which was metabolically avid on previous PET scan.  New mediastinal and hilar lymphadenopathy concerning for new nodal disease.  No evidence of metastatic disease below the diaphragm.  Nonobstructing 3 mm distal ureter stone.- Disease progression.  Not eligible for the clinical trial at [facility]  # 07/07/2020 09/02/2020 palliative chemotherapy with 3 cycles of docetaxel  with G-CSF support.   # 09/02/2020,chest without contrast with 3D MIPS protocol showed Interval increase in the size of the right upper lobe perifissural nodule 1.6 x 1.5 cm-previously 1.4 x 1.2 cm tracheoesophageal soft tissue mass 3.4 x 5.0 cm with regional mass-effect on the esophagus-previously 2.4 x 3.6 cm, and mediastinal lymphadenopathy-right upper paratracheal lymph node 1.9 cm-previously 1.5 cm, suspicious for progressive metastatic disease.Subcentimeter lung nodules are stable. CT neck with contrast showed slight interval increase in size of upper right paratracheal nodal conglomerate.  Questionable soft tissue invasion of the posterior tracheal appears worsened.  Compression with potential invasion of the esophagus is again noted.  Prior vocal cord paralysis again noted.  Unchanged right tongue base asymmetry. I had a discussion with patient's [facility] oncologist Dr. Barrington Ellison over the phone. Dr.Choe recommended adding carboplatin AUC 5 to docetaxel regimen for now which hopefully to help to reduce the size of the tracheoesophageal soft tissue mass.  Also recommend palliative radiation. # 09/07/2020, carboplatin AUC 5+ docetaxel # 10/21/2021S/p PEG tube placement   #09/28/2020- 10/25/20 patient finished palliative radiation to esophageal mass. #[facility] Maxie Better   09/29/2020  guardant 360 which showed PIK3CA E545K, PIK3CA amplification. MSI stable, APC E2172K VUS , APC L2138L, FGFR2 R573 VUS   . # 11/09/2020, patient had a PET scan which showed partial response Right  thoracic inlet Hypermetabolic soft tissue metastasis, 3.9 x 2.6cm, decreased in size, decreased size of  Hypermetabolic right paratracheal 1.2cm, and right hilar nodal Metastases since outside 09/02/2020 neck CT Right upper lobe lung nodule 1.2cm, stable size.  RadOnc Dr. Baruch Gouty recommend additional radiation.  #11/23/20-12/17/19 patient finished additional radiation # 12/22/2020,Piqray 39m daily  discontinued on 03/31/2021 04/01/2021 CT Neck/Chest abdomen pelvis  Progressive enlargement of the right larynx and supraglottic tissues with low-density most compatible with edema from radiation. Correlate with direct visualization mucosa to evaluate for tumor recurrence in this area Right paraesophageal lymph node mass is ill-defined and unchanged in size. Possible invasion of the esophagus.  # [facility] 1 day 1 on 05/04/2021.  Unfortunately patient developed grade 4 infusion related reaction, syncope while on toilet which progressed to cardiopulmonary arrest requiring CPR for 30 to 60 seconds until ROSC, epinephrine, sent to emergency room and he was admitted for 24 hours.  He has sinus tachycardia and had a TEE done which normal LVEF.  [facility]  Patient was seen by Dr.Lycan on 05/13/2021, was recommended to allow cetuximab washout and also recently being informed that he is not eligible for another clinical trial at this point. Patient was also informed that he was not eligible for another clinical try at this point.  #[facility]20[facility] Dr. WMariel Kansky  Several options were discussed. First-in-human Study of DRP-104 (Sirpiglenastat) as Single Agent and in Combination With [employer] Alternative approaches was desensitization to cetuximab in a controlled intensity monitor for any environment.  Keytruda and lenvatinib, based current promising OR ranging from 28  to 36% and DFS 4-8  months. [Journal of Clinical Oncology 36, no. 15_suppl (Apr 22, 2017) 6016-6016], [employer] 2021 Apr ;84(4):361-367]. Patient is interested in proceeding with Keytruda and lenvatinib locally at [facility].  INTERVAL HISTORY Ivan Garcia. is a 65 y.o. male who presents for follow-up for metastatic squamous cell carcinoma, P 16+.   Patient has establish care with neurology for carpal tunnel syndrome. Today he presents to be evaluated prior to [facility] and lenvatinib treatments. Patient continues to have severe dysphagia.  He is on tube feeding via PEG tube.  Denies any fever, chills, shortness of breath.   Review of Systems  Constitutional:  Negative for chills, fever, malaise/fatigue and weight loss.  HENT:  Negative for sore throat.        Dysphagia   Eyes:  Negative for redness.  Respiratory:  Negative for cough, shortness of breath and wheezing.   Cardiovascular:  Negative for chest pain, palpitations and leg swelling.  Gastrointestinal:  Negative for abdominal pain, blood in stool, nausea and vomiting.  Genitourinary:  Negative for dysuria.  Musculoskeletal:  Negative for myalgias.  Skin:  Negative for rash.  Neurological:  Negative for dizziness, tingling and tremors.  Endo/Heme/Allergies:  Does not bruise/bleed easily.  Psychiatric/Behavioral:  Negative for hallucinations.    MEDICAL HISTORY:  Past Medical History:  Diagnosis Date   Arthritis    Cancer ([place])    Head and neck cancer   Hemorrhoids    Hyperlipidemia    Hypertension     SURGICAL HISTORY: Past Surgical History:  Procedure Laterality Date   COLONOSCOPY WITH PROPOFOL N/A 04/04/2017   Procedure: COLONOSCOPY WITH PROPOFOL;  Surgeon: Robert Bellow, MD;  Location: [facility] ENDOSCOPY;  Service: Endoscopy;  Laterality: N/A;   IR GASTROSTOMY TUBE MOD SED  09/24/2020   MANDIBLE SURGERY     PORT-A-CATH REMOVAL  07/17/2018   PORTA CATH INSERTION N/A 12/19/2017   Procedure: PORTA CATH INSERTION;  Surgeon:  Algernon Huxley, MD;  Location: [place] CV LAB;  Service: Cardiovascular;  Laterality: N/A;   PORTA CATH INSERTION N/A 07/17/2018   Procedure: PORTA CATH INSERTION;  Surgeon: Algernon Huxley, MD;  Location: [place] CV LAB;  Service: Cardiovascular;  Laterality: N/A;   PORTA CATH INSERTION N/A 09/18/2019   Procedure: PORTA CATH INSERTION;  Surgeon: Algernon Huxley, MD;  Location: [place] CV LAB;  Service: Cardiovascular;  Laterality: N/A;   TONSILLECTOMY      SOCIAL HISTORY: Social History   Socioeconomic History   Marital status: Single    Spouse name: Not on file   Number of children: Not on file   Years of education: Not on file   Highest education level: Not on file  Occupational History   Not on file  Tobacco Use   Smoking status: Never   Smokeless tobacco: Never  Vaping Use   Vaping Use: Never used  Substance and Sexual Activity   Alcohol use: No   Drug use: No   Sexual activity: Not on file  Other Topics Concern   Not on file  Social History Narrative   Not on file   Social Determinants of Health   Financial Resource Strain: Not on file  Food Insecurity: Not on file  Transportation Needs: Not on file  Physical Activity: Not on file  Stress: Not on file  Social Connections: Not on file  Intimate Partner Violence: Not on file    FAMILY HISTORY: Family History  Problem Relation Age  of Onset   Breast cancer Mother    Asthma Mother    Congestive Heart Failure Mother    Prostate cancer Father    Brain cancer Father    Bladder Cancer Father     ALLERGIES:  is allergic to cetuximab.  MEDICATIONS:  Current Outpatient Medications  Medication Sig Dispense Refill   HYDROcodone-acetaminophen (NORCO) 10-325 MG tablet Take 1 tablet by mouth every 6 (six) hours as needed. 60 tablet 0   acetaminophen (TYLENOL) 160 MG/5ML solution Take by mouth. (Patient not taking: No sig reported)     amLODipine (NORVASC) 5 MG tablet Take 5 mg by mouth daily. (Patient not  taking: No sig reported)     atorvastatin (LIPITOR) 20 MG tablet SMARTSIG:1 Tablet(s) By Mouth Every Evening (Patient not taking: No sig reported)     cetirizine (ZYRTEC) 10 MG tablet Take by mouth. (Patient not taking: No sig reported)     famotidine (PEPCID) 20 MG tablet Take by mouth. (Patient not taking: No sig reported)     gabapentin (NEURONTIN) 100 MG capsule TAKE 2 CAPSULES BY MOUTH 3 TIMES DAILY. (Patient not taking: Reported on 08/01/2021)     ibuprofen (ADVIL) 100 MG/5ML suspension Take by mouth. (Patient not taking: No sig reported)     lenvatinib 20 mg daily dose (LENVIMA, 20 MG DAILY DOSE,) 2 x 10 MG capsule Take 2 capsules (20 mg total) by mouth daily. (Patient not taking: No sig reported) 60 capsule 0   lidocaine-prilocaine (EMLA) cream Apply to affected area once (Patient not taking: Reported on 08/01/2021) 30 g 3   metoprolol succinate (TOPROL-XL) 25 MG 24 hr tablet Take 1 tablet by mouth daily. (Patient not taking: No sig reported)     nystatin (MYCOSTATIN) 100000 UNIT/ML suspension Take 5 mLs (500,000 Units total) by mouth in the morning, at noon, and at bedtime. SWISH AND SPIT (Patient not taking: No sig reported) 473 mL 0   omeprazole (PRILOSEC) 20 MG capsule Take 1 capsule (20 mg total) by mouth daily. (Patient not taking: No sig reported) 90 capsule 1   ondansetron (ZOFRAN) 8 MG tablet Take 1 tablet (8 mg total) by mouth 2 (two) times daily as needed for refractory nausea / vomiting. Start on day 3 after carboplatin chemo. (Patient not taking: No sig reported) 30 tablet 1   oxyCODONE (ROXICODONE) 5 MG/5ML solution PLACE 5-10 MLS (5-10 MG TOTAL) INTO FEEDING TUBE EVERY 4 (FOUR) HOURS AS NEEDED FOR SEVERE PAIN. (Patient not taking: Reported on 08/01/2021)     predniSONE (DELTASONE) 50 MG tablet Take 1 tablet (50 mg total) by mouth daily with breakfast. (Patient not taking: Reported on 08/01/2021) 7 tablet 0   pregabalin (LYRICA) 150 MG capsule Take 1 capsule (150 mg total) by mouth 2  (two) times daily. (Patient not taking: Reported on 08/01/2021) 60 capsule 3   prochlorperazine (COMPAZINE) 10 MG tablet Take 1 tablet (10 mg total) by mouth every 6 (six) hours as needed (Nausea or vomiting). (Patient not taking: No sig reported) 30 tablet 1   senna-docusate (SENOKOT-S) 8.6-50 MG tablet Take 2 tablets by mouth daily. (Patient not taking: No sig reported) 60 tablet 3   No current facility-administered medications for this visit.   Facility-Administered Medications Ordered in Other Visits  Medication Dose Route Frequency Provider Last Rate Last Admin   heparin lock flush 100 UNIT/ML injection            sodium chloride flush (NS) 0.9 % injection 10 mL  10 mL Intravenous  PRN Earlie Server, MD       sodium chloride flush (NS) 0.9 % injection 10 mL  10 mL Intravenous PRN Earlie Server, MD   10 mL at 10/13/20 1034     PHYSICAL EXAMINATION: ECOG PERFORMANCE STATUS: 1 - Symptomatic but completely ambulatory Vitals:   08/01/21 0911  BP: 117/89  Pulse: (!) 103  Resp: 18  Temp: (!) 96.2 F (35.7 C)  SpO2: 97%   Physical Exam Constitutional:      General: He is not in acute distress.    Appearance: He is not diaphoretic.  HENT:     Head: Normocephalic and atraumatic.     Nose: Nose normal.     Mouth/Throat:     Pharynx: No oropharyngeal exudate.  Eyes:     General: No scleral icterus.    Pupils: Pupils are equal, round, and reactive to light.  Neck:     Comments: Right cervical lymphadenopathy Cardiovascular:     Rate and Rhythm: Normal rate and regular rhythm.     Heart sounds: No murmur heard. Pulmonary:     Effort: Pulmonary effort is normal. No respiratory distress.     Breath sounds: No rales.  Chest:     Chest wall: No tenderness.  Abdominal:     General: There is no distension.     Palpations: Abdomen is soft.     Tenderness: There is no abdominal tenderness.     Comments: +PEG tube  Musculoskeletal:        General: Normal range of motion.     Cervical back:  Normal range of motion and neck supple.  Lymphadenopathy:     Cervical: Cervical adenopathy present.  Skin:    General: Skin is warm and dry.     Findings: No erythema.  Neurological:     Mental Status: He is alert and oriented to person, place, and time.     Cranial Nerves: No cranial nerve deficit.     Motor: No abnormal muscle tone.     Coordination: Coordination normal.  Psychiatric:        Mood and Affect: Affect normal.     LABORATORY DATA:  I have reviewed the data as listed Lab Results  Component Value Date   WBC 7.3 08/01/2021   HGB 13.2 08/01/2021   HCT 39.5 08/01/2021   MCV 93.6 08/01/2021   PLT 316 08/01/2021   Recent Labs    08/17/20 0804 08/23/20 1414 08/31/20 1244 09/07/20 0833 05/18/21 1320 07/08/21 1025 08/01/21 0829  NA 139 137 137   < > 129* 134* 130*  K 4.0 3.6 4.5   < > 4.2 4.4 4.1  CL 105 100 98   < > 94* 97* 94*  CO2 '25 26 27   ' < > '28 29 28  ' GLUCOSE 101* 129* 134*   < > 132* 131* 118*  BUN 23 21 25*   < > 26* 28* 23  CREATININE 1.52* 1.50* 1.64*   < > 0.98 1.14 1.06  CALCIUM 9.2 8.9 9.3   < > 8.9 9.1 8.8*  GFRNONAA 47* 48* 43*   < > >60 >60 >60  GFRAA 55* 56* 50*  --   --   --   --   PROT 7.4 7.0 7.3   < > 6.5 6.6 6.3*  ALBUMIN 3.5 3.5 3.5   < > 3.6 3.7 3.7  AST '19 18 20   ' < > '22 27 21  ' ALT '16 16 17   ' < >  '19 18 17  ' ALKPHOS 82 93 103   < > 74 73 68  BILITOT 0.7 0.8 0.6   < > 0.4 0.6 0.6   < > = values in this interval not displayed.   RADIOGRAPHIC STUDIES: I have personally reviewed the radiological images as listed and agreed with the findings in the report. MR Cervical Spine W Wo Contrast  Result Date: 05/31/2021 CLINICAL DATA:  Oropharyngeal cancer with recurrence. C7 para esophageal tumor. Follow-up response to treatment. EXAM: MRI CERVICAL SPINE WITHOUT AND WITH CONTRAST TECHNIQUE: Multiplanar and multiecho pulse sequences of the cervical spine, to include the craniocervical junction and cervicothoracic junction, were obtained  without and with intravenous contrast. CONTRAST:  8m GADAVIST GADOBUTROL 1 MMOL/ML IV SOLN COMPARISON:  MRI cervical spine 04/25/2021. CT neck soft tissues 04/01/2021 FINDINGS: Alignment: Normal Vertebrae: Bone marrow shows diffuse fatty replacement due to radiation in the neck. There are small areas of bone marrow edema and enhancement anteriorly at C5, C6, C7, and T1 unchanged from the recent MRI. This may be due to direct tumor invasion from the right para esophageal soft tissue mass previously noted at the thoracic inlet. No vertebral fracture. No new vertebral lesion. Cord: Normal signal and morphology Posterior Fossa, vertebral arteries, paraspinal tissues: Soft tissue mass to the right of the esophagus at the thoracic inlet again noted. This is difficult to compare with the prior CT but grossly appear similar. Disc levels: C2-3: Negative C3-4: Disc degeneration with uncinate spurring. Left-sided facet degeneration. Moderate left foraminal stenosis C4-5: Mild degenerative change.  Negative for stenosis C5-6: Diffuse uncinate spurring bilaterally causing moderate foraminal encroachment bilaterally. Mild spinal stenosis C6-7: Negative for stenosis. C7-T1: Negative IMPRESSION: Post radiation changes in the bone marrow of the cervical spine. Stable small areas of bone marrow edema and enhancement C5 through T1 anteriorly which may be due to tumor which has been treated and is stable. Soft tissue mass to the right of the esophagus at the thoracic inlet grossly stable from prior CT. Electronically Signed   By: CFranchot GalloM.D.   On: 05/31/2021 14:25      ASSESSMENT & PLAN:   1. 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[place]   2. 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 Stage 3a chronic kidney disease ([place]   4. Encounter for antineoplastic chemotherapy   5. Carpal tunnel syndrome of right wrist   6. Dysphagia, unspecified type    #Metastatic squamous cell carcinoma of oropharynx- cervical  lymphadenopathy, lung metastatic disease, local invasion of esophagus. Labs reviewed and discussed with patient. Rationale and potential side effects of Keytruda and lenvatinib were discussed with the patient.  He agrees with the [facility]  I advised patient to start lenvatinib 20 mg daily.  # Dysphagia + PEG, continue tube feeding.  continue ollow-up with nutritionist. # Neoplasm associated pain, continue current regimen [place]325 mg every 6 hours as needed # Right upper extremity neuropathy, patient has tried a course of prednisone.  Continue Lyrica 1576mBID follow-up with neurology for right hand injection.  Follow-up: 1 week for evaluation of tolerability.  ZhEarlie ServerMD, PhD Hematology Oncology [facility]/29/2022

## 2021-08-01 NOTE — Telephone Encounter (Signed)
Oral Oncology Patient Advocate Encounter  Met patient in office on 6/58/00 to complete application for [place] Patient Assistance in an effort to reduce patient's out of pocket expense for Lenvima to $0.    Application completed and faxed to [phone] on 07/29/21.   [employer] patient assistance phone number for follow up is [phone].   This encounter will be updated until final determination.   [place] Patient [place] Phone [phone] Fax [phone] 08/01/2021 11:42 AM

## 2021-08-01 NOTE — Progress Notes (Signed)
Pt here for follow up. No new concerns voiced.   

## 2021-08-01 NOTE — Telephone Encounter (Signed)
Oral Oncology Patient Advocate Ivan Garcia faxed notification on 07/29/21 that the denial for Lenvima had been overturned.  Ivan Garcia is approved from 07/27/21 to 12/03/2038.  Auth # [id number]  Patients copay is $3432.88.  [place] Patient Ivan Garcia Phone [phone] Fax [phone] 08/01/2021 11:35 AM

## 2021-08-01 NOTE — Progress Notes (Signed)
Per MD ok to treat with HR 103

## 2021-08-01 NOTE — Telephone Encounter (Signed)
Oral Oncology Patient Advocate Encounter  Received notification from [facility] Patient Assistance program that patient has been successfully enrolled into their program to receive Lenvima from the manufacturer at $0 out of pocket until 12/03/21.    Eisai called and spoke with patient. First shipment of Ivan Garcia will be delivered on 08/02/21.   Specialty Pharmacy that will dispense medication is [facility].  Patient knows to call the office with questions or concerns.   Oral Oncology Clinic will continue to follow.  [place] Patient [place] Phone [phone] Fax [phone] 08/01/2021 11:43 AM

## 2021-08-01 NOTE — Telephone Encounter (Signed)
Oral Oncology Patient Advocate Encounter  An urgent appeal was filed for the prior authorization denial of Lenvima.   Appeal packet has been faxed to [phone].    The insurance company states that a 72 hour turn around time is to be expected for urgent redeterminations of denial.   This encounter will continue to be updated until final appeal determination.    [place] Patient [place] Phone [phone] Fax [phone] 08/01/2021 11:34 AM

## 2021-08-01 NOTE — Patient Instructions (Signed)
[facility]  Discharge Instructions: Thank you for choosing [facility] to provide your oncology and hematology care.  If you have a lab appointment with the Cancer Center, please go directly to the Cancer Center and check in at the registration area.  Wear comfortable clothing and clothing appropriate for easy access to any Portacath or PICC line.   We strive to give you quality time with your provider. You may need to reschedule your appointment if you arrive late (15 or more minutes).  Arriving late affects you and other patients whose appointments are after yours.  Also, if you miss three or more appointments without notifying the office, you may be dismissed from the clinic at the provider's discretion.      For prescription refill requests, have your pharmacy contact our office and allow 72 hours for refills to be completed.    Today you received the following chemotherapy and/or immunotherapy agents Keytruda      To help prevent nausea and vomiting after your treatment, we encourage you to take your nausea medication as directed.  BELOW ARE SYMPTOMS THAT SHOULD BE REPORTED IMMEDIATELY: *FEVER GREATER THAN 100.4 F (38 C) OR HIGHER *CHILLS OR SWEATING *NAUSEA AND VOMITING THAT IS NOT CONTROLLED WITH YOUR NAUSEA MEDICATION *UNUSUAL SHORTNESS OF BREATH *UNUSUAL BRUISING OR BLEEDING *URINARY PROBLEMS (pain or burning when urinating, or frequent urination) *BOWEL PROBLEMS (unusual diarrhea, constipation, pain near the anus) TENDERNESS IN MOUTH AND THROAT WITH OR WITHOUT PRESENCE OF ULCERS (sore throat, sores in mouth, or a toothache) UNUSUAL RASH, SWELLING OR PAIN  UNUSUAL VAGINAL DISCHARGE OR ITCHING   Items with * indicate a potential emergency and should be followed up as soon as possible or go to the Emergency Department if any problems should occur.  Please show the CHEMOTHERAPY ALERT CARD or IMMUNOTHERAPY ALERT CARD at check-in to  the Emergency Department and triage nurse.  Should you have questions after your visit or need to cancel or reschedule your appointment, please contact [facility]  [phone] and follow the prompts.  Office hours are 8:00 a.m. to 4:30 p.m. Monday - Friday. Please note that voicemails left after 4:00 p.m. may not be returned until the following business day.  We are closed weekends and major holidays. You have access to a nurse at all times for urgent questions. Please call the main number to the clinic [phone] and follow the prompts.  For any non-urgent questions, you may also contact your provider using MyChart. We now offer e-Visits for anyone 18 and older to request care online for non-urgent symptoms. For details visit [email or web address].   Also download the MyChart app! Go to the app store, search "MyChart", open the app, select [facility], and log in with your MyChart username and password.  Due to Covid, a mask is required upon entering the hospital/clinic. If you do not have a mask, one will be given to you upon arrival. For doctor visits, patients may have 1 support person aged 18 or older with them. For treatment visits, patients cannot have anyone with them due to current Covid guidelines and our immunocompromised population. Pembrolizumab injection What is this medication? PEMBROLIZUMAB (pem broe liz ue mab) is a monoclonal antibody. It is used totreat certain types of cancer. This medicine may be used for other purposes; ask your health care provider orpharmacist if you have questions. COMMON BRAND NAME(S): Keytruda What should I tell my care team before I take this   medication? They need to know if you have any of these conditions: autoimmune diseases like Crohn's disease, ulcerative colitis, or lupus have had or planning to have an allogeneic stem cell transplant (uses someone else's stem cells) history of organ transplant history of  chest radiation nervous system problems like myasthenia gravis or Guillain-Barre syndrome an unusual or allergic reaction to pembrolizumab, other medicines, foods, dyes, or preservatives pregnant or trying to get pregnant breast-feeding How should I use this medication? This medicine is for infusion into a vein. It is given by a health careprofessional in a hospital or clinic setting. A special MedGuide will be given to you before each treatment. Be sure to readthis information carefully each time. Talk to your pediatrician regarding the use of this medicine in children. While this drug may be prescribed for children as young as 6 months for selectedconditions, precautions do apply. Overdosage: If you think you have taken too much of this medicine contact apoison control center or emergency room at once. NOTE: This medicine is only for you. Do not share this medicine with others. What if I miss a dose? It is important not to miss your dose. Call your doctor or health careprofessional if you are unable to keep an appointment. What may interact with this medication? Interactions have not been studied. This list may not describe all possible interactions. Give your health care provider a list of all the medicines, herbs, non-prescription drugs, or dietary supplements you use. Also tell them if you smoke, drink alcohol, or use illegaldrugs. Some items may interact with your medicine. What should I watch for while using this medication? Your condition will be monitored carefully while you are receiving thismedicine. You may need blood work done while you are taking this medicine. Do not become pregnant while taking this medicine or for 4 months after stopping it. Women should inform their doctor if they wish to become pregnant or think they might be pregnant. There is a potential for serious side effects to an unborn child. Talk to your health care professional or pharmacist for more information. Do  not breast-feed an infant while taking this medicine orfor 4 months after the last dose. What side effects may I notice from receiving this medication? Side effects that you should report to your doctor or health care professionalas soon as possible: allergic reactions like skin rash, itching or hives, swelling of the face, lips, or tongue bloody or black, tarry breathing problems changes in vision chest pain chills confusion constipation cough diarrhea dizziness or feeling faint or lightheaded fast or irregular heartbeat fever flushing joint pain low blood counts - this medicine may decrease the number of white blood cells, red blood cells and platelets. You may be at increased risk for infections and bleeding. muscle pain muscle weakness pain, tingling, numbness in the hands or feet persistent headache redness, blistering, peeling or loosening of the skin, including inside the mouth signs and symptoms of high blood sugar such as dizziness; dry mouth; dry skin; fruity breath; nausea; stomach pain; increased hunger or thirst; increased urination signs and symptoms of kidney injury like trouble passing urine or change in the amount of urine signs and symptoms of liver injury like dark urine, light-colored stools, loss of appetite, nausea, right upper belly pain, yellowing of the eyes or skin sweating swollen lymph nodes weight loss Side effects that usually do not require medical attention (report to yourdoctor or health care professional if they continue or are bothersome): decreased appetite hair loss tiredness   This list may not describe all possible side effects. Call your doctor for medical advice about side effects. You may report side effects to FDA at1-800-FDA-1088. Where should I keep my medication? This drug is given in a hospital or clinic and will not be stored at home. NOTE: This sheet is a summary. It may not cover all possible information. If you have questions about  this medicine, talk to your doctor, pharmacist, orhealth care provider.  2022 Elsevier/Gold Standard (2019-10-22 21:44:53)  

## 2021-08-02 ENCOUNTER — Other Ambulatory Visit: Payer: Self-pay | Admitting: Oncology

## 2021-08-02 LAB — T4: T4, Total: 2.4 ug/dL — ABNORMAL LOW (ref 4.5–12.0)

## 2021-08-02 MED ORDER — LEVOTHYROXINE SODIUM 50 MCG PO TABS: 50.0000 ug | ORAL_TABLET | Freq: Every day | ORAL | 1 refills | Status: DC

## 2021-08-03 ENCOUNTER — Telehealth: Payer: Self-pay

## 2021-08-03 NOTE — Telephone Encounter (Signed)
-----   Message from Earlie Server, MD sent at 08/02/2021 10:55 PM EDT ----- Let him know that he has developed low thyroid level. Recommend him to start synthroid. Rx sent to pharamcy

## 2021-08-03 NOTE — Telephone Encounter (Signed)
Pt informed

## 2021-08-04 ENCOUNTER — Other Ambulatory Visit: Payer: Self-pay

## 2021-08-04 ENCOUNTER — Encounter: Payer: Self-pay | Admitting: Radiation Oncology

## 2021-08-04 ENCOUNTER — Ambulatory Visit
Admission: RE | Admit: 2021-08-04 | Discharge: 2021-08-04 | Disposition: A | Discharge status: Home / Self Care | Payer: Medicare Other | Source: Ambulatory Visit | Attending: Radiation Oncology | Admitting: Radiation Oncology

## 2021-08-04 ENCOUNTER — Inpatient Hospital Stay: Payer: Medicare Other | Attending: Hospice and Palliative Medicine | Admitting: Hospice and Palliative Medicine

## 2021-08-04 ENCOUNTER — Inpatient Hospital Stay: Payer: Medicare Other

## 2021-08-04 VITALS — BP 126/94 | HR 100 | Temp 98.2°F | Resp 20 | Wt 185.0 lb

## 2021-08-04 DIAGNOSIS — C01 Malignant neoplasm of base of tongue: Secondary | ICD-10-CM | POA: Diagnosis not present

## 2021-08-04 DIAGNOSIS — C7951 Secondary malignant neoplasm of bone: Secondary | ICD-10-CM | POA: Insufficient documentation

## 2021-08-04 DIAGNOSIS — Z923 Personal history of irradiation: Secondary | ICD-10-CM | POA: Diagnosis not present

## 2021-08-04 DIAGNOSIS — Z9221 Personal history of antineoplastic chemotherapy: Secondary | ICD-10-CM | POA: Insufficient documentation

## 2021-08-04 DIAGNOSIS — C109 Malignant neoplasm of oropharynx, unspecified: Secondary | ICD-10-CM

## 2021-08-04 DIAGNOSIS — Z931 Gastrostomy status: Secondary | ICD-10-CM | POA: Diagnosis not present

## 2021-08-04 DIAGNOSIS — Z515 Encounter for palliative care: Secondary | ICD-10-CM

## 2021-08-04 DIAGNOSIS — C778 Secondary and unspecified malignant neoplasm of lymph nodes of multiple regions: Secondary | ICD-10-CM | POA: Insufficient documentation

## 2021-08-04 NOTE — Progress Notes (Addendum)
Nutrition Follow-up:   Patient with metastatic squamous cell carcinoma, p 16 +.  S/p PEG placement on 09/24/20.  Patient started on lenvima and pembrolizumab.  Spoke with patient via phone.  Patient reports that cap on end of tube broke off.  Currently still has the cap but not attached to the tube.  Reports that he has been giving 4 cartons of [employer] 1.4 but starting back giving 5 cartons as feeling weak and fatigued and has lost some weight (to some degree wanted weight loss).  Has been also doing water flush, less pedialyte due to expense.  Says that he is going to have carpel tunnel surgery soon.     Medications: reviewed  Labs: Na 130, creatinine 1.06  Anthropometrics:   Weight 189 lb 3.2 oz on 8/29  200 lb 11.2 oz on 6/22 200 lb on 6/16 198 lb on 5/3 188 lb on 3/31 185 lb on 3/3 182 lb on 1/18  NUTRITION DIAGNOSIS: Inadequate oral intake continues but relying on feeding tube for nutrition   INTERVENTION:  Patient to give 5 cartons of [employer] 1.4 via feeding tube, unable to take oral intake and weight decreasing.  Flush with 163m fluid/pedialyte with each feeding.   RD called [employer] for possible stopper/cap replacement.  [employer] to send out y connector to see if it will work.     MONITORING, EVALUATION, GOAL: weight trends, tube feeding   NEXT VISIT: Monday, Sept 19th during infusion  Aleza Pew B. AZenia Resides RMesa LLanarkRegistered Dietitian [phone](mobile)

## 2021-08-04 NOTE — Progress Notes (Signed)
Virtual Visit via Telephone Note  I connected with Ivan Garcia. on 08/04/21 at 10:30 AM EDT by telephone and verified that I am speaking with the correct person using two identifiers.  Location: Patient: Home Provider: Clinic   I discussed the limitations, risks, security and privacy concerns of performing an evaluation and management service by telephone and the availability of in person appointments. I also discussed with the patient that there may be a patient responsible charge related to this service. The patient expressed understanding and agreed to proceed.   History of Present Illness: Ivan Garcia. is a 65 y.o. 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  Most recently on docetaxel.    He is status post PEG.  Patient is referred to palliative care to help address goals and manage ongoing symptoms.   Observations/Objective: Patient received steroid injection to wrist yesterday but says that it did not help his pain or symptoms.  He is pending carpal tunnel surgery in 3 weeks by Dr. Rudene Christians.  Patient denies other significant changes or concerns.  He does plan to see nutrition later today.  Patient says that a tab broke off of his PEG tube and he would like it evaluated.  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  He is followed by nutritionist.  Follow Up Instructions: Follow-up MyChart visit 2 to 3 months   I discussed the assessment and treatment plan with the patient. The patient was provided an opportunity to ask questions and all were answered. The patient agreed with the plan and demonstrated an understanding of the  instructions.   The patient was advised to call back or seek an in-person evaluation if the symptoms worsen or if the condition fails to improve as anticipated.  I provided 10 minutes of non-face-to-face time during this encounter.   Irean Hong, NP

## 2021-08-04 NOTE — Progress Notes (Signed)
Radiation Oncology Follow up Note  Name: Ivan Garcia [facility].   Date:   08/04/2021 MRN:  [id number] DOB: 06/18/1956    This 65 y.o. male presents to the clinic for 29-monthfollow-up status post split course radiation to chest and supraclavicular region for local advanced squamous cell carcinoma.  Of the oropharynx  REFERRING PROVIDER: TAlbina Billet MD  HPI: Patient is a 65year old male now out 7 months having completed radiation therapy to his chest and supraclavicular region for metastatic stage IV oropharyngeal carcinoma..Marland Kitchen He received concurrent chemoradiation therapy back in 2019 for lesion in the left oropharynx with base of tongue involvement and right-sided bulky adenopathy.  He had a complete response by PET/CT criteria.  He got a delayed PET CT scan back in 2020 which showed hypermetabolic adenopathy in the head and neck region also the chest.  Biopsy again was positive for metastatic squamous cell carcinoma.  He underwent 6 cycles of carboplatin 5-FU and Keytruda.  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  [facility] len[facility] which he is tolerating well.  He does have a PEG tube which he uses for nutritional intake.  COMPLICATIONS OF TREATMENT: none  FOLLOW UP COMPLIANCE: keeps appointments   PHYSICAL EXAM:  BP (!) 126/94   Pulse 100   Temp 98.2 F (36.8 C) (Tympanic)   Resp 20   Wt 185 lb (83.9 kg)   BMI 27.32 kg/m  Patient has PEG tube placed.  No evidence of cervical adenopathy is identified.  Well-developed well-nourished patient in NAD. HEENT reveals PERLA, EOMI, discs not visualized.  Oral cavity is clear. No oral mucosal lesions are identified. Neck is clear without evidence of cervical or supraclavicular adenopathy. Lungs are clear to A&P. Cardiac examination is essentially unremarkable with regular rate and rhythm without murmur rub or thrill. Abdomen is benign with no  organomegaly or masses noted. Motor sensory and DTR levels are equal and symmetric in the upper and lower extremities. Cranial nerves II through XII are grossly intact. Proprioception is intact. No peripheral adenopathy or edema is identified. No motor or sensory levels are noted. Crude visual fields are within normal range.  RADIOLOGY RESULTS: PET CT scan MRI scans CT scans of head and neck all reviewed compatible with above-stated findings  PLAN: Present time he continues on immunotherapy under medical oncology's direction.  He is peers stable at this time.  [facility].  I will see him back in 6 months for follow-up I be happy to reevaluate him at any time should further radiation would be indicated such as for his metastatic disease in his spine.  Patient comprehends my recommendations well.  I would like to take this opportunity to thank you for allowing me to participate in the care of your patient..Noreene Filbert MD

## 2021-08-08 ENCOUNTER — Encounter: Payer: Self-pay | Admitting: Radiology

## 2021-08-08 ENCOUNTER — Inpatient Hospital Stay
Admission: EM | Admit: 2021-08-08 | Discharge: 2021-08-14 | DRG: 871 | Disposition: A | Discharge status: Other Acute Inpatient Hospital | Payer: Medicare Other | Attending: Internal Medicine | Admitting: Internal Medicine

## 2021-08-08 ENCOUNTER — Other Ambulatory Visit: Payer: Self-pay

## 2021-08-08 ENCOUNTER — Emergency Department: Payer: Medicare Other

## 2021-08-08 DIAGNOSIS — N179 Acute kidney failure, unspecified: Secondary | ICD-10-CM

## 2021-08-08 DIAGNOSIS — I110 Hypertensive heart disease with heart failure: Secondary | ICD-10-CM | POA: Diagnosis present

## 2021-08-08 DIAGNOSIS — N178 Other acute kidney failure: Secondary | ICD-10-CM

## 2021-08-08 DIAGNOSIS — Z888 Allergy status to other drugs, medicaments and biological substances status: Secondary | ICD-10-CM

## 2021-08-08 DIAGNOSIS — M4802 Spinal stenosis, cervical region: Secondary | ICD-10-CM | POA: Diagnosis present

## 2021-08-08 DIAGNOSIS — I429 Cardiomyopathy, unspecified: Secondary | ICD-10-CM | POA: Diagnosis present

## 2021-08-08 DIAGNOSIS — G540 Brachial plexus disorders: Secondary | ICD-10-CM | POA: Diagnosis present

## 2021-08-08 DIAGNOSIS — G8321 Monoplegia of upper limb affecting right dominant side: Secondary | ICD-10-CM | POA: Diagnosis not present

## 2021-08-08 DIAGNOSIS — J90 Pleural effusion, not elsewhere classified: Secondary | ICD-10-CM

## 2021-08-08 DIAGNOSIS — E872 Acidosis: Secondary | ICD-10-CM | POA: Diagnosis present

## 2021-08-08 DIAGNOSIS — E785 Hyperlipidemia, unspecified: Secondary | ICD-10-CM | POA: Diagnosis present

## 2021-08-08 DIAGNOSIS — G894 Chronic pain syndrome: Secondary | ICD-10-CM | POA: Diagnosis present

## 2021-08-08 DIAGNOSIS — J690 Pneumonitis due to inhalation of food and vomit: Secondary | ICD-10-CM

## 2021-08-08 DIAGNOSIS — E1165 Type 2 diabetes mellitus with hyperglycemia: Secondary | ICD-10-CM | POA: Diagnosis present

## 2021-08-08 DIAGNOSIS — Z8674 Personal history of sudden cardiac arrest: Secondary | ICD-10-CM

## 2021-08-08 DIAGNOSIS — C109 Malignant neoplasm of oropharynx, unspecified: Secondary | ICD-10-CM | POA: Diagnosis present

## 2021-08-08 DIAGNOSIS — I5021 Acute systolic (congestive) heart failure: Secondary | ICD-10-CM | POA: Diagnosis present

## 2021-08-08 DIAGNOSIS — M6282 Rhabdomyolysis: Secondary | ICD-10-CM | POA: Diagnosis present

## 2021-08-08 DIAGNOSIS — G902 Horner's syndrome: Secondary | ICD-10-CM | POA: Diagnosis present

## 2021-08-08 DIAGNOSIS — Z923 Personal history of irradiation: Secondary | ICD-10-CM | POA: Diagnosis not present

## 2021-08-08 DIAGNOSIS — R29898 Other symptoms and signs involving the musculoskeletal system: Secondary | ICD-10-CM | POA: Diagnosis not present

## 2021-08-08 DIAGNOSIS — E039 Hypothyroidism, unspecified: Secondary | ICD-10-CM | POA: Diagnosis present

## 2021-08-08 DIAGNOSIS — Y842 Radiological procedure and radiotherapy as the cause of abnormal reaction of the patient, or of later complication, without mention of misadventure at the time of the procedure: Secondary | ICD-10-CM | POA: Diagnosis present

## 2021-08-08 DIAGNOSIS — I639 Cerebral infarction, unspecified: Secondary | ICD-10-CM

## 2021-08-08 DIAGNOSIS — E860 Dehydration: Secondary | ICD-10-CM | POA: Diagnosis present

## 2021-08-08 DIAGNOSIS — A419 Sepsis, unspecified organism: Secondary | ICD-10-CM | POA: Diagnosis present

## 2021-08-08 DIAGNOSIS — Z20822 Contact with and (suspected) exposure to covid-19: Secondary | ICD-10-CM | POA: Diagnosis present

## 2021-08-08 DIAGNOSIS — C760 Malignant neoplasm of head, face and neck: Secondary | ICD-10-CM

## 2021-08-08 DIAGNOSIS — Z931 Gastrostomy status: Secondary | ICD-10-CM

## 2021-08-08 DIAGNOSIS — G5601 Carpal tunnel syndrome, right upper limb: Secondary | ICD-10-CM | POA: Diagnosis present

## 2021-08-08 DIAGNOSIS — H02401 Unspecified ptosis of right eyelid: Secondary | ICD-10-CM | POA: Diagnosis present

## 2021-08-08 DIAGNOSIS — E861 Hypovolemia: Secondary | ICD-10-CM

## 2021-08-08 DIAGNOSIS — E871 Hypo-osmolality and hyponatremia: Secondary | ICD-10-CM | POA: Diagnosis present

## 2021-08-08 DIAGNOSIS — W1830XA Fall on same level, unspecified, initial encounter: Secondary | ICD-10-CM | POA: Diagnosis present

## 2021-08-08 DIAGNOSIS — Z79899 Other long term (current) drug therapy: Secondary | ICD-10-CM

## 2021-08-08 DIAGNOSIS — I9589 Other hypotension: Secondary | ICD-10-CM | POA: Diagnosis present

## 2021-08-08 DIAGNOSIS — C7951 Secondary malignant neoplasm of bone: Secondary | ICD-10-CM | POA: Diagnosis present

## 2021-08-08 DIAGNOSIS — J9601 Acute respiratory failure with hypoxia: Secondary | ICD-10-CM | POA: Diagnosis present

## 2021-08-08 DIAGNOSIS — C7801 Secondary malignant neoplasm of right lung: Secondary | ICD-10-CM | POA: Diagnosis present

## 2021-08-08 DIAGNOSIS — C799 Secondary malignant neoplasm of unspecified site: Secondary | ICD-10-CM | POA: Diagnosis not present

## 2021-08-08 DIAGNOSIS — R6520 Severe sepsis without septic shock: Secondary | ICD-10-CM | POA: Diagnosis present

## 2021-08-08 DIAGNOSIS — R4182 Altered mental status, unspecified: Secondary | ICD-10-CM

## 2021-08-08 DIAGNOSIS — R262 Difficulty in walking, not elsewhere classified: Secondary | ICD-10-CM | POA: Diagnosis present

## 2021-08-08 DIAGNOSIS — R0902 Hypoxemia: Secondary | ICD-10-CM

## 2021-08-08 DIAGNOSIS — I671 Cerebral aneurysm, nonruptured: Secondary | ICD-10-CM | POA: Diagnosis present

## 2021-08-08 DIAGNOSIS — Z79890 Hormone replacement therapy (postmenopausal): Secondary | ICD-10-CM

## 2021-08-08 DIAGNOSIS — S0083XA Contusion of other part of head, initial encounter: Secondary | ICD-10-CM | POA: Diagnosis present

## 2021-08-08 DIAGNOSIS — R1310 Dysphagia, unspecified: Secondary | ICD-10-CM | POA: Diagnosis present

## 2021-08-08 DIAGNOSIS — I6389 Other cerebral infarction: Secondary | ICD-10-CM | POA: Diagnosis not present

## 2021-08-08 DIAGNOSIS — Z7189 Other specified counseling: Secondary | ICD-10-CM | POA: Diagnosis not present

## 2021-08-08 DIAGNOSIS — Z8249 Family history of ischemic heart disease and other diseases of the circulatory system: Secondary | ICD-10-CM

## 2021-08-08 DIAGNOSIS — R609 Edema, unspecified: Secondary | ICD-10-CM

## 2021-08-08 LAB — CBC WITH DIFFERENTIAL/PLATELET
Abs Immature Granulocytes: 0.1 10*3/uL — ABNORMAL HIGH (ref 0.00–0.07)
Basophils Absolute: 0.1 10*3/uL (ref 0.0–0.1)
Basophils Relative: 0 %
Eosinophils Absolute: 0 10*3/uL (ref 0.0–0.5)
Eosinophils Relative: 0 %
HCT: 42.6 % (ref 39.0–52.0)
Hemoglobin: 14.6 g/dL (ref 13.0–17.0)
Immature Granulocytes: 1 %
Lymphocytes Relative: 2 %
Lymphs Abs: 0.4 10*3/uL — ABNORMAL LOW (ref 0.7–4.0)
MCH: 31.4 pg (ref 26.0–34.0)
MCHC: 34.3 g/dL (ref 30.0–36.0)
MCV: 91.6 fL (ref 80.0–100.0)
Monocytes Absolute: 1.5 10*3/uL — ABNORMAL HIGH (ref 0.1–1.0)
Monocytes Relative: 8 %
Neutro Abs: 17.7 10*3/uL — ABNORMAL HIGH (ref 1.7–7.7)
Neutrophils Relative %: 89 %
Platelets: 390 10*3/uL (ref 150–400)
RBC: 4.65 MIL/uL (ref 4.22–5.81)
RDW: 13.4 % (ref 11.5–15.5)
WBC: 19.8 10*3/uL — ABNORMAL HIGH (ref 4.0–10.5)
nRBC: 0 % (ref 0.0–0.2)

## 2021-08-08 LAB — TROPONIN I (HIGH SENSITIVITY)
Troponin I (High Sensitivity): 10 ng/L (ref <18)
Troponin I (High Sensitivity): 11 ng/L (ref <18)

## 2021-08-08 LAB — COMPREHENSIVE METABOLIC PANEL
ALT: 24 U/L (ref 0–44)
AST: 48 U/L — ABNORMAL HIGH (ref 15–41)
Albumin: 4 g/dL (ref 3.5–5.0)
Alkaline Phosphatase: 76 U/L (ref 38–126)
Anion gap: 8 (ref 5–15)
BUN: 35 mg/dL — ABNORMAL HIGH (ref 8–23)
CO2: 28 mmol/L (ref 22–32)
Calcium: 9.1 mg/dL (ref 8.9–10.3)
Chloride: 96 mmol/L — ABNORMAL LOW (ref 98–111)
Creatinine, Ser: 1.87 mg/dL — ABNORMAL HIGH (ref 0.61–1.24)
GFR, Estimated: 39 mL/min — ABNORMAL LOW (ref >60)
Glucose, Bld: 141 mg/dL — ABNORMAL HIGH (ref 70–99)
Potassium: 4.8 mmol/L (ref 3.5–5.1)
Sodium: 132 mmol/L — ABNORMAL LOW (ref 135–145)
Total Bilirubin: 0.9 mg/dL (ref 0.3–1.2)
Total Protein: 7.3 g/dL (ref 6.5–8.1)

## 2021-08-08 LAB — URINALYSIS, COMPLETE (UACMP) WITH MICROSCOPIC
Bilirubin Urine: NEGATIVE
Glucose, UA: NEGATIVE mg/dL
Ketones, ur: NEGATIVE mg/dL
Leukocytes,Ua: NEGATIVE
Nitrite: NEGATIVE
Protein, ur: 100 mg/dL — AB
Specific Gravity, Urine: 1.02 (ref 1.005–1.030)
Squamous Epithelial / HPF: NONE SEEN (ref 0–5)
WBC, UA: NONE SEEN WBC/hpf (ref 0–5)
pH: 6 (ref 5.0–8.0)

## 2021-08-08 LAB — BASIC METABOLIC PANEL
Anion gap: 7 (ref 5–15)
BUN: 28 mg/dL — ABNORMAL HIGH (ref 8–23)
CO2: 24 mmol/L (ref 22–32)
Calcium: 8 mg/dL — ABNORMAL LOW (ref 8.9–10.3)
Chloride: 95 mmol/L — ABNORMAL LOW (ref 98–111)
Creatinine, Ser: 1.44 mg/dL — ABNORMAL HIGH (ref 0.61–1.24)
GFR, Estimated: 54 mL/min — ABNORMAL LOW (ref >60)
Glucose, Bld: 272 mg/dL — ABNORMAL HIGH (ref 70–99)
Potassium: 4.4 mmol/L (ref 3.5–5.1)
Sodium: 126 mmol/L — ABNORMAL LOW (ref 135–145)

## 2021-08-08 LAB — BLOOD GAS, VENOUS
Acid-Base Excess: 0.9 mmol/L (ref 0.0–2.0)
Bicarbonate: 27.1 mmol/L (ref 20.0–28.0)
FIO2: 0.21
O2 Saturation: 81.6 %
Patient temperature: 37
pCO2, Ven: 48 mmHg (ref 44.0–60.0)
pH, Ven: 7.36 (ref 7.250–7.430)
pO2, Ven: 48 mmHg — ABNORMAL HIGH (ref 32.0–45.0)

## 2021-08-08 LAB — URINE DRUG SCREEN, QUALITATIVE (ARMC ONLY)
Amphetamines, Ur Screen: NOT DETECTED
Barbiturates, Ur Screen: NOT DETECTED
Benzodiazepine, Ur Scrn: NOT DETECTED
Cannabinoid 50 Ng, Ur ~~LOC~~: NOT DETECTED
Cocaine Metabolite,Ur ~~LOC~~: NOT DETECTED
MDMA (Ecstasy)Ur Screen: NOT DETECTED
Methadone Scn, Ur: NOT DETECTED
Opiate, Ur Screen: POSITIVE — AB
Phencyclidine (PCP) Ur S: NOT DETECTED
Tricyclic, Ur Screen: NOT DETECTED

## 2021-08-08 LAB — RESP PANEL BY RT-PCR (FLU A&B, COVID) ARPGX2
Influenza A by PCR: NEGATIVE
Influenza B by PCR: NEGATIVE
SARS Coronavirus 2 by RT PCR: NEGATIVE

## 2021-08-08 LAB — CK: Total CK: 1739 U/L — ABNORMAL HIGH (ref 49–397)

## 2021-08-08 LAB — LACTIC ACID, PLASMA
Lactic Acid, Venous: 2.5 mmol/L (ref 0.5–1.9)
Lactic Acid, Venous: 1.7 mmol/L (ref 0.5–1.9)

## 2021-08-08 LAB — GLUCOSE, CAPILLARY
Glucose-Capillary: 128 mg/dL — ABNORMAL HIGH (ref 70–99)
Glucose-Capillary: 108 mg/dL — ABNORMAL HIGH (ref 70–99)

## 2021-08-08 LAB — LIPASE, BLOOD: Lipase: 101 U/L — ABNORMAL HIGH (ref 11–51)

## 2021-08-08 LAB — D-DIMER, QUANTITATIVE: D-Dimer, Quant: 1.09 ug/mL-FEU — ABNORMAL HIGH (ref 0.00–0.50)

## 2021-08-08 LAB — BRAIN NATRIURETIC PEPTIDE: B Natriuretic Peptide: 39 pg/mL (ref 0.0–100.0)

## 2021-08-08 IMAGING — DX DG CHEST 1V PORT
1 series · 2 of 2 positions shown · non-contrast
Comparison: None

CLINICAL DATA: Low O2 saturation

EXAM:
PORTABLE CHEST 1 VIEW

[Series 1: chest ap · 0.14mm/px · 2 of 2 slices shown]
[im 1/2]
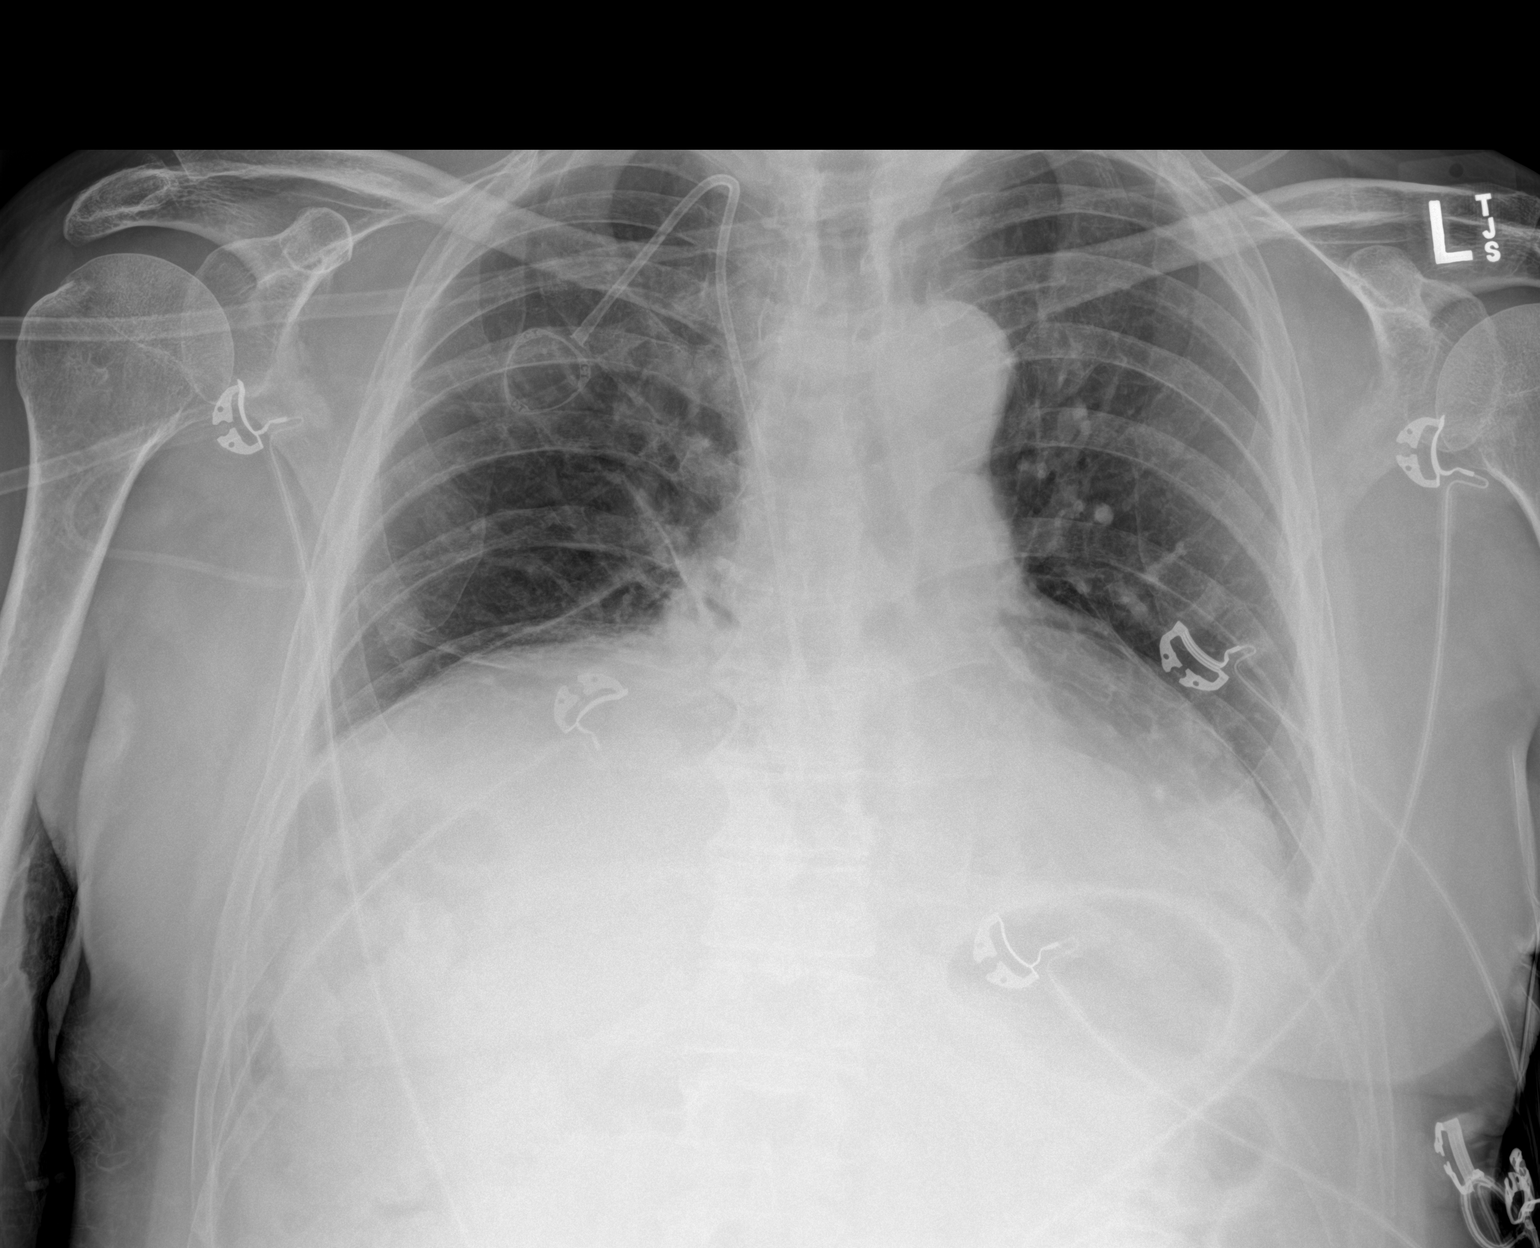
[im 2/2]
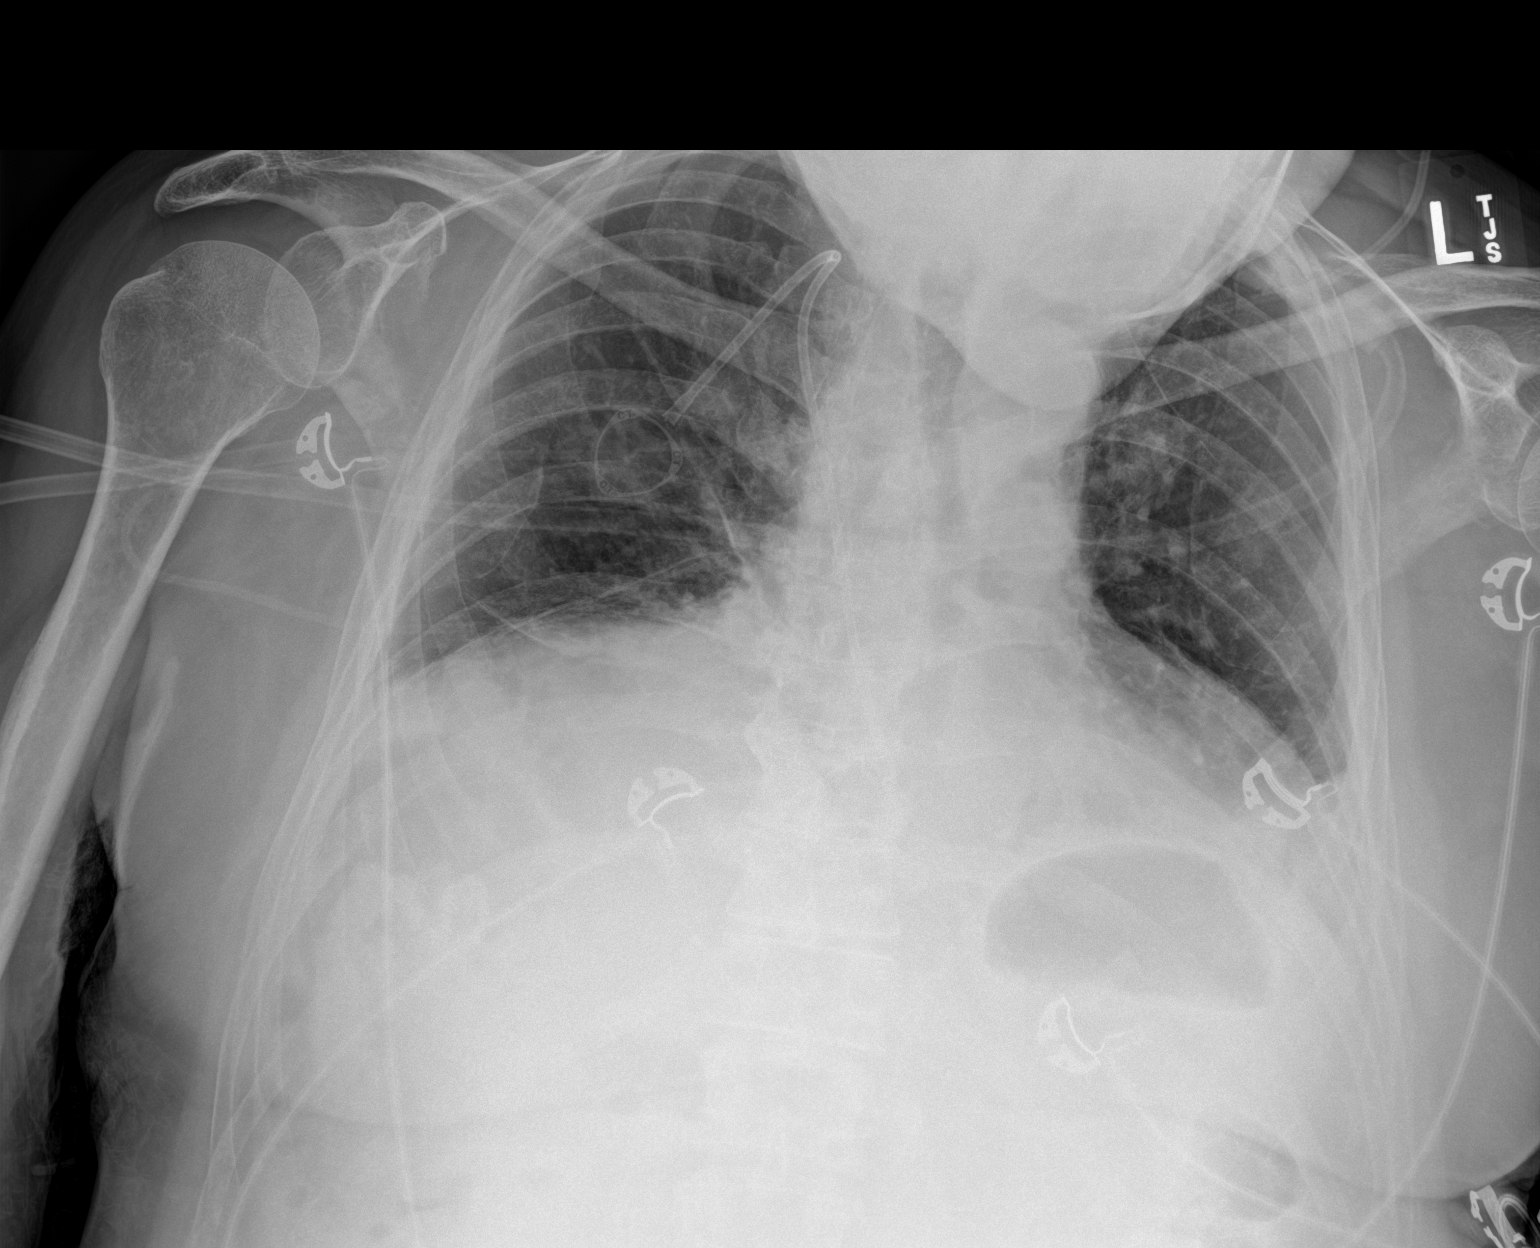

[2 of 2 positions shown; findings below may reference images not displayed]

FINDINGS: There is a chest port with catheter tip overlying the right atrium.
Unchanged, enlarged cardiac silhouette. There are low lung volumes
with bibasilar atelectasis and possible small effusions. Nodular
opacities in the right lung consistent with known pulmonary nodules.
No new focal airspace disease. No visible pneumothorax. Bilateral
shoulder degenerative changes. No acute osseous abnormality.
Thoracic spondylosis.
IMPRESSION: Low lung volumes with bibasilar subsegmental atelectasis and
probable small bilateral pleural effusions.

## 2021-08-08 IMAGING — CT CT ABD-PELV W/ CM
2 of 5 series · 16 of 46 positions shown, 18 images · IV contrast (APPLIED)
Comparison: CT of the abdomen without contrast on [DATE] and
PET scan on [DATE]

CLINICAL DATA: History of metastatic oropharyngeal squamous
carcinoma. Found on floor at home and hypoxic. Elevated white blood
cell count, D-dimer and lipase.

EXAM:
CT ABDOMEN AND PELVIS WITH CONTRAST
TECHNIQUE: Multidetector CT imaging of the abdomen and pelvis was performed
using the standard protocol following bolus administration of
intravenous contrast.
CONTRAST:  60mL OMNIPAQUE IOHEXOL 350 MG/ML SOLN

[Series 3: axial st · axial · 0.87mm/px · z∈[-888,-388]mm · 13 of 114 slices shown, 15 images]
[im 7/114  soft-tissue]
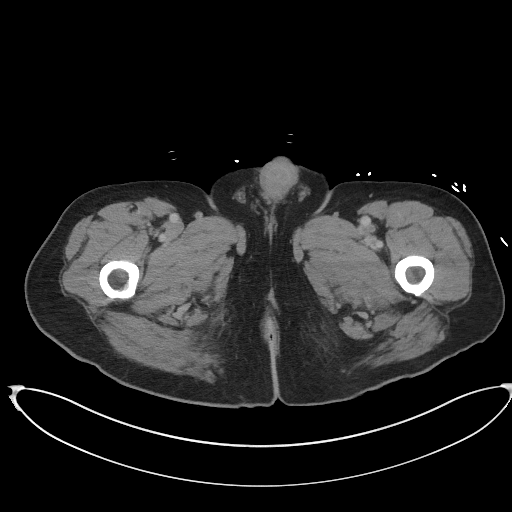
[im 7/114  bone]
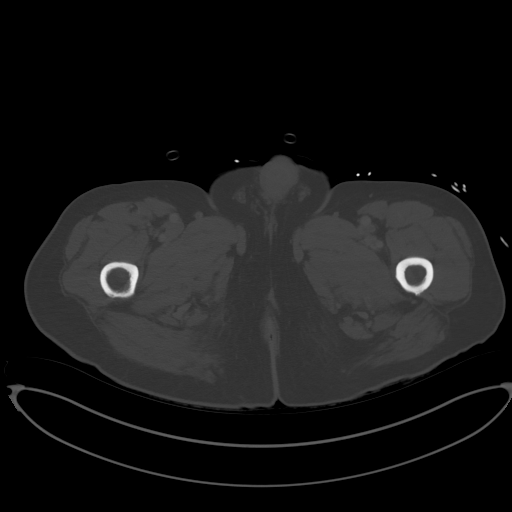
[im 13/114  soft-tissue]
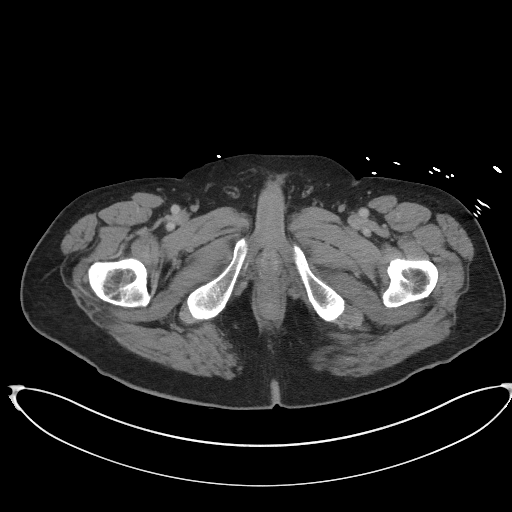
[im 26/114  soft-tissue]
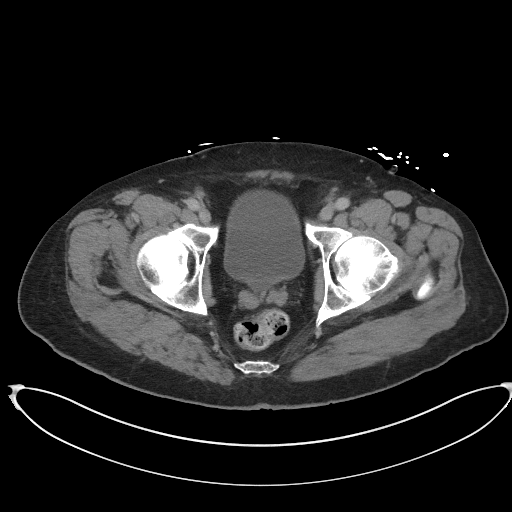
[im 32/114  soft-tissue]
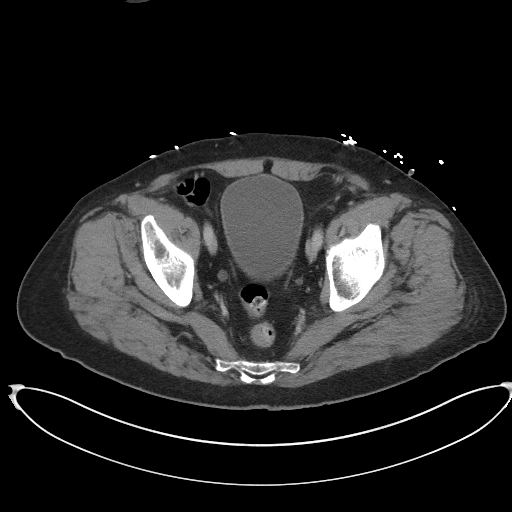
[im 38/114  soft-tissue]
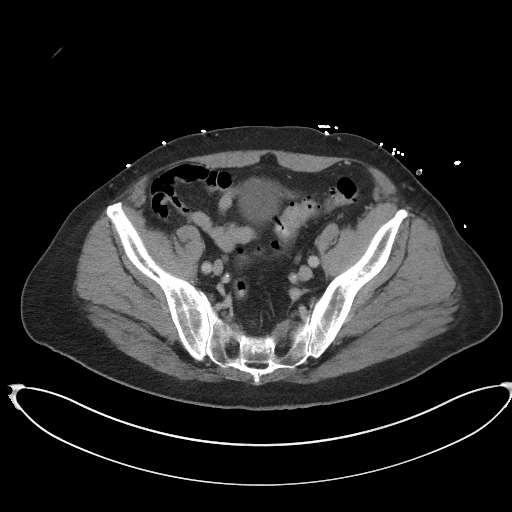
[im 51/114  soft-tissue]
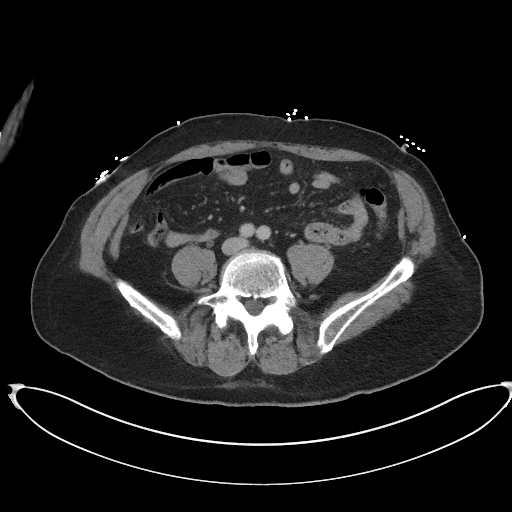
[im 57/114  soft-tissue]
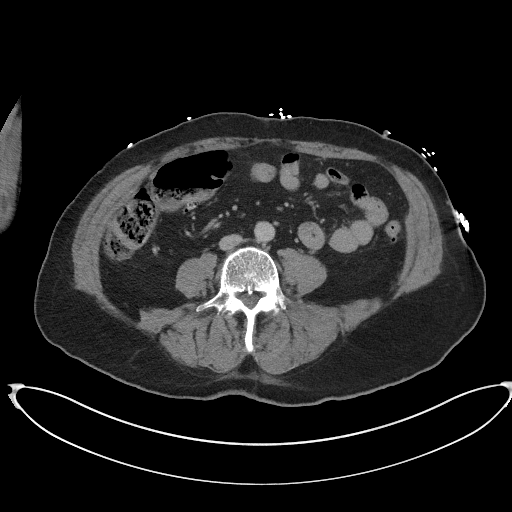
[im 63/114  soft-tissue]
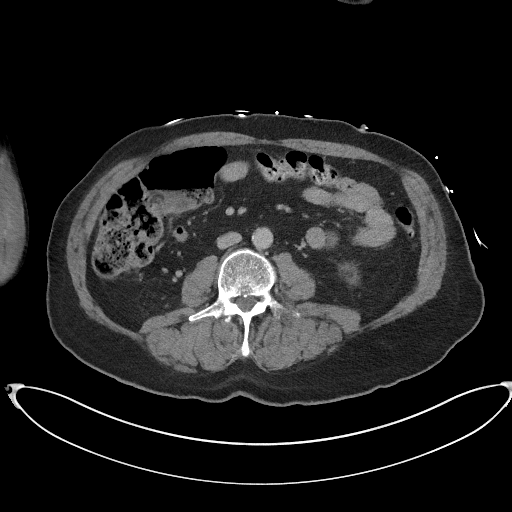
[im 76/114  soft-tissue]
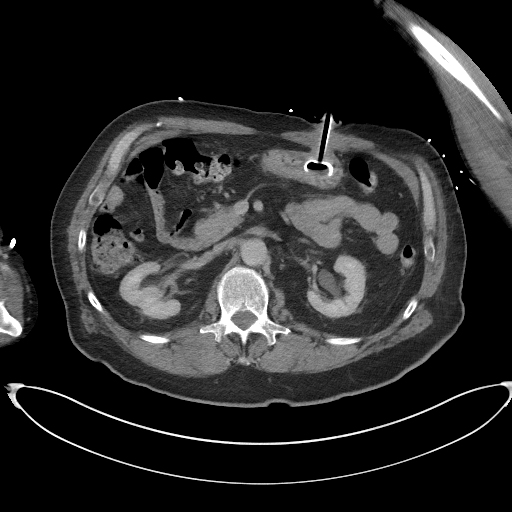
[im 76/114  bone]
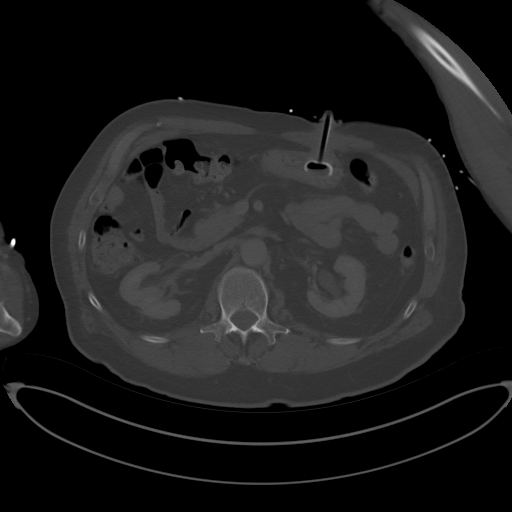
[im 82/114  soft-tissue]
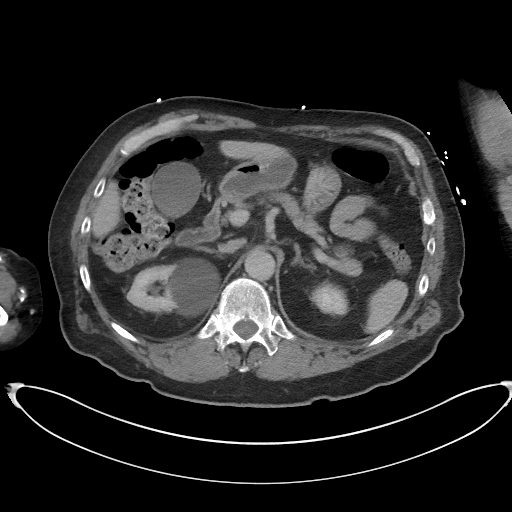
[im 88/114  soft-tissue]
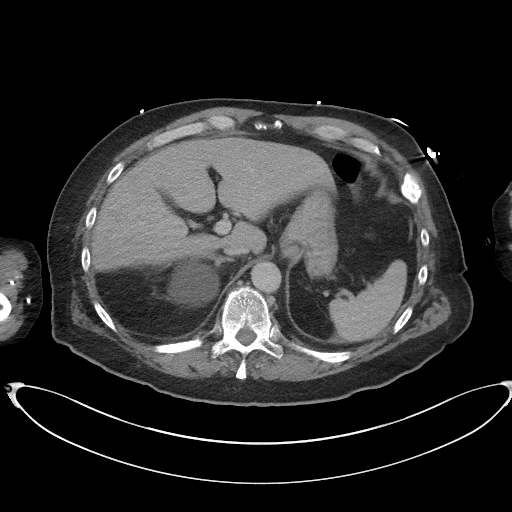
[im 101/114  soft-tissue]
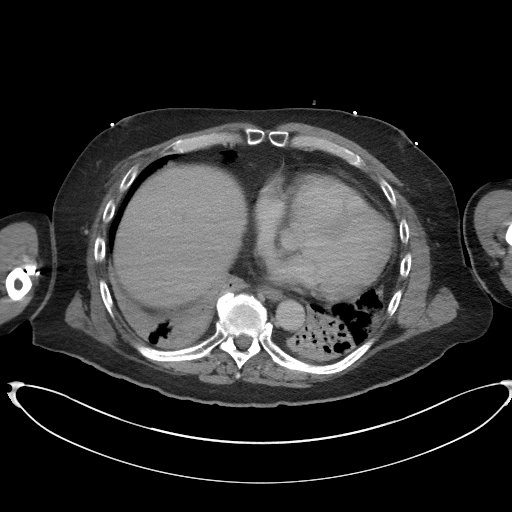
[im 107/114  soft-tissue]
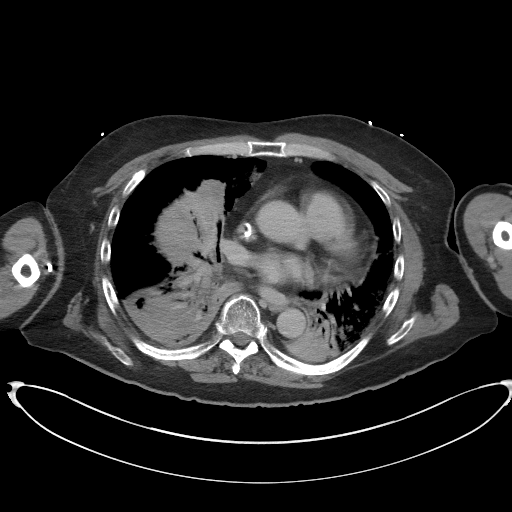

[Series 6: coronal st · coronal · 0.87mm/px · 3 of 92 slices shown]
[im 31/92  soft-tissue]
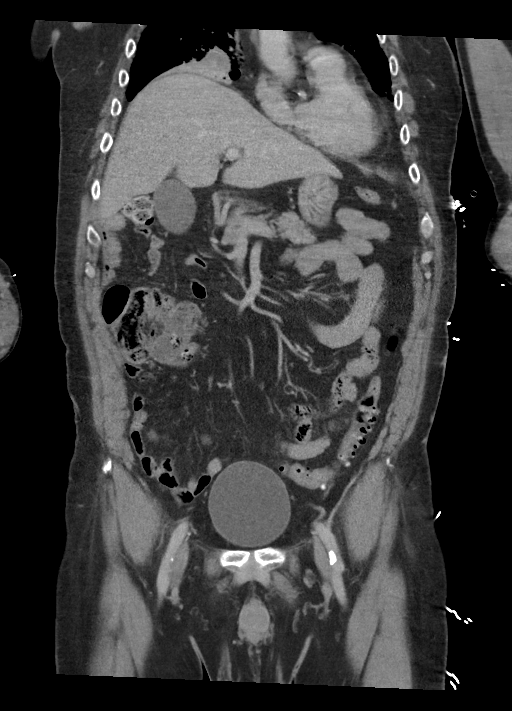
[im 41/92  soft-tissue]
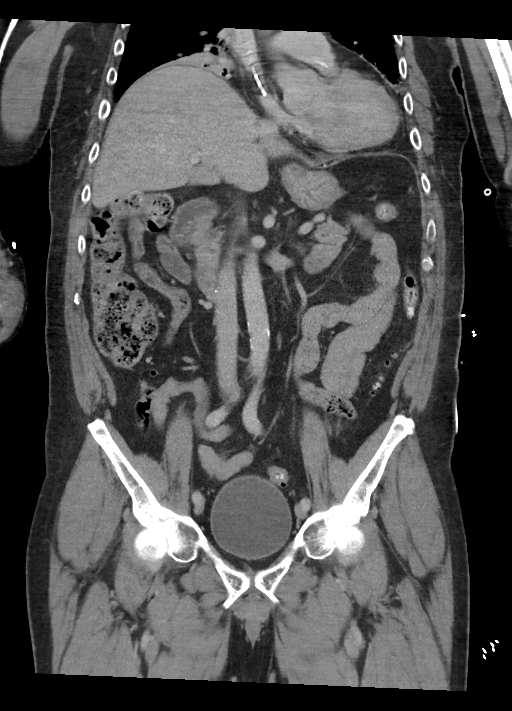
[im 51/92  soft-tissue]
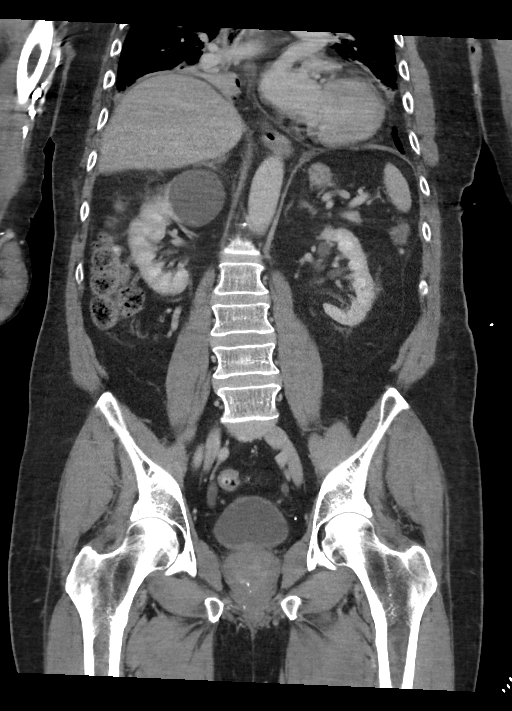

[16 of 46 positions shown; findings below may reference images not displayed]

FINDINGS: Hepatobiliary: Stable left lobe hepatic cyst. Gallbladder and bile
ducts are unremarkable.

Pancreas: Unremarkable. No pancreatic ductal dilatation or
surrounding inflammatory changes.

Spleen: Normal in size without focal abnormality.

Adrenals/Urinary Tract: Stable right renal cyst. No hydronephrosis
or calculi. No solid renal masses or adrenal masses. The bladder is
unremarkable.

Stomach/Bowel: Gastrostomy tube present with normal positioning
within the lumen of the stomach. No evidence of bowel obstruction,
ileus or inflammation. No mass lesions are seen involving the bowel.
Diverticulosis of the descending and sigmoid colon without evidence
of diverticulitis. No free intraperitoneal air.

Vascular/Lymphatic: No significant vascular findings are present. No
enlarged abdominal or pelvic lymph nodes.

Reproductive: Prostate is unremarkable.

Other: No abdominal wall hernia or abnormality. No abdominopelvic
ascites.

Musculoskeletal: No acute or significant osseous findings.
IMPRESSION: No acute findings in the abdomen or pelvis. Gastrostomy tube is
present and in appropriate position.

## 2021-08-08 IMAGING — CT CT CERVICAL SPINE W/O CM
3 of 4 series · 9 of 33 positions shown, 11 images · non-contrast
Comparison: MRI had without and with contrast [DATE]

CLINICAL DATA: Found on floor by family. Personal history of
esophageal cancer. Feeding tube. Mental status change. Hypoxia.

EXAM:
CT HEAD WITHOUT CONTRAST
CT CERVICAL SPINE WITHOUT CONTRAST
TECHNIQUE: Multidetector CT imaging of the head and cervical spine was
performed following the standard protocol without intravenous
contrast. Multiplanar CT image reconstructions of the cervical spine
were also generated.

[Series 6: sagittal bone · sagittal · 0.25mm/px · 5 of 57 slices shown, 6 images]
[im 19/57  bone]
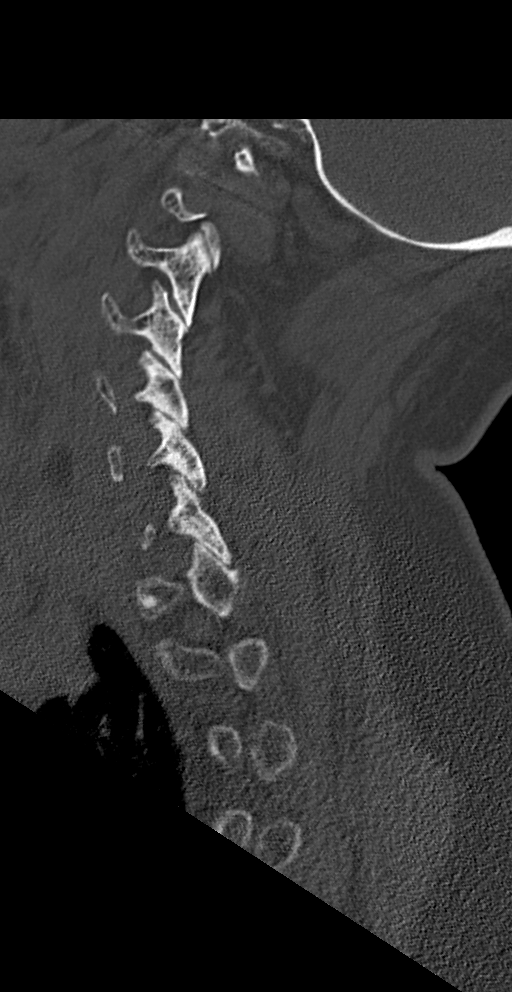
[im 24/57  bone]
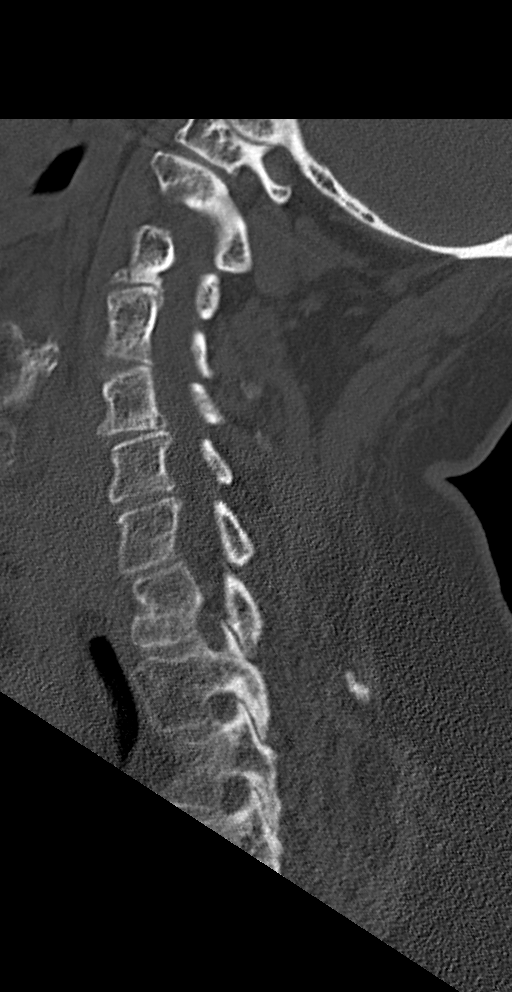
[im 29/57  soft-tissue]
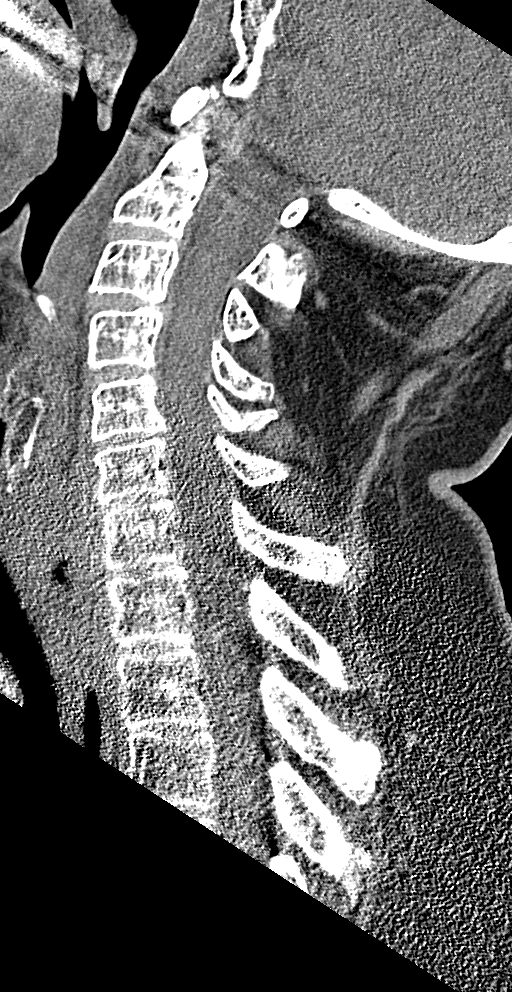
[im 29/57  bone]
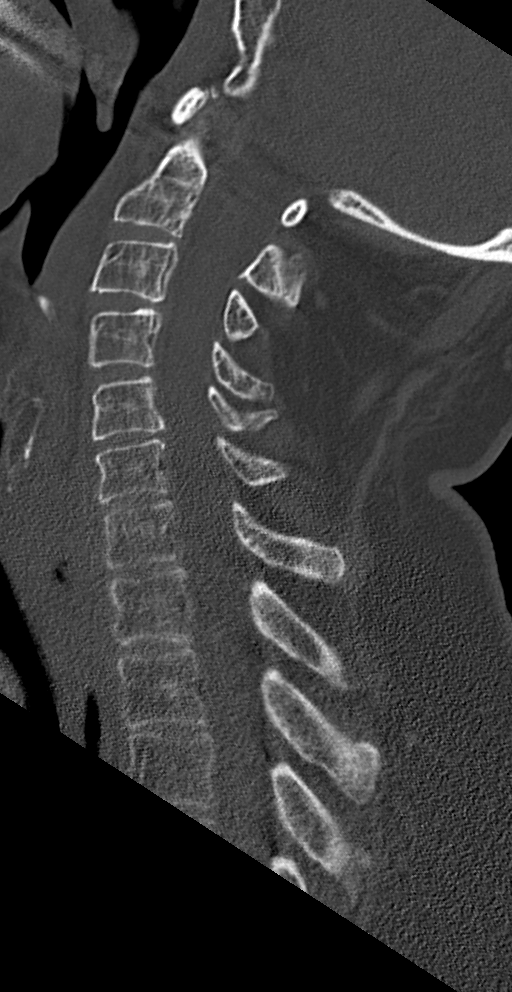
[im 33/57  bone]
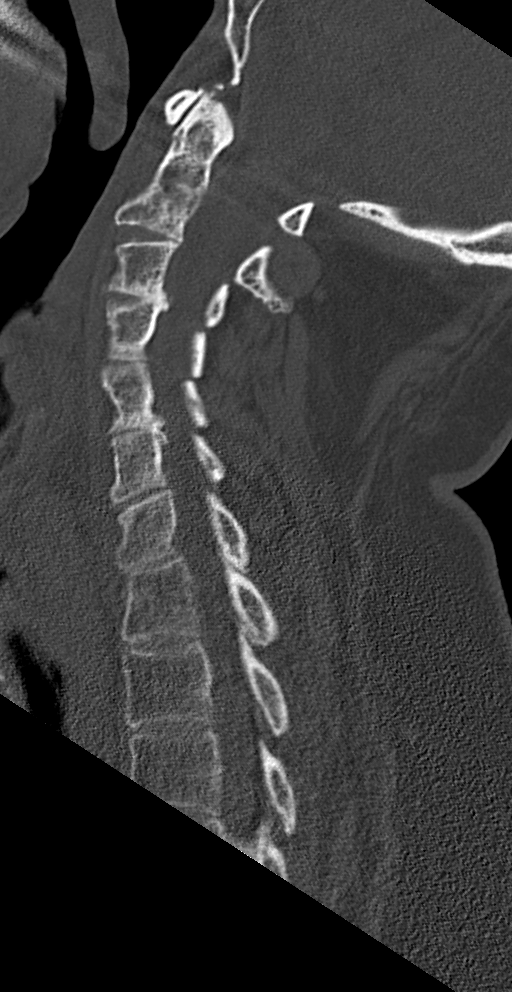
[im 38/57  bone]
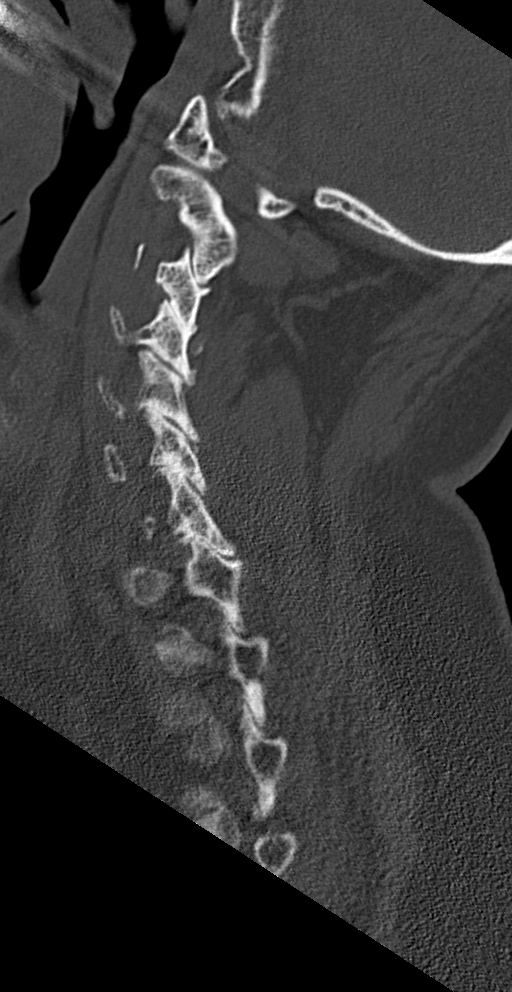

[Series 7: coronal bone · coronal · 0.28mm/px · 3 of 65 slices shown]
[im 19/65  bone]
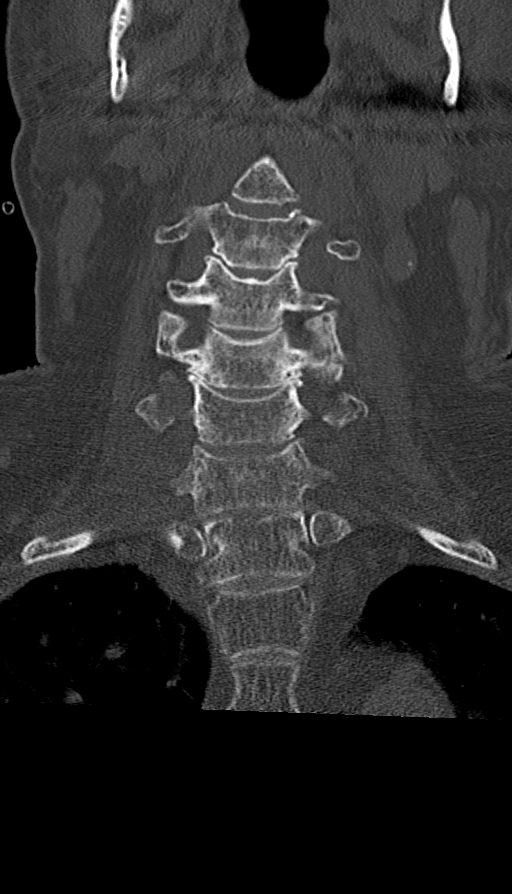
[im 28/65  bone]
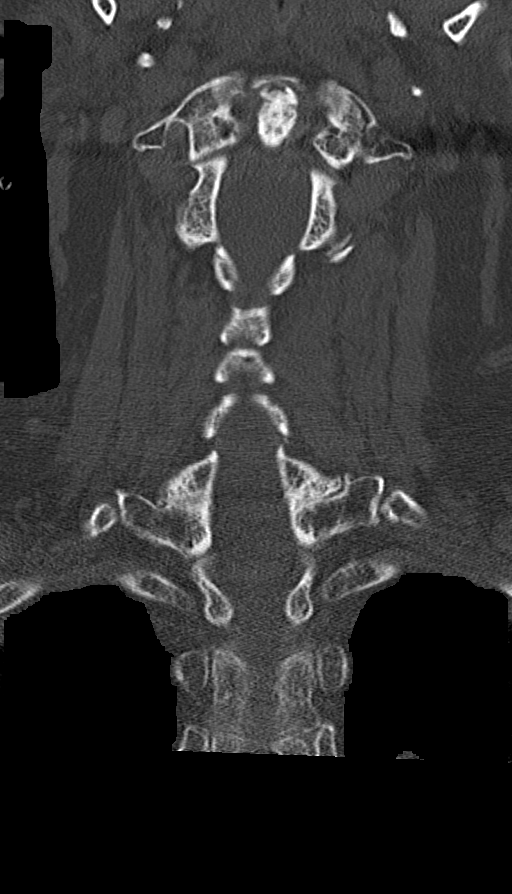
[im 37/65  bone]
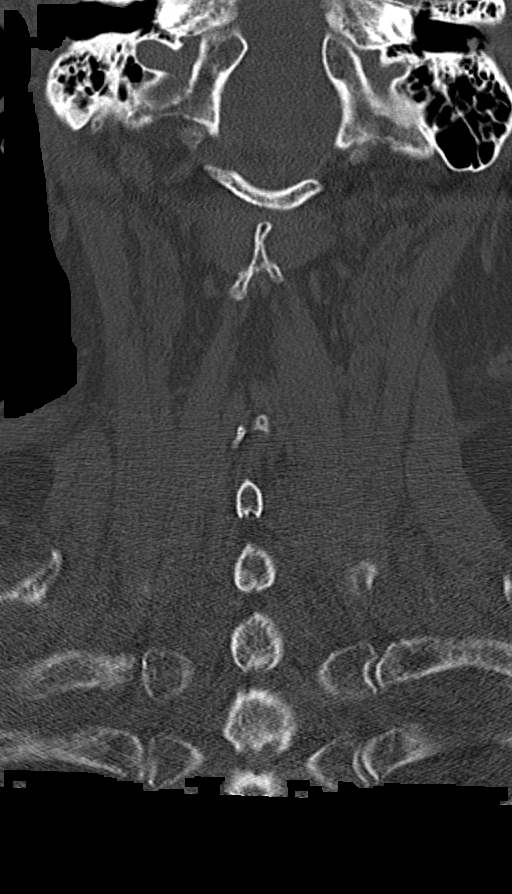

[Series 8: orthogonal bone · axial · 0.24mm/px · z∈[-256,-256]mm · 1 of 125 slices shown, 2 images]
[im 71/125  soft-tissue]
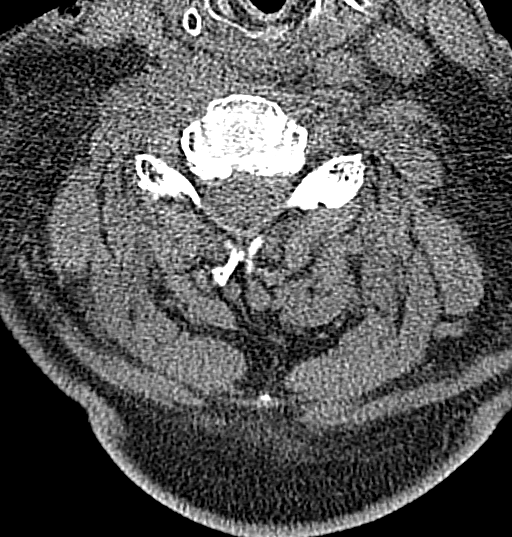
[im 71/125  bone]
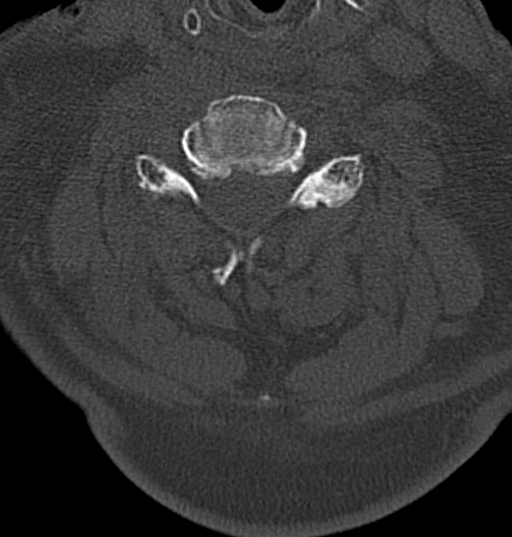

[9 of 33 positions shown; findings below may reference images not displayed]

FINDINGS: CT HEAD FINDINGS

Brain: No acute infarct, hemorrhage, or mass lesion is present. No
significant white matter lesions are present. The ventricles are of
normal size. No significant extraaxial fluid collection is present.
The brainstem and cerebellum are within normal limits.

Vascular: No hyperdense vessel or unexpected calcification.

Skull: Right supraorbital soft tissue swelling and hematoma is
present. No underlying fracture is present. Calvarium otherwise
within normal limits. No other significant extracranial soft tissue
injury is present.

Sinuses/Orbits: Mild mucosal thickening is present in the maxillary
sinuses bilaterally. No air-fluid levels are present. The globes and
orbits are within normal limits.

CT CERVICAL SPINE FINDINGS

Alignment: No significant listhesis is present. Cervical lordosis
preserved.

Skull base and vertebrae: Craniocervical junction is within normal
limits. Vertebral body heights are normal. Narrow acute or healing
fractures are present.

Soft tissues and spinal canal: No prevertebral fluid or swelling. No
visible canal hematoma. Esophageal mass the thoracic inlet again
noted. Edema along the right vocal cord improved. Right IJ
Port-A-Cath in place.

Disc levels: Uncovertebral spurring leads to moderate foraminal
narrowing at C3-4 and C5-6, left greater than right.

Upper chest: Lung apices are clear.
IMPRESSION: 1. Right supraorbital soft tissue swelling and hematoma without
underlying fracture.
2. Normal CT appearance of the brain.
3. No acute fracture or traumatic subluxation in the cervical spine.
4. Multilevel degenerative changes of the cervical spine as
described.

## 2021-08-08 IMAGING — CT CT HEAD W/O CM
3 series · 14 of 47 positions shown, 16 images · non-contrast
Comparison: MRI had without and with contrast [DATE]

CLINICAL DATA: Found on floor by family. Personal history of
esophageal cancer. Feeding tube. Mental status change. Hypoxia.

EXAM:
CT HEAD WITHOUT CONTRAST
CT CERVICAL SPINE WITHOUT CONTRAST
TECHNIQUE: Multidetector CT imaging of the head and cervical spine was
performed following the standard protocol without intravenous
contrast. Multiplanar CT image reconstructions of the cervical spine
were also generated.

[Series 3: head wo · axial · 0.46mm/px · z∈[-137,+3]mm · 8 of 34 slices shown, 10 images]
[im 3/34  brain]
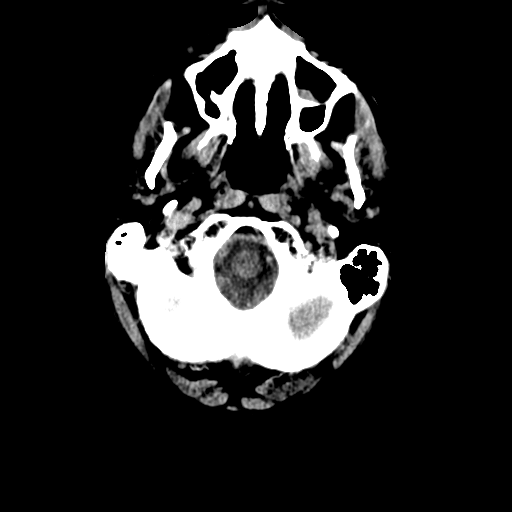
[im 3/34  bone]
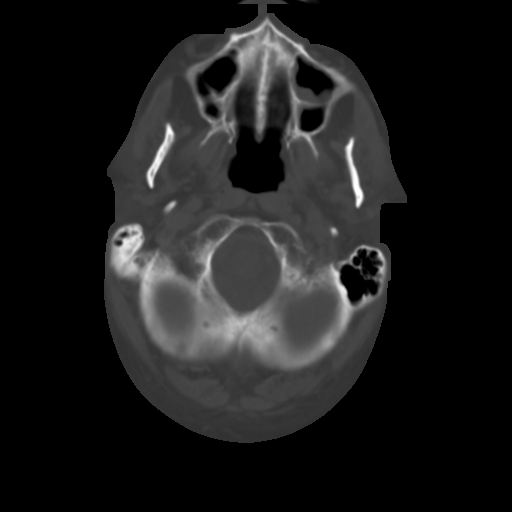
[im 7/34  brain]
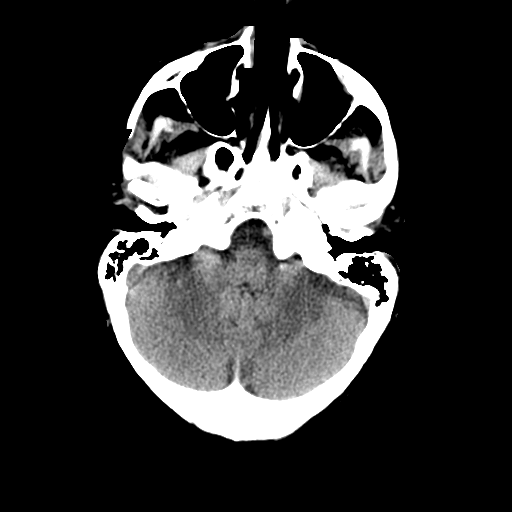
[im 11/34  brain]
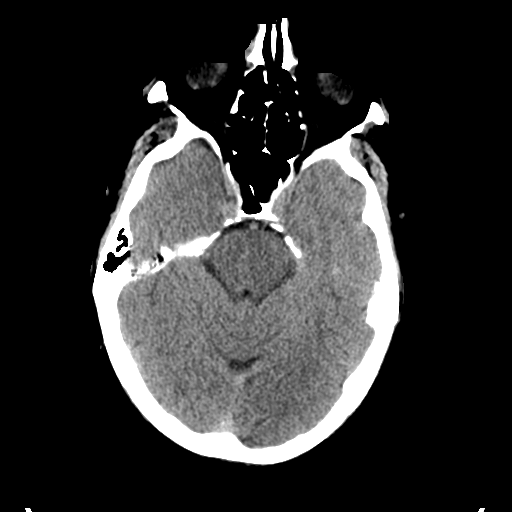
[im 15/34  brain]
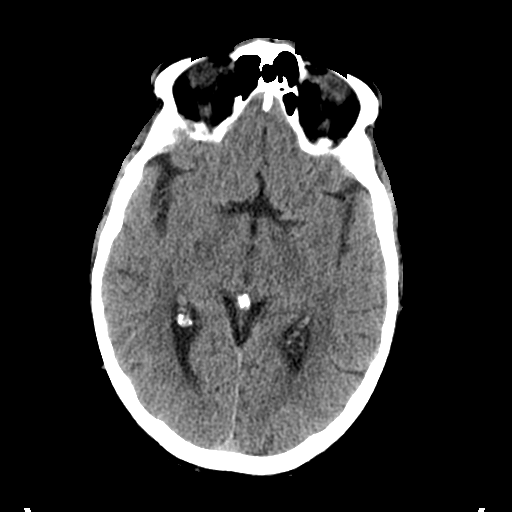
[im 19/34  brain]
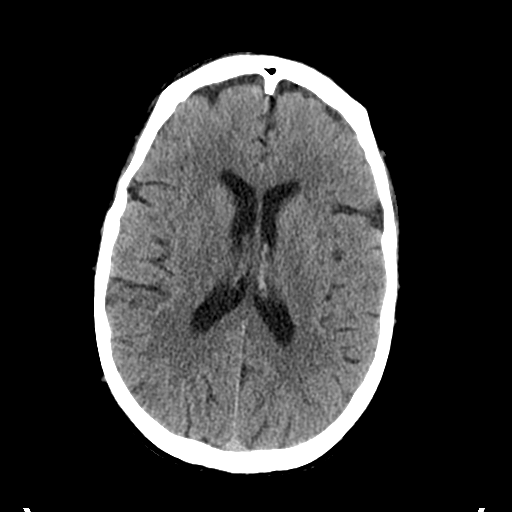
[im 19/34  bone]
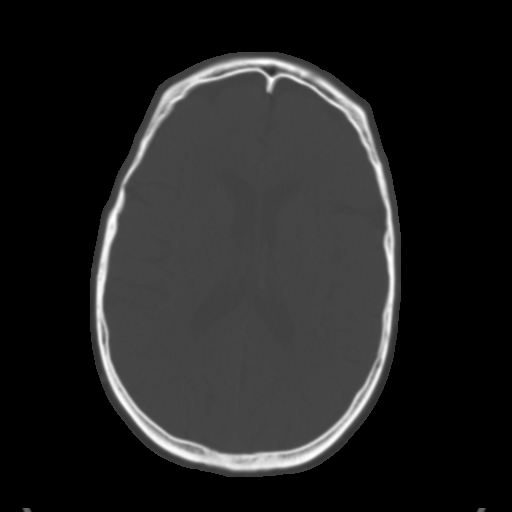
[im 23/34  brain]
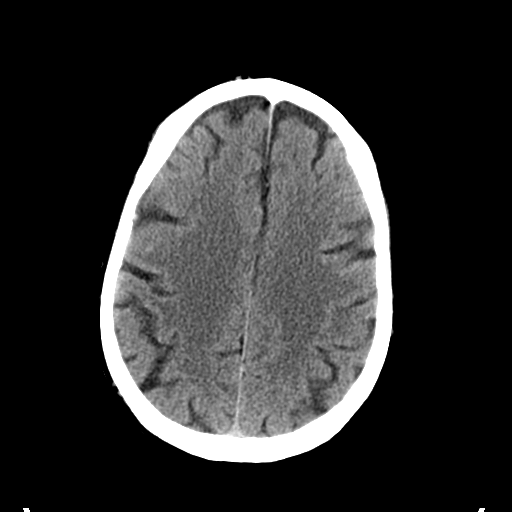
[im 27/34  brain]
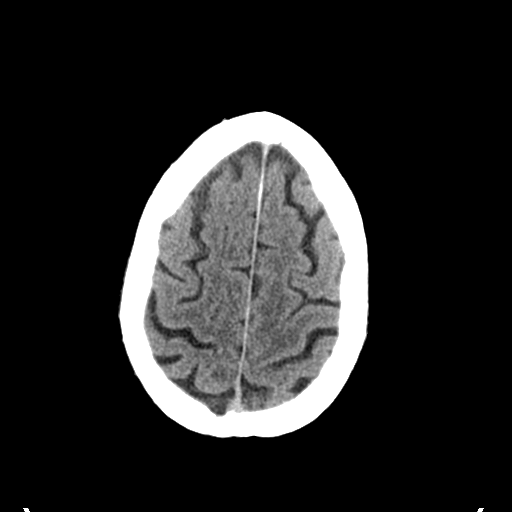
[im 31/34  brain]
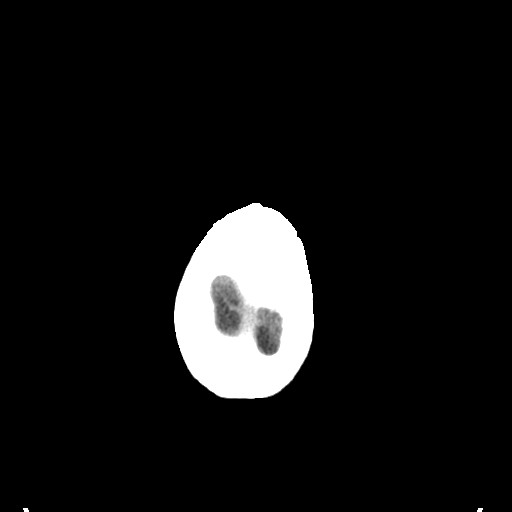

[Series 4: coronal soft tissue · coronal · 0.35mm/px · 3 of 81 slices shown]
[im 27/81  brain]
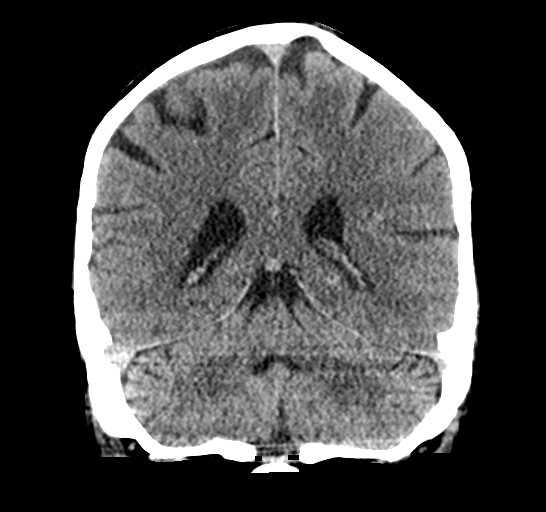
[im 36/81  brain]
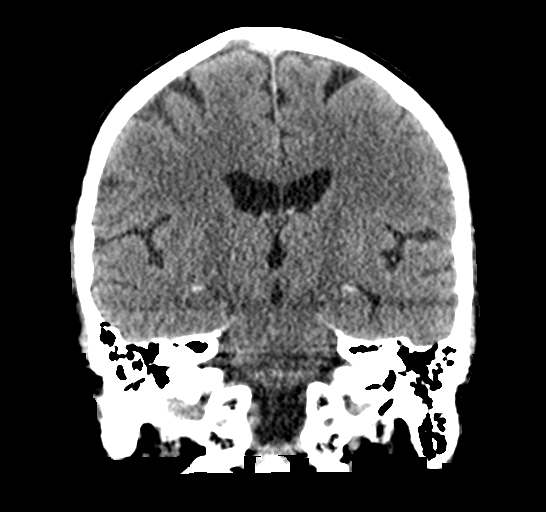
[im 45/81  brain]
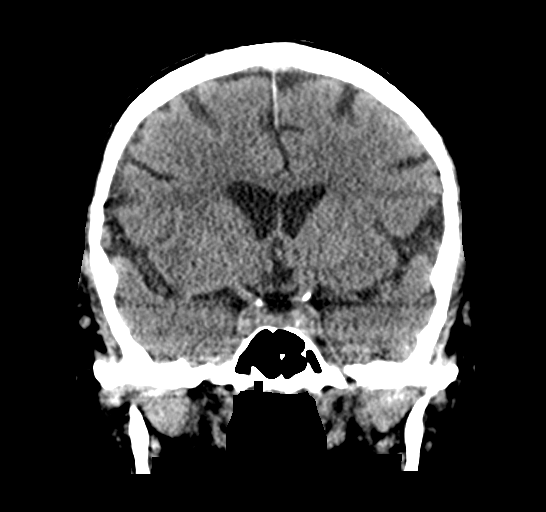

[Series 5: sagittal soft tissue · sagittal · 0.35mm/px · 3 of 54 slices shown]
[im 18/54  brain]
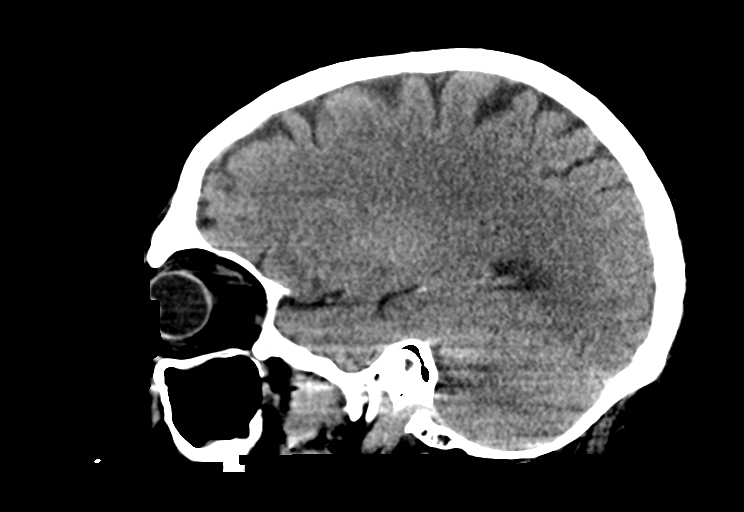
[im 27/54  brain]
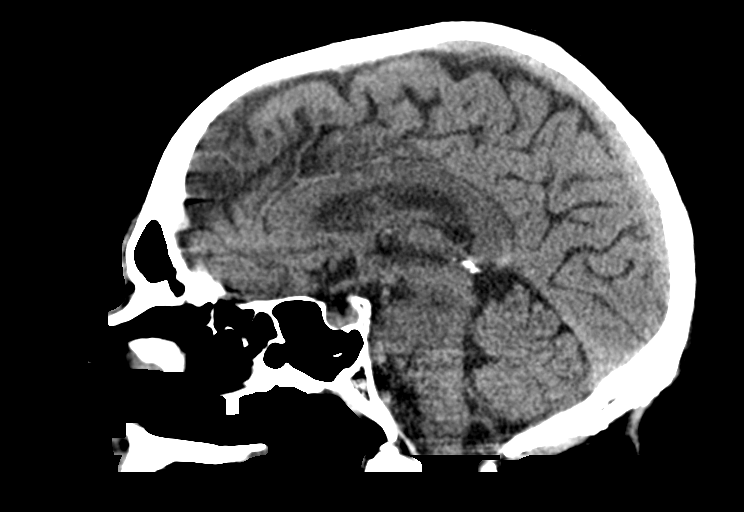
[im 36/54  brain]
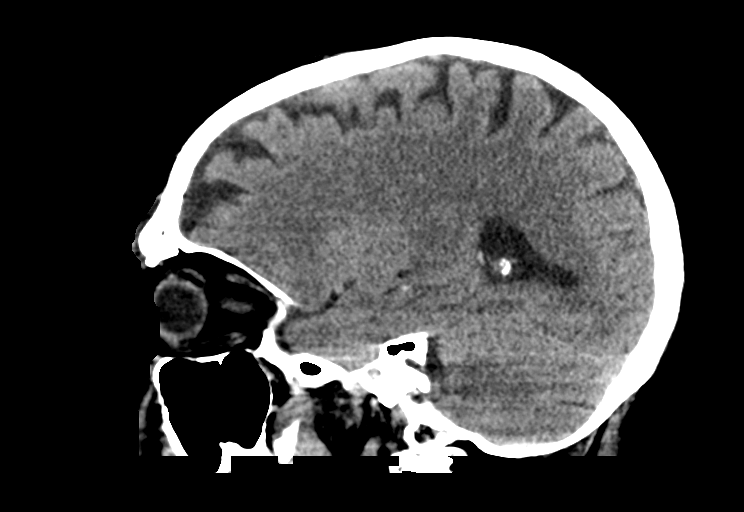

[14 of 47 positions shown; findings below may reference images not displayed]

FINDINGS: CT HEAD FINDINGS

Brain: No acute infarct, hemorrhage, or mass lesion is present. No
significant white matter lesions are present. The ventricles are of
normal size. No significant extraaxial fluid collection is present.
The brainstem and cerebellum are within normal limits.

Vascular: No hyperdense vessel or unexpected calcification.

Skull: Right supraorbital soft tissue swelling and hematoma is
present. No underlying fracture is present. Calvarium otherwise
within normal limits. No other significant extracranial soft tissue
injury is present.

Sinuses/Orbits: Mild mucosal thickening is present in the maxillary
sinuses bilaterally. No air-fluid levels are present. The globes and
orbits are within normal limits.

CT CERVICAL SPINE FINDINGS

Alignment: No significant listhesis is present. Cervical lordosis
preserved.

Skull base and vertebrae: Craniocervical junction is within normal
limits. Vertebral body heights are normal. Narrow acute or healing
fractures are present.

Soft tissues and spinal canal: No prevertebral fluid or swelling. No
visible canal hematoma. Esophageal mass the thoracic inlet again
noted. Edema along the right vocal cord improved. Right IJ
Port-A-Cath in place.

Disc levels: Uncovertebral spurring leads to moderate foraminal
narrowing at C3-4 and C5-6, left greater than right.

Upper chest: Lung apices are clear.
IMPRESSION: 1. Right supraorbital soft tissue swelling and hematoma without
underlying fracture.
2. Normal CT appearance of the brain.
3. No acute fracture or traumatic subluxation in the cervical spine.
4. Multilevel degenerative changes of the cervical spine as
described.

## 2021-08-08 IMAGING — CT CT ANGIO CHEST
2 of 6 series · 16 of 46 positions shown · IV contrast (APPLIED)
Comparison: Prior CT of the chest on [DATE]

CLINICAL DATA: History metastatic or pharyngeal squamous carcinoma.
Found on floor at home and hypoxic. Elevated white blood cell count,
D-dimer and lipase.

EXAM:
CT ANGIOGRAPHY CHEST WITH CONTRAST
TECHNIQUE: Multidetector CT imaging of the chest was performed using the
standard protocol during bolus administration of intravenous
contrast. Multiplanar CT image reconstructions and MIPs were
obtained to evaluate the vascular anatomy.
CONTRAST:  60mL OMNIPAQUE IOHEXOL 350 MG/ML SOLN

[Series 6: thins · axial · 0.76mm/px · z∈[-507,-255]mm · 13 of 277 slices shown]
[im 13/277  lung]
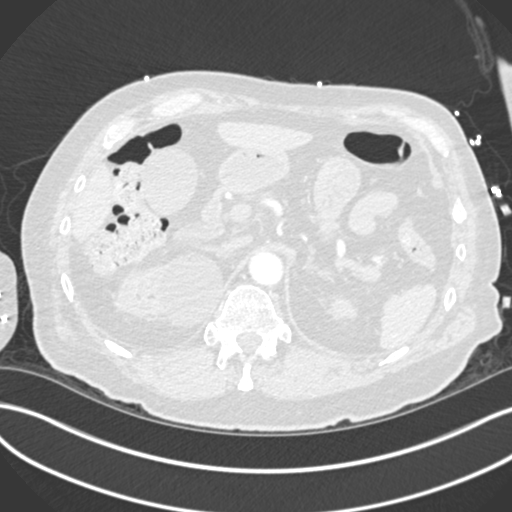
[im 37/277  soft-tissue]
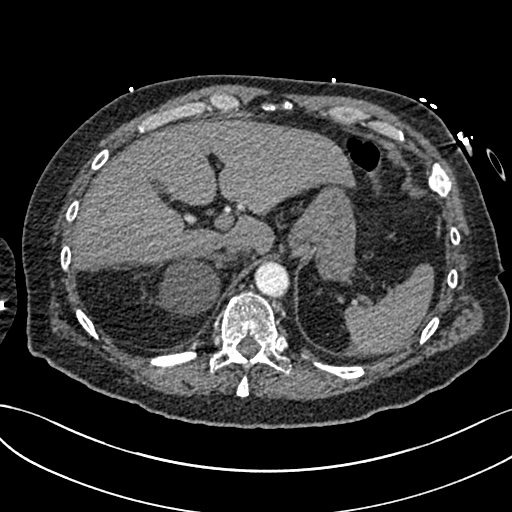
[im 61/277  lung]
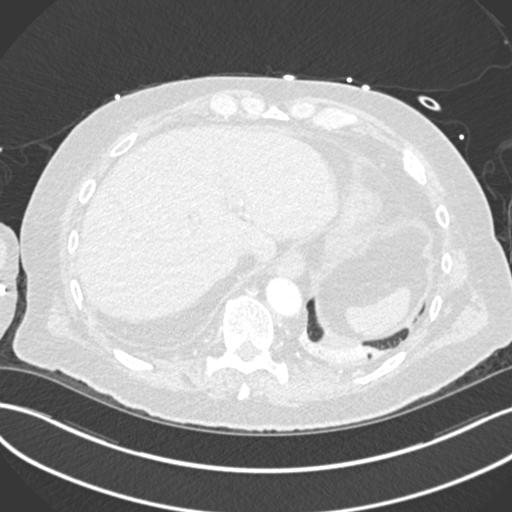
[im 73/277  soft-tissue]
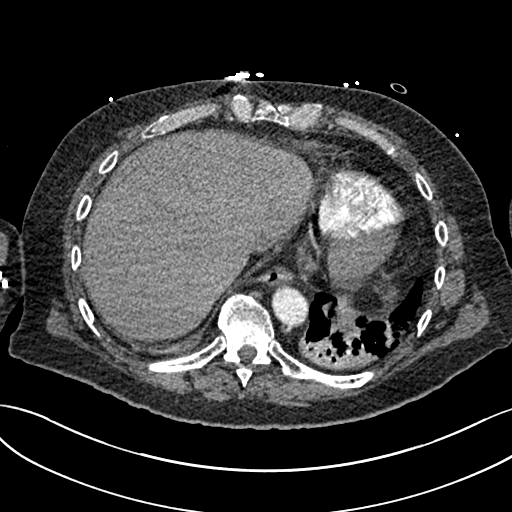
[im 97/277  lung]
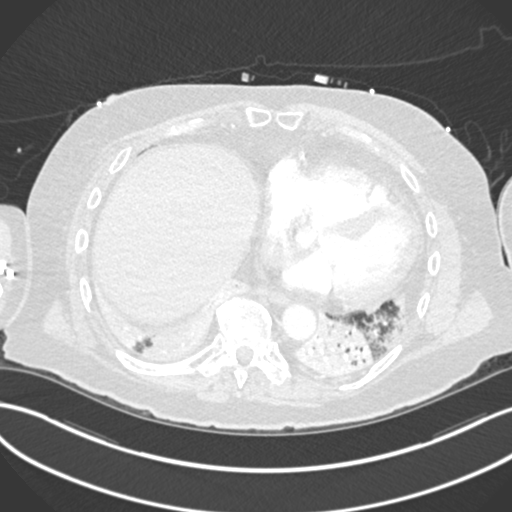
[im 121/277  soft-tissue]
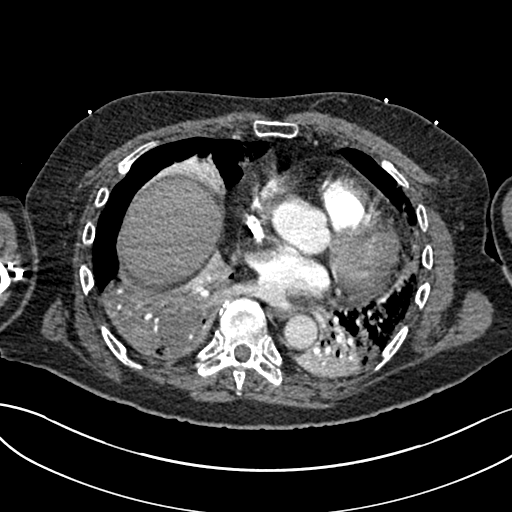
[im 145/277  lung]
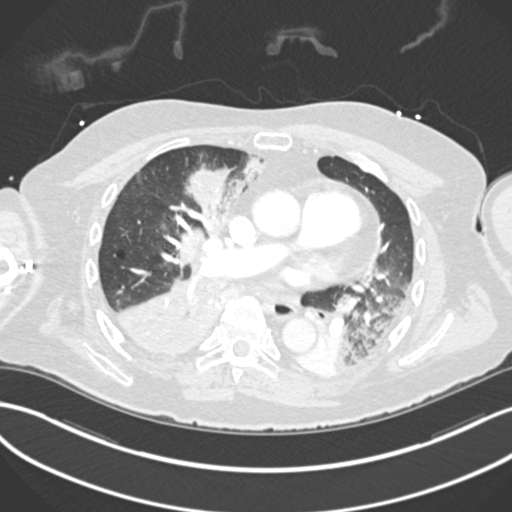
[im 157/277  soft-tissue]
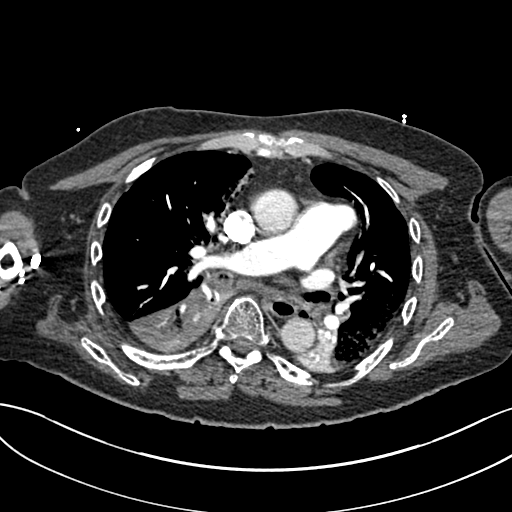
[im 181/277  lung]
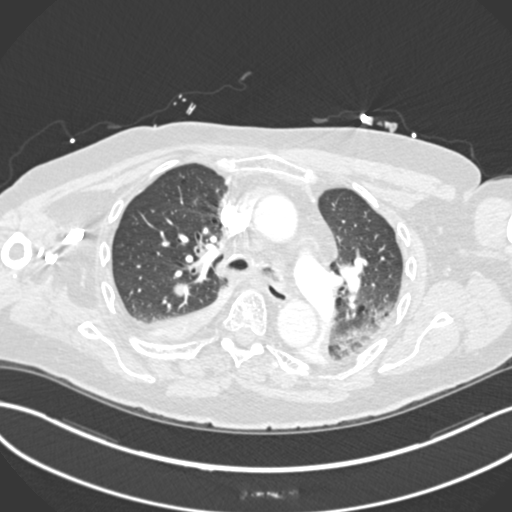
[im 205/277  soft-tissue]
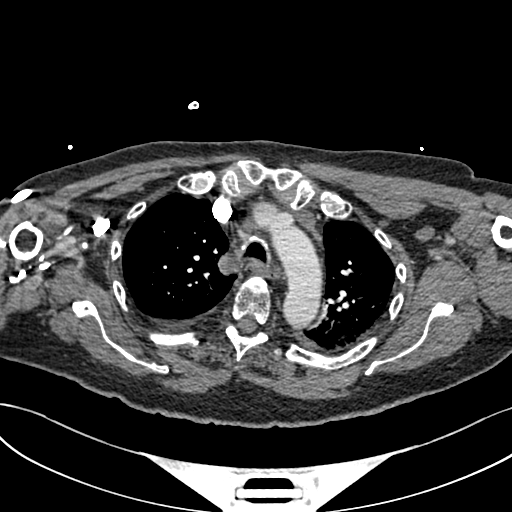
[im 217/277  lung]
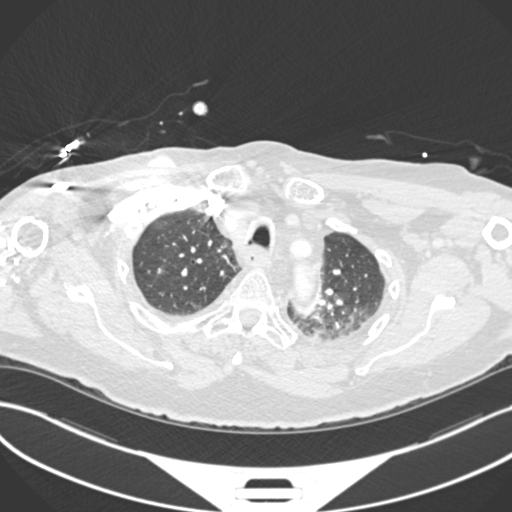
[im 241/277  soft-tissue]
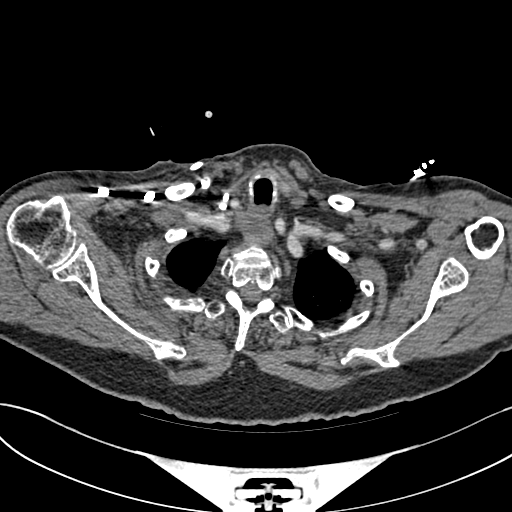
[im 265/277  lung]
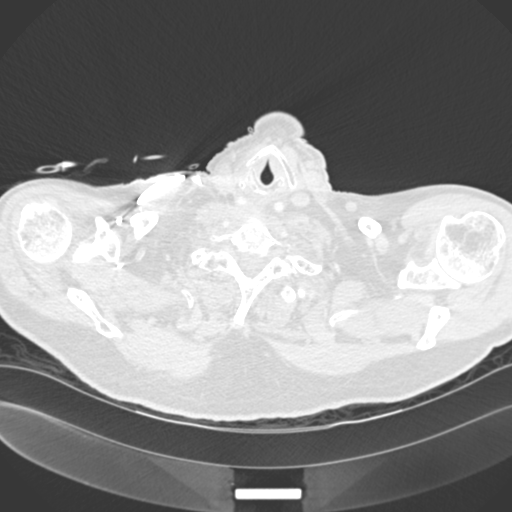

[Series 8: coronal mpr · coronal · 0.60mm/px · 3 of 138 slices shown]
[im 35/138  soft-tissue]
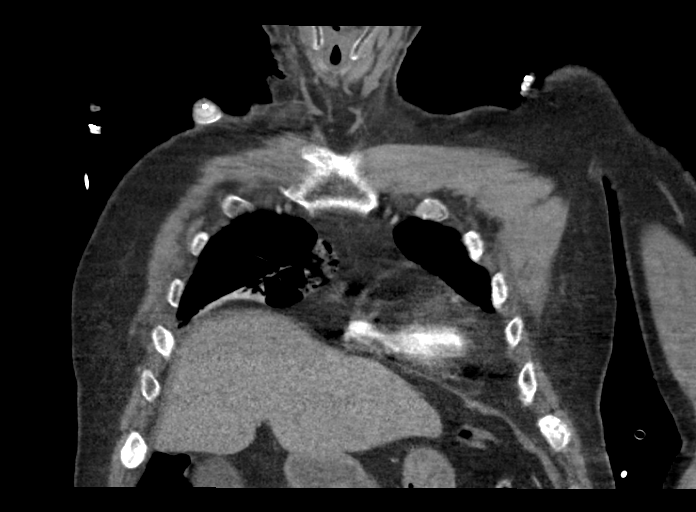
[im 69/138  soft-tissue]
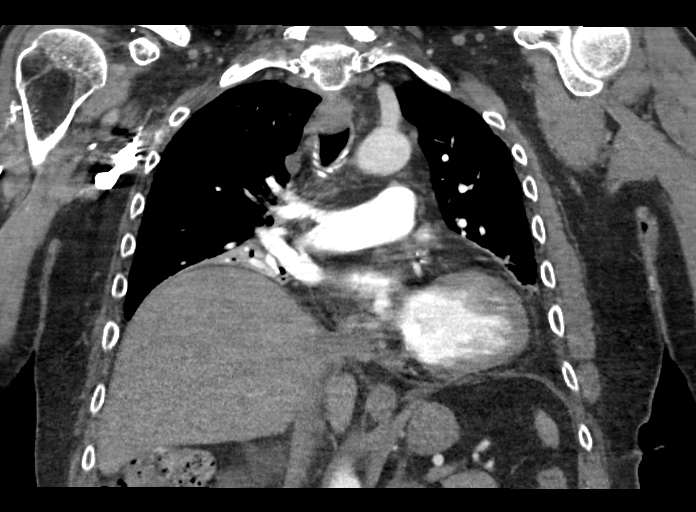
[im 103/138  soft-tissue]
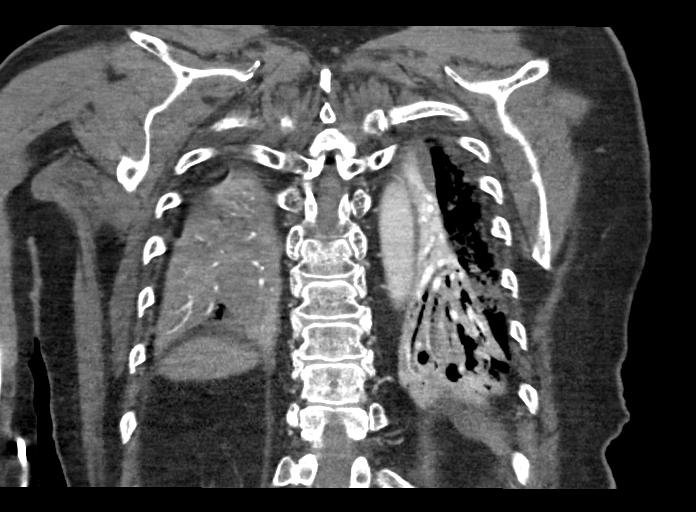

[16 of 46 positions shown; findings below may reference images not displayed]

FINDINGS: Cardiovascular: The pulmonary arteries are adequately opacified.
There is no evidence of pulmonary embolism. Central pulmonary
arteries are normal in caliber. The thoracic aorta is normal in
caliber. Stable heart size. Stable calcified coronary artery plaque.

Mediastinum/Nodes: Retrotracheal tumor at the level of the thoracic
inlet and extending into the lower neck is similar to prior recent
studies and abuts the esophagus. No evidence of mediastinal or hilar
lymphadenopathy. Increased prominence of some adjacent superior left
axillary lymph nodes with the largest measuring approximately 1 cm
in short axis.

Lungs/Pleura: New extensive airspace consolidation and volume loss
of the right lower lobe and right middle lobe with visible occlusive
debris in the bronchus intermedius and scattered small air
bronchograms in consolidated lung. Visible nearly occlusive debris
in the left mainstem bronchus and debris in the left lower lobe
bronchus with near complete atelectasis of the left lower lobe.
Focal airspace disease in the medial aspect of the right middle lobe
and posterior left upper lobe. Pulmonary findings are consistent
with bilateral pneumonia and highly suggestive of aspiration
pneumonia given the extensive debris in the airways. No significant
associated pleural fluid or evidence of pneumothorax.

Known metastatic pulmonary nodule in the superior right upper lobe
shows interval enlargement since the prior study in [place]. This
previously measured 8 mm and now measures 13 x 13 mm. Metastatic
mass in the inferior aspect of the right upper lobe previously
measuring 1.2 x 1.3 cm shows enlargement and now measures
approximately 2.8 x 2.9 cm. Previously noted right lower lobe
pulmonary nodule is not visible on today study due to the extensive
consolidation and volume loss of the right lower lobe.

Musculoskeletal: No chest wall abnormality. No acute or significant
osseous findings.

Review of the MIP images confirms the above findings.
IMPRESSION: 1. No evidence of pulmonary embolism.
2. New extensive airspace consolidation and volume loss of the right
lower lobe, right middle lobe and left lower lobe with significant
debris in the right bronchus intermedius, left mainstem bronchus and
left lower lobe bronchus. Additional airspace disease in the right
middle lobe and left upper lobe. Findings are suggestive of
bilateral aspiration pneumonia.
3. Progression of metastatic disease in the right lung with
enlargement of superior right upper lobe and inferior right upper
lobe nodules as noted above. The previously noted right lower lobe
nodule cannot be assessed as it is obscured by consolidation of the
right lower lobe.
4. Similar appearance of retrotracheal tumor at the level of the
thoracic inlet.
5. Increased prominence of left axillary lymph nodes which only
measure 1 cm in short axis.

## 2021-08-08 MED ORDER — ENOXAPARIN SODIUM 40 MG/0.4ML IJ SOSY
40.0000 mg | PREFILLED_SYRINGE | INTRAMUSCULAR | Status: DC
  Administered 2021-08-08 – 2021-08-13 (×6): 40 mg via SUBCUTANEOUS
  Filled 2021-08-08 (×6): qty 0.4

## 2021-08-08 MED ORDER — SODIUM CHLORIDE 0.9 % IV SOLN
2.0000 g | Freq: Once | INTRAVENOUS | Status: AC
  Administered 2021-08-08: 2 g via INTRAVENOUS
  Filled 2021-08-08: qty 2

## 2021-08-08 MED ORDER — INSULIN ASPART 100 UNIT/ML IJ SOLN
0.0000 [IU] | INTRAMUSCULAR | Status: DC
  Administered 2021-08-08 – 2021-08-12 (×8): 1 [IU] via SUBCUTANEOUS
  Administered 2021-08-13: 2 [IU] via SUBCUTANEOUS
  Administered 2021-08-13: 1 [IU] via SUBCUTANEOUS
  Filled 2021-08-08 (×10): qty 1

## 2021-08-08 MED ORDER — LACTATED RINGERS IV BOLUS (SEPSIS): 1000.0000 mL | Freq: Once | INTRAVENOUS | Status: DC

## 2021-08-08 MED ORDER — NALOXONE HCL 2 MG/2ML IJ SOSY
PREFILLED_SYRINGE | INTRAMUSCULAR | Status: AC
  Administered 2021-08-08: 1 mg
  Filled 2021-08-08: qty 2

## 2021-08-08 MED ORDER — SODIUM CHLORIDE 0.9 % IV SOLN
2.0000 g | INTRAVENOUS | Status: AC
  Administered 2021-08-08 – 2021-08-12 (×5): 2 g via INTRAVENOUS
  Filled 2021-08-08 (×2): qty 20
  Filled 2021-08-08: qty 2
  Filled 2021-08-08 (×2): qty 20

## 2021-08-08 MED ORDER — METRONIDAZOLE 500 MG/100ML IV SOLN
500.0000 mg | Freq: Once | INTRAVENOUS | Status: AC
  Administered 2021-08-08: 500 mg via INTRAVENOUS
  Filled 2021-08-08: qty 100

## 2021-08-08 MED ORDER — LACTATED RINGERS IV BOLUS (SEPSIS)
1000.0000 mL | Freq: Once | INTRAVENOUS | Status: AC
  Administered 2021-08-08: 1000 mL via INTRAVENOUS

## 2021-08-08 MED ORDER — SODIUM CHLORIDE 0.9 % IV BOLUS
1000.0000 mL | Freq: Once | INTRAVENOUS | Status: AC
  Administered 2021-08-08: 1000 mL via INTRAVENOUS

## 2021-08-08 MED ORDER — LACTATED RINGERS IV SOLN: INTRAVENOUS | Status: DC

## 2021-08-08 MED ORDER — JEVITY 1.2 CAL PO LIQD
1000.0000 mL | ORAL | Status: DC
Start: 2021-08-08 — End: 2021-08-09
  Administered 2021-08-08: 1000 mL

## 2021-08-08 MED ORDER — METRONIDAZOLE 500 MG/100ML IV SOLN
500.0000 mg | Freq: Three times a day (TID) | INTRAVENOUS | Status: DC
  Administered 2021-08-08 – 2021-08-13 (×15): 500 mg via INTRAVENOUS
  Filled 2021-08-08 (×17): qty 100

## 2021-08-08 MED ORDER — IOHEXOL 350 MG/ML SOLN
60.0000 mL | Freq: Once | INTRAVENOUS | Status: AC | PRN
  Administered 2021-08-08: 60 mL via INTRAVENOUS

## 2021-08-08 MED ORDER — VANCOMYCIN HCL IN DEXTROSE 1-5 GM/200ML-% IV SOLN
1000.0000 mg | Freq: Once | INTRAVENOUS | Status: AC
  Administered 2021-08-08: 1000 mg via INTRAVENOUS
  Filled 2021-08-08: qty 200

## 2021-08-08 NOTE — Progress Notes (Signed)
[facility] following sepsis 

## 2021-08-08 NOTE — Progress Notes (Signed)
CODE SEPSIS - PHARMACY COMMUNICATION  **Broad Spectrum Antibiotics should be administered within 1 hour of Sepsis diagnosis**  Time Code Sepsis Called/Page Received: 1052  Antibiotics Ordered: vancomycin 1 gram and cefepime 2 grams  Time of 1st antibiotic administration: 1111  Additional action taken by pharmacy: None  If necessary, Name of Provider/Nurse Contacted: N/a    Wynelle Cleveland, PharmD Pharmacy Resident  08/08/2021 10:55 AM

## 2021-08-08 NOTE — H&P (Signed)
Chief Complaint: Acute hypoxia. Patient coming from home, he has been living independently prior to hospitalization. HPI:  Ivan Garcia. 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 Patient is poor historian, he has no memory of yesterday event.  Spoke with patient's sister, patient has severe dysphagia, he has been strictly NPO and receiving tube feeding.  He also has difficulty with ambulation using a walker due to very severe right hand carpal tunnel syndrome.  Patient was last seen normal last night by his friend, who set up his tube feeding.  He was found floor this morning, was brought to the emergency room.  ED course. Upon arriving the emergency room, his heart rate was 110, blood pressure 131/95, he was placed on 5 L oxygen for hypoxemia.  He has significant hyponatremia with sodium 126, glucose elevated at 272, creatinine 1.44.  CK17 39.  BNP 39. TSH 94,000.  T4 2.4.  He was COVID-negative.  Chest CT scan showed multilobar aspiration pneumonia.  He was received IV vancomycin, cefepime and Flagyl.  Past Medical History:  Diagnosis Date   Arthritis    Cancer ([place])    Head and neck cancer   Hemorrhoids    Hyperlipidemia    Hypertension     Past Surgical History:  Procedure Laterality Date   COLONOSCOPY WITH PROPOFOL N/A 04/04/2017   Procedure: COLONOSCOPY WITH PROPOFOL;  Surgeon: Robert Bellow, MD;  Location: [facility] ENDOSCOPY;  Service: Endoscopy;  Laterality: N/A;   IR GASTROSTOMY TUBE MOD SED  09/24/2020   MANDIBLE SURGERY     PORT-A-CATH REMOVAL  07/17/2018   PORTA CATH INSERTION N/A 12/19/2017   Procedure: PORTA CATH INSERTION;  Surgeon: Algernon Huxley, MD;  Location: [facility];  Service: Cardiovascular;  Laterality: N/A;   PORTA CATH  INSERTION N/A 07/17/2018   Procedure: PORTA CATH INSERTION;  Surgeon: Algernon Huxley, MD;  Location: [facility];  Service: Cardiovascular;  Laterality: N/A;   PORTA CATH INSERTION N/A 09/18/2019   Procedure: PORTA CATH INSERTION;  Surgeon: Algernon Huxley, MD;  Location: [facility];  Service: Cardiovascular;  Laterality: N/A;   TONSILLECTOMY      Family History  Problem Relation Age of Onset   Breast cancer Mother    Asthma Mother    Congestive Heart Failure Mother    Prostate cancer Father    Brain cancer Father    Bladder Cancer Father    Social History:  reports that he has never smoked. He has never used smokeless tobacco. He reports that he does not drink alcohol and does not use drugs.  Allergies:  Allergies  Allergen Reactions   Cetuximab Anaphylaxis    (Not in a hospital admission)   Results for orders placed or performed during the hospital encounter of 08/08/21 (from the past 48 hour(s))  Comprehensive metabolic panel     Status: Abnormal   Collection Time: 08/08/21  9:19 AM  Result Value Ref Range   Sodium 132 (L) 135 - 145 mmol/L   Potassium 4.8 3.5 - 5.1 mmol/L   Chloride 96 (L) 98 - 111 mmol/L   CO2 28 22 - 32 mmol/L   Glucose, Bld 141 (H) 70 - 99 mg/dL    Comment: Glucose reference range applies only to samples taken after fasting for  at least 8 hours.   BUN 35 (H) 8 - 23 mg/dL   Creatinine, Ser 1.87 (H) 0.61 - 1.24 mg/dL   Calcium 9.1 8.9 - 10.3 mg/dL   Total Protein 7.3 6.5 - 8.1 g/dL   Albumin 4.0 3.5 - 5.0 g/dL   AST 48 (H) 15 - 41 U/L   ALT 24 0 - 44 U/L   Alkaline Phosphatase 76 38 - 126 U/L   Total Bilirubin 0.9 0.3 - 1.2 mg/dL   GFR, Estimated 39 (L) >60 mL/min    Comment: (NOTE) Calculated using the CKD-EPI Creatinine Equation (2021)    Anion gap 8 5 - 15    Comment: Performed at [facility], [address] [place], [place] [address]  Brain natriuretic peptide     Status: None   Collection Time: 08/08/21  9:19 AM   Result Value Ref Range   B Natriuretic Peptide 39.0 0.0 - 100.0 pg/mL    Comment: Performed at [facility], [address] [place], [place] [address]  Lipase, blood     Status: Abnormal   Collection Time: 08/08/21  9:19 AM  Result Value Ref Range   Lipase 101 (H) 11 - 51 U/L    Comment: Performed at [facility], [place], [place] [address]  Troponin I (High Sensitivity)     Status: None   Collection Time: 08/08/21  9:19 AM  Result Value Ref Range   Troponin I (High Sensitivity) 10 <18 ng/L    Comment: (NOTE) Elevated high sensitivity troponin I (hsTnI) values and significant  changes across serial measurements may suggest ACS but many other  chronic and acute conditions are known to elevate hsTnI results.  Refer to the "Links" section for chest pain algorithms and additional  guidance. Performed at [facility], [address] [place], [place] [address]   Lactic acid, plasma     Status: Abnormal   Collection Time: 08/08/21  9:19 AM  Result Value Ref Range   Lactic Acid, Venous 2.5 (HH) 0.5 - 1.9 mmol/L    Comment: CRITICAL RESULT CALLED TO, READ BACK BY AND VERIFIED WITH [facility] MCIVER 08/08/21 4827 KLW Performed at [facility], [address] [place], [place] [address]   CBC with Differential     Status: Abnormal   Collection Time: 08/08/21  9:19 AM  Result Value Ref Range   WBC 19.8 (H) 4.0 - 10.5 K/uL   RBC 4.65 4.22 - 5.81 MIL/uL   Hemoglobin 14.6 13.0 - 17.0 g/dL   HCT 42.6 39.0 - 52.0 %   MCV 91.6 80.0 - 100.0 fL   MCH 31.4 26.0 - 34.0 pg   MCHC 34.3 30.0 - 36.0 g/dL   RDW 13.4 11.5 - 15.5 %   Platelets 390 150 - 400 K/uL   nRBC 0.0 0.0 - 0.2 %   Neutrophils Relative % 89 %   Neutro Abs 17.7 (H) 1.7 - 7.7 K/uL   Lymphocytes Relative 2 %   Lymphs Abs 0.4 (L) 0.7 - 4.0 K/uL   Monocytes Relative 8 %   Monocytes Absolute 1.5 (H) 0.1 - 1.0 K/uL   Eosinophils Relative 0 %   Eosinophils Absolute 0.0 0.0 - 0.5  K/uL   Basophils Relative 0 %   Basophils Absolute 0.1 0.0 - 0.1 K/uL   Immature Granulocytes 1 %   Abs Immature Granulocytes 0.10 (H) 0.00 - 0.07 K/uL    Comment: Performed at [facility], [address] [place], [place] [address]  D-dimer, quantitative     Status: Abnormal   Collection Time: 08/08/21  9:19 AM  Result Value Ref Range   D-Dimer, Quant 1.09 (H) 0.00 - 0.50 ug/mL-FEU    Comment: (NOTE) At the manufacturer cut-off value of 0.5 g/mL FEU, this assay has a negative predictive value of 95-100%.This assay is intended for use in conjunction with a clinical pretest probability (PTP) assessment model to exclude pulmonary embolism (PE) and deep venous thrombosis (DVT) in outpatients suspected of PE or DVT. Results should be correlated with clinical presentation. Performed at [facility], [address] [place], [place] [address]   CK     Status: Abnormal   Collection Time: 08/08/21  9:19 AM  Result Value Ref Range   Total CK 1,739 (H) 49 - 397 U/L    Comment: Performed at [facility], [address] [place], [place] [address]  Blood gas, venous     Status: Abnormal   Collection Time: 08/08/21  9:36 AM  Result Value Ref Range   FIO2 0.21    pH, Ven 7.36 7.250 - 7.430   pCO2, Ven 48 44.0 - 60.0 mmHg   pO2, Ven 48.0 (H) 32.0 - 45.0 mmHg   Bicarbonate 27.1 20.0 - 28.0 mmol/L   Acid-Base Excess 0.9 0.0 - 2.0 mmol/L   O2 Saturation 81.6 %   Patient temperature 37.0    Collection site VENOUS    Sample type VENOUS     Comment: Performed at [facility], [address] [place], [place] [address]  Lactic acid, plasma     Status: None   Collection Time: 08/08/21 11:09 AM  Result Value Ref Range   Lactic Acid, Venous 1.7 0.5 - 1.9 mmol/L    Comment: Performed at [facility], [address] [place], [place] [address]  Urinalysis, Complete w Microscopic     Status: Abnormal   Collection Time: 08/08/21 11:09 AM  Result  Value Ref Range   Color, Urine YELLOW YELLOW   APPearance CLEAR CLEAR   Specific Gravity, Urine 1.020 1.005 - 1.030   pH 6.0 5.0 - 8.0   Glucose, UA NEGATIVE NEGATIVE mg/dL   Hgb urine dipstick LARGE (A) NEGATIVE   Bilirubin Urine NEGATIVE NEGATIVE   Ketones, ur NEGATIVE NEGATIVE mg/dL   Protein, ur 100 (A) NEGATIVE mg/dL   Nitrite NEGATIVE NEGATIVE   Leukocytes,Ua NEGATIVE NEGATIVE   Squamous Epithelial / LPF NONE SEEN 0 - 5   WBC, UA NONE SEEN 0 - 5 WBC/hpf   RBC / HPF 0-5 0 - 5 RBC/hpf   Bacteria, UA RARE (A) NONE SEEN    Comment: Performed at [facility], [address], [place], [place] [address]  Urine Drug Screen, Qualitative     Status: Abnormal   Collection Time: 08/08/21 11:09 AM  Result Value Ref Range   Tricyclic, Ur Screen NONE DETECTED NONE DETECTED   Amphetamines, Ur Screen NONE DETECTED NONE DETECTED   MDMA (Ecstasy)Ur Screen NONE DETECTED NONE DETECTED   Cocaine Metabolite,Ur [place] NONE DETECTED NONE DETECTED   Opiate, Ur Screen POSITIVE (A) NONE DETECTED   Phencyclidine (PCP) Ur S NONE DETECTED NONE DETECTED   Cannabinoid 50 Ng, Ur [place] NONE DETECTED NONE DETECTED   Barbiturates, Ur Screen NONE DETECTED NONE DETECTED   Benzodiazepine, Ur Scrn NONE DETECTED NONE DETECTED   Methadone Scn, Ur NONE DETECTED NONE DETECTED    Comment: (NOTE) Tricyclics + metabolites, urine    Cutoff 1000  ng/mL Amphetamines + metabolites, urine  Cutoff 1000 ng/mL MDMA (Ecstasy), urine              Cutoff 500 ng/mL Cocaine Metabolite, urine          Cutoff 300 ng/mL Opiate + metabolites, urine        Cutoff 300 ng/mL Phencyclidine (PCP), urine         Cutoff 25 ng/mL Cannabinoid, urine                 Cutoff 50 ng/mL Barbiturates + metabolites, urine  Cutoff 200 ng/mL Benzodiazepine, urine              Cutoff 200 ng/mL Methadone, urine                   Cutoff 300 ng/mL  The urine drug screen provides only a preliminary, unconfirmed analytical test result and should not be  used for non-medical purposes. Clinical consideration and professional judgment should be applied to any positive drug screen result due to possible interfering substances. A more specific alternate chemical method must be used in order to obtain a confirmed analytical result. Gas chromatography / mass spectrometry (GC/MS) is the preferred confirm atory method. Performed at [facility], [place], [place] [address]   Troponin I (High Sensitivity)     Status: None   Collection Time: 08/08/21 11:09 AM  Result Value Ref Range   Troponin I (High Sensitivity) 11 <18 ng/L    Comment: (NOTE) Elevated high sensitivity troponin I (hsTnI) values and significant  changes across serial measurements may suggest ACS but many other  chronic and acute conditions are known to elevate hsTnI results.  Refer to the "Links" section for chest pain algorithms and additional  guidance. Performed at [facility], [address] [place], [place] [address]   Resp Panel by RT-PCR (Flu A&B, Covid)     Status: None   Collection Time: 08/08/21 11:13 AM   Specimen: Nasopharyngeal(NP) swabs in vial transport medium  Result Value Ref Range   SARS Coronavirus 2 by RT PCR NEGATIVE NEGATIVE    Comment: (NOTE) SARS-CoV-2 target nucleic acids are NOT DETECTED.  The SARS-CoV-2 RNA is generally detectable in upper respiratory specimens during the acute phase of infection. The lowest concentration of SARS-CoV-2 viral copies this assay can detect is 138 copies/mL. A negative result does not preclude SARS-Cov-2 infection and should not be used as the sole basis for treatment or other patient management decisions. A negative result may occur with  improper specimen collection/handling, submission of specimen other than nasopharyngeal swab, presence of viral mutation(s) within the areas targeted by this assay, and inadequate number of viral copies(<138 copies/mL). A negative result must  be combined with clinical observations, patient history, and epidemiological information. The expected result is Negative.  Fact Sheet for Patients:  EntrepreneurPulse.com.au  Fact Sheet for Healthcare Providers:  IncredibleEmployment.be  This test is no t yet approved or cleared by the [place] FDA and  has been authorized for detection and/or diagnosis of SARS-CoV-2 by FDA under an Emergency Use Authorization (EUA). This EUA will remain  in effect (meaning this test can be used) for the duration of the COVID-19 declaration under Section 564(b)(1) of the Act, 21 U.S.C.section 360bbb-3(b)(1), unless the authorization is terminated  or revoked sooner.       Influenza A by PCR NEGATIVE NEGATIVE   Influenza B by PCR NEGATIVE NEGATIVE    Comment: (NOTE) The Xpert  Xpress SARS-CoV-2/FLU/RSV plus assay is intended as an aid in the diagnosis of influenza from Nasopharyngeal swab specimens and should not be used as a sole basis for treatment. Nasal washings and aspirates are unacceptable for Xpert Xpress SARS-CoV-2/FLU/RSV testing.  Fact Sheet for Patients: EntrepreneurPulse.com.au  Fact Sheet for Healthcare Providers: IncredibleEmployment.be  This test is not yet approved or cleared by the [place] FDA and has been authorized for detection and/or diagnosis of SARS-CoV-2 by FDA under an Emergency Use Authorization (EUA). This EUA will remain in effect (meaning this test can be used) for the duration of the COVID-19 declaration under Section 564(b)(1) of the Act, 21 U.S.C. section 360bbb-3(b)(1), unless the authorization is terminated or revoked.  Performed at [facility], [address] [place], [place] [address]   Basic metabolic panel     Status: Abnormal   Collection Time: 08/08/21  1:35 PM  Result Value Ref Range   Sodium 126 (L) 135 - 145 mmol/L   Potassium 4.4 3.5 - 5.1 mmol/L    Chloride 95 (L) 98 - 111 mmol/L   CO2 24 22 - 32 mmol/L   Glucose, Bld 272 (H) 70 - 99 mg/dL    Comment: Glucose reference range applies only to samples taken after fasting for at least 8 hours.   BUN 28 (H) 8 - 23 mg/dL   Creatinine, Ser 1.44 (H) 0.61 - 1.24 mg/dL   Calcium 8.0 (L) 8.9 - 10.3 mg/dL   GFR, Estimated 54 (L) >60 mL/min    Comment: (NOTE) Calculated using the CKD-EPI Creatinine Equation (2021)    Anion gap 7 5 - 15    Comment: Performed at [facility], [address], [place], [place] [address]   CT HEAD WO CONTRAST (5MM)  Result Date: 08/08/2021 CLINICAL DATA:  Found on floor by family. Personal history of esophageal cancer. Feeding tube. Mental status change. Hypoxia. EXAM: CT HEAD WITHOUT CONTRAST CT CERVICAL SPINE WITHOUT CONTRAST TECHNIQUE: Multidetector CT imaging of the head and cervical spine was performed following the standard protocol without intravenous contrast. Multiplanar CT image reconstructions of the cervical spine were also generated. COMPARISON:  MRI had without and with contrast 04/10/2021 FINDINGS: CT HEAD FINDINGS Brain: No acute infarct, hemorrhage, or mass lesion is present. No significant white matter lesions are present. The ventricles are of normal size. No significant extraaxial fluid collection is present. The brainstem and cerebellum are within normal limits. Vascular: No hyperdense vessel or unexpected calcification. Skull: Right supraorbital soft tissue swelling and hematoma is present. No underlying fracture is present. Calvarium otherwise within normal limits. No other significant extracranial soft tissue injury is present. Sinuses/Orbits: Mild mucosal thickening is present in the maxillary sinuses bilaterally. No air-fluid levels are present. The globes and orbits are within normal limits. CT CERVICAL SPINE FINDINGS Alignment: No significant listhesis is present. Cervical lordosis preserved. Skull base and vertebrae: Craniocervical junction  is within normal limits. Vertebral body heights are normal. Narrow acute or healing fractures are present. Soft tissues and spinal canal: No prevertebral fluid or swelling. No visible canal hematoma. Esophageal mass the thoracic inlet again noted. Edema along the right vocal cord improved. Right IJ Port-A-Cath in place. Disc levels: Uncovertebral spurring leads to moderate foraminal narrowing at C3-4 and C5-6, left greater than right. Upper chest: Lung apices are clear. IMPRESSION: 1. Right supraorbital soft tissue swelling and hematoma without underlying fracture. 2. Normal CT appearance of the brain. 3. No acute fracture or traumatic subluxation in the cervical spine. 4. Multilevel  degenerative changes of the cervical spine as described. Electronically Signed   By: San Morelle M.D.   On: 08/08/2021 13:54   CT Angio Chest PE W and/or Wo Contrast  Result Date: 08/08/2021 CLINICAL DATA:  History metastatic or pharyngeal squamous carcinoma. Found on floor at home and hypoxic. Elevated white blood cell count, D-dimer and lipase. EXAM: CT ANGIOGRAPHY CHEST WITH CONTRAST TECHNIQUE: Multidetector CT imaging of the chest was performed using the standard protocol during bolus administration of intravenous contrast. Multiplanar CT image reconstructions and MIPs were obtained to evaluate the vascular anatomy. CONTRAST:  49m OMNIPAQUE IOHEXOL 350 MG/ML SOLN COMPARISON:  Prior CT of the chest on 04/01/2021 FINDINGS: Cardiovascular: The pulmonary arteries are adequately opacified. There is no evidence of pulmonary embolism. Central pulmonary arteries are normal in caliber. The thoracic aorta is normal in caliber. Stable heart size. Stable calcified coronary artery plaque. 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 No evidence of mediastinal or hilar lymphadenopathy. Increased prominence of  some adjacent superior left axillary lymph nodes with the largest measuring approximately 1 cm in short axis. Lungs/Pleura: New extensive airspace consolidation and volume loss of the right lower lobe and right middle lobe with visible occlusive debris in the bronchus intermedius and scattered small air bronchograms in consolidated lung. Visible nearly occlusive debris in the left mainstem bronchus and debris in the left lower lobe bronchus with near complete atelectasis of the left lower lobe. Focal airspace disease in the medial aspect of the right middle lobe and posterior left upper lobe. Pulmonary findings are consistent with bilateral pneumonia and highly suggestive of aspiration pneumonia given the extensive debris in the airways. No significant associated pleural fluid or evidence of pneumothorax. Known metastatic pulmonary nodule in the superior right upper lobe shows interval enlargement since the prior study in April. This previously measured 8 mm and now measures 13 x 13 mm. Metastatic mass in the inferior aspect of the right upper lobe previously measuring 1.2 x 1.3 cm shows enlargement and now measures approximately 2.8 x 2.9 cm. Previously noted right lower lobe pulmonary nodule is not visible on today study due to the extensive consolidation and volume loss of the right lower lobe. Musculoskeletal: No chest wall abnormality. No acute or significant osseous findings. Review of the MIP images confirms the above findings. IMPRESSION: 1. No evidence of pulmonary embolism. 2. New extensive airspace consolidation and volume loss of the right lower lobe, right middle lobe and left lower lobe with significant debris in the right bronchus intermedius, left mainstem bronchus and left lower lobe bronchus. Additional airspace disease in the right middle lobe and left upper lobe. Findings are suggestive of bilateral aspiration pneumonia. 3. Progression of metastatic disease in the right lung with enlargement of  superior right upper lobe and inferior right upper lobe nodules as noted above. The previously noted right lower lobe nodule cannot be assessed as it is obscured by consolidation of the right lower lobe. 4. 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 5. Increased prominence of left axillary lymph nodes which only measure 1 cm in short axis. Electronically Signed   By: GAletta EdouardM.D.   On: 08/08/2021 14:11   CT Cervical Spine Wo Contrast  Result Date: 08/08/2021 CLINICAL DATA:  Found on floor by family. Personal history of esophageal cancer. Feeding tube. Mental status change. Hypoxia. EXAM: CT HEAD  WITHOUT CONTRAST CT CERVICAL SPINE WITHOUT CONTRAST TECHNIQUE: Multidetector CT imaging of the head and cervical spine was performed following the standard protocol without intravenous contrast. Multiplanar CT image reconstructions of the cervical spine were also generated. COMPARISON:  MRI had without and with contrast 04/10/2021 FINDINGS: CT HEAD FINDINGS Brain: No acute infarct, hemorrhage, or mass lesion is present. No significant white matter lesions are present. The ventricles are of normal size. No significant extraaxial fluid collection is present. The brainstem and cerebellum are within normal limits. Vascular: No hyperdense vessel or unexpected calcification. Skull: Right supraorbital soft tissue swelling and hematoma is present. No underlying fracture is present. Calvarium otherwise within normal limits. No other significant extracranial soft tissue injury is present. Sinuses/Orbits: Mild mucosal thickening is present in the maxillary sinuses bilaterally. No air-fluid levels are present. The globes and orbits are within normal limits. CT CERVICAL SPINE FINDINGS Alignment: No significant listhesis is present. Cervical lordosis preserved. Skull base and vertebrae: Craniocervical junction is within normal limits. Vertebral body heights are normal. Narrow acute or healing  fractures are present. Soft tissues and spinal canal: No prevertebral fluid or swelling. No visible canal hematoma. Esophageal mass the thoracic inlet again noted. Edema along the right vocal cord improved. Right IJ Port-A-Cath in place. Disc levels: Uncovertebral spurring leads to moderate foraminal narrowing at C3-4 and C5-6, left greater than right. Upper chest: Lung apices are clear. IMPRESSION: 1. Right supraorbital soft tissue swelling and hematoma without underlying fracture. 2. Normal CT appearance of the brain. 3. No acute fracture or traumatic subluxation in the cervical spine. 4. Multilevel degenerative changes of the cervical spine as described. Electronically Signed   By: San Morelle M.D.   On: 08/08/2021 13:54   CT ABDOMEN PELVIS W CONTRAST  Result Date: 08/08/2021 CLINICAL DATA:  History of metastatic oropharyngeal squamous carcinoma. Found on floor at home and hypoxic. Elevated white blood cell count, D-dimer and lipase. EXAM: CT ABDOMEN AND PELVIS WITH CONTRAST TECHNIQUE: Multidetector CT imaging of the abdomen and pelvis was performed using the standard protocol following bolus administration of intravenous contrast. CONTRAST:  55m OMNIPAQUE IOHEXOL 350 MG/ML SOLN COMPARISON:  CT of the abdomen without contrast on 09/15/2020 and PET scan on 11/09/2020 FINDINGS: Hepatobiliary: Stable left lobe hepatic cyst. Gallbladder and bile ducts are unremarkable. Pancreas: Unremarkable. No pancreatic ductal dilatation or surrounding inflammatory changes. Spleen: Normal in size without focal abnormality. Adrenals/Urinary Tract: Stable right renal cyst. No hydronephrosis or calculi. No solid renal masses or adrenal masses. The bladder is unremarkable. Stomach/Bowel: Gastrostomy tube present with normal positioning within the lumen of the stomach. No evidence of bowel obstruction, ileus or inflammation. No mass lesions are seen involving the bowel. Diverticulosis of the descending and sigmoid colon  without evidence of diverticulitis. No free intraperitoneal air. Vascular/Lymphatic: No significant vascular findings are present. No enlarged abdominal or pelvic lymph nodes. Reproductive: Prostate is unremarkable. Other: No abdominal wall hernia or abnormality. No abdominopelvic ascites. Musculoskeletal: No acute or significant osseous findings. IMPRESSION: No acute findings in the abdomen or pelvis. Gastrostomy tube is present and in appropriate position. Electronically Signed   By: GAletta EdouardM.D.   On: 08/08/2021 14:22   DG Chest Portable 1 View  Result Date: 08/08/2021 CLINICAL DATA:  Low O2 saturation EXAM: PORTABLE CHEST 1 VIEW COMPARISON:  None FINDINGS: There is a chest port with catheter tip overlying the right atrium. Unchanged, enlarged cardiac silhouette. There are low lung volumes with bibasilar atelectasis and possible small effusions. Nodular opacities in the  right lung consistent with known pulmonary nodules. No new focal airspace disease. No visible pneumothorax. Bilateral shoulder degenerative changes. No acute osseous abnormality. Thoracic spondylosis. IMPRESSION: Low lung volumes with bibasilar subsegmental atelectasis and probable small bilateral pleural effusions. Electronically Signed   By: Maurine Simmering M.D.   On: 08/08/2021 09:54    Review of Systems  Constitutional:  Negative for chills, fever and unexpected weight change.  HENT:  Negative for postnasal drip and sore throat.   Eyes:  Negative for photophobia and pain.  Respiratory:  Positive for cough and shortness of breath. Negative for wheezing.   Cardiovascular:  Negative for chest pain, palpitations and leg swelling.  Gastrointestinal:  Negative for abdominal pain, constipation, diarrhea, nausea and vomiting.  Genitourinary:  Negative for dysuria, frequency and hematuria.  Musculoskeletal:  Positive for back pain. Negative for myalgias.  Skin:  Negative for rash and wound.  Neurological:  Negative for speech  difficulty, light-headedness and headaches.  Psychiatric/Behavioral:  Negative for agitation and hallucinations.    Blood pressure 130/89, pulse (!) 102, temperature 98.5 F (36.9 C), temperature source Oral, resp. rate 16, SpO2 91 %. Physical Exam Constitutional:      General: He is not in acute distress.    Appearance: He is not toxic-appearing.  HENT:     Head: Normocephalic and atraumatic.     Nose: Nose normal. No congestion.     Mouth/Throat:     Mouth: Mucous membranes are dry.  Eyes:     Extraocular Movements: Extraocular movements intact.     Conjunctiva/sclera: Conjunctivae normal.     Pupils: Pupils are equal, round, and reactive to light.  Neck:     Vascular: No carotid bruit.  Cardiovascular:     Rate and Rhythm: Normal rate and regular rhythm.     Heart sounds: No murmur heard.   No friction rub. No gallop.  Pulmonary:     Effort: Pulmonary effort is normal.     Breath sounds: Rhonchi and rales present.  Abdominal:     General: Abdomen is flat. There is no distension.     Palpations: Abdomen is soft.     Tenderness: There is no abdominal tenderness.  Musculoskeletal:        General: No swelling or tenderness. Normal range of motion.     Cervical back: Normal range of motion and neck supple. No tenderness.  Skin:    General: Skin is warm and dry.     Coloration: Skin is not jaundiced.  Neurological:     General: No focal deficit present.     Mental Status: He is alert.     Cranial Nerves: No cranial nerve deficit.     Comments: Disoriented to time.  Psychiatric:        Mood and Affect: Mood normal.        Behavior: Behavior normal.     Assessment/Plan Acute hypoxemic respiratory failure secondary to pneumonia. 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 Sepsis secondary to pneumonia. Patient met septic criteria with tachycardia, significant leukocytosis, lactic acidosis and acute kidney injury.  This is secondary to  pneumonia. Patient also has been requiring 5 L oxygen after arriving the hospital, current oxygen saturation was 88%.  This is also secondary to pneumonia. I reviewed patient CT scan results, consistent with aspiration pneumonia. Patient appears not able to clear up his upper airway.  He is strictly n.p.o. I will continue antibiotics with Rocephin and Flagyl. Pending culture results.  Acute kidney injury secondary to  dehydration and rhabdomyolysis. Hyponatremia secondary to dehydration. Mild rhabdomyolysis. Fall Head concussion. Does not remember falling.  But he was taking higher dose of pain medicine, mechanical fall most likely due to pain medicine. I will continue fluids with normal saline.  Monitor renal function and electrolytes. Hematoma on his forehead, but CT scan did not show any intracranial hemorrhage.  We will continue to follow.  Uncontrolled type 2 diabetes with hyperglycemia. Glucose running high at 272 this afternoon, will start sliding scale insulin and follow-up on glucose.  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 Patient will be n.p.o., starting tube feeding.  Dietitian consult. Follow-up with neurology as outpatient.  Chronic pain disorder. Restarted pain medicine    Sharen Hones, MD 08/08/2021, 4:11 PM

## 2021-08-08 NOTE — ED Provider Notes (Signed)
[facility] Emergency Department Provider Note   ____________________________________________   Event Date/Time   First MD Initiated Contact with Patient 08/08/21 0920     (approximate)  I have reviewed the triage vital signs and the nursing notes.   HISTORY  Chief Complaint Altered Mental Status    HPI Zyah Westhoff Vicki Bessey. is a 65 y.o. male patient comes in with EMS.  He is groggy but arousable and can answer questions.  He reports he lives alone was found by family.  He was on the floor but he was uncertain how long he was there.  He has esophageal cancer and has a feeding tube in place.  Initial sats were in the upper 80s.  Placed patient was placed on 2 L nasal cannula and then 100% nonrebreather with sats coming up to 94%.  Patient is slightly tachycardic.  Heart rate high it was 116.  Comes down to 101.  Patient complains of some diffuse abdominal pain.        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   Bone pain 07/14/2020   Encounter for antineoplastic immunotherapy 04/27/2020   Stage 3a chronic kidney disease ([place]) 12/22/2019   Encounter for antineoplastic chemotherapy 10/06/2019   Neoplasm related pain 10/06/2019   Stage 3 chronic kidney disease ([place]) 03/24/2018   Edema 03/24/2018   Port-A-Cath in place 03/24/2018   ARF (acute renal failure) ([place]) 01/02/2018   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   Goals of care, counseling/discussion 12/10/2017   Encounter for screening colonoscopy  02/28/2017    Past Surgical History:  Procedure Laterality Date   COLONOSCOPY WITH PROPOFOL N/A 04/04/2017   Procedure: COLONOSCOPY WITH PROPOFOL;  Surgeon: Robert Bellow, MD;  Location: [facility] ENDOSCOPY;  Service: Endoscopy;  Laterality: N/A;   IR GASTROSTOMY TUBE MOD SED  09/24/2020   MANDIBLE SURGERY     PORT-A-CATH REMOVAL  07/17/2018   PORTA CATH INSERTION N/A 12/19/2017   Procedure: PORTA CATH INSERTION;  Surgeon: Algernon Huxley, MD;  Location: [place] CV LAB;  Service: Cardiovascular;  Laterality: N/A;   PORTA CATH INSERTION N/A 07/17/2018   Procedure: PORTA CATH INSERTION;  Surgeon: Algernon Huxley, MD;  Location: [place] CV LAB;  Service: Cardiovascular;  Laterality: N/A;   PORTA CATH INSERTION N/A 09/18/2019   Procedure: PORTA CATH INSERTION;  Surgeon: Algernon Huxley, MD;  Location: [place] CV LAB;  Service: Cardiovascular;  Laterality: N/A;   TONSILLECTOMY      Prior to Admission medications   Medication Sig Start Date End Date Taking? Authorizing Provider  acetaminophen (TYLENOL) 160 MG/5ML solution Take by mouth. Patient not taking: No sig reported 05/13/21   [provider]  amLODipine (NORVASC) 5 MG tablet Take 5 mg by mouth daily.    [provider]  atorvastatin (LIPITOR) 20 MG tablet SMARTSIG:1 Tablet(s) By Mouth Every Evening 01/15/21   [provider]  cetirizine (ZYRTEC) 10 MG tablet Take by mouth. Patient not taking: No sig reported    [provider]  famotidine (PEPCID) 20 MG tablet Take by mouth. Patient not taking: No sig reported 04/29/21   [provider]  gabapentin (NEURONTIN) 100 MG capsule TAKE 2 CAPSULES BY MOUTH 3 TIMES DAILY. Patient not taking: No sig reported 05/31/21   [provider]  HYDROcodone-acetaminophen (NORCO) 10-325 MG tablet Take 1 tablet by mouth every 6 (six) hours as needed. 07/25/21   Borders, Kirt Boys, NP  ibuprofen (ADVIL) 100 MG/5ML suspension Take by mouth. Patient not  taking: No sig reported 05/13/21   [provider]  lenvatinib 20 mg daily dose (LENVIMA, 20 MG DAILY DOSE,) 2 x 10 MG capsule Take 2 capsules (20 mg total) by mouth daily. 07/27/21   Earlie Server, MD  levothyroxine (SYNTHROID) 50 MCG tablet Take 1 tablet (50 mcg total) by mouth daily before breakfast. 08/02/21   Earlie Server, MD  lidocaine-prilocaine (EMLA) cream Apply to affected area once Patient not taking: No sig reported 09/10/19   Earlie Server, MD  metoprolol succinate (TOPROL-XL) 25 MG 24 hr tablet Take 1 tablet by mouth daily. Patient not taking: No sig reported 05/04/21   [provider]  nystatin (MYCOSTATIN) 100000 UNIT/ML suspension Take 5 mLs (500,000 Units total) by mouth in the morning, at noon, and at bedtime. SWISH AND SPIT Patient not taking: No sig reported 08/17/20   Earlie Server, MD  omeprazole (PRILOSEC) 20 MG capsule Take 1 capsule (20 mg total) by mouth daily. Patient not taking: No sig reported 03/03/21   Earlie Server, MD  ondansetron (ZOFRAN) 8 MG tablet Take 1 tablet (8 mg total) by mouth 2 (two) times daily as needed for refractory nausea / vomiting. Start on day 3 after carboplatin chemo. Patient not taking: No sig reported 11/04/20   Earlie Server, MD  oxyCODONE (ROXICODONE) 5 MG/5ML solution PLACE 5-10 MLS (5-10 MG TOTAL) INTO FEEDING TUBE EVERY 4 (FOUR) HOURS AS NEEDED FOR SEVERE PAIN. Patient not taking: No sig reported 05/17/21   [provider]  predniSONE (DELTASONE) 50 MG tablet Take 1 tablet (50 mg total) by mouth daily with breakfast. Patient not taking: No sig reported 07/08/21   Ventura Sellers, MD  pregabalin (LYRICA) 150 MG capsule Take 1 capsule (150 mg total) by mouth 2 (two) times daily. Patient not taking: No sig reported 07/08/21   Ventura Sellers, MD  prochlorperazine (COMPAZINE) 10 MG tablet Take 1 tablet (10 mg total) by mouth every 6 (six) hours as needed (Nausea or vomiting). Patient not taking: No sig reported 09/10/19   Earlie Server, MD  senna-docusate  (SENOKOT-S) 8.6-50 MG tablet Take 2 tablets by mouth daily. Patient not taking: No sig reported 05/27/21   Earlie Server, MD    Allergies Cetuximab  Family History  Problem Relation Age of Onset   Breast cancer Mother    Asthma Mother    Congestive Heart Failure Mother    Prostate cancer Father    Brain cancer Father    Bladder Cancer Father     Social History Social History   Tobacco Use   Smoking status: Never   Smokeless tobacco: Never  Vaping Use   Vaping Use: Never used  Substance Use Topics   Alcohol use: No   Drug use: No    Review of Systems  Constitutional: No fever/chills Eyes: No visual changes. ENT: No sore throat. Cardiovascular: Denies chest pain. Respiratory: Denies shortness of breath. Gastrointestinal: abdominal pain.  No nausea, no vomiting.  No diarrhea.  No constipation. Genitourinary: Negative for  dysuria. Musculoskeletal: Negative for back pain. Skin: Negative for rash. Neurological: Negative for headaches, focal weakness  ____________________________________________   PHYSICAL EXAM:  VITAL SIGNS: ED Triage Vitals  Enc Vitals Group     BP 08/08/21 0904 132/86     Pulse Rate 08/08/21 0904 (!) 114     Resp 08/08/21 0904 16     Temp 08/08/21 0904 98.2 F (36.8 C)     Temp Source 08/08/21 0904 Oral     SpO2 08/08/21 0903 (!) 86 %     Weight --      Height --      Head Circumference --      Peak Flow --      Pain Score 08/08/21 0905 6     Pain Loc --      Pain Edu? --      Excl. in [place]? --    Constitutional: Patient is groggy but arousable.  We give him some Narcan as his pupils are pinpoint.  He wakes up a little bit more but his O2 sats really did not change. Eyes: Conjunctivae are normal. PER EOMI. Head: Atraumatic. Nose: No congestion/rhinnorhea. Mouth/Throat: Mucous membranes are moist.  Oropharynx non-erythematous. Neck: No stridor. ardiovascular: Normal rate, regular rhythm. Grossly normal heart sounds.  Good peripheral  circulation. Respiratory: Normal respiratory effort.  No retractions. Lungs CTAB. Gastrointestinal: Soft patient reports pain on palpation.  G-tube in place.  Seems to be somewhat diffuse.  No distention. No abdominal bruits. No CVA tenderness. Musculoskeletal: No lower extremity tenderness nor edema.  Neurologic:  Normal speech and language. No gross focal neurologic deficits are appreciated.  Skin:  Skin is warm, dry and intact. No rash noted.   ____________________________________________   LABS (all labs ordered are listed, but only abnormal results are displayed)  Labs Reviewed  COMPREHENSIVE METABOLIC PANEL - Abnormal; Notable for the following components:      Result Value   Sodium 132 (*)    Chloride 96 (*)    Glucose, Bld 141 (*)    BUN 35 (*)    Creatinine, Ser 1.87 (*)    AST 48 (*)    GFR, Estimated 39 (*)    All other components within normal limits  LIPASE, BLOOD - Abnormal; Notable for the following components:   Lipase 101 (*)    All other components within normal limits  LACTIC ACID, PLASMA - Abnormal; Notable for the following components:   Lactic Acid, Venous 2.5 (*)    All other components within normal limits  CBC WITH DIFFERENTIAL/PLATELET - Abnormal; Notable for the following components:   WBC 19.8 (*)    Neutro Abs 17.7 (*)    Lymphs Abs 0.4 (*)    Monocytes Absolute 1.5 (*)    Abs Immature Granulocytes 0.10 (*)    All other components within normal limits  BLOOD GAS, VENOUS - Abnormal; Notable for the following components:   pO2, Ven 48.0 (*)    All other components within normal limits  URINALYSIS, COMPLETE (UACMP) WITH MICROSCOPIC - Abnormal; Notable for the following components:   Hgb urine dipstick LARGE (*)    Protein, ur 100 (*)    Bacteria, UA RARE (*)    All other components within normal limits  URINE DRUG SCREEN, QUALITATIVE ([facility] ONLY) - Abnormal; Notable for the following components:   Opiate, Ur Screen POSITIVE (*)    All other  components within normal limits  D-DIMER, QUANTITATIVE - Abnormal; Notable for the following components:   D-Dimer,  Quant 1.09 (*)    All other components within normal limits  CK - Abnormal; Notable for the following components:   Total CK 1,739 (*)    All other components within normal limits  BASIC METABOLIC PANEL - Abnormal; Notable for the following components:   Sodium 126 (*)    Chloride 95 (*)    Glucose, Bld 272 (*)    BUN 28 (*)    Creatinine, Ser 1.44 (*)    Calcium 8.0 (*)    GFR, Estimated 54 (*)    All other components within normal limits  RESP PANEL BY RT-PCR (FLU A&B, COVID) ARPGX2  CULTURE, BLOOD (SINGLE)  CULTURE, BLOOD (ROUTINE X 2)  BRAIN NATRIURETIC PEPTIDE  LACTIC ACID, PLASMA  TROPONIN I (HIGH SENSITIVITY)  TROPONIN I (HIGH SENSITIVITY)   ____________________________________________  EKG  EKG read interpreted by me shows sinus tachycardia rate of 110 normal axis S1Q3T3 is present.  Flipped T's in 3 and F. ____________________________________________  RADIOLOGY Gertha Calkin, personally viewed and evaluated these images (plain radiographs) as part of my medical decision making, as well as reviewing the written report by the radiologist.  ED MD interpretation: Chest x-ray read by radiology reviewed by me does show low lung volumes possible small effusions bilaterally.  There may be a little bit of haziness in the right base. CT shows a fairly large right sided pneumonia with some debris.  Possibly left as well looks like aspiration pneumonia to the radiologist and myself.  Patient's O2 sats have dropped somewhat.  CT of the abdomen does not show any acute pathology to the radiologist I do not see any myself.  CT head and neck do not show any acute pathology  Official radiology report(s): CT HEAD WO CONTRAST (5MM)  Result Date: 08/08/2021 CLINICAL DATA:  Found on floor by family. Personal history of esophageal cancer. Feeding tube. Mental status  change. Hypoxia. EXAM: CT HEAD WITHOUT CONTRAST CT CERVICAL SPINE WITHOUT CONTRAST TECHNIQUE: Multidetector CT imaging of the head and cervical spine was performed following the standard protocol without intravenous contrast. Multiplanar CT image reconstructions of the cervical spine were also generated. COMPARISON:  MRI had without and with contrast 04/10/2021 FINDINGS: CT HEAD FINDINGS Brain: No acute infarct, hemorrhage, or mass lesion is present. No significant white matter lesions are present. The ventricles are of normal size. No significant extraaxial fluid collection is present. The brainstem and cerebellum are within normal limits. Vascular: No hyperdense vessel or unexpected calcification. Skull: Right supraorbital soft tissue swelling and hematoma is present. No underlying fracture is present. Calvarium otherwise within normal limits. No other significant extracranial soft tissue injury is present. Sinuses/Orbits: Mild mucosal thickening is present in the maxillary sinuses bilaterally. No air-fluid levels are present. The globes and orbits are within normal limits. CT CERVICAL SPINE FINDINGS Alignment: No significant listhesis is present. Cervical lordosis preserved. Skull base and vertebrae: Craniocervical junction is within normal limits. Vertebral body heights are normal. Narrow acute or healing fractures are present. Soft tissues and spinal canal: No prevertebral fluid or swelling. No visible canal hematoma. Esophageal mass the thoracic inlet again noted. Edema along the right vocal cord improved. Right IJ Port-A-Cath in place. Disc levels: Uncovertebral spurring leads to moderate foraminal narrowing at C3-4 and C5-6, left greater than right. Upper chest: Lung apices are clear. IMPRESSION: 1. Right supraorbital soft tissue swelling and hematoma without underlying fracture. 2. Normal CT appearance of the brain. 3. No acute fracture or traumatic subluxation in the cervical  spine. 4. Multilevel  degenerative changes of the cervical spine as described. Electronically Signed   By: San Morelle M.D.   On: 08/08/2021 13:54   CT Angio Chest PE W and/or Wo Contrast  Result Date: 08/08/2021 CLINICAL DATA:  History metastatic or pharyngeal squamous carcinoma. Found on floor at home and hypoxic. Elevated white blood cell count, D-dimer and lipase. EXAM: CT ANGIOGRAPHY CHEST WITH CONTRAST TECHNIQUE: Multidetector CT imaging of the chest was performed using the standard protocol during bolus administration of intravenous contrast. Multiplanar CT image reconstructions and MIPs were obtained to evaluate the vascular anatomy. CONTRAST:  60m OMNIPAQUE IOHEXOL 350 MG/ML SOLN COMPARISON:  Prior CT of the chest on 04/01/2021 FINDINGS: Cardiovascular: The pulmonary arteries are adequately opacified. There is no evidence of pulmonary embolism. Central pulmonary arteries are normal in caliber. The thoracic aorta is normal in caliber. Stable heart size. Stable calcified coronary artery plaque. 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 No evidence of mediastinal or hilar lymphadenopathy. Increased prominence of some adjacent superior left axillary lymph nodes with the largest measuring approximately 1 cm in short axis. Lungs/Pleura: New extensive airspace consolidation and volume loss of the right lower lobe and right middle lobe with visible occlusive debris in the bronchus intermedius and scattered small air bronchograms in consolidated lung. Visible nearly occlusive debris in the left mainstem bronchus and debris in the left lower lobe bronchus with near complete atelectasis of the left lower lobe. Focal airspace disease in the medial aspect of the right middle lobe and posterior left upper lobe. Pulmonary findings are consistent with bilateral pneumonia and highly suggestive of aspiration  pneumonia given the extensive debris in the airways. No significant associated pleural fluid or evidence of pneumothorax. Known metastatic pulmonary nodule in the superior right upper lobe shows interval enlargement since the prior study in April. This previously measured 8 mm and now measures 13 x 13 mm. Metastatic mass in the inferior aspect of the right upper lobe previously measuring 1.2 x 1.3 cm shows enlargement and now measures approximately 2.8 x 2.9 cm. Previously noted right lower lobe pulmonary nodule is not visible on today study due to the extensive consolidation and volume loss of the right lower lobe. Musculoskeletal: No chest wall abnormality. No acute or significant osseous findings. Review of the MIP images confirms the above findings. IMPRESSION: 1. No evidence of pulmonary embolism. 2. New extensive airspace consolidation and volume loss of the right lower lobe, right middle lobe and left lower lobe with significant debris in the right bronchus intermedius, left mainstem bronchus and left lower lobe bronchus. Additional airspace disease in the right middle lobe and left upper lobe. Findings are suggestive of bilateral aspiration pneumonia. 3. Progression of metastatic disease in the right lung with enlargement of superior right upper lobe and inferior right upper lobe nodules as noted above. The previously noted right lower lobe nodule cannot be assessed as it is obscured by consolidation of the right lower lobe. 4. 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 5. Increased prominence of left axillary lymph nodes which only measure 1 cm in short axis. Electronically Signed   By: GAletta EdouardM.D.   On: 08/08/2021 14:11   CT Cervical Spine Wo Contrast  Result Date: 08/08/2021 CLINICAL DATA:  Found on floor by family. Personal history of esophageal cancer. Feeding tube. Mental status change. Hypoxia.  EXAM: CT HEAD WITHOUT CONTRAST CT CERVICAL SPINE WITHOUT  CONTRAST TECHNIQUE: Multidetector CT imaging of the head and cervical spine was performed following the standard protocol without intravenous contrast. Multiplanar CT image reconstructions of the cervical spine were also generated. COMPARISON:  MRI had without and with contrast 04/10/2021 FINDINGS: CT HEAD FINDINGS Brain: No acute infarct, hemorrhage, or mass lesion is present. No significant white matter lesions are present. The ventricles are of normal size. No significant extraaxial fluid collection is present. The brainstem and cerebellum are within normal limits. Vascular: No hyperdense vessel or unexpected calcification. Skull: Right supraorbital soft tissue swelling and hematoma is present. No underlying fracture is present. Calvarium otherwise within normal limits. No other significant extracranial soft tissue injury is present. Sinuses/Orbits: Mild mucosal thickening is present in the maxillary sinuses bilaterally. No air-fluid levels are present. The globes and orbits are within normal limits. CT CERVICAL SPINE FINDINGS Alignment: No significant listhesis is present. Cervical lordosis preserved. Skull base and vertebrae: Craniocervical junction is within normal limits. Vertebral body heights are normal. Narrow acute or healing fractures are present. Soft tissues and spinal canal: No prevertebral fluid or swelling. No visible canal hematoma. Esophageal mass the thoracic inlet again noted. Edema along the right vocal cord improved. Right IJ Port-A-Cath in place. Disc levels: Uncovertebral spurring leads to moderate foraminal narrowing at C3-4 and C5-6, left greater than right. Upper chest: Lung apices are clear. IMPRESSION: 1. Right supraorbital soft tissue swelling and hematoma without underlying fracture. 2. Normal CT appearance of the brain. 3. No acute fracture or traumatic subluxation in the cervical spine. 4. Multilevel degenerative changes of the cervical spine as described. Electronically Signed    By: San Morelle M.D.   On: 08/08/2021 13:54   CT ABDOMEN PELVIS W CONTRAST  Result Date: 08/08/2021 CLINICAL DATA:  History of metastatic oropharyngeal squamous carcinoma. Found on floor at home and hypoxic. Elevated white blood cell count, D-dimer and lipase. EXAM: CT ABDOMEN AND PELVIS WITH CONTRAST TECHNIQUE: Multidetector CT imaging of the abdomen and pelvis was performed using the standard protocol following bolus administration of intravenous contrast. CONTRAST:  34m OMNIPAQUE IOHEXOL 350 MG/ML SOLN COMPARISON:  CT of the abdomen without contrast on 09/15/2020 and PET scan on 11/09/2020 FINDINGS: Hepatobiliary: Stable left lobe hepatic cyst. Gallbladder and bile ducts are unremarkable. Pancreas: Unremarkable. No pancreatic ductal dilatation or surrounding inflammatory changes. Spleen: Normal in size without focal abnormality. Adrenals/Urinary Tract: Stable right renal cyst. No hydronephrosis or calculi. No solid renal masses or adrenal masses. The bladder is unremarkable. Stomach/Bowel: Gastrostomy tube present with normal positioning within the lumen of the stomach. No evidence of bowel obstruction, ileus or inflammation. No mass lesions are seen involving the bowel. Diverticulosis of the descending and sigmoid colon without evidence of diverticulitis. No free intraperitoneal air. Vascular/Lymphatic: No significant vascular findings are present. No enlarged abdominal or pelvic lymph nodes. Reproductive: Prostate is unremarkable. Other: No abdominal wall hernia or abnormality. No abdominopelvic ascites. Musculoskeletal: No acute or significant osseous findings. IMPRESSION: No acute findings in the abdomen or pelvis. Gastrostomy tube is present and in appropriate position. Electronically Signed   By: GAletta EdouardM.D.   On: 08/08/2021 14:22   DG Chest Portable 1 View  Result Date: 08/08/2021 CLINICAL DATA:  Low O2 saturation EXAM: PORTABLE CHEST 1 VIEW COMPARISON:  None FINDINGS: There is a  chest port with catheter tip overlying the right atrium. Unchanged, enlarged cardiac silhouette. There are low lung volumes with bibasilar atelectasis and possible small effusions.  Nodular opacities in the right lung consistent with known pulmonary nodules. No new focal airspace disease. No visible pneumothorax. Bilateral shoulder degenerative changes. No acute osseous abnormality. Thoracic spondylosis. IMPRESSION: Low lung volumes with bibasilar subsegmental atelectasis and probable small bilateral pleural effusions. Electronically Signed   By: Maurine Simmering M.D.   On: 08/08/2021 09:54    ____________________________________________   PROCEDURES  Procedure(s) performed (including Critical Care): Critical care time 45 minutes.  This includes examining patient going back and talking to him at least 2 other times.  Additionally have to speak with the hospitalist I reviewed all of his studies and looked at his past medical history.  Procedures   ____________________________________________   INITIAL IMPRESSION / ASSESSMENT AND PLAN / ED COURSE  ----------------------------------------- 11:33 AM on 08/08/2021 ----------------------------------------- Patient more awake and alert now.  Sats are 95% on 4 L.  He says he took some morphine prior to ending up on the floor.  Not sure when he ended up on the floor though.  His white count is 19,000.  He did at some point recently get a shot of cortisone for carpal tunnel.  Not sure if this could have had that effect or not his CK is elevated so he has rhabdomyolysis.  This is probably from laying on the floor for a while.  On reexam he has no obvious bedsores or redness or skin breakdown.  His abdomen is nontender currently although he has some pain in the right shoulder not with movement.  O2 sats have improved also.  Heart rate is down after some fluids we will still plan on getting the CT scans and I will repeat his BMP shortly to see what his creatinine  is doing.    ----------------------------------------- 2:31 PM on 08/08/2021 ----------------------------------------- CTs have returned.  CT shows pneumonia likely aspiration due to the debris present.  Patient is more awake and alert but his hypoxia has worsened slightly.  We will have to up his oxygen.  Plan on keeping him in the hospital treating for sepsis he does meet criteria for that.  He also has rhabdomyolysis.  He also has acute kidney injury.  We will have to treat all of these.  He will require a lot of fluid but will be have to be careful not to put him into congestive heart failure.          ____________________________________________   FINAL CLINICAL IMPRESSION(S) / ED DIAGNOSES  Final diagnoses:  Altered mental status, unspecified altered mental status type  AKI (acute kidney injury) ([place])  Hypotension due to hypovolemia  Hypoxia  Aspiration pneumonia of right lung due to milk, unspecified part of lung [facility])  Non-traumatic rhabdomyolysis     ED Discharge Orders     None        Note:  This document was prepared using Dragon voice recognition software and may include unintentional dictation errors.    Nena Polio, MD 08/08/21 [phone]

## 2021-08-08 NOTE — ED Triage Notes (Signed)
Pt arrives via ems from home, pt reports that he lives alone, was found by family, pt is unable to states how long he was in the floor. Ems reports that he didn't appear soiled lying there, pt is receiving treatment for esophageal cancer, pt has a feeding tube in place, ems placed an 18g to the left forearm.pt was found to have sats in the upper 80's was placed on 2L [place], 138p, and given 552m's NS

## 2021-08-08 NOTE — Progress Notes (Signed)
PHARMACY -  BRIEF ANTIBIOTIC NOTE   Pharmacy has received consult(s) for sepsis from an ED provider.  The patient's profile has been reviewed for ht/wt/allergies/indication/available labs.    One time order(s) placed for vancomycin 1 gram and cefepime 2 grams  Further antibiotics/pharmacy consults should be ordered by admitting physician if indicated.                       Thank you, Wynelle Cleveland, PharmD Pharmacy Resident  08/08/2021 10:56 AM

## 2021-08-08 NOTE — ED Notes (Signed)
Pt satting 92-94% on NRB. Pt trialed on 4L [place], sats dropped down to 84-86%. Placed back on NRB at this time

## 2021-08-08 NOTE — ED Notes (Signed)
Pt woke up more after receviing Narcan, but O2 sats remain ~90-92% on NRB

## 2021-08-08 NOTE — Progress Notes (Signed)
Notified provider and bedside nurse of need to order blood cultures.   Only single set of [facility] are ordered. Message sent asking to consider adding a second set of [facility]

## 2021-08-08 NOTE — ED Notes (Signed)
Trial on 4L started at this time

## 2021-08-08 NOTE — ED Notes (Signed)
Pt asked about his cell phone. No cell phone in his clothes. Pt has a shirt and a pair of pants. Placed in belongings bag.

## 2021-08-09 LAB — BASIC METABOLIC PANEL
Anion gap: 5 (ref 5–15)
BUN: 25 mg/dL — ABNORMAL HIGH (ref 8–23)
CO2: 29 mmol/L (ref 22–32)
Calcium: 8.7 mg/dL — ABNORMAL LOW (ref 8.9–10.3)
Chloride: 99 mmol/L (ref 98–111)
Creatinine, Ser: 0.96 mg/dL (ref 0.61–1.24)
GFR, Estimated: >60 mL/min (ref >60)
Glucose, Bld: 136 mg/dL — ABNORMAL HIGH (ref 70–99)
Potassium: 4 mmol/L (ref 3.5–5.1)
Sodium: 133 mmol/L — ABNORMAL LOW (ref 135–145)

## 2021-08-09 LAB — GLUCOSE, CAPILLARY
Glucose-Capillary: 102 mg/dL — ABNORMAL HIGH (ref 70–99)
Glucose-Capillary: 109 mg/dL — ABNORMAL HIGH (ref 70–99)
Glucose-Capillary: 123 mg/dL — ABNORMAL HIGH (ref 70–99)
Glucose-Capillary: 126 mg/dL — ABNORMAL HIGH (ref 70–99)
Glucose-Capillary: 137 mg/dL — ABNORMAL HIGH (ref 70–99)
Glucose-Capillary: 140 mg/dL — ABNORMAL HIGH (ref 70–99)
Glucose-Capillary: 99 mg/dL (ref 70–99)

## 2021-08-09 LAB — CBC WITH DIFFERENTIAL/PLATELET
Abs Immature Granulocytes: 0.03 10*3/uL (ref 0.00–0.07)
Basophils Absolute: 0 10*3/uL (ref 0.0–0.1)
Basophils Relative: 0 %
Eosinophils Absolute: 0.3 10*3/uL (ref 0.0–0.5)
Eosinophils Relative: 3 %
HCT: 39.8 % (ref 39.0–52.0)
Hemoglobin: 13.8 g/dL (ref 13.0–17.0)
Immature Granulocytes: 0 %
Lymphocytes Relative: 2 %
Lymphs Abs: 0.2 10*3/uL — ABNORMAL LOW (ref 0.7–4.0)
MCH: 31.7 pg (ref 26.0–34.0)
MCHC: 34.7 g/dL (ref 30.0–36.0)
MCV: 91.5 fL (ref 80.0–100.0)
Monocytes Absolute: 0.4 10*3/uL (ref 0.1–1.0)
Monocytes Relative: 5 %
Neutro Abs: 8 10*3/uL — ABNORMAL HIGH (ref 1.7–7.7)
Neutrophils Relative %: 90 %
Platelets: 321 10*3/uL (ref 150–400)
RBC: 4.35 MIL/uL (ref 4.22–5.81)
RDW: 13.5 % (ref 11.5–15.5)
WBC: 9 10*3/uL (ref 4.0–10.5)
nRBC: 0 % (ref 0.0–0.2)

## 2021-08-09 LAB — HEMOGLOBIN A1C
Hgb A1c MFr Bld: 6 % — ABNORMAL HIGH (ref 4.8–5.6)
Mean Plasma Glucose: 125.5 mg/dL

## 2021-08-09 LAB — MAGNESIUM: Magnesium: 2.5 mg/dL — ABNORMAL HIGH (ref 1.7–2.4)

## 2021-08-09 LAB — PHOSPHORUS: Phosphorus: 3.8 mg/dL (ref 2.5–4.6)

## 2021-08-09 LAB — HIV ANTIBODY (ROUTINE TESTING W REFLEX): HIV Screen 4th Generation wRfx: NONREACTIVE

## 2021-08-09 MED ORDER — IBUPROFEN 400 MG PO TABS: 600.0000 mg | ORAL_TABLET | Freq: Four times a day (QID) | ORAL | Status: DC | PRN

## 2021-08-09 MED ORDER — MORPHINE SULFATE (PF) 2 MG/ML IV SOLN: 2.0000 mg | INTRAVENOUS | Status: DC | PRN

## 2021-08-09 MED ORDER — KATE FARMS STANDARD 1.4 PO LIQD
325.0000 mL | Freq: Every day | ORAL | Status: DC
  Administered 2021-08-09 – 2021-08-13 (×18): 325 mL
  Filled 2021-08-09: qty 325

## 2021-08-09 MED ORDER — POLYETHYLENE GLYCOL 3350 17 G PO PACK
17.0000 g | PACK | Freq: Every day | ORAL | Status: DC
  Administered 2021-08-10: 09:00:00 17 g
  Filled 2021-08-09 (×2): qty 1

## 2021-08-09 MED ORDER — POLYETHYLENE GLYCOL 3350 17 G PO PACK
17.0000 g | PACK | Freq: Every day | ORAL | Status: DC
  Administered 2021-08-09: 10:00:00 17 g via ORAL

## 2021-08-09 MED ORDER — ALPRAZOLAM 0.25 MG PO TABS
0.2500 mg | ORAL_TABLET | Freq: Every evening | ORAL | Status: DC | PRN
  Administered 2021-08-09 – 2021-08-10 (×2): 0.25 mg via ORAL
  Filled 2021-08-09 (×3): qty 1

## 2021-08-09 MED ORDER — SODIUM CHLORIDE 0.9 % IV SOLN: INTRAVENOUS | Status: DC

## 2021-08-09 MED ORDER — KATE FARMS STANDARD 1.4 PO LIQD
325.0000 mL | Freq: Every day | ORAL | Status: DC
  Administered 2021-08-09: 16:00:00 325 mL via ORAL
  Filled 2021-08-09: qty 325

## 2021-08-09 MED ORDER — METHOCARBAMOL 750 MG PO TABS
750.0000 mg | ORAL_TABLET | Freq: Once | ORAL | Status: AC
  Administered 2021-08-09: 750 mg via ORAL
  Filled 2021-08-09: qty 1

## 2021-08-09 MED ORDER — PANTOPRAZOLE SODIUM 40 MG PO TBEC
40.0000 mg | DELAYED_RELEASE_TABLET | Freq: Every day | ORAL | Status: DC
  Administered 2021-08-09: 19:00:00 40 mg via ORAL

## 2021-08-09 MED ORDER — CHLORHEXIDINE GLUCONATE CLOTH 2 % EX PADS
6.0000 | MEDICATED_PAD | Freq: Every day | CUTANEOUS | Status: DC
  Administered 2021-08-09 – 2021-08-13 (×5): 6 via TOPICAL

## 2021-08-09 MED ORDER — ALPRAZOLAM 0.25 MG PO TABS
0.2500 mg | ORAL_TABLET | Freq: Once | ORAL | Status: AC
  Administered 2021-08-09: 06:00:00 0.25 mg via ORAL
  Filled 2021-08-09: qty 1

## 2021-08-09 MED ORDER — OXYCODONE HCL 5 MG PO TABS: 10.0000 mg | ORAL_TABLET | Freq: Four times a day (QID) | ORAL | Status: DC | PRN

## 2021-08-09 MED ORDER — TRAMADOL HCL 50 MG PO TABS
50.0000 mg | ORAL_TABLET | Freq: Four times a day (QID) | ORAL | Status: DC | PRN
  Administered 2021-08-09 – 2021-08-12 (×6): 50 mg via ORAL
  Filled 2021-08-09 (×6): qty 1

## 2021-08-09 MED ORDER — FREE WATER
120.0000 mL | Freq: Every day | Status: DC
  Administered 2021-08-09 – 2021-08-13 (×20): 120 mL

## 2021-08-09 NOTE — Plan of Care (Signed)
Continuous tube feed initiated via PEG tube. Xanax given via PEG tube x1 for anxiety. IVF initiated. Urine output adequate, brown in color. No bowel movement. Blood glucose monitored. Vitals stable on 3.5L via [place].  Problem: Nutrition: Goal: Adequate nutrition will be maintained Outcome: Progressing   Problem: Clinical Measurements: Goal: Ability to maintain clinical measurements within normal limits will improve Outcome: Progressing Goal: Will remain free from infection Outcome: Progressing Goal: Diagnostic test results will improve Outcome: Progressing Goal: Respiratory complications will improve Outcome: Progressing Goal: Cardiovascular complication will be avoided Outcome: Progressing   Problem: Health Behavior/Discharge Planning: Goal: Ability to manage health-related needs will improve Outcome: Progressing   Problem: Elimination: Goal: Will not experience complications related to bowel motility Outcome: Progressing Goal: Will not experience complications related to urinary retention Outcome: Progressing

## 2021-08-09 NOTE — Evaluation (Signed)
Physical Therapy Evaluation Patient Details Name: Ivan Garcia. MRN: [id number] DOB: 06-14-1956 Today's Date: 08/09/2021   History of Present Illness  Pt is a 65 y.o. M arriving to ED following a fall and hypoxemia & pneumonia. PMH includes esophogeal cancer, HTN, carpal tunnel syndrome, chronic pain. Pt iis NPO w/ feeding tube at baseline.  Clinical Impression  Pt alert in bed, oriented x 4, friend present throughout treatment session. PLOF includes independence with ADLs and ambulation. Pt recently received a corticosteroid injection for chronic carpal tunnel syndrome and now is unable to flex/ext RUE at elbow and shoulder. Pt is able to grip and abd/add fingers with some wrist mobility.   Pt required min-A for supine > sit to assist RUE, but is able to scoot to EOB. Transfers w/ RW & R platform attachment w/ min-A + 2 for safety, physical assist from bed height. Pt is able to step forward, backward w/ RW w/ min-A for RW management due to RUE involvement. Pt maintained SpO2 readings > 90% on 4L throughout tx. Discharge recommendations are SNF due to significant change from PLOF. Skilled PT intervention is indicated to address deficits in function, mobility, and to return to PLOF as able.      Follow Up Recommendations SNF;Supervision for mobility/OOB    Equipment Recommendations  Other (comment) (TBD next venue of care)    Recommendations for Other Services       Precautions / Restrictions Precautions Precautions: Fall Precaution Comments: PEG tube Restrictions Weight Bearing Restrictions: No      Mobility  Bed Mobility Overal bed mobility: Needs Assistance Bed Mobility: Supine to Sit     Supine to sit: Min assist     General bed mobility comments: min-A for RUE, pt able to scoot to EOB w/o bed rails    Transfers Overall transfer level: Needs assistance Equipment used: Right platform walker Transfers: Sit to/from Stand Sit to Stand: Min assist;+2  safety/equipment         General transfer comment: min-A for task initiation & RW stabilization  Ambulation/Gait Ambulation/Gait assistance: Min assist Gait Distance (Feet): 3 Feet   Gait Pattern/deviations: Step-to pattern;Decreased step length - left;Decreased step length - right;Trunk flexed     General Gait Details: Min-A for manuevering RW due to lack of functional mobility in RUE, no LOB or instability  Stairs            Wheelchair Mobility    Modified Rankin (Stroke Patients Only)       Balance Overall balance assessment: Needs assistance Sitting-balance support: Feet supported;Single extremity supported Sitting balance-Leahy Scale: Fair Sitting balance - Comments: Trunk lean left, unable to tolerate challenge statically Postural control: Left lateral lean Standing balance support: During functional activity;Bilateral upper extremity supported Standing balance-Leahy Scale: Fair Standing balance comment: Min-A for dynamic task, min-gaurd for static stance                             Pertinent Vitals/Pain Pain Assessment: Faces Faces Pain Scale: Hurts even more Pain Location: R wrist through forearm Pain Descriptors / Indicators: Aching;Tingling;Shooting Pain Intervention(s): Limited activity within patient's tolerance;Monitored during session;Repositioned    Home Living Family/patient expects to be discharged to:: Private residence Living Arrangements: Alone Available Help at Discharge: Family;Available PRN/intermittently (possible 1 wk, resides in [place]) Type of Home: House Home Access: Stairs to enter;Level entry     Home Layout: One level Home Equipment: Environmental consultant - 2 wheels;Cane - quad  Prior Function Level of Independence: Independent         Comments: Indep for ADLs, ambulation w/o AD     Hand Dominance   Dominant Hand: Right    Extremity/Trunk Assessment   Upper Extremity Assessment Upper Extremity Assessment: RUE  deficits/detail RUE Deficits / Details: Pt is able to grip, abd digts but is unable to flex/ext elbow or mobilze shoulder RUE Sensation: WNL    Lower Extremity Assessment Lower Extremity Assessment: RLE deficits/detail;LLE deficits/detail RLE Deficits / Details: MMT 4/5 hip flexion, knee ext RLE Sensation: WNL LLE Deficits / Details: MMT 4/5 hip flexion, knee ext LLE Sensation: WNL    Cervical / Trunk Assessment Cervical / Trunk Assessment: Kyphotic  Communication   Communication: No difficulties  Cognition Arousal/Alertness: Awake/alert Behavior During Therapy: WFL for tasks assessed/performed Overall Cognitive Status: Within Functional Limits for tasks assessed                                 General Comments: AOx4      General Comments      Exercises     Assessment/Plan    PT Assessment Patient needs continued PT services  PT Problem List Decreased strength;Decreased range of motion;Decreased activity tolerance;Decreased balance;Decreased mobility;Decreased coordination       PT Treatment Interventions Balance training;Gait training;Neuromuscular re-education;Stair training;Functional mobility training;Therapeutic activities;Therapeutic exercise    PT Goals (Current goals can be found in the Care Plan section)  Acute Rehab PT Goals Patient Stated Goal: To get stronger, get surgery on RUE PT Goal Formulation: With patient Time For Goal Achievement: 08/23/21 Potential to Achieve Goals: Good    Frequency Min 2X/week   Barriers to discharge Decreased caregiver support      Co-evaluation PT/OT/SLP Co-Evaluation/Treatment: Yes Reason for Co-Treatment: To address functional/ADL transfers PT goals addressed during session: Mobility/safety with mobility OT goals addressed during session: ADL's and self-care       AM-PAC PT "6 Clicks" Mobility  Outcome Measure Help needed turning from your back to your side while in a flat bed without using  bedrails?: A Little Help needed moving from lying on your back to sitting on the side of a flat bed without using bedrails?: A Little Help needed moving to and from a bed to a chair (including a wheelchair)?: A Little Help needed standing up from a chair using your arms (e.g., wheelchair or bedside chair)?: A Little Help needed to walk in hospital room?: A Lot Help needed climbing 3-5 steps with a railing? : A Lot 6 Click Score: 16    End of Session Equipment Utilized During Treatment: Gait belt;Oxygen Activity Tolerance: Patient tolerated treatment well Patient left: in chair (w/ OT) Nurse Communication: Mobility status PT Visit Diagnosis: Other abnormalities of gait and mobility (R26.89);Muscle weakness (generalized) (M62.81);History of falling (Z91.81);Other symptoms and signs involving the nervous system (R29.898)    Time: [id number] PT Time Calculation (min) (ACUTE ONLY): 49 min   Charges:              [employer], SPT

## 2021-08-09 NOTE — Progress Notes (Signed)
Initial Nutrition Assessment  DOCUMENTATION CODES:  Not applicable  INTERVENTION:  Recommend resuming pt's home TF regimen as follows: [place] 1.4, 5 cartons per day with 1 prosource TF daily (40kcal and 11g of protein per packet) Free water flush 4m of fluid before and after each bolus. If Na levels improve, increase volume being flushed. Consider flushing with normal saline versus free water This will provide 2315kcal, 111g of protein, 178107mof free water  NUTRITION DIAGNOSIS:  Increased nutrient needs related to cancer and cancer related treatments as evidenced by estimated needs.  GOAL:  Patient will meet greater than or equal to 90% of their needs  MONITOR:  TF tolerance, Skin, I & O's, Labs  REASON FOR ASSESSMENT:  Consult Enteral/tube feeding initiation and management  ASSESSMENT:  [age].o. male patient with hx of head and neck cancer (s/p radiation, currently receiving Keytruda and lenvatinib), HLD, HTN, and CKD3 presented to ED from home after family found him on the floor- unsure about how long pt was down. Pt has PEG in place, no oral intake at this time due to severe dysphagia. Followed by RD at oncology center.  Spoke with pt at bedside. Reports feeling improved this AM from admission. Confirmed pt's home regimen of 4-5 cartons of [employer]4 each day. Pt reports he no longer flushes with pedialyte. Instead, administers 1 syringe of water prior to feed and "2 or more" after the bolus. Pt reports good tolerance to this regimen and would like to resume in hospital rather than continue with the order that was placed overnight. (Jevity 1.2 @ 5544m). Discussed new order with RN.  Pt endorses weight loss recently, reports normal weight is 190 lbs. 5.6% weight loss in the last 3 months (6/15-9/5). No fat or muscle deficits noted during physical exam. Pt repots no changes in BM habits or GI discomfort at this time.   Nutritionally Relevant Medications: Scheduled Meds:   insulin aspart  0-9 Units Subcutaneous Q4H   [START ON 08/10/2021] polyethylene glycol  17 g Per Tube Daily   Continuous Infusions:  sodium chloride 100 mL/hr at 08/09/21 0617   cefTRIAXone (ROCEPHIN)  IV Stopped (08/08/21 1922)   feeding supplement (JEVITY 1.2 CAL) 1,000 mL (08/08/21 2000)   lactated ringers     metronidazole 500 mg (08/09/21 0934)   Labs Reviewed: Na 133 BUN 25 Mg 2.5 SBG ranges from 99-140 mg/dL over the last 24 hours HgbA1c 6.0% (9/6)  NUTRITION - FOCUSED PHYSICAL EXAM: Flowsheet Row Most Recent Value  Orbital Region No depletion  Upper Arm Region No depletion  Thoracic and Lumbar Region No depletion  Buccal Region No depletion  Temple Region No depletion  Clavicle Bone Region No depletion  Clavicle and Acromion Bone Region No depletion  Scapular Bone Region No depletion  Dorsal Hand No depletion  Patellar Region No depletion  Posterior Calf Region No depletion  Edema (RD Assessment) Mild  Hair Reviewed  Eyes Reviewed  Mouth Reviewed  Skin Reviewed  Nails Reviewed   Diet Order:   Diet Order             Diet NPO time specified  Diet effective now                   EDUCATION NEEDS:  No education needs have been identified at this time  Skin:  Skin Assessment: Reviewed RN Assessment  Last BM:  9/6  Height:  Ht Readings from Last 1 Encounters:  08/08/21 '5\' 10"'$  (1.778  m)    Weight:  Wt Readings from Last 1 Encounters:  08/08/21 85.8 kg    Ideal Body Weight:  75.5 kg  BMI:  Body mass index is 27.14 kg/m.  Estimated Nutritional Needs:  Kcal:  2200-2400 kcal/d Protein:  110-120 g/d Fluid:  2.2-2.4L/d   Ranell Patrick, RD, LDN Clinical Dietitian Pager on [place]

## 2021-08-09 NOTE — Progress Notes (Signed)
PROGRESS NOTE    Ivan Garcia.  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  On presentation he was found to be hypoxemic.  Patient has severe dysphagia and has been strictly n.p.o. and gets nutrition from the tube feed.  On presentation, he was tachycardic, blood pressure was stable, he required 5 L of oxygen for maintenance of saturation.  Lab work showed hyponatremia, elevated glucose, creatinine of 1.44.  COVID screen test negative.  Chest CT scan showed multilobar aspiration pneumonia.  Started on  antibiotics.  Assessment & Plan:   Active Problems:   Aspiration pneumonia (HCC)   Acute hypoxemic respiratory failure (HCC)   Sepsis (HCC)   Acute hypoxic respiratory failure secondary to multilobar aspiration pneumonia/severe sepsis: Met septic criteria with tachycardia, leukocytosis, lactic acidosis, AKI.  Required 5 L of oxygen for maintenance of saturation on admission. Started on broad-spectrum antibiotics with ceftriaxone and Flagyl which we will continue.  Respiratory status is improving.  We will continue to try to wean the oxygen.  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  Strictly n.p.o. at home. Follows with oncology as an outpatient.On lenvatinib and also on immunotherapy  AKI/hyponatremia: Secondary to dehydration, rhabdomyolysis.  Continue IV fluids.  Fall: Has problem with ambulation.  Had bruise on the forehead after the fall.  CT scan did not show any intracranial hemorrhage.  Continue monitoring.  PT/OT consultation.  Patient and family interested on rehab placement  History of  hypertension: On amlodipine at home, currently on hold  Hyperlipidemia: On Lipitor at home.  Hypothyroidism: On Synthyroid at home.  Uncontrolled diabetes type 2: Hyperglycemic in presentation.  Started on sliding scale insulin.  Monitor blood sugars  Right hand carpal tunnel syndrome/chronic pain syndrome: Follows with orthopedics.  Recently had a steroid shot for carpal Syndrome.  Impaired mobility on the right hand.  Continue pain medicines        DVT prophylaxis:Lovenox Code Status: Full Family Communication: Discussed with the sister face-to-face on 08/09/2021 Status is: Inpatient  Remains inpatient appropriate because:Inpatient level of care appropriate due to severity of illness  Dispo: The patient is from: Home              Anticipated d/c is to: Home versus skilled nursing facility              Patient currently is not medically stable to d/c.   Difficult to place patient No     Consultants: None  Procedures:None  Antimicrobials:  Anti-infectives (From admission, onward)    Start     Dose/Rate Route Frequency Ordered Stop   08/08/21 1615  cefTRIAXone (ROCEPHIN) 2 g in sodium chloride 0.9 % 100 mL IVPB        2 g 200 mL/hr over 30 Minutes Intravenous Every 24 hours 08/08/21 1610 08/13/21 1614   08/08/21 1615  metroNIDAZOLE (FLAGYL) IVPB 500 mg        500 mg 100 mL/hr over 60 Minutes Intravenous Every 8 hours 08/08/21 1610     08/08/21 1045  ceFEPIme (MAXIPIME) 2 g in sodium chloride 0.9 % 100 mL IVPB        2 g 200  mL/hr over 30 Minutes Intravenous  Once 08/08/21 1039 08/08/21 1122   08/08/21 1045  metroNIDAZOLE (FLAGYL) IVPB 500 mg        500 mg 100 mL/hr over 60 Minutes Intravenous  Once 08/08/21 1039 08/08/21 1218   08/08/21 1045  vancomycin (VANCOCIN) IVPB 1000 mg/200 mL premix        1,000 mg 200 mL/hr over 60 Minutes Intravenous  Once 08/08/21 1039 08/08/21 1333       Subjective: The patient seen and examined at the bedside this morning.   Hemodynamically stable.  He says he feels better today.  He is on 3 L of oxygen.  He denies any worsening cough or shortness of breath.  Objective: Vitals:   08/08/21 1935 08/09/21 0107 08/09/21 0311 08/09/21 0806  BP: (!) 138/95 (!) 144/97 (!) 115/91 133/77  Pulse: (!) 109 (!) 108 (!) 108 (!) 108  Resp: _0 Temp: 97.7 F (36.5 C) (!) 97.4 F (36.3 C) 97.9 F (36.6 C) 98.2 F (36.8 C)  TempSrc:      SpO2: 91% 93% 94%   Weight:      Height:        Intake/Output Summary (Last 24 hours) at 08/09/2021 0808 Last data filed at 08/09/2021 0644 Gross per 24 hour  Intake 2836.67 ml  Output 650 ml  Net 2186.67 ml   Filed Weights   08/08/21 1728  Weight: 85.8 kg    Examination:  General exam: Overall comfortable, not in distress,hoarse voice HEENT: PERRL Respiratory system:  no wheezes or crackles  Cardiovascular system: S1 & S2 heard, RRR.  Chemo-Port on the right chest Gastrointestinal system: Abdomen is nondistended, soft and nontender.PEG Central nervous system: Alert and oriented Extremities: No edema, no clubbing ,no cyanosis Skin: No rashes, no ulcers,no icterus      Data Reviewed: I have personally reviewed following labs and imaging studies  CBC: Recent Labs  Lab 08/08/21 0919 08/09/21 0610  WBC 19.8* 9.0  NEUTROABS 17.7* 8.0*  HGB 14.6 13.8  HCT 42.6 39.8  MCV 91.6 91.5  PLT 390 924   Basic Metabolic Panel: Recent Labs  Lab 08/08/21 0919 08/08/21 1335  NA 132* 126*  K 4.8 4.4  CL 96* 95*  CO2 28 24  GLUCOSE 141* 272*  BUN 35* 28*  CREATININE 1.87* 1.44*  CALCIUM 9.1 8.0*   GFR: Estimated Creatinine Clearance: 52.1 mL/min (A) (by C-G formula based on SCr of 1.44 mg/dL (H)). Liver Function Tests: Recent Labs  Lab 08/08/21 0919  AST 48*  ALT 24  ALKPHOS 76  BILITOT 0.9  PROT 7.3  ALBUMIN 4.0   Recent Labs  Lab 08/08/21 0919  LIPASE 101*   No results for input(s): AMMONIA in the last 168 hours. Coagulation Profile: No results  for input(s): INR, PROTIME in the last 168 hours. Cardiac Enzymes: Recent Labs  Lab 08/08/21 0919  CKTOTAL 1,739*   BNP (last 3 results) No results for input(s): PROBNP in the last 8760 hours. HbA1C: No results for input(s): HGBA1C in the last 72 hours. CBG: Recent Labs  Lab 08/08/21 1809 08/08/21 1930 08/09/21 0030 08/09/21 0308 08/09/21 0804  GLUCAP 128* 108* 109* 140* 123*   Lipid Profile: No results for input(s): CHOL, HDL, LDLCALC, TRIG, CHOLHDL, LDLDIRECT in the last 72 hours. Thyroid Function Tests: No results for input(s): TSH, T4TOTAL, FREET4, T3FREE, THYROIDAB in the last 72 hours. Anemia Panel: No results for input(s): VITAMINB12, FOLATE, FERRITIN, TIBC, IRON, RETICCTPCT in the last 72  hours. Sepsis Labs: Recent Labs  Lab 08/08/21 0919 08/08/21 1109  LATICACIDVEN 2.5* 1.7    Recent Results (from the past 240 hour(s))  Culture, blood (single)     Status: None (Preliminary result)   Collection Time: 08/08/21 11:09 AM   Specimen: BLOOD  Result Value Ref Range Status   Specimen Description BLOOD RIGHT ANTECUBITAL  Final   Special Requests   Final    BOTTLES DRAWN AEROBIC AND ANAEROBIC Blood Culture adequate volume   Culture   Final    NO GROWTH < 24 HOURS Performed at [facility], [address], [place], [place] [address]    Report Status PENDING  Incomplete  Resp Panel by RT-PCR (Flu A&B, Covid)     Status: None   Collection Time: 08/08/21 11:13 AM   Specimen: Nasopharyngeal(NP) swabs in vial transport medium  Result Value Ref Range Status   SARS Coronavirus 2 by RT PCR NEGATIVE NEGATIVE Final    Comment: (NOTE) SARS-CoV-2 target nucleic acids are NOT DETECTED.  The SARS-CoV-2 RNA is generally detectable in upper respiratory specimens during the acute phase of infection. The lowest concentration of SARS-CoV-2 viral copies this assay can detect is 138 copies/mL. A negative result does not preclude SARS-Cov-2 infection and should not be  used as the sole basis for treatment or other patient management decisions. A negative result may occur with  improper specimen collection/handling, submission of specimen other than nasopharyngeal swab, presence of viral mutation(s) within the areas targeted by this assay, and inadequate number of viral copies(<138 copies/mL). A negative result must be combined with clinical observations, patient history, and epidemiological information. The expected result is Negative.  Fact Sheet for Patients:  EntrepreneurPulse.com.au  Fact Sheet for Healthcare Providers:  IncredibleEmployment.be  This test is no t yet approved or cleared by the [place] FDA and  has been authorized for detection and/or diagnosis of SARS-CoV-2 by FDA under an Emergency Use Authorization (EUA). This EUA will remain  in effect (meaning this test can be used) for the duration of the COVID-19 declaration under Section 564(b)(1) of the Act, 21 U.S.C.section 360bbb-3(b)(1), unless the authorization is terminated  or revoked sooner.       Influenza A by PCR NEGATIVE NEGATIVE Final   Influenza B by PCR NEGATIVE NEGATIVE Final    Comment: (NOTE) The Xpert Xpress SARS-CoV-2/FLU/RSV plus assay is intended as an aid in the diagnosis of influenza from Nasopharyngeal swab specimens and should not be used as a sole basis for treatment. Nasal washings and aspirates are unacceptable for Xpert Xpress SARS-CoV-2/FLU/RSV testing.  Fact Sheet for Patients: EntrepreneurPulse.com.au  Fact Sheet for Healthcare Providers: IncredibleEmployment.be  This test is not yet approved or cleared by the [place] FDA and has been authorized for detection and/or diagnosis of SARS-CoV-2 by FDA under an Emergency Use Authorization (EUA). This EUA will remain in effect (meaning this test can be used) for the duration of the COVID-19 declaration under Section  564(b)(1) of the Act, 21 U.S.C. section 360bbb-3(b)(1), unless the authorization is terminated or revoked.  Performed at [facility], [address] [place], [place] [address]   Culture, blood (routine x 2)     Status: None (Preliminary result)   Collection Time: 08/08/21 11:31 AM   Specimen: BLOOD  Result Value Ref Range Status   Specimen Description BLOOD  LAV  Final   Special Requests   Final    BOTTLES DRAWN AEROBIC AND ANAEROBIC Blood Culture adequate volume   Culture  Final    NO GROWTH < 24 HOURS Performed at [facility], [address] [place], [place] [address]    Report Status PENDING  Incomplete         Radiology Studies: CT HEAD WO CONTRAST (5MM)  Result Date: 08/08/2021 CLINICAL DATA:  Found on floor by family. Personal history of esophageal cancer. Feeding tube. Mental status change. Hypoxia. EXAM: CT HEAD WITHOUT CONTRAST CT CERVICAL SPINE WITHOUT CONTRAST TECHNIQUE: Multidetector CT imaging of the head and cervical spine was performed following the standard protocol without intravenous contrast. Multiplanar CT image reconstructions of the cervical spine were also generated. COMPARISON:  MRI had without and with contrast 04/10/2021 FINDINGS: CT HEAD FINDINGS Brain: No acute infarct, hemorrhage, or mass lesion is present. No significant white matter lesions are present. The ventricles are of normal size. No significant extraaxial fluid collection is present. The brainstem and cerebellum are within normal limits. Vascular: No hyperdense vessel or unexpected calcification. Skull: Right supraorbital soft tissue swelling and hematoma is present. No underlying fracture is present. Calvarium otherwise within normal limits. No other significant extracranial soft tissue injury is present. Sinuses/Orbits: Mild mucosal thickening is present in the maxillary sinuses bilaterally. No air-fluid levels are present. The globes and orbits are within normal limits. CT  CERVICAL SPINE FINDINGS Alignment: No significant listhesis is present. Cervical lordosis preserved. Skull base and vertebrae: Craniocervical junction is within normal limits. Vertebral body heights are normal. Narrow acute or healing fractures are present. Soft tissues and spinal canal: No prevertebral fluid or swelling. No visible canal hematoma. Esophageal mass the thoracic inlet again noted. Edema along the right vocal cord improved. Right IJ Port-A-Cath in place. Disc levels: Uncovertebral spurring leads to moderate foraminal narrowing at C3-4 and C5-6, left greater than right. Upper chest: Lung apices are clear. IMPRESSION: 1. Right supraorbital soft tissue swelling and hematoma without underlying fracture. 2. Normal CT appearance of the brain. 3. No acute fracture or traumatic subluxation in the cervical spine. 4. Multilevel degenerative changes of the cervical spine as described. Electronically Signed   By: San Morelle M.D.   On: 08/08/2021 13:54   CT Angio Chest PE W and/or Wo Contrast  Result Date: 08/08/2021 CLINICAL DATA:  History metastatic or pharyngeal squamous carcinoma. Found on floor at home and hypoxic. Elevated white blood cell count, D-dimer and lipase. EXAM: CT ANGIOGRAPHY CHEST WITH CONTRAST TECHNIQUE: Multidetector CT imaging of the chest was performed using the standard protocol during bolus administration of intravenous contrast. Multiplanar CT image reconstructions and MIPs were obtained to evaluate the vascular anatomy. CONTRAST:  35m OMNIPAQUE IOHEXOL 350 MG/ML SOLN COMPARISON:  Prior CT of the chest on 04/01/2021 FINDINGS: Cardiovascular: The pulmonary arteries are adequately opacified. There is no evidence of pulmonary embolism. Central pulmonary arteries are normal in caliber. The thoracic aorta is normal in caliber. Stable heart size. Stable calcified coronary artery plaque. 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 No evidence of mediastinal or hilar lymphadenopathy. Increased prominence of some adjacent superior left axillary lymph nodes with the largest measuring approximately 1 cm in short axis. Lungs/Pleura: New extensive airspace consolidation and volume loss of the right lower lobe and right middle lobe with visible occlusive debris in the bronchus intermedius and scattered small air bronchograms in consolidated lung. Visible nearly occlusive debris in the left mainstem bronchus and debris in the left lower lobe bronchus with near  complete atelectasis of the left lower lobe. Focal airspace disease in the medial aspect of the right middle lobe and posterior left upper lobe. Pulmonary findings are consistent with bilateral pneumonia and highly suggestive of aspiration pneumonia given the extensive debris in the airways. No significant associated pleural fluid or evidence of pneumothorax. Known metastatic pulmonary nodule in the superior right upper lobe shows interval enlargement since the prior study in April. This previously measured 8 mm and now measures 13 x 13 mm. Metastatic mass in the inferior aspect of the right upper lobe previously measuring 1.2 x 1.3 cm shows enlargement and now measures approximately 2.8 x 2.9 cm. Previously noted right lower lobe pulmonary nodule is not visible on today study due to the extensive consolidation and volume loss of the right lower lobe. Musculoskeletal: No chest wall abnormality. No acute or significant osseous findings. Review of the MIP images confirms the above findings. IMPRESSION: 1. No evidence of pulmonary embolism. 2. New extensive airspace consolidation and volume loss of the right lower lobe, right middle lobe and left lower lobe with significant debris in the right bronchus intermedius, left mainstem bronchus and left lower lobe bronchus. Additional airspace disease in the right middle lobe and left  upper lobe. Findings are suggestive of bilateral aspiration pneumonia. 3. Progression of metastatic disease in the right lung with enlargement of superior right upper lobe and inferior right upper lobe nodules as noted above. The previously noted right lower lobe nodule cannot be assessed as it is obscured by consolidation of the right lower lobe. 4. 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 5. Increased prominence of left axillary lymph nodes which only measure 1 cm in short axis. Electronically Signed   By: Aletta Edouard M.D.   On: 08/08/2021 14:11   CT Cervical Spine Wo Contrast  Result Date: 08/08/2021 CLINICAL DATA:  Found on floor by family. Personal history of esophageal cancer. Feeding tube. Mental status change. Hypoxia. EXAM: CT HEAD WITHOUT CONTRAST CT CERVICAL SPINE WITHOUT CONTRAST TECHNIQUE: Multidetector CT imaging of the head and cervical spine was performed following the standard protocol without intravenous contrast. Multiplanar CT image reconstructions of the cervical spine were also generated. COMPARISON:  MRI had without and with contrast 04/10/2021 FINDINGS: CT HEAD FINDINGS Brain: No acute infarct, hemorrhage, or mass lesion is present. No significant white matter lesions are present. The ventricles are of normal size. No significant extraaxial fluid collection is present. The brainstem and cerebellum are within normal limits. Vascular: No hyperdense vessel or unexpected calcification. Skull: Right supraorbital soft tissue swelling and hematoma is present. No underlying fracture is present. Calvarium otherwise within normal limits. No other significant extracranial soft tissue injury is present. Sinuses/Orbits: Mild mucosal thickening is present in the maxillary sinuses bilaterally. No air-fluid levels are present. The globes and orbits are within normal limits. CT CERVICAL SPINE FINDINGS Alignment: No significant listhesis is present. Cervical lordosis  preserved. Skull base and vertebrae: Craniocervical junction is within normal limits. Vertebral body heights are normal. Narrow acute or healing fractures are present. Soft tissues and spinal canal: No prevertebral fluid or swelling. No visible canal hematoma. Esophageal mass the thoracic inlet again noted. Edema along the right vocal cord improved. Right IJ Port-A-Cath in place. Disc levels: Uncovertebral spurring leads to moderate foraminal narrowing at C3-4 and C5-6, left greater than right. Upper chest: Lung apices are clear. IMPRESSION: 1. Right supraorbital soft tissue swelling and hematoma without underlying fracture. 2. Normal CT appearance of the  brain. 3. No acute fracture or traumatic subluxation in the cervical spine. 4. Multilevel degenerative changes of the cervical spine as described. Electronically Signed   By: San Morelle M.D.   On: 08/08/2021 13:54   CT ABDOMEN PELVIS W CONTRAST  Result Date: 08/08/2021 CLINICAL DATA:  History of metastatic oropharyngeal squamous carcinoma. Found on floor at home and hypoxic. Elevated white blood cell count, D-dimer and lipase. EXAM: CT ABDOMEN AND PELVIS WITH CONTRAST TECHNIQUE: Multidetector CT imaging of the abdomen and pelvis was performed using the standard protocol following bolus administration of intravenous contrast. CONTRAST:  37m OMNIPAQUE IOHEXOL 350 MG/ML SOLN COMPARISON:  CT of the abdomen without contrast on 09/15/2020 and PET scan on 11/09/2020 FINDINGS: Hepatobiliary: Stable left lobe hepatic cyst. Gallbladder and bile ducts are unremarkable. Pancreas: Unremarkable. No pancreatic ductal dilatation or surrounding inflammatory changes. Spleen: Normal in size without focal abnormality. Adrenals/Urinary Tract: Stable right renal cyst. No hydronephrosis or calculi. No solid renal masses or adrenal masses. The bladder is unremarkable. Stomach/Bowel: Gastrostomy tube present with normal positioning within the lumen of the stomach. No evidence  of bowel obstruction, ileus or inflammation. No mass lesions are seen involving the bowel. Diverticulosis of the descending and sigmoid colon without evidence of diverticulitis. No free intraperitoneal air. Vascular/Lymphatic: No significant vascular findings are present. No enlarged abdominal or pelvic lymph nodes. Reproductive: Prostate is unremarkable. Other: No abdominal wall hernia or abnormality. No abdominopelvic ascites. Musculoskeletal: No acute or significant osseous findings. IMPRESSION: No acute findings in the abdomen or pelvis. Gastrostomy tube is present and in appropriate position. Electronically Signed   By: GAletta EdouardM.D.   On: 08/08/2021 14:22   DG Chest Portable 1 View  Result Date: 08/08/2021 CLINICAL DATA:  Low O2 saturation EXAM: PORTABLE CHEST 1 VIEW COMPARISON:  None FINDINGS: There is a chest port with catheter tip overlying the right atrium. Unchanged, enlarged cardiac silhouette. There are low lung volumes with bibasilar atelectasis and possible small effusions. Nodular opacities in the right lung consistent with known pulmonary nodules. No new focal airspace disease. No visible pneumothorax. Bilateral shoulder degenerative changes. No acute osseous abnormality. Thoracic spondylosis. IMPRESSION: Low lung volumes with bibasilar subsegmental atelectasis and probable small bilateral pleural effusions. Electronically Signed   By: JMaurine SimmeringM.D.   On: 08/08/2021 09:54        Scheduled Meds:  Chlorhexidine Gluconate Cloth  6 each Topical Daily   enoxaparin (LOVENOX) injection  40 mg Subcutaneous Q24H   insulin aspart  0-9 Units Subcutaneous Q4H   Continuous Infusions:  sodium chloride 100 mL/hr at 08/09/21 0617   cefTRIAXone (ROCEPHIN)  IV Stopped (08/08/21 1922)   feeding supplement (JEVITY 1.2 CAL) 1,000 mL (08/08/21 2000)   lactated ringers     metronidazole Stopped (08/09/21 0130)     LOS: 1 day    Time spent:35 mins. More than 50% of that time was spent in  counseling and/or coordination of care.      AShelly Coss MD [facility] P9/05/2021, 8:08 AM

## 2021-08-09 NOTE — Evaluation (Signed)
Occupational Therapy Evaluation Patient Details Name: Ivan Garcia. MRN: [id number] DOB: November 01, 1956 Today's Date: 08/09/2021    History of Present Illness Pt is a 65 y.o. M arriving to ED following a fall and hypoxemia & pneumonia. PMH includes esophogeal cancer, HTN, carpal tunnel syndrome, chronic pain. Pt is NPO w/ feeding tube at baseline.   Clinical Impression   Pt seen for OT/PT co-evaluation this date. Prior to admission, pt was independent in all ADLs and functional mobility, living in a 1-story home alone. Pt currently presents with RUE weakness, decreased balance, and decreased activity tolerance. Due to these current functional impairments, pt requires MAX A for bed-level LB dressing, MOD A for seated UB dressing, MIN A+2 for sit<>stand transfer with R platform walker, and MIN A for taking forward/backward steps with walker. Pt educated on the importance of using RUE as much as possible and provided with HEP program for RUE; pt and friend at bedside verbalized understanding however pt requested to be provided with print out of HEP at next session. Pt would benefit from additional skilled OT services to maximize return to PLOF and minimize risk of future falls, injury, caregiver burden, and readmission. Upon discharge, recommend SNF.        Follow Up Recommendations  SNF    Equipment Recommendations  Other (comment) (defer to next venue of care)       Precautions / Restrictions Precautions Precautions: Fall Precaution Comments: PEG tube Restrictions Weight Bearing Restrictions: No      Mobility Bed Mobility Overal bed mobility: Needs Assistance Bed Mobility: Supine to Sit     Supine to sit: Min assist     General bed mobility comments: min-A for RUE, pt able to scoot to EOB w/o bed rails    Transfers Overall transfer level: Needs assistance Equipment used: Right platform walker Transfers: Sit to/from Stand Sit to Stand: Min assist;+2 safety/equipment;+2  physical assistance         General transfer comment: min-A for task initiation & RW stabilization, requires verbal cues for safe placement with RW use    Balance Overall balance assessment: Needs assistance Sitting-balance support: Feet supported;Single extremity supported Sitting balance-Leahy Scale: Fair Sitting balance - Comments: left lateral lean present during dynamic sitting Postural control: Left lateral lean Standing balance support: During functional activity;Bilateral upper extremity supported Standing balance-Leahy Scale: Fair Standing balance comment: Min-A for dynamic task, min-guard for static stance                           ADL either performed or assessed with clinical judgement   ADL Overall ADL's : Needs assistance/impaired     Grooming: Wash/dry face;Set up;Sitting;Supervision/safety           Upper Body Dressing : Moderate assistance;Sitting Upper Body Dressing Details (indicate cue type and reason): to don/doff hospital gown via hemi-dressing technique Lower Body Dressing: Maximal assistance;Bed level Lower Body Dressing Details (indicate cue type and reason): to don socks             Functional mobility during ADLs: Minimal assistance;Rolling walker (to take forward/backward steps with platform walker)       Vision Baseline Vision/History: 1 Wears glasses              Pertinent Vitals/Pain Pain Assessment: Faces Faces Pain Scale: Hurts even more Pain Location: R wrist through forearm Pain Descriptors / Indicators: Aching;Tingling;Shooting Pain Intervention(s): Limited activity within patient's tolerance;Monitored during session;Repositioned  Hand Dominance Right   Extremity/Trunk Assessment Upper Extremity Assessment Upper Extremity Assessment: RUE deficits/detail RUE Deficits / Details: Pt is able to grip, flex/exten wrist, but is unable to flex/ext elbow or flex shoulder RUE Sensation: WNL   Lower Extremity  Assessment Lower Extremity Assessment: Defer to PT evaluation RLE Deficits / Details: MMT 4/5 hip flexion, knee ext RLE Sensation: WNL LLE Deficits / Details: MMT 4/5 hip flexion, knee ext LLE Sensation: WNL   Cervical / Trunk Assessment Cervical / Trunk Assessment: Kyphotic   Communication Communication Communication: No difficulties   Cognition Arousal/Alertness: Awake/alert Behavior During Therapy: WFL for tasks assessed/performed Overall Cognitive Status: Within Functional Limits for tasks assessed                                 General Comments: AOx4. Pt reporting having some difficuly remembering x4 exercises provided in HEP   General Comments       Exercises General Exercises - Upper Extremity Shoulder Flexion: AAROM;Right;10 reps;Standing Elbow Flexion: AAROM;Right;10 reps;Seated Elbow Extension: AAROM;Right;10 reps;Seated Wrist Flexion: AROM;Right;10 reps;Seated Wrist Extension: AROM;Right;10 reps;Seated Digit Composite Flexion: AROM;Right;10 reps;Seated Other Exercises Other Exercises: Educated pt on hemi-dressing strategie for UB dressing Other Exercises: HEP provided consisting of AAROM shoulder flexion and elbow flex/ext, and AROM of wrist and digits; pt verbalized difficutly remembering all exercises and would benefit from print out of HEP next session   Shoulder Instructions      Home Living Family/patient expects to be discharged to:: Private residence Living Arrangements: Alone Available Help at Discharge: Family;Available PRN/intermittently (possible 1 wk, resides in [place]) Type of Home: House Home Access: Stairs to enter;Level entry     Home Layout: One level     Bathroom Shower/Tub: Occupational psychologist: Standard     Home Equipment: Environmental consultant - 2 wheels;Cane - quad          Prior Functioning/Environment Level of Independence: Independent        Comments: Indep for ADLs, ambulation w/o AD        OT Problem  List: Decreased strength;Decreased range of motion;Decreased activity tolerance;Impaired balance (sitting and/or standing);Impaired UE functional use;Pain      OT Treatment/Interventions: Self-care/ADL training;Therapeutic exercise;Energy conservation;DME and/or AE instruction;Splinting;Therapeutic activities;Patient/family education;Balance training    OT Goals(Current goals can be found in the care plan section) Acute Rehab OT Goals Patient Stated Goal: to regain independence OT Goal Formulation: With patient Time For Goal Achievement: 08/23/21 Potential to Achieve Goals: Good ADL Goals Pt Will Perform Grooming: with modified independence;sitting Pt Will Perform Upper Body Dressing: with modified independence;sitting Pt Will Transfer to Toilet: with supervision;stand pivot transfer;bedside commode  OT Frequency: Min 1X/week           Co-evaluation PT/OT/SLP Co-Evaluation/Treatment: Yes Reason for Co-Treatment: For patient/therapist safety;To address functional/ADL transfers PT goals addressed during session: Mobility/safety with mobility;Proper use of DME OT goals addressed during session: ADL's and self-care      AM-PAC OT "6 Clicks" Daily Activity     Outcome Measure Help from another person eating meals?: A Little Help from another person taking care of personal grooming?: A Little Help from another person toileting, which includes using toliet, bedpan, or urinal?: A Lot Help from another person bathing (including washing, rinsing, drying)?: A Lot Help from another person to put on and taking off regular upper body clothing?: A Lot Help from another person to put on and taking off regular lower  body clothing?: A Lot 6 Click Score: 14   End of Session Equipment Utilized During Treatment: Gait belt;Other (comment) (R platform walker) Nurse Communication: Mobility status;Other (comment) (limited RUE movement)  Activity Tolerance: Patient tolerated treatment well Patient  left: in chair;with call bell/phone within reach;with bed alarm set;with family/visitor present  OT Visit Diagnosis: Unsteadiness on feet (R26.81);Pain Pain - Right/Left: Right Pain - part of body: Arm                Time: 1425-1514 OT Time Calculation (min): 49 min Charges:  OT General Charges $OT Visit: 1 Visit OT Evaluation $OT Eval Moderate Complexity: 1 Mod OT Treatments $Self Care/Home Management : 8-22 mins $Therapeutic Activity: 8-22 mins  Fredirick Maudlin, OTR/L [place]

## 2021-08-10 ENCOUNTER — Inpatient Hospital Stay: Payer: Medicare Other

## 2021-08-10 LAB — GLUCOSE, CAPILLARY
Glucose-Capillary: 111 mg/dL — ABNORMAL HIGH (ref 70–99)
Glucose-Capillary: 116 mg/dL — ABNORMAL HIGH (ref 70–99)
Glucose-Capillary: 117 mg/dL — ABNORMAL HIGH (ref 70–99)
Glucose-Capillary: 118 mg/dL — ABNORMAL HIGH (ref 70–99)
Glucose-Capillary: 132 mg/dL — ABNORMAL HIGH (ref 70–99)
Glucose-Capillary: 77 mg/dL (ref 70–99)

## 2021-08-10 LAB — CBC WITH DIFFERENTIAL/PLATELET
Abs Immature Granulocytes: 0.03 10*3/uL (ref 0.00–0.07)
Basophils Absolute: 0.1 10*3/uL (ref 0.0–0.1)
Basophils Relative: 1 %
Eosinophils Absolute: 0.1 10*3/uL (ref 0.0–0.5)
Eosinophils Relative: 1 %
HCT: 37.3 % — ABNORMAL LOW (ref 39.0–52.0)
Hemoglobin: 12.9 g/dL — ABNORMAL LOW (ref 13.0–17.0)
Immature Granulocytes: 0 %
Lymphocytes Relative: 3 %
Lymphs Abs: 0.3 10*3/uL — ABNORMAL LOW (ref 0.7–4.0)
MCH: 31.6 pg (ref 26.0–34.0)
MCHC: 34.6 g/dL (ref 30.0–36.0)
MCV: 91.4 fL (ref 80.0–100.0)
Monocytes Absolute: 0.5 10*3/uL (ref 0.1–1.0)
Monocytes Relative: 5 %
Neutro Abs: 8.7 10*3/uL — ABNORMAL HIGH (ref 1.7–7.7)
Neutrophils Relative %: 90 %
Platelets: 306 10*3/uL (ref 150–400)
RBC: 4.08 MIL/uL — ABNORMAL LOW (ref 4.22–5.81)
RDW: 14 % (ref 11.5–15.5)
WBC: 9.6 10*3/uL (ref 4.0–10.5)
nRBC: 0 % (ref 0.0–0.2)

## 2021-08-10 LAB — BASIC METABOLIC PANEL
Anion gap: 7 (ref 5–15)
BUN: 21 mg/dL (ref 8–23)
CO2: 27 mmol/L (ref 22–32)
Calcium: 8.1 mg/dL — ABNORMAL LOW (ref 8.9–10.3)
Chloride: 99 mmol/L (ref 98–111)
Creatinine, Ser: 0.7 mg/dL (ref 0.61–1.24)
GFR, Estimated: >60 mL/min (ref >60)
Glucose, Bld: 98 mg/dL (ref 70–99)
Potassium: 4.1 mmol/L (ref 3.5–5.1)
Sodium: 133 mmol/L — ABNORMAL LOW (ref 135–145)

## 2021-08-10 IMAGING — DX DG WRIST 2V*R*
2 series · 2 of 2 positions shown · non-contrast
Comparison: None.

CLINICAL DATA: Hand and wrist soreness and swelling

EXAM:
RIGHT WRIST - 2 VIEW; RIGHT HAND - 2 VIEW

[wrist ap]
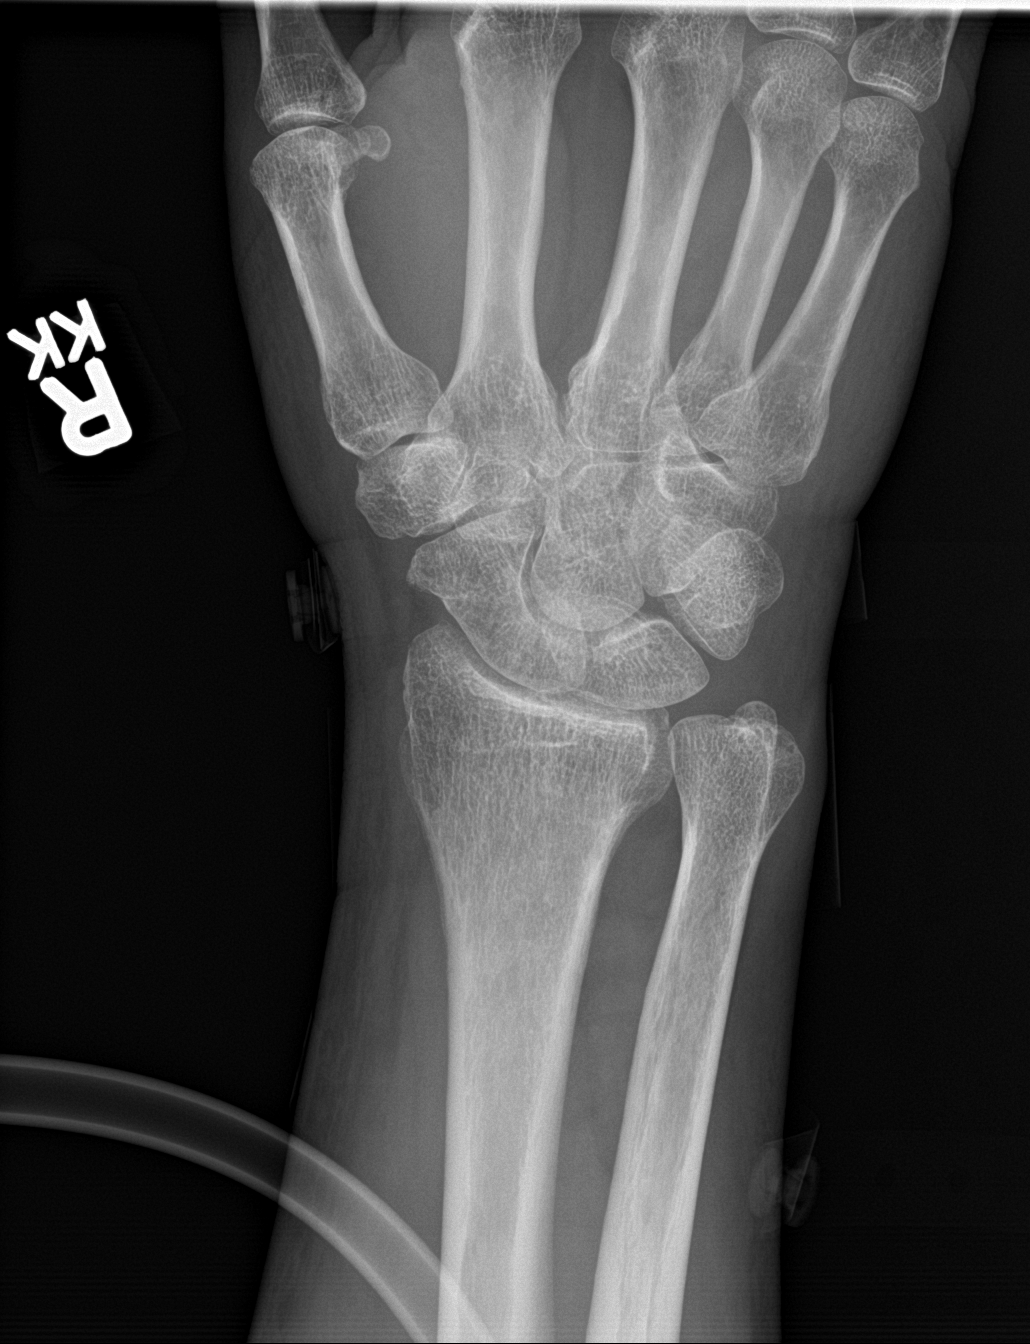

[wrist lat]
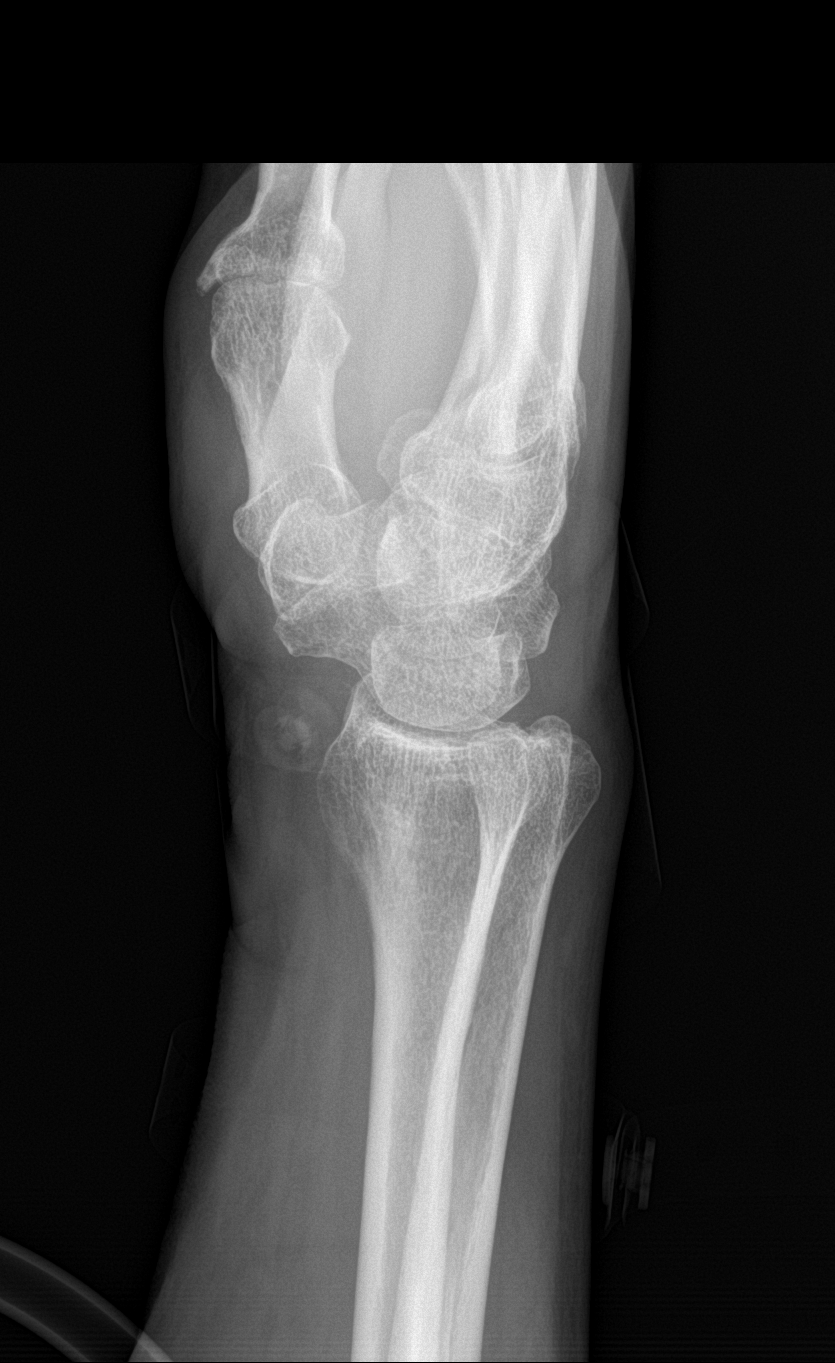

[2 of 2 positions shown; findings below may reference images not displayed]

FINDINGS: Wrist: There is no acute fracture or dislocation. There is minimal
positive ulnar variance. Alignment is otherwise normal. The joint
spaces are preserved. The soft tissues are unremarkable.

Hand: There is no acute fracture or dislocation. Alignment is
normal. There is degenerative change at the thumb MCP joint. The
joint spaces are otherwise preserved. The soft tissues are
unremarkable.
IMPRESSION: Mild degenerative change at the thumb MCP joint. Otherwise,
unremarkable hand and wrist radiographs.

## 2021-08-10 IMAGING — MR MR HEAD W/O CM
11 of 12 series · 39 of 48 positions shown · non-contrast
Comparison: Prior study from [DATE].

CLINICAL DATA: Initial evaluation for neuro deficit, stroke
suspected, cervical radiculopathy.

EXAM:
MRI HEAD WITHOUT CONTRAST
TECHNIQUE: Multiplanar, multiecho pulse sequences of the brain and surrounding
structures were obtained without intravenous contrast.

[Series 5: ax dwi_tracew · axial · 3.0mm · 0.65mm/px · z∈[-52,+101]mm · 4 of 48 slices shown]
[im 1/48]
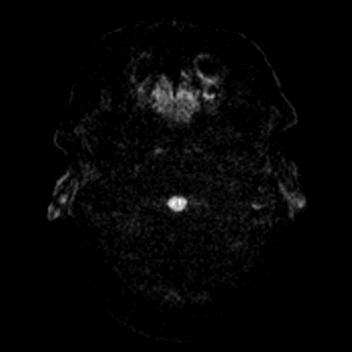
[im 16/48]
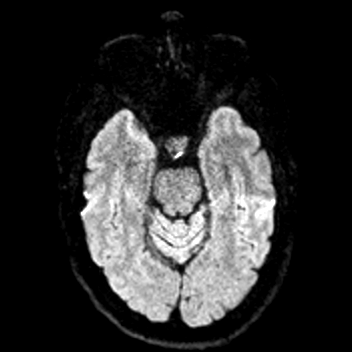
[im 32/48]
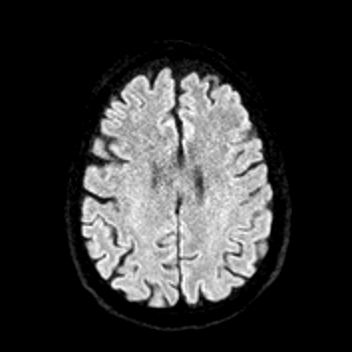
[im 48/48]
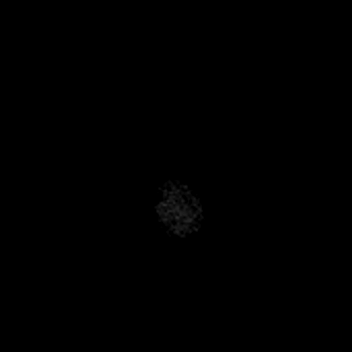

[Series 6: ax dwi_adc · axial · 3.0mm · 0.65mm/px · z∈[-52,+101]mm · 4 of 48 slices shown]
[im 1/48]
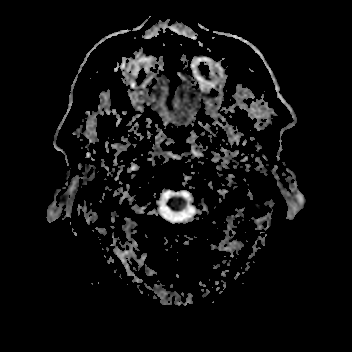
[im 16/48]
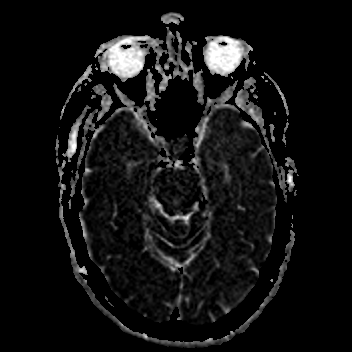
[im 32/48]
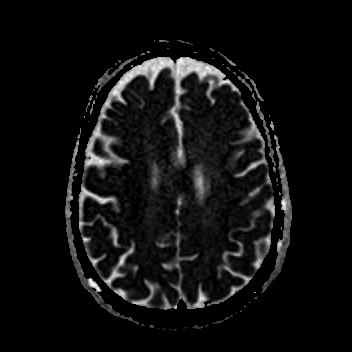
[im 48/48]
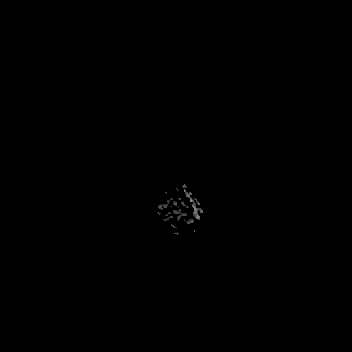

[Series 7: cor dwi_tracew · coronal · 5.0mm · 0.65mm/px · 3 of 40 slices shown]
[im 1/40]
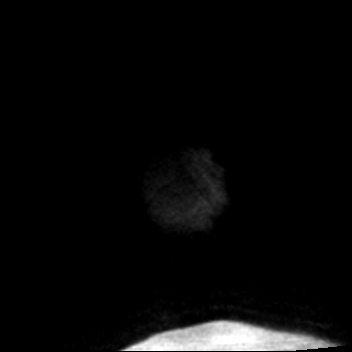
[im 20/40]
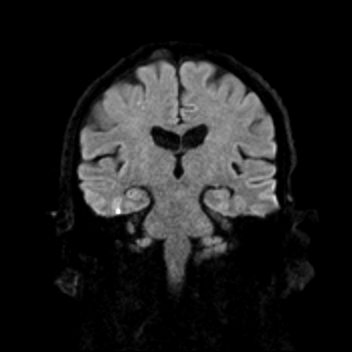
[im 40/40]
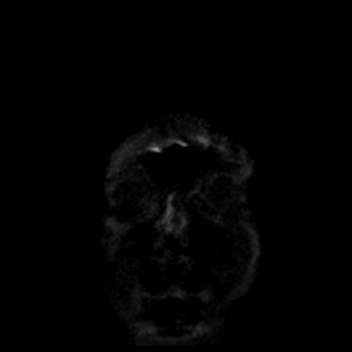

[Series 8: cor dwi_adc · coronal · 5.0mm · 0.65mm/px · 3 of 40 slices shown]
[im 1/40]
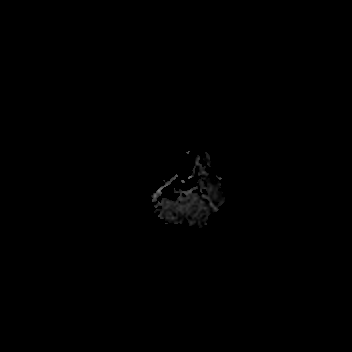
[im 20/40]
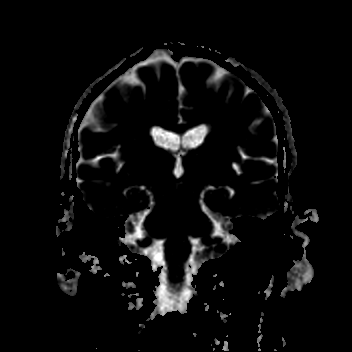
[im 40/40]
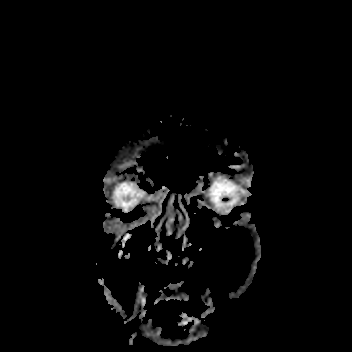

[Series 9: T1 · sagittal · 5.0mm · 0.62mm/px · 2 of 23 slices shown (1 of 2)]
[im 1/23]
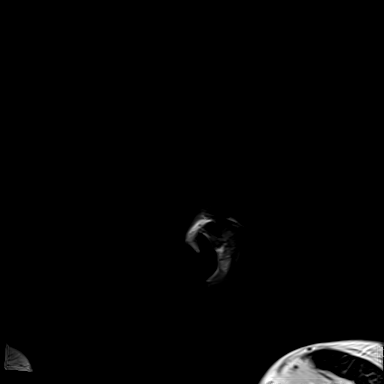
[im 23/23]
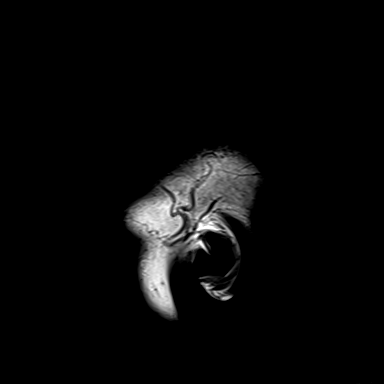

[Series 10: T2 · axial · 5.0mm · 0.45mm/px · z∈[-53,+100]mm · 2 of 27 slices shown (1 of 2)]
[im 1/27]
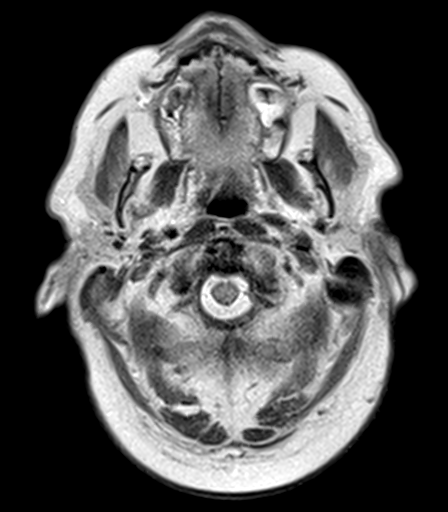
[im 27/27]
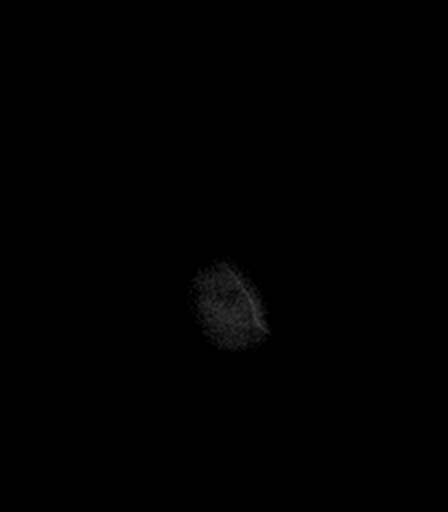

[Series 11: mag_images · axial · 3.0mm · 0.90mm/px · z∈[-63,+111]mm · 4 of 60 slices shown]
[im 1/60]
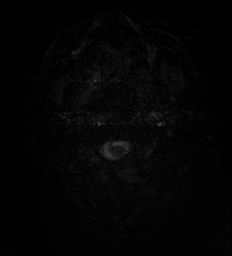
[im 20/60]
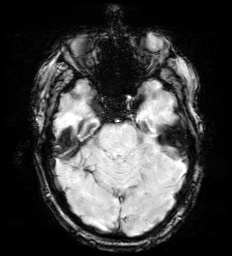
[im 40/60]
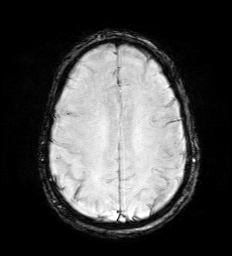
[im 60/60]
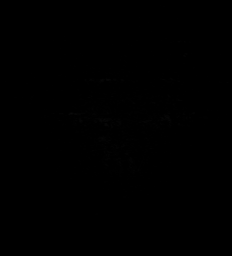

[Series 12: pha_images · axial · 3.0mm · 0.90mm/px · z∈[-63,+43]mm · 3 of 56 slices shown]
[im 1/56]
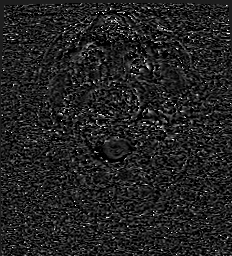
[im 19/56]
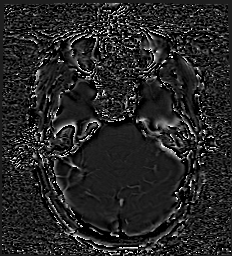
[im 37/56]
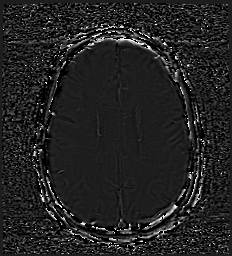

[Series 15: FLAIR · axial · 3.0mm · 0.53mm/px · z∈[-56,+103]mm · 4 of 55 slices shown]
[im 1/55]
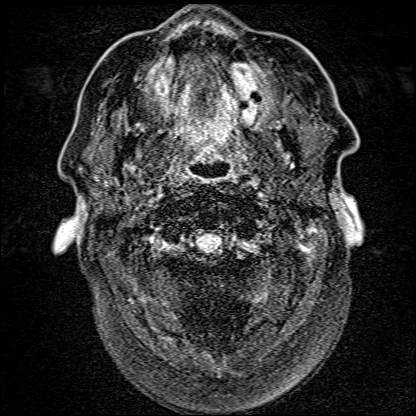
[im 19/55]
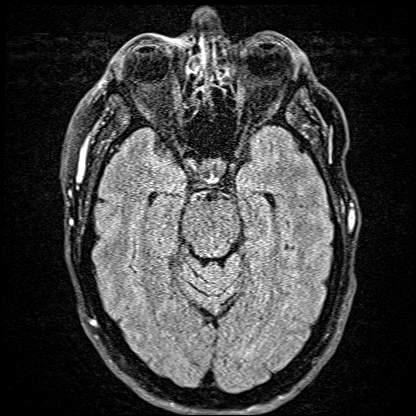
[im 37/55]
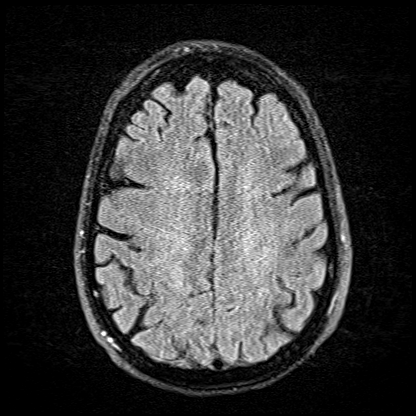
[im 55/55]
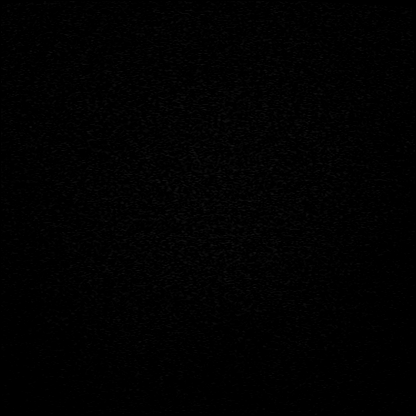

[Series 16: T2 · coronal · 5.0mm · 0.45mm/px · 2 of 31 slices shown (2 of 2)]
[im 1/31]
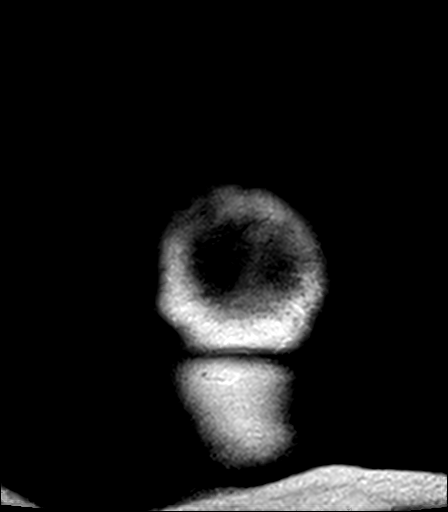
[im 31/31]
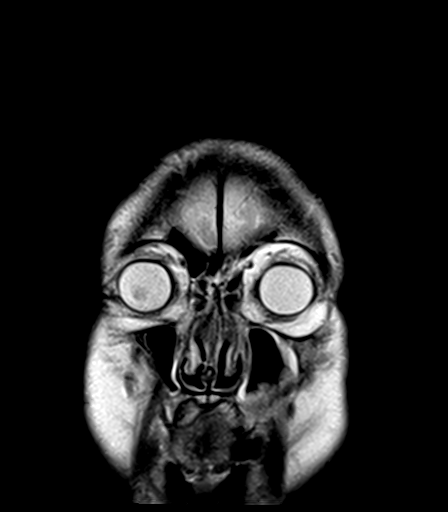

[Series 17: T1 · axial · 1.0mm · 0.98mm/px · z∈[-52,+104]mm · 8 of 160 slices shown (2 of 2)]
[im 1/160]
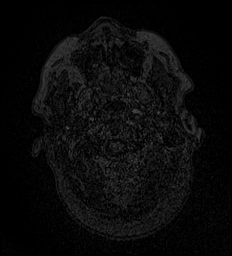
[im 29/160]
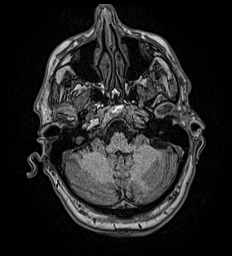
[im 44/160]
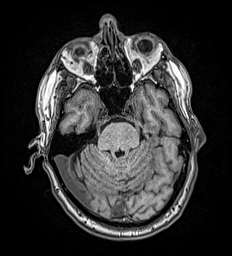
[im 73/160]
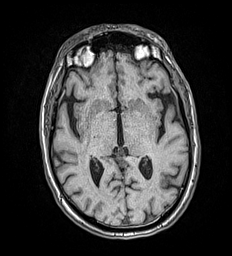
[im 87/160]
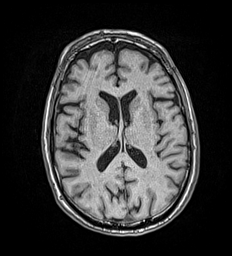
[im 116/160]
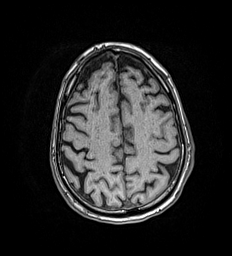
[im 131/160]
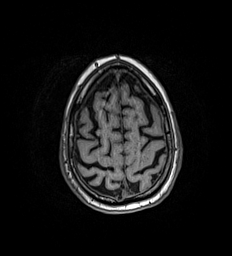
[im 160/160]
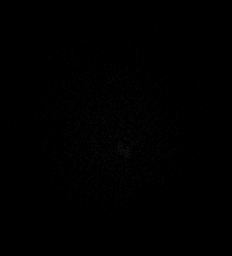

[39 of 48 positions shown; findings below may reference images not displayed]

FINDINGS: MRI HEAD FINDINGS

Brain: Cerebral volume within normal limits. No significant cerebral
white matter disease for age.

There are 2 subtle adjacent punctate foci of diffusion abnormality
involving the subcortical aspect of the posterior right frontal
region (series 5, images 36, 35), suspicious for tiny acute to early
subacute ischemic infarcts. No associated hemorrhage or mass effect.
No other diffusion abnormality to suggest acute or subacute
ischemia. Gray-white matter differentiation otherwise maintained. No
encephalomalacia to suggest chronic cortical infarction elsewhere
within the brain. No other evidence for acute or chronic
intracranial hemorrhage.

No mass lesion, midline shift or mass effect. No hydrocephalus or
extra-axial fluid collection. Pituitary gland and suprasellar region
within normal limits. Midline structures intact.

Vascular: There is asymmetric FLAIR signal with intermediate T1
signal intensity involving the right transverse and sigmoid sinus
(series 15, image 14). While this finding is favored to reflect
slow/sluggish flow, possible thrombus is difficult to exclude. Major
intracranial vascular flow voids are otherwise maintained.

Skull and upper cervical spine: Craniocervical junction within
normal limits. Bone marrow signal intensity normal. No focal marrow
replacing lesion. No scalp soft tissue abnormality.

Sinuses/Orbits: Globes and orbital soft tissues within normal
limits. Mild scattered mucosal thickening noted within the ethmoidal
air cells and maxillary sinuses. Paranasal sinuses are otherwise
clear. Small right mastoid effusion noted, of doubtful significance.
Inner ear structures grossly normal.

Other: None.
IMPRESSION: 1. Two subtle adjacent punctate foci of diffusion abnormality
involving the subcortical aspect of the posterior right frontal
region, suspicious for tiny acute to early subacute ischemic
infarcts. No associated hemorrhage or mass effect.
2. Asymmetric FLAIR signal intensity involving the right transverse
and sigmoid sinus. While this finding is favored to reflect
slow/sluggish flow, possible thrombus is difficult to exclude.
Further assessment with dedicated MRV, with and without contrast,
suggested for further evaluation.
3. Otherwise normal brain MRI for age.

## 2021-08-10 IMAGING — MR MR CERVICAL SPINE W/O CM
5 series · 35 of 48 positions shown · non-contrast
Comparison: Prior CT of the neck from [DATE].

CLINICAL DATA: Initial evaluation for cervical radiculopathy.
History of oropharyngeal squamous cell carcinoma.

EXAM:
MRI CERVICAL SPINE WITHOUT CONTRAST
TECHNIQUE: Multiplanar, multisequence MR imaging of the cervical spine was
performed. No intravenous contrast was administered.

[Series 26: T2 · sagittal · 3.0mm · 0.62mm/px · 6 of 15 slices shown (1 of 2)]
[im 1/15]
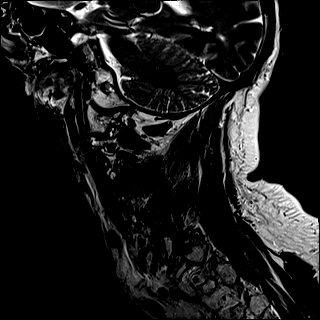
[im 3/15]
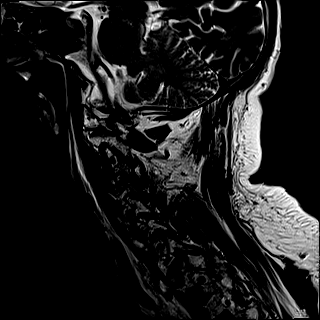
[im 6/15]
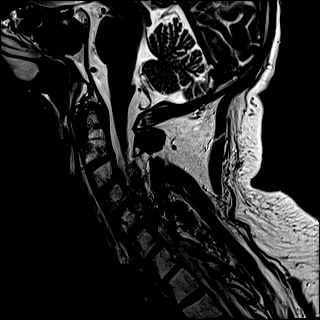
[im 9/15]
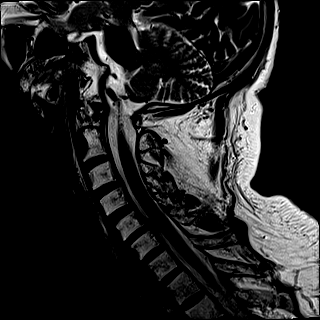
[im 12/15]
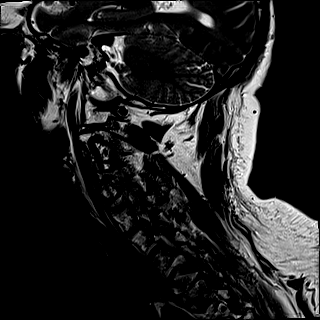
[im 15/15]
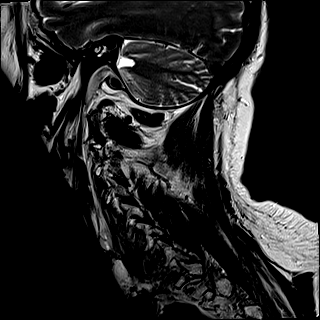

[Series 27: FLAIR · sagittal · 3.0mm · 0.78mm/px · 7 of 15 slices shown]
[im 1/15]
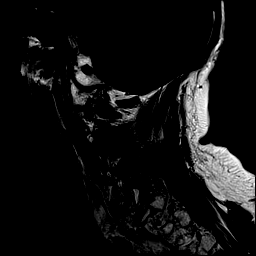
[im 3/15]
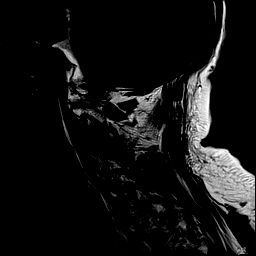
[im 5/15]
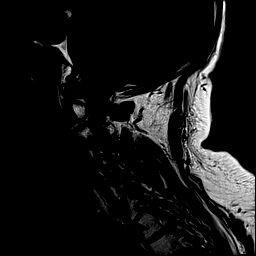
[im 8/15]
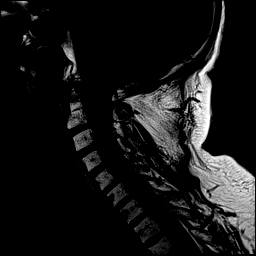
[im 10/15]
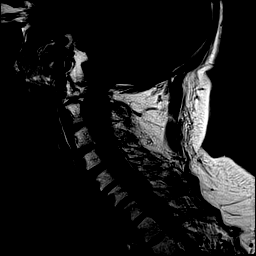
[im 12/15]
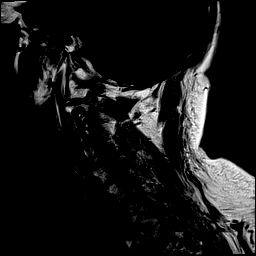
[im 15/15]
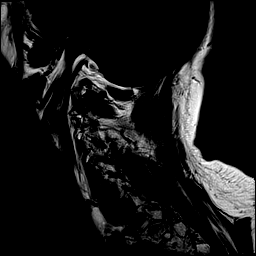

[Series 28: STIR · sagittal · 3.0mm · 0.62mm/px · 7 of 15 slices shown]
[im 1/15]
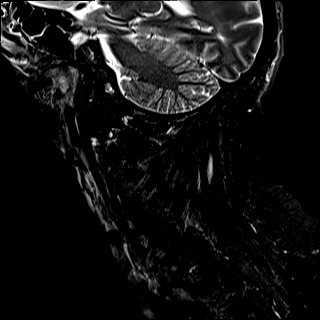
[im 3/15]
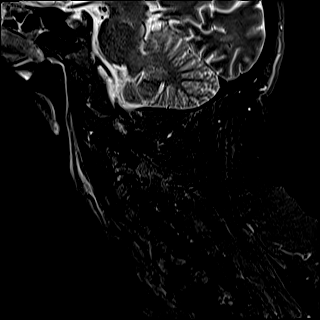
[im 5/15]
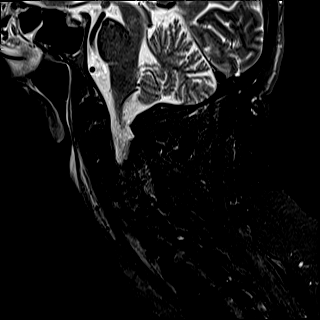
[im 8/15]
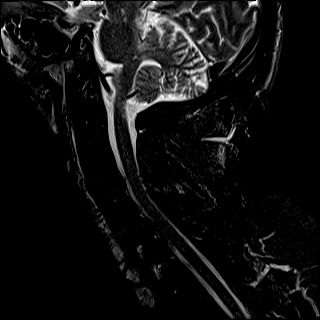
[im 10/15]
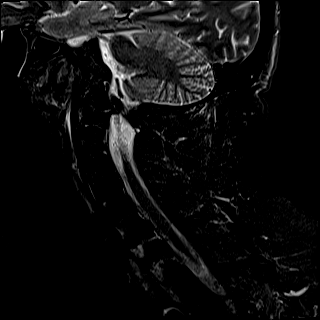
[im 12/15]
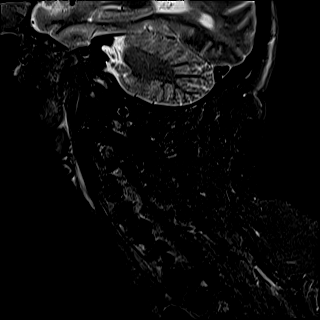
[im 15/15]
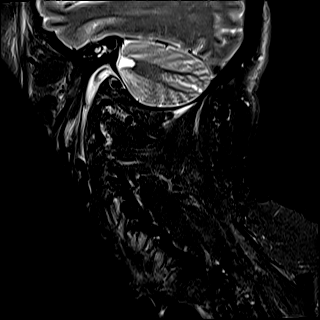

[Series 29: T2 · axial · 3.0mm · 0.70mm/px · z∈[-230,-145]mm · 8 of 29 slices shown (2 of 2)]
[im 1/29]
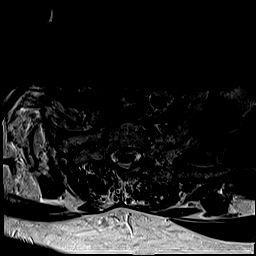
[im 5/29]
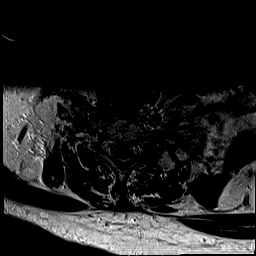
[im 9/29]
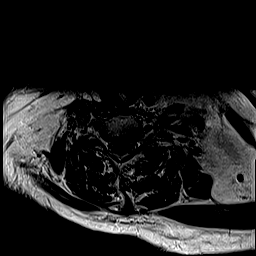
[im 13/29]
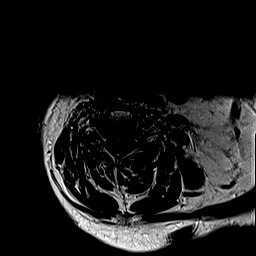
[im 16/29]
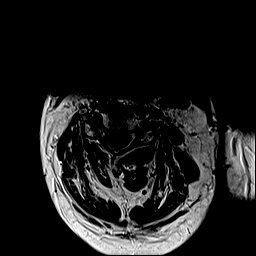
[im 20/29]
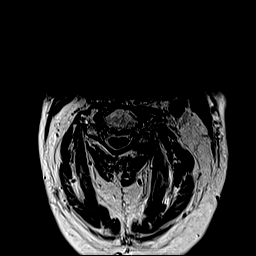
[im 24/29]
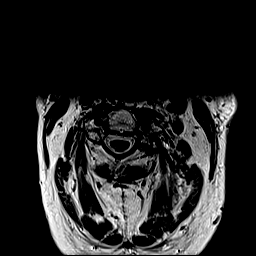
[im 29/29]
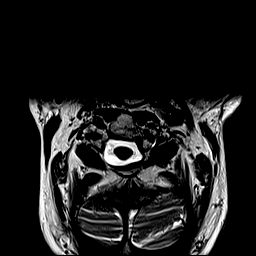

[Series 30: ax mpgr · axial · 3.0mm · 0.35mm/px · z∈[-230,-160]mm · 7 of 29 slices shown]
[im 1/29]
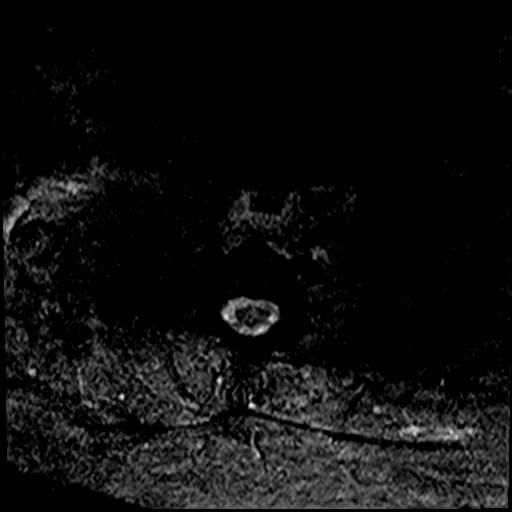
[im 5/29]
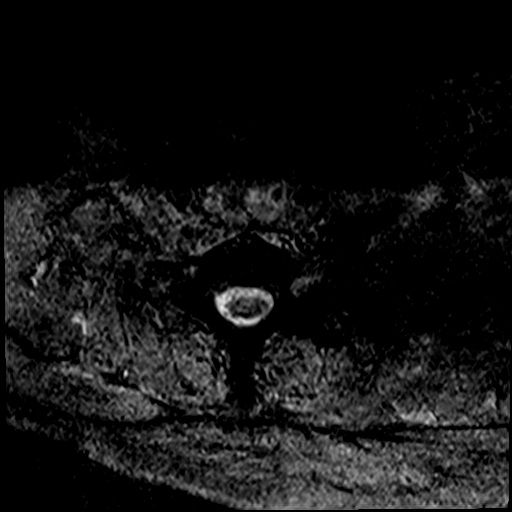
[im 9/29]
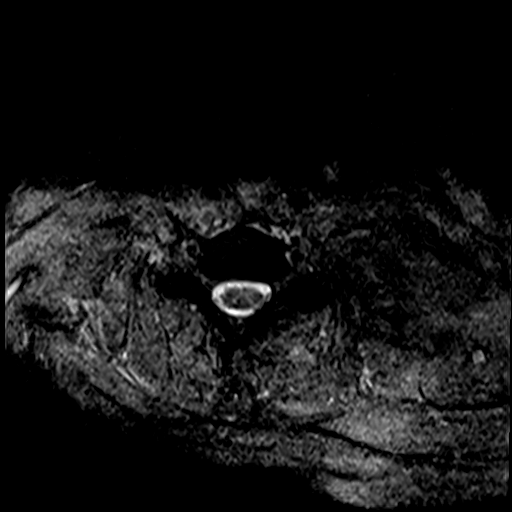
[im 13/29]
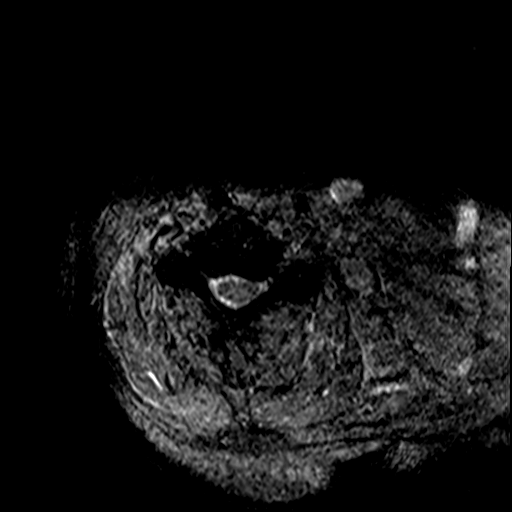
[im 16/29]
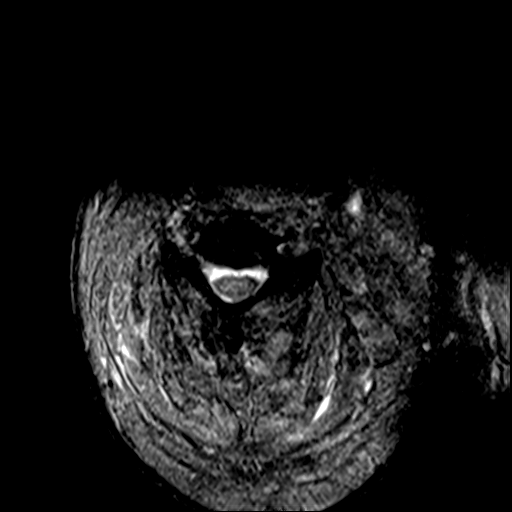
[im 20/29]
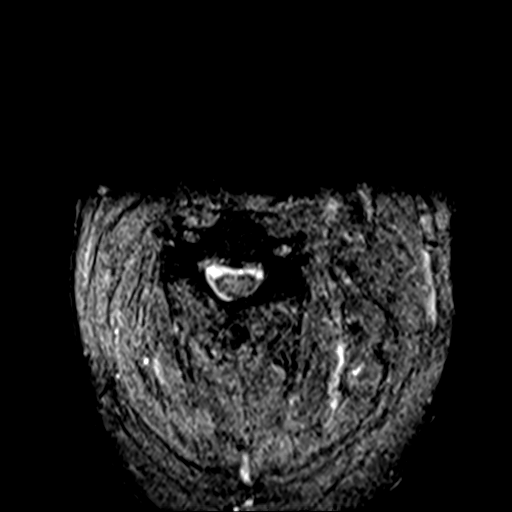
[im 24/29]
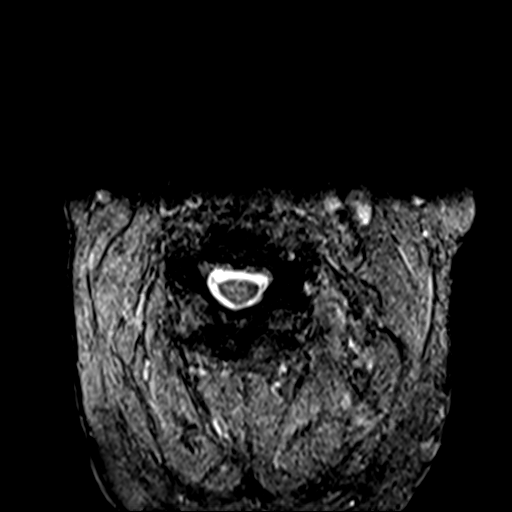

[35 of 48 positions shown; findings below may reference images not displayed]

FINDINGS: Alignment: Straightening of the normal cervical lordosis. No
listhesis.

Vertebrae: Vertebral body height maintained without acute or chronic
fracture. Suspected postradiation changes with possible fatty marrow
conversion within the visualized osseous structures. Mild marrow
edema involving the anterior aspects of the C4 through C7 vertebral
bodies could be post treatment related as well. No discrete or
worrisome osseous lesions. Facet arthrosis with associated marrow
edema present about the left C3-4 facet, which could contribute to
neck pain.

Cord: Normal signal and morphology.

Posterior Fossa, vertebral arteries, paraspinal tissues:
Craniocervical junction within normal limits. Irregular appearance
of the visualized hypopharynx with layering retropharyngeal effusion
and right paraesophageal nodal conglomerate, grossly similar to
prior neck CT. Normal flow voids seen within the vertebral arteries
bilaterally.

Disc levels:

C2-C3: Mild left eccentric disc bulge with left-sided uncinate
spurring. Mild left-sided facet hypertrophy. No spinal stenosis.
Foramina remain patent.

C3-C4: Broad-based posterior disc osteophyte complex flattens and
partially effaces the ventral thecal sac, asymmetric to the left.
Mild left-sided spinal stenosis without cord impingement.
Superimposed moderate left-sided facet degeneration. Moderate left
with mild right C4 foraminal narrowing.

C4-C5: Mild disc bulge with uncovertebral spurring. Mild left
greater than right facet hypertrophy. No spinal stenosis. Mild
bilateral C5 foraminal narrowing.

C5-C6: Broad-based disc osteophyte complex flattens and partially
effaces the ventral thecal sac, asymmetric to the right. Mild spinal
stenosis with mild flattening of the ventral cord, but no cord
signal changes. Severe right worse than left C6 foraminal narrowing.

C6-C7: Minimal disc bulge with bilateral uncovertebral spurring. No
spinal stenosis. Superimposed mild left-sided facet hypertrophy.
Mild bilateral C7 foraminal narrowing.

C7-T1: Negative interspace. Mild left-sided facet hypertrophy. No
stenosis.

Visualized upper thoracic spine demonstrates no significant finding.
IMPRESSION: 1. Degenerative disc osteophyte at C5-6 with resultant mild spinal
stenosis, with severe bilateral C6 foraminal narrowing.
2. Left eccentric disc osteophyte and facet arthrosis at C3-4 with
resultant mild left-sided spinal stenosis, with moderate left C4
foraminal narrowing.
3. More mild multilevel spondylosis elsewhere within the cervical
spine as above. No other significant stenosis or neural impingement.
4. Post treatment changes within the visualized neck with associated
right paraesophageal nodal conglomerate, partially visualized, but
grossly similar to previous neck CT from [DATE].

## 2021-08-10 IMAGING — DX DG HAND 2V*R*
2 series · 2 of 2 positions shown · non-contrast
Comparison: None.

CLINICAL DATA: Hand and wrist soreness and swelling

EXAM:
RIGHT WRIST - 2 VIEW; RIGHT HAND - 2 VIEW

[hand ap]
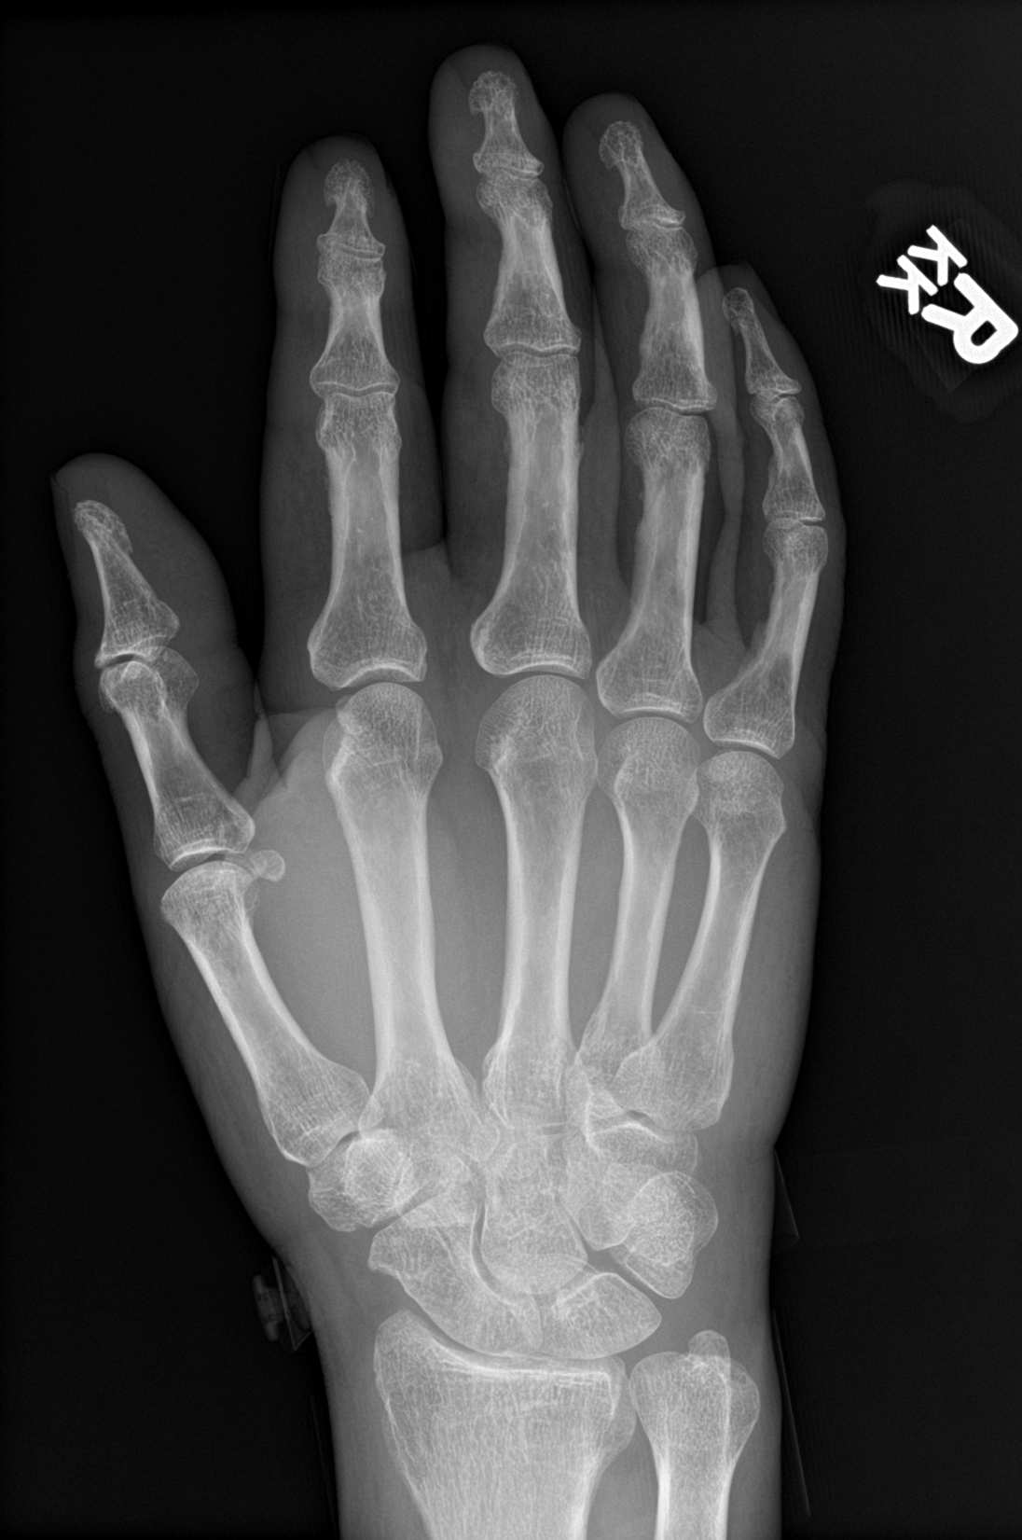

[hand lat]
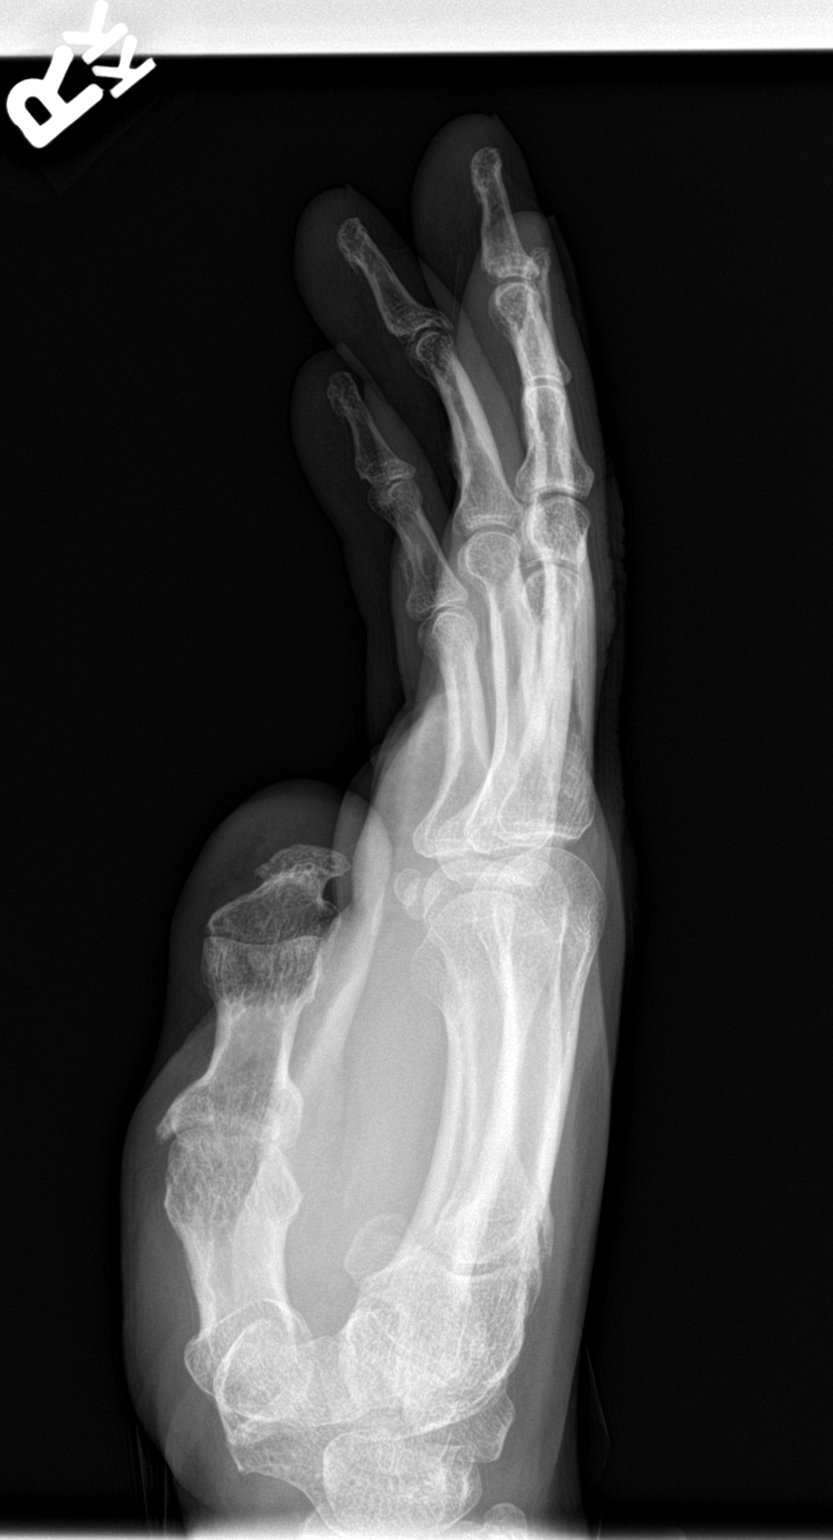

[2 of 2 positions shown; findings below may reference images not displayed]

FINDINGS: Wrist: There is no acute fracture or dislocation. There is minimal
positive ulnar variance. Alignment is otherwise normal. The joint
spaces are preserved. The soft tissues are unremarkable.

Hand: There is no acute fracture or dislocation. Alignment is
normal. There is degenerative change at the thumb MCP joint. The
joint spaces are otherwise preserved. The soft tissues are
unremarkable.
IMPRESSION: Mild degenerative change at the thumb MCP joint. Otherwise,
unremarkable hand and wrist radiographs.

## 2021-08-10 MED ORDER — LACTULOSE 10 GM/15ML PO SOLN
20.0000 g | Freq: Once | ORAL | Status: AC
  Administered 2021-08-10: 07:00:00 20 g
  Filled 2021-08-10: qty 30

## 2021-08-10 MED ORDER — PANTOPRAZOLE SODIUM 40 MG PO PACK
40.0000 mg | PACK | Freq: Every day | ORAL | Status: DC
  Administered 2021-08-10 – 2021-08-13 (×4): 40 mg
  Filled 2021-08-10 (×5): qty 20

## 2021-08-10 MED ORDER — LEVOTHYROXINE SODIUM 50 MCG PO TABS
50.0000 ug | ORAL_TABLET | Freq: Every day | ORAL | Status: DC
  Administered 2021-08-11 – 2021-08-13 (×3): 50 ug
  Filled 2021-08-10 (×3): qty 1

## 2021-08-10 NOTE — NC FL2 (Signed)
[place] LEVEL OF CARE SCREENING TOOL     IDENTIFICATION  Patient Name: Ivan Garcia. Birthdate: 12/14/1955 Sex: male Admission Date (Current Location): 08/08/2021  [facility] [place] and [place] Number:  Engineering geologist and Address:  [facility], [address] [place], [place] [address]      Provider Number: [id number]  Attending Physician Name and Address:  Shelly Coss, MD  Relative Name and Phone Number:       Current Level of Care: Hospital Recommended Level of Care: [place] Prior Approval Number:    Date Approved/Denied:   PASRR Number: [id number]  Discharge Plan: SNF    Current Diagnoses: Patient Active Problem List   Diagnosis Date Noted   Aspiration pneumonia ([place]) 08/08/2021   Acute hypoxemic respiratory failure ([place]) 08/08/2021   Sepsis ([place]) 08/08/2021   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   PEA (Pulseless electrical activity) ([place]) 05/27/2021   Examination of participant in clinical trial 05/03/2021   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   Bone pain 07/14/2020   Encounter for antineoplastic immunotherapy 04/27/2020   Stage 3a chronic kidney disease ([place]) 12/22/2019   Encounter for antineoplastic chemotherapy 10/06/2019   Neoplasm related pain 10/06/2019   Stage 3 chronic kidney disease ([place]) 03/24/2018   Edema 03/24/2018   Port-A-Cath in place 03/24/2018   ARF (acute renal failure) ([place]) 01/02/2018   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   Goals of care, counseling/discussion 12/10/2017   Encounter for screening colonoscopy 02/28/2017    Orientation RESPIRATION BLADDER Height & Weight     Self, Time, Situation, Place  Normal Continent Weight: 85.8 kg Height:  '5\' 10"'$  (177.8 cm)  BEHAVIORAL SYMPTOMS/MOOD NEUROLOGICAL BOWEL NUTRITION STATUS      Continent Feeding tube [employer] 1.4-  bolus feeds via peg 5 times per day)  AMBULATORY STATUS COMMUNICATION OF NEEDS Skin   Extensive Assist Verbally Normal                       Personal Care Assistance Level of Assistance  Bathing, Feeding, Dressing Bathing Assistance: Maximum assistance Feeding assistance: Maximum assistance Dressing Assistance: Maximum assistance     Functional Limitations Info  Sight, Hearing, Speech Sight Info: Adequate Hearing Info: Adequate Speech Info: Adequate    SPECIAL CARE FACTORS FREQUENCY  PT (By licensed PT), OT (By licensed OT)     PT Frequency: 5 times per week OT Frequency: 5 times per week            Contractures Contractures Info: Not present    Additional Factors Info  Code Status, Allergies Code Status Info: Full Allergies Info: Cetuximab           Current Medications (08/10/2021):  This is the current hospital active medication list Current Facility-Administered Medications  Medication Dose Route Frequency Provider Last Rate Last Admin   ALPRAZolam Duanne Moron) tablet 0.25 mg  0.25 mg Oral QHS PRN Sharion Settler, NP   0.25 mg at 08/10/21 1058   cefTRIAXone (ROCEPHIN) 2 g in sodium chloride 0.9 % 100 mL IVPB  2 g Intravenous Q24H Sharen Hones, MD   Stopped at 08/09/21 1747   Chlorhexidine Gluconate Cloth 2 % PADS 6 each  6 each Topical Daily Sharen Hones, MD   6 each at 08/10/21 1100   enoxaparin (LOVENOX) injection 40 mg  40 mg Subcutaneous Q24H Sharen Hones, MD   40 mg at  08/09/21 1713   feeding supplement (KATE FARMS STANDARD 1.4) liquid 325 mL  325 mL Per Tube 5 X Daily Adhikari, Amrit, MD   325 mL at 08/10/21 0928   free water 120 mL  120 mL Per Tube 5 X Daily Adhikari, Amrit, MD   120 mL at 08/10/21 0941   ibuprofen (ADVIL) tablet 600 mg  600 mg Oral Q6H PRN Shelly Coss, MD       insulin aspart (novoLOG) injection 0-9 Units  0-9 Units Subcutaneous Q4H Sharen Hones, MD   1 Units at 08/09/21 1946   lactated ringers bolus 1,000 mL  1,000 mL Intravenous Once  Nena Polio, MD       levothyroxine (SYNTHROID) tablet 50 mcg  50 mcg Per Tube [id number] Shelly Coss, MD       metroNIDAZOLE (FLAGYL) IVPB 500 mg  500 mg Intravenous Redmond Pulling, MD 100 mL/hr at 08/10/21 0948 500 mg at 08/10/21 0948   morphine 2 MG/ML injection 2 mg  2 mg Intravenous Q4H PRN Shelly Coss, MD       pantoprazole sodium (PROTONIX) 40 mg oral suspension 40 mg  40 mg Per Tube Daily Adhikari, Amrit, MD   40 mg at 08/10/21 1058   polyethylene glycol (MIRALAX / GLYCOLAX) packet 17 g  17 g Per Tube Daily Adhikari, Amrit, MD   17 g at 08/10/21 [id number]   traMADol (ULTRAM) tablet 50 mg  50 mg Oral Q6H PRN Shelly Coss, MD   50 mg at 08/10/21 1059   Facility-Administered Medications Ordered in Other Encounters  Medication Dose Route Frequency Provider Last Rate Last Admin   sodium chloride flush (NS) 0.9 % injection 10 mL  10 mL Intravenous PRN Earlie Server, MD       sodium chloride flush (NS) 0.9 % injection 10 mL  10 mL Intravenous PRN Earlie Server, MD   10 mL at 10/13/20 1034     Discharge Medications: Please see discharge summary for a list of discharge medications.  Relevant Imaging Results:  Relevant Lab Results:   Additional Information SS# [id number]  Shelbie Hutching, RN

## 2021-08-10 NOTE — Progress Notes (Signed)
PROGRESS NOTE    Ivan Garcia.  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  On presentation he was found to be hypoxemic.  Patient has severe dysphagia and has been strictly n.p.o. and gets nutrition from the tube feed.  On presentation, he was tachycardic, blood pressure was stable, he required 5 L of oxygen for maintenance of saturation.  Lab work showed hyponatremia, elevated glucose, creatinine of 1.44.  COVID screen test negative.  Chest CT scan showed multilobar aspiration pneumonia.  Started on  antibiotics.  Orthopedics consulted for severe carpal tunnel syndrome of the right hand  Assessment & Plan:   Active Problems:   Aspiration pneumonia (HCC)   Acute hypoxemic respiratory failure (HCC)   Sepsis (HCC)   Acute hypoxic respiratory failure secondary to multilobar aspiration pneumonia/severe sepsis: Met septic criteria with tachycardia, leukocytosis, lactic acidosis, AKI.  Required 5 L of oxygen for maintenance of saturation on admission. Started on broad-spectrum antibiotics with ceftriaxone and Flagyl which we will continue.  Respiratory status is improving.  We will continue to try to wean the oxygen.  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  Strictly n.p.o. at home. Follows with oncology as an outpatient.On lenvatinib and also on immunotherapy  AKI/hyponatremia: Secondary to dehydration, rhabdomyolysis.  Resolved with IV fluids  Fall: Has problem with ambulation.  Had bruise on the forehead after the fall.  CT scan did not show any intracranial hemorrhage. PT/OT recommended  SNF  History of hypertension: On amlodipine at home.  Currently blood pressure stable  Hyperlipidemia: On Lipitor at home.  Hypothyroidism: On Synthyroid at home.  Uncontrolled diabetes type 2: Hyperglycemic in presentation.  Started on sliding scale insulin.  Monitor blood sugars  Right hand carpal tunnel syndrome/chronic pain syndrome: Follows with orthopedics.  Recently had a steroid shot for carpal Syndrome.  Impaired mobility on the right hand with pain and swelling.  Continue pain medicines/supportive care .  We have consulted [facility].  Will obtain x-ray of the wrist/hand  Nutrition Problem: Increased nutrient needs Etiology: cancer and cancer related treatments      DVT prophylaxis:Lovenox Code Status: Full Family Communication: Discussed with the sister face-to-face on 08/10/2021 Status is: Inpatient  Remains inpatient appropriate because:Inpatient level of care appropriate due to severity of illness  Dispo: The patient is from: Home              Anticipated d/c is to: skilled nursing facility              Patient currently is not medically stable to d/c.   Difficult to place patient No     Consultants: None  Procedures:None  Antimicrobials:  Anti-infectives (From admission, onward)    Start     Dose/Rate Route Frequency Ordered Stop   08/08/21 1615  cefTRIAXone (ROCEPHIN) 2 g in sodium chloride 0.9 % 100 mL IVPB        2 g 200 mL/hr over 30 Minutes Intravenous Every 24 hours 08/08/21 1610 08/13/21 1614   08/08/21 1615  metroNIDAZOLE (FLAGYL) IVPB 500 mg        500 mg 100 mL/hr over 60 Minutes Intravenous Every 8 hours  08/08/21 1610     08/08/21 1045  ceFEPIme (MAXIPIME) 2 g in sodium chloride 0.9 % 100 mL IVPB        2 g 200 mL/hr over 30 Minutes Intravenous  Once 08/08/21 1039 08/08/21 1122   08/08/21 1045  metroNIDAZOLE (FLAGYL) IVPB 500 mg        500 mg 100 mL/hr over 60 Minutes Intravenous  Once 08/08/21 1039 08/08/21 1218   08/08/21 1045  vancomycin  (VANCOCIN) IVPB 1000 mg/200 mL premix        1,000 mg 200 mL/hr over 60 Minutes Intravenous  Once 08/08/21 1039 08/08/21 1333       Subjective:  Patient seen and examined the bedside this morning.  Hemodynamically stable.  Swelling of the right hand and wrist have slightly improved today.  Respiratory status stable but is still on 3 L of oxygen which we are trying to wean.  Denies any worsening cough or shortness of breath.  Objective: Vitals:   08/09/21 1748 08/09/21 1944 08/09/21 2314 08/10/21 0531  BP: 121/81 98/75 116/73 (!) 114/102  Pulse: (!) 105 100 (!) 103 97  Resp: _0 Temp: 98.1 F (36.7 C) 97.7 F (36.5 C) 97.6 F (36.4 C) 97.9 F (36.6 C)  TempSrc: Oral Oral  Oral  SpO2: 95% 96% 95% 97%  Weight:      Height:        Intake/Output Summary (Last 24 hours) at 08/10/2021 0813 Last data filed at 08/10/2021 0604 Gross per 24 hour  Intake 1270.9 ml  Output 1600 ml  Net -329.1 ml   Filed Weights   08/08/21 1728  Weight: 85.8 kg    Examination:  General exam: Overall comfortable, not in distress HEENT: PERRL Respiratory system:  no wheezes or crackles  Cardiovascular system: S1 & S2 heard, RRR.  Gastrointestinal system: Abdomen is nondistended, soft and nontender.PEG Central nervous system: Alert and oriented Extremities: No edema, no clubbing ,no cyanosis, mild swelling of the right hand and wrist Skin: No rashes, no ulcers,no icterus      Data Reviewed: I have personally reviewed following labs and imaging studies  CBC: Recent Labs  Lab 08/08/21 0919 08/09/21 0610 08/10/21 0533  WBC 19.8* 9.0 9.6  NEUTROABS 17.7* 8.0* 8.7*  HGB 14.6 13.8 12.9*  HCT 42.6 39.8 37.3*  MCV 91.6 91.5 91.4  PLT 390 321 970   Basic Metabolic Panel: Recent Labs  Lab 08/08/21 0919 08/08/21 1335 08/09/21 0610 08/10/21 0533  NA 132* 126* 133* 133*  K 4.8 4.4 4.0 4.1  CL 96* 95* 99 99  CO2 _1 GLUCOSE 141* 272* 136* 98  BUN 35* 28* 25* 21   CREATININE 1.87* 1.44* 0.96 0.70  CALCIUM 9.1 8.0* 8.7* 8.1*  MG  --   --  2.5*  --   PHOS  --   --  3.8  --    GFR: Estimated Creatinine Clearance: 93.8 mL/min (by C-G formula based on SCr of 0.7 mg/dL). Liver Function Tests: Recent Labs  Lab 08/08/21 0919  AST 48*  ALT 24  ALKPHOS 76  BILITOT 0.9  PROT 7.3  ALBUMIN 4.0   Recent Labs  Lab 08/08/21 0919  LIPASE 101*   No results for input(s): AMMONIA in the last 168 hours. Coagulation Profile: No results for input(s): INR, PROTIME in the last 168 hours. Cardiac Enzymes: Recent Labs  Lab 08/08/21 0919  CKTOTAL 1,739*   BNP (last 3 results) No results for input(s):  PROBNP in the last 8760 hours. HbA1C: Recent Labs    08/09/21 0610  HGBA1C 6.0*   CBG: Recent Labs  Lab 08/09/21 1630 08/09/21 1933 08/09/21 2319 08/10/21 0027 08/10/21 0542  GLUCAP 126* 137* 102* 116* 77   Lipid Profile: No results for input(s): CHOL, HDL, LDLCALC, TRIG, CHOLHDL, LDLDIRECT in the last 72 hours. Thyroid Function Tests: No results for input(s): TSH, T4TOTAL, FREET4, T3FREE, THYROIDAB in the last 72 hours. Anemia Panel: No results for input(s): VITAMINB12, FOLATE, FERRITIN, TIBC, IRON, RETICCTPCT in the last 72 hours. Sepsis Labs: Recent Labs  Lab 08/08/21 0919 08/08/21 1109  LATICACIDVEN 2.5* 1.7    Recent Results (from the past 240 hour(s))  Culture, blood (single)     Status: None (Preliminary result)   Collection Time: 08/08/21 11:09 AM   Specimen: BLOOD  Result Value Ref Range Status   Specimen Description BLOOD RIGHT ANTECUBITAL  Final   Special Requests   Final    BOTTLES DRAWN AEROBIC AND ANAEROBIC Blood Culture adequate volume   Culture   Final    NO GROWTH 2 DAYS Performed at [facility], [address] [place], [place] [address]    Report Status PENDING  Incomplete  Resp Panel by RT-PCR (Flu A&B, Covid)     Status: None   Collection Time: 08/08/21 11:13 AM   Specimen: Nasopharyngeal(NP)  swabs in vial transport medium  Result Value Ref Range Status   SARS Coronavirus 2 by RT PCR NEGATIVE NEGATIVE Final    Comment: (NOTE) SARS-CoV-2 target nucleic acids are NOT DETECTED.  The SARS-CoV-2 RNA is generally detectable in upper respiratory specimens during the acute phase of infection. The lowest concentration of SARS-CoV-2 viral copies this assay can detect is 138 copies/mL. A negative result does not preclude SARS-Cov-2 infection and should not be used as the sole basis for treatment or other patient management decisions. A negative result may occur with  improper specimen collection/handling, submission of specimen other than nasopharyngeal swab, presence of viral mutation(s) within the areas targeted by this assay, and inadequate number of viral copies(<138 copies/mL). A negative result must be combined with clinical observations, patient history, and epidemiological information. The expected result is Negative.  Fact Sheet for Patients:  EntrepreneurPulse.com.au  Fact Sheet for Healthcare Providers:  IncredibleEmployment.be  This test is no t yet approved or cleared by the [place] FDA and  has been authorized for detection and/or diagnosis of SARS-CoV-2 by FDA under an Emergency Use Authorization (EUA). This EUA will remain  in effect (meaning this test can be used) for the duration of the COVID-19 declaration under Section 564(b)(1) of the Act, 21 U.S.C.section 360bbb-3(b)(1), unless the authorization is terminated  or revoked sooner.       Influenza A by PCR NEGATIVE NEGATIVE Final   Influenza B by PCR NEGATIVE NEGATIVE Final    Comment: (NOTE) The Xpert Xpress SARS-CoV-2/FLU/RSV plus assay is intended as an aid in the diagnosis of influenza from Nasopharyngeal swab specimens and should not be used as a sole basis for treatment. Nasal washings and aspirates are unacceptable for Xpert Xpress  SARS-CoV-2/FLU/RSV testing.  Fact Sheet for Patients: EntrepreneurPulse.com.au  Fact Sheet for Healthcare Providers: IncredibleEmployment.be  This test is not yet approved or cleared by the [place] FDA and has been authorized for detection and/or diagnosis of SARS-CoV-2 by FDA under an Emergency Use Authorization (EUA). This EUA will remain in effect (meaning this test can be used) for the duration of the COVID-19 declaration under Section  564(b)(1) of the Act, 21 U.S.C. section 360bbb-3(b)(1), unless the authorization is terminated or revoked.  Performed at [facility], [address] [place], [place] [address]   Culture, blood (routine x 2)     Status: None (Preliminary result)   Collection Time: 08/08/21 11:31 AM   Specimen: BLOOD  Result Value Ref Range Status   Specimen Description BLOOD  LAV  Final   Special Requests   Final    BOTTLES DRAWN AEROBIC AND ANAEROBIC Blood Culture adequate volume   Culture   Final    NO GROWTH 2 DAYS Performed at [facility], [address] [place], [place] [address]    Report Status PENDING  Incomplete         Radiology Studies: CT HEAD WO CONTRAST (5MM)  Result Date: 08/08/2021 CLINICAL DATA:  Found on floor by family. Personal history of esophageal cancer. Feeding tube. Mental status change. Hypoxia. EXAM: CT HEAD WITHOUT CONTRAST CT CERVICAL SPINE WITHOUT CONTRAST TECHNIQUE: Multidetector CT imaging of the head and cervical spine was performed following the standard protocol without intravenous contrast. Multiplanar CT image reconstructions of the cervical spine were also generated. COMPARISON:  MRI had without and with contrast 04/10/2021 FINDINGS: CT HEAD FINDINGS Brain: No acute infarct, hemorrhage, or mass lesion is present. No significant white matter lesions are present. The ventricles are of normal size. No significant extraaxial fluid collection is present. The  brainstem and cerebellum are within normal limits. Vascular: No hyperdense vessel or unexpected calcification. Skull: Right supraorbital soft tissue swelling and hematoma is present. No underlying fracture is present. Calvarium otherwise within normal limits. No other significant extracranial soft tissue injury is present. Sinuses/Orbits: Mild mucosal thickening is present in the maxillary sinuses bilaterally. No air-fluid levels are present. The globes and orbits are within normal limits. CT CERVICAL SPINE FINDINGS Alignment: No significant listhesis is present. Cervical lordosis preserved. Skull base and vertebrae: Craniocervical junction is within normal limits. Vertebral body heights are normal. Narrow acute or healing fractures are present. Soft tissues and spinal canal: No prevertebral fluid or swelling. No visible canal hematoma. Esophageal mass the thoracic inlet again noted. Edema along the right vocal cord improved. Right IJ Port-A-Cath in place. Disc levels: Uncovertebral spurring leads to moderate foraminal narrowing at C3-4 and C5-6, left greater than right. Upper chest: Lung apices are clear. IMPRESSION: 1. Right supraorbital soft tissue swelling and hematoma without underlying fracture. 2. Normal CT appearance of the brain. 3. No acute fracture or traumatic subluxation in the cervical spine. 4. Multilevel degenerative changes of the cervical spine as described. Electronically Signed   By: San Morelle M.D.   On: 08/08/2021 13:54   CT Angio Chest PE W and/or Wo Contrast  Result Date: 08/08/2021 CLINICAL DATA:  History metastatic or pharyngeal squamous carcinoma. Found on floor at home and hypoxic. Elevated white blood cell count, D-dimer and lipase. EXAM: CT ANGIOGRAPHY CHEST WITH CONTRAST TECHNIQUE: Multidetector CT imaging of the chest was performed using the standard protocol during bolus administration of intravenous contrast. Multiplanar CT image reconstructions and MIPs were obtained  to evaluate the vascular anatomy. CONTRAST:  5m OMNIPAQUE IOHEXOL 350 MG/ML SOLN COMPARISON:  Prior CT of the chest on 04/01/2021 FINDINGS: Cardiovascular: The pulmonary arteries are adequately opacified. There is no evidence of pulmonary embolism. Central pulmonary arteries are normal in caliber. The thoracic aorta is normal in caliber. Stable heart size. Stable calcified coronary artery plaque. 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 No evidence of mediastinal or hilar lymphadenopathy. Increased prominence of some adjacent superior left axillary lymph nodes with the largest measuring approximately 1 cm in short axis. Lungs/Pleura: New extensive airspace consolidation and volume loss of the right lower lobe and right middle lobe with visible occlusive debris in the bronchus intermedius and scattered small air bronchograms in consolidated lung. Visible nearly occlusive debris in the left mainstem bronchus and debris in the left lower lobe bronchus with near complete atelectasis of the left lower lobe. Focal airspace disease in the medial aspect of the right middle lobe and posterior left upper lobe. Pulmonary findings are consistent with bilateral pneumonia and highly suggestive of aspiration pneumonia given the extensive debris in the airways. No significant associated pleural fluid or evidence of pneumothorax. Known metastatic pulmonary nodule in the superior right upper lobe shows interval enlargement since the prior study in April. This previously measured 8 mm and now measures 13 x 13 mm. Metastatic mass in the inferior aspect of the right upper lobe previously measuring 1.2 x 1.3 cm shows enlargement and now measures approximately 2.8 x 2.9 cm. Previously noted right lower lobe pulmonary nodule is not visible on today study due to the extensive consolidation and volume loss of the  right lower lobe. Musculoskeletal: No chest wall abnormality. No acute or significant osseous findings. Review of the MIP images confirms the above findings. IMPRESSION: 1. No evidence of pulmonary embolism. 2. New extensive airspace consolidation and volume loss of the right lower lobe, right middle lobe and left lower lobe with significant debris in the right bronchus intermedius, left mainstem bronchus and left lower lobe bronchus. Additional airspace disease in the right middle lobe and left upper lobe. Findings are suggestive of bilateral aspiration pneumonia. 3. Progression of metastatic disease in the right lung with enlargement of superior right upper lobe and inferior right upper lobe nodules as noted above. The previously noted right lower lobe nodule cannot be assessed as it is obscured by consolidation of the right lower lobe. 4. 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 5. Increased prominence of left axillary lymph nodes which only measure 1 cm in short axis. Electronically Signed   By: Aletta Edouard M.D.   On: 08/08/2021 14:11   CT Cervical Spine Wo Contrast  Result Date: 08/08/2021 CLINICAL DATA:  Found on floor by family. Personal history of esophageal cancer. Feeding tube. Mental status change. Hypoxia. EXAM: CT HEAD WITHOUT CONTRAST CT CERVICAL SPINE WITHOUT CONTRAST TECHNIQUE: Multidetector CT imaging of the head and cervical spine was performed following the standard protocol without intravenous contrast. Multiplanar CT image reconstructions of the cervical spine were also generated. COMPARISON:  MRI had without and with contrast 04/10/2021 FINDINGS: CT HEAD FINDINGS Brain: No acute infarct, hemorrhage, or mass lesion is present. No significant white matter lesions are present. The ventricles are of normal size. No significant extraaxial fluid collection is present. The brainstem and cerebellum are within normal limits. Vascular: No hyperdense vessel or  unexpected calcification. Skull: Right supraorbital soft tissue swelling and hematoma is present. No underlying fracture is present. Calvarium otherwise within normal limits. No other significant extracranial soft tissue injury is present. Sinuses/Orbits: Mild mucosal thickening is present in the maxillary sinuses bilaterally. No air-fluid levels are present. The globes and orbits are within normal limits. CT CERVICAL SPINE FINDINGS Alignment: No significant listhesis is present. Cervical lordosis preserved. Skull base and vertebrae:  Craniocervical junction is within normal limits. Vertebral body heights are normal. Narrow acute or healing fractures are present. Soft tissues and spinal canal: No prevertebral fluid or swelling. No visible canal hematoma. Esophageal mass the thoracic inlet again noted. Edema along the right vocal cord improved. Right IJ Port-A-Cath in place. Disc levels: Uncovertebral spurring leads to moderate foraminal narrowing at C3-4 and C5-6, left greater than right. Upper chest: Lung apices are clear. IMPRESSION: 1. Right supraorbital soft tissue swelling and hematoma without underlying fracture. 2. Normal CT appearance of the brain. 3. No acute fracture or traumatic subluxation in the cervical spine. 4. Multilevel degenerative changes of the cervical spine as described. Electronically Signed   By: San Morelle M.D.   On: 08/08/2021 13:54   CT ABDOMEN PELVIS W CONTRAST  Result Date: 08/08/2021 CLINICAL DATA:  History of metastatic oropharyngeal squamous carcinoma. Found on floor at home and hypoxic. Elevated white blood cell count, D-dimer and lipase. EXAM: CT ABDOMEN AND PELVIS WITH CONTRAST TECHNIQUE: Multidetector CT imaging of the abdomen and pelvis was performed using the standard protocol following bolus administration of intravenous contrast. CONTRAST:  14m OMNIPAQUE IOHEXOL 350 MG/ML SOLN COMPARISON:  CT of the abdomen without contrast on 09/15/2020 and PET scan on  11/09/2020 FINDINGS: Hepatobiliary: Stable left lobe hepatic cyst. Gallbladder and bile ducts are unremarkable. Pancreas: Unremarkable. No pancreatic ductal dilatation or surrounding inflammatory changes. Spleen: Normal in size without focal abnormality. Adrenals/Urinary Tract: Stable right renal cyst. No hydronephrosis or calculi. No solid renal masses or adrenal masses. The bladder is unremarkable. Stomach/Bowel: Gastrostomy tube present with normal positioning within the lumen of the stomach. No evidence of bowel obstruction, ileus or inflammation. No mass lesions are seen involving the bowel. Diverticulosis of the descending and sigmoid colon without evidence of diverticulitis. No free intraperitoneal air. Vascular/Lymphatic: No significant vascular findings are present. No enlarged abdominal or pelvic lymph nodes. Reproductive: Prostate is unremarkable. Other: No abdominal wall hernia or abnormality. No abdominopelvic ascites. Musculoskeletal: No acute or significant osseous findings. IMPRESSION: No acute findings in the abdomen or pelvis. Gastrostomy tube is present and in appropriate position. Electronically Signed   By: GAletta EdouardM.D.   On: 08/08/2021 14:22   DG Chest Portable 1 View  Result Date: 08/08/2021 CLINICAL DATA:  Low O2 saturation EXAM: PORTABLE CHEST 1 VIEW COMPARISON:  None FINDINGS: There is a chest port with catheter tip overlying the right atrium. Unchanged, enlarged cardiac silhouette. There are low lung volumes with bibasilar atelectasis and possible small effusions. Nodular opacities in the right lung consistent with known pulmonary nodules. No new focal airspace disease. No visible pneumothorax. Bilateral shoulder degenerative changes. No acute osseous abnormality. Thoracic spondylosis. IMPRESSION: Low lung volumes with bibasilar subsegmental atelectasis and probable small bilateral pleural effusions. Electronically Signed   By: JMaurine SimmeringM.D.   On: 08/08/2021 09:54         Scheduled Meds:  Chlorhexidine Gluconate Cloth  6 each Topical Daily   enoxaparin (LOVENOX) injection  40 mg Subcutaneous Q24H   feeding supplement (K[employer] STANDARD 1.4)  325 mL Per Tube 5 X Daily   free water  120 mL Per Tube 5 X Daily   insulin aspart  0-9 Units Subcutaneous Q4H   pantoprazole  40 mg Oral Daily   polyethylene glycol  17 g Per Tube Daily   Continuous Infusions:  cefTRIAXone (ROCEPHIN)  IV Stopped (08/09/21 1747)   lactated ringers     metronidazole Stopped (08/10/21 0139)     LOS:  2 days    Time spent:25 mins. More than 50% of that time was spent in counseling and/or coordination of care.      Shelly Coss, MD [facility] P9/06/2021, 8:13 AM

## 2021-08-10 NOTE — TOC Initial Note (Signed)
Transition of Care (TOC) - Initial/Assessment Note    Patient Details  Name: Ivan Garcia. MRN: [id number] Date of Birth: 19-Nov-1956  Transition of Care [facility]) CM/SW Contact:    Shelbie Hutching, RN Phone Number: 08/10/2021, 2:40 PM  Clinical Narrative:                 Patient admitted to the hospital with pneumonia.  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  Patient has been independent at home and able to do his own feeds.  Currently PT is recommending SNF and patient is in agreement .  Patient would prefer [facility].  SNF workup started.    Expected Discharge Plan: Skilled Nursing Facility Barriers to Discharge: Continued Medical Work up   Patient Goals and CMS Choice Patient states their goals for this hospitalization and ongoing recovery are:: Patient wants to be able to use his right arm again and take care of him CMS Medicare.gov Compare Post Acute Care list provided to:: Patient Choice offered to / list presented to : Patient  Expected Discharge Plan and Services Expected Discharge Plan: [place]   Discharge Planning Services: CM Consult Post Acute Care Choice: [place] Living arrangements for the past 2 months: Single Family Home                 DME Arranged: N/A DME Agency: NA       HH Arranged: NA HH Agency: NA        Prior Living Arrangements/Services Living arrangements for the past 2 months: Single Family Home Lives with:: Self Patient language and need for interpreter reviewed:: Yes Do you feel safe going back to the place where you live?: Yes      Need for Family Participation in Patient Care: Yes (Comment) Care giver support system in place?: Yes (comment) (sister visiting and friend) Current home services: DME (walker) Criminal Activity/Legal Involvement Pertinent to Current Situation/Hospitalization: No - Comment as needed  Activities of Daily  Living Home Assistive Devices/Equipment: None ADL Screening (condition at time of admission) Patient's cognitive ability adequate to safely complete daily activities?: Yes Is the patient deaf or have difficulty hearing?: Yes (right ear) Does the patient have difficulty seeing, even when wearing glasses/contacts?: No Does the patient have difficulty concentrating, remembering, or making decisions?: Yes Patient able to express need for assistance with ADLs?: Yes Does the patient have difficulty dressing or bathing?: Yes Independently performs ADLs?: Yes (appropriate for developmental age) Does the patient have difficulty walking or climbing stairs?: No Weakness of Legs: Both Weakness of Arms/Hands: Right  Permission Sought/Granted Permission sought to share information with : Case Manager Permission granted to share information with : Yes, Verbal Permission Granted     Permission granted to share info w AGENCY: SNF's        Emotional Assessment Appearance:: Appears stated age Attitude/Demeanor/Rapport: Engaged Affect (typically observed): Accepting Orientation: : Oriented to Self, Oriented to Place, Oriented to  Time, Oriented to Situation Alcohol / Substance Use: Not Applicable Psych Involvement: No (comment)  Admission diagnosis:  Aspiration pneumonia ([place]) [J69.0] Hypoxia [R09.02] AKI (acute kidney injury) ([place]) [N17.9] Non-traumatic rhabdomyolysis [M62.82] Altered mental status, unspecified altered mental status type [R41.82] Aspiration pneumonia of right lung due to milk, unspecified part of lung ([place]) [J69.0] Hypotension due to hypovolemia [I95.89, E86.1] Patient Active Problem List   Diagnosis Date Noted   Aspiration pneumonia ([place]) 08/08/2021  Acute hypoxemic respiratory failure ([facility]) 08/08/2021   Sepsis ([place]) 08/08/2021   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   PEA (Pulseless electrical activity)  ([place]) 05/27/2021   Examination of participant in clinical trial 05/03/2021   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   Bone pain 07/14/2020   Encounter for antineoplastic immunotherapy 04/27/2020   Stage 3a chronic kidney disease ([place]) 12/22/2019   Encounter for antineoplastic chemotherapy 10/06/2019   Neoplasm related pain 10/06/2019   Stage 3 chronic kidney disease ([place]) 03/24/2018   Edema 03/24/2018   Port-A-Cath in place 03/24/2018   ARF (acute renal failure) ([place]) 01/02/2018   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

## 2021-08-10 NOTE — Plan of Care (Signed)
Pt primary complaint overnight was pain in right arm - pain meds adjusted with some relief and constipation - states he has not had a bowel movement in 4 days (since taking morphine), one time dose of lactulose given.   Problem: Clinical Measurements: Goal: Ability to maintain clinical measurements within normal limits will improve Outcome: Progressing Goal: Will remain free from infection Outcome: Progressing Goal: Diagnostic test results will improve Outcome: Progressing Goal: Respiratory complications will improve Outcome: Progressing Goal: Cardiovascular complication will be avoided Outcome: Progressing   Problem: Elimination: Goal: Will not experience complications related to bowel motility Outcome: Progressing Goal: Will not experience complications related to urinary retention Outcome: Progressing   Problem: Pain Managment: Goal: General experience of comfort will improve Outcome: Progressing

## 2021-08-10 NOTE — Consult Note (Signed)
Reason for Consult: Right hand numbness and pain Referring Physician: Dr. Sharlet Salina Delana Meyer. is an 65 y.o. male.  HPI: Patient is a 65 year old who recently has had EMG nerve conduction test showing carpal tunnel and had a carpal tunnel injection about 2 weeks ago by Dr. Candelaria Stagers in our office.  He is now having limited use of the arm and is consulted for evaluation of this.  He and his wife thinks he is getting worse over the last week or 2 and had no improvement with injection.  Past Medical History:  Diagnosis Date   Arthritis    Cancer ([place])    Head and neck cancer   Hemorrhoids    Hyperlipidemia    Hypertension     Past Surgical History:  Procedure Laterality Date   COLONOSCOPY WITH PROPOFOL N/A 04/04/2017   Procedure: COLONOSCOPY WITH PROPOFOL;  Surgeon: Robert Bellow, MD;  Location: [facility];  Service: Endoscopy;  Laterality: N/A;   IR GASTROSTOMY TUBE MOD SED  09/24/2020   MANDIBLE SURGERY     PORT-A-CATH REMOVAL  07/17/2018   PORTA CATH INSERTION N/A 12/19/2017   Procedure: PORTA CATH INSERTION;  Surgeon: Algernon Huxley, MD;  Location: [place] [facility];  Service: Cardiovascular;  Laterality: N/A;   PORTA CATH INSERTION N/A 07/17/2018   Procedure: PORTA CATH INSERTION;  Surgeon: Algernon Huxley, MD;  Location: [place] [facility];  Service: Cardiovascular;  Laterality: N/A;   PORTA CATH INSERTION N/A 09/18/2019   Procedure: PORTA CATH INSERTION;  Surgeon: Algernon Huxley, MD;  Location: [place] [facility];  Service: Cardiovascular;  Laterality: N/A;   TONSILLECTOMY      Family History  Problem Relation Age of Onset   Breast cancer Mother    Asthma Mother    Congestive Heart Failure Mother    Prostate cancer Father    Brain cancer Father    Bladder Cancer Father     Social History:  reports that he has never smoked. He has never used smokeless tobacco. He reports that he does not drink alcohol and does not use drugs.  Allergies:  Allergies   Allergen Reactions   Cetuximab Anaphylaxis    Medications: I have reviewed the patient's current medications.  Results for orders placed or performed during the hospital encounter of 08/08/21 (from the past 48 hour(s))  Basic metabolic panel     Status: Abnormal   Collection Time: 08/08/21  1:35 PM  Result Value Ref Range   Sodium 126 (L) 135 - 145 mmol/L   Potassium 4.4 3.5 - 5.1 mmol/L   Chloride 95 (L) 98 - 111 mmol/L   CO2 24 22 - 32 mmol/L   Glucose, Bld 272 (H) 70 - 99 mg/dL    Comment: Glucose reference range applies only to samples taken after fasting for at least 8 hours.   BUN 28 (H) 8 - 23 mg/dL   Creatinine, Ser 1.44 (H) 0.61 - 1.24 mg/dL   Calcium 8.0 (L) 8.9 - 10.3 mg/dL   GFR, Estimated 54 (L) >60 mL/min    Comment: (NOTE) Calculated using the CKD-EPI Creatinine Equation (2021)    Anion gap 7 5 - 15    Comment: Performed at [facility], [address] [place], [place] [address]  Glucose, capillary     Status: Abnormal   Collection Time: 08/08/21  6:09 PM  Result Value Ref Range   Glucose-Capillary 128 (H) 70 - 99 mg/dL    Comment: Glucose  reference range applies only to samples taken after fasting for at least 8 hours.  Glucose, capillary     Status: Abnormal   Collection Time: 08/08/21  7:30 PM  Result Value Ref Range   Glucose-Capillary 108 (H) 70 - 99 mg/dL    Comment: Glucose reference range applies only to samples taken after fasting for at least 8 hours.  Glucose, capillary     Status: Abnormal   Collection Time: 08/09/21 12:30 AM  Result Value Ref Range   Glucose-Capillary 109 (H) 70 - 99 mg/dL    Comment: Glucose reference range applies only to samples taken after fasting for at least 8 hours.  Glucose, capillary     Status: Abnormal   Collection Time: 08/09/21  3:08 AM  Result Value Ref Range   Glucose-Capillary 140 (H) 70 - 99 mg/dL    Comment: Glucose reference range applies only to samples taken after fasting for at least 8 hours.   HIV Antibody (routine testing w rflx)     Status: None   Collection Time: 08/09/21  6:10 AM  Result Value Ref Range   HIV Screen 4th Generation wRfx Non Reactive Non Reactive    Comment: Performed at [facility], [place] [place], [place] [id number]  Basic metabolic panel     Status: Abnormal   Collection Time: 08/09/21  6:10 AM  Result Value Ref Range   Sodium 133 (L) 135 - 145 mmol/L   Potassium 4.0 3.5 - 5.1 mmol/L   Chloride 99 98 - 111 mmol/L   CO2 29 22 - 32 mmol/L   Glucose, Bld 136 (H) 70 - 99 mg/dL    Comment: Glucose reference range applies only to samples taken after fasting for at least 8 hours.   BUN 25 (H) 8 - 23 mg/dL   Creatinine, Ser 0.96 0.61 - 1.24 mg/dL   Calcium 8.7 (L) 8.9 - 10.3 mg/dL   GFR, Estimated >60 >60 mL/min    Comment: (NOTE) Calculated using the CKD-EPI Creatinine Equation (2021)    Anion gap 5 5 - 15    Comment: Performed at [facility], [address] [place], [place] [address]  CBC with Differential/Platelet     Status: Abnormal   Collection Time: 08/09/21  6:10 AM  Result Value Ref Range   WBC 9.0 4.0 - 10.5 K/uL   RBC 4.35 4.22 - 5.81 MIL/uL   Hemoglobin 13.8 13.0 - 17.0 g/dL   HCT 39.8 39.0 - 52.0 %   MCV 91.5 80.0 - 100.0 fL   MCH 31.7 26.0 - 34.0 pg   MCHC 34.7 30.0 - 36.0 g/dL   RDW 13.5 11.5 - 15.5 %   Platelets 321 150 - 400 K/uL   nRBC 0.0 0.0 - 0.2 %   Neutrophils Relative % 90 %   Neutro Abs 8.0 (H) 1.7 - 7.7 K/uL   Lymphocytes Relative 2 %   Lymphs Abs 0.2 (L) 0.7 - 4.0 K/uL   Monocytes Relative 5 %   Monocytes Absolute 0.4 0.1 - 1.0 K/uL   Eosinophils Relative 3 %   Eosinophils Absolute 0.3 0.0 - 0.5 K/uL   Basophils Relative 0 %   Basophils Absolute 0.0 0.0 - 0.1 K/uL   Immature Granulocytes 0 %   Abs Immature Granulocytes 0.03 0.00 - 0.07 K/uL    Comment: Performed at [facility], [address], [place],  [address]  Magnesium     Status: Abnormal   Collection Time:  08/09/21  6:10 AM  Result Value Ref Range   Magnesium 2.5 (H) 1.7 - 2.4 mg/dL    Comment: Performed at [facility], [address] [place], [place] [address]  Phosphorus     Status: None   Collection Time: 08/09/21  6:10 AM  Result Value Ref Range   Phosphorus 3.8 2.5 - 4.6 mg/dL    Comment: Performed at [facility], [address] [place], [place] [address]  Hemoglobin A1c     Status: Abnormal   Collection Time: 08/09/21  6:10 AM  Result Value Ref Range   Hgb A1c MFr Bld 6.0 (H) 4.8 - 5.6 %    Comment: (NOTE) Pre diabetes:          5.7%-6.4%  Diabetes:              >6.4%  Glycemic control for   <7.0% adults with diabetes    Mean Plasma Glucose 125.5 mg/dL    Comment: Performed at [address] [place], [place] [address]  Glucose, capillary     Status: Abnormal   Collection Time: 08/09/21  8:04 AM  Result Value Ref Range   Glucose-Capillary 123 (H) 70 - 99 mg/dL    Comment: Glucose reference range applies only to samples taken after fasting for at least 8 hours.  Glucose, capillary     Status: None   Collection Time: 08/09/21 12:32 PM  Result Value Ref Range   Glucose-Capillary 99 70 - 99 mg/dL    Comment: Glucose reference range applies only to samples taken after fasting for at least 8 hours.  Glucose, capillary     Status: Abnormal   Collection Time: 08/09/21  4:30 PM  Result Value Ref Range   Glucose-Capillary 126 (H) 70 - 99 mg/dL    Comment: Glucose reference range applies only to samples taken after fasting for at least 8 hours.  Glucose, capillary     Status: Abnormal   Collection Time: 08/09/21  7:33 PM  Result Value Ref Range   Glucose-Capillary 137 (H) 70 - 99 mg/dL    Comment: Glucose reference range applies only to samples taken after fasting for at least 8 hours.  Glucose, capillary     Status: Abnormal   Collection Time: 08/09/21 11:19 PM  Result Value Ref Range   Glucose-Capillary 102 (H) 70 - 99 mg/dL     Comment: Glucose reference range applies only to samples taken after fasting for at least 8 hours.  Glucose, capillary     Status: Abnormal   Collection Time: 08/10/21 12:27 AM  Result Value Ref Range   Glucose-Capillary 116 (H) 70 - 99 mg/dL    Comment: Glucose reference range applies only to samples taken after fasting for at least 8 hours.  CBC with Differential/Platelet     Status: Abnormal   Collection Time: 08/10/21  5:33 AM  Result Value Ref Range   WBC 9.6 4.0 - 10.5 K/uL   RBC 4.08 (L) 4.22 - 5.81 MIL/uL   Hemoglobin 12.9 (L) 13.0 - 17.0 g/dL   HCT 37.3 (L) 39.0 - 52.0 %   MCV 91.4 80.0 - 100.0 fL   MCH 31.6 26.0 - 34.0 pg   MCHC 34.6 30.0 - 36.0 g/dL   RDW 14.0 11.5 - 15.5 %   Platelets 306 150 - 400 K/uL   nRBC 0.0 0.0 - 0.2 %   Neutrophils Relative % 90 %   Neutro Abs 8.7 (H) 1.7 - 7.7  K/uL   Lymphocytes Relative 3 %   Lymphs Abs 0.3 (L) 0.7 - 4.0 K/uL   Monocytes Relative 5 %   Monocytes Absolute 0.5 0.1 - 1.0 K/uL   Eosinophils Relative 1 %   Eosinophils Absolute 0.1 0.0 - 0.5 K/uL   Basophils Relative 1 %   Basophils Absolute 0.1 0.0 - 0.1 K/uL   Immature Granulocytes 0 %   Abs Immature Granulocytes 0.03 0.00 - 0.07 K/uL    Comment: Performed at [facility], [address] [place], [place] [id number]  Basic metabolic panel     Status: Abnormal   Collection Time: 08/10/21  5:33 AM  Result Value Ref Range   Sodium 133 (L) 135 - 145 mmol/L   Potassium 4.1 3.5 - 5.1 mmol/L   Chloride 99 98 - 111 mmol/L   CO2 27 22 - 32 mmol/L   Glucose, Bld 98 70 - 99 mg/dL    Comment: Glucose reference range applies only to samples taken after fasting for at least 8 hours.   BUN 21 8 - 23 mg/dL   Creatinine, Ser 0.70 0.61 - 1.24 mg/dL   Calcium 8.1 (L) 8.9 - 10.3 mg/dL   GFR, Estimated >60 >60 mL/min    Comment: (NOTE) Calculated using the CKD-EPI Creatinine Equation (2021)    Anion gap 7 5 - 15    Comment: Performed at [facility], [address] [place], [place] [address]  Glucose, capillary     Status: None   Collection Time: 08/10/21  5:42 AM  Result Value Ref Range   Glucose-Capillary 77 70 - 99 mg/dL    Comment: Glucose reference range applies only to samples taken after fasting for at least 8 hours.  Glucose, capillary     Status: Abnormal   Collection Time: 08/10/21  8:39 AM  Result Value Ref Range   Glucose-Capillary 111 (H) 70 - 99 mg/dL    Comment: Glucose reference range applies only to samples taken after fasting for at least 8 hours.    CT HEAD WO CONTRAST (5MM)  Result Date: 08/08/2021 CLINICAL DATA:  Found on floor by family. Personal history of esophageal cancer. Feeding tube. Mental status change. Hypoxia. EXAM: CT HEAD WITHOUT CONTRAST CT CERVICAL SPINE WITHOUT CONTRAST TECHNIQUE: Multidetector CT imaging of the head and cervical spine was performed following the standard protocol without intravenous contrast. Multiplanar CT image reconstructions of the cervical spine were also generated. COMPARISON:  MRI had without and with contrast 04/10/2021 FINDINGS: CT HEAD FINDINGS Brain: No acute infarct, hemorrhage, or mass lesion is present. No significant white matter lesions are present. The ventricles are of normal size. No significant extraaxial fluid collection is present. The brainstem and cerebellum are within normal limits. Vascular: No hyperdense vessel or unexpected calcification. Skull: Right supraorbital soft tissue swelling and hematoma is present. No underlying fracture is present. Calvarium otherwise within normal limits. No other significant extracranial soft tissue injury is present. Sinuses/Orbits: Mild mucosal thickening is present in the maxillary sinuses bilaterally. No air-fluid levels are present. The globes and orbits are within normal limits. CT CERVICAL SPINE FINDINGS Alignment: No significant listhesis is present. Cervical lordosis preserved. Skull base and vertebrae: Craniocervical junction is within normal  limits. Vertebral body heights are normal. Narrow acute or healing fractures are present. Soft tissues and spinal canal: No prevertebral fluid or swelling. No visible canal hematoma. Esophageal mass the thoracic inlet again noted. Edema along the right vocal cord improved. Right IJ Port-A-Cath in place. Disc  levels: Uncovertebral spurring leads to moderate foraminal narrowing at C3-4 and C5-6, left greater than right. Upper chest: Lung apices are clear. IMPRESSION: 1. Right supraorbital soft tissue swelling and hematoma without underlying fracture. 2. Normal CT appearance of the brain. 3. No acute fracture or traumatic subluxation in the cervical spine. 4. Multilevel degenerative changes of the cervical spine as described. Electronically Signed   By: San Morelle M.D.   On: 08/08/2021 13:54   CT Angio Chest PE W and/or Wo Contrast  Result Date: 08/08/2021 CLINICAL DATA:  History metastatic or pharyngeal squamous carcinoma. Found on floor at home and hypoxic. Elevated white blood cell count, D-dimer and lipase. EXAM: CT ANGIOGRAPHY CHEST WITH CONTRAST TECHNIQUE: Multidetector CT imaging of the chest was performed using the standard protocol during bolus administration of intravenous contrast. Multiplanar CT image reconstructions and MIPs were obtained to evaluate the vascular anatomy. CONTRAST:  78m OMNIPAQUE IOHEXOL 350 MG/ML SOLN COMPARISON:  Prior CT of the chest on 04/01/2021 FINDINGS: Cardiovascular: The pulmonary arteries are adequately opacified. There is no evidence of pulmonary embolism. Central pulmonary arteries are normal in caliber. The thoracic aorta is normal in caliber. Stable heart size. Stable calcified coronary artery plaque. 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 No evidence of mediastinal or hilar lymphadenopathy. Increased prominence of some adjacent  superior left axillary lymph nodes with the largest measuring approximately 1 cm in short axis. Lungs/Pleura: New extensive airspace consolidation and volume loss of the right lower lobe and right middle lobe with visible occlusive debris in the bronchus intermedius and scattered small air bronchograms in consolidated lung. Visible nearly occlusive debris in the left mainstem bronchus and debris in the left lower lobe bronchus with near complete atelectasis of the left lower lobe. Focal airspace disease in the medial aspect of the right middle lobe and posterior left upper lobe. Pulmonary findings are consistent with bilateral pneumonia and highly suggestive of aspiration pneumonia given the extensive debris in the airways. No significant associated pleural fluid or evidence of pneumothorax. Known metastatic pulmonary nodule in the superior right upper lobe shows interval enlargement since the prior study in April. This previously measured 8 mm and now measures 13 x 13 mm. Metastatic mass in the inferior aspect of the right upper lobe previously measuring 1.2 x 1.3 cm shows enlargement and now measures approximately 2.8 x 2.9 cm. Previously noted right lower lobe pulmonary nodule is not visible on today study due to the extensive consolidation and volume loss of the right lower lobe. Musculoskeletal: No chest wall abnormality. No acute or significant osseous findings. Review of the MIP images confirms the above findings. IMPRESSION: 1. No evidence of pulmonary embolism. 2. New extensive airspace consolidation and volume loss of the right lower lobe, right middle lobe and left lower lobe with significant debris in the right bronchus intermedius, left mainstem bronchus and left lower lobe bronchus. Additional airspace disease in the right middle lobe and left upper lobe. Findings are suggestive of bilateral aspiration pneumonia. 3. Progression of metastatic disease in the right lung with enlargement of superior right  upper lobe and inferior right upper lobe nodules as noted above. The previously noted right lower lobe nodule cannot be assessed as it is obscured by consolidation of the right lower lobe. 4. 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 5. Increased prominence of left axillary lymph nodes which  only measure 1 cm in short axis. Electronically Signed   By: Aletta Edouard M.D.   On: 08/08/2021 14:11   CT Cervical Spine Wo Contrast  Result Date: 08/08/2021 CLINICAL DATA:  Found on floor by family. Personal history of esophageal cancer. Feeding tube. Mental status change. Hypoxia. EXAM: CT HEAD WITHOUT CONTRAST CT CERVICAL SPINE WITHOUT CONTRAST TECHNIQUE: Multidetector CT imaging of the head and cervical spine was performed following the standard protocol without intravenous contrast. Multiplanar CT image reconstructions of the cervical spine were also generated. COMPARISON:  MRI had without and with contrast 04/10/2021 FINDINGS: CT HEAD FINDINGS Brain: No acute infarct, hemorrhage, or mass lesion is present. No significant white matter lesions are present. The ventricles are of normal size. No significant extraaxial fluid collection is present. The brainstem and cerebellum are within normal limits. Vascular: No hyperdense vessel or unexpected calcification. Skull: Right supraorbital soft tissue swelling and hematoma is present. No underlying fracture is present. Calvarium otherwise within normal limits. No other significant extracranial soft tissue injury is present. Sinuses/Orbits: Mild mucosal thickening is present in the maxillary sinuses bilaterally. No air-fluid levels are present. The globes and orbits are within normal limits. CT CERVICAL SPINE FINDINGS Alignment: No significant listhesis is present. Cervical lordosis preserved. Skull base and vertebrae: Craniocervical junction is within normal limits. Vertebral body heights are normal. Narrow acute or healing fractures are  present. Soft tissues and spinal canal: No prevertebral fluid or swelling. No visible canal hematoma. Esophageal mass the thoracic inlet again noted. Edema along the right vocal cord improved. Right IJ Port-A-Cath in place. Disc levels: Uncovertebral spurring leads to moderate foraminal narrowing at C3-4 and C5-6, left greater than right. Upper chest: Lung apices are clear. IMPRESSION: 1. Right supraorbital soft tissue swelling and hematoma without underlying fracture. 2. Normal CT appearance of the brain. 3. No acute fracture or traumatic subluxation in the cervical spine. 4. Multilevel degenerative changes of the cervical spine as described. Electronically Signed   By: San Morelle M.D.   On: 08/08/2021 13:54   CT ABDOMEN PELVIS W CONTRAST  Result Date: 08/08/2021 CLINICAL DATA:  History of metastatic oropharyngeal squamous carcinoma. Found on floor at home and hypoxic. Elevated white blood cell count, D-dimer and lipase. EXAM: CT ABDOMEN AND PELVIS WITH CONTRAST TECHNIQUE: Multidetector CT imaging of the abdomen and pelvis was performed using the standard protocol following bolus administration of intravenous contrast. CONTRAST:  76m OMNIPAQUE IOHEXOL 350 MG/ML SOLN COMPARISON:  CT of the abdomen without contrast on 09/15/2020 and PET scan on 11/09/2020 FINDINGS: Hepatobiliary: Stable left lobe hepatic cyst. Gallbladder and bile ducts are unremarkable. Pancreas: Unremarkable. No pancreatic ductal dilatation or surrounding inflammatory changes. Spleen: Normal in size without focal abnormality. Adrenals/Urinary Tract: Stable right renal cyst. No hydronephrosis or calculi. No solid renal masses or adrenal masses. The bladder is unremarkable. Stomach/Bowel: Gastrostomy tube present with normal positioning within the lumen of the stomach. No evidence of bowel obstruction, ileus or inflammation. No mass lesions are seen involving the bowel. Diverticulosis of the descending and sigmoid colon without evidence  of diverticulitis. No free intraperitoneal air. Vascular/Lymphatic: No significant vascular findings are present. No enlarged abdominal or pelvic lymph nodes. Reproductive: Prostate is unremarkable. Other: No abdominal wall hernia or abnormality. No abdominopelvic ascites. Musculoskeletal: No acute or significant osseous findings. IMPRESSION: No acute findings in the abdomen or pelvis. Gastrostomy tube is present and in appropriate position. Electronically Signed   By: GAletta EdouardM.D.   On: 08/08/2021 14:22    Review  of Systems Blood pressure 132/87, pulse 99, temperature (!) 97.5 F (36.4 C), resp. rate 20, height '5\' 10"'$  (1.778 m), weight 85.8 kg, SpO2 95 %. Physical Exam He has significant weakness to the right upper extremity with difficulty bending his fingers he is unable to actively flex the elbow against gravity and cannot raise the arm.  He has slight ability to extend the elbow against force.  On raising the arm at appears flaccid when letting it go.  His hand has thenar atrophy and diminished sensation. Assessment/Plan: He has an underlying carpal tunnel but this right shoulder elbow and hand weakness I think is probably from prior location and would be concerned about possible CVA causing this profound weakness to the right upper extremity.  I recommend physical therapy and hand therapy to maintain motion but would recommend neurology evaluation for this profound weakness that appears to be new.  Hessie Knows 08/10/2021, 12:09 PM

## 2021-08-11 ENCOUNTER — Inpatient Hospital Stay: Payer: Medicare Other

## 2021-08-11 ENCOUNTER — Encounter: Payer: Self-pay | Admitting: Internal Medicine

## 2021-08-11 DIAGNOSIS — I639 Cerebral infarction, unspecified: Secondary | ICD-10-CM

## 2021-08-11 LAB — GLUCOSE, CAPILLARY
Glucose-Capillary: 108 mg/dL — ABNORMAL HIGH (ref 70–99)
Glucose-Capillary: 112 mg/dL — ABNORMAL HIGH (ref 70–99)
Glucose-Capillary: 115 mg/dL — ABNORMAL HIGH (ref 70–99)
Glucose-Capillary: 132 mg/dL — ABNORMAL HIGH (ref 70–99)
Glucose-Capillary: 94 mg/dL (ref 70–99)
Glucose-Capillary: 98 mg/dL (ref 70–99)

## 2021-08-11 MED ORDER — ASPIRIN EC 81 MG PO TBEC
81.0000 mg | DELAYED_RELEASE_TABLET | Freq: Every day | ORAL | Status: DC
  Administered 2021-08-11 – 2021-08-12 (×2): 81 mg via ORAL
  Filled 2021-08-11 (×2): qty 1

## 2021-08-11 MED ORDER — IOHEXOL 350 MG/ML SOLN
75.0000 mL | Freq: Once | INTRAVENOUS | Status: AC | PRN
  Administered 2021-08-11: 22:00:00 75 mL via INTRAVENOUS

## 2021-08-11 MED ORDER — ATORVASTATIN CALCIUM 20 MG PO TABS
80.0000 mg | ORAL_TABLET | Freq: Every day | ORAL | Status: DC
  Administered 2021-08-11: 21:00:00 80 mg
  Filled 2021-08-11: qty 4

## 2021-08-11 NOTE — Consult Note (Signed)
Neurology Consultation Reason for Consult: Possible stroke on MRI Requesting Physician: Shelly Coss  CC: Right arm weakness  History is obtained from: Patient, sister [name] at bedside and chart review  HPI: Ivan Garcia. is a 65 y.o. male with a past medical history significant for squamous cell carcinoma of the oropharynx (diagnosed 12/14/2017, status post multiple rounds of chemotherapy, radiation), feeding tube, hypertension, hyperlipidemia, arthritis.  He has been having progressive symptoms of the right upper extremity since at least May, possibly as far back as January or February.  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  This then progressed to tingling in his entire hand.  Sister additionally notes that at one point he was having severe right upper extremity pain unrelieved even by morphine, without significant weakness at the time.  He was evaluated by Dr. Mickeal Skinner as well as Dr. Melrose Nakayama and an EMG/nerve conduction study was completed recently (though I am unable to access any report) reportedly consistent with a right carpal tunnel syndrome.  He has had 2 steroid injections which have not helped his symptoms in any way and in fact he feels may have worsened his symptoms.  Additionally he has developed ptosis of the right eye and myosis though he has not noticed an hydrocyst of the outside of his face, which has also been ongoing for at least 2 or 3 months but is not otherwise chronic.  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 Able to look after own affairs without assistance, but unable to carry out all previous activities.   ROS: All other review of systems was negative except as noted in the HPI.   Past Medical History:  Diagnosis Date   Arthritis    Cancer ([place])    Head and neck cancer   Hemorrhoids    Hyperlipidemia    Hypertension    Past Surgical History:  Procedure Laterality Date   COLONOSCOPY WITH PROPOFOL N/A 04/04/2017   Procedure: COLONOSCOPY WITH PROPOFOL;  Surgeon: Robert Bellow, MD;  Location: [facility];  Service: Endoscopy;  Laterality: N/A;   IR GASTROSTOMY TUBE MOD SED  09/24/2020   MANDIBLE SURGERY     PORT-A-CATH REMOVAL  07/17/2018   PORTA CATH INSERTION N/A 12/19/2017   Procedure: PORTA CATH INSERTION;  Surgeon: Algernon Huxley, MD;  Location: [place] [facility];  Service: Cardiovascular;  Laterality: N/A;   PORTA CATH INSERTION N/A 07/17/2018   Procedure: PORTA CATH INSERTION;  Surgeon: Algernon Huxley, MD;  Location: [place] [facility];  Service: Cardiovascular;  Laterality: N/A;   PORTA CATH INSERTION N/A 09/18/2019   Procedure: PORTA CATH  INSERTION;  Surgeon: Algernon Huxley, MD;  Location: [place] [facility];  Service: Cardiovascular;  Laterality: N/A;   TONSILLECTOMY     Current Outpatient Medications  Medication Instructions   acetaminophen (TYLENOL) 160 MG/5ML solution Take by mouth.   amLODipine (NORVASC) 5 mg, Oral, Daily   atorvastatin (LIPITOR) 20 MG tablet SMARTSIG:1 Tablet(s) By Mouth Every Evening   cetirizine (ZYRTEC) 10 MG tablet Take by mouth.   famotidine (PEPCID) 20 MG tablet Take by mouth.   gabapentin (NEURONTIN) 100 MG capsule  TAKE 2 CAPSULES BY MOUTH 3 TIMES DAILY.   HYDROcodone-acetaminophen (NORCO) 10-325 MG tablet 1 tablet, Oral, Every 6 hours PRN   ibuprofen (ADVIL) 100 MG/5ML suspension Take by mouth.   Lenvima (20 MG Daily Dose) 20 mg, Oral, Daily   levothyroxine (SYNTHROID) 50 mcg, Oral, Daily before breakfast   lidocaine-prilocaine (EMLA) cream Apply to affected area once   metoprolol succinate (TOPROL-XL) 25 MG 24 hr tablet 1 tablet, Daily   nystatin (MYCOSTATIN) 500,000 Units, Oral, 3 times daily, SWISH AND SPIT   omeprazole (PRILOSEC) 20 mg, Oral, Daily   ondansetron (ZOFRAN) 8 mg, Oral, 2 times daily PRN, Start on day 3 after carboplatin chemo.   oxyCODONE (ROXICODONE) 5 MG/5ML solution PLACE 5-10 MLS (5-10 MG TOTAL) INTO FEEDING TUBE EVERY 4 (FOUR) HOURS AS NEEDED FOR SEVERE PAIN.   predniSONE (DELTASONE) 50 mg, Oral, Daily with breakfast   pregabalin (LYRICA) 150 mg, Oral, 2 times daily   prochlorperazine (COMPAZINE) 10 mg, Oral, Every 6 hours PRN   senna-docusate (SENOKOT-S) 8.6-50 MG tablet 2 tablets, Oral, Daily    Family History  Problem Relation Age of Onset   Breast cancer Mother    Asthma Mother    Congestive Heart Failure Mother    Prostate cancer Father    Brain cancer Father    Bladder Cancer Father    Social History:  reports that he has never smoked. He has never used smokeless tobacco. He reports that he does not drink alcohol and does not use drugs.   Exam: Current vital signs: BP 114/84 (BP Location: Left Arm)   Pulse 93   Temp 97.7 F (36.5 C)   Resp (!) 23   Ht '5\' 10"'$  (1.778 m)   Wt 85.8 kg   SpO2 93%   BMI 27.14 kg/m  Vital signs in last 24 hours: Temp:  [97.7 F (36.5 C)-98.7 F (37.1 C)] 97.7 F (36.5 C) (09/08 0820) Pulse Rate:  [91-100] 93 (09/08 0820) Resp:  [16-23] 23 (09/08 0820) BP: (114-155)/(84-102) 114/84 (09/08 0820) SpO2:  [89 %-95 %] 93 % (09/08 0820)   Physical Exam  Constitutional: Appears fatigued and chronically ill but not in any acute  distress Psych: Affect appropriate to situation, calm and cooperative Eyes: No scleral injection HENT: No oropharyngeal obstruction.  MSK: no joint deformities.  Cardiovascular: Normal rate and regular rhythm.  Respiratory: Effort normal, non-labored breathing GI: Soft.  No distension. There is no tenderness.  Skin: Warm dry and intact visible skin  Neuro: Mental Status: Patient is awake, alert, oriented to person, place, month, year, and situation. Patient is able to give a clear and coherent history. No signs of aphasia or neglect Cranial Nerves: II: Visual Fields are full. Pupils are notably anisocoric with the right pupil smaller than the left, most notable in direct lighting.  Both are reactive III,IV, VI: EOMI with notable right eye ptosis V: Facial sensation is symmetric to temperature and light touch VII: Facial movement is symmetric  other than mild weakness of the right eye closure.  VIII: hearing is intact to voice and tuning fork X: Uvula elevates symmetrically XI: Shoulder shrug is symmetric. XII: tongue is midline without atrophy or fasciculations.  Motor: Tone is normal. Bulk is normal. 5/5 strength EXCEPT right upper extremity, 1/5 deltoid, 2/5 elbow extension and flexion, 4+/5 wrist extension, 4/5 wrist flexion, 4/5 finger extension and 5/5 finger flexion Sensory: Sensation is symmetric to light touch and temperature in the arms and legs.  There is no loss of sensation to temperature, pinprick, or vibration in bilateral upper extremities, there is vibratory loss in the bilateral toes (senses vibration for only 1 to 3 seconds) Deep Tendon Reflexes: Reduced in the right upper extremity compared to the left (1+ pectoralis, biceps and brachioradialis, compared to 2+ on the left), 2+ and symmetric patellar's, absent Achilles Plantars: Toes are downgoing on the left and mute on the right Cerebellar: FNF and HKS are intact bilaterally  NIHSS total 3 Score breakdown: 3  points for right upper extremity weakness    I have reviewed labs in epic and the results pertinent to this consultation are: Creatinine 0.7   Basic Metabolic Panel: Recent Labs  Lab 08/08/21 0919 08/08/21 1335 08/09/21 0610 08/10/21 0533  NA 132* 126* 133* 133*  K 4.8 4.4 4.0 4.1  CL 96* 95* 99 99  CO2 '28 24 29 27  '$ GLUCOSE 141* 272* 136* 98  BUN 35* 28* 25* 21  CREATININE 1.87* 1.44* 0.96 0.70  CALCIUM 9.1 8.0* 8.7* 8.1*  MG  --   --  2.5*  --   PHOS  --   --  3.8  --     CBC: Recent Labs  Lab 08/08/21 0919 08/09/21 0610 08/10/21 0533  WBC 19.8* 9.0 9.6  NEUTROABS 17.7* 8.0* 8.7*  HGB 14.6 13.8 12.9*  HCT 42.6 39.8 37.3*  MCV 91.6 91.5 91.4  PLT 390 321 306    No results found for: CHOL, HDL, LDLCALC, LDLDIRECT, TRIG, CHOLHDL  Lab Results  Component Value Date   HGBA1C 6.0 (H) 08/09/2021     I have reviewed the images obtained:  MRI brain personally reviewed 08/10/21 1. Two subtle adjacent punctate foci of diffusion abnormality involving the subcortical aspect of the posterior right frontal region, suspicious for tiny acute to early subacute ischemic infarcts. No associated hemorrhage or mass effect. 2. Asymmetric FLAIR signal intensity involving the right transverse and sigmoid sinus. While this finding is favored to reflect slow/sluggish flow, possible thrombus is difficult to exclude. Further assessment with dedicated MRV, with and without contrast, suggested for further evaluation. 3. Otherwise normal brain MRI for age.  MRI cervical spine personally reviewed 08/10/21 1. Degenerative disc osteophyte at C5-6 with resultant mild spinal stenosis, with severe bilateral C6 foraminal narrowing. 2. Left eccentric disc osteophyte and facet arthrosis at C3-4 with resultant mild left-sided spinal stenosis, with moderate left C4 foraminal narrowing.  3. More mild multilevel spondylosis elsewhere within the cervical spine as above. No other significant stenosis or  neural impingement. 4. Post treatment changes within the visualized neck with associated right paraesophageal nodal conglomerate, partially visualized, but grossly similar to previous neck CT from 01/05/2021.  MRI cervical spine 05/31/2021 personally reviewed, agree with radiology read Post radiation changes in the bone marrow of the cervical spine. Stable small areas of bone marrow edema and enhancement C5 through T1 anteriorly which may be due to tumor which has been treated and is stable.  Soft tissue mass to the  right of the esophagus at the thoracic inlet grossly stable from prior CT.  CT angio chest PE protocol 08/08/21 personally reviewed, agree with radiology 1. No evidence of pulmonary embolism. 2. New extensive airspace consolidation and volume loss of the right lower lobe, right middle lobe and left lower lobe with significant debris in the right bronchus intermedius, left mainstem bronchus and left lower lobe bronchus. Additional airspace disease in the right middle lobe and left upper lobe. Findings are suggestive of bilateral aspiration pneumonia. 3. Progression of metastatic disease in the right lung with enlargement of superior right upper lobe and inferior right upper lobe nodules as noted above. The previously noted right lower lobe nodule cannot be assessed as it is obscured by consolidation of the right lower lobe.  4. 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 5. Increased prominence of left axillary lymph nodes which only measure 1 cm in short axis.  Impression: This is a 65 year old gentleman with progressive weakness of the right upper extremity, on my examination, and involvement of the deltoids and elbow flexion and extension without significant sensory symptoms though he does have reduced reflexes.  This is concerning for peripheral nerve localization.  Notably he has known cervical spine enhancement from June concerning for spread of  his malignancy.  Radiation can also result in brachial plexopathy, as can direct invasion by malignancy (and these entities can be hard to distinguish).  Given the progressive nature of his malignancy, I am very concerned about the possibility of a malignant brachial plexitis.  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  Again this could be from his known C-spine malignancy versus involvement along the carotid artery.  While he has been previously diagnosed with carpal tunnel syndrome this would obviously not explain deltoid nor bicep/tricep weakness, therefore I do not think his possible carpal tunnel is contributory at this time  Regarding MRI brain findings, this punctate lesion is highly unlikely to explain his symptoms.  Nevertheless, we will complete stroke work-up to optimize him from a stroke risk perspective.  MRI brain was also read with concern for potential venous thrombosis, doubt this based on patient denying any symptoms typical for that condition.  Nevertheless we will obtain CTV for completeness  Recommendations:  # Possible punctate stroke - Stroke labs HgbA1c, fasting lipid panel - CTA head and neck, CTV given MRI findings concerning for possible dural venous phimosis - Frequent neuro checks - Echocardiogram - Prophylactic therapy-Antiplatelet med: Aspirin  81 mg daily - Risk factor modification discussed with patient - Telemetry monitoring - Blood pressure goal   - Normotension given very small stroke versus artifact  #Progressive right upper extremity weakness -MRI right brachial plexus protocol (MRI chest with and without contrast with attention to the brachial plexus as well as right carotid given coronary syndrome) -MRI cervical spine with contrast for better characterization of potential progression of cervical spine disease -Neurology will continue to follow  Lesleigh Noe MD-PhD [facility] [phone] [facility] coverage for [facility] is from 8 AM to 4 AM in-house and 4 PM to 8 PM by telephone/video. 8 PM to 8 AM emergent questions or overnight urgent questions should be addressed to Teleneurology On-call or [facility] neuro[facility]ist; contact information can be found on AMION

## 2021-08-11 NOTE — Progress Notes (Signed)
Occupational Therapy Treatment Patient Details Name: Ivan Garcia. MRN: [id number] DOB: 1956-05-15 Today's Date: 08/11/2021    History of present illness Pt is a 65 y.o. M arriving to ED following a fall and hypoxemia & pneumonia. PMH includes esophogeal cancer, HTN, carpal tunnel syndrome, chronic pain. Pt is NPO w/ feeding tube at baseline. Of note, imaging now reveals subtle adjacent punctate foci of diffusion abnormality  involving the subcortical aspect of the posterior right frontal  region, suspicious for tiny acute to early subacute ischemic  infarcts. No associated hemorrhage or mass effect,   OT comments  Chart reviewed, pt greeted in bed, agreeable to tx session. Tx session targeted education re: RUE HEP, progressing functional mobility and improving independent ADL completion. Pt provided written HEP hand out  medbridge access code [id number], demonstrating RUE AAROM shoulder flexion, elbow flexion/extension, wrist flexion/extension. Pt demonstrates task with supervision, frequent vcs. Pt appears to be progressing in all functional mobility, ADL completion however is not performing at PLOF. Pt will continue to benefit from ongoing OT to address functional deficits. OT continues to recommend discharge to SNF To address functional deficits.   Follow Up Recommendations  SNF    Equipment Recommendations  Other (comment) (per next venue of care)    Recommendations for Other Services      Precautions / Restrictions Precautions Precautions: Fall Precaution Comments: PEG tube Restrictions Weight Bearing Restrictions: No       Mobility Bed Mobility Overal bed mobility: Needs Assistance Bed Mobility: Supine to Sit     Supine to sit: Min assist          Transfers Overall transfer level: Needs assistance Equipment used: Right platform walker Transfers: Sit to/from Stand;Stand Pivot Transfers Sit to Stand: Min assist Stand pivot transfers: Min assist             Balance Overall balance assessment: Needs assistance Sitting-balance support: Feet supported;Single extremity supported Sitting balance-Leahy Scale: Good     Standing balance support: During functional activity;Bilateral upper extremity supported Standing balance-Leahy Scale: Fair                             ADL either performed or assessed with clinical judgement   ADL Overall ADL's : Needs assistance/impaired     Grooming: Wash/dry hands;Wash/dry face;Oral care Grooming Details (indicate cue type and reason): set up seated at edge of bed     Lower Body Bathing: Maximal assistance Lower Body Bathing Details (indicate cue type and reason): for peri care Upper Body Dressing : Minimal assistance Upper Body Dressing Details (indicate cue type and reason): seated at edge of bed Lower Body Dressing: Maximal assistance Lower Body Dressing Details (indicate cue type and reason): to don socks     Toileting- Clothing Manipulation and Hygiene: Maximal assistance Toileting - Clothing Manipulation Details (indicate cue type and reason): for peri care     Functional mobility during ADLs: Minimal assistance;Rolling walker       Vision       Perception     Praxis      Cognition Arousal/Alertness: Awake/alert Behavior During Therapy: WFL for tasks assessed/performed Overall Cognitive Status: Within Functional Limits for tasks assessed                                          Exercises General Exercises - Upper  Extremity Shoulder Flexion: AAROM;Right;10 reps;Seated Elbow Flexion: AAROM;Right;10 reps;Seated Elbow Extension: AAROM;Right;10 reps;Seated Wrist Flexion: AROM;Right;10 reps;Seated Wrist Extension: AROM;Right;10 reps;Seated   Shoulder Instructions       General Comments      Pertinent Vitals/ Pain       Pain Score: 0-No pain                                                          Frequency  Min 1X/week         Progress Toward Goals  OT Goals(current goals can now be found in the care plan section)  Progress towards OT goals: Progressing toward goals  Acute Rehab OT Goals Potential to Achieve Goals: Good  Plan Discharge plan remains appropriate    Co-evaluation                 AM-PAC OT "6 Clicks" Daily Activity     Outcome Measure   Help from another person eating meals?: Total Help from another person taking care of personal grooming?: A Little Help from another person toileting, which includes using toliet, bedpan, or urinal?: A Lot Help from another person bathing (including washing, rinsing, drying)?: A Little Help from another person to put on and taking off regular upper body clothing?: A Little Help from another person to put on and taking off regular lower body clothing?: A Lot 6 Click Score: 14    End of Session Equipment Utilized During Treatment: Gait belt;Other (comment)  OT Visit Diagnosis: Unsteadiness on feet (R26.81);Pain   Activity Tolerance Patient tolerated treatment well   Patient Left in chair;with call bell/phone within reach;with chair alarm set;Other (comment)   Nurse Communication Mobility status        Time: 0930-1000 OT Time Calculation (min): 30 min  Charges: OT General Charges $OT Visit: 1 Visit OT Treatments $Self Care/Home Management : 8-22 mins $Therapeutic Activity: 8-22 mins $Therapeutic Exercise: 8-22 mins  Shanon Payor, OTD OTR/L  08/11/21, 12:12 PM

## 2021-08-11 NOTE — Progress Notes (Signed)
Physical Therapy Treatment Patient Details Name: Ivan Garcia. MRN: [id number] DOB: 16-Oct-1956 Today's Date: 08/11/2021    History of Present Illness Pt is a 65 y.o. M arriving to ED following a fall and hypoxemia & pneumonia. PMH includes esophogeal cancer, HTN, carpal tunnel syndrome, chronic pain. Pt is NPO w/ feeding tube at baseline. Of note, imaging now reveals subtle adjacent punctate foci of diffusion abnormality  involving the subcortical aspect of the posterior right frontal  region, suspicious for tiny acute to early subacute ischemic  infarcts. No associated hemorrhage or mass effect,    PT Comments    Pt fatigued this session, falling asleep seated EOB, awakens to tactile/verbal cues. Pleasant and cooperative w/ therapy throughout session. Pt's RUE remains flaccid w/ movement occurring only through digits & wrist. A RW w/ platform attachment adjusted and used for transfers and ambulation. Pt requires min-A for supine <> sit transfers for trunk support. Transfers w/ min-A for RW stabilization, RUE management, and slight physical assist during task initiation. Progressed ambulation distance w/ RW, min-guard w/ pt limited by SOB and fatigue. Vitals assessed throughout session, SpO2 88-90% on room air w/ ambulation. SNF remains current discharge recommendation due to change from PLOF, weakness, decreased endurance, and change in functional mobility necessary for ADLs. Skilled PT intervention is indicated to address deficits in function, mobility, and to return to PLOF as able.     Follow Up Recommendations  SNF;Supervision for mobility/OOB     Equipment Recommendations  Other (comment) (TBD next venue of care)    Recommendations for Other Services       Precautions / Restrictions Precautions Precautions: Fall Precaution Comments: PEG tube Restrictions Weight Bearing Restrictions: No    Mobility  Bed Mobility Overal bed mobility: Needs Assistance Bed Mobility: Supine  to Sit;Sit to Supine     Supine to sit: Min assist Sit to supine: Min assist        Transfers Overall transfer level: Needs assistance Equipment used: Right platform walker Transfers: Sit to/from Stand Sit to Stand: Min assist Stand pivot transfers: Min assist       General transfer comment: min-A for task initiation & RW stabilization, placement of RUE into platform attachment  Ambulation/Gait Ambulation/Gait assistance: Min guard Gait Distance (Feet): 17 Feet Assistive device: Right platform walker Gait Pattern/deviations: Step-to pattern;Decreased step length - left;Decreased step length - right;Trunk flexed     General Gait Details: Min-gaurd for safety   Stairs             Wheelchair Mobility    Modified Rankin (Stroke Patients Only)       Balance Overall balance assessment: Needs assistance Sitting-balance support: Feet supported;Single extremity supported Sitting balance-Leahy Scale: Good     Standing balance support: During functional activity;Bilateral upper extremity supported Standing balance-Leahy Scale: Fair                              Cognition Arousal/Alertness: Lethargic Behavior During Therapy: WFL for tasks assessed/performed Overall Cognitive Status: Within Functional Limits for tasks assessed                                 General Comments: Reiterates comments, AOx3      Exercises General Exercises - Upper Extremity Shoulder Flexion: AAROM;Right;10 reps;Seated Elbow Flexion: AAROM;Right;10 reps;Seated Elbow Extension: AAROM;Right;10 reps;Seated Wrist Flexion: AROM;Right;10 reps;Seated Wrist Extension: AROM;Right;10 reps;Seated  General Comments        Pertinent Vitals/Pain Pain Assessment: No/denies pain Pain Score: 0-No pain    Home Living                      Prior Function            PT Goals (current goals can now be found in the care plan section) Progress towards PT  goals: Progressing toward goals    Frequency    Min 2X/week      PT Plan Current plan remains appropriate    Co-evaluation              AM-PAC PT "6 Clicks" Mobility   Outcome Measure  Help needed turning from your back to your side while in a flat bed without using bedrails?: A Little Help needed moving from lying on your back to sitting on the side of a flat bed without using bedrails?: A Little Help needed moving to and from a bed to a chair (including a wheelchair)?: A Little Help needed standing up from a chair using your arms (e.g., wheelchair or bedside chair)?: A Little Help needed to walk in hospital room?: A Lot Help needed climbing 3-5 steps with a railing? : A Lot 6 Click Score: 16    End of Session Equipment Utilized During Treatment: Gait belt Activity Tolerance: Patient tolerated treatment well Patient left: in bed;with call bell/phone within reach;with family/visitor present;with bed alarm set   PT Visit Diagnosis: Other abnormalities of gait and mobility (R26.89);Muscle weakness (generalized) (M62.81);History of falling (Z91.81);Other symptoms and signs involving the nervous system (R29.898)     Time: [id number] PT Time Calculation (min) (ACUTE ONLY): 53 min  Charges:                        [employer], SPT

## 2021-08-11 NOTE — Progress Notes (Signed)
PROGRESS NOTE    Ivan Garcia.  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  On presentation he was found to be hypoxemic.  Patient has severe dysphagia and has been strictly n.p.o. and gets nutrition from the tube feed.  On presentation, he was tachycardic, blood pressure was stable, he required 5 L of oxygen for maintenance of saturation.  Lab work showed hyponatremia, elevated glucose, creatinine of 1.44.  COVID screen test negative.  Chest CT scan showed multilobar aspiration pneumonia.  Started on  antibiotics.  Orthopedics consulted for severe carpal tunnel syndrome of the right hand.  He was found to have new onset weakness of right upper extremity MRI of the brain showed acute/subacute ischemic infarct on the right frontal lobe.  Neurology consulted today.  Assessment & Plan:   Active Problems:   Aspiration pneumonia (HCC)   Acute hypoxemic respiratory failure (HCC)   Sepsis (HCC)   Acute hypoxic respiratory failure secondary to multilobar aspiration pneumonia/severe sepsis: Met septic criteria with tachycardia, leukocytosis, lactic acidosis, AKI.  Required 5 L of oxygen for maintenance of saturation on admission. Started on broad-spectrum antibiotics with ceftriaxone and Flagyl .  He is currently on room air.  Right upper extremity weakness: He was found to have new onset weakness of right upper extremity .MRI of the brain showed acute/subacute ischemic infarct on the right frontal lobe.  MRI of the cervical spine showed severe bilateral foraminal narrowing, degenerative changes, no significant spinal stenosis.Neurology consulted today.  Further  work-up as per neurology.  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  Strictly n.p.o. at home. Follows with oncology as an outpatient.On lenvatinib and also on immunotherapy  AKI/hyponatremia: Secondary to dehydration, rhabdomyolysis.  Resolved with IV fluids  Fall: Has problem with ambulation.  Had bruise on the forehead after the fall.  CT scan did not show any intracranial hemorrhage. PT/OT recommended SNF  History of hypertension: On amlodipine at home.  Currently blood pressure stable  Hyperlipidemia: On Lipitor at home.  Hypothyroidism: On Synthyroid at home.  Diabetes type 2: Hyperglycemic in presentation.  Started on sliding scale insulin.  Monitor blood sugars.HbA1c of 6.0  Right hand carpal tunnel syndrome/chronic pain syndrome: Follows with orthopedics.  Recently had a steroid shot for carpal tunnel Syndrome.  Impaired mobility on the right hand with pain and swelling,now much better.  Continue pain medicines/supportive care.ortho consulted here.  Debility/deconditioning: Has profound right upper extremity weakness.  PT/OT recommending skilled nursing facility on discharge    Nutrition Problem: Increased nutrient needs Etiology: cancer and cancer related treatments      DVT prophylaxis:Lovenox Code Status: Full Family Communication: Discussed with the sister face-to-face on 08/11/2021 Status is: Inpatient  Remains inpatient appropriate because:Inpatient level of care appropriate due to severity of illness  Dispo: The patient is from: Home              Anticipated d/c is to: skilled nursing facility              Patient currently is not medically stable to d/c.   Difficult to place patient No     Consultants: None  Procedures:None  Antimicrobials:  Anti-infectives (From admission, onward)    Start     Dose/Rate Route Frequency Ordered Stop   08/08/21 1615  cefTRIAXone (ROCEPHIN) 2 g in sodium  chloride 0.9 % 100 mL IVPB        2 g 200 mL/hr over 30 Minutes Intravenous Every 24 hours 08/08/21 1610 08/13/21 1614   08/08/21 1615  metroNIDAZOLE (FLAGYL) IVPB 500 mg        500 mg 100 mL/hr over 60 Minutes Intravenous Every 8 hours 08/08/21 1610     08/08/21 1045  ceFEPIme (MAXIPIME) 2 g in sodium chloride 0.9 % 100 mL IVPB        2 g 200 mL/hr over 30 Minutes Intravenous  Once 08/08/21 1039 08/08/21 1122   08/08/21 1045  metroNIDAZOLE (FLAGYL) IVPB 500 mg        500 mg 100 mL/hr over 60 Minutes Intravenous  Once 08/08/21 1039 08/08/21 1218   08/08/21 1045  vancomycin (VANCOCIN) IVPB 1000 mg/200 mL premix        1,000 mg 200 mL/hr over 60 Minutes Intravenous  Once 08/08/21 1039 08/08/21 1333       Subjective:  Patient seen and examined the bedside this morning.  Hemodynamically stable during my evaluation.  He was on room air.  Denies any worsening cough or shortness of breath.  He still has severe right upper extremity weakness.  Swelling of the right wrist and hand has improved  Objective: Vitals:   08/10/21 2018 08/10/21 2043 08/11/21 0029 08/11/21 0419  BP: (!) 155/102 (!) 141/87 (!) 122/95 (!) 141/92  Pulse: 100  99 91  Resp: _0 Temp: 98 F (36.7 C)  98 F (36.7 C) 98.1 F (36.7 C)  TempSrc:      SpO2: (!) 89% 92% 91% 93%  Weight:      Height:        Intake/Output Summary (Last 24 hours) at 08/11/2021 0801 Last data filed at 08/11/2021 0600 Gross per 24 hour  Intake 780 ml  Output 1150 ml  Net -370 ml   Filed Weights   08/08/21 1728  Weight: 85.8 kg    Examination:  General exam: Overall comfortable, not in distress HEENT: PERRL Respiratory system:  no wheezes or crackles  Cardiovascular system: S1 & S2 heard, RRR.  Gastrointestinal system: Abdomen is nondistended, soft and nontender.PEG Central nervous system: Alert and oriented.  Weakness of right upper extremity with motor of 2/5 Extremities: No edema, no clubbing ,no cyanosis Skin: No  rashes, no ulcers,no icterus       Data Reviewed: I have personally reviewed following labs and imaging studies  CBC: Recent Labs  Lab 08/08/21 0919 08/09/21 0610 08/10/21 0533  WBC 19.8* 9.0 9.6  NEUTROABS 17.7* 8.0* 8.7*  HGB 14.6 13.8 12.9*  HCT 42.6 39.8 37.3*  MCV 91.6 91.5 91.4  PLT 390 321 583   Basic Metabolic Panel: Recent Labs  Lab 08/08/21 0919 08/08/21 1335 08/09/21 0610 08/10/21 0533  NA 132* 126* 133* 133*  K 4.8 4.4 4.0 4.1  CL 96* 95* 99 99  CO2 _1 GLUCOSE 141* 272* 136* 98  BUN 35* 28* 25* 21  CREATININE 1.87* 1.44* 0.96 0.70  CALCIUM 9.1 8.0* 8.7* 8.1*  MG  --   --  2.5*  --   PHOS  --   --  3.8  --    GFR: Estimated Creatinine Clearance: 93.8 mL/min (by C-G formula based on SCr of 0.7 mg/dL).  Liver Function Tests: Recent Labs  Lab 08/08/21 0919  AST 48*  ALT 24  ALKPHOS 76  BILITOT 0.9  PROT 7.3  ALBUMIN 4.0   Recent Labs  Lab 08/08/21 0919  LIPASE 101*   No results for input(s): AMMONIA in the last 168 hours. Coagulation Profile: No results for input(s): INR, PROTIME in the last 168 hours. Cardiac Enzymes: Recent Labs  Lab 08/08/21 0919  CKTOTAL 1,739*   BNP (last 3 results) No results for input(s): PROBNP in the last 8760 hours. HbA1C: Recent Labs    08/09/21 0610  HGBA1C 6.0*   CBG: Recent Labs  Lab 08/10/21 1736 08/10/21 2021 08/11/21 0026 08/11/21 0421 08/11/21 0800  GLUCAP 118* 132* 98 94 132*   Lipid Profile: No results for input(s): CHOL, HDL, LDLCALC, TRIG, CHOLHDL, LDLDIRECT in the last 72 hours. Thyroid Function Tests: No results for input(s): TSH, T4TOTAL, FREET4, T3FREE, THYROIDAB in the last 72 hours. Anemia Panel: No results for input(s): VITAMINB12, FOLATE, FERRITIN, TIBC, IRON, RETICCTPCT in the last 72 hours. Sepsis Labs: Recent Labs  Lab 08/08/21 0919 08/08/21 1109  LATICACIDVEN 2.5* 1.7    Recent Results (from the past 240 hour(s))  Culture, blood (single)     Status: None  (Preliminary result)   Collection Time: 08/08/21 11:09 AM   Specimen: BLOOD  Result Value Ref Range Status   Specimen Description BLOOD RIGHT ANTECUBITAL  Final   Special Requests   Final    BOTTLES DRAWN AEROBIC AND ANAEROBIC Blood Culture adequate volume   Culture   Final    NO GROWTH 3 DAYS Performed at [facility], [address], [place], [place] [address]    Report Status PENDING  Incomplete  Resp Panel by RT-PCR (Flu A&B, Covid)     Status: None   Collection Time: 08/08/21 11:13 AM   Specimen: Nasopharyngeal(NP) swabs in vial transport medium  Result Value Ref Range Status   SARS Coronavirus 2 by RT PCR NEGATIVE NEGATIVE Final    Comment: (NOTE) SARS-CoV-2 target nucleic acids are NOT DETECTED.  The SARS-CoV-2 RNA is generally detectable in upper respiratory specimens during the acute phase of infection. The lowest concentration of SARS-CoV-2 viral copies this assay can detect is 138 copies/mL. A negative result does not preclude SARS-Cov-2 infection and should not be used as the sole basis for treatment or other patient management decisions. A negative result may occur with  improper specimen collection/handling, submission of specimen other than nasopharyngeal swab, presence of viral mutation(s) within the areas targeted by this assay, and inadequate number of viral copies(<138 copies/mL). A negative result must be combined with clinical observations, patient history, and epidemiological information. The expected result is Negative.  Fact Sheet for Patients:  EntrepreneurPulse.com.au  Fact Sheet for Healthcare Providers:  IncredibleEmployment.be  This test is no t yet approved or cleared by the [place] FDA and  has been authorized for detection and/or diagnosis of SARS-CoV-2 by FDA under an Emergency Use Authorization (EUA). This EUA will remain  in effect (meaning this test can be used) for the duration of  the COVID-19 declaration under Section 564(b)(1) of the Act, 21 U.S.C.section 360bbb-3(b)(1), unless the authorization is terminated  or revoked sooner.       Influenza A by PCR NEGATIVE NEGATIVE Final   Influenza B by PCR NEGATIVE NEGATIVE Final    Comment: (NOTE) The Xpert Xpress SARS-CoV-2/FLU/RSV plus assay is intended as an aid in the diagnosis of influenza from Nasopharyngeal swab specimens and should not be  used as a sole basis for treatment. Nasal washings and aspirates are unacceptable for Xpert Xpress SARS-CoV-2/FLU/RSV testing.  Fact Sheet for Patients: EntrepreneurPulse.com.au  Fact Sheet for Healthcare Providers: IncredibleEmployment.be  This test is not yet approved or cleared by the [place] FDA and has been authorized for detection and/or diagnosis of SARS-CoV-2 by FDA under an Emergency Use Authorization (EUA). This EUA will remain in effect (meaning this test can be used) for the duration of the COVID-19 declaration under Section 564(b)(1) of the Act, 21 U.S.C. section 360bbb-3(b)(1), unless the authorization is terminated or revoked.  Performed at [facility], [address] [place], [place] [address]   Culture, blood (routine x 2)     Status: None (Preliminary result)   Collection Time: 08/08/21 11:31 AM   Specimen: BLOOD  Result Value Ref Range Status   Specimen Description BLOOD  LAV  Final   Special Requests   Final    BOTTLES DRAWN AEROBIC AND ANAEROBIC Blood Culture adequate volume   Culture   Final    NO GROWTH 3 DAYS Performed at [facility], [address], [place], [place] [address]    Report Status PENDING  Incomplete         Radiology Studies: DG Wrist 2 Views Right  Result Date: 08/10/2021 CLINICAL DATA:  Hand and wrist soreness and swelling EXAM: RIGHT WRIST - 2 VIEW; RIGHT HAND - 2 VIEW COMPARISON:  None. FINDINGS: Wrist: There is no acute fracture or dislocation.  There is minimal positive ulnar variance. Alignment is otherwise normal. The joint spaces are preserved. The soft tissues are unremarkable. Hand: There is no acute fracture or dislocation. Alignment is normal. There is degenerative change at the thumb MCP joint. The joint spaces are otherwise preserved. The soft tissues are unremarkable. IMPRESSION: Mild degenerative change at the thumb MCP joint. Otherwise, unremarkable hand and wrist radiographs. Electronically Signed   By: Valetta Mole M.D.   On: 08/10/2021 13:33   MR BRAIN WO CONTRAST  Result Date: 08/11/2021 CLINICAL DATA:  Initial evaluation for neuro deficit, stroke suspected, cervical radiculopathy. EXAM: MRI HEAD WITHOUT CONTRAST TECHNIQUE: Multiplanar, multiecho pulse sequences of the brain and surrounding structures were obtained without intravenous contrast. COMPARISON:  Prior study from 08/08/2021. FINDINGS: MRI HEAD FINDINGS Brain: Cerebral volume within normal limits. No significant cerebral white matter disease for age. There are 2 subtle adjacent punctate foci of diffusion abnormality involving the subcortical aspect of the posterior right frontal region (series 5, images 36, 35), suspicious for tiny acute to early subacute ischemic infarcts. No associated hemorrhage or mass effect. No other diffusion abnormality to suggest acute or subacute ischemia. Gray-white matter differentiation otherwise maintained. No encephalomalacia to suggest chronic cortical infarction elsewhere within the brain. No other evidence for acute or chronic intracranial hemorrhage. No mass lesion, midline shift or mass effect. No hydrocephalus or extra-axial fluid collection. Pituitary gland and suprasellar region within normal limits. Midline structures intact. Vascular: There is asymmetric FLAIR signal with intermediate T1 signal intensity involving the right transverse and sigmoid sinus (series 15, image 14). While this finding is favored to reflect slow/sluggish flow,  possible thrombus is difficult to exclude. Major intracranial vascular flow voids are otherwise maintained. Skull and upper cervical spine: Craniocervical junction within normal limits. Bone marrow signal intensity normal. No focal marrow replacing lesion. No scalp soft tissue abnormality. Sinuses/Orbits: Globes and orbital soft tissues within normal limits. Mild scattered mucosal thickening noted within the ethmoidal air cells and maxillary sinuses. Paranasal sinuses are  otherwise clear. Small right mastoid effusion noted, of doubtful significance. Inner ear structures grossly normal. Other: None. IMPRESSION: 1. Two subtle adjacent punctate foci of diffusion abnormality involving the subcortical aspect of the posterior right frontal region, suspicious for tiny acute to early subacute ischemic infarcts. No associated hemorrhage or mass effect. 2. Asymmetric FLAIR signal intensity involving the right transverse and sigmoid sinus. While this finding is favored to reflect slow/sluggish flow, possible thrombus is difficult to exclude. Further assessment with dedicated MRV, with and without contrast, suggested for further evaluation. 3. Otherwise normal brain MRI for age. Electronically Signed   By: Jeannine Boga M.D.   On: 08/11/2021 00:53   MR CERVICAL SPINE WO CONTRAST  Result Date: 08/11/2021 CLINICAL DATA:  Initial evaluation for cervical radiculopathy. History of oropharyngeal squamous cell carcinoma. EXAM: MRI CERVICAL SPINE WITHOUT CONTRAST TECHNIQUE: Multiplanar, multisequence MR imaging of the cervical spine was performed. No intravenous contrast was administered. COMPARISON:  Prior CT of the neck from 01/05/2021. FINDINGS: Alignment: Straightening of the normal cervical lordosis. No listhesis. Vertebrae: Vertebral body height maintained without acute or chronic fracture. Suspected postradiation changes with possible fatty marrow conversion within the visualized osseous structures. Mild marrow edema  involving the anterior aspects of the C4 through C7 vertebral bodies could be post treatment related as well. No discrete or worrisome osseous lesions. Facet arthrosis with associated marrow edema present about the left C3-4 facet, which could contribute to neck pain. Cord: Normal signal and morphology. Posterior Fossa, vertebral arteries, paraspinal tissues: Craniocervical junction within normal limits. Irregular appearance of the visualized hypopharynx with layering retropharyngeal effusion and right paraesophageal nodal conglomerate, grossly similar to prior neck CT. Normal flow voids seen within the vertebral arteries bilaterally. Disc levels: C2-C3: Mild left eccentric disc bulge with left-sided uncinate spurring. Mild left-sided facet hypertrophy. No spinal stenosis. Foramina remain patent. C3-C4: Broad-based posterior disc osteophyte complex flattens and partially effaces the ventral thecal sac, asymmetric to the left. Mild left-sided spinal stenosis without cord impingement. Superimposed moderate left-sided facet degeneration. Moderate left with mild right C4 foraminal narrowing. C4-C5: Mild disc bulge with uncovertebral spurring. Mild left greater than right facet hypertrophy. No spinal stenosis. Mild bilateral C5 foraminal narrowing. C5-C6: Broad-based disc osteophyte complex flattens and partially effaces the ventral thecal sac, asymmetric to the right. Mild spinal stenosis with mild flattening of the ventral cord, but no cord signal changes. Severe right worse than left C6 foraminal narrowing. C6-C7: Minimal disc bulge with bilateral uncovertebral spurring. No spinal stenosis. Superimposed mild left-sided facet hypertrophy. Mild bilateral C7 foraminal narrowing. C7-T1: Negative interspace. Mild left-sided facet hypertrophy. No stenosis. Visualized upper thoracic spine demonstrates no significant finding. IMPRESSION: 1. Degenerative disc osteophyte at C5-6 with resultant mild spinal stenosis, with severe  bilateral C6 foraminal narrowing. 2. Left eccentric disc osteophyte and facet arthrosis at C3-4 with resultant mild left-sided spinal stenosis, with moderate left C4 foraminal narrowing. 3. More mild multilevel spondylosis elsewhere within the cervical spine as above. No other significant stenosis or neural impingement. 4. Post treatment changes within the visualized neck with associated right paraesophageal nodal conglomerate, partially visualized, but grossly similar to previous neck CT from 01/05/2021. Electronically Signed   By: Jeannine Boga M.D.   On: 08/11/2021 01:10   DG Hand 2 View Right  Result Date: 08/10/2021 CLINICAL DATA:  Hand and wrist soreness and swelling EXAM: RIGHT WRIST - 2 VIEW; RIGHT HAND - 2 VIEW COMPARISON:  None. FINDINGS: Wrist: There is no acute fracture or dislocation. There is minimal positive  ulnar variance. Alignment is otherwise normal. The joint spaces are preserved. The soft tissues are unremarkable. Hand: There is no acute fracture or dislocation. Alignment is normal. There is degenerative change at the thumb MCP joint. The joint spaces are otherwise preserved. The soft tissues are unremarkable. IMPRESSION: Mild degenerative change at the thumb MCP joint. Otherwise, unremarkable hand and wrist radiographs. Electronically Signed   By: Valetta Mole M.D.   On: 08/10/2021 13:33        Scheduled Meds:  Chlorhexidine Gluconate Cloth  6 each Topical Daily   enoxaparin (LOVENOX) injection  40 mg Subcutaneous Q24H   feeding supplement (KATE FARMS STANDARD 1.4)  325 mL Per Tube 5 X Daily   free water  120 mL Per Tube 5 X Daily   insulin aspart  0-9 Units Subcutaneous Q4H   levothyroxine  50 mcg Per Tube Q0600   pantoprazole sodium  40 mg Per Tube Daily   polyethylene glycol  17 g Per Tube Daily   Continuous Infusions:  cefTRIAXone (ROCEPHIN)  IV 2 g (08/10/21 1521)   lactated ringers     metronidazole 500 mg (08/11/21 0115)     LOS: 3 days    Time spent:25  mins. More than 50% of that time was spent in counseling and/or coordination of care.      Shelly Coss, MD [facility] P9/07/2021, 8:01 AM

## 2021-08-12 ENCOUNTER — Inpatient Hospital Stay: Payer: Medicare Other

## 2021-08-12 ENCOUNTER — Inpatient Hospital Stay: Payer: Medicare Other | Admitting: Oncology

## 2021-08-12 ENCOUNTER — Encounter: Payer: Self-pay | Admitting: Internal Medicine

## 2021-08-12 ENCOUNTER — Inpatient Hospital Stay
Admit: 2021-08-12 | Discharge: 2021-08-12 | Disposition: A | Discharge status: Home / Self Care | Payer: Medicare Other | Attending: Internal Medicine | Admitting: Internal Medicine

## 2021-08-12 DIAGNOSIS — J90 Pleural effusion, not elsewhere classified: Secondary | ICD-10-CM | POA: Diagnosis not present

## 2021-08-12 DIAGNOSIS — J9601 Acute respiratory failure with hypoxia: Secondary | ICD-10-CM | POA: Diagnosis not present

## 2021-08-12 DIAGNOSIS — R29898 Other symptoms and signs involving the musculoskeletal system: Secondary | ICD-10-CM

## 2021-08-12 DIAGNOSIS — R0902 Hypoxemia: Secondary | ICD-10-CM | POA: Diagnosis not present

## 2021-08-12 DIAGNOSIS — J690 Pneumonitis due to inhalation of food and vomit: Secondary | ICD-10-CM | POA: Diagnosis not present

## 2021-08-12 LAB — BASIC METABOLIC PANEL
Anion gap: 10 (ref 5–15)
BUN: 16 mg/dL (ref 8–23)
CO2: 28 mmol/L (ref 22–32)
Calcium: 9 mg/dL (ref 8.9–10.3)
Chloride: 90 mmol/L — ABNORMAL LOW (ref 98–111)
Creatinine, Ser: 0.71 mg/dL (ref 0.61–1.24)
GFR, Estimated: >60 mL/min (ref >60)
Glucose, Bld: 108 mg/dL — ABNORMAL HIGH (ref 70–99)
Potassium: 4.5 mmol/L (ref 3.5–5.1)
Sodium: 128 mmol/L — ABNORMAL LOW (ref 135–145)

## 2021-08-12 LAB — LIPID PANEL
Cholesterol: 168 mg/dL (ref 0–200)
HDL: 68 mg/dL (ref >40)
LDL Cholesterol: 85 mg/dL (ref 0–99)
Total CHOL/HDL Ratio: 2.5 RATIO
Triglycerides: 75 mg/dL (ref <150)
VLDL: 15 mg/dL (ref 0–40)

## 2021-08-12 LAB — CBC WITH DIFFERENTIAL/PLATELET
Abs Immature Granulocytes: 0.07 10*3/uL (ref 0.00–0.07)
Basophils Absolute: 0.1 10*3/uL (ref 0.0–0.1)
Basophils Relative: 1 %
Eosinophils Absolute: 0.1 10*3/uL (ref 0.0–0.5)
Eosinophils Relative: 1 %
HCT: 41.4 % (ref 39.0–52.0)
Hemoglobin: 14.4 g/dL (ref 13.0–17.0)
Immature Granulocytes: 1 %
Lymphocytes Relative: 3 %
Lymphs Abs: 0.3 10*3/uL — ABNORMAL LOW (ref 0.7–4.0)
MCH: 31.4 pg (ref 26.0–34.0)
MCHC: 34.8 g/dL (ref 30.0–36.0)
MCV: 90.2 fL (ref 80.0–100.0)
Monocytes Absolute: 0.6 10*3/uL (ref 0.1–1.0)
Monocytes Relative: 6 %
Neutro Abs: 9.1 10*3/uL — ABNORMAL HIGH (ref 1.7–7.7)
Neutrophils Relative %: 88 %
Platelets: 335 10*3/uL (ref 150–400)
RBC: 4.59 MIL/uL (ref 4.22–5.81)
RDW: 14 % (ref 11.5–15.5)
WBC: 10.2 10*3/uL (ref 4.0–10.5)
nRBC: 0 % (ref 0.0–0.2)

## 2021-08-12 LAB — ECHOCARDIOGRAM COMPLETE
AR max vel: 3.35 cm2
AV Area VTI: 3.38 cm2
AV Area mean vel: 3.45 cm2
AV Mean grad: 2 mmHg
AV Peak grad: 3.7 mmHg
Ao pk vel: 0.96 m/s
Area-P 1/2: 9.37 cm2
Height: 70 in
MV VTI: 3.83 cm2
S' Lateral: 3.5 cm
Weight: 3026.47 oz

## 2021-08-12 LAB — GLUCOSE, CAPILLARY
Glucose-Capillary: 107 mg/dL — ABNORMAL HIGH (ref 70–99)
Glucose-Capillary: 108 mg/dL — ABNORMAL HIGH (ref 70–99)
Glucose-Capillary: 134 mg/dL — ABNORMAL HIGH (ref 70–99)
Glucose-Capillary: 135 mg/dL — ABNORMAL HIGH (ref 70–99)
Glucose-Capillary: 136 mg/dL — ABNORMAL HIGH (ref 70–99)
Glucose-Capillary: 93 mg/dL (ref 70–99)
Glucose-Capillary: 94 mg/dL (ref 70–99)

## 2021-08-12 IMAGING — MR MR CERVICAL SPINE W/ CM
3 series · 23 of 48 positions shown · IV contrast (agent unspecified)
Comparison: [DATE] MRI cervical spine without contrast,
[DATE] MRI cervical spine with and without contrast. Correlation
is also made with CTA head neck [DATE]

CLINICAL DATA: Head and neck cancer, known C-spine involvement

EXAM:
MRI CERVICAL SPINE WITH CONTRAST
TECHNIQUE: Multiplanar, multisequence MR imaging of the cervical spine was
performed following the administration of intravenous contrast.

[Series 1: T1 · axial · non-contrast · 3.0mm · 0.35mm/px · z∈[-62,+15]mm · 9 of 29 slices shown]
[im 2/29]
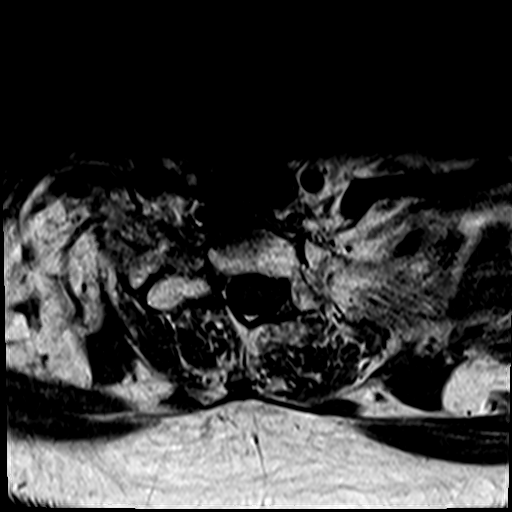
[im 5/29]
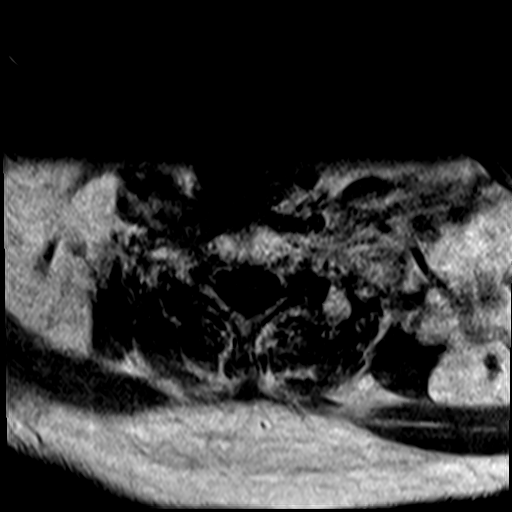
[im 8/29]
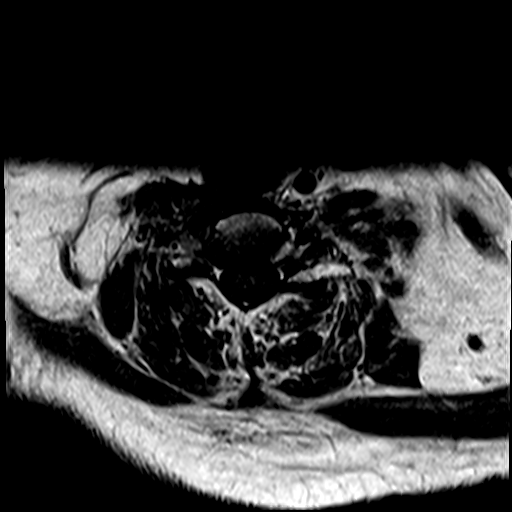
[im 13/29]
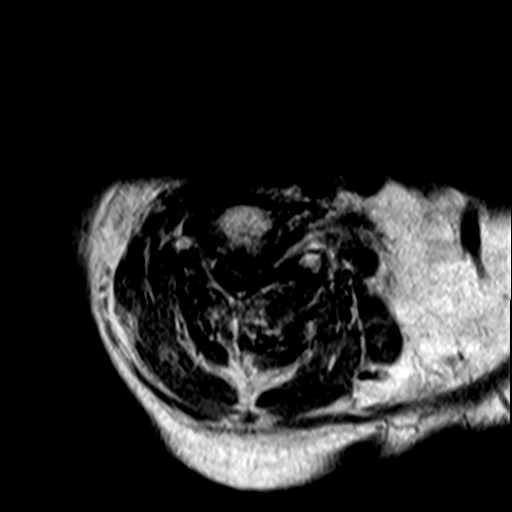
[im 15/29]
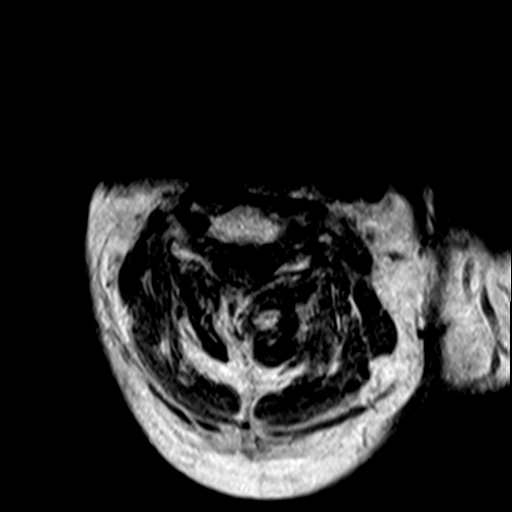
[im 16/29]
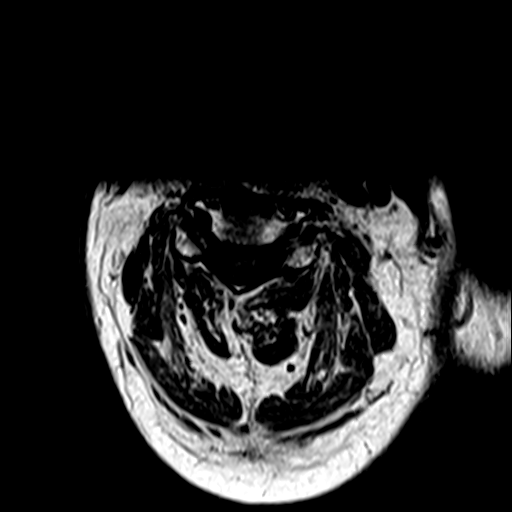
[im 21/29]
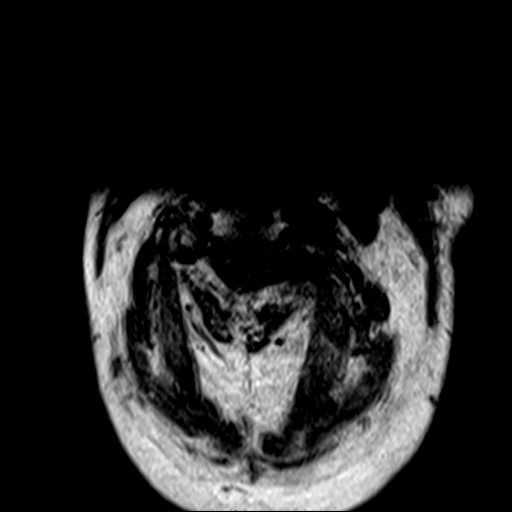
[im 24/29]
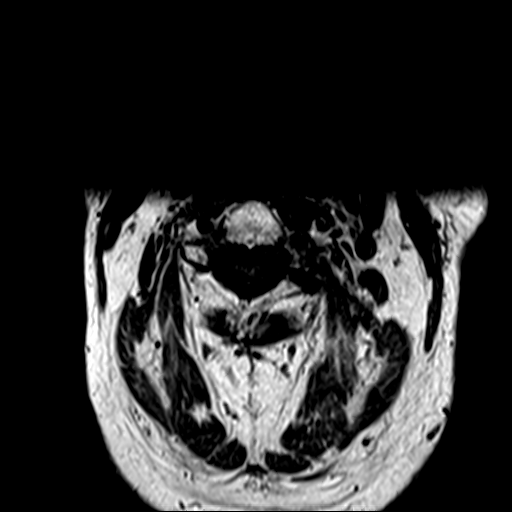
[im 27/29]
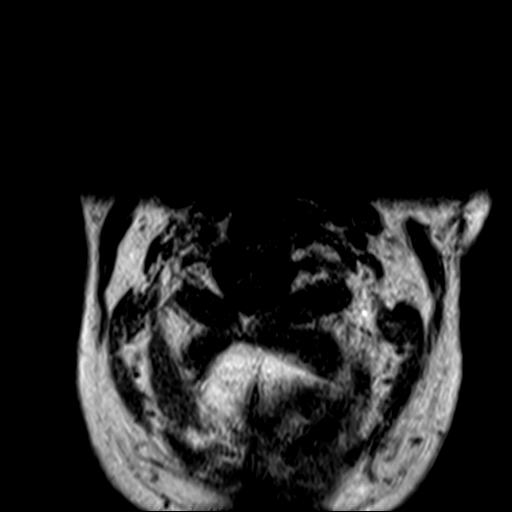

[Series 2: FLAIR fat-sat post-contrast · sagittal · 3.0mm · 0.78mm/px · 5 of 15 slices shown]
[im 1/15]
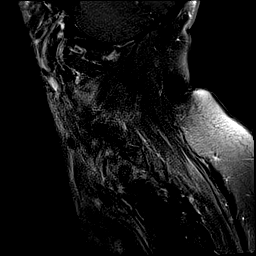
[im 2/15]
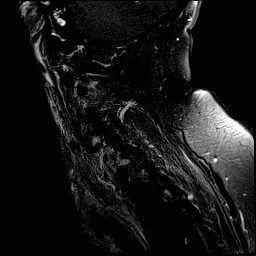
[im 5/15]
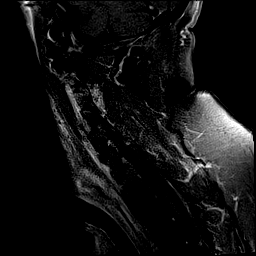
[im 8/15]
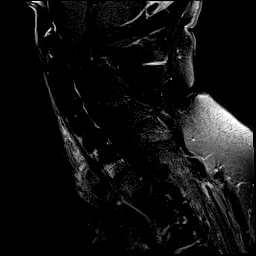
[im 13/15]
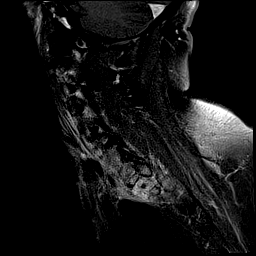

[Series 3: T1 post-contrast · axial · 3.0mm · 0.35mm/px · z∈[-62,+15]mm · 9 of 29 slices shown]
[im 2/29]
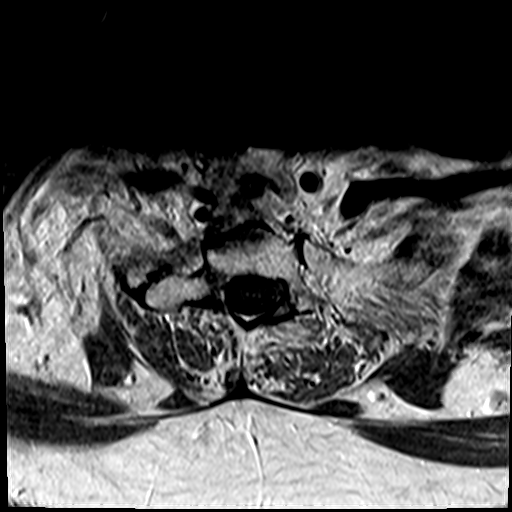
[im 5/29]
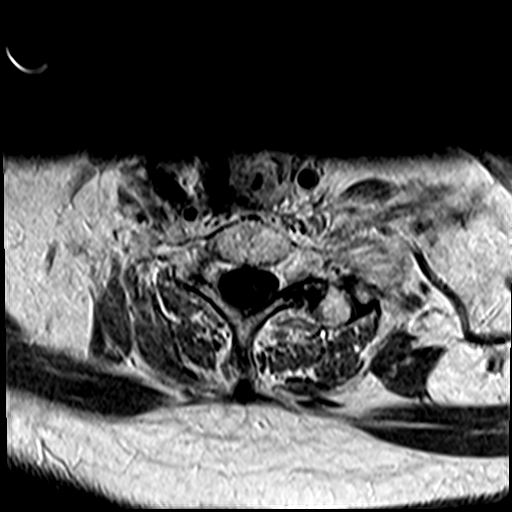
[im 8/29]
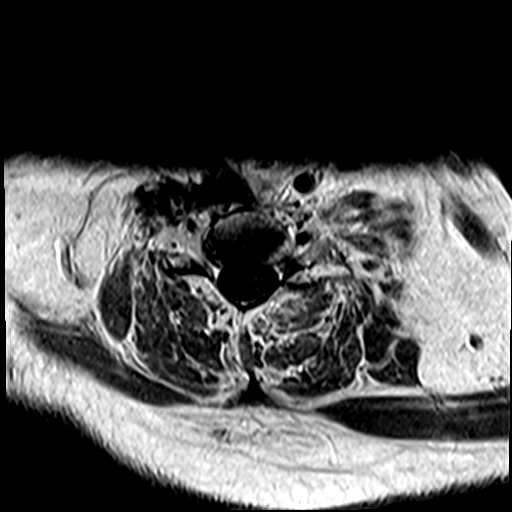
[im 13/29]
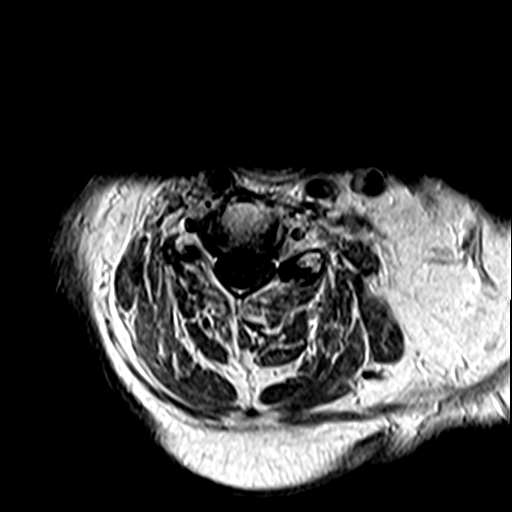
[im 15/29]
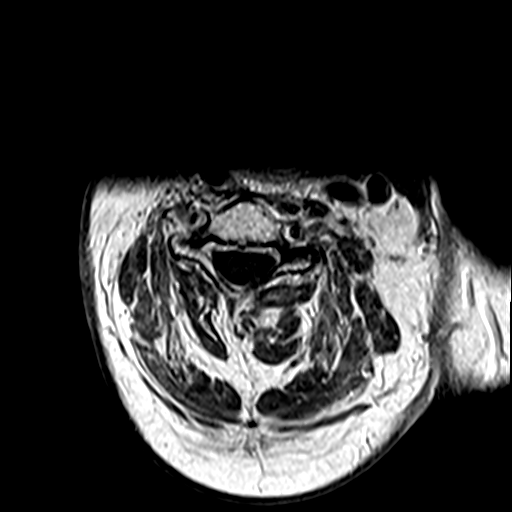
[im 16/29]
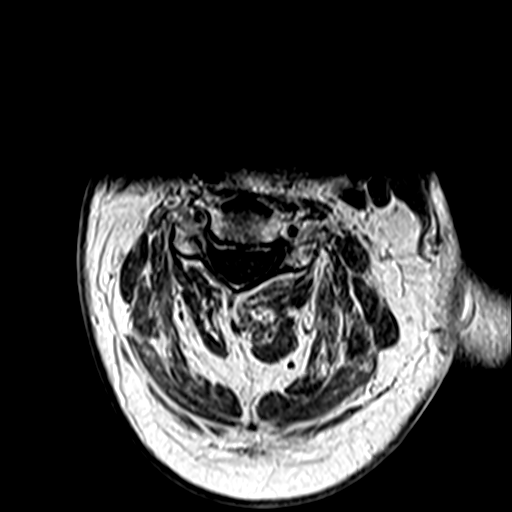
[im 21/29]
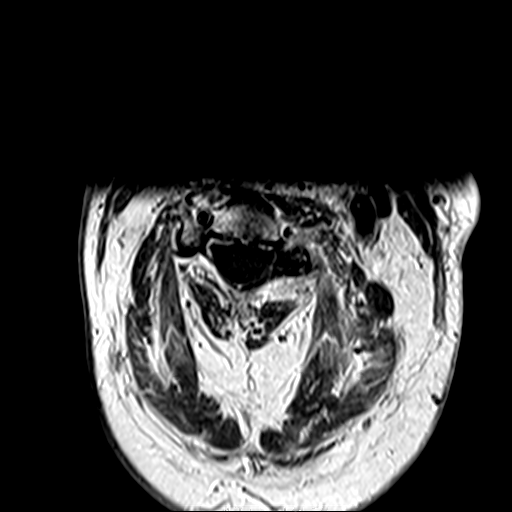
[im 24/29]
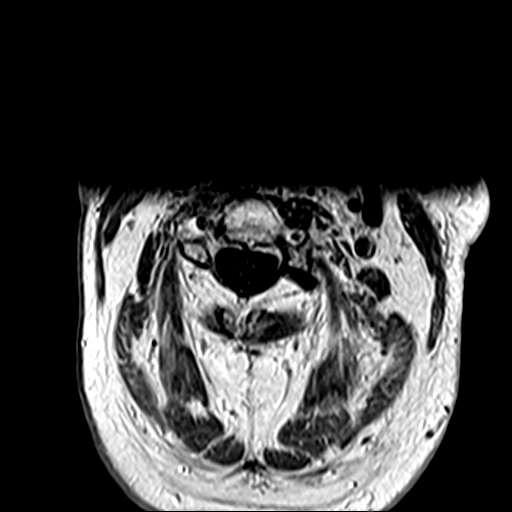
[im 27/29]
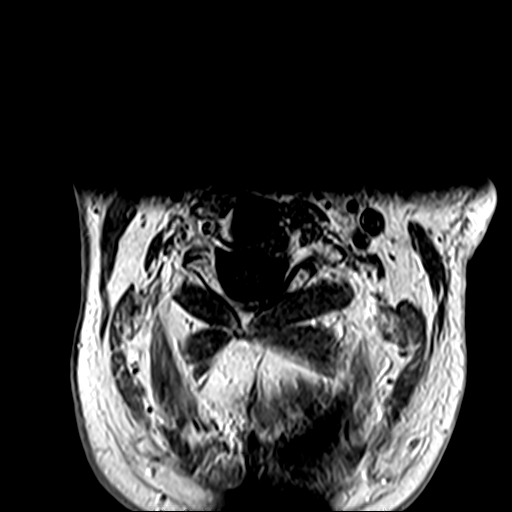

[23 of 48 positions shown; findings below may reference images not displayed]

FINDINGS: Alignment: Physiologic.

Vertebrae: Diffuse fatty replacement of the bone marrow secondary to
prior radiation in the neck. Redemonstrated small areas of
enhancement at the anterior aspect of C5, C6, C7, and T1, which
appear overall similar to the prior exam.

No acute fracture.

Cord: No abnormal enhancement.

Posterior Fossa, vertebral arteries, paraspinal tissues: Mucosal
thickening and enhancement in the esophagus, with a nonenhancing
fluid collection at the right aspect. Posterior fossa is
unremarkable.

Disc levels:

Please see previous MRI of the cervical spine [DATE].
IMPRESSION: Redemonstrated small areas of enhancement at the anterior aspects of
C5, C6, C7, and T1, which appear overall similar to the prior exam.
No new foci of abnormal osseous enhancement. No evidence of cord
enhancement.

## 2021-08-12 IMAGING — MR MR CHEST MEDIASTINUM WO/W CM
9 series · 16 of 16 positions shown · IV contrast (gadavist)
Comparison: CTA head and neck from yesterday.

CLINICAL DATA: Progressive right upper extremity weakness. History
of metastatic squamous cell carcinoma of the oropharynx.

EXAM:
MR CHEST WITH AND WITHOUT CONTRAST
TECHNIQUE: Multiplanar, multisequence MR imaging of the right brachial plexus
was performed before and after the administration of intravenous
contrast.
CONTRAST:  7.5mL GADAVIST GADOBUTROL 1 MMOL/ML IV SOLN

[Series 5: T2 fat-sat · axial · 3.0mm · 1.33mm/px · 1 of 42 slices shown (1 of 2)]
[im 1/42]
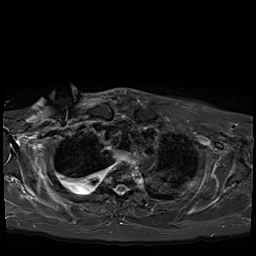

[Series 7: T1 · axial · non-contrast · 3.0mm · 1.06mm/px · 1 of 40 slices shown (1 of 3)]
[im 1/40]
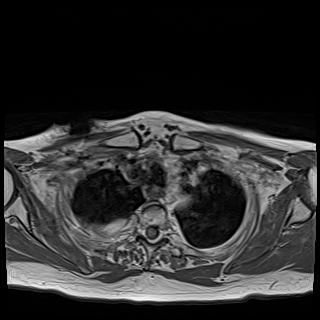

[Series 9: T1 · axial · non-contrast · 3.0mm · 1.06mm/px · z∈[-58,+98]mm · 2 of 40 slices shown (2 of 3)]
[im 1/40]
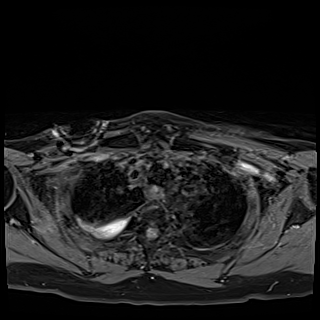
[im 40/40]
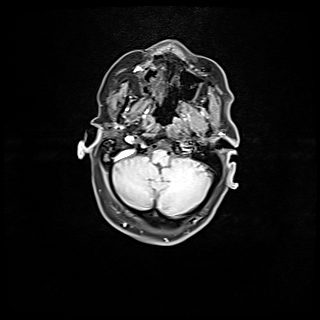

[Series 10: STIR · coronal · 4.0mm · 0.94mm/px · 2 of 48 slices shown]
[im 1/48]
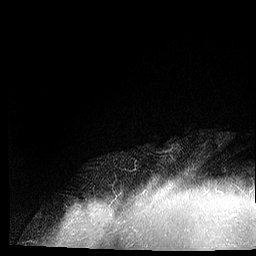
[im 48/48]
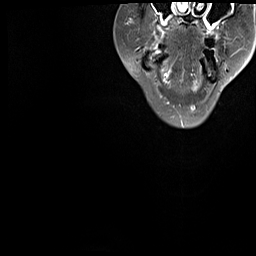

[Series 11: T1 · sagittal · 4.0mm · 0.75mm/px · 2 of 48 slices shown (3 of 3)]
[im 1/48]
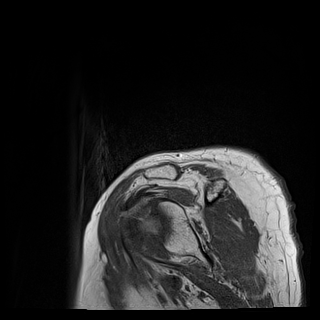
[im 48/48]
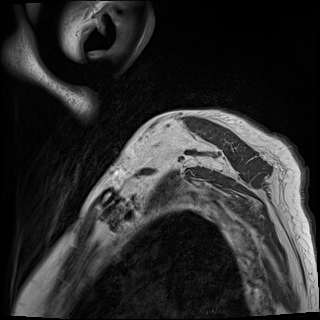

[Series 12: T2 fat-sat · sagittal · 4.0mm · 0.75mm/px · 2 of 47 slices shown (2 of 2)]
[im 1/47]
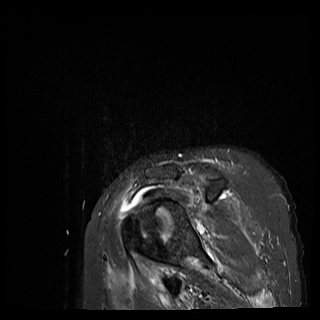
[im 47/47]
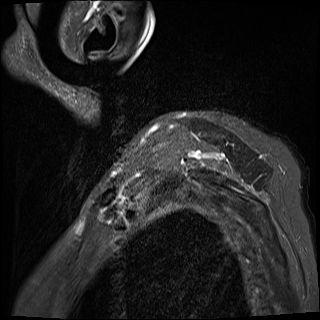

[Series 14: T1 fat-sat post-contrast · axial · 3.0mm · 1.06mm/px · z∈[-58,+98]mm · 2 of 40 slices shown (1 of 2)]
[im 1/40]
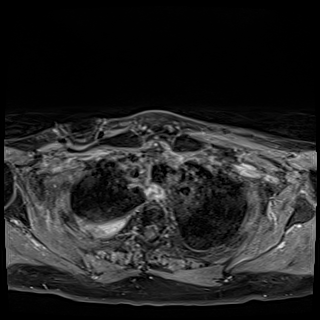
[im 40/40]
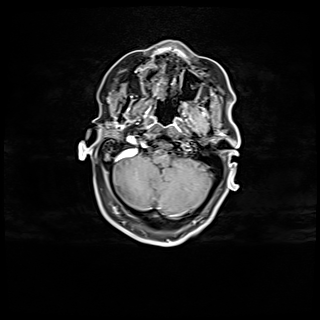

[Series 16: T1 fat-sat post-contrast · sagittal · 4.0mm · 0.75mm/px · 2 of 48 slices shown (2 of 2)]
[im 1/48]
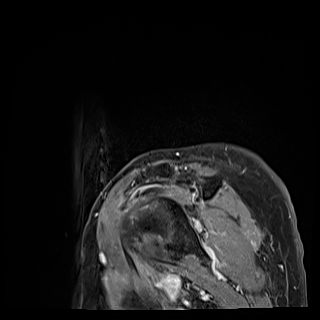
[im 48/48]
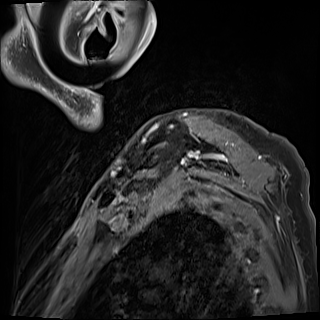

[Series 18: (id) fs post-uni_w · coronal · 4.0mm · 0.94mm/px · 2 of 48 slices shown]
[im 1/48]
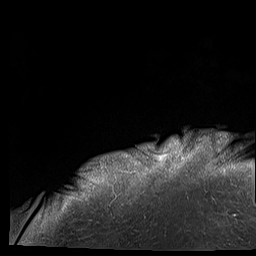
[im 48/48]
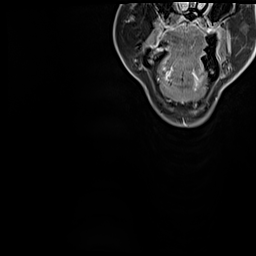

[16 of 16 positions shown; findings below may reference images not displayed]

FINDINGS: Spinal cord

Normal caliber and signal of visualized spinal cord.

Brachial plexus

There is diffusely increased T2 signal and mild enhancement
involving the right brachial plexus roots, trunks, divisions, cords,
and branches.

Muscles and tendons

Incompletely evaluated mild edema involving the right rotator cuff
muscles.

Bones

Please see separate MRI cervical spine reports from today and
yesterday.

Other findings

Unchanged moderate right pleural effusion. Similar right
paraesophageal nodal conglomerate at the thoracic inlet. Edematous
glottic and supraglottic larynx consistent with post radiation
change. Similar layering secretions in the hypopharynx.
Non-enhancing soft tissue thickening surrounding the right common
carotid artery, also consistent with post treatment change.
IMPRESSION: 1. Right brachial plexitis, presumably post radiation in etiology.
2. Similar right paraesophageal nodal conglomerate at the thoracic
inlet. Similar post treatment changes in the larynx and surrounding
the right common carotid artery.
3. Unchanged moderate right pleural effusion.

## 2021-08-12 MED ORDER — GADOBUTROL 1 MMOL/ML IV SOLN
7.5000 mL | Freq: Once | INTRAVENOUS | Status: AC | PRN
  Administered 2021-08-12: 14:00:00 7.5 mL via INTRAVENOUS

## 2021-08-12 MED ORDER — TRAMADOL HCL 50 MG PO TABS
50.0000 mg | ORAL_TABLET | Freq: Four times a day (QID) | ORAL | Status: DC | PRN
Start: 2021-08-12 — End: 2021-08-14
  Administered 2021-08-13 – 2021-08-14 (×4): 50 mg
  Filled 2021-08-12 (×5): qty 1

## 2021-08-12 MED ORDER — ATORVASTATIN CALCIUM 20 MG PO TABS
40.0000 mg | ORAL_TABLET | Freq: Every day | ORAL | Status: DC
  Administered 2021-08-12 – 2021-08-13 (×2): 40 mg
  Filled 2021-08-12 (×2): qty 2

## 2021-08-12 NOTE — Progress Notes (Signed)
*  PRELIMINARY RESULTS* Echocardiogram 2D Echocardiogram has been performed.  [place] 08/12/2021, 3:29 PM

## 2021-08-12 NOTE — TOC Progression Note (Signed)
Transition of Care (TOC) - Progression Note    Patient Details  Name: Ivan Garcia. MRN: [id number] Date of Birth: [birth year]  Transition of Care [facility]) CM/SW Contact  Shelbie Hutching, RN Phone Number: 08/12/2021, 12:38 PM  Clinical Narrative:    [facility] does not have any beds available but [facility] has offered.  Patient accepts [facility], he is not medically ready for discharge today.     Expected Discharge Plan: [place] Barriers to Discharge: Continued Medical Work up  Expected Discharge Plan and Services Expected Discharge Plan: [place]   Discharge Planning Services: CM Consult Post Acute Care Choice: [place] Living arrangements for the past 2 months: Single Family Home                 DME Arranged: N/A DME Agency: NA       HH Arranged: NA HH Agency: NA         Social Determinants of Health (SDOH) Interventions    Readmission Risk Interventions No flowsheet data found.

## 2021-08-12 NOTE — Progress Notes (Addendum)
PROGRESS NOTE    Ivan Garcia.  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  On presentation he was found to be hypoxemic.  Patient has severe dysphagia and has been strictly n.p.o. and gets nutrition from the tube feed.  On presentation, he was tachycardic, blood pressure was stable, he required 5 L of oxygen for maintenance of saturation.  Lab work showed hyponatremia, elevated glucose, creatinine of 1.44.  COVID screen test negative.  Chest CT scan showed multilobar aspiration pneumonia.  Started on  antibiotics.  Orthopedics consulted for severe carpal tunnel syndrome of the right hand.  He was found to have new onset weakness of right upper extremity, MRI of the brain showed acute/subacute ischemic infarct on the right frontal lobe.  Neurology following. MRI cervical spine with contrast, MRI brachial plexus study pending.  Oncology, neurosurgery also consulted.  Assessment & Plan:   Active Problems:   Aspiration pneumonia (HCC)   Acute hypoxemic respiratory failure (HCC)   Sepsis (HCC)   Acute hypoxic respiratory failure secondary to multilobar aspiration pneumonia/severe sepsis: Met septic criteria with tachycardia, leukocytosis, lactic acidosis, AKI.  Required 5 L of oxygen for maintenance of saturation on admission. Started on broad-spectrum antibiotics with ceftriaxone and Flagyl .  He is currently on room air.  Right upper extremity weakness/acute ischemic stroke: He was found to have new onset weakness of right upper extremity .MRI of the brain showed acute/subacute ischemic infarct on the right frontal lobe.  MRI of the cervical spine showed severe  bilateral foraminal narrowing, degenerative changes, no significant spinal stenosis.Neurology consulted .  Started on aspirin 81 mg daily, Lipitor. Hemoglobin A1c of 6.  LDL of 85 CT angio head and neck was negative for large vessel occlusion,showed 6 mm cavernous left ICA aneurysm.  CT venogram did not show dural venous sinus thrombosis.  Neurology recommended MRI cervical spine with contrast, MRI right brachial plexus protocol.  These are pending. There is a chance that his right upper extremity weakness could be associated with progression of his oropharyngeal malignancy.  Oncology, neurosurgery consulted today.  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  Strictly n.p.o. at home. Follows with oncology as an outpatient.On lenvatinib and also on immunotherapy  AKI/hyponatremia: AKI has resolved, sodium at 128.  Continue to monitor  Fall: Has problem with ambulation.  Had bruise on the forehead after the fall.  CT scan did not show any intracranial hemorrhage. PT/OT recommended SNF  History of hypertension: On amlodipine at home.  Currently blood pressure stable  Hyperlipidemia: On Lipitor at home.  Hypothyroidism: On Synthyroid at home.  Diabetes type 2: Hyperglycemic in presentation.  Started on sliding scale insulin.  Monitor blood sugars.HbA1c of 6.0  Right hand carpal tunnel syndrome/chronic pain syndrome: Follows with orthopedics.  Recently had a steroid shot for carpal tunnel Syndrome.  Impaired mobility on the right hand with pain and swelling,now much better.  Continue pain medicines/supportive care.ortho consulted here.  Debility/deconditioning: Has profound right upper extremity weakness.  PT/OT recommending skilled nursing facility on discharge    Nutrition Problem: Increased nutrient needs Etiology: cancer and cancer related treatments      DVT prophylaxis:Lovenox  Code Status: Full Family Communication:  Discussed with the sister face-to-face on 08/12/2021 Status is: Inpatient  Remains inpatient appropriate because:Inpatient level of care appropriate due to severity of illness  Dispo: The patient is from: Home              Anticipated d/c is to: skilled nursing facility              Patient currently is not medically stable to d/c.   Difficult to place patient No     Consultants: None  Procedures:None  Antimicrobials:  Anti-infectives (From admission, onward)    Start     Dose/Rate Route Frequency Ordered Stop   08/08/21 1615  cefTRIAXone (ROCEPHIN) 2 g in sodium chloride 0.9 % 100 mL IVPB        2 g 200 mL/hr over 30 Minutes Intravenous Every 24 hours 08/08/21 1610 08/13/21 1614   08/08/21 1615  metroNIDAZOLE (FLAGYL) IVPB 500 mg        500 mg 100 mL/hr over 60 Minutes Intravenous Every 8 hours 08/08/21 1610     08/08/21 1045  ceFEPIme (MAXIPIME) 2 g in sodium chloride 0.9 % 100 mL IVPB        2 g 200 mL/hr over 30 Minutes Intravenous  Once 08/08/21 1039 08/08/21 1122   08/08/21 1045  metroNIDAZOLE (FLAGYL) IVPB 500 mg        500 mg 100 mL/hr over 60 Minutes Intravenous  Once 08/08/21 1039 08/08/21 1218   08/08/21 1045  vancomycin (VANCOCIN) IVPB 1000 mg/200 mL premix        1,000 mg 200 mL/hr over 60 Minutes Intravenous  Once 08/08/21 1039 08/08/21 1333       Subjective:  Patient seen and examined at the bedside this morning.  Hemodynamically stable.  Very frustrated due to his condition.  He is not able to lift his right upper extremity.  Wants to get out of the bed but not able to due to his requirement for assistance.  I had a long discussion with the sister at the bedside.  Objective: Vitals:   08/11/21 1211 08/11/21 1552 08/11/21 2007 08/12/21 0504  BP: (!) 134/92 (!) 144/101 (!) 133/91 (!) 148/100  Pulse: 93 (!) 101 (!) 104 (!) 104  Resp: (!) '22 20  18  ' Temp: 97.7 F (36.5 C) 97.6 F (36.4 C) 97.6 F (36.4 C) (!) 97.5 F (36.4 C)  TempSrc:  Oral    SpO2:  94% 98% 94% 92%  Weight:      Height:        Intake/Output Summary (Last 24 hours) at 08/12/2021 0815 Last data filed at 08/12/2021 3361 Gross per 24 hour  Intake --  Output 4603 ml  Net -4603 ml   Filed Weights   08/08/21 1728  Weight: 85.8 kg    Examination:  General exam: Overall comfortable, not in distress HEENT: PERRL Respiratory system:  no wheezes or crackles  Cardiovascular system: S1 & S2 heard, RRR.  Gastrointestinal system: Abdomen is nondistended, soft and nontender. Central nervous system: Alert and oriented, severe weakness of right upper extremity with motor of 1/5 Extremities: No edema, no clubbing ,no cyanosis Skin: No rashes, no ulcers,no icterus      Data Reviewed: I have personally reviewed following labs and imaging studies  CBC: Recent Labs  Lab 08/08/21 0919 08/09/21 0610 08/10/21 0533 08/12/21 0642  WBC 19.8* 9.0 9.6 10.2  NEUTROABS 17.7* 8.0* 8.7* 9.1*  HGB 14.6 13.8 12.9* 14.4  HCT 42.6 39.8  37.3* 41.4  MCV 91.6 91.5 91.4 90.2  PLT 390 321 306 633   Basic Metabolic Panel: Recent Labs  Lab 08/08/21 0919 08/08/21 1335 08/09/21 0610 08/10/21 0533 08/12/21 0642  NA 132* 126* 133* 133* 128*  K 4.8 4.4 4.0 4.1 4.5  CL 96* 95* 99 99 90*  CO2 '28 24 29 27 28  ' GLUCOSE 141* 272* 136* 98 108*  BUN 35* 28* 25* 21 16  CREATININE 1.87* 1.44* 0.96 0.70 0.71  CALCIUM 9.1 8.0* 8.7* 8.1* 9.0  MG  --   --  2.5*  --   --   PHOS  --   --  3.8  --   --    GFR: Estimated Creatinine Clearance: 93.8 mL/min (by C-G formula based on SCr of 0.71 mg/dL). Liver Function Tests: Recent Labs  Lab 08/08/21 0919  AST 48*  ALT 24  ALKPHOS 76  BILITOT 0.9  PROT 7.3  ALBUMIN 4.0   Recent Labs  Lab 08/08/21 0919  LIPASE 101*   No results for input(s): AMMONIA in the last 168 hours. Coagulation Profile: No results for input(s): INR, PROTIME in the last 168 hours. Cardiac Enzymes: Recent Labs  Lab 08/08/21 0919  CKTOTAL 1,739*   BNP (last 3  results) No results for input(s): PROBNP in the last 8760 hours. HbA1C: No results for input(s): HGBA1C in the last 72 hours.  CBG: Recent Labs  Lab 08/11/21 1158 08/11/21 1552 08/11/21 2104 08/12/21 0037 08/12/21 0456  GLUCAP 115* 108* 112* 108* 94   Lipid Profile: Recent Labs    08/12/21 0642  CHOL 168  HDL 68  LDLCALC 85  TRIG 75  CHOLHDL 2.5   Thyroid Function Tests: No results for input(s): TSH, T4TOTAL, FREET4, T3FREE, THYROIDAB in the last 72 hours. Anemia Panel: No results for input(s): VITAMINB12, FOLATE, FERRITIN, TIBC, IRON, RETICCTPCT in the last 72 hours. Sepsis Labs: Recent Labs  Lab 08/08/21 0919 08/08/21 1109  LATICACIDVEN 2.5* 1.7    Recent Results (from the past 240 hour(s))  Culture, blood (single)     Status: None (Preliminary result)   Collection Time: 08/08/21 11:09 AM   Specimen: BLOOD  Result Value Ref Range Status   Specimen Description BLOOD RIGHT ANTECUBITAL  Final   Special Requests   Final    BOTTLES DRAWN AEROBIC AND ANAEROBIC Blood Culture adequate volume   Culture   Final    NO GROWTH 4 DAYS Performed at [facility], [address], [place], [place] [address]    Report Status PENDING  Incomplete  Resp Panel by RT-PCR (Flu A&B, Covid)     Status: None   Collection Time: 08/08/21 11:13 AM   Specimen: Nasopharyngeal(NP) swabs in vial transport medium  Result Value Ref Range Status   SARS Coronavirus 2 by RT PCR NEGATIVE NEGATIVE Final    Comment: (NOTE) SARS-CoV-2 target nucleic acids are NOT DETECTED.  The SARS-CoV-2 RNA is generally detectable in upper respiratory specimens during the acute phase of infection. The lowest concentration of SARS-CoV-2 viral copies this assay can detect is 138 copies/mL. A negative result does not preclude SARS-Cov-2 infection and should not be used as the sole basis for treatment or other patient management decisions. A negative result may occur with  improper specimen  collection/handling, submission of specimen other than nasopharyngeal swab, presence of viral mutation(s) within the areas targeted by this assay, and inadequate number of viral copies(<138 copies/mL). A negative result must be combined with clinical observations, patient history, and epidemiological  information. The expected result is Negative.  Fact Sheet for Patients:  EntrepreneurPulse.com.au  Fact Sheet for Healthcare Providers:  IncredibleEmployment.be  This test is no t yet approved or cleared by the [place] FDA and  has been authorized for detection and/or diagnosis of SARS-CoV-2 by FDA under an Emergency Use Authorization (EUA). This EUA will remain  in effect (meaning this test can be used) for the duration of the COVID-19 declaration under Section 564(b)(1) of the Act, 21 U.S.C.section 360bbb-3(b)(1), unless the authorization is terminated  or revoked sooner.       Influenza A by PCR NEGATIVE NEGATIVE Final   Influenza B by PCR NEGATIVE NEGATIVE Final    Comment: (NOTE) The Xpert Xpress SARS-CoV-2/FLU/RSV plus assay is intended as an aid in the diagnosis of influenza from Nasopharyngeal swab specimens and should not be used as a sole basis for treatment. Nasal washings and aspirates are unacceptable for Xpert Xpress SARS-CoV-2/FLU/RSV testing.  Fact Sheet for Patients: EntrepreneurPulse.com.au  Fact Sheet for Healthcare Providers: IncredibleEmployment.be  This test is not yet approved or cleared by the [place] FDA and has been authorized for detection and/or diagnosis of SARS-CoV-2 by FDA under an Emergency Use Authorization (EUA). This EUA will remain in effect (meaning this test can be used) for the duration of the COVID-19 declaration under Section 564(b)(1) of the Act, 21 U.S.C. section 360bbb-3(b)(1), unless the authorization is terminated or revoked.  Performed at [facility], [address] [place], [place] [address]   Culture, blood (routine x 2)     Status: None (Preliminary result)   Collection Time: 08/08/21 11:31 AM   Specimen: BLOOD  Result Value Ref Range Status   Specimen Description BLOOD  LAV  Final   Special Requests   Final    BOTTLES DRAWN AEROBIC AND ANAEROBIC Blood Culture adequate volume   Culture   Final    NO GROWTH 4 DAYS Performed at [facility], [address] [place], [place] [address]    Report Status PENDING  Incomplete         Radiology Studies: CT ANGIO HEAD NECK W WO CM  Result Date: 08/12/2021 CLINICAL DATA:  Follow-up examination for acute stroke, possible dural sinus thrombosis. EXAM: CT ANGIOGRAPHY HEAD AND NECK CT VENOGRAM HEAD TECHNIQUE: Multidetector CT imaging of the head and neck was performed using the standard protocol during bolus administration of intravenous contrast. Multiplanar CT image reconstructions and MIPs were obtained to evaluate the vascular anatomy. Carotid stenosis measurements (when applicable) are obtained utilizing NASCET criteria, using the distal internal carotid diameter as the denominator. Dedicated CT imaging of the dural venous sinuses using the standard protocol following the administration of IV contrast. CONTRAST:  37m OMNIPAQUE IOHEXOL 350 MG/ML SOLN COMPARISON:  Comparison made with previous MRI from 08/10/2021. FINDINGS: CT HEAD FINDINGS Brain: Cerebral volume within normal limits. Previously identified punctate right frontal infarcts not visible by CT. No other acute large vessel territory infarct. No intracranial hemorrhage. No mass lesion, mass effect or midline shift. No hydrocephalus or extra-axial fluid collection. Vascular: No hyperdense vessel. Skull: Scalp soft tissues and calvarium within normal limits. Sinuses: Scattered mucosal thickening within the ethmoidal air cells and maxillary sinuses. Trace chronic right mastoid effusion. Orbits: Globes and orbital  soft tissues within normal limits. Review of the MIP images confirms the above findings CTA NECK FINDINGS Aortic arch: Visualized aortic arch normal in caliber with normal 3 vessel morphology. Minor for age atheromatous change along the undersurface of the aortic  arch. No stenosis about the origin of the great vessels. Right carotid system: Right CCA widely patent without stenosis. Ill-defined soft tissue density partially surrounding the proximal and mid right CCA consistent with post treatment changes. Mild for age atheromatous change about the right carotid bulb and proximal right ICA without significant stenosis. Right ICA widely patent distally without stenosis dissection or occlusion. Left carotid system: Left CCA patent from its origin to the bifurcation without stenosis. Mild for age eccentric mixed plaque at the proximal left ICA without significant stenosis. Left ICA patent distally without stenosis, dissection or occlusion. Vertebral arteries: Both vertebral arteries arise from the subclavian arteries. No proximal subclavian artery stenosis. Similarly, ill-defined soft tissue partially surrounding the proximal right vertebral artery consistent with post treatment changes. Vertebral arteries widely patent without stenosis, dissection or occlusion. Skeleton: No visible acute osseous finding. No discrete or worrisome osseous lesions. Torus mandibularis noted. Other neck: Post treatment changes seen within the mid and lower right neck. Mucosal edema within the right greater than left glottic and supraglottic larynx likely reflects post radiation changes. Layering secretions noted within the hypopharynx. Scattered soft tissue stranding within the right anterolateral neck and retropharyngeal space also consistent with post radiation changes. Patient's known ill-defined nodal conglomerate just to the right of the esophagus at the thoracic inlet again seen, somewhat difficult to measure, but grossly similar from  previous. Possible invasion of the adjacent esophagus which demonstrates irregular ill-defined wall thickening again noted. Changes are relatively similar to prior neck CT from 04/01/2021. Upper chest: Right greater than left layering pleural effusions partially visualized. Associated atelectasis and/or consolidation at the partially visualized right posterior lung. 1.7 cm nodule present at the posterior right upper lobe, measuring slightly larger as compared to recent chest CT from 08/08/2021. Underlying emphysematous changes noted. 1.6 cm precarinal lymph node noted, indeterminate (series 9, image 1). Review of the MIP images confirms the above findings CTA HEAD FINDINGS Anterior circulation: Both internal carotid arteries widely patent to the termini without stenosis. 6 mm focal outpouching arising from the cavernous left ICA consistent with aneurysm (series 11, image 252). This projects laterally. A1 segments patent bilaterally. Normal anterior communicating artery complex. Anterior cerebral arteries patent to their distal aspects without stenosis. No M1 stenosis or occlusion. Normal MCA bifurcations. Distal MCA branches well perfused and symmetric. Posterior circulation: Both V4 segments patent to the vertebrobasilar junction without stenosis. Both PICA origins patent and normal. Basilar widely patent to its distal aspect without stenosis. Superior cerebellar arteries patent bilaterally. Both PCAs primarily supplied via the basilar and are well perfused to there distal aspects. Venous sinuses: Additional delayed venous phase images were performed. Normal enhancement seen throughout the superior sagittal sinus to the level of the torcula. Left transverse and sigmoid sinus is patent as is the visualized left internal jugular vein. Dominant right transverse and sigmoid sinuses demonstrate heterogeneous early enhancement, but are seen to fill and opacify with additional delayed images, consistent with slow but  patent flow. Straight sinus, vein of Galen, internal cerebral veins, and basal veins of Rosenthal are patent. Cavernous sinus appears patent without abnormality. Superior orbital veins grossly symmetric and normal. No appreciable cortical vein thrombosis. Anatomic variants: None significant. Review of the MIP images confirms the above findings IMPRESSION: CT HEAD IMPRESSION: Stable head CT. 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 No other acute intracranial abnormality. CTA HEAD AND NECK IMPRESSION: 1. Negative CTA for large vessel occlusion. Mild atheromatous disease for age, with no hemodynamically  significant or correctable stenosis. 2. 6 mm cavernous left ICA aneurysm. 3. Post treatment changes within the lower neck with similar size and appearance of ill-defined right paraesophageal nodal mass. Possible invasion of the adjacent esophagus again noted. 4. Right greater than left layering pleural effusions with associated atelectasis and/or consolidation. 5. 1.7 cm right upper lobe nodule, consistent with metastatic disease. This measures slightly larger in size as compared to recent chest CT from 08/08/2021. 6. 1.6 cm precarinal lymph node, indeterminate. Attention at follow-up recommended. CT VENOGRAM IMPRESSION: 1. No evidence for dural venous sinus thrombosis. 2. Evidence for patent but slow flow within the dominant right transverse and sigmoid sinuses, corresponding with the asymmetric FLAIR signal intensity seen on prior MRI. Electronically Signed   By: Jeannine Boga M.D.   On: 08/12/2021 02:12   DG Wrist 2 Views Right  Result Date: 08/10/2021 CLINICAL DATA:  Hand and wrist soreness and swelling EXAM: RIGHT WRIST - 2 VIEW; RIGHT HAND - 2 VIEW COMPARISON:  None. FINDINGS: Wrist: There is no acute fracture or dislocation. There is minimal positive ulnar variance. Alignment is otherwise normal. The joint spaces are preserved. The soft tissues are  unremarkable. Hand: There is no acute fracture or dislocation. Alignment is normal. There is degenerative change at the thumb MCP joint. The joint spaces are otherwise preserved. The soft tissues are unremarkable. IMPRESSION: Mild degenerative change at the thumb MCP joint. Otherwise, unremarkable hand and wrist radiographs. Electronically Signed   By: Valetta Mole M.D.   On: 08/10/2021 13:33   MR BRAIN WO CONTRAST  Result Date: 08/11/2021 CLINICAL DATA:  Initial evaluation for neuro deficit, stroke suspected, cervical radiculopathy. EXAM: MRI HEAD WITHOUT CONTRAST TECHNIQUE: Multiplanar, multiecho pulse sequences of the brain and surrounding structures were obtained without intravenous contrast. COMPARISON:  Prior study from 08/08/2021. FINDINGS: MRI HEAD FINDINGS Brain: Cerebral volume within normal limits. No significant cerebral white matter disease for age. There are 2 subtle adjacent punctate foci of diffusion abnormality involving the subcortical aspect of the posterior right frontal region (series 5, images 36, 35), suspicious for tiny acute to early subacute ischemic infarcts. No associated hemorrhage or mass effect. No other diffusion abnormality to suggest acute or subacute ischemia. Gray-white matter differentiation otherwise maintained. No encephalomalacia to suggest chronic cortical infarction elsewhere within the brain. No other evidence for acute or chronic intracranial hemorrhage. No mass lesion, midline shift or mass effect. No hydrocephalus or extra-axial fluid collection. Pituitary gland and suprasellar region within normal limits. Midline structures intact. Vascular: There is asymmetric FLAIR signal with intermediate T1 signal intensity involving the right transverse and sigmoid sinus (series 15, image 14). While this finding is favored to reflect slow/sluggish flow, possible thrombus is difficult to exclude. Major intracranial vascular flow voids are otherwise maintained. Skull and upper  cervical spine: Craniocervical junction within normal limits. Bone marrow signal intensity normal. No focal marrow replacing lesion. No scalp soft tissue abnormality. Sinuses/Orbits: Globes and orbital soft tissues within normal limits. Mild scattered mucosal thickening noted within the ethmoidal air cells and maxillary sinuses. Paranasal sinuses are otherwise clear. Small right mastoid effusion noted, of doubtful significance. Inner ear structures grossly normal. Other: None. IMPRESSION: 1. Two subtle adjacent punctate foci of diffusion abnormality involving the subcortical aspect of the posterior right frontal region, suspicious for tiny acute to early subacute ischemic infarcts. No associated hemorrhage or mass effect. 2. Asymmetric FLAIR signal intensity involving the right transverse and sigmoid sinus. While this finding is favored to reflect slow/sluggish flow, possible thrombus  is difficult to exclude. Further assessment with dedicated MRV, with and without contrast, suggested for further evaluation. 3. Otherwise normal brain MRI for age. Electronically Signed   By: Jeannine Boga M.D.   On: 08/11/2021 00:53   MR CERVICAL SPINE WO CONTRAST  Result Date: 08/11/2021 CLINICAL DATA:  Initial evaluation for cervical radiculopathy. History of oropharyngeal squamous cell carcinoma. EXAM: MRI CERVICAL SPINE WITHOUT CONTRAST TECHNIQUE: Multiplanar, multisequence MR imaging of the cervical spine was performed. No intravenous contrast was administered. COMPARISON:  Prior CT of the neck from 01/05/2021. FINDINGS: Alignment: Straightening of the normal cervical lordosis. No listhesis. Vertebrae: Vertebral body height maintained without acute or chronic fracture. Suspected postradiation changes with possible fatty marrow conversion within the visualized osseous structures. Mild marrow edema involving the anterior aspects of the C4 through C7 vertebral bodies could be post treatment related as well. No discrete or  worrisome osseous lesions. Facet arthrosis with associated marrow edema present about the left C3-4 facet, which could contribute to neck pain. Cord: Normal signal and morphology. Posterior Fossa, vertebral arteries, paraspinal tissues: Craniocervical junction within normal limits. Irregular appearance of the visualized hypopharynx with layering retropharyngeal effusion and right paraesophageal nodal conglomerate, grossly similar to prior neck CT. Normal flow voids seen within the vertebral arteries bilaterally. Disc levels: C2-C3: Mild left eccentric disc bulge with left-sided uncinate spurring. Mild left-sided facet hypertrophy. No spinal stenosis. Foramina remain patent. C3-C4: Broad-based posterior disc osteophyte complex flattens and partially effaces the ventral thecal sac, asymmetric to the left. Mild left-sided spinal stenosis without cord impingement. Superimposed moderate left-sided facet degeneration. Moderate left with mild right C4 foraminal narrowing. C4-C5: Mild disc bulge with uncovertebral spurring. Mild left greater than right facet hypertrophy. No spinal stenosis. Mild bilateral C5 foraminal narrowing. C5-C6: Broad-based disc osteophyte complex flattens and partially effaces the ventral thecal sac, asymmetric to the right. Mild spinal stenosis with mild flattening of the ventral cord, but no cord signal changes. Severe right worse than left C6 foraminal narrowing. C6-C7: Minimal disc bulge with bilateral uncovertebral spurring. No spinal stenosis. Superimposed mild left-sided facet hypertrophy. Mild bilateral C7 foraminal narrowing. C7-T1: Negative interspace. Mild left-sided facet hypertrophy. No stenosis. Visualized upper thoracic spine demonstrates no significant finding. IMPRESSION: 1. Degenerative disc osteophyte at C5-6 with resultant mild spinal stenosis, with severe bilateral C6 foraminal narrowing. 2. Left eccentric disc osteophyte and facet arthrosis at C3-4 with resultant mild  left-sided spinal stenosis, with moderate left C4 foraminal narrowing. 3. More mild multilevel spondylosis elsewhere within the cervical spine as above. No other significant stenosis or neural impingement. 4. Post treatment changes within the visualized neck with associated right paraesophageal nodal conglomerate, partially visualized, but grossly similar to previous neck CT from 01/05/2021. Electronically Signed   By: Jeannine Boga M.D.   On: 08/11/2021 01:10   DG Hand 2 View Right  Result Date: 08/10/2021 CLINICAL DATA:  Hand and wrist soreness and swelling EXAM: RIGHT WRIST - 2 VIEW; RIGHT HAND - 2 VIEW COMPARISON:  None. FINDINGS: Wrist: There is no acute fracture or dislocation. There is minimal positive ulnar variance. Alignment is otherwise normal. The joint spaces are preserved. The soft tissues are unremarkable. Hand: There is no acute fracture or dislocation. Alignment is normal. There is degenerative change at the thumb MCP joint. The joint spaces are otherwise preserved. The soft tissues are unremarkable. IMPRESSION: Mild degenerative change at the thumb MCP joint. Otherwise, unremarkable hand and wrist radiographs. Electronically Signed   By: Valetta Mole M.D.   On: 08/10/2021  13:33   CT VENOGRAM HEAD  Result Date: 08/12/2021 CLINICAL DATA:  Follow-up examination for acute stroke, possible dural sinus thrombosis. EXAM: CT ANGIOGRAPHY HEAD AND NECK CT VENOGRAM HEAD TECHNIQUE: Multidetector CT imaging of the head and neck was performed using the standard protocol during bolus administration of intravenous contrast. Multiplanar CT image reconstructions and MIPs were obtained to evaluate the vascular anatomy. Carotid stenosis measurements (when applicable) are obtained utilizing NASCET criteria, using the distal internal carotid diameter as the denominator. Dedicated CT imaging of the dural venous sinuses using the standard protocol following the administration of IV contrast. CONTRAST:  66m  OMNIPAQUE IOHEXOL 350 MG/ML SOLN COMPARISON:  Comparison made with previous MRI from 08/10/2021. FINDINGS: CT HEAD FINDINGS Brain: Cerebral volume within normal limits. Previously identified punctate right frontal infarcts not visible by CT. No other acute large vessel territory infarct. No intracranial hemorrhage. No mass lesion, mass effect or midline shift. No hydrocephalus or extra-axial fluid collection. Vascular: No hyperdense vessel. Skull: Scalp soft tissues and calvarium within normal limits. Sinuses: Scattered mucosal thickening within the ethmoidal air cells and maxillary sinuses. Trace chronic right mastoid effusion. Orbits: Globes and orbital soft tissues within normal limits. Review of the MIP images confirms the above findings CTA NECK FINDINGS Aortic arch: Visualized aortic arch normal in caliber with normal 3 vessel morphology. Minor for age atheromatous change along the undersurface of the aortic arch. No stenosis about the origin of the great vessels. Right carotid system: Right CCA widely patent without stenosis. Ill-defined soft tissue density partially surrounding the proximal and mid right CCA consistent with post treatment changes. Mild for age atheromatous change about the right carotid bulb and proximal right ICA without significant stenosis. Right ICA widely patent distally without stenosis dissection or occlusion. Left carotid system: Left CCA patent from its origin to the bifurcation without stenosis. Mild for age eccentric mixed plaque at the proximal left ICA without significant stenosis. Left ICA patent distally without stenosis, dissection or occlusion. Vertebral arteries: Both vertebral arteries arise from the subclavian arteries. No proximal subclavian artery stenosis. Similarly, ill-defined soft tissue partially surrounding the proximal right vertebral artery consistent with post treatment changes. Vertebral arteries widely patent without stenosis, dissection or occlusion.  Skeleton: No visible acute osseous finding. No discrete or worrisome osseous lesions. Torus mandibularis noted. Other neck: Post treatment changes seen within the mid and lower right neck. Mucosal edema within the right greater than left glottic and supraglottic larynx likely reflects post radiation changes. Layering secretions noted within the hypopharynx. Scattered soft tissue stranding within the right anterolateral neck and retropharyngeal space also consistent with post radiation changes. Patient's known ill-defined nodal conglomerate just to the right of the esophagus at the thoracic inlet again seen, somewhat difficult to measure, but grossly similar from previous. Possible invasion of the adjacent esophagus which demonstrates irregular ill-defined wall thickening again noted. Changes are relatively similar to prior neck CT from 04/01/2021. Upper chest: Right greater than left layering pleural effusions partially visualized. Associated atelectasis and/or consolidation at the partially visualized right posterior lung. 1.7 cm nodule present at the posterior right upper lobe, measuring slightly larger as compared to recent chest CT from 08/08/2021. Underlying emphysematous changes noted. 1.6 cm precarinal lymph node noted, indeterminate (series 9, image 1). Review of the MIP images confirms the above findings CTA HEAD FINDINGS Anterior circulation: Both internal carotid arteries widely patent to the termini without stenosis. 6 mm focal outpouching arising from the cavernous left ICA consistent with aneurysm (series 11, image 252).  This projects laterally. A1 segments patent bilaterally. Normal anterior communicating artery complex. Anterior cerebral arteries patent to their distal aspects without stenosis. No M1 stenosis or occlusion. Normal MCA bifurcations. Distal MCA branches well perfused and symmetric. Posterior circulation: Both V4 segments patent to the vertebrobasilar junction without stenosis. Both PICA  origins patent and normal. Basilar widely patent to its distal aspect without stenosis. Superior cerebellar arteries patent bilaterally. Both PCAs primarily supplied via the basilar and are well perfused to there distal aspects. Venous sinuses: Additional delayed venous phase images were performed. Normal enhancement seen throughout the superior sagittal sinus to the level of the torcula. Left transverse and sigmoid sinus is patent as is the visualized left internal jugular vein. Dominant right transverse and sigmoid sinuses demonstrate heterogeneous early enhancement, but are seen to fill and opacify with additional delayed images, consistent with slow but patent flow. Straight sinus, vein of Galen, internal cerebral veins, and basal veins of Rosenthal are patent. Cavernous sinus appears patent without abnormality. Superior orbital veins grossly symmetric and normal. No appreciable cortical vein thrombosis. Anatomic variants: None significant. Review of the MIP images confirms the above findings IMPRESSION: CT HEAD IMPRESSION: Stable head CT. 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 No other acute intracranial abnormality. CTA HEAD AND NECK IMPRESSION: 1. Negative CTA for large vessel occlusion. Mild atheromatous disease for age, with no hemodynamically significant or correctable stenosis. 2. 6 mm cavernous left ICA aneurysm. 3. Post treatment changes within the lower neck with similar size and appearance of ill-defined right paraesophageal nodal mass. Possible invasion of the adjacent esophagus again noted. 4. Right greater than left layering pleural effusions with associated atelectasis and/or consolidation. 5. 1.7 cm right upper lobe nodule, consistent with metastatic disease. This measures slightly larger in size as compared to recent chest CT from 08/08/2021. 6. 1.6 cm precarinal lymph node, indeterminate. Attention at follow-up recommended. CT VENOGRAM IMPRESSION: 1.  No evidence for dural venous sinus thrombosis. 2. Evidence for patent but slow flow within the dominant right transverse and sigmoid sinuses, corresponding with the asymmetric FLAIR signal intensity seen on prior MRI. Electronically Signed   By: Jeannine Boga M.D.   On: 08/12/2021 02:12        Scheduled Meds:  aspirin EC  81 mg Oral Daily   atorvastatin  80 mg Per Tube Daily   Chlorhexidine Gluconate Cloth  6 each Topical Daily   enoxaparin (LOVENOX) injection  40 mg Subcutaneous Q24H   feeding supplement (KATE FARMS STANDARD 1.4)  325 mL Per Tube 5 X Daily   free water  120 mL Per Tube 5 X Daily   insulin aspart  0-9 Units Subcutaneous Q4H   levothyroxine  50 mcg Per Tube Q0600   pantoprazole sodium  40 mg Per Tube Daily   polyethylene glycol  17 g Per Tube Daily   Continuous Infusions:  cefTRIAXone (ROCEPHIN)  IV 2 g (08/11/21 1735)   lactated ringers     metronidazole 500 mg (08/12/21 0107)     LOS: 4 days    Time spent:25 mins. More than 50% of that time was spent in counseling and/or coordination of care.      Shelly Coss, MD [facility] P9/08/2021, 8:15 AM

## 2021-08-12 NOTE — Progress Notes (Signed)
Neurology Progress Note  Patient ID: Ivan Garcia. is a 65 y.o. with PMHx of squamous cell carcinoma of the oropharynx (diagnosed 12/14/2017, status post multiple rounds of chemotherapy, radiation), feeding tube, hypertension, hyperlipidemia, arthritis.  Initially consulted for: Possible stroke on MRI, progressive right upper extremity weakness  Subjective: Reports he has been working on his physical therapy exercises a lot and feels that his right upper extremity symptoms are stable Denies any new neurological complaints including headache, vision changes Reports his pain is well controlled on his current pain medication regimen   Current Facility-Administered Medications:    ALPRAZolam (XANAX) tablet 0.25 mg, 0.25 mg, Oral, QHS PRN, Sharion Settler, NP, 0.25 mg at 08/10/21 1058   aspirin EC tablet 81 mg, 81 mg, Oral, Daily, Adhikari, Amrit, MD, 81 mg at 08/12/21 0955   atorvastatin (LIPITOR) tablet 40 mg, 40 mg, Per Tube, Daily, Adhikari, Amrit, MD   cefTRIAXone (ROCEPHIN) 2 g in sodium chloride 0.9 % 100 mL IVPB, 2 g, Intravenous, Q24H, Zhang, Dekui, MD, Last Rate: 200 mL/hr at 08/11/21 1735, 2 g at 08/11/21 1735   Chlorhexidine Gluconate Cloth 2 % PADS 6 each, 6 each, Topical, Daily, Sharen Hones, MD, 6 each at 08/12/21 0906   enoxaparin (LOVENOX) injection 40 mg, 40 mg, Subcutaneous, Q24H, [place], Dekui, MD, 40 mg at 08/11/21 1732   feeding supplement (KATE FARMS STANDARD 1.4) liquid 325 mL, 325 mL, Per Tube, 5 X Daily, Adhikari, Amrit, MD, 325 mL at 08/12/21 0955   free water 120 mL, 120 mL, Per Tube, 5 X Daily, Adhikari, Amrit, MD, 120 mL at 08/12/21 0955   ibuprofen (ADVIL) tablet 600 mg, 600 mg, Oral, Q6H PRN, Adhikari, Amrit, MD   insulin aspart (novoLOG) injection 0-9 Units, 0-9 Units, Subcutaneous, Q4H, Sharen Hones, MD, 1 Units at 08/12/21 0955   lactated ringers bolus 1,000 mL, 1,000 mL, Intravenous, Once **AND** [COMPLETED] lactated ringers bolus 1,000 mL, 1,000 mL,  Intravenous, Once, Stopped at 08/08/21 1333 **AND** [COMPLETED] lactated ringers bolus 1,000 mL, 1,000 mL, Intravenous, Once, Nena Polio, MD, Stopped at 08/08/21 1218   levothyroxine (SYNTHROID) tablet 50 mcg, 50 mcg, Per Tube, Q0600, Shelly Coss, MD, 50 mcg at 08/12/21 0535   metroNIDAZOLE (FLAGYL) IVPB 500 mg, 500 mg, Intravenous, Q8H, Sharen Hones, MD, Last Rate: 100 mL/hr at 08/12/21 1013, 500 mg at 08/12/21 1013   morphine 2 MG/ML injection 2 mg, 2 mg, Intravenous, Q4H PRN, Adhikari, Amrit, MD   pantoprazole sodium (PROTONIX) 40 mg oral suspension 40 mg, 40 mg, Per Tube, Daily, Adhikari, Amrit, MD, 40 mg at 08/12/21 0955   polyethylene glycol (MIRALAX / GLYCOLAX) packet 17 g, 17 g, Per Tube, Daily, Adhikari, Amrit, MD, 17 g at 08/10/21 0928   traMADol (ULTRAM) tablet 50 mg, 50 mg, Oral, Q6H PRN, Tawanna Solo, Amrit, MD, 50 mg at 08/12/21 0542  Facility-Administered Medications Ordered in Other Encounters:    sodium chloride flush (NS) 0.9 % injection 10 mL, 10 mL, Intravenous, PRN, Earlie Server, MD   sodium chloride flush (NS) 0.9 % injection 10 mL, 10 mL, Intravenous, PRN, Earlie Server, MD, 10 mL at 10/13/20 1034   Exam: Vitals:   08/12/21 0848 08/12/21 1153  BP: (!) 135/101 (!) 148/100  Pulse: (!) 107 (!) 108  Resp: 18 18  Temp: 98 F (36.7 C) 97.8 F (36.6 C)  SpO2: 94% 94%   Gen: In bed, comfortable  Resp: non-labored breathing, no grossly audible wheezing Cardiac: Perfusing extremities well  Abd: soft, nt  Neuro: MS:  Awake, alert, oriented to person place and situation CN: Right eye myosis and ptosis, stable, and otherwise remains intact for all cranial nerves Motor: Worsening right upper extremity weakness on my evaluation today, with elbow flexion now 1/5 (2/5 yesterday), remains 1/5 at the deltoid, 2/5 elbow extension, 4+/5 wrist extension, 4/5 wrist flexion, 4/5 finger extension and 5/5 finger flexion Sensory: Does clarify that he has stable numbness isolated to the hand  without any progression of sensory symptoms on my testing   Pertinent data: CTA HEAD AND NECK personally reviewed, agree with radiology IMPRESSION: 1. Negative CTA for large vessel occlusion. Mild atheromatous disease for age, with no hemodynamically significant or correctable stenosis. 2. 6 mm cavernous left ICA aneurysm. 3. Post treatment changes within the lower neck with similar size and appearance of ill-defined right paraesophageal nodal mass. Possible invasion of the adjacent esophagus again noted. 4. Right greater than left layering pleural effusions with associated atelectasis and/or consolidation. 5. 1.7 cm right upper lobe nodule, consistent with metastatic disease. This measures slightly larger in size as compared to recent chest CT from 08/08/2021. 6. 1.6 cm precarinal lymph node, indeterminate. Attention at follow-up recommended.   CT VENOGRAM personally reviewed, agree with radiology IMPRESSION: 1. No evidence for dural venous sinus thrombosis. 2. Evidence for patent but slow flow within the dominant right transverse and sigmoid sinuses, corresponding with the asymmetric FLAIR signal intensity seen on prior MRI.   Lab Results  Component Value Date   CHOL 168 08/12/2021   HDL 68 08/12/2021   LDLCALC 85 08/12/2021   TRIG 75 08/12/2021   CHOLHDL 2.5 08/12/2021   Lab Results  Component Value Date   HGBA1C 6.0 (H) 08/09/2021     Impression: Mild progression of the patient's right upper extremity weakness on my evaluation today.  Notably imaging is concerning for progression of his malignancy even within a few days with enlarging lung nodule.  MRI is still pending.  Discussed with Dr. Mickeal Skinner and Dr. Cari Caraway of neuro oncology and neurosurgery as well as Dr. Einar Grad, appreciate neurosurgical evaluation patient today after MRIs as ordered are completed  Recommendations:  #Progressive right upper extremity weakness -Neurology will follow up imaging still  pending -Appreciate Dr. Nelly Laurence evaluation today -Appreciate management of comorbidities per primary team  # Possible punctate stroke - A1c at goal and LDL just above goal at 85  -Agree with atorvastatin 40 mg daily, increased from home dose of 20 mg - Echocardiogram pending, neurology will follow up - Aspirin  81 mg daily alone given very small stroke, reassuring vessel imaging and likelihood of needing further procedures for his malignancy (which would be complicated by the addition of Plavix which typically needs to be held for at least 5 to 7 days prior to procedure) - Risk factor modification discussed with patient - Telemetry monitoring - Blood pressure goal              - Normotension given very small stroke versus artifact  Lesleigh Noe MD-PhD [facility] [phone]

## 2021-08-13 DIAGNOSIS — I429 Cardiomyopathy, unspecified: Secondary | ICD-10-CM

## 2021-08-13 DIAGNOSIS — Z7189 Other specified counseling: Secondary | ICD-10-CM

## 2021-08-13 DIAGNOSIS — E785 Hyperlipidemia, unspecified: Secondary | ICD-10-CM

## 2021-08-13 DIAGNOSIS — R0902 Hypoxemia: Secondary | ICD-10-CM | POA: Diagnosis not present

## 2021-08-13 DIAGNOSIS — J90 Pleural effusion, not elsewhere classified: Secondary | ICD-10-CM

## 2021-08-13 DIAGNOSIS — R29898 Other symptoms and signs involving the musculoskeletal system: Secondary | ICD-10-CM

## 2021-08-13 DIAGNOSIS — J9601 Acute respiratory failure with hypoxia: Secondary | ICD-10-CM | POA: Diagnosis not present

## 2021-08-13 DIAGNOSIS — C760 Malignant neoplasm of head, face and neck: Secondary | ICD-10-CM

## 2021-08-13 DIAGNOSIS — I6389 Other cerebral infarction: Secondary | ICD-10-CM

## 2021-08-13 DIAGNOSIS — C799 Secondary malignant neoplasm of unspecified site: Secondary | ICD-10-CM

## 2021-08-13 DIAGNOSIS — G540 Brachial plexus disorders: Secondary | ICD-10-CM

## 2021-08-13 DIAGNOSIS — J690 Pneumonitis due to inhalation of food and vomit: Secondary | ICD-10-CM | POA: Diagnosis not present

## 2021-08-13 LAB — BASIC METABOLIC PANEL
Anion gap: 5 (ref 5–15)
BUN: 18 mg/dL (ref 8–23)
CO2: 29 mmol/L (ref 22–32)
Calcium: 8.4 mg/dL — ABNORMAL LOW (ref 8.9–10.3)
Chloride: 95 mmol/L — ABNORMAL LOW (ref 98–111)
Creatinine, Ser: 0.75 mg/dL (ref 0.61–1.24)
GFR, Estimated: >60 mL/min (ref >60)
Glucose, Bld: 114 mg/dL — ABNORMAL HIGH (ref 70–99)
Potassium: 3.9 mmol/L (ref 3.5–5.1)
Sodium: 129 mmol/L — ABNORMAL LOW (ref 135–145)

## 2021-08-13 LAB — CULTURE, BLOOD (SINGLE)
Culture: NO GROWTH
Special Requests: ADEQUATE

## 2021-08-13 LAB — CULTURE, BLOOD (ROUTINE X 2)
Culture: NO GROWTH
Special Requests: ADEQUATE

## 2021-08-13 LAB — GLUCOSE, CAPILLARY
Glucose-Capillary: 89 mg/dL (ref 70–99)
Glucose-Capillary: 107 mg/dL — ABNORMAL HIGH (ref 70–99)
Glucose-Capillary: 82 mg/dL (ref 70–99)
Glucose-Capillary: 178 mg/dL — ABNORMAL HIGH (ref 70–99)
Glucose-Capillary: 111 mg/dL — ABNORMAL HIGH (ref 70–99)

## 2021-08-13 MED ORDER — LABETALOL HCL 5 MG/ML IV SOLN: 20.0000 mg | INTRAVENOUS | Status: DC | PRN

## 2021-08-13 MED ORDER — ALPRAZOLAM 0.25 MG PO TABS: 0.2500 mg | ORAL_TABLET | Freq: Every evening | ORAL | 0 refills | Status: AC | PRN

## 2021-08-13 MED ORDER — ALPRAZOLAM 0.25 MG PO TABS
0.2500 mg | ORAL_TABLET | Freq: Every evening | ORAL | Status: DC | PRN
  Administered 2021-08-13 (×2): 0.25 mg
  Filled 2021-08-13 (×2): qty 1

## 2021-08-13 MED ORDER — SODIUM CHLORIDE 0.9 % IV SOLN
INTRAVENOUS | Status: DC | PRN
  Administered 2021-08-13: 09:00:00 250 mL via INTRAVENOUS

## 2021-08-13 MED ORDER — IBUPROFEN 100 MG/5ML PO SUSP: 600.0000 mg | Freq: Four times a day (QID) | ORAL | 0 refills | Status: DC | PRN

## 2021-08-13 MED ORDER — ATORVASTATIN CALCIUM 40 MG PO TABS: 40.0000 mg | ORAL_TABLET | Freq: Every day | ORAL | 0 refills | Status: AC

## 2021-08-13 MED ORDER — ENOXAPARIN SODIUM 40 MG/0.4ML IJ SOSY: 40.0000 mg | PREFILLED_SYRINGE | INTRAMUSCULAR | 0 refills | Status: AC

## 2021-08-13 MED ORDER — ASPIRIN 81 MG PO CHEW: 81.0000 mg | CHEWABLE_TABLET | Freq: Every day | ORAL | 0 refills | Status: AC

## 2021-08-13 MED ORDER — LEVOTHYROXINE SODIUM 50 MCG PO TABS: 50.0000 ug | ORAL_TABLET | Freq: Every day | ORAL | 0 refills | Status: DC

## 2021-08-13 MED ORDER — CHLORHEXIDINE GLUCONATE CLOTH 2 % EX PADS: 6.0000 | MEDICATED_PAD | Freq: Every day | CUTANEOUS | 0 refills | Status: DC

## 2021-08-13 MED ORDER — LABETALOL HCL 5 MG/ML IV SOLN: 10.0000 mg | INTRAVENOUS | Status: DC | PRN

## 2021-08-13 MED ORDER — SODIUM CHLORIDE 0.9 % IV SOLN: 1000.0000 mL | INTRAVENOUS | 0 refills | Status: AC | PRN

## 2021-08-13 MED ORDER — TRAMADOL HCL 50 MG PO TABS: 50.0000 mg | ORAL_TABLET | Freq: Four times a day (QID) | ORAL | 0 refills | Status: AC | PRN

## 2021-08-13 MED ORDER — METOPROLOL SUCCINATE ER 25 MG PO TB24
25.0000 mg | ORAL_TABLET | Freq: Every day | ORAL | Status: DC
  Administered 2021-08-13: 11:00:00 25 mg via ORAL
  Filled 2021-08-13: qty 1

## 2021-08-13 MED ORDER — DIPHENOXYLATE-ATROPINE 2.5-0.025 MG/5ML PO LIQD: 5.0000 mL | Freq: Four times a day (QID) | ORAL | 0 refills | Status: AC | PRN

## 2021-08-13 MED ORDER — IBUPROFEN 100 MG/5ML PO SUSP
600.0000 mg | Freq: Four times a day (QID) | ORAL | Status: DC | PRN
  Filled 2021-08-13: qty 30

## 2021-08-13 MED ORDER — LACTATED RINGERS IV BOLUS (SEPSIS): 1000.0000 mL | Freq: Once | INTRAVENOUS | 0 refills | Status: AC

## 2021-08-13 MED ORDER — DIPHENOXYLATE-ATROPINE 2.5-0.025 MG/5ML PO LIQD: 5.0000 mL | Freq: Four times a day (QID) | ORAL | Status: DC | PRN

## 2021-08-13 MED ORDER — LABETALOL HCL 5 MG/ML IV SOLN: 10.0000 mg | INTRAVENOUS | 0 refills | Status: AC | PRN

## 2021-08-13 MED ORDER — MORPHINE SULFATE (PF) 2 MG/ML IV SOLN: 2.0000 mg | INTRAVENOUS | 0 refills | Status: AC | PRN

## 2021-08-13 MED ORDER — METOPROLOL SUCCINATE ER 25 MG PO TB24: 25.0000 mg | ORAL_TABLET | Freq: Every day | ORAL | 0 refills | Status: AC

## 2021-08-13 MED ORDER — FREE WATER: 120.0000 mL | Freq: Every day | 0 refills | Status: AC

## 2021-08-13 MED ORDER — INSULIN ASPART 100 UNIT/ML IJ SOLN: 0.0000 [IU] | INTRAMUSCULAR | 11 refills | Status: AC

## 2021-08-13 MED ORDER — PANTOPRAZOLE SODIUM 40 MG PO PACK: 40.0000 mg | PACK | Freq: Every day | ORAL | 0 refills | Status: AC

## 2021-08-13 MED ORDER — ASPIRIN 81 MG PO CHEW
81.0000 mg | CHEWABLE_TABLET | Freq: Every day | ORAL | Status: DC
  Administered 2021-08-13: 81 mg
  Filled 2021-08-13: qty 1

## 2021-08-13 MED ORDER — KATE FARMS STANDARD 1.4 PO LIQD: 325.0000 mL | Freq: Every day | ORAL | 0 refills | Status: AC

## 2021-08-13 NOTE — Consult Note (Signed)
Cardiology Consultation:   Patient ID: Ivan Garcia.; CX:[id number]; 1956/06/01   Admit date: 08/08/2021 Date of Consult: 08/13/2021  Primary Care Provider: Albina Billet, MD Primary Cardiologist: [facility] Primary Electrophysiologist:  None   Patient Profile:   Ivan Ghee Xzaiver Benites. is a 65 y.o. 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 Tawanna Solo.  History of Present Illness:   Ivan Garcia underwent clinical trial at [facility] with cetuximab.  Cycle 1, day 1 he developed a grade 4 infusion related reaction with associated anaphylaxis, and syncope while on the toilet progressing to PEA arrest requiring CPR for 30 to 60 seconds followed by ROSC.  Epinephrine was given.  He was transferred to the ED and admitted for 24 hours.  Echo at [facility] showed an EF of 60 to 65%, normal LV wall thickness, normal LV wall motion, indeterminate LV filling pattern, normal RV systolic function and ventricular cavity size, no significant valvular abnormalities, no pericardial effusion, and an estimated right atrial pressure of 3 mmHg.  He has been evaluated by [facility] cardiology with his episode of PEA felt to be related to anaphylaxis following cetuximab infusion with no further cardiac testing indicated.  Leading up to his admission, he has been strictly n.p.o. until receiving his feeding tube.  He has had difficulty ambulating secondary to severe right carpal tunnel syndrome.  He was admitted to [facility] on 08/08/2021 after he was found on the floor.  Upon presentation he was hypoxic requiring 5 L supplemental oxygen.   High-sensitivity troponin negative x2.  BNP 39.  CTA chest showed no evidence of PE, though did demonstrate multilobar pneumonia, suggestive of aspiration along with progression of metastatic disease in the right lung.  He was found to have new onset weakness of the right upper extremity with MRI of the brain showing an acute/subacute ischemic infarct in the right frontal lobe.  Echo on 9/9 showed an EF of 40 to 45%, global hypokinesis, grade 2 diastolic dysfunction, normal RV systolic function with mildly enlarged RV cavity size, trivial mitral regurgitation.  Cardiology is consulted for further evaluation of his cardiomyopathy.  No chest pain or dyspnea. He does report being told he had an "irregular heartbeat" prior to initiating cetuximab. MR chest 9/9 showed a moderate right pleural effusion. IM is planning for thoracentesis today.   Past Medical History:  Diagnosis Date   Arthritis    Cancer ([place])    Head and neck cancer   Hemorrhoids    Hyperlipidemia    Hypertension     Past Surgical History:  Procedure Laterality Date   COLONOSCOPY WITH PROPOFOL N/A 04/04/2017   Procedure: COLONOSCOPY WITH PROPOFOL;  Surgeon: Robert Bellow, MD;  Location: [facility] ENDOSCOPY;  Service: Endoscopy;  Laterality: N/A;   IR GASTROSTOMY TUBE MOD SED  09/24/2020   MANDIBLE SURGERY     PORT-A-CATH REMOVAL  07/17/2018   PORTA CATH INSERTION N/A 12/19/2017   Procedure: PORTA CATH INSERTION;  Surgeon: Algernon Huxley, MD;  Location: [place] CV LAB;  Service: Cardiovascular;  Laterality: N/A;   PORTA CATH INSERTION N/A 07/17/2018   Procedure: PORTA CATH  INSERTION;  Surgeon: Algernon Huxley, MD;  Location: [place] [facility];  Service: Cardiovascular;  Laterality: N/A;   PORTA CATH INSERTION N/A 09/18/2019   Procedure: PORTA CATH INSERTION;  Surgeon: Algernon Huxley, MD;  Location: [place] CV [facility];  Service: Cardiovascular;  Laterality: N/A;   TONSILLECTOMY       Home Meds: Prior to Admission medications    Medication Sig Start Date End Date Taking? Authorizing Provider  acetaminophen (TYLENOL) 160 MG/5ML solution Take by mouth. Patient not taking: No sig reported 05/13/21   [provider]  amLODipine (NORVASC) 5 MG tablet Take 5 mg by mouth daily.    [provider]  atorvastatin (LIPITOR) 20 MG tablet SMARTSIG:1 Tablet(s) By Mouth Every Evening 01/15/21   [provider]  cetirizine (ZYRTEC) 10 MG tablet Take by mouth. Patient not taking: No sig reported    [provider]  famotidine (PEPCID) 20 MG tablet Take by mouth. Patient not taking: No sig reported 04/29/21   [provider]  gabapentin (NEURONTIN) 100 MG capsule TAKE 2 CAPSULES BY MOUTH 3 TIMES DAILY. Patient not taking: No sig reported 05/31/21   [provider]  HYDROcodone-acetaminophen (NORCO) 10-325 MG tablet Take 1 tablet by mouth every 6 (six) hours as needed. 07/25/21   Borders, Kirt Boys, NP  ibuprofen (ADVIL) 100 MG/5ML suspension Take by mouth. Patient not taking: No sig reported 05/13/21   [provider]  lenvatinib 20 mg daily dose (LENVIMA, 20 MG DAILY DOSE,) 2 x 10 MG capsule Take 2 capsules (20 mg total) by mouth daily. 07/27/21   Earlie Server, MD  levothyroxine (SYNTHROID) 50 MCG tablet Take 1 tablet (50 mcg total) by mouth daily before breakfast. 08/02/21   Earlie Server, MD  lidocaine-prilocaine (EMLA) cream Apply to affected area once Patient not taking: No sig reported 09/10/19   Earlie Server, MD  metoprolol succinate (TOPROL-XL) 25 MG 24 hr tablet Take 1 tablet by mouth daily. Patient not taking: No sig reported 05/04/21   [provider]  nystatin (MYCOSTATIN) 100000 UNIT/ML suspension Take 5 mLs (500,000 Units total) by mouth in the morning, at noon, and at bedtime. SWISH AND SPIT Patient not taking: No sig reported 08/17/20   Earlie Server, MD  omeprazole (PRILOSEC) 20 MG capsule Take 1 capsule (20 mg total) by mouth daily. Patient not taking: No sig reported 03/03/21    Earlie Server, MD  ondansetron (ZOFRAN) 8 MG tablet Take 1 tablet (8 mg total) by mouth 2 (two) times daily as needed for refractory nausea / vomiting. Start on day 3 after carboplatin chemo. Patient not taking: No sig reported 11/04/20   Earlie Server, MD  oxyCODONE (ROXICODONE) 5 MG/5ML solution PLACE 5-10 MLS (5-10 MG TOTAL) INTO FEEDING TUBE EVERY 4 (FOUR) HOURS AS NEEDED FOR SEVERE PAIN. Patient not taking: No sig reported 05/17/21   [provider]  predniSONE (DELTASONE) 50 MG tablet Take 1 tablet (50 mg total) by mouth daily with breakfast. Patient not taking: No sig reported 07/08/21   Ventura Sellers, MD  pregabalin (LYRICA) 150 MG capsule Take 1 capsule (150 mg total) by mouth 2 (two) times daily. Patient not taking: No sig reported 07/08/21   Ventura Sellers, MD  prochlorperazine (COMPAZINE) 10 MG tablet Take 1 tablet (10 mg total) by mouth every 6 (six) hours as needed (Nausea or vomiting). Patient not taking: No sig reported 09/10/19   Earlie Server, MD  senna-docusate (SENOKOT-S) 8.6-50 MG tablet Take 2 tablets by  mouth daily. Patient not taking: No sig reported 05/27/21   Earlie Server, MD    Inpatient Medications: Scheduled Meds:  aspirin  81 mg Per Tube Daily   atorvastatin  40 mg Per Tube Daily   Chlorhexidine Gluconate Cloth  6 each Topical Daily   enoxaparin (LOVENOX) injection  40 mg Subcutaneous Q24H   feeding supplement (KATE FARMS STANDARD 1.4)  325 mL Per Tube 5 X Daily   free water  120 mL Per Tube 5 X Daily   insulin aspart  0-9 Units Subcutaneous Q4H   levothyroxine  50 mcg Per Tube Q0600   pantoprazole sodium  40 mg Per Tube Daily   polyethylene glycol  17 g Per Tube Daily   Continuous Infusions:  sodium chloride 250 mL (08/13/21 0845)   lactated ringers     metronidazole 500 mg (08/13/21 0848)   PRN Meds: sodium chloride, ALPRAZolam, ibuprofen, labetalol, morphine injection, traMADol  Allergies:   Allergies  Allergen Reactions   Cetuximab Anaphylaxis     Social History:   Social History   Socioeconomic History   Marital status: Single    Spouse name: Not on file   Number of children: Not on file   Years of education: Not on file   Highest education level: Not on file  Occupational History   Not on file  Tobacco Use   Smoking status: Never   Smokeless tobacco: Never  Vaping Use   Vaping Use: Never used  Substance and Sexual Activity   Alcohol use: No   Drug use: No   Sexual activity: Not on file  Other Topics Concern   Not on file  Social History Narrative   Not on file   Social Determinants of Health   Financial Resource Strain: Not on file  Food Insecurity: Not on file  Transportation Needs: Not on file  Physical Activity: Not on file  Stress: Not on file  Social Connections: Not on file  Intimate Partner Violence: Not on file     Family History:   Family History  Problem Relation Age of Onset   Breast cancer Mother    Asthma Mother    Congestive Heart Failure Mother    Prostate cancer Father    Brain cancer Father    Bladder Cancer Father     ROS:  Review of Systems  Constitutional:  Positive for malaise/fatigue. Negative for chills, diaphoresis, fever and weight loss.  HENT:  Negative for congestion.   Eyes:  Negative for discharge and redness.  Respiratory:  Negative for cough, sputum production, shortness of breath and wheezing.   Cardiovascular:  Negative for chest pain, palpitations, orthopnea, claudication, leg swelling and PND.  Gastrointestinal:  Negative for abdominal pain, heartburn, nausea and vomiting.  Musculoskeletal:  Positive for falls. Negative for myalgias.  Skin:  Negative for rash.  Neurological:  Positive for focal weakness and weakness. Negative for dizziness, tingling, tremors, sensory change, speech change and loss of consciousness.  Endo/Heme/Allergies:  Does not bruise/bleed easily.  Psychiatric/Behavioral:  Negative for substance abuse. The patient is not nervous/anxious.    All other systems reviewed and are negative.    Physical Exam/Data:   Vitals:   08/12/21 2350 08/13/21 0438 08/13/21 0758 08/13/21 0900  BP: (!) 152/102 (!) 139/93 (!) 147/106 (!) 147/108  Pulse: (!) 105 99 (!) 108 (!) 106  Resp: '18 18 18 18  '$ Temp: (!) 97.4 F (36.3 C) (!) 97.4 F (36.3 C) 97.8 F (36.6 C)   TempSrc:  SpO2: 94% 95% 97% 94%  Weight:      Height:        Intake/Output Summary (Last 24 hours) at 08/13/2021 1002 Last data filed at 08/13/2021 0955 Gross per 24 hour  Intake 600 ml  Output 1900 ml  Net -1300 ml   Filed Weights   08/08/21 1728  Weight: 85.8 kg   Body mass index is 27.14 kg/m.   Physical Exam: General: Well developed, well nourished, in no acute distress. Head: Normocephalic, atraumatic, sclera non-icteric, no xanthomas, nares without discharge.  Neck: Negative for carotid bruits. JVD not elevated. Lungs: Coarse breath sounds bilaterally with diminished sounds along the bases R>L. Breathing is unlabored. Heart: Mildly tachycardic with S1 S2. No murmurs, rubs, or gallops appreciated. Abdomen: Soft, non-tender, non-distended with normoactive bowel sounds. No hepatomegaly. No rebound/guarding. No obvious abdominal masses. Msk:  Strength and tone appear normal for age. Extremities: No clubbing or cyanosis. No edema. Distal pedal pulses are 2+ and equal bilaterally. Neuro: Alert and oriented X 3. No facial asymmetry. No focal deficit. Moves all extremities spontaneously. Psych:  Responds to questions appropriately with a normal affect.   EKG:  The EKG was personally reviewed and demonstrates: Sinus tachycardia, 110 bpm, nonspecific ST-T changes Telemetry:  Telemetry was personally reviewed and demonstrates: Not on telemetry   Weights: Filed Weights   08/08/21 1728  Weight: 85.8 kg    Relevant CV Studies:  2D echo 08/12/2021: 1. Left ventricular ejection fraction, by estimation, is 40 to 45%. The  left ventricle has mildly decreased  function. The left ventricle  demonstrates global hypokinesis. Left ventricular diastolic parameters are  consistent with Grade II diastolic  dysfunction (pseudonormalization).   2. Right ventricular systolic function is normal. The right ventricular  size is moderately enlarged.   3. The mitral valve is normal in structure. Trivial mitral valve  regurgitation.   4. The aortic valve is grossly normal. Aortic valve regurgitation is not  visualized.  __________  2D echo 05/2021 [facility]): TTE The left ventricular size is normal. There is normal left ventricular wall thickness. Left ventricular systolic function is normal. LV ejection fraction = 60-65%. The left ventricular wall motion is normal. Left ventricular filling pattern is indeterminate. The right ventricle is normal in size and function. There is no significant valvular stenosis or regurgitation. The IVC is normal in size with an inspiratory collapse of greater then 50%, suggesting normal right atrial pressure. There is no pericardial effusion. There is no comparison study available.   Laboratory Data:  Chemistry Recent Labs  Lab 08/10/21 0533 08/12/21 0642 08/13/21 0630  NA 133* 128* 129*  K 4.1 4.5 3.9  CL 99 90* 95*  CO2 '27 28 29  '$ GLUCOSE 98 108* 114*  BUN '21 16 18  '$ CREATININE 0.70 0.71 0.75  CALCIUM 8.1* 9.0 8.4*  GFRNONAA >60 >60 >60  ANIONGAP '7 10 5    '$ Recent Labs  Lab 08/08/21 0919  PROT 7.3  ALBUMIN 4.0  AST 48*  ALT 24  ALKPHOS 76  BILITOT 0.9   Hematology Recent Labs  Lab 08/09/21 0610 08/10/21 0533 08/12/21 0642  WBC 9.0 9.6 10.2  RBC 4.35 4.08* 4.59  HGB 13.8 12.9* 14.4  HCT 39.8 37.3* 41.4  MCV 91.5 91.4 90.2  MCH 31.7 31.6 31.4  MCHC 34.7 34.6 34.8  RDW 13.5 14.0 14.0  PLT 321 306 335   Cardiac EnzymesNo results for input(s): TROPONINI in the last 168 hours. No results for input(s): TROPIPOC in the last  168 hours.  BNP Recent Labs  Lab 08/08/21 0919  BNP 39.0    DDimer   Recent Labs  Lab 08/08/21 0919  DDIMER 1.09*    Radiology/Studies:  CT ANGIO HEAD NECK W WO CM  Result Date: 08/12/2021 IMPRESSION: 1. No evidence for dural venous sinus thrombosis. 2. Evidence for patent but slow flow within the dominant right transverse and sigmoid sinuses, corresponding with the asymmetric FLAIR signal intensity seen on prior MRI. Electronically Signed   By: Jeannine Boga M.D.   On: 08/12/2021 02:12   DG Wrist 2 Views Right  Result Date: 08/10/2021 IMPRESSION: Mild degenerative change at the thumb MCP joint. Otherwise, unremarkable hand and wrist radiographs. Electronically Signed   By: Valetta Mole M.D.   On: 08/10/2021 13:33   MR BRAIN WO CONTRAST  Result Date: 08/11/2021 IMPRESSION: 1. Two subtle adjacent punctate foci of diffusion abnormality involving the subcortical aspect of the posterior right frontal region, suspicious for tiny acute to early subacute ischemic infarcts. No associated hemorrhage or mass effect. 2. Asymmetric FLAIR signal intensity involving the right transverse and sigmoid sinus. While this finding is favored to reflect slow/sluggish flow, possible thrombus is difficult to exclude. Further assessment with dedicated MRV, with and without contrast, suggested for further evaluation. 3. Otherwise normal brain MRI for age. Electronically Signed   By: Jeannine Boga M.D.   On: 08/11/2021 00:53   MR CHEST W WO CONTRAST  Result Date: 08/12/2021 IMPRESSION: 1. 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 2. Similar right paraesophageal nodal conglomerate at the thoracic inlet. Similar post treatment changes in the larynx and surrounding the right common carotid artery. 3. Unchanged moderate right pleural effusion. Electronically Signed   By: Titus Dubin M.D.   On: 08/12/2021 20:15   MR CERVICAL SPINE WO CONTRAST  Result Date: 08/11/2021 IMPRESSION: 1. Degenerative disc osteophyte at C5-6 with resultant mild spinal stenosis,  with severe bilateral C6 foraminal narrowing. 2. Left eccentric disc osteophyte and facet arthrosis at C3-4 with resultant mild left-sided spinal stenosis, with moderate left C4 foraminal narrowing. 3. More mild multilevel spondylosis elsewhere within the cervical spine as above. No other significant stenosis or neural impingement. 4. Post treatment changes within the visualized neck with associated right paraesophageal nodal conglomerate, partially visualized, but grossly similar to previous neck CT from 01/05/2021. Electronically Signed   By: Jeannine Boga M.D.   On: 08/11/2021 01:10   MR CERVICAL SPINE W CONTRAST  Result Date: 08/12/2021 IMPRESSION: Redemonstrated small areas of enhancement at the anterior aspects of C5, C6, C7, and T1, which appear overall similar to the prior exam. No new foci of abnormal osseous enhancement. No evidence of cord enhancement. Electronically Signed   By: Merilyn Baba M.D.   On: 08/12/2021 17:19   DG Hand 2 View Right  Result Date: 08/10/2021 IMPRESSION: Mild degenerative change at the thumb MCP joint. Otherwise, unremarkable hand and wrist radiographs. Electronically Signed   By: Valetta Mole M.D.   On: 08/10/2021 13:33   CT VENOGRAM HEAD  Result Date: 08/12/2021 IMPRESSION: CT HEAD IMPRESSION: Stable head CT. 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 No other acute intracranial abnormality. CTA HEAD AND NECK IMPRESSION: 1. Negative CTA for large vessel occlusion. Mild atheromatous disease for age, with no hemodynamically significant or correctable stenosis. 2. 6 mm cavernous left ICA aneurysm. 3. Post treatment changes within the lower neck with similar size and appearance of ill-defined right paraesophageal nodal mass. Possible invasion of  the adjacent esophagus again noted. 4. Right greater than left layering pleural effusions with associated atelectasis and/or consolidation. 5. 1.7 cm right upper lobe nodule,  consistent with metastatic disease. This measures slightly larger in size as compared to recent chest CT from 08/08/2021. 6. 1.6 cm precarinal lymph node, indeterminate. Attention at follow-up recommended. CT VENOGRAM IMPRESSION: 1. No evidence for dural venous sinus thrombosis. 2. Evidence for patent but slow flow within the dominant right transverse and sigmoid sinuses, corresponding with the asymmetric FLAIR signal intensity seen on prior MRI. Electronically Signed   By: Jeannine Boga M.D.   On: 08/12/2021 02:12    Assessment and Plan:   1.  HFrEF with moderate right pleural effusion: -Cardiomyopathy uncertain etiology at this time -High-sensitivity troponin negative x2 with normal BNP -IM planning for right-sided thoracentesis today, await fluid/cytology results -Prior echo performed at outside hospital in 05/2021 demonstrated a preserved LVSF -Would start carvedilol 3.125 mg twice daily when able from a neurologic perspective in the setting of his CVA -Escalate GDMT as able -Will need ischemic evaluation as his acute illness improves and as he distances himself from his CVA  2.  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  History of syncope with associated PEA arrest: -Evaluated by cardiology at [facility] with episode felt to be related to anaphylaxis following cetuximab -Work-up at [facility] was unrevealing, with n further cardiac testing being indicated at that time  4.  HTN: -With CVA, some degree of permissive hypertension is needed -Escalate medical therapy as outlined above, when able  5.  HLD: -LDL 85 this admission -Lipitor has been titrated this admission   Remainder of care per primary service   For questions or updates, please  contact [place] Please consult [email or web address] for contact info under Cardiology/STEMI.   Signed, Christell Faith, PA-C [place] Pager: [phone] 08/13/2021, 10:02 AM

## 2021-08-13 NOTE — Consult Note (Signed)
Referring Physician:  No referring provider defined for this encounter.  Primary Physician:  Albina Billet, MD  Chief Complaint:  R arm weakness  History of Present Illness: 08/13/2021 Ivan Garcia. is a 65 y.o. male who presents with the chief complaint of R arm weakness.  He is admitted for aspiration pneumonia, but has also had a history of R arm weakness that started approximately a week ago.  He was on a new medication just prior to that.  He currently has no pain and limited sensory changes.  He underwent radiation to the area around his R brachial plexus this year.     Review of Systems:  A 10 point review of systems is negative, except for the pertinent positives and negatives detailed in the HPI.  Past Medical History: Past Medical History:  Diagnosis Date   Arthritis    Cancer ([place])    Head and neck cancer   Hemorrhoids    Hyperlipidemia    Hypertension     Past Surgical History: Past Surgical History:  Procedure Laterality Date   COLONOSCOPY WITH PROPOFOL N/A 04/04/2017   Procedure: COLONOSCOPY WITH PROPOFOL;  Surgeon: Robert Bellow, MD;  Location: [facility];  Service: Endoscopy;  Laterality: N/A;   IR GASTROSTOMY TUBE MOD SED  09/24/2020   MANDIBLE SURGERY     PORT-A-CATH REMOVAL  07/17/2018   PORTA CATH INSERTION N/A 12/19/2017   Procedure: PORTA CATH INSERTION;  Surgeon: Algernon Huxley, MD;  Location: [facility];  Service: Cardiovascular;  Laterality: N/A;   PORTA CATH INSERTION N/A 07/17/2018   Procedure: PORTA CATH INSERTION;  Surgeon: Algernon Huxley, MD;  Location: [facility];  Service: Cardiovascular;  Laterality: N/A;   PORTA CATH INSERTION N/A 09/18/2019   Procedure: PORTA CATH INSERTION;  Surgeon: Algernon Huxley, MD;  Location: [facility];  Service: Cardiovascular;  Laterality: N/A;   TONSILLECTOMY      Allergies: Allergies as of 08/08/2021 - Review Complete 08/08/2021  Allergen Reaction Noted   Cetuximab  Anaphylaxis 05/04/2021    Medications:  Current Facility-Administered Medications:    0.9 %  sodium chloride infusion, , Intravenous, PRN, Shelly Coss, MD   ALPRAZolam Duanne Moron) tablet 0.25 mg, 0.25 mg, Per Tube, QHS PRN, Renda Rolls, RPH, 0.25 mg at 08/13/21 0012   aspirin chewable tablet 81 mg, 81 mg, Per Tube, Daily, Belue, Alver Sorrow, RPH   atorvastatin (LIPITOR) tablet 40 mg, 40 mg, Per Tube, Daily, Adhikari, Amrit, MD, 40 mg at 08/12/21 2102   Chlorhexidine Gluconate Cloth 2 % PADS 6 each, 6 each, Topical, Daily, Sharen Hones, MD, 6 each at 08/12/21 0906   enoxaparin (LOVENOX) injection 40 mg, 40 mg, Subcutaneous, Q24H, Sharen Hones, MD, 40 mg at 08/12/21 1611   feeding supplement (KATE FARMS STANDARD 1.4) liquid 325 mL, 325 mL, Per Tube, 5 X Daily, Adhikari, Amrit, MD, 325 mL at 08/13/21 0515   free water 120 mL, 120 mL, Per Tube, 5 X Daily, Adhikari, Amrit, MD, 120 mL at 08/13/21 0515   ibuprofen (ADVIL) 100 MG/5ML suspension 600 mg, 600 mg, Per Tube, Q6H PRN, Renda Rolls, RPH   insulin aspart (novoLOG) injection 0-9 Units, 0-9 Units, Subcutaneous, Q4H, Sharen Hones, MD, 1 Units at 08/13/21 0016   labetalol (NORMODYNE) injection 20 mg, 20 mg, Intravenous, Q3H PRN, Mansy, Jan A, MD   lactated ringers bolus 1,000 mL, 1,000 mL, Intravenous, Once **AND** [COMPLETED] lactated ringers bolus 1,000 mL, 1,000 mL,  Intravenous, Once, Stopped at 08/08/21 1333 **AND** [COMPLETED] lactated ringers bolus 1,000 mL, 1,000 mL, Intravenous, Once, Nena Polio, MD, Stopped at 08/08/21 1218   levothyroxine (SYNTHROID) tablet 50 mcg, 50 mcg, Per Tube, Q0600, Shelly Coss, MD, 50 mcg at 08/13/21 0515   metroNIDAZOLE (FLAGYL) IVPB 500 mg, 500 mg, Intravenous, Q8H, Sharen Hones, MD, Stopped at 08/13/21 0120   morphine 2 MG/ML injection 2 mg, 2 mg, Intravenous, Q4H PRN, Tawanna Solo, Amrit, MD   pantoprazole sodium (PROTONIX) 40 mg oral suspension 40 mg, 40 mg, Per Tube, Daily, Adhikari, Amrit, MD, 40  mg at 08/12/21 0955   polyethylene glycol (MIRALAX / GLYCOLAX) packet 17 g, 17 g, Per Tube, Daily, Adhikari, Amrit, MD, 17 g at 08/10/21 0928   traMADol (ULTRAM) tablet 50 mg, 50 mg, Per Tube, Q6H PRN, Shelly Coss, MD, 50 mg at 08/13/21 0012  Facility-Administered Medications Ordered in Other Encounters:    sodium chloride flush (NS) 0.9 % injection 10 mL, 10 mL, Intravenous, PRN, Earlie Server, MD   sodium chloride flush (NS) 0.9 % injection 10 mL, 10 mL, Intravenous, PRN, Earlie Server, MD, 10 mL at 10/13/20 1034   Social History: Social History   Tobacco Use   Smoking status: Never   Smokeless tobacco: Never  Vaping Use   Vaping Use: Never used  Substance Use Topics   Alcohol use: No   Drug use: No    Family Medical History: Family History  Problem Relation Age of Onset   Breast cancer Mother    Asthma Mother    Congestive Heart Failure Mother    Prostate cancer Father    Brain cancer Father    Bladder Cancer Father     Physical Examination: Vitals:   08/13/21 0438 08/13/21 0758  BP: (!) 139/93 (!) 147/106  Pulse: 99 (!) 108  Resp: 18 18  Temp: (!) 97.4 F (36.3 C) 97.8 F (36.6 C)  SpO2: 95% 97%     General: Patient is well developed, well nourished, calm, collected, and in no apparent distress.  Psychiatric: Patient is non-anxious.  Head:  Pupils equal, round, and reactive to light.  ENT:  Oral mucosa appears well hydrated.  Neck:   Supple.  Full range of motion.  Respiratory: Patient is breathing without any difficulty.  Extremities: No edema.  Vascular: Palpable pulses in dorsal pedal vessels.  Skin:   On exposed skin, there are no abnormal skin lesions.  NEUROLOGICAL:  General: In no acute distress.   Awake, alert, oriented to person, place, and time.  Pupils equal round and reactive to light.  Facial tone is symmetric.  Tongue protrusion is midline.    Strength: Side Biceps Triceps Deltoid Interossei Grip Wrist Ext. Wrist Flex.  R '1 1 2 4 4 '$ 4-  4  L '5 5 5 5 5 5 5   '$ Side Iliopsoas Quads Hamstring PF DF EHL  R '5 5 5 5 5 5  '$ L '5 5 5 5 5 5    '$ Bilateral upper and lower extremity sensation is intact to light touch. Reflexes are 2+ at the left biceps, triceps, and bilateral patella and achilles. Hoffman's is absent. R arm reflexes are absent.  Clonus is not present.  Toes are down-going.    Gait is untested.  Imaging: MRI C spine 08/12/2021 IMPRESSION: Redemonstrated small areas of enhancement at the anterior aspects of C5, C6, C7, and T1, which appear overall similar to the prior exam. No new foci of abnormal osseous enhancement. No evidence of  cord enhancement.     Electronically Signed   By: Merilyn Baba M.D.   On: 08/12/2021 17:19  MRI Chest 08/12/2021 IMPRESSION: 1. 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 2. Similar right paraesophageal nodal conglomerate at the thoracic inlet. Similar post treatment changes in the larynx and surrounding the right common carotid artery. 3. Unchanged moderate right pleural effusion.     Electronically Signed   By: Titus Dubin M.D.   On: 08/12/2021 20:15  I have personally reviewed the images and agree with the above interpretation.  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  Based on examination, he has involvement of multiple parts of the brachial plexus, with only distal function preserved in his hands (some C8 and T1 distribution preserved).  I do not think surgery is indicated at the moment. One could consider nerve transfers to improve function, but that may require use of intracostal transfers, which is a larger surgery than he may wish to consider.  Recovery  from that is 6-12 months, which may not make sense given his prognosis.  I defer to Dr. Baruch Gouty and Dr. Tasia Catchings regarding any additional oncologic treatments or any medications for post-radiation brachial plexitis.   Kirbi Farrugia K. Izora Ribas MD, [facility]

## 2021-08-13 NOTE — Consult Note (Signed)
[name] Denies any lesion present 1: 23 9 [address]  Hematology/Oncology Consult note [facility] [phone] [phone]  Patient Care Team: Albina Billet, MD as PCP - General (Internal Medicine) Albina Billet, MD (Internal Medicine) Bary Castilla Forest Gleason, MD (General Surgery) Noreene Filbert, MD as Referring Physician (Radiation Oncology) Earlie Server, MD as Consulting Physician (Oncology)   Name of the patient: Ivan Garcia  [id number]  March 06, 1956   Date of visit: 08/13/21 REASON FOR COSULTATION:  Head and neck cancer, right upper extremity weakness.   History of presenting illness-  65 y.o. male with PMH listed at below who presents to ER for evaluation for evaluation after fall due to weakness.   Patient was found to be hypoxic and is started on nasal cannula oxygen.  Her initial work-up also showed hyponatremia with sodium level of 126.  08/08/2021, CT chest angiogram shows no evidence of PE.  New extensive airspace consolidation and volume loss of the right lower lobe, middle lobe, left lower lobe with significant debris's in the right bronchus intermedius.  Left mainstem bronchus in the left lower lobe bronchus.  Findings are suggestive of bilateral aspiration pneumonia. Progression of metastatic disease in the right lung with enlarged superior right upper lobe and inferior right lower upper lobe nodule.  Increased prominence of left axillary lymph node.  08/08/2021, CT abdomen pelvis with contrast showed no acute findings in abdomen or pelvis.  Gastrostomy tube is present and in appropriate position. 08/08/2021 CT head without contrast showed a right supraorbital soft tissue swelling and hematoma without underlying fracture.   Right-sided weakness. Patient has had chronic right hand numbness/burning sensation/tingling since May 2022 and previously seen by neurology Dr. Mickeal Skinner and Dr. Melrose Nakayama.  He had a EMG and it was felt that his symptoms  were secondary to severe carpal tunnel versus median neuropathy at the right wrist.  He has tried nocturnal splinting, oral steroids, localized steroid injection which did not help his symptoms.  He has been referred [facility] Ortho[facility] team [facility]. Patient reports new onset of right hand weakness which developed few days prior to the presentation.  He is not able to raise his arm or manage his tube feedings. + Cough up mucus in the throat.  08/10/2021, MRI brain without contrast showed 2 subtle adjacent punctate foci of diffuse abnormality involving the subcortical aspect of the posterior right frontal region suspicious for tiny acute to early subacute ischemia or infarct.  No associated hemorrhage or mass-effect.  Asymmetric flair signal intensity involving the right transverse and sigmoid sinus.  The finding is favored to reflect slow/sluggish flow, possible thrombus is difficult to exclude.  08/12/2021, MRI cervical spine showed redemonstrated small area of enhancement at anterior aspect of C5/C6/C7/T1 which appear overall similar to the prior exam. 08/12/2021, MRI chest with and without contrast showed diffusely increased T2 signal and mild enhancement involving the right brachial plexus roots, trunks, divisions, cords, and branches-consistent with.  Right brachial plexitis.  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  He has progressed through multiple lines of treatments. He last received palliative radiation to the cervical lymphadenopathy/periesophageal mass, last received RT in Jan 2022.  Patient recently just started on treatment with Keytruda/lenvatinib for about a week.  Sister Ivan Garcia is at the bedside.   Review of Systems  Constitutional:  Positive for fatigue. Negative for chills, fever and unexpected weight change.  HENT:   Negative for hearing  loss and voice change.   Eyes:  Negative for eye  problems and icterus.  Respiratory:  Positive for cough and shortness of breath. Negative for chest tightness.   Cardiovascular:  Negative for chest pain and leg swelling.  Gastrointestinal:  Negative for abdominal distention and abdominal pain.  Endocrine: Negative for hot flashes.  Genitourinary:  Negative for difficulty urinating, dysuria and frequency.   Musculoskeletal:  Negative for arthralgias.  Skin:  Negative for itching and rash.  Neurological:  Positive for extremity weakness. Negative for light-headedness and numbness.  Hematological:  Negative for adenopathy. Does not bruise/bleed easily.  Psychiatric/Behavioral:  Negative for confusion.    Allergies  Allergen Reactions   Cetuximab Anaphylaxis    Patient Active Problem List   Diagnosis Date Noted   Aspiration pneumonia ([place]) 08/08/2021   Acute hypoxemic respiratory failure ([place]) 08/08/2021   Sepsis ([place]) 08/08/2021   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   Neuropathy due to chemotherapeutic drug ([place]) 05/27/2021   PEA (Pulseless electrical activity) ([place]) 05/27/2021   Examination of participant in clinical trial 05/03/2021   Chemotherapy-induced neuropathy ([place]) 07/14/2020   Bone pain 07/14/2020   Encounter for antineoplastic immunotherapy 04/27/2020   Stage 3a chronic kidney disease ([place]) 12/22/2019   Encounter for antineoplastic chemotherapy 10/06/2019   Neoplasm related pain 10/06/2019   Stage 3 chronic kidney disease ([place]) 03/24/2018   Edema 03/24/2018   Port-A-Cath in place 03/24/2018   ARF (acute renal failure) ([place]) 01/02/2018   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[place]) 12/14/2017   Goals of care, counseling/discussion 12/10/2017   Encounter for screening colonoscopy 02/28/2017     Past Medical History:  Diagnosis Date   Arthritis    Cancer ([place])    Head and neck cancer   Hemorrhoids    Hyperlipidemia    Hypertension      Past Surgical History:  Procedure Laterality Date    COLONOSCOPY WITH PROPOFOL N/A 04/04/2017   Procedure: COLONOSCOPY WITH PROPOFOL;  Surgeon: Robert Bellow, MD;  Location: [facility] ENDOSCOPY;  Service: Endoscopy;  Laterality: N/A;   IR GASTROSTOMY TUBE MOD SED  09/24/2020   MANDIBLE SURGERY     PORT-A-CATH REMOVAL  07/17/2018   PORTA CATH INSERTION N/A 12/19/2017   Procedure: PORTA CATH INSERTION;  Surgeon: Algernon Huxley, MD;  Location: [place] [facility];  Service: Cardiovascular;  Laterality: N/A;   PORTA CATH INSERTION N/A 07/17/2018   Procedure: PORTA CATH INSERTION;  Surgeon: Algernon Huxley, MD;  Location: [place] [facility];  Service: Cardiovascular;  Laterality: N/A;   PORTA CATH INSERTION N/A 09/18/2019   Procedure: PORTA CATH INSERTION;  Surgeon: Algernon Huxley, MD;  Location: [place] [facility];  Service: Cardiovascular;  Laterality: N/A;   TONSILLECTOMY      Social History   Socioeconomic History   Marital status: Single    Spouse name: Not on file   Number of children: Not on file   Years of education: Not on file   Highest education level: Not on file  Occupational History   Not on file  Tobacco Use   Smoking status: Never   Smokeless tobacco: Never  Vaping Use   Vaping Use: Never used  Substance and Sexual Activity   Alcohol use: No   Drug use: No   Sexual activity: Not on file  Other Topics Concern   Not on file  Social History Narrative   Not on file   Social Determinants of Health   Financial Resource Strain:  Not on file  Food Insecurity: Not on file  Transportation Needs: Not on file  Physical Activity: Not on file  Stress: Not on file  Social Connections: Not on file  Intimate Partner Violence: Not on file     Family History  Problem Relation Age of Onset   Breast cancer Mother    Asthma Mother    Congestive Heart Failure Mother    Prostate cancer Father    Brain cancer Father    Bladder Cancer Father      Current Facility-Administered Medications:    0.9 %  sodium chloride infusion, ,  Intravenous, PRN, Shelly Coss, MD, Last Rate: 10 mL/hr at 08/13/21 0845, 250 mL at 08/13/21 0845   ALPRAZolam (XANAX) tablet 0.25 mg, 0.25 mg, Per Tube, QHS PRN, Renda Rolls, RPH, 0.25 mg at 08/13/21 0012   aspirin chewable tablet 81 mg, 81 mg, Per Tube, Daily, Belue, Alver Sorrow, RPH   atorvastatin (LIPITOR) tablet 40 mg, 40 mg, Per Tube, Daily, Adhikari, Amrit, MD, 40 mg at 08/12/21 2102   Chlorhexidine Gluconate Cloth 2 % PADS 6 each, 6 each, Topical, Daily, Sharen Hones, MD, 6 each at 08/12/21 0906   enoxaparin (LOVENOX) injection 40 mg, 40 mg, Subcutaneous, Q24H, Sharen Hones, MD, 40 mg at 08/12/21 1611   feeding supplement (KATE FARMS STANDARD 1.4) liquid 325 mL, 325 mL, Per Tube, 5 X Daily, Adhikari, Amrit, MD, 325 mL at 08/13/21 0515   free water 120 mL, 120 mL, Per Tube, 5 X Daily, Adhikari, Amrit, MD, 120 mL at 08/13/21 0515   ibuprofen (ADVIL) 100 MG/5ML suspension 600 mg, 600 mg, Per Tube, Q6H PRN, Renda Rolls, RPH   insulin aspart (novoLOG) injection 0-9 Units, 0-9 Units, Subcutaneous, Q4H, Sharen Hones, MD, 1 Units at 08/13/21 0016   labetalol (NORMODYNE) injection 20 mg, 20 mg, Intravenous, Q3H PRN, Mansy, Jan A, MD   lactated ringers bolus 1,000 mL, 1,000 mL, Intravenous, Once **AND** [COMPLETED] lactated ringers bolus 1,000 mL, 1,000 mL, Intravenous, Once, Stopped at 08/08/21 1333 **AND** [COMPLETED] lactated ringers bolus 1,000 mL, 1,000 mL, Intravenous, Once, Nena Polio, MD, Stopped at 08/08/21 1218   levothyroxine (SYNTHROID) tablet 50 mcg, 50 mcg, Per Tube, Q0600, Shelly Coss, MD, 50 mcg at 08/13/21 0515   metroNIDAZOLE (FLAGYL) IVPB 500 mg, 500 mg, Intravenous, Q8H, Zhang, Danford Bad, MD, Last Rate: 100 mL/hr at 08/13/21 0848, 500 mg at 08/13/21 0848   morphine 2 MG/ML injection 2 mg, 2 mg, Intravenous, Q4H PRN, Adhikari, Amrit, MD   pantoprazole sodium (PROTONIX) 40 mg oral suspension 40 mg, 40 mg, Per Tube, Daily, Adhikari, Amrit, MD, 40 mg at 08/12/21 0955    polyethylene glycol (MIRALAX / GLYCOLAX) packet 17 g, 17 g, Per Tube, Daily, Adhikari, Amrit, MD, 17 g at 08/10/21 0928   traMADol (ULTRAM) tablet 50 mg, 50 mg, Per Tube, Q6H PRN, Shelly Coss, MD, 50 mg at 08/13/21 0012  Facility-Administered Medications Ordered in Other Encounters:    sodium chloride flush (NS) 0.9 % injection 10 mL, 10 mL, Intravenous, PRN, Earlie Server, MD   sodium chloride flush (NS) 0.9 % injection 10 mL, 10 mL, Intravenous, PRN, Earlie Server, MD, 10 mL at 10/13/20 1034   Physical exam:  Vitals:   08/12/21 2219 08/12/21 2350 08/13/21 0438 08/13/21 0758  BP: (!) 152/100 (!) 152/102 (!) 139/93 (!) 147/106  Pulse: (!) 105 (!) 105 99 (!) 108  Resp: '18 18 18 18  '$ Temp: (!) 97.5 F (36.4 C) (!) 97.4 F (36.3  C) (!) 97.4 F (36.3 C) 97.8 F (36.6 C)  TempSrc: Oral     SpO2: 94% 94% 95% 97%  Weight:      Height:       Physical Exam Constitutional:      General: He is not in acute distress.    Appearance: He is not diaphoretic.  HENT:     Head: Normocephalic and atraumatic.     Nose: Nose normal.     Mouth/Throat:     Pharynx: No oropharyngeal exudate.  Eyes:     General: No scleral icterus.    Pupils: Pupils are equal, round, and reactive to light.  Cardiovascular:     Rate and Rhythm: Normal rate and regular rhythm.     Heart sounds: No murmur heard. Pulmonary:     Effort: Pulmonary effort is normal. No respiratory distress.     Comments: Nasal cannula oxygen.  Decreased breath sound bilaterally, right worse than left. Abdominal:     General: There is no distension.     Palpations: Abdomen is soft.     Tenderness: There is no abdominal tenderness.     Comments: + PEG tube  Musculoskeletal:     Cervical back: Normal range of motion and neck supple.     Right lower leg: No edema.     Left lower leg: No edema.  Skin:    General: Skin is warm and dry.     Findings: No erythema.  Neurological:     Mental Status: He is alert and oriented to person, place,  and time.     Motor: No abnormal muscle tone.     Comments: Decreased right upper extremity strength,2+ Normal bilateral lower extremity strength and left upper extremity strength.  Psychiatric:        Mood and Affect: Mood and affect normal.        CMP Latest Ref Rng & Units 08/13/2021  Glucose 70 - 99 mg/dL 114(H)  BUN 8 - 23 mg/dL 18  Creatinine 0.61 - 1.24 mg/dL 0.75  Sodium 135 - 145 mmol/L 129(L)  Potassium 3.5 - 5.1 mmol/L 3.9  Chloride 98 - 111 mmol/L 95(L)  CO2 22 - 32 mmol/L 29  Calcium 8.9 - 10.3 mg/dL 8.4(L)  Total Protein 6.5 - 8.1 g/dL -  Total Bilirubin 0.3 - 1.2 mg/dL -  Alkaline Phos 38 - 126 U/L -  AST 15 - 41 U/L -  ALT 0 - 44 U/L -   CBC Latest Ref Rng & Units 08/12/2021  WBC 4.0 - 10.5 K/uL 10.2  Hemoglobin 13.0 - 17.0 g/dL 14.4  Hematocrit 39.0 - 52.0 % 41.4  Platelets 150 - 400 K/uL 335    RADIOGRAPHIC STUDIES: I have personally reviewed the radiological images as listed and agreed with the findings in the report. CT ANGIO HEAD NECK W WO CM  Result Date: 08/12/2021 CLINICAL DATA:  Follow-up examination for acute stroke, possible dural sinus thrombosis. EXAM: CT ANGIOGRAPHY HEAD AND NECK CT VENOGRAM HEAD TECHNIQUE: Multidetector CT imaging of the head and neck was performed using the standard protocol during bolus administration of intravenous contrast. Multiplanar CT image reconstructions and MIPs were obtained to evaluate the vascular anatomy. Carotid stenosis measurements (when applicable) are obtained utilizing NASCET criteria, using the distal internal carotid diameter as the denominator. Dedicated CT imaging of the dural venous sinuses using the standard protocol following the administration of IV contrast. CONTRAST:  76m OMNIPAQUE IOHEXOL 350 MG/ML SOLN COMPARISON:  Comparison made with previous MRI  from 08/10/2021. FINDINGS: CT HEAD FINDINGS Brain: Cerebral volume within normal limits. Previously identified punctate right frontal infarcts not visible by  CT. No other acute large vessel territory infarct. No intracranial hemorrhage. No mass lesion, mass effect or midline shift. No hydrocephalus or extra-axial fluid collection. Vascular: No hyperdense vessel. Skull: Scalp soft tissues and calvarium within normal limits. Sinuses: Scattered mucosal thickening within the ethmoidal air cells and maxillary sinuses. Trace chronic right mastoid effusion. Orbits: Globes and orbital soft tissues within normal limits. Review of the MIP images confirms the above findings CTA NECK FINDINGS Aortic arch: Visualized aortic arch normal in caliber with normal 3 vessel morphology. Minor for age atheromatous change along the undersurface of the aortic arch. No stenosis about the origin of the great vessels. Right carotid system: Right CCA widely patent without stenosis. Ill-defined soft tissue density partially surrounding the proximal and mid right CCA consistent with post treatment changes. Mild for age atheromatous change about the right carotid bulb and proximal right ICA without significant stenosis. Right ICA widely patent distally without stenosis dissection or occlusion. Left carotid system: Left CCA patent from its origin to the bifurcation without stenosis. Mild for age eccentric mixed plaque at the proximal left ICA without significant stenosis. Left ICA patent distally without stenosis, dissection or occlusion. Vertebral arteries: Both vertebral arteries arise from the subclavian arteries. No proximal subclavian artery stenosis. Similarly, ill-defined soft tissue partially surrounding the proximal right vertebral artery consistent with post treatment changes. Vertebral arteries widely patent without stenosis, dissection or occlusion. Skeleton: No visible acute osseous finding. No discrete or worrisome osseous lesions. Torus mandibularis noted. Other neck: Post treatment changes seen within the mid and lower right neck. Mucosal edema within the right greater than left glottic  and supraglottic larynx likely reflects post radiation changes. Layering secretions noted within the hypopharynx. Scattered soft tissue stranding within the right anterolateral neck and retropharyngeal space also consistent with post radiation changes. Patient's known ill-defined nodal conglomerate just to the right of the esophagus at the thoracic inlet again seen, somewhat difficult to measure, but grossly similar from previous. Possible invasion of the adjacent esophagus which demonstrates irregular ill-defined wall thickening again noted. Changes are relatively similar to prior neck CT from 04/01/2021. Upper chest: Right greater than left layering pleural effusions partially visualized. Associated atelectasis and/or consolidation at the partially visualized right posterior lung. 1.7 cm nodule present at the posterior right upper lobe, measuring slightly larger as compared to recent chest CT from 08/08/2021. Underlying emphysematous changes noted. 1.6 cm precarinal lymph node noted, indeterminate (series 9, image 1). Review of the MIP images confirms the above findings CTA HEAD FINDINGS Anterior circulation: Both internal carotid arteries widely patent to the termini without stenosis. 6 mm focal outpouching arising from the cavernous left ICA consistent with aneurysm (series 11, image 252). This projects laterally. A1 segments patent bilaterally. Normal anterior communicating artery complex. Anterior cerebral arteries patent to their distal aspects without stenosis. No M1 stenosis or occlusion. Normal MCA bifurcations. Distal MCA branches well perfused and symmetric. Posterior circulation: Both V4 segments patent to the vertebrobasilar junction without stenosis. Both PICA origins patent and normal. Basilar widely patent to its distal aspect without stenosis. Superior cerebellar arteries patent bilaterally. Both PCAs primarily supplied via the basilar and are well perfused to there distal aspects. Venous sinuses:  Additional delayed venous phase images were performed. Normal enhancement seen throughout the superior sagittal sinus to the level of the torcula. Left transverse and sigmoid sinus is patent as is  the visualized left internal jugular vein. Dominant right transverse and sigmoid sinuses demonstrate heterogeneous early enhancement, but are seen to fill and opacify with additional delayed images, consistent with slow but patent flow. Straight sinus, vein of Galen, internal cerebral veins, and basal veins of Rosenthal are patent. Cavernous sinus appears patent without abnormality. Superior orbital veins grossly symmetric and normal. No appreciable cortical vein thrombosis. Anatomic variants: None significant. Review of the MIP images confirms the above findings IMPRESSION: CT HEAD IMPRESSION: Stable head CT. 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 No other acute intracranial abnormality. CTA HEAD AND NECK IMPRESSION: 1. Negative CTA for large vessel occlusion. Mild atheromatous disease for age, with no hemodynamically significant or correctable stenosis. 2. 6 mm cavernous left ICA aneurysm. 3. Post treatment changes within the lower neck with similar size and appearance of ill-defined right paraesophageal nodal mass. Possible invasion of the adjacent esophagus again noted. 4. Right greater than left layering pleural effusions with associated atelectasis and/or consolidation. 5. 1.7 cm right upper lobe nodule, consistent with metastatic disease. This measures slightly larger in size as compared to recent chest CT from 08/08/2021. 6. 1.6 cm precarinal lymph node, indeterminate. Attention at follow-up recommended. CT VENOGRAM IMPRESSION: 1. No evidence for dural venous sinus thrombosis. 2. Evidence for patent but slow flow within the dominant right transverse and sigmoid sinuses, corresponding with the asymmetric FLAIR signal intensity seen on prior MRI. Electronically Signed   By:  Jeannine Boga M.D.   On: 08/12/2021 02:12   DG Wrist 2 Views Right  Result Date: 08/10/2021 CLINICAL DATA:  Hand and wrist soreness and swelling EXAM: RIGHT WRIST - 2 VIEW; RIGHT HAND - 2 VIEW COMPARISON:  None. FINDINGS: Wrist: There is no acute fracture or dislocation. There is minimal positive ulnar variance. Alignment is otherwise normal. The joint spaces are preserved. The soft tissues are unremarkable. Hand: There is no acute fracture or dislocation. Alignment is normal. There is degenerative change at the thumb MCP joint. The joint spaces are otherwise preserved. The soft tissues are unremarkable. IMPRESSION: Mild degenerative change at the thumb MCP joint. Otherwise, unremarkable hand and wrist radiographs. Electronically Signed   By: Valetta Mole M.D.   On: 08/10/2021 13:33   CT HEAD WO CONTRAST (5MM)  Result Date: 08/08/2021 CLINICAL DATA:  Found on floor by family. Personal history of esophageal cancer. Feeding tube. Mental status change. Hypoxia. EXAM: CT HEAD WITHOUT CONTRAST CT CERVICAL SPINE WITHOUT CONTRAST TECHNIQUE: Multidetector CT imaging of the head and cervical spine was performed following the standard protocol without intravenous contrast. Multiplanar CT image reconstructions of the cervical spine were also generated. COMPARISON:  MRI had without and with contrast 04/10/2021 FINDINGS: CT HEAD FINDINGS Brain: No acute infarct, hemorrhage, or mass lesion is present. No significant white matter lesions are present. The ventricles are of normal size. No significant extraaxial fluid collection is present. The brainstem and cerebellum are within normal limits. Vascular: No hyperdense vessel or unexpected calcification. Skull: Right supraorbital soft tissue swelling and hematoma is present. No underlying fracture is present. Calvarium otherwise within normal limits. No other significant extracranial soft tissue injury is present. Sinuses/Orbits: Mild mucosal thickening is present in the  maxillary sinuses bilaterally. No air-fluid levels are present. The globes and orbits are within normal limits. CT CERVICAL SPINE FINDINGS Alignment: No significant listhesis is present. Cervical lordosis preserved. Skull base and vertebrae: Craniocervical junction is within normal limits. Vertebral body heights are normal. Narrow acute or healing fractures are  present. Soft tissues and spinal canal: No prevertebral fluid or swelling. No visible canal hematoma. Esophageal mass the thoracic inlet again noted. Edema along the right vocal cord improved. Right IJ Port-A-Cath in place. Disc levels: Uncovertebral spurring leads to moderate foraminal narrowing at C3-4 and C5-6, left greater than right. Upper chest: Lung apices are clear. IMPRESSION: 1. Right supraorbital soft tissue swelling and hematoma without underlying fracture. 2. Normal CT appearance of the brain. 3. No acute fracture or traumatic subluxation in the cervical spine. 4. Multilevel degenerative changes of the cervical spine as described. Electronically Signed   By: San Morelle M.D.   On: 08/08/2021 13:54   CT Angio Chest PE W and/or Wo Contrast  Result Date: 08/08/2021 CLINICAL DATA:  History metastatic or pharyngeal squamous carcinoma. Found on floor at home and hypoxic. Elevated white blood cell count, D-dimer and lipase. EXAM: CT ANGIOGRAPHY CHEST WITH CONTRAST TECHNIQUE: Multidetector CT imaging of the chest was performed using the standard protocol during bolus administration of intravenous contrast. Multiplanar CT image reconstructions and MIPs were obtained to evaluate the vascular anatomy. CONTRAST:  74m OMNIPAQUE IOHEXOL 350 MG/ML SOLN COMPARISON:  Prior CT of the chest on 04/01/2021 FINDINGS: Cardiovascular: The pulmonary arteries are adequately opacified. There is no evidence of pulmonary embolism. Central pulmonary arteries are normal in caliber. The thoracic aorta is normal in caliber. Stable heart size. Stable calcified  coronary artery plaque. 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 No evidence of mediastinal or hilar lymphadenopathy. Increased prominence of some adjacent superior left axillary lymph nodes with the largest measuring approximately 1 cm in short axis. Lungs/Pleura: New extensive airspace consolidation and volume loss of the right lower lobe and right middle lobe with visible occlusive debris in the bronchus intermedius and scattered small air bronchograms in consolidated lung. Visible nearly occlusive debris in the left mainstem bronchus and debris in the left lower lobe bronchus with near complete atelectasis of the left lower lobe. Focal airspace disease in the medial aspect of the right middle lobe and posterior left upper lobe. Pulmonary findings are consistent with bilateral pneumonia and highly suggestive of aspiration pneumonia given the extensive debris in the airways. No significant associated pleural fluid or evidence of pneumothorax. Known metastatic pulmonary nodule in the superior right upper lobe shows interval enlargement since the prior study in April. This previously measured 8 mm and now measures 13 x 13 mm. Metastatic mass in the inferior aspect of the right upper lobe previously measuring 1.2 x 1.3 cm shows enlargement and now measures approximately 2.8 x 2.9 cm. Previously noted right lower lobe pulmonary nodule is not visible on today study due to the extensive consolidation and volume loss of the right lower lobe. Musculoskeletal: No chest wall abnormality. No acute or significant osseous findings. Review of the MIP images confirms the above findings. IMPRESSION: 1. No evidence of pulmonary embolism. 2. New extensive airspace consolidation and volume loss of the right lower lobe, right middle lobe and left lower lobe with significant debris in the right bronchus  intermedius, left mainstem bronchus and left lower lobe bronchus. Additional airspace disease in the right middle lobe and left upper lobe. Findings are suggestive of bilateral aspiration pneumonia. 3. Progression of metastatic disease in the right lung with enlargement of superior right upper lobe and inferior right upper lobe nodules as noted above. The previously noted right lower lobe nodule cannot  be assessed as it is obscured by consolidation of the right lower lobe. 4. 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 5. Increased prominence of left axillary lymph nodes which only measure 1 cm in short axis. Electronically Signed   By: Aletta Edouard M.D.   On: 08/08/2021 14:11   CT Cervical Spine Wo Contrast  Result Date: 08/08/2021 CLINICAL DATA:  Found on floor by family. Personal history of esophageal cancer. Feeding tube. Mental status change. Hypoxia. EXAM: CT HEAD WITHOUT CONTRAST CT CERVICAL SPINE WITHOUT CONTRAST TECHNIQUE: Multidetector CT imaging of the head and cervical spine was performed following the standard protocol without intravenous contrast. Multiplanar CT image reconstructions of the cervical spine were also generated. COMPARISON:  MRI had without and with contrast 04/10/2021 FINDINGS: CT HEAD FINDINGS Brain: No acute infarct, hemorrhage, or mass lesion is present. No significant white matter lesions are present. The ventricles are of normal size. No significant extraaxial fluid collection is present. The brainstem and cerebellum are within normal limits. Vascular: No hyperdense vessel or unexpected calcification. Skull: Right supraorbital soft tissue swelling and hematoma is present. No underlying fracture is present. Calvarium otherwise within normal limits. No other significant extracranial soft tissue injury is present. Sinuses/Orbits: Mild mucosal thickening is present in the maxillary sinuses bilaterally. No air-fluid levels are present. The globes  and orbits are within normal limits. CT CERVICAL SPINE FINDINGS Alignment: No significant listhesis is present. Cervical lordosis preserved. Skull base and vertebrae: Craniocervical junction is within normal limits. Vertebral body heights are normal. Narrow acute or healing fractures are present. Soft tissues and spinal canal: No prevertebral fluid or swelling. No visible canal hematoma. Esophageal mass the thoracic inlet again noted. Edema along the right vocal cord improved. Right IJ Port-A-Cath in place. Disc levels: Uncovertebral spurring leads to moderate foraminal narrowing at C3-4 and C5-6, left greater than right. Upper chest: Lung apices are clear. IMPRESSION: 1. Right supraorbital soft tissue swelling and hematoma without underlying fracture. 2. Normal CT appearance of the brain. 3. No acute fracture or traumatic subluxation in the cervical spine. 4. Multilevel degenerative changes of the cervical spine as described. Electronically Signed   By: San Morelle M.D.   On: 08/08/2021 13:54   MR BRAIN WO CONTRAST  Result Date: 08/11/2021 CLINICAL DATA:  Initial evaluation for neuro deficit, stroke suspected, cervical radiculopathy. EXAM: MRI HEAD WITHOUT CONTRAST TECHNIQUE: Multiplanar, multiecho pulse sequences of the brain and surrounding structures were obtained without intravenous contrast. COMPARISON:  Prior study from 08/08/2021. FINDINGS: MRI HEAD FINDINGS Brain: Cerebral volume within normal limits. No significant cerebral white matter disease for age. There are 2 subtle adjacent punctate foci of diffusion abnormality involving the subcortical aspect of the posterior right frontal region (series 5, images 36, 35), suspicious for tiny acute to early subacute ischemic infarcts. No associated hemorrhage or mass effect. No other diffusion abnormality to suggest acute or subacute ischemia. Gray-white matter differentiation otherwise maintained. No encephalomalacia to suggest chronic cortical  infarction elsewhere within the brain. No other evidence for acute or chronic intracranial hemorrhage. No mass lesion, midline shift or mass effect. No hydrocephalus or extra-axial fluid collection. Pituitary gland and suprasellar region within normal limits. Midline structures intact. Vascular: There is asymmetric FLAIR signal with intermediate T1 signal intensity involving the right transverse and sigmoid sinus (series 15, image 14). While this finding is favored to reflect slow/sluggish flow, possible thrombus is difficult to exclude. Major intracranial vascular flow voids are otherwise maintained. Skull and upper cervical  spine: Craniocervical junction within normal limits. Bone marrow signal intensity normal. No focal marrow replacing lesion. No scalp soft tissue abnormality. Sinuses/Orbits: Globes and orbital soft tissues within normal limits. Mild scattered mucosal thickening noted within the ethmoidal air cells and maxillary sinuses. Paranasal sinuses are otherwise clear. Small right mastoid effusion noted, of doubtful significance. Inner ear structures grossly normal. Other: None. IMPRESSION: 1. Two subtle adjacent punctate foci of diffusion abnormality involving the subcortical aspect of the posterior right frontal region, suspicious for tiny acute to early subacute ischemic infarcts. No associated hemorrhage or mass effect. 2. Asymmetric FLAIR signal intensity involving the right transverse and sigmoid sinus. While this finding is favored to reflect slow/sluggish flow, possible thrombus is difficult to exclude. Further assessment with dedicated MRV, with and without contrast, suggested for further evaluation. 3. Otherwise normal brain MRI for age. Electronically Signed   By: Jeannine Boga M.D.   On: 08/11/2021 00:53   MR CHEST W WO CONTRAST  Result Date: 08/12/2021 CLINICAL DATA:  Progressive right upper extremity weakness. 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 EXAM:  MR CHEST WITH AND WITHOUT CONTRAST TECHNIQUE: Multiplanar, multisequence MR imaging of the right brachial plexus was performed before and after the administration of intravenous contrast. CONTRAST:  7.100m GADAVIST GADOBUTROL 1 MMOL/ML IV SOLN COMPARISON:  CTA head and neck from yesterday. FINDINGS: Spinal cord Normal caliber and signal of visualized spinal cord. Brachial plexus There is diffusely increased T2 signal and mild enhancement involving the right brachial plexus roots, trunks, divisions, cords, and branches. Muscles and tendons Incompletely evaluated mild edema involving the right rotator cuff muscles. Bones Please see separate MRI cervical spine reports from today and yesterday. Other findings Unchanged moderate right pleural effusion. Similar right paraesophageal nodal conglomerate at the thoracic inlet. Edematous glottic and supraglottic larynx consistent with post radiation change. Similar layering secretions in the hypopharynx. Non-enhancing soft tissue thickening surrounding the right common carotid artery, also consistent with post treatment change. IMPRESSION: 1. 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 2. Similar right paraesophageal nodal conglomerate at the thoracic inlet. Similar post treatment changes in the larynx and surrounding the right common carotid artery. 3. Unchanged moderate right pleural effusion. Electronically Signed   By: WTitus DubinM.D.   On: 08/12/2021 20:15   MR CERVICAL SPINE WO CONTRAST  Result Date: 08/11/2021 CLINICAL DATA:  Initial evaluation for cervical radiculopathy. History of oropharyngeal squamous cell carcinoma. EXAM: MRI CERVICAL SPINE WITHOUT CONTRAST TECHNIQUE: Multiplanar, multisequence MR imaging of the cervical spine was performed. No intravenous contrast was administered. COMPARISON:  Prior CT of the neck from 01/05/2021. FINDINGS: Alignment: Straightening of the normal cervical lordosis. No listhesis. Vertebrae: Vertebral body  height maintained without acute or chronic fracture. Suspected postradiation changes with possible fatty marrow conversion within the visualized osseous structures. Mild marrow edema involving the anterior aspects of the C4 through C7 vertebral bodies could be post treatment related as well. No discrete or worrisome osseous lesions. Facet arthrosis with associated marrow edema present about the left C3-4 facet, which could contribute to neck pain. Cord: Normal signal and morphology. Posterior Fossa, vertebral arteries, paraspinal tissues: Craniocervical junction within normal limits. Irregular appearance of the visualized hypopharynx with layering retropharyngeal effusion and right paraesophageal nodal conglomerate, grossly similar to prior neck CT. Normal flow voids seen within the vertebral arteries bilaterally. Disc levels: C2-C3: Mild left eccentric disc bulge with left-sided uncinate spurring. Mild left-sided facet hypertrophy. No spinal stenosis. Foramina remain patent. C3-C4: Broad-based posterior disc osteophyte  complex flattens and partially effaces the ventral thecal sac, asymmetric to the left. Mild left-sided spinal stenosis without cord impingement. Superimposed moderate left-sided facet degeneration. Moderate left with mild right C4 foraminal narrowing. C4-C5: Mild disc bulge with uncovertebral spurring. Mild left greater than right facet hypertrophy. No spinal stenosis. Mild bilateral C5 foraminal narrowing. C5-C6: Broad-based disc osteophyte complex flattens and partially effaces the ventral thecal sac, asymmetric to the right. Mild spinal stenosis with mild flattening of the ventral cord, but no cord signal changes. Severe right worse than left C6 foraminal narrowing. C6-C7: Minimal disc bulge with bilateral uncovertebral spurring. No spinal stenosis. Superimposed mild left-sided facet hypertrophy. Mild bilateral C7 foraminal narrowing. C7-T1: Negative interspace. Mild left-sided facet hypertrophy.  No stenosis. Visualized upper thoracic spine demonstrates no significant finding. IMPRESSION: 1. Degenerative disc osteophyte at C5-6 with resultant mild spinal stenosis, with severe bilateral C6 foraminal narrowing. 2. Left eccentric disc osteophyte and facet arthrosis at C3-4 with resultant mild left-sided spinal stenosis, with moderate left C4 foraminal narrowing. 3. More mild multilevel spondylosis elsewhere within the cervical spine as above. No other significant stenosis or neural impingement. 4. Post treatment changes within the visualized neck with associated right paraesophageal nodal conglomerate, partially visualized, but grossly similar to previous neck CT from 01/05/2021. Electronically Signed   By: Jeannine Boga M.D.   On: 08/11/2021 01:10   MR CERVICAL SPINE W CONTRAST  Result Date: 08/12/2021 CLINICAL DATA:  Head and neck cancer, known C-spine involvement EXAM: MRI CERVICAL SPINE WITH CONTRAST TECHNIQUE: Multiplanar, multisequence MR imaging of the cervical spine was performed following the administration of intravenous contrast. COMPARISON:  08/10/2021 MRI cervical spine without contrast, 05/31/2021 MRI cervical spine with and without contrast. Correlation is also made with CTA head neck 08/11/2021 FINDINGS: Alignment: Physiologic. Vertebrae: Diffuse fatty replacement of the bone marrow secondary to prior radiation in the neck. Redemonstrated small areas of enhancement at the anterior aspect of C5, C6, C7, and T1, which appear overall similar to the prior exam. No acute fracture. Cord: No abnormal enhancement. Posterior Fossa, vertebral arteries, paraspinal tissues: Mucosal thickening and enhancement in the esophagus, with a nonenhancing fluid collection at the right aspect. Posterior fossa is unremarkable. Disc levels: Please see previous MRI of the cervical spine 08/10/2021. IMPRESSION: Redemonstrated small areas of enhancement at the anterior aspects of C5, C6, C7, and T1, which appear  overall similar to the prior exam. No new foci of abnormal osseous enhancement. No evidence of cord enhancement. Electronically Signed   By: Merilyn Baba M.D.   On: 08/12/2021 17:19   CT ABDOMEN PELVIS W CONTRAST  Result Date: 08/08/2021 CLINICAL DATA:  History of metastatic oropharyngeal squamous carcinoma. Found on floor at home and hypoxic. Elevated white blood cell count, D-dimer and lipase. EXAM: CT ABDOMEN AND PELVIS WITH CONTRAST TECHNIQUE: Multidetector CT imaging of the abdomen and pelvis was performed using the standard protocol following bolus administration of intravenous contrast. CONTRAST:  61m OMNIPAQUE IOHEXOL 350 MG/ML SOLN COMPARISON:  CT of the abdomen without contrast on 09/15/2020 and PET scan on 11/09/2020 FINDINGS: Hepatobiliary: Stable left lobe hepatic cyst. Gallbladder and bile ducts are unremarkable. Pancreas: Unremarkable. No pancreatic ductal dilatation or surrounding inflammatory changes. Spleen: Normal in size without focal abnormality. Adrenals/Urinary Tract: Stable right renal cyst. No hydronephrosis or calculi. No solid renal masses or adrenal masses. The bladder is unremarkable. Stomach/Bowel: Gastrostomy tube present with normal positioning within the lumen of the stomach. No evidence of bowel obstruction, ileus or inflammation. No mass lesions are seen  involving the bowel. Diverticulosis of the descending and sigmoid colon without evidence of diverticulitis. No free intraperitoneal air. Vascular/Lymphatic: No significant vascular findings are present. No enlarged abdominal or pelvic lymph nodes. Reproductive: Prostate is unremarkable. Other: No abdominal wall hernia or abnormality. No abdominopelvic ascites. Musculoskeletal: No acute or significant osseous findings. IMPRESSION: No acute findings in the abdomen or pelvis. Gastrostomy tube is present and in appropriate position. Electronically Signed   By: Aletta Edouard M.D.   On: 08/08/2021 14:22   DG Hand 2 View  Right  Result Date: 08/10/2021 CLINICAL DATA:  Hand and wrist soreness and swelling EXAM: RIGHT WRIST - 2 VIEW; RIGHT HAND - 2 VIEW COMPARISON:  None. FINDINGS: Wrist: There is no acute fracture or dislocation. There is minimal positive ulnar variance. Alignment is otherwise normal. The joint spaces are preserved. The soft tissues are unremarkable. Hand: There is no acute fracture or dislocation. Alignment is normal. There is degenerative change at the thumb MCP joint. The joint spaces are otherwise preserved. The soft tissues are unremarkable. IMPRESSION: Mild degenerative change at the thumb MCP joint. Otherwise, unremarkable hand and wrist radiographs. Electronically Signed   By: Valetta Mole M.D.   On: 08/10/2021 13:33   DG Chest Portable 1 View  Result Date: 08/08/2021 CLINICAL DATA:  Low O2 saturation EXAM: PORTABLE CHEST 1 VIEW COMPARISON:  None FINDINGS: There is a chest port with catheter tip overlying the right atrium. Unchanged, enlarged cardiac silhouette. There are low lung volumes with bibasilar atelectasis and possible small effusions. Nodular opacities in the right lung consistent with known pulmonary nodules. No new focal airspace disease. No visible pneumothorax. Bilateral shoulder degenerative changes. No acute osseous abnormality. Thoracic spondylosis. IMPRESSION: Low lung volumes with bibasilar subsegmental atelectasis and probable small bilateral pleural effusions. Electronically Signed   By: Maurine Simmering M.D.   On: 08/08/2021 09:54   ECHOCARDIOGRAM COMPLETE  Result Date: 08/12/2021    ECHOCARDIOGRAM REPORT   Patient Name:   [facility]. Date of Exam: 08/12/2021 Medical Rec #:  [id number]              Height:       70.0 in Accession #:    [id number]             Weight:       189.2 lb Date of Birth:  [birth year]              BSA:          2.038 m Patient Age:    3 years               BP:           148/100 mmHg Patient Gender: M                      HR:           102 bpm. Exam  Location:  [facility] Procedure: 2D Echo, Color Doppler and Cardiac Doppler Indications:     I63.9 Stroke  History:         Patient has no prior history of Echocardiogram examinations.                  Risk Factors:Hypertension and Dyslipidemia.  Sonographer:     Charmayne Sheer Referring Phys:  [id number] AMRIT ADHIKARI Diagnosing Phys: Yolonda Kida MD  Sonographer Comments: Technically challenging study due to limited acoustic windows. Image acquisition challenging due to patient body habitus and  Image acquisition challenging due to respiratory motion. IMPRESSIONS  1. Left ventricular ejection fraction, by estimation, is 40 to 45%. The left ventricle has mildly decreased function. The left ventricle demonstrates global hypokinesis. Left ventricular diastolic parameters are consistent with Grade II diastolic dysfunction (pseudonormalization).  2. Right ventricular systolic function is normal. The right ventricular size is moderately enlarged.  3. The mitral valve is normal in structure. Trivial mitral valve regurgitation.  4. The aortic valve is grossly normal. Aortic valve regurgitation is not visualized. FINDINGS  Left Ventricle: Left ventricular ejection fraction, by estimation, is 40 to 45%. The left ventricle has mildly decreased function. The left ventricle demonstrates global hypokinesis. The left ventricular internal cavity size was normal in size. There is  no left ventricular hypertrophy. Left ventricular diastolic parameters are consistent with Grade II diastolic dysfunction (pseudonormalization). Right Ventricle: The right ventricular size is moderately enlarged. No increase in right ventricular wall thickness. Right ventricular systolic function is normal. Left Atrium: Left atrial size was normal in size. Right Atrium: Right atrial size was normal in size. Pericardium: There is no evidence of pericardial effusion. Mitral Valve: The mitral valve is normal in structure. Trivial mitral valve regurgitation. MV peak  gradient, 3.9 mmHg. The mean mitral valve gradient is 2.0 mmHg. Tricuspid Valve: The tricuspid valve is grossly normal. Tricuspid valve regurgitation is mild. Aortic Valve: The aortic valve is grossly normal. Aortic valve regurgitation is not visualized. Aortic valve mean gradient measures 2.0 mmHg. Aortic valve peak gradient measures 3.7 mmHg. Aortic valve area, by VTI measures 3.38 cm. Pulmonic Valve: The pulmonic valve was grossly normal. Pulmonic valve regurgitation is not visualized. Aorta: The ascending aorta was not well visualized. IAS/Shunts: No atrial level shunt detected by color flow Doppler.  LEFT VENTRICLE PLAX 2D LVIDd:         4.30 cm  Diastology LVIDs:         3.50 cm  LV e' medial:    5.22 cm/s LV PW:         1.00 cm  LV E/e' medial:  9.8 LV IVS:        0.70 cm  LV e' lateral:   7.72 cm/s LVOT diam:     2.20 cm  LV E/e' lateral: 6.7 LV SV:         52 LV SV Index:   25 LVOT Area:     3.80 cm  LEFT ATRIUM           Index LA diam:      3.10 cm 1.52 cm/m LA Vol (A4C): 25.8 ml 12.66 ml/m  AORTIC VALVE                   PULMONIC VALVE AV Area (Vmax):    3.35 cm    PV Vmax:       0.82 m/s AV Area (Vmean):   3.45 cm    PV Vmean:      56.400 cm/s AV Area (VTI):     3.38 cm    PV VTI:        0.101 m AV Vmax:           96.30 cm/s  PV Peak grad:  2.7 mmHg AV Vmean:          65.700 cm/s PV Mean grad:  1.0 mmHg AV VTI:            0.153 m AV Peak Grad:      3.7 mmHg AV Mean Grad:  2.0 mmHg LVOT Vmax:         84.90 cm/s LVOT Vmean:        59.700 cm/s LVOT VTI:          0.136 m LVOT/AV VTI ratio: 0.89  AORTA Ao Root diam: 3.70 cm MITRAL VALVE MV Area (PHT): 9.37 cm    SHUNTS MV Area VTI:   3.83 cm    Systemic VTI:  0.14 m MV Peak grad:  3.9 mmHg    Systemic Diam: 2.20 cm MV Mean grad:  2.0 mmHg MV Vmax:       0.99 m/s MV Vmean:      58.9 cm/s MV Decel Time: 81 msec MV E velocity: 51.40 cm/s MV A velocity: 75.80 cm/s MV E/A ratio:  0.68 Dwayne D Callwood MD Electronically signed by Yolonda Kida MD  Signature Date/Time: 08/12/2021/4:11:53 PM    Final    CT VENOGRAM HEAD  Result Date: 08/12/2021 CLINICAL DATA:  Follow-up examination for acute stroke, possible dural sinus thrombosis. EXAM: CT ANGIOGRAPHY HEAD AND NECK CT VENOGRAM HEAD TECHNIQUE: Multidetector CT imaging of the head and neck was performed using the standard protocol during bolus administration of intravenous contrast. Multiplanar CT image reconstructions and MIPs were obtained to evaluate the vascular anatomy. Carotid stenosis measurements (when applicable) are obtained utilizing NASCET criteria, using the distal internal carotid diameter as the denominator. Dedicated CT imaging of the dural venous sinuses using the standard protocol following the administration of IV contrast. CONTRAST:  82m OMNIPAQUE IOHEXOL 350 MG/ML SOLN COMPARISON:  Comparison made with previous MRI from 08/10/2021. FINDINGS: CT HEAD FINDINGS Brain: Cerebral volume within normal limits. Previously identified punctate right frontal infarcts not visible by CT. No other acute large vessel territory infarct. No intracranial hemorrhage. No mass lesion, mass effect or midline shift. No hydrocephalus or extra-axial fluid collection. Vascular: No hyperdense vessel. Skull: Scalp soft tissues and calvarium within normal limits. Sinuses: Scattered mucosal thickening within the ethmoidal air cells and maxillary sinuses. Trace chronic right mastoid effusion. Orbits: Globes and orbital soft tissues within normal limits. Review of the MIP images confirms the above findings CTA NECK FINDINGS Aortic arch: Visualized aortic arch normal in caliber with normal 3 vessel morphology. Minor for age atheromatous change along the undersurface of the aortic arch. No stenosis about the origin of the great vessels. Right carotid system: Right CCA widely patent without stenosis. Ill-defined soft tissue density partially surrounding the proximal and mid right CCA consistent with post treatment changes.  Mild for age atheromatous change about the right carotid bulb and proximal right ICA without significant stenosis. Right ICA widely patent distally without stenosis dissection or occlusion. Left carotid system: Left CCA patent from its origin to the bifurcation without stenosis. Mild for age eccentric mixed plaque at the proximal left ICA without significant stenosis. Left ICA patent distally without stenosis, dissection or occlusion. Vertebral arteries: Both vertebral arteries arise from the subclavian arteries. No proximal subclavian artery stenosis. Similarly, ill-defined soft tissue partially surrounding the proximal right vertebral artery consistent with post treatment changes. Vertebral arteries widely patent without stenosis, dissection or occlusion. Skeleton: No visible acute osseous finding. No discrete or worrisome osseous lesions. Torus mandibularis noted. Other neck: Post treatment changes seen within the mid and lower right neck. Mucosal edema within the right greater than left glottic and supraglottic larynx likely reflects post radiation changes. Layering secretions noted within the hypopharynx. Scattered soft tissue stranding within the right anterolateral neck and retropharyngeal space also consistent with post radiation  changes. Patient's known ill-defined nodal conglomerate just to the right of the esophagus at the thoracic inlet again seen, somewhat difficult to measure, but grossly similar from previous. Possible invasion of the adjacent esophagus which demonstrates irregular ill-defined wall thickening again noted. Changes are relatively similar to prior neck CT from 04/01/2021. Upper chest: Right greater than left layering pleural effusions partially visualized. Associated atelectasis and/or consolidation at the partially visualized right posterior lung. 1.7 cm nodule present at the posterior right upper lobe, measuring slightly larger as compared to recent chest CT from 08/08/2021. Underlying  emphysematous changes noted. 1.6 cm precarinal lymph node noted, indeterminate (series 9, image 1). Review of the MIP images confirms the above findings CTA HEAD FINDINGS Anterior circulation: Both internal carotid arteries widely patent to the termini without stenosis. 6 mm focal outpouching arising from the cavernous left ICA consistent with aneurysm (series 11, image 252). This projects laterally. A1 segments patent bilaterally. Normal anterior communicating artery complex. Anterior cerebral arteries patent to their distal aspects without stenosis. No M1 stenosis or occlusion. Normal MCA bifurcations. Distal MCA branches well perfused and symmetric. Posterior circulation: Both V4 segments patent to the vertebrobasilar junction without stenosis. Both PICA origins patent and normal. Basilar widely patent to its distal aspect without stenosis. Superior cerebellar arteries patent bilaterally. Both PCAs primarily supplied via the basilar and are well perfused to there distal aspects. Venous sinuses: Additional delayed venous phase images were performed. Normal enhancement seen throughout the superior sagittal sinus to the level of the torcula. Left transverse and sigmoid sinus is patent as is the visualized left internal jugular vein. Dominant right transverse and sigmoid sinuses demonstrate heterogeneous early enhancement, but are seen to fill and opacify with additional delayed images, consistent with slow but patent flow. Straight sinus, vein of Galen, internal cerebral veins, and basal veins of Rosenthal are patent. Cavernous sinus appears patent without abnormality. Superior orbital veins grossly symmetric and normal. No appreciable cortical vein thrombosis. Anatomic variants: None significant. Review of the MIP images confirms the above findings IMPRESSION: CT HEAD IMPRESSION: Stable head CT. 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 No other acute intracranial  abnormality. CTA HEAD AND NECK IMPRESSION: 1. Negative CTA for large vessel occlusion. Mild atheromatous disease for age, with no hemodynamically significant or correctable stenosis. 2. 6 mm cavernous left ICA aneurysm. 3. Post treatment changes within the lower neck with similar size and appearance of ill-defined right paraesophageal nodal mass. Possible invasion of the adjacent esophagus again noted. 4. Right greater than left layering pleural effusions with associated atelectasis and/or consolidation. 5. 1.7 cm right upper lobe nodule, consistent with metastatic disease. This measures slightly larger in size as compared to recent chest CT from 08/08/2021. 6. 1.6 cm precarinal lymph node, indeterminate. Attention at follow-up recommended. CT VENOGRAM IMPRESSION: 1. No evidence for dural venous sinus thrombosis. 2. Evidence for patent but slow flow within the dominant right transverse and sigmoid sinuses, corresponding with the asymmetric FLAIR signal intensity seen on prior MRI. Electronically Signed   By: Jeannine Boga M.D.   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  Radiation induced vs malignancy infiltration.   # Aspiration pneumonia On IV antibiotics # Pleural effusion recommend diagnostic and therapeutic thoracentesis.   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  CT findings is consistent with disease progression comparing  to his previous CT done in April 2022. 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  He has just started on Keytruda lenvatinib recently-  He has had images done at outside facility    Thank you for allowing me to participate in the care of this patient.   Earlie Server, MD, PhD Hematology  Oncology [place] [facility] 08/12/2021

## 2021-08-13 NOTE — Progress Notes (Signed)
PROGRESS NOTE    [name].  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  On presentation he was found to be hypoxemic.  Patient has severe dysphagia and has been strictly n.p.o. and gets nutrition from the tube feed.  On presentation, he was tachycardic, blood pressure was stable, he required 5 L of oxygen for maintenance of saturation.  Lab work showed hyponatremia, elevated glucose, creatinine of 1.44.  COVID screen test negative.  Chest CT scan showed multilobar aspiration pneumonia.  Started on  antibiotics.  Orthopedics consulted for severe carpal tunnel syndrome of the right hand.  He was found to have new onset weakness of right upper extremity, MRI of the brain showed acute/subacute ischemic infarct on the right frontal lobe.  Neurology following. MRI cervical spine with contrast, MRI brachial plexus study pending.  Oncology, neurosurgery also consulted.  Cardiology also consulted for finding of low ejection fraction.Plan for right-sided thoracentesis today.  Assessment & Plan:   Active Problems:   Aspiration pneumonia (HCC)   Acute hypoxemic respiratory failure (HCC)   Sepsis (HCC)   Acute hypoxic respiratory failure secondary to multilobar aspiration pneumonia/severe sepsis: Met septic criteria with tachycardia, leukocytosis, lactic acidosis, AKI.  Required 5 L of oxygen for maintenance of saturation on admission. Started on broad-spectrum antibiotics with ceftriaxone and Flagyl .  He is currently on room air.  Right upper extremity weakness/acute ischemic stroke: He was found to have new onset weakness of right upper extremity .MRI of the brain  showed acute/subacute ischemic infarct on the right frontal lobe.  MRI of the cervical spine showed severe bilateral foraminal narrowing, degenerative changes, no significant spinal stenosis.Neurology consulted .  Started on aspirin 81 mg daily, Lipitor. Hemoglobin A1c of 6.  LDL of 85 CT angio head and neck was negative for large vessel occlusion,showed 6 mm cavernous left ICA aneurysm.  CT venogram did not show dural venous sinus thrombosis. 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  We suspected that right upper extremity weakness could be associated with progression of his oropharyngeal malignancy.  Oncology, neurosurgery consulted .  Neurosurgery did not recommend any surgery at the moment. Echo showed EF of 40 to 45%, global hypokinesis, grade 2 diastolic dysfunction.  Cardiology consulted.  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  Strictly n.p.o. at home. Follows with oncology as an outpatient.On lenvatinib and also on immunotherapy CT angio head and neck showed1.7 cm right upper lobe nodule, consistent with metastatic disease.  He also has a small right-sided pleural effusion.  We will request for right-sided ultrasound-guided thoracentesis.  AKI/hyponatremia: AKI has resolved, sodium at 129.  Continue to monitor  Fall: Has problem with ambulation.  Had bruise on the forehead after the fall.  CT scan did not show any intracranial hemorrhage. PT/OT recommended SNF  History of hypertension: On amlodipine at home.  Currently blood pressure stable  Hyperlipidemia: On Lipitor at home.  Hypothyroidism: On Synthyroid at home.  Diabetes type 2: Hyperglycemic in presentation.  Started on sliding scale insulin.  Monitor blood sugars.HbA1c  of 6.0  Right hand carpal tunnel  syndrome/chronic pain syndrome: Follows with orthopedics.  Recently had a steroid shot for carpal tunnel Syndrome.  Impaired mobility on the right hand with pain and swelling,now much better.  Continue pain medicines/supportive care.ortho consulted here.  Debility/deconditioning: Has profound right upper extremity weakness.  PT/OT recommending skilled nursing facility on discharge    Nutrition Problem: Increased nutrient needs Etiology: cancer and cancer related treatments      DVT prophylaxis:Lovenox Code Status: Full Family Communication: Discussed with the sister face-to-face on 08/13/2021 Status is: Inpatient  Remains inpatient appropriate because:Inpatient level of care appropriate due to severity of illness  Dispo: The patient is from: Home              Anticipated d/c is to: skilled nursing facility              Patient currently is not medically stable to d/c.   Difficult to place patient No     Consultants: None  Procedures:None  Antimicrobials:  Anti-infectives (From admission, onward)    Start     Dose/Rate Route Frequency Ordered Stop   08/08/21 1615  cefTRIAXone (ROCEPHIN) 2 g in sodium chloride 0.9 % 100 mL IVPB        2 g 200 mL/hr over 30 Minutes Intravenous Every 24 hours 08/08/21 1610 08/12/21 1659   08/08/21 1615  metroNIDAZOLE (FLAGYL) IVPB 500 mg        500 mg 100 mL/hr over 60 Minutes Intravenous Every 8 hours 08/08/21 1610     08/08/21 1045  ceFEPIme (MAXIPIME) 2 g in sodium chloride 0.9 % 100 mL IVPB        2 g 200 mL/hr over 30 Minutes Intravenous  Once 08/08/21 1039 08/08/21 1122   08/08/21 1045  metroNIDAZOLE (FLAGYL) IVPB 500 mg        500 mg 100 mL/hr over 60 Minutes Intravenous  Once 08/08/21 1039 08/08/21 1218   08/08/21 1045  vancomycin (VANCOCIN) IVPB 1000 mg/200 mL premix        1,000 mg 200 mL/hr over 60 Minutes Intravenous  Once 08/08/21 1039 08/08/21 1333       Subjective:  Patient seen and examined the bedside this morning.   Mild hypertensive today.  His overall status is the same like yesterday.  Sitter at bedside.  He says when he lies on the bed, he feels short of breath, his oxygenation has been good without oxygen supplementation.  Had a good sleep last night.  Right upper extremity still very weak.  Objective: Vitals:   08/12/21 2219 08/12/21 2350 08/13/21 0438 08/13/21 0758  BP: (!) 152/100 (!) 152/102 (!) 139/93 (!) 147/106  Pulse: (!) 105 (!) 105 99 (!) 108  Resp: '18 18 18 18  ' Temp: (!) 97.5 F (36.4 C) (!) 97.4 F (36.3 C) (!) 97.4 F (36.3 C) 97.8 F (36.6 C)  TempSrc: Oral     SpO2: 94% 94% 95% 97%  Weight:      Height:        Intake/Output Summary (Last 24 hours) at 08/13/2021 6979 Last data filed at 08/13/2021 4801 Gross per 24 hour  Intake --  Output 1401 ml  Net -1401 ml   Filed Weights   08/08/21 1728  Weight: 85.8 kg    Examination:  General exam: Overall comfortable, not in distress, pleasant male HEENT: PERRL Respiratory system:  no wheezes or crackles  Cardiovascular system: S1 & S2 heard, RRR.  Gastrointestinal system: Abdomen is nondistended,  soft and nontender. Central nervous system: Alert and oriented, weakness of right upper extremity with motor of 1/5 Extremities: No edema, no clubbing ,no cyanosis Skin: No rashes, no ulcers,no icterus     Data Reviewed: I have personally reviewed following labs and imaging studies  CBC: Recent Labs  Lab 08/08/21 0919 08/09/21 0610 08/10/21 0533 08/12/21 0642  WBC 19.8* 9.0 9.6 10.2  NEUTROABS 17.7* 8.0* 8.7* 9.1*  HGB 14.6 13.8 12.9* 14.4  HCT 42.6 39.8 37.3* 41.4  MCV 91.6 91.5 91.4 90.2  PLT 390 321 306 235   Basic Metabolic Panel: Recent Labs  Lab 08/08/21 1335 08/09/21 0610 08/10/21 0533 08/12/21 0642 08/13/21 0630  NA 126* 133* 133* 128* 129*  K 4.4 4.0 4.1 4.5 3.9  CL 95* 99 99 90* 95*  CO2 '24 29 27 28 29  ' GLUCOSE 272* 136* 98 108* 114*  BUN 28* 25* '21 16 18  ' CREATININE 1.44* 0.96 0.70 0.71 0.75   CALCIUM 8.0* 8.7* 8.1* 9.0 8.4*  MG  --  2.5*  --   --   --   PHOS  --  3.8  --   --   --    GFR: Estimated Creatinine Clearance: 93.8 mL/min (by C-G formula based on SCr of 0.75 mg/dL). Liver Function Tests: Recent Labs  Lab 08/08/21 0919  AST 48*  ALT 24  ALKPHOS 76  BILITOT 0.9  PROT 7.3  ALBUMIN 4.0   Recent Labs  Lab 08/08/21 0919  LIPASE 101*   No results for input(s): AMMONIA in the last 168 hours. Coagulation Profile: No results for input(s): INR, PROTIME in the last 168 hours. Cardiac Enzymes: Recent Labs  Lab 08/08/21 0919  CKTOTAL 1,739*   BNP (last 3 results) No results for input(s): PROBNP in the last 8760 hours. HbA1C: No results for input(s): HGBA1C in the last 72 hours.  CBG: Recent Labs  Lab 08/12/21 1256 08/12/21 1611 08/12/21 1955 08/12/21 2355 08/13/21 0433  GLUCAP 134* 93 107* 135* 89   Lipid Profile: Recent Labs    08/12/21 0642  CHOL 168  HDL 68  LDLCALC 85  TRIG 75  CHOLHDL 2.5   Thyroid Function Tests: No results for input(s): TSH, T4TOTAL, FREET4, T3FREE, THYROIDAB in the last 72 hours. Anemia Panel: No results for input(s): VITAMINB12, FOLATE, FERRITIN, TIBC, IRON, RETICCTPCT in the last 72 hours. Sepsis Labs: Recent Labs  Lab 08/08/21 0919 08/08/21 1109  LATICACIDVEN 2.5* 1.7    Recent Results (from the past 240 hour(s))  Culture, blood (single)     Status: None   Collection Time: 08/08/21 11:09 AM   Specimen: BLOOD  Result Value Ref Range Status   Specimen Description BLOOD RIGHT ANTECUBITAL  Final   Special Requests   Final    BOTTLES DRAWN AEROBIC AND ANAEROBIC Blood Culture adequate volume   Culture   Final    NO GROWTH 5 DAYS Performed at [facility], [address] [place], [place] [address]    Report Status 08/13/2021 FINAL  Final  Resp Panel by RT-PCR (Flu A&B, Covid)     Status: None   Collection Time: 08/08/21 11:13 AM   Specimen: Nasopharyngeal(NP) swabs in vial transport medium   Result Value Ref Range Status   SARS Coronavirus 2 by RT PCR NEGATIVE NEGATIVE Final    Comment: (NOTE) SARS-CoV-2 target nucleic acids are NOT DETECTED.  The SARS-CoV-2 RNA is generally detectable in upper respiratory specimens during the acute phase of infection. The lowest concentration of SARS-CoV-2  viral copies this assay can detect is 138 copies/mL. A negative result does not preclude SARS-Cov-2 infection and should not be used as the sole basis for treatment or other patient management decisions. A negative result may occur with  improper specimen collection/handling, submission of specimen other than nasopharyngeal swab, presence of viral mutation(s) within the areas targeted by this assay, and inadequate number of viral copies(<138 copies/mL). A negative result must be combined with clinical observations, patient history, and epidemiological information. The expected result is Negative.  Fact Sheet for Patients:  EntrepreneurPulse.com.au  Fact Sheet for Healthcare Providers:  IncredibleEmployment.be  This test is no t yet approved or cleared by the [place] FDA and  has been authorized for detection and/or diagnosis of SARS-CoV-2 by FDA under an Emergency Use Authorization (EUA). This EUA will remain  in effect (meaning this test can be used) for the duration of the COVID-19 declaration under Section 564(b)(1) of the Act, 21 U.S.C.section 360bbb-3(b)(1), unless the authorization is terminated  or revoked sooner.       Influenza A by PCR NEGATIVE NEGATIVE Final   Influenza B by PCR NEGATIVE NEGATIVE Final    Comment: (NOTE) The Xpert Xpress SARS-CoV-2/FLU/RSV plus assay is intended as an aid in the diagnosis of influenza from Nasopharyngeal swab specimens and should not be used as a sole basis for treatment. Nasal washings and aspirates are unacceptable for Xpert Xpress SARS-CoV-2/FLU/RSV testing.  Fact Sheet for  Patients: EntrepreneurPulse.com.au  Fact Sheet for Healthcare Providers: IncredibleEmployment.be  This test is not yet approved or cleared by the [place] FDA and has been authorized for detection and/or diagnosis of SARS-CoV-2 by FDA under an Emergency Use Authorization (EUA). This EUA will remain in effect (meaning this test can be used) for the duration of the COVID-19 declaration under Section 564(b)(1) of the Act, 21 U.S.C. section 360bbb-3(b)(1), unless the authorization is terminated or revoked.  Performed at [facility], [address] [place], [place] [address]   Culture, blood (routine x 2)     Status: None   Collection Time: 08/08/21 11:31 AM   Specimen: BLOOD  Result Value Ref Range Status   Specimen Description BLOOD  LAV  Final   Special Requests   Final    BOTTLES DRAWN AEROBIC AND ANAEROBIC Blood Culture adequate volume   Culture   Final    NO GROWTH 5 DAYS Performed at [facility], [address] [place],  [address]    Report Status 08/13/2021 FINAL  Final         Radiology Studies: CT ANGIO HEAD NECK W WO CM  Result Date: 08/12/2021 CLINICAL DATA:  Follow-up examination for acute stroke, possible dural sinus thrombosis. EXAM: CT ANGIOGRAPHY HEAD AND NECK CT VENOGRAM HEAD TECHNIQUE: Multidetector CT imaging of the head and neck was performed using the standard protocol during bolus administration of intravenous contrast. Multiplanar CT image reconstructions and MIPs were obtained to evaluate the vascular anatomy. Carotid stenosis measurements (when applicable) are obtained utilizing NASCET criteria, using the distal internal carotid diameter as the denominator. Dedicated CT imaging of the dural venous sinuses using the standard protocol following the administration of IV contrast. CONTRAST:  76m OMNIPAQUE IOHEXOL 350 MG/ML SOLN COMPARISON:  Comparison made with previous MRI from 08/10/2021.  FINDINGS: CT HEAD FINDINGS Brain: Cerebral volume within normal limits. Previously identified punctate right frontal infarcts not visible by CT. No other acute large vessel territory infarct. No intracranial hemorrhage. No mass lesion, mass effect or midline shift. No  hydrocephalus or extra-axial fluid collection. Vascular: No hyperdense vessel. Skull: Scalp soft tissues and calvarium within normal limits. Sinuses: Scattered mucosal thickening within the ethmoidal air cells and maxillary sinuses. Trace chronic right mastoid effusion. Orbits: Globes and orbital soft tissues within normal limits. Review of the MIP images confirms the above findings CTA NECK FINDINGS Aortic arch: Visualized aortic arch normal in caliber with normal 3 vessel morphology. Minor for age atheromatous change along the undersurface of the aortic arch. No stenosis about the origin of the great vessels. Right carotid system: Right CCA widely patent without stenosis. Ill-defined soft tissue density partially surrounding the proximal and mid right CCA consistent with post treatment changes. Mild for age atheromatous change about the right carotid bulb and proximal right ICA without significant stenosis. Right ICA widely patent distally without stenosis dissection or occlusion. Left carotid system: Left CCA patent from its origin to the bifurcation without stenosis. Mild for age eccentric mixed plaque at the proximal left ICA without significant stenosis. Left ICA patent distally without stenosis, dissection or occlusion. Vertebral arteries: Both vertebral arteries arise from the subclavian arteries. No proximal subclavian artery stenosis. Similarly, ill-defined soft tissue partially surrounding the proximal right vertebral artery consistent with post treatment changes. Vertebral arteries widely patent without stenosis, dissection or occlusion. Skeleton: No visible acute osseous finding. No discrete or worrisome osseous lesions. Torus mandibularis  noted. Other neck: Post treatment changes seen within the mid and lower right neck. Mucosal edema within the right greater than left glottic and supraglottic larynx likely reflects post radiation changes. Layering secretions noted within the hypopharynx. Scattered soft tissue stranding within the right anterolateral neck and retropharyngeal space also consistent with post radiation changes. Patient's known ill-defined nodal conglomerate just to the right of the esophagus at the thoracic inlet again seen, somewhat difficult to measure, but grossly similar from previous. Possible invasion of the adjacent esophagus which demonstrates irregular ill-defined wall thickening again noted. Changes are relatively similar to prior neck CT from 04/01/2021. Upper chest: Right greater than left layering pleural effusions partially visualized. Associated atelectasis and/or consolidation at the partially visualized right posterior lung. 1.7 cm nodule present at the posterior right upper lobe, measuring slightly larger as compared to recent chest CT from 08/08/2021. Underlying emphysematous changes noted. 1.6 cm precarinal lymph node noted, indeterminate (series 9, image 1). Review of the MIP images confirms the above findings CTA HEAD FINDINGS Anterior circulation: Both internal carotid arteries widely patent to the termini without stenosis. 6 mm focal outpouching arising from the cavernous left ICA consistent with aneurysm (series 11, image 252). This projects laterally. A1 segments patent bilaterally. Normal anterior communicating artery complex. Anterior cerebral arteries patent to their distal aspects without stenosis. No M1 stenosis or occlusion. Normal MCA bifurcations. Distal MCA branches well perfused and symmetric. Posterior circulation: Both V4 segments patent to the vertebrobasilar junction without stenosis. Both PICA origins patent and normal. Basilar widely patent to its distal aspect without stenosis. Superior  cerebellar arteries patent bilaterally. Both PCAs primarily supplied via the basilar and are well perfused to there distal aspects. Venous sinuses: Additional delayed venous phase images were performed. Normal enhancement seen throughout the superior sagittal sinus to the level of the torcula. Left transverse and sigmoid sinus is patent as is the visualized left internal jugular vein. Dominant right transverse and sigmoid sinuses demonstrate heterogeneous early enhancement, but are seen to fill and opacify with additional delayed images, consistent with slow but patent flow. Straight sinus, vein of Galen, internal cerebral veins, and  basal veins of Rosenthal are patent. Cavernous sinus appears patent without abnormality. Superior orbital veins grossly symmetric and normal. No appreciable cortical vein thrombosis. Anatomic variants: None significant. Review of the MIP images confirms the above findings IMPRESSION: CT HEAD IMPRESSION: Stable head CT. 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 No other acute intracranial abnormality. CTA HEAD AND NECK IMPRESSION: 1. Negative CTA for large vessel occlusion. Mild atheromatous disease for age, with no hemodynamically significant or correctable stenosis. 2. 6 mm cavernous left ICA aneurysm. 3. Post treatment changes within the lower neck with similar size and appearance of ill-defined right paraesophageal nodal mass. Possible invasion of the adjacent esophagus again noted. 4. Right greater than left layering pleural effusions with associated atelectasis and/or consolidation. 5. 1.7 cm right upper lobe nodule, consistent with metastatic disease. This measures slightly larger in size as compared to recent chest CT from 08/08/2021. 6. 1.6 cm precarinal lymph node, indeterminate. Attention at follow-up recommended. CT VENOGRAM IMPRESSION: 1. No evidence for dural venous sinus thrombosis. 2. Evidence for patent but slow flow within the  dominant right transverse and sigmoid sinuses, corresponding with the asymmetric FLAIR signal intensity seen on prior MRI. Electronically Signed   By: Jeannine Boga M.D.   On: 08/12/2021 02:12   MR CHEST W WO CONTRAST  Result Date: 08/12/2021 CLINICAL DATA:  Progressive right upper extremity weakness. 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 EXAM: MR CHEST WITH AND WITHOUT CONTRAST TECHNIQUE: Multiplanar, multisequence MR imaging of the right brachial plexus was performed before and after the administration of intravenous contrast. CONTRAST:  7.14m GADAVIST GADOBUTROL 1 MMOL/ML IV SOLN COMPARISON:  CTA head and neck from yesterday. FINDINGS: Spinal cord Normal caliber and signal of visualized spinal cord. Brachial plexus There is diffusely increased T2 signal and mild enhancement involving the right brachial plexus roots, trunks, divisions, cords, and branches. Muscles and tendons Incompletely evaluated mild edema involving the right rotator cuff muscles. Bones Please see separate MRI cervical spine reports from today and yesterday. Other findings Unchanged moderate right pleural effusion. Similar right paraesophageal nodal conglomerate at the thoracic inlet. Edematous glottic and supraglottic larynx consistent with post radiation change. Similar layering secretions in the hypopharynx. Non-enhancing soft tissue thickening surrounding the right common carotid artery, also consistent with post treatment change. IMPRESSION: 1. 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 2. Similar right paraesophageal nodal conglomerate at the thoracic inlet. Similar post treatment changes in the larynx and surrounding the right common carotid artery. 3. Unchanged moderate right pleural effusion. Electronically Signed   By: WTitus DubinM.D.   On: 08/12/2021 20:15   MR CERVICAL SPINE W CONTRAST  Result Date: 08/12/2021 CLINICAL DATA:  Head and neck cancer, known C-spine  involvement EXAM: MRI CERVICAL SPINE WITH CONTRAST TECHNIQUE: Multiplanar, multisequence MR imaging of the cervical spine was performed following the administration of intravenous contrast. COMPARISON:  08/10/2021 MRI cervical spine without contrast, 05/31/2021 MRI cervical spine with and without contrast. Correlation is also made with CTA head neck 08/11/2021 FINDINGS: Alignment: Physiologic. Vertebrae: Diffuse fatty replacement of the bone marrow secondary to prior radiation in the neck. Redemonstrated small areas of enhancement at the anterior aspect of C5, C6, C7, and T1, which appear overall similar to the prior exam. No acute fracture. Cord: No abnormal enhancement. Posterior Fossa, vertebral arteries, paraspinal tissues: Mucosal thickening and enhancement in the esophagus, with a nonenhancing fluid collection at the right aspect. Posterior fossa is unremarkable. Disc levels: Please see previous  MRI of the cervical spine 08/10/2021. IMPRESSION: Redemonstrated small areas of enhancement at the anterior aspects of C5, C6, C7, and T1, which appear overall similar to the prior exam. No new foci of abnormal osseous enhancement. No evidence of cord enhancement. Electronically Signed   By: Merilyn Baba M.D.   On: 08/12/2021 17:19   ECHOCARDIOGRAM COMPLETE  Result Date: 08/12/2021    ECHOCARDIOGRAM REPORT   Patient Name:   Merrik Puebla [facility]. Date of Exam: 08/12/2021 Medical Rec #:  [id number]              Height:       70.0 in Accession #:    [id number]             Weight:       189.2 lb Date of Birth:  [birth year]              BSA:          2.038 m Patient Age:    62 years               BP:           148/100 mmHg Patient Gender: M                      HR:           102 bpm. Exam Location:  [facility] Procedure: 2D Echo, Color Doppler and Cardiac Doppler Indications:     I63.9 Stroke  History:         Patient has no prior history of Echocardiogram examinations.                  Risk Factors:Hypertension and  Dyslipidemia.  Sonographer:     Charmayne Sheer Referring Phys:  [id number] Elijan Googe Diagnosing Phys: Yolonda Kida MD  Sonographer Comments: Technically challenging study due to limited acoustic windows. Image acquisition challenging due to patient body habitus and Image acquisition challenging due to respiratory motion. IMPRESSIONS  1. Left ventricular ejection fraction, by estimation, is 40 to 45%. The left ventricle has mildly decreased function. The left ventricle demonstrates global hypokinesis. Left ventricular diastolic parameters are consistent with Grade II diastolic dysfunction (pseudonormalization).  2. Right ventricular systolic function is normal. The right ventricular size is moderately enlarged.  3. The mitral valve is normal in structure. Trivial mitral valve regurgitation.  4. The aortic valve is grossly normal. Aortic valve regurgitation is not visualized. FINDINGS  Left Ventricle: Left ventricular ejection fraction, by estimation, is 40 to 45%. The left ventricle has mildly decreased function. The left ventricle demonstrates global hypokinesis. The left ventricular internal cavity size was normal in size. There is  no left ventricular hypertrophy. Left ventricular diastolic parameters are consistent with Grade II diastolic dysfunction (pseudonormalization). Right Ventricle: The right ventricular size is moderately enlarged. No increase in right ventricular wall thickness. Right ventricular systolic function is normal. Left Atrium: Left atrial size was normal in size. Right Atrium: Right atrial size was normal in size. Pericardium: There is no evidence of pericardial effusion. Mitral Valve: The mitral valve is normal in structure. Trivial mitral valve regurgitation. MV peak gradient, 3.9 mmHg. The mean mitral valve gradient is 2.0 mmHg. Tricuspid Valve: The tricuspid valve is grossly normal. Tricuspid valve regurgitation is mild. Aortic Valve: The aortic valve is grossly normal. Aortic valve  regurgitation is not visualized. Aortic valve mean gradient measures 2.0 mmHg. Aortic valve peak gradient measures 3.7 mmHg. Aortic valve area, by  VTI measures 3.38 cm. Pulmonic Valve: The pulmonic valve was grossly normal. Pulmonic valve regurgitation is not visualized. Aorta: The ascending aorta was not well visualized. IAS/Shunts: No atrial level shunt detected by color flow Doppler.  LEFT VENTRICLE PLAX 2D LVIDd:         4.30 cm  Diastology LVIDs:         3.50 cm  LV e' medial:    5.22 cm/s LV PW:         1.00 cm  LV E/e' medial:  9.8 LV IVS:        0.70 cm  LV e' lateral:   7.72 cm/s LVOT diam:     2.20 cm  LV E/e' lateral: 6.7 LV SV:         52 LV SV Index:   25 LVOT Area:     3.80 cm  LEFT ATRIUM           Index LA diam:      3.10 cm 1.52 cm/m LA Vol (A4C): 25.8 ml 12.66 ml/m  AORTIC VALVE                   PULMONIC VALVE AV Area (Vmax):    3.35 cm    PV Vmax:       0.82 m/s AV Area (Vmean):   3.45 cm    PV Vmean:      56.400 cm/s AV Area (VTI):     3.38 cm    PV VTI:        0.101 m AV Vmax:           96.30 cm/s  PV Peak grad:  2.7 mmHg AV Vmean:          65.700 cm/s PV Mean grad:  1.0 mmHg AV VTI:            0.153 m AV Peak Grad:      3.7 mmHg AV Mean Grad:      2.0 mmHg LVOT Vmax:         84.90 cm/s LVOT Vmean:        59.700 cm/s LVOT VTI:          0.136 m LVOT/AV VTI ratio: 0.89  AORTA Ao Root diam: 3.70 cm MITRAL VALVE MV Area (PHT): 9.37 cm    SHUNTS MV Area VTI:   3.83 cm    Systemic VTI:  0.14 m MV Peak grad:  3.9 mmHg    Systemic Diam: 2.20 cm MV Mean grad:  2.0 mmHg MV Vmax:       0.99 m/s MV Vmean:      58.9 cm/s MV Decel Time: 81 msec MV E velocity: 51.40 cm/s MV A velocity: 75.80 cm/s MV E/A ratio:  0.68 Dwayne D Callwood MD Electronically signed by Yolonda Kida MD Signature Date/Time: 08/12/2021/4:11:53 PM    Final    CT VENOGRAM HEAD  Result Date: 08/12/2021 CLINICAL DATA:  Follow-up examination for acute stroke, possible dural sinus thrombosis. EXAM: CT ANGIOGRAPHY HEAD AND NECK  CT VENOGRAM HEAD TECHNIQUE: Multidetector CT imaging of the head and neck was performed using the standard protocol during bolus administration of intravenous contrast. Multiplanar CT image reconstructions and MIPs were obtained to evaluate the vascular anatomy. Carotid stenosis measurements (when applicable) are obtained utilizing NASCET criteria, using the distal internal carotid diameter as the denominator. Dedicated CT imaging of the dural venous sinuses using the standard protocol following the administration of IV contrast. CONTRAST:  50m OMNIPAQUE IOHEXOL  350 MG/ML SOLN COMPARISON:  Comparison made with previous MRI from 08/10/2021. FINDINGS: CT HEAD FINDINGS Brain: Cerebral volume within normal limits. Previously identified punctate right frontal infarcts not visible by CT. No other acute large vessel territory infarct. No intracranial hemorrhage. No mass lesion, mass effect or midline shift. No hydrocephalus or extra-axial fluid collection. Vascular: No hyperdense vessel. Skull: Scalp soft tissues and calvarium within normal limits. Sinuses: Scattered mucosal thickening within the ethmoidal air cells and maxillary sinuses. Trace chronic right mastoid effusion. Orbits: Globes and orbital soft tissues within normal limits. Review of the MIP images confirms the above findings CTA NECK FINDINGS Aortic arch: Visualized aortic arch normal in caliber with normal 3 vessel morphology. Minor for age atheromatous change along the undersurface of the aortic arch. No stenosis about the origin of the great vessels. Right carotid system: Right CCA widely patent without stenosis. Ill-defined soft tissue density partially surrounding the proximal and mid right CCA consistent with post treatment changes. Mild for age atheromatous change about the right carotid bulb and proximal right ICA without significant stenosis. Right ICA widely patent distally without stenosis dissection or occlusion. Left carotid system: Left CCA  patent from its origin to the bifurcation without stenosis. Mild for age eccentric mixed plaque at the proximal left ICA without significant stenosis. Left ICA patent distally without stenosis, dissection or occlusion. Vertebral arteries: Both vertebral arteries arise from the subclavian arteries. No proximal subclavian artery stenosis. Similarly, ill-defined soft tissue partially surrounding the proximal right vertebral artery consistent with post treatment changes. Vertebral arteries widely patent without stenosis, dissection or occlusion. Skeleton: No visible acute osseous finding. No discrete or worrisome osseous lesions. Torus mandibularis noted. Other neck: Post treatment changes seen within the mid and lower right neck. Mucosal edema within the right greater than left glottic and supraglottic larynx likely reflects post radiation changes. Layering secretions noted within the hypopharynx. Scattered soft tissue stranding within the right anterolateral neck and retropharyngeal space also consistent with post radiation changes. Patient's known ill-defined nodal conglomerate just to the right of the esophagus at the thoracic inlet again seen, somewhat difficult to measure, but grossly similar from previous. Possible invasion of the adjacent esophagus which demonstrates irregular ill-defined wall thickening again noted. Changes are relatively similar to prior neck CT from 04/01/2021. Upper chest: Right greater than left layering pleural effusions partially visualized. Associated atelectasis and/or consolidation at the partially visualized right posterior lung. 1.7 cm nodule present at the posterior right upper lobe, measuring slightly larger as compared to recent chest CT from 08/08/2021. Underlying emphysematous changes noted. 1.6 cm precarinal lymph node noted, indeterminate (series 9, image 1). Review of the MIP images confirms the above findings CTA HEAD FINDINGS Anterior circulation: Both internal carotid  arteries widely patent to the termini without stenosis. 6 mm focal outpouching arising from the cavernous left ICA consistent with aneurysm (series 11, image 252). This projects laterally. A1 segments patent bilaterally. Normal anterior communicating artery complex. Anterior cerebral arteries patent to their distal aspects without stenosis. No M1 stenosis or occlusion. Normal MCA bifurcations. Distal MCA branches well perfused and symmetric. Posterior circulation: Both V4 segments patent to the vertebrobasilar junction without stenosis. Both PICA origins patent and normal. Basilar widely patent to its distal aspect without stenosis. Superior cerebellar arteries patent bilaterally. Both PCAs primarily supplied via the basilar and are well perfused to there distal aspects. Venous sinuses: Additional delayed venous phase images were performed. Normal enhancement seen throughout the superior sagittal sinus to the level of the torcula.  Left transverse and sigmoid sinus is patent as is the visualized left internal jugular vein. Dominant right transverse and sigmoid sinuses demonstrate heterogeneous early enhancement, but are seen to fill and opacify with additional delayed images, consistent with slow but patent flow. Straight sinus, vein of Galen, internal cerebral veins, and basal veins of Rosenthal are patent. Cavernous sinus appears patent without abnormality. Superior orbital veins grossly symmetric and normal. No appreciable cortical vein thrombosis. Anatomic variants: None significant. Review of the MIP images confirms the above findings IMPRESSION: CT HEAD IMPRESSION: Stable head CT. 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 No other acute intracranial abnormality. CTA HEAD AND NECK IMPRESSION: 1. Negative CTA for large vessel occlusion. Mild atheromatous disease for age, with no hemodynamically significant or correctable stenosis. 2. 6 mm cavernous left ICA aneurysm. 3.  Post treatment changes within the lower neck with similar size and appearance of ill-defined right paraesophageal nodal mass. Possible invasion of the adjacent esophagus again noted. 4. Right greater than left layering pleural effusions with associated atelectasis and/or consolidation. 5. 1.7 cm right upper lobe nodule, consistent with metastatic disease. This measures slightly larger in size as compared to recent chest CT from 08/08/2021. 6. 1.6 cm precarinal lymph node, indeterminate. Attention at follow-up recommended. CT VENOGRAM IMPRESSION: 1. No evidence for dural venous sinus thrombosis. 2. Evidence for patent but slow flow within the dominant right transverse and sigmoid sinuses, corresponding with the asymmetric FLAIR signal intensity seen on prior MRI. Electronically Signed   By: Jeannine Boga M.D.   On: 08/12/2021 02:12        Scheduled Meds:  aspirin  81 mg Per Tube Daily   atorvastatin  40 mg Per Tube Daily   Chlorhexidine Gluconate Cloth  6 each Topical Daily   enoxaparin (LOVENOX) injection  40 mg Subcutaneous Q24H   feeding supplement (KATE FARMS STANDARD 1.4)  325 mL Per Tube 5 X Daily   free water  120 mL Per Tube 5 X Daily   insulin aspart  0-9 Units Subcutaneous Q4H   levothyroxine  50 mcg Per Tube Q0600   pantoprazole sodium  40 mg Per Tube Daily   polyethylene glycol  17 g Per Tube Daily   Continuous Infusions:  lactated ringers     metronidazole 500 mg (08/13/21 0020)     LOS: 5 days    Time spent:25 mins. More than 50% of that time was spent in counseling and/or coordination of care.      Shelly Coss, MD [facility] P9/09/2021, 8:12 AM

## 2021-08-13 NOTE — Progress Notes (Signed)
Neurology Progress Note  Patient ID: Ivan Garcia. is a 65 y.o. with PMHx of squamous cell carcinoma of the oropharynx (diagnosed 12/14/2017, status post multiple rounds of chemotherapy, radiation), feeding tube, hypertension, hyperlipidemia, arthritis.  Initially consulted for: Possible stroke on MRI, progressive right upper extremity weakness  Subjective/Interval events: Continues to feel that his right upper extremity symptoms are stable Denies any new neurological complaints including headache, vision changes Reports his pain is well controlled on his current pain medication regimen Cardiology on board for new onset HFrEF IM planning thoracentesis today   Current Facility-Administered Medications:    0.9 %  sodium chloride infusion, , Intravenous, PRN, Shelly Coss, MD, Last Rate: 10 mL/hr at 08/13/21 0845, 250 mL at 08/13/21 0845   ALPRAZolam (XANAX) tablet 0.25 mg, 0.25 mg, Per Tube, QHS PRN, Renda Rolls, RPH, 0.25 mg at 08/13/21 0012   aspirin chewable tablet 81 mg, 81 mg, Per Tube, Daily, Renda Rolls, RPH, 81 mg at 08/13/21 1056   atorvastatin (LIPITOR) tablet 40 mg, 40 mg, Per Tube, Daily, Shelly Coss, MD, 40 mg at 08/12/21 2102   Chlorhexidine Gluconate Cloth 2 % PADS 6 each, 6 each, Topical, Daily, Sharen Hones, MD, 6 each at 08/13/21 1057   enoxaparin (LOVENOX) injection 40 mg, 40 mg, Subcutaneous, Q24H, Sharen Hones, MD, 40 mg at 08/12/21 1611   feeding supplement (KATE FARMS STANDARD 1.4) liquid 325 mL, 325 mL, Per Tube, 5 X Daily, Adhikari, Amrit, MD, 325 mL at 08/13/21 1057   free water 120 mL, 120 mL, Per Tube, 5 X Daily, Adhikari, Amrit, MD, 120 mL at 08/13/21 1057   ibuprofen (ADVIL) 100 MG/5ML suspension 600 mg, 600 mg, Per Tube, Q6H PRN, Renda Rolls, RPH   insulin aspart (novoLOG) injection 0-9 Units, 0-9 Units, Subcutaneous, Q4H, Sharen Hones, MD, 1 Units at 08/13/21 0016   labetalol (NORMODYNE) injection 10 mg, 10 mg, Intravenous, Q3H PRN,  Adhikari, Amrit, MD   lactated ringers bolus 1,000 mL, 1,000 mL, Intravenous, Once **AND** [COMPLETED] lactated ringers bolus 1,000 mL, 1,000 mL, Intravenous, Once, Stopped at 08/08/21 1333 **AND** [COMPLETED] lactated ringers bolus 1,000 mL, 1,000 mL, Intravenous, Once, Nena Polio, MD, Stopped at 08/08/21 1218   levothyroxine (SYNTHROID) tablet 50 mcg, 50 mcg, Per Tube, Q0600, Shelly Coss, MD, 50 mcg at 08/13/21 0515   metoprolol succinate (TOPROL-XL) 24 hr tablet 25 mg, 25 mg, Oral, Daily, Adhikari, Amrit, MD, 25 mg at 08/13/21 1056   morphine 2 MG/ML injection 2 mg, 2 mg, Intravenous, Q4H PRN, Tawanna Solo, Amrit, MD   pantoprazole sodium (PROTONIX) 40 mg oral suspension 40 mg, 40 mg, Per Tube, Daily, Adhikari, Amrit, MD, 40 mg at 08/13/21 1100   traMADol (ULTRAM) tablet 50 mg, 50 mg, Per Tube, Q6H PRN, Shelly Coss, MD, 50 mg at 08/13/21 0012  Facility-Administered Medications Ordered in Other Encounters:    sodium chloride flush (NS) 0.9 % injection 10 mL, 10 mL, Intravenous, PRN, Earlie Server, MD   sodium chloride flush (NS) 0.9 % injection 10 mL, 10 mL, Intravenous, PRN, Earlie Server, MD, 10 mL at 10/13/20 1034   Exam: Vitals:   08/13/21 0758 08/13/21 0900  BP: (!) 147/106 (!) 147/108  Pulse: (!) 108 (!) 106  Resp: 18 18  Temp: 97.8 F (36.6 C)   SpO2: 97% 94%   Gen: In bed, comfortable  Resp: non-labored breathing Cardiac: Perfusing extremities well  Abd: soft, nt  Neuro: MS: Awake, alert, oriented to person place and situation CN: Right eye  myosis and ptosis, stable Motor: Worsening right upper extremity weakness on my evaluation today, with elbow flexion now 1/5 (was 2/5 on Sept 8), remains 1/5 at the deltoid though does have a little more movement than yesterday, 2/5 elbow extension, 4+/5 wrist extension, 4/5 wrist flexion, 4/5 finger extension and 5/5 finger flexion Sensory: Does clarify that he has stable numbness isolated to the hand without any progression of sensory  symptoms on my testing Reflexes: 1+ biceps today on the RUE, 0 brachioradialis RUE; preserved in the LUE    Pertinent data:   MRI brachial plexus, personally reviewed and discussed with Dr. Nelia Shi of radiology IMPRESSION: 1. 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 [In discussion with Dr. Nelia Shi, cannot rule out an infiltrative plexitis secondary to malignancy] 2. Similar right paraesophageal nodal conglomerate at the thoracic inlet. Similar post treatment changes in the larynx and surrounding the right common carotid artery. 3. Unchanged moderate right pleural effusion.    CTA HEAD AND NECK personally reviewed, agree with radiology IMPRESSION: 1. Negative CTA for large vessel occlusion. Mild atheromatous disease for age, with no hemodynamically significant or correctable stenosis. 2. 6 mm cavernous left ICA aneurysm. 3. Post treatment changes within the lower neck with similar size and appearance of ill-defined right paraesophageal nodal mass. Possible invasion of the adjacent esophagus again noted. 4. Right greater than left layering pleural effusions with associated atelectasis and/or consolidation. 5. 1.7 cm right upper lobe nodule, consistent with metastatic disease. This measures slightly larger in size as compared to recent chest CT from 08/08/2021. 6. 1.6 cm precarinal lymph node, indeterminate. Attention at follow-up recommended.   CT VENOGRAM personally reviewed, agree with radiology IMPRESSION: 1. No evidence for dural venous sinus thrombosis. 2. Evidence for patent but slow flow within the dominant right transverse and sigmoid sinuses, corresponding with the asymmetric FLAIR signal intensity seen on prior MRI.   Lab Results  Component Value Date   CHOL 168 08/12/2021   HDL 68 08/12/2021   LDLCALC 85 08/12/2021   TRIG 75 08/12/2021   CHOLHDL 2.5 08/12/2021   Lab Results  Component Value Date   HGBA1C 6.0 (H) 08/09/2021    ECHO 9/9:   1.  Left ventricular ejection fraction, by estimation, is 40 to 45%. The left ventricle has mildly decreased function. The left ventricle  demonstrates global hypokinesis. Left ventricular diastolic parameters are  consistent with Grade II diastolic dysfunction (pseudonormalization).   2. Right ventricular systolic function is normal. The right ventricular size is moderately enlarged.   3. The mitral valve is normal in structure. Trivial mitral valve regurgitation.   4. The aortic valve is grossly normal. Aortic valve regurgitation is not visualized.   Impression:   MRI brachial plexus protocol personally reviewed, consistent with brachial plexitis.  Unfortunately this scan is insufficient to distinguish brachial plexitis as a postradiation effect versus secondary to infiltrative malignancy.  Discussed with Dr. Tasia Catchings of oncology as well as some neuromuscular colleagues.  EMG/nerve conduction study can be useful in these cases as a finding of myokymia would support a diagnosis of postradiation effect.  Additionally PET/CT can be useful as malignant infiltrative process would be more PET avid then postradiation effect.  Unfortunately even during the course of hospitalization, patient has experienced progression right upper extremity weakness on my evaluation --for example on my first evaluation he was 2/5 in elbow flexion and has now been 1/5, additionally he initially had preserved brachioradialis reflex though it was diminished at 1+, and now I cannot  elicit it even with Jendrassik maneuver.    Clinical features favoring an infiltrative process in this patient include the initial severe pain as described best by his sister, who noted "morphine wouldn't touch it" --abatement of the pain as weakness develops is classically described.  Additionally the rapid progression over the last 1 to 2 weeks even in the absence of additional radiation (last received radiation in January) is concerning for an infiltrative  process.  Finally in other indications of progressive metastatic disease on imaging including expanding lung nodule are concerning for disease progression.  In order to preserve the function of his dominant right hand as much as possible and potentially even reverse some of his weakness, accurately determining the etiology of the brachial plexitis is critical --one would not want to treat postradiation plexitis with additional radiation treatments but disease progression would need to be treated potentially with additional radiation versus other chemotherapeutic options (which I understand are limited in this patient who has failed multiple lines of chemotherapy).  Patient therefore would benefit significantly from transfer to higher level of care center where inpatient EMG/nerve conduction study as well as inpatient PET/CT could be obtained  Discussed with cardiology my low concern for stroke and that the patient is out of permissive hypertension window, may be started on Coreg further recommendations for his new onset heart failure with reduced EF.  Additionally discussed with Dr. Mickeal Skinner (neuro-oncology) and Dr. Cari Caraway (neurosurgery) as well as Dr. Einar Grad.   Recommendations:  #Progressive right upper extremity weakness, brachial plexitis infiltrative versus postradiation -Will benefit from EMG/nerve conduction study at an academic center capable of assessing for potential myokymia -Will benefit from PET/CT to determine next best step in treatment -Appreciate Dr. Nelly Laurence evaluation today -Appreciate discussion with Dr. Tasia Catchings -Appreciate management of comorbidities per primary team  # Possible punctate stroke - A1c at goal and LDL just above goal at 85  -Agree with atorvastatin 40 mg daily, increased from home dose of 20 mg - Echocardiogram with new reduced EF, appreciate cardiology on board - Aspirin  81 mg daily alone given very small stroke (vs artifact), reassuring vessel imaging and  likelihood of needing further procedures for his malignancy (which would be complicated by the addition of Plavix which typically needs to be held for at least 5 to 7 days prior to procedure) - Risk factor modification discussed with patient - Telemetry monitoring - Blood pressure goal              - Normotension given very small stroke versus artifact and time since event  - No contraindication to Coreg from neurology perspective  Lesleigh Noe MD-PhD [facility] [phone]  [facility] coverage for [facility] is from 8 AM to 4 AM in-house and 4 PM to 8 PM by telephone/video. 8 PM to 8 AM emergent questions or overnight urgent questions should be addressed to Teleneurology On-call or [facility] neuro[facility]ist; contact information can be found on AMION  Greater than 35 minutes were spent in the care of this patient today, greater than 50% at bedside and in direct care coordination as documented above

## 2021-08-13 NOTE — Plan of Care (Signed)
  Problem: Education: Goal: Knowledge of General Education information will improve Description: Including pain rating scale, medication(s)/side effects and non-pharmacologic comfort measures Outcome: Progressing   Problem: Nutrition: Goal: Adequate nutrition will be maintained Outcome: Progressing   Problem: Elimination: Goal: Will not experience complications related to bowel motility Outcome: Progressing Goal: Will not experience complications related to urinary retention Outcome: Progressing   Problem: Pain Managment: Goal: General experience of comfort will improve Outcome: Progressing   Problem: Skin Integrity: Goal: Risk for impaired skin integrity will decrease Outcome: Progressing   Problem: Activity: Goal: Risk for activity intolerance will decrease Outcome: Not Progressing

## 2021-08-13 NOTE — Progress Notes (Signed)
Hematology/Oncology Progress Note [facility] Telephone:([phone] [phone]  Patient Care Team: Albina Billet, MD as PCP - General (Internal Medicine) Albina Billet, MD (Internal Medicine) Bary Castilla Forest Gleason, MD (General Surgery) Noreene Filbert, MD as Referring Physician (Radiation Oncology) Earlie Server, MD as Consulting Physician (Oncology)   Name of the patient: Ivan Garcia  [id number]  August 22, 1956  Date of visit: 08/13/21   INTERVAL HISTORY-  No acute overnight event. Off nasal cannula oxygen Sister at bedside.  MRI resulted.    Review of systems- Review of Systems  Constitutional:  Negative for appetite change, chills, fever and unexpected weight change.  HENT:   Negative for hearing loss and voice change.   Eyes:  Negative for eye problems and icterus.  Respiratory:  Positive for cough and shortness of breath. Negative for chest tightness.   Cardiovascular:  Negative for chest pain and leg swelling.  Gastrointestinal:  Negative for abdominal distention and abdominal pain.  Endocrine: Negative for hot flashes.  Genitourinary:  Negative for difficulty urinating, dysuria and frequency.   Musculoskeletal:  Negative for arthralgias.  Skin:  Negative for itching and rash.  Neurological:  Negative for light-headedness and numbness.       Right upper extremity weakness  Hematological:  Negative for adenopathy. Does not bruise/bleed easily.  Psychiatric/Behavioral:  Negative for confusion.    Allergies  Allergen Reactions   Cetuximab Anaphylaxis    Patient Active Problem List   Diagnosis Date Noted   Aspiration pneumonia ([place]) 08/08/2021   Acute hypoxemic respiratory failure ([place]) 08/08/2021   Sepsis ([place]) 08/08/2021   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   Neuropathy due to chemotherapeutic drug ([place]) 05/27/2021   PEA (Pulseless electrical activity) ([place]) 05/27/2021   Examination of participant in clinical trial  05/03/2021   Chemotherapy-induced neuropathy ([place]) 07/14/2020   Bone pain 07/14/2020   Encounter for antineoplastic immunotherapy 04/27/2020   Stage 3a chronic kidney disease ([place]) 12/22/2019   Encounter for antineoplastic chemotherapy 10/06/2019   Neoplasm related pain 10/06/2019   Stage 3 chronic kidney disease ([place]) 03/24/2018   Edema 03/24/2018   Port-A-Cath in place 03/24/2018   ARF (acute renal failure) ([place]) 01/02/2018   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   Goals of care, counseling/discussion 12/10/2017   Encounter for screening colonoscopy 02/28/2017     Past Medical History:  Diagnosis Date   Arthritis    Cancer ([place])    Head and neck cancer   Hemorrhoids    Hyperlipidemia    Hypertension      Past Surgical History:  Procedure Laterality Date   COLONOSCOPY WITH PROPOFOL N/A 04/04/2017   Procedure: COLONOSCOPY WITH PROPOFOL;  Surgeon: Robert Bellow, MD;  Location: [facility] ENDOSCOPY;  Service: Endoscopy;  Laterality: N/A;   IR GASTROSTOMY TUBE MOD SED  09/24/2020   MANDIBLE SURGERY     PORT-A-CATH REMOVAL  07/17/2018   PORTA CATH INSERTION N/A 12/19/2017   Procedure: PORTA CATH INSERTION;  Surgeon: Algernon Huxley, MD;  Location: [facility];  Service: Cardiovascular;  Laterality: N/A;   PORTA CATH INSERTION N/A 07/17/2018   Procedure: PORTA CATH INSERTION;  Surgeon: Algernon Huxley, MD;  Location: [facility];  Service: Cardiovascular;  Laterality: N/A;   PORTA CATH INSERTION N/A 09/18/2019   Procedure: PORTA CATH INSERTION;  Surgeon: Algernon Huxley, MD;  Location: [facility];  Service: Cardiovascular;  Laterality: N/A;   TONSILLECTOMY      Social  History   Socioeconomic History   Marital status: Single    Spouse name: Not on file   Number of children: Not on file   Years of education: Not on file   Highest education level: Not on file  Occupational History   Not on file  Tobacco Use   Smoking status: Never    Smokeless tobacco: Never  Vaping Use   Vaping Use: Never used  Substance and Sexual Activity   Alcohol use: No   Drug use: No   Sexual activity: Not on file  Other Topics Concern   Not on file  Social History Narrative   Not on file   Social Determinants of Health   Financial Resource Strain: Not on file  Food Insecurity: Not on file  Transportation Needs: Not on file  Physical Activity: Not on file  Stress: Not on file  Social Connections: Not on file  Intimate Partner Violence: Not on file     Family History  Problem Relation Age of Onset   Breast cancer Mother    Asthma Mother    Congestive Heart Failure Mother    Prostate cancer Father    Brain cancer Father    Bladder Cancer Father      Current Facility-Administered Medications:    0.9 %  sodium chloride infusion, , Intravenous, PRN, Shelly Coss, MD, Last Rate: 10 mL/hr at 08/13/21 0845, 250 mL at 08/13/21 0845   ALPRAZolam (XANAX) tablet 0.25 mg, 0.25 mg, Per Tube, QHS PRN, Renda Rolls, RPH, 0.25 mg at 08/13/21 0012   aspirin chewable tablet 81 mg, 81 mg, Per Tube, Daily, Renda Rolls, RPH, 81 mg at 08/13/21 1056   atorvastatin (LIPITOR) tablet 40 mg, 40 mg, Per Tube, Daily, Adhikari, Amrit, MD, 40 mg at 08/12/21 2102   Chlorhexidine Gluconate Cloth 2 % PADS 6 each, 6 each, Topical, Daily, Sharen Hones, MD, 6 each at 08/13/21 1057   enoxaparin (LOVENOX) injection 40 mg, 40 mg, Subcutaneous, Q24H, Sharen Hones, MD, 40 mg at 08/12/21 1611   feeding supplement (KATE FARMS STANDARD 1.4) liquid 325 mL, 325 mL, Per Tube, 5 X Daily, Adhikari, Amrit, MD, 325 mL at 08/13/21 1057   free water 120 mL, 120 mL, Per Tube, 5 X Daily, Adhikari, Amrit, MD, 120 mL at 08/13/21 1057   ibuprofen (ADVIL) 100 MG/5ML suspension 600 mg, 600 mg, Per Tube, Q6H PRN, Belue, Alver Sorrow, RPH   insulin aspart (novoLOG) injection 0-9 Units, 0-9 Units, Subcutaneous, Q4H, Sharen Hones, MD, 1 Units at 08/13/21 0016   labetalol (NORMODYNE)  injection 10 mg, 10 mg, Intravenous, Q3H PRN, Adhikari, Amrit, MD   lactated ringers bolus 1,000 mL, 1,000 mL, Intravenous, Once **AND** [COMPLETED] lactated ringers bolus 1,000 mL, 1,000 mL, Intravenous, Once, Stopped at 08/08/21 1333 **AND** [COMPLETED] lactated ringers bolus 1,000 mL, 1,000 mL, Intravenous, Once, Nena Polio, MD, Stopped at 08/08/21 1218   levothyroxine (SYNTHROID) tablet 50 mcg, 50 mcg, Per Tube, Q0600, Shelly Coss, MD, 50 mcg at 08/13/21 0515   metoprolol succinate (TOPROL-XL) 24 hr tablet 25 mg, 25 mg, Oral, Daily, Adhikari, Amrit, MD, 25 mg at 08/13/21 1056   morphine 2 MG/ML injection 2 mg, 2 mg, Intravenous, Q4H PRN, Adhikari, Amrit, MD   pantoprazole sodium (PROTONIX) 40 mg oral suspension 40 mg, 40 mg, Per Tube, Daily, Adhikari, Amrit, MD, 40 mg at 08/13/21 1100   traMADol (ULTRAM) tablet 50 mg, 50 mg, Per Tube, Q6H PRN, Shelly Coss, MD, 50 mg at 08/13/21 1303  Facility-Administered Medications Ordered in Other Encounters:    sodium chloride flush (NS) 0.9 % injection 10 mL, 10 mL, Intravenous, PRN, Earlie Server, MD   sodium chloride flush (NS) 0.9 % injection 10 mL, 10 mL, Intravenous, PRN, Earlie Server, MD, 10 mL at 10/13/20 1034   Physical exam:  Vitals:   08/13/21 0758 08/13/21 0900 08/13/21 1157 08/13/21 1218  BP: (!) 147/106 (!) 147/108 (!) 140/96 (!) 151/100  Pulse: (!) 108 (!) 106 (!) 102 99  Resp: '18 18 17 '$ (!) 22  Temp: 97.8 F (36.6 C)  98.1 F (36.7 C) 98 F (36.7 C)  TempSrc:    Oral  SpO2: 97% 94% 94% 95%  Weight:      Height:      Physical Exam Constitutional:      General: He is not in acute distress.    Appearance: He is not diaphoretic.  HENT:     Head: Normocephalic and atraumatic.     Nose: Nose normal.     Mouth/Throat:     Pharynx: No oropharyngeal exudate.  Eyes:     General: No scleral icterus.    Pupils: Pupils are equal, round, and reactive to light.  Cardiovascular:     Rate and Rhythm: Normal rate and regular rhythm.      Heart sounds: No murmur heard. Pulmonary:     Effort: Pulmonary effort is normal. No respiratory distress.     Comments: Decreased breath sound bilaterally, right worse than left. Abdominal:     General: There is no distension.     Palpations: Abdomen is soft.     Tenderness: There is no abdominal tenderness.     Comments: + PEG tube  Musculoskeletal:     Cervical back: Normal range of motion and neck supple.     Right lower leg: No edema.     Left lower leg: No edema.  Skin:    General: Skin is warm and dry.     Findings: No erythema.  Neurological:     Mental Status: He is alert and oriented to person, place, and time.     Motor: No abnormal muscle tone.     Comments: Decreased right upper extremity strength,2+ Normal bilateral lower extremity strength and left upper extremity strength.  Psychiatric:        Mood and Affect: Mood and affect normal.  CMP Latest Ref Rng & Units 08/13/2021  Glucose 70 - 99 mg/dL 114(H)  BUN 8 - 23 mg/dL 18  Creatinine 0.61 - 1.24 mg/dL 0.75  Sodium 135 - 145 mmol/L 129(L)  Potassium 3.5 - 5.1 mmol/L 3.9  Chloride 98 - 111 mmol/L 95(L)  CO2 22 - 32 mmol/L 29  Calcium 8.9 - 10.3 mg/dL 8.4(L)  Total Protein 6.5 - 8.1 g/dL -  Total Bilirubin 0.3 - 1.2 mg/dL -  Alkaline Phos 38 - 126 U/L -  AST 15 - 41 U/L -  ALT 0 - 44 U/L -   CBC Latest Ref Rng & Units 08/12/2021  WBC 4.0 - 10.5 K/uL 10.2  Hemoglobin 13.0 - 17.0 g/dL 14.4  Hematocrit 39.0 - 52.0 % 41.4  Platelets 150 - 400 K/uL 335    RADIOGRAPHIC STUDIES: I have personally reviewed the radiological images as listed and agreed with the findings in the report. CT ANGIO HEAD NECK W WO CM  Result Date: 08/12/2021 CLINICAL DATA:  Follow-up examination for acute stroke, possible dural sinus thrombosis. EXAM: CT ANGIOGRAPHY HEAD AND NECK CT VENOGRAM HEAD TECHNIQUE: Multidetector  CT imaging of the head and neck was performed using the standard protocol during bolus administration of intravenous  contrast. Multiplanar CT image reconstructions and MIPs were obtained to evaluate the vascular anatomy. Carotid stenosis measurements (when applicable) are obtained utilizing NASCET criteria, using the distal internal carotid diameter as the denominator. Dedicated CT imaging of the dural venous sinuses using the standard protocol following the administration of IV contrast. CONTRAST:  75m OMNIPAQUE IOHEXOL 350 MG/ML SOLN COMPARISON:  Comparison made with previous MRI from 08/10/2021. FINDINGS: CT HEAD FINDINGS Brain: Cerebral volume within normal limits. Previously identified punctate right frontal infarcts not visible by CT. No other acute large vessel territory infarct. No intracranial hemorrhage. No mass lesion, mass effect or midline shift. No hydrocephalus or extra-axial fluid collection. Vascular: No hyperdense vessel. Skull: Scalp soft tissues and calvarium within normal limits. Sinuses: Scattered mucosal thickening within the ethmoidal air cells and maxillary sinuses. Trace chronic right mastoid effusion. Orbits: Globes and orbital soft tissues within normal limits. Review of the MIP images confirms the above findings CTA NECK FINDINGS Aortic arch: Visualized aortic arch normal in caliber with normal 3 vessel morphology. Minor for age atheromatous change along the undersurface of the aortic arch. No stenosis about the origin of the great vessels. Right carotid system: Right CCA widely patent without stenosis. Ill-defined soft tissue density partially surrounding the proximal and mid right CCA consistent with post treatment changes. Mild for age atheromatous change about the right carotid bulb and proximal right ICA without significant stenosis. Right ICA widely patent distally without stenosis dissection or occlusion. Left carotid system: Left CCA patent from its origin to the bifurcation without stenosis. Mild for age eccentric mixed plaque at the proximal left ICA without significant stenosis. Left ICA  patent distally without stenosis, dissection or occlusion. Vertebral arteries: Both vertebral arteries arise from the subclavian arteries. No proximal subclavian artery stenosis. Similarly, ill-defined soft tissue partially surrounding the proximal right vertebral artery consistent with post treatment changes. Vertebral arteries widely patent without stenosis, dissection or occlusion. Skeleton: No visible acute osseous finding. No discrete or worrisome osseous lesions. Torus mandibularis noted. Other neck: Post treatment changes seen within the mid and lower right neck. Mucosal edema within the right greater than left glottic and supraglottic larynx likely reflects post radiation changes. Layering secretions noted within the hypopharynx. Scattered soft tissue stranding within the right anterolateral neck and retropharyngeal space also consistent with post radiation changes. Patient's known ill-defined nodal conglomerate just to the right of the esophagus at the thoracic inlet again seen, somewhat difficult to measure, but grossly similar from previous. Possible invasion of the adjacent esophagus which demonstrates irregular ill-defined wall thickening again noted. Changes are relatively similar to prior neck CT from 04/01/2021. Upper chest: Right greater than left layering pleural effusions partially visualized. Associated atelectasis and/or consolidation at the partially visualized right posterior lung. 1.7 cm nodule present at the posterior right upper lobe, measuring slightly larger as compared to recent chest CT from 08/08/2021. Underlying emphysematous changes noted. 1.6 cm precarinal lymph node noted, indeterminate (series 9, image 1). Review of the MIP images confirms the above findings CTA HEAD FINDINGS Anterior circulation: Both internal carotid arteries widely patent to the termini without stenosis. 6 mm focal outpouching arising from the cavernous left ICA consistent with aneurysm (series 11, image 252).  This projects laterally. A1 segments patent bilaterally. Normal anterior communicating artery complex. Anterior cerebral arteries patent to their distal aspects without stenosis. No M1 stenosis or occlusion. Normal MCA bifurcations. Distal MCA  branches well perfused and symmetric. Posterior circulation: Both V4 segments patent to the vertebrobasilar junction without stenosis. Both PICA origins patent and normal. Basilar widely patent to its distal aspect without stenosis. Superior cerebellar arteries patent bilaterally. Both PCAs primarily supplied via the basilar and are well perfused to there distal aspects. Venous sinuses: Additional delayed venous phase images were performed. Normal enhancement seen throughout the superior sagittal sinus to the level of the torcula. Left transverse and sigmoid sinus is patent as is the visualized left internal jugular vein. Dominant right transverse and sigmoid sinuses demonstrate heterogeneous early enhancement, but are seen to fill and opacify with additional delayed images, consistent with slow but patent flow. Straight sinus, vein of Galen, internal cerebral veins, and basal veins of Rosenthal are patent. Cavernous sinus appears patent without abnormality. Superior orbital veins grossly symmetric and normal. No appreciable cortical vein thrombosis. Anatomic variants: None significant. Review of the MIP images confirms the above findings IMPRESSION: CT HEAD IMPRESSION: Stable head CT. 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 No other acute intracranial abnormality. CTA HEAD AND NECK IMPRESSION: 1. Negative CTA for large vessel occlusion. Mild atheromatous disease for age, with no hemodynamically significant or correctable stenosis. 2. 6 mm cavernous left ICA aneurysm. 3. Post treatment changes within the lower neck with similar size and appearance of ill-defined right paraesophageal nodal mass. Possible invasion of the adjacent  esophagus again noted. 4. Right greater than left layering pleural effusions with associated atelectasis and/or consolidation. 5. 1.7 cm right upper lobe nodule, consistent with metastatic disease. This measures slightly larger in size as compared to recent chest CT from 08/08/2021. 6. 1.6 cm precarinal lymph node, indeterminate. Attention at follow-up recommended. CT VENOGRAM IMPRESSION: 1. No evidence for dural venous sinus thrombosis. 2. Evidence for patent but slow flow within the dominant right transverse and sigmoid sinuses, corresponding with the asymmetric FLAIR signal intensity seen on prior MRI. Electronically Signed   By: Jeannine Boga M.D.   On: 08/12/2021 02:12   DG Wrist 2 Views Right  Result Date: 08/10/2021 CLINICAL DATA:  Hand and wrist soreness and swelling EXAM: RIGHT WRIST - 2 VIEW; RIGHT HAND - 2 VIEW COMPARISON:  None. FINDINGS: Wrist: There is no acute fracture or dislocation. There is minimal positive ulnar variance. Alignment is otherwise normal. The joint spaces are preserved. The soft tissues are unremarkable. Hand: There is no acute fracture or dislocation. Alignment is normal. There is degenerative change at the thumb MCP joint. The joint spaces are otherwise preserved. The soft tissues are unremarkable. IMPRESSION: Mild degenerative change at the thumb MCP joint. Otherwise, unremarkable hand and wrist radiographs. Electronically Signed   By: Valetta Mole M.D.   On: 08/10/2021 13:33   CT HEAD WO CONTRAST (5MM)  Result Date: 08/08/2021 CLINICAL DATA:  Found on floor by family. Personal history of esophageal cancer. Feeding tube. Mental status change. Hypoxia. EXAM: CT HEAD WITHOUT CONTRAST CT CERVICAL SPINE WITHOUT CONTRAST TECHNIQUE: Multidetector CT imaging of the head and cervical spine was performed following the standard protocol without intravenous contrast. Multiplanar CT image reconstructions of the cervical spine were also generated. COMPARISON:  MRI had without and  with contrast 04/10/2021 FINDINGS: CT HEAD FINDINGS Brain: No acute infarct, hemorrhage, or mass lesion is present. No significant white matter lesions are present. The ventricles are of normal size. No significant extraaxial fluid collection is present. The brainstem and cerebellum are within normal limits. Vascular: No hyperdense vessel or unexpected calcification. Skull: Right supraorbital  soft tissue swelling and hematoma is present. No underlying fracture is present. Calvarium otherwise within normal limits. No other significant extracranial soft tissue injury is present. Sinuses/Orbits: Mild mucosal thickening is present in the maxillary sinuses bilaterally. No air-fluid levels are present. The globes and orbits are within normal limits. CT CERVICAL SPINE FINDINGS Alignment: No significant listhesis is present. Cervical lordosis preserved. Skull base and vertebrae: Craniocervical junction is within normal limits. Vertebral body heights are normal. Narrow acute or healing fractures are present. Soft tissues and spinal canal: No prevertebral fluid or swelling. No visible canal hematoma. Esophageal mass the thoracic inlet again noted. Edema along the right vocal cord improved. Right IJ Port-A-Cath in place. Disc levels: Uncovertebral spurring leads to moderate foraminal narrowing at C3-4 and C5-6, left greater than right. Upper chest: Lung apices are clear. IMPRESSION: 1. Right supraorbital soft tissue swelling and hematoma without underlying fracture. 2. Normal CT appearance of the brain. 3. No acute fracture or traumatic subluxation in the cervical spine. 4. Multilevel degenerative changes of the cervical spine as described. Electronically Signed   By: San Morelle M.D.   On: 08/08/2021 13:54   CT Angio Chest PE W and/or Wo Contrast  Result Date: 08/08/2021 CLINICAL DATA:  History metastatic or pharyngeal squamous carcinoma. Found on floor at home and hypoxic. Elevated white blood cell count, D-dimer  and lipase. EXAM: CT ANGIOGRAPHY CHEST WITH CONTRAST TECHNIQUE: Multidetector CT imaging of the chest was performed using the standard protocol during bolus administration of intravenous contrast. Multiplanar CT image reconstructions and MIPs were obtained to evaluate the vascular anatomy. CONTRAST:  63m OMNIPAQUE IOHEXOL 350 MG/ML SOLN COMPARISON:  Prior CT of the chest on 04/01/2021 FINDINGS: Cardiovascular: The pulmonary arteries are adequately opacified. There is no evidence of pulmonary embolism. Central pulmonary arteries are normal in caliber. The thoracic aorta is normal in caliber. Stable heart size. Stable calcified coronary artery plaque. 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 No evidence of mediastinal or hilar lymphadenopathy. Increased prominence of some adjacent superior left axillary lymph nodes with the largest measuring approximately 1 cm in short axis. Lungs/Pleura: New extensive airspace consolidation and volume loss of the right lower lobe and right middle lobe with visible occlusive debris in the bronchus intermedius and scattered small air bronchograms in consolidated lung. Visible nearly occlusive debris in the left mainstem bronchus and debris in the left lower lobe bronchus with near complete atelectasis of the left lower lobe. Focal airspace disease in the medial aspect of the right middle lobe and posterior left upper lobe. Pulmonary findings are consistent with bilateral pneumonia and highly suggestive of aspiration pneumonia given the extensive debris in the airways. No significant associated pleural fluid or evidence of pneumothorax. Known metastatic pulmonary nodule in the superior right upper lobe shows interval enlargement since the prior study in April. This previously measured 8 mm and now measures 13 x 13 mm. Metastatic mass in the inferior aspect of  the right upper lobe previously measuring 1.2 x 1.3 cm shows enlargement and now measures approximately 2.8 x 2.9 cm. Previously noted right lower lobe pulmonary nodule is not visible on today study due to the extensive consolidation and volume loss of the right lower lobe. Musculoskeletal: No chest wall abnormality. No acute or significant osseous findings. Review of the MIP images confirms the above findings. IMPRESSION: 1. No evidence of pulmonary embolism. 2. New extensive airspace  consolidation and volume loss of the right lower lobe, right middle lobe and left lower lobe with significant debris in the right bronchus intermedius, left mainstem bronchus and left lower lobe bronchus. Additional airspace disease in the right middle lobe and left upper lobe. Findings are suggestive of bilateral aspiration pneumonia. 3. Progression of metastatic disease in the right lung with enlargement of superior right upper lobe and inferior right upper lobe nodules as noted above. The previously noted right lower lobe nodule cannot be assessed as it is obscured by consolidation of the right lower lobe. 4. 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 5. Increased prominence of left axillary lymph nodes which only measure 1 cm in short axis. Electronically Signed   By: Aletta Edouard M.D.   On: 08/08/2021 14:11   CT Cervical Spine Wo Contrast  Result Date: 08/08/2021 CLINICAL DATA:  Found on floor by family. Personal history of esophageal cancer. Feeding tube. Mental status change. Hypoxia. EXAM: CT HEAD WITHOUT CONTRAST CT CERVICAL SPINE WITHOUT CONTRAST TECHNIQUE: Multidetector CT imaging of the head and cervical spine was performed following the standard protocol without intravenous contrast. Multiplanar CT image reconstructions of the cervical spine were also generated. COMPARISON:  MRI had without and with contrast 04/10/2021 FINDINGS: CT HEAD FINDINGS Brain: No acute infarct, hemorrhage,  or mass lesion is present. No significant white matter lesions are present. The ventricles are of normal size. No significant extraaxial fluid collection is present. The brainstem and cerebellum are within normal limits. Vascular: No hyperdense vessel or unexpected calcification. Skull: Right supraorbital soft tissue swelling and hematoma is present. No underlying fracture is present. Calvarium otherwise within normal limits. No other significant extracranial soft tissue injury is present. Sinuses/Orbits: Mild mucosal thickening is present in the maxillary sinuses bilaterally. No air-fluid levels are present. The globes and orbits are within normal limits. CT CERVICAL SPINE FINDINGS Alignment: No significant listhesis is present. Cervical lordosis preserved. Skull base and vertebrae: Craniocervical junction is within normal limits. Vertebral body heights are normal. Narrow acute or healing fractures are present. Soft tissues and spinal canal: No prevertebral fluid or swelling. No visible canal hematoma. Esophageal mass the thoracic inlet again noted. Edema along the right vocal cord improved. Right IJ Port-A-Cath in place. Disc levels: Uncovertebral spurring leads to moderate foraminal narrowing at C3-4 and C5-6, left greater than right. Upper chest: Lung apices are clear. IMPRESSION: 1. Right supraorbital soft tissue swelling and hematoma without underlying fracture. 2. Normal CT appearance of the brain. 3. No acute fracture or traumatic subluxation in the cervical spine. 4. Multilevel degenerative changes of the cervical spine as described. Electronically Signed   By: San Morelle M.D.   On: 08/08/2021 13:54   MR BRAIN WO CONTRAST  Result Date: 08/11/2021 CLINICAL DATA:  Initial evaluation for neuro deficit, stroke suspected, cervical radiculopathy. EXAM: MRI HEAD WITHOUT CONTRAST TECHNIQUE: Multiplanar, multiecho pulse sequences of the brain and surrounding structures were obtained without intravenous  contrast. COMPARISON:  Prior study from 08/08/2021. FINDINGS: MRI HEAD FINDINGS Brain: Cerebral volume within normal limits. No significant cerebral white matter disease for age. There are 2 subtle adjacent punctate foci of diffusion abnormality involving the subcortical aspect of the posterior right frontal region (series 5, images 36, 35), suspicious for tiny acute to early subacute ischemic infarcts. No associated hemorrhage or mass effect. No other diffusion abnormality to suggest acute or subacute ischemia. Gray-white matter differentiation otherwise maintained. No encephalomalacia to suggest chronic cortical infarction elsewhere within the  brain. No other evidence for acute or chronic intracranial hemorrhage. No mass lesion, midline shift or mass effect. No hydrocephalus or extra-axial fluid collection. Pituitary gland and suprasellar region within normal limits. Midline structures intact. Vascular: There is asymmetric FLAIR signal with intermediate T1 signal intensity involving the right transverse and sigmoid sinus (series 15, image 14). While this finding is favored to reflect slow/sluggish flow, possible thrombus is difficult to exclude. Major intracranial vascular flow voids are otherwise maintained. Skull and upper cervical spine: Craniocervical junction within normal limits. Bone marrow signal intensity normal. No focal marrow replacing lesion. No scalp soft tissue abnormality. Sinuses/Orbits: Globes and orbital soft tissues within normal limits. Mild scattered mucosal thickening noted within the ethmoidal air cells and maxillary sinuses. Paranasal sinuses are otherwise clear. Small right mastoid effusion noted, of doubtful significance. Inner ear structures grossly normal. Other: None. IMPRESSION: 1. Two subtle adjacent punctate foci of diffusion abnormality involving the subcortical aspect of the posterior right frontal region, suspicious for tiny acute to early subacute ischemic infarcts. No  associated hemorrhage or mass effect. 2. Asymmetric FLAIR signal intensity involving the right transverse and sigmoid sinus. While this finding is favored to reflect slow/sluggish flow, possible thrombus is difficult to exclude. Further assessment with dedicated MRV, with and without contrast, suggested for further evaluation. 3. Otherwise normal brain MRI for age. Electronically Signed   By: Jeannine Boga M.D.   On: 08/11/2021 00:53   MR CHEST W WO CONTRAST  Result Date: 08/12/2021 CLINICAL DATA:  Progressive right upper extremity weakness. 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 EXAM: MR CHEST WITH AND WITHOUT CONTRAST TECHNIQUE: Multiplanar, multisequence MR imaging of the right brachial plexus was performed before and after the administration of intravenous contrast. CONTRAST:  7.76m GADAVIST GADOBUTROL 1 MMOL/ML IV SOLN COMPARISON:  CTA head and neck from yesterday. FINDINGS: Spinal cord Normal caliber and signal of visualized spinal cord. Brachial plexus There is diffusely increased T2 signal and mild enhancement involving the right brachial plexus roots, trunks, divisions, cords, and branches. Muscles and tendons Incompletely evaluated mild edema involving the right rotator cuff muscles. Bones Please see separate MRI cervical spine reports from today and yesterday. Other findings Unchanged moderate right pleural effusion. Similar right paraesophageal nodal conglomerate at the thoracic inlet. Edematous glottic and supraglottic larynx consistent with post radiation change. Similar layering secretions in the hypopharynx. Non-enhancing soft tissue thickening surrounding the right common carotid artery, also consistent with post treatment change. IMPRESSION: 1. 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 2. Similar right paraesophageal nodal conglomerate at the thoracic inlet. Similar post treatment changes in the larynx and surrounding the right common  carotid artery. 3. Unchanged moderate right pleural effusion. Electronically Signed   By: WTitus DubinM.D.   On: 08/12/2021 20:15   MR CERVICAL SPINE WO CONTRAST  Result Date: 08/11/2021 CLINICAL DATA:  Initial evaluation for cervical radiculopathy. History of oropharyngeal squamous cell carcinoma. EXAM: MRI CERVICAL SPINE WITHOUT CONTRAST TECHNIQUE: Multiplanar, multisequence MR imaging of the cervical spine was performed. No intravenous contrast was administered. COMPARISON:  Prior CT of the neck from 01/05/2021. FINDINGS: Alignment: Straightening of the normal cervical lordosis. No listhesis. Vertebrae: Vertebral body height maintained without acute or chronic fracture. Suspected postradiation changes with possible fatty marrow conversion within the visualized osseous structures. Mild marrow edema involving the anterior aspects of the C4 through C7 vertebral bodies could be post treatment related as well. No discrete or worrisome osseous lesions. Facet arthrosis with associated marrow edema present  about the left C3-4 facet, which could contribute to neck pain. Cord: Normal signal and morphology. Posterior Fossa, vertebral arteries, paraspinal tissues: Craniocervical junction within normal limits. Irregular appearance of the visualized hypopharynx with layering retropharyngeal effusion and right paraesophageal nodal conglomerate, grossly similar to prior neck CT. Normal flow voids seen within the vertebral arteries bilaterally. Disc levels: C2-C3: Mild left eccentric disc bulge with left-sided uncinate spurring. Mild left-sided facet hypertrophy. No spinal stenosis. Foramina remain patent. C3-C4: Broad-based posterior disc osteophyte complex flattens and partially effaces the ventral thecal sac, asymmetric to the left. Mild left-sided spinal stenosis without cord impingement. Superimposed moderate left-sided facet degeneration. Moderate left with mild right C4 foraminal narrowing. C4-C5: Mild disc bulge  with uncovertebral spurring. Mild left greater than right facet hypertrophy. No spinal stenosis. Mild bilateral C5 foraminal narrowing. C5-C6: Broad-based disc osteophyte complex flattens and partially effaces the ventral thecal sac, asymmetric to the right. Mild spinal stenosis with mild flattening of the ventral cord, but no cord signal changes. Severe right worse than left C6 foraminal narrowing. C6-C7: Minimal disc bulge with bilateral uncovertebral spurring. No spinal stenosis. Superimposed mild left-sided facet hypertrophy. Mild bilateral C7 foraminal narrowing. C7-T1: Negative interspace. Mild left-sided facet hypertrophy. No stenosis. Visualized upper thoracic spine demonstrates no significant finding. IMPRESSION: 1. Degenerative disc osteophyte at C5-6 with resultant mild spinal stenosis, with severe bilateral C6 foraminal narrowing. 2. Left eccentric disc osteophyte and facet arthrosis at C3-4 with resultant mild left-sided spinal stenosis, with moderate left C4 foraminal narrowing. 3. More mild multilevel spondylosis elsewhere within the cervical spine as above. No other significant stenosis or neural impingement. 4. Post treatment changes within the visualized neck with associated right paraesophageal nodal conglomerate, partially visualized, but grossly similar to previous neck CT from 01/05/2021. Electronically Signed   By: Jeannine Boga M.D.   On: 08/11/2021 01:10   MR CERVICAL SPINE W CONTRAST  Result Date: 08/12/2021 CLINICAL DATA:  Head and neck cancer, known C-spine involvement EXAM: MRI CERVICAL SPINE WITH CONTRAST TECHNIQUE: Multiplanar, multisequence MR imaging of the cervical spine was performed following the administration of intravenous contrast. COMPARISON:  08/10/2021 MRI cervical spine without contrast, 05/31/2021 MRI cervical spine with and without contrast. Correlation is also made with CTA head neck 08/11/2021 FINDINGS: Alignment: Physiologic. Vertebrae: Diffuse fatty  replacement of the bone marrow secondary to prior radiation in the neck. Redemonstrated small areas of enhancement at the anterior aspect of C5, C6, C7, and T1, which appear overall similar to the prior exam. No acute fracture. Cord: No abnormal enhancement. Posterior Fossa, vertebral arteries, paraspinal tissues: Mucosal thickening and enhancement in the esophagus, with a nonenhancing fluid collection at the right aspect. Posterior fossa is unremarkable. Disc levels: Please see previous MRI of the cervical spine 08/10/2021. IMPRESSION: Redemonstrated small areas of enhancement at the anterior aspects of C5, C6, C7, and T1, which appear overall similar to the prior exam. No new foci of abnormal osseous enhancement. No evidence of cord enhancement. Electronically Signed   By: Merilyn Baba M.D.   On: 08/12/2021 17:19   CT ABDOMEN PELVIS W CONTRAST  Result Date: 08/08/2021 CLINICAL DATA:  History of metastatic oropharyngeal squamous carcinoma. Found on floor at home and hypoxic. Elevated white blood cell count, D-dimer and lipase. EXAM: CT ABDOMEN AND PELVIS WITH CONTRAST TECHNIQUE: Multidetector CT imaging of the abdomen and pelvis was performed using the standard protocol following bolus administration of intravenous contrast. CONTRAST:  30m OMNIPAQUE IOHEXOL 350 MG/ML SOLN COMPARISON:  CT of the abdomen without contrast  on 09/15/2020 and PET scan on 11/09/2020 FINDINGS: Hepatobiliary: Stable left lobe hepatic cyst. Gallbladder and bile ducts are unremarkable. Pancreas: Unremarkable. No pancreatic ductal dilatation or surrounding inflammatory changes. Spleen: Normal in size without focal abnormality. Adrenals/Urinary Tract: Stable right renal cyst. No hydronephrosis or calculi. No solid renal masses or adrenal masses. The bladder is unremarkable. Stomach/Bowel: Gastrostomy tube present with normal positioning within the lumen of the stomach. No evidence of bowel obstruction, ileus or inflammation. No mass  lesions are seen involving the bowel. Diverticulosis of the descending and sigmoid colon without evidence of diverticulitis. No free intraperitoneal air. Vascular/Lymphatic: No significant vascular findings are present. No enlarged abdominal or pelvic lymph nodes. Reproductive: Prostate is unremarkable. Other: No abdominal wall hernia or abnormality. No abdominopelvic ascites. Musculoskeletal: No acute or significant osseous findings. IMPRESSION: No acute findings in the abdomen or pelvis. Gastrostomy tube is present and in appropriate position. Electronically Signed   By: Aletta Edouard M.D.   On: 08/08/2021 14:22   DG Hand 2 View Right  Result Date: 08/10/2021 CLINICAL DATA:  Hand and wrist soreness and swelling EXAM: RIGHT WRIST - 2 VIEW; RIGHT HAND - 2 VIEW COMPARISON:  None. FINDINGS: Wrist: There is no acute fracture or dislocation. There is minimal positive ulnar variance. Alignment is otherwise normal. The joint spaces are preserved. The soft tissues are unremarkable. Hand: There is no acute fracture or dislocation. Alignment is normal. There is degenerative change at the thumb MCP joint. The joint spaces are otherwise preserved. The soft tissues are unremarkable. IMPRESSION: Mild degenerative change at the thumb MCP joint. Otherwise, unremarkable hand and wrist radiographs. Electronically Signed   By: Valetta Mole M.D.   On: 08/10/2021 13:33   DG Chest Portable 1 View  Result Date: 08/08/2021 CLINICAL DATA:  Low O2 saturation EXAM: PORTABLE CHEST 1 VIEW COMPARISON:  None FINDINGS: There is a chest port with catheter tip overlying the right atrium. Unchanged, enlarged cardiac silhouette. There are low lung volumes with bibasilar atelectasis and possible small effusions. Nodular opacities in the right lung consistent with known pulmonary nodules. No new focal airspace disease. No visible pneumothorax. Bilateral shoulder degenerative changes. No acute osseous abnormality. Thoracic spondylosis.  IMPRESSION: Low lung volumes with bibasilar subsegmental atelectasis and probable small bilateral pleural effusions. Electronically Signed   By: Maurine Simmering M.D.   On: 08/08/2021 09:54   ECHOCARDIOGRAM COMPLETE  Result Date: 08/12/2021    ECHOCARDIOGRAM REPORT   Patient Name:   Kendre Kollman [facility] Date of Exam: 08/12/2021 Medical Rec #:  [id number]              Height:       70.0 in Accession #:    [id number]             Weight:       189.2 lb Date of Birth:  [birth year]              BSA:          2.038 m Patient Age:    20 years               BP:           148/100 mmHg Patient Gender: M                      HR:           102 bpm. Exam Location:  [facility] Procedure: 2D Echo, Color Doppler and Cardiac Doppler Indications:  I63.9 Stroke  History:         Patient has no prior history of Echocardiogram examinations.                  Risk Factors:Hypertension and Dyslipidemia.  Sonographer:     Charmayne Sheer Referring Phys:  [id number] AMRIT ADHIKARI Diagnosing Phys: Yolonda Kida MD  Sonographer Comments: Technically challenging study due to limited acoustic windows. Image acquisition challenging due to patient body habitus and Image acquisition challenging due to respiratory motion. IMPRESSIONS  1. Left ventricular ejection fraction, by estimation, is 40 to 45%. The left ventricle has mildly decreased function. The left ventricle demonstrates global hypokinesis. Left ventricular diastolic parameters are consistent with Grade II diastolic dysfunction (pseudonormalization).  2. Right ventricular systolic function is normal. The right ventricular size is moderately enlarged.  3. The mitral valve is normal in structure. Trivial mitral valve regurgitation.  4. The aortic valve is grossly normal. Aortic valve regurgitation is not visualized. FINDINGS  Left Ventricle: Left ventricular ejection fraction, by estimation, is 40 to 45%. The left ventricle has mildly decreased function. The left ventricle demonstrates global  hypokinesis. The left ventricular internal cavity size was normal in size. There is  no left ventricular hypertrophy. Left ventricular diastolic parameters are consistent with Grade II diastolic dysfunction (pseudonormalization). Right Ventricle: The right ventricular size is moderately enlarged. No increase in right ventricular wall thickness. Right ventricular systolic function is normal. Left Atrium: Left atrial size was normal in size. Right Atrium: Right atrial size was normal in size. Pericardium: There is no evidence of pericardial effusion. Mitral Valve: The mitral valve is normal in structure. Trivial mitral valve regurgitation. MV peak gradient, 3.9 mmHg. The mean mitral valve gradient is 2.0 mmHg. Tricuspid Valve: The tricuspid valve is grossly normal. Tricuspid valve regurgitation is mild. Aortic Valve: The aortic valve is grossly normal. Aortic valve regurgitation is not visualized. Aortic valve mean gradient measures 2.0 mmHg. Aortic valve peak gradient measures 3.7 mmHg. Aortic valve area, by VTI measures 3.38 cm. Pulmonic Valve: The pulmonic valve was grossly normal. Pulmonic valve regurgitation is not visualized. Aorta: The ascending aorta was not well visualized. IAS/Shunts: No atrial level shunt detected by color flow Doppler.  LEFT VENTRICLE PLAX 2D LVIDd:         4.30 cm  Diastology LVIDs:         3.50 cm  LV e' medial:    5.22 cm/s LV PW:         1.00 cm  LV E/e' medial:  9.8 LV IVS:        0.70 cm  LV e' lateral:   7.72 cm/s LVOT diam:     2.20 cm  LV E/e' lateral: 6.7 LV SV:         52 LV SV Index:   25 LVOT Area:     3.80 cm  LEFT ATRIUM           Index LA diam:      3.10 cm 1.52 cm/m LA Vol (A4C): 25.8 ml 12.66 ml/m  AORTIC VALVE                   PULMONIC VALVE AV Area (Vmax):    3.35 cm    PV Vmax:       0.82 m/s AV Area (Vmean):   3.45 cm    PV Vmean:      56.400 cm/s AV Area (VTI):     3.38 cm  PV VTI:        0.101 m AV Vmax:           96.30 cm/s  PV Peak grad:  2.7 mmHg AV  Vmean:          65.700 cm/s PV Mean grad:  1.0 mmHg AV VTI:            0.153 m AV Peak Grad:      3.7 mmHg AV Mean Grad:      2.0 mmHg LVOT Vmax:         84.90 cm/s LVOT Vmean:        59.700 cm/s LVOT VTI:          0.136 m LVOT/AV VTI ratio: 0.89  AORTA Ao Root diam: 3.70 cm MITRAL VALVE MV Area (PHT): 9.37 cm    SHUNTS MV Area VTI:   3.83 cm    Systemic VTI:  0.14 m MV Peak grad:  3.9 mmHg    Systemic Diam: 2.20 cm MV Mean grad:  2.0 mmHg MV Vmax:       0.99 m/s MV Vmean:      58.9 cm/s MV Decel Time: 81 msec MV E velocity: 51.40 cm/s MV A velocity: 75.80 cm/s MV E/A ratio:  0.68 Dwayne D Callwood MD Electronically signed by Yolonda Kida MD Signature Date/Time: 08/12/2021/4:11:53 PM    Final    CT VENOGRAM HEAD  Result Date: 08/12/2021 CLINICAL DATA:  Follow-up examination for acute stroke, possible dural sinus thrombosis. EXAM: CT ANGIOGRAPHY HEAD AND NECK CT VENOGRAM HEAD TECHNIQUE: Multidetector CT imaging of the head and neck was performed using the standard protocol during bolus administration of intravenous contrast. Multiplanar CT image reconstructions and MIPs were obtained to evaluate the vascular anatomy. Carotid stenosis measurements (when applicable) are obtained utilizing NASCET criteria, using the distal internal carotid diameter as the denominator. Dedicated CT imaging of the dural venous sinuses using the standard protocol following the administration of IV contrast. CONTRAST:  9m OMNIPAQUE IOHEXOL 350 MG/ML SOLN COMPARISON:  Comparison made with previous MRI from 08/10/2021. FINDINGS: CT HEAD FINDINGS Brain: Cerebral volume within normal limits. Previously identified punctate right frontal infarcts not visible by CT. No other acute large vessel territory infarct. No intracranial hemorrhage. No mass lesion, mass effect or midline shift. No hydrocephalus or extra-axial fluid collection. Vascular: No hyperdense vessel. Skull: Scalp soft tissues and calvarium within normal limits. Sinuses:  Scattered mucosal thickening within the ethmoidal air cells and maxillary sinuses. Trace chronic right mastoid effusion. Orbits: Globes and orbital soft tissues within normal limits. Review of the MIP images confirms the above findings CTA NECK FINDINGS Aortic arch: Visualized aortic arch normal in caliber with normal 3 vessel morphology. Minor for age atheromatous change along the undersurface of the aortic arch. No stenosis about the origin of the great vessels. Right carotid system: Right CCA widely patent without stenosis. Ill-defined soft tissue density partially surrounding the proximal and mid right CCA consistent with post treatment changes. Mild for age atheromatous change about the right carotid bulb and proximal right ICA without significant stenosis. Right ICA widely patent distally without stenosis dissection or occlusion. Left carotid system: Left CCA patent from its origin to the bifurcation without stenosis. Mild for age eccentric mixed plaque at the proximal left ICA without significant stenosis. Left ICA patent distally without stenosis, dissection or occlusion. Vertebral arteries: Both vertebral arteries arise from the subclavian arteries. No proximal subclavian artery stenosis. Similarly, ill-defined soft tissue partially surrounding the proximal right  vertebral artery consistent with post treatment changes. Vertebral arteries widely patent without stenosis, dissection or occlusion. Skeleton: No visible acute osseous finding. No discrete or worrisome osseous lesions. Torus mandibularis noted. Other neck: Post treatment changes seen within the mid and lower right neck. Mucosal edema within the right greater than left glottic and supraglottic larynx likely reflects post radiation changes. Layering secretions noted within the hypopharynx. Scattered soft tissue stranding within the right anterolateral neck and retropharyngeal space also consistent with post radiation changes. Patient's known  ill-defined nodal conglomerate just to the right of the esophagus at the thoracic inlet again seen, somewhat difficult to measure, but grossly similar from previous. Possible invasion of the adjacent esophagus which demonstrates irregular ill-defined wall thickening again noted. Changes are relatively similar to prior neck CT from 04/01/2021. Upper chest: Right greater than left layering pleural effusions partially visualized. Associated atelectasis and/or consolidation at the partially visualized right posterior lung. 1.7 cm nodule present at the posterior right upper lobe, measuring slightly larger as compared to recent chest CT from 08/08/2021. Underlying emphysematous changes noted. 1.6 cm precarinal lymph node noted, indeterminate (series 9, image 1). Review of the MIP images confirms the above findings CTA HEAD FINDINGS Anterior circulation: Both internal carotid arteries widely patent to the termini without stenosis. 6 mm focal outpouching arising from the cavernous left ICA consistent with aneurysm (series 11, image 252). This projects laterally. A1 segments patent bilaterally. Normal anterior communicating artery complex. Anterior cerebral arteries patent to their distal aspects without stenosis. No M1 stenosis or occlusion. Normal MCA bifurcations. Distal MCA branches well perfused and symmetric. Posterior circulation: Both V4 segments patent to the vertebrobasilar junction without stenosis. Both PICA origins patent and normal. Basilar widely patent to its distal aspect without stenosis. Superior cerebellar arteries patent bilaterally. Both PCAs primarily supplied via the basilar and are well perfused to there distal aspects. Venous sinuses: Additional delayed venous phase images were performed. Normal enhancement seen throughout the superior sagittal sinus to the level of the torcula. Left transverse and sigmoid sinus is patent as is the visualized left internal jugular vein. Dominant right transverse and  sigmoid sinuses demonstrate heterogeneous early enhancement, but are seen to fill and opacify with additional delayed images, consistent with slow but patent flow. Straight sinus, vein of Galen, internal cerebral veins, and basal veins of Rosenthal are patent. Cavernous sinus appears patent without abnormality. Superior orbital veins grossly symmetric and normal. No appreciable cortical vein thrombosis. Anatomic variants: None significant. Review of the MIP images confirms the above findings IMPRESSION: CT HEAD IMPRESSION: Stable head CT. 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 No other acute intracranial abnormality. CTA HEAD AND NECK IMPRESSION: 1. Negative CTA for large vessel occlusion. Mild atheromatous disease for age, with no hemodynamically significant or correctable stenosis. 2. 6 mm cavernous left ICA aneurysm. 3. Post treatment changes within the lower neck with similar size and appearance of ill-defined right paraesophageal nodal mass. Possible invasion of the adjacent esophagus again noted. 4. Right greater than left layering pleural effusions with associated atelectasis and/or consolidation. 5. 1.7 cm right upper lobe nodule, consistent with metastatic disease. This measures slightly larger in size as compared to recent chest CT from 08/08/2021. 6. 1.6 cm precarinal lymph node, indeterminate. Attention at follow-up recommended. CT VENOGRAM IMPRESSION: 1. No evidence for dural venous sinus thrombosis. 2. Evidence for patent but slow flow within the dominant right transverse and sigmoid sinuses, corresponding with the asymmetric FLAIR signal intensity seen on prior MRI.  Electronically Signed   By: Jeannine Boga M.D.   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  Radiation induced vs malignancy infiltration.  Discussed with Neurology Dr.Bhagat and  Radiology and I agree that PET would be helpful in determining etiology, and he will also benefit from urgent further neurology work up such as EMG/nerve conduction study and evaluation by neuromuscular expert at tertiary center  I have discussed his case with [facility] on call Hem/Onc fellow Dr.Peters Olena Heckle. Patient will be accepted by General medicine team with consult to Neurology and Oncology. I have updated Dr.Adhikari Amrit who will contact [facility] transfer center.   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 He has progressed through multiple lines of treatments. Overall prognosis is poor. He has just started on Keytruda lenvatinib recently-based on promising data from phase 1/2 trial. - recommended by [facility] Dr.Weiss CT findings is consistent with disease progression comparing to his previous CT done in April 2022. 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    He has had images done at outside facility -06/16/2021, patient had a PET scan at [place] are available in [place]. I spoke with radiology Dr. Nelia Shi.  Comparing to his PET scan in July 2022, patient has had a disease progression in his lungs nodules.   There was no uptake activities in his right brachial plexus.  He has new onset progressive symptoms now and PET may help to distinguish radiation induced plexitis vs malignancy infiltration.   # Goals of care, patient understands that his disease is aggressive and prognosis is poor. He strongly desires to continue aggressive treatments and maximally preserve his right extremity function.   # punctate stroke: appreciate neurology input - Statin, Aspirin '81mg'$  life style modification.  # Aspiration pneumonia, to finish course of Ceftriaxone and flagyl  # Pleural effusion recommend diagnostic and therapeutic  thoracentesis.  Thank you for allowing me to participate in the care of this patient.   Earlie Server, MD, PhD Hematology Oncology [facility] Pager- [phone] 08/13/2021

## 2021-08-13 NOTE — Progress Notes (Addendum)
Pt accepted to [facility] Room 6310 for neuro f/u, MD's (Amrit & Jan) aware of the transfer Report called to Nurse Gerrit Friends) receiving the patient Transport [facility]) called about the transfer and information provided Patient updated on plan of care, and endorses understanding  Pt picked up by [facility] at 445am, left unit at [facility]

## 2021-08-14 LAB — GLUCOSE, CAPILLARY
Glucose-Capillary: 102 mg/dL — ABNORMAL HIGH (ref 70–99)
Glucose-Capillary: 90 mg/dL (ref 70–99)

## 2021-08-14 NOTE — Discharge Summary (Signed)
Physician Discharge Summary  Ivan Garcia. 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  On presentation he was found to be hypoxemic.  Patient has severe dysphagia and has been strictly n.p.o. and gets nutrition from the tube feed.  On presentation, he was tachycardic, blood pressure was stable, he required 5 L of oxygen for maintenance of saturation.  Lab work showed hyponatremia, elevated glucose, creatinine of 1.44.  COVID screen test negative.  Chest CT scan showed multilobar aspiration pneumonia.  Started on  antibiotics.  Orthopedics consulted for severe carpal tunnel syndrome of the right hand.  He was found to have new onset weakness of right upper extremity, MRI of the brain showed acute/subacute ischemic infarct on the right frontal lobe.  Neurology following. MRI cervical spine with contrast, MRI brachial plexus study done.  Oncology, neurosurgery also consulted.  Cardiology also following for finding of low ejection fraction.  Neurology recommended him to be transferred to [facility] for emergent PET CT/EMG/nerve conduction test because of concern of metastatic spread to brachial plexus resulting in profound weakness of right upper extremity.  Following problems were addressed during his hospitalization:    Acute hypoxic respiratory failure secondary to multilobar aspiration pneumonia/severe sepsis: Met septic criteria with tachycardia, leukocytosis, lactic acidosis, AKI.  Required 5 L of oxygen for  maintenance of saturation on admission.Started on broad-spectrum antibiotics with ceftriaxone and Flagyl .  Completed antibiotic course.  Currently respiratory status stable, on room air   right upper extremity weakness/acute ischemic stroke: He was found to have new onset weakness of right upper extremity .MRI of the brain showed acute/subacute ischemic infarct on the right frontal lobe.  MRI of the cervical spine showed severe bilateral foraminal narrowing, degenerative changes, no significant spinal stenosis.Neurology consulted .  Started on aspirin 81 mg daily, Lipitor. Hemoglobin A1c of 6.  LDL of 85 CT angio head and neck was negative for large vessel occlusion,showed 6 mm cavernous left ICA aneurysm.  CT venogram did not show dural venous sinus thrombosis. 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  We suspected that right upper extremity weakness could be associated with progression of his oropharyngeal malignancy.  Oncology, neurosurgery consulted .  Neurosurgery did not recommend any surgery at the moment. Neurology recommended him to be transferred to [facility] for emergent PET CT/EMG/nerve conduction test because of concern of metastatic spread to brachial plexus resulting in profound weakness of right upper extremity.  Mild systolic congestive heart failure: Echo showed EF of 40 to 45%, global hypokinesis, grade 2 diastolic dysfunction.  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  Strictly n.p.o. at home. Follows with oncology as an outpatient.On lenvatinib and also on immunotherapy CT angio head and neck showed1.7 cm right upper lobe nodule, consistent with metastatic disease.  He also has a small right-sided pleural effusion.      Fall: Has problem with ambulation.  Had bruise  on the forehead after the fall.  CT scan did not show any intracranial hemorrhage. PT/OT recommended SNF   History of hypertension: On amlodipine at home.  Currently blood pressure stable   Hyperlipidemia: On Lipitor at home.   Hypothyroidism: On Synthyroid at home.   Diabetes type 2: Hyperglycemic in presentation.  Started on sliding scale insulin.  HbA1c of 6.0   Right hand carpal tunnel syndrome/chronic pain syndrome: Follows with orthopedics.  Recently had a steroid shot for carpal tunnel Syndrome.  Impaired mobility on the right hand with pain and swelling,now much better.  Continue pain medicines/supportive care.ortho consulted here.   Debility/deconditioning: Has profound right upper extremity weakness.  PT/OT recommending skilled nursing facility on discharge         Discharge Diagnoses:  Active Problems:   Aspiration pneumonia (HCC)   Acute hypoxemic respiratory failure (HCC)   Sepsis (HCC)   Hypoxia   Pleural effusion, right   Weakness of right upper extremity   Right brachial plexitis   Squamous cell carcinoma of head and neck (HCC)    Discharge Instructions   Allergies as of 08/14/2021       Reactions   Cetuximab Anaphylaxis        Medication List     TAKE these medications    acetaminophen 160 MG/5ML solution Commonly known as: TYLENOL Take by mouth.   ALPRAZolam 0.25 MG tablet Commonly known as: XANAX Place 1 tablet (0.25 mg total) into feeding tube at bedtime as needed for anxiety.   amLODipine 5 MG tablet Commonly known as: NORVASC Take 5 mg by mouth daily.   aspirin 81 MG chewable tablet Place 1 tablet (81 mg total) into feeding tube daily.   atorvastatin 40 MG tablet Commonly known as: LIPITOR Place 1 tablet (40 mg total) into feeding tube daily. What changed:  medication strength See the new instructions.   cetirizine 10 MG tablet Commonly known as: ZYRTEC Take by mouth.    Chlorhexidine Gluconate Cloth 2 % Pads Apply 6 each topically daily.   diphenoxylate-atropine 2.5-0.025 MG/5ML liquid Commonly known as: LOMOTIL Take 5 mLs by mouth 4 (four) times daily as needed for diarrhea or loose stools.   enoxaparin 40 MG/0.4ML injection Commonly known as: LOVENOX Inject 0.4 mLs (40 mg total) into the skin daily.   famotidine 20 MG tablet Commonly known as: PEPCID Take by mouth.   feeding supplement (K[employer]ARMS STANDARD 1.4) Liqd liquid Place 325 mLs into feeding tube 5 (five) times daily.   free water Soln Place 120 mLs into feeding tube 5 (five) times daily.   gabapentin 100 MG capsule Commonly known as: NEURONTIN TAKE 2 CAPSULES BY MOUTH 3 TIMES DAILY.   HYDROcodone-acetaminophen 10-325 MG tablet Commonly known as: NORCO Take 1 tablet by mouth every 6 (six) hours as needed.   ibuprofen 100 MG/5ML suspension Commonly known as: ADVIL Place 30 mLs (600 mg total) into feeding tube every 6 (six) hours as needed for moderate pain. What changed:  how much to take how to take this when to take this reasons to take this   insulin aspart 100 UNIT/ML injection Commonly known as: novoLOG Inject 0-9 Units into the skin every 4 (four) hours.   labetalol 5 MG/ML injection Commonly known as: NORMODYNE Inject 2 mLs (10 mg total) into the vein every 3 (three) hours as needed (SBP more than 160 or DBP more than 100).   Lenvima (20 MG Daily Dose) 2 x 10 MG capsule Generic drug: lenvatinib 20 mg  daily dose Take 2 capsules (20 mg total) by mouth daily.   levothyroxine 50 MCG tablet Commonly known as: SYNTHROID Place 1 tablet (50 mcg total) into feeding tube daily at 6 (six) AM. What changed:  how to take this when to take this   lidocaine-prilocaine cream Commonly known as: EMLA Apply to affected area once   metoprolol succinate 25 MG 24 hr tablet Commonly known as: TOPROL-XL Take 1 tablet (25 mg total) by mouth daily.   morphine 2 MG/ML  injection Inject 1 mL (2 mg total) into the vein every 4 (four) hours as needed.   nystatin 100000 UNIT/ML suspension Commonly known as: MYCOSTATIN Take 5 mLs (500,000 Units total) by mouth in the morning, at noon, and at bedtime. SWISH AND SPIT   omeprazole 20 MG capsule Commonly known as: PRILOSEC Take 1 capsule (20 mg total) by mouth daily.   ondansetron 8 MG tablet Commonly known as: Zofran Take 1 tablet (8 mg total) by mouth 2 (two) times daily as needed for refractory nausea / vomiting. Start on day 3 after carboplatin chemo.   oxyCODONE 5 MG/5ML solution Commonly known as: ROXICODONE PLACE 5-10 MLS (5-10 MG TOTAL) INTO FEEDING TUBE EVERY 4 (FOUR) HOURS AS NEEDED FOR SEVERE PAIN.   pantoprazole sodium 40 mg Pack Commonly known as: PROTONIX Place 20 mLs (40 mg total) into feeding tube daily.   predniSONE 50 MG tablet Commonly known as: DELTASONE Take 1 tablet (50 mg total) by mouth daily with breakfast.   pregabalin 150 MG capsule Commonly known as: Lyrica Take 1 capsule (150 mg total) by mouth 2 (two) times daily.   prochlorperazine 10 MG tablet Commonly known as: COMPAZINE Take 1 tablet (10 mg total) by mouth every 6 (six) hours as needed (Nausea or vomiting).   senna-docusate 8.6-50 MG tablet Commonly known as: Senokot-S Take 2 tablets by mouth daily.   sodium chloride 0.9 % infusion Inject 1,000 mLs into the vein as needed (for administration of IV medications (carrier fluid)).   traMADol 50 MG tablet Commonly known as: ULTRAM Place 1 tablet (50 mg total) into feeding tube every 6 (six) hours as needed for moderate pain.       ASK your doctor about these medications    lactated ringers Inject 1,000 mLs into the vein once for 1 dose. Ask about: Should I take this medication?        Contact information for after-discharge care     Destination     [facility] Preferred SNF .   Service: Skilled Nursing Contact information: [place] [phone]                    Allergies  Allergen Reactions   Cetuximab Anaphylaxis    Consultations: Oncology, neurology, neurosurgery, cardiology, orthopedics   Procedures/Studies: CT ANGIO HEAD NECK W WO CM  Result Date: 08/12/2021 CLINICAL DATA:  Follow-up examination for acute stroke, possible dural sinus thrombosis. EXAM: CT ANGIOGRAPHY HEAD AND NECK CT VENOGRAM HEAD TECHNIQUE: Multidetector CT imaging of the head and neck was performed using the standard protocol during bolus administration of intravenous contrast. Multiplanar CT image reconstructions and MIPs were obtained to evaluate the vascular anatomy. Carotid stenosis measurements (when applicable) are obtained utilizing NASCET criteria, using the distal internal carotid diameter as the denominator. Dedicated CT imaging of the dural venous sinuses using the standard protocol following the administration of IV contrast. CONTRAST:  50m OMNIPAQUE IOHEXOL 350 MG/ML SOLN  COMPARISON:  Comparison made with previous MRI from 08/10/2021. FINDINGS: CT HEAD FINDINGS Brain: Cerebral volume within normal limits. Previously identified punctate right frontal infarcts not visible by CT. No other acute large vessel territory infarct. No intracranial hemorrhage. No mass lesion, mass effect or midline shift. No hydrocephalus or extra-axial fluid collection. Vascular: No hyperdense vessel. Skull: Scalp soft tissues and calvarium within normal limits. Sinuses: Scattered mucosal thickening within the ethmoidal air cells and maxillary sinuses. Trace chronic right mastoid effusion. Orbits: Globes and orbital soft tissues within normal limits. Review of the MIP images confirms the above findings CTA NECK FINDINGS Aortic arch: Visualized aortic arch normal in caliber with normal 3 vessel morphology. Minor for age atheromatous change along the undersurface of the aortic arch. No stenosis about the origin of  the great vessels. Right carotid system: Right CCA widely patent without stenosis. Ill-defined soft tissue density partially surrounding the proximal and mid right CCA consistent with post treatment changes. Mild for age atheromatous change about the right carotid bulb and proximal right ICA without significant stenosis. Right ICA widely patent distally without stenosis dissection or occlusion. Left carotid system: Left CCA patent from its origin to the bifurcation without stenosis. Mild for age eccentric mixed plaque at the proximal left ICA without significant stenosis. Left ICA patent distally without stenosis, dissection or occlusion. Vertebral arteries: Both vertebral arteries arise from the subclavian arteries. No proximal subclavian artery stenosis. Similarly, ill-defined soft tissue partially surrounding the proximal right vertebral artery consistent with post treatment changes. Vertebral arteries widely patent without stenosis, dissection or occlusion. Skeleton: No visible acute osseous finding. No discrete or worrisome osseous lesions. Torus mandibularis noted. Other neck: Post treatment changes seen within the mid and lower right neck. Mucosal edema within the right greater than left glottic and supraglottic larynx likely reflects post radiation changes. Layering secretions noted within the hypopharynx. Scattered soft tissue stranding within the right anterolateral neck and retropharyngeal space also consistent with post radiation changes. Patient's known ill-defined nodal conglomerate just to the right of the esophagus at the thoracic inlet again seen, somewhat difficult to measure, but grossly similar from previous. Possible invasion of the adjacent esophagus which demonstrates irregular ill-defined wall thickening again noted. Changes are relatively similar to prior neck CT from 04/01/2021. Upper chest: Right greater than left layering pleural effusions partially visualized. Associated atelectasis  and/or consolidation at the partially visualized right posterior lung. 1.7 cm nodule present at the posterior right upper lobe, measuring slightly larger as compared to recent chest CT from 08/08/2021. Underlying emphysematous changes noted. 1.6 cm precarinal lymph node noted, indeterminate (series 9, image 1). Review of the MIP images confirms the above findings CTA HEAD FINDINGS Anterior circulation: Both internal carotid arteries widely patent to the termini without stenosis. 6 mm focal outpouching arising from the cavernous left ICA consistent with aneurysm (series 11, image 252). This projects laterally. A1 segments patent bilaterally. Normal anterior communicating artery complex. Anterior cerebral arteries patent to their distal aspects without stenosis. No M1 stenosis or occlusion. Normal MCA bifurcations. Distal MCA branches well perfused and symmetric. Posterior circulation: Both V4 segments patent to the vertebrobasilar junction without stenosis. Both PICA origins patent and normal. Basilar widely patent to its distal aspect without stenosis. Superior cerebellar arteries patent bilaterally. Both PCAs primarily supplied via the basilar and are well perfused to there distal aspects. Venous sinuses: Additional delayed venous phase images were performed. Normal enhancement seen throughout the superior sagittal sinus to the level of the torcula. Left transverse and  sigmoid sinus is patent as is the visualized left internal jugular vein. Dominant right transverse and sigmoid sinuses demonstrate heterogeneous early enhancement, but are seen to fill and opacify with additional delayed images, consistent with slow but patent flow. Straight sinus, vein of Galen, internal cerebral veins, and basal veins of Rosenthal are patent. Cavernous sinus appears patent without abnormality. Superior orbital veins grossly symmetric and normal. No appreciable cortical vein thrombosis. Anatomic variants: None significant. Review of  the MIP images confirms the above findings IMPRESSION: CT HEAD IMPRESSION: Stable head CT. 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 No other acute intracranial abnormality. CTA HEAD AND NECK IMPRESSION: 1. Negative CTA for large vessel occlusion. Mild atheromatous disease for age, with no hemodynamically significant or correctable stenosis. 2. 6 mm cavernous left ICA aneurysm. 3. Post treatment changes within the lower neck with similar size and appearance of ill-defined right paraesophageal nodal mass. Possible invasion of the adjacent esophagus again noted. 4. Right greater than left layering pleural effusions with associated atelectasis and/or consolidation. 5. 1.7 cm right upper lobe nodule, consistent with metastatic disease. This measures slightly larger in size as compared to recent chest CT from 08/08/2021. 6. 1.6 cm precarinal lymph node, indeterminate. Attention at follow-up recommended. CT VENOGRAM IMPRESSION: 1. No evidence for dural venous sinus thrombosis. 2. Evidence for patent but slow flow within the dominant right transverse and sigmoid sinuses, corresponding with the asymmetric FLAIR signal intensity seen on prior MRI. Electronically Signed   By: Jeannine Boga M.D.   On: 08/12/2021 02:12   DG Wrist 2 Views Right  Result Date: 08/10/2021 CLINICAL DATA:  Hand and wrist soreness and swelling EXAM: RIGHT WRIST - 2 VIEW; RIGHT HAND - 2 VIEW COMPARISON:  None. FINDINGS: Wrist: There is no acute fracture or dislocation. There is minimal positive ulnar variance. Alignment is otherwise normal. The joint spaces are preserved. The soft tissues are unremarkable. Hand: There is no acute fracture or dislocation. Alignment is normal. There is degenerative change at the thumb MCP joint. The joint spaces are otherwise preserved. The soft tissues are unremarkable. IMPRESSION: Mild degenerative change at the thumb MCP joint. Otherwise, unremarkable hand and wrist  radiographs. Electronically Signed   By: Valetta Mole M.D.   On: 08/10/2021 13:33   CT HEAD WO CONTRAST (5MM)  Result Date: 08/08/2021 CLINICAL DATA:  Found on floor by family. Personal history of esophageal cancer. Feeding tube. Mental status change. Hypoxia. EXAM: CT HEAD WITHOUT CONTRAST CT CERVICAL SPINE WITHOUT CONTRAST TECHNIQUE: Multidetector CT imaging of the head and cervical spine was performed following the standard protocol without intravenous contrast. Multiplanar CT image reconstructions of the cervical spine were also generated. COMPARISON:  MRI had without and with contrast 04/10/2021 FINDINGS: CT HEAD FINDINGS Brain: No acute infarct, hemorrhage, or mass lesion is present. No significant white matter lesions are present. The ventricles are of normal size. No significant extraaxial fluid collection is present. The brainstem and cerebellum are within normal limits. Vascular: No hyperdense vessel or unexpected calcification. Skull: Right supraorbital soft tissue swelling and hematoma is present. No underlying fracture is present. Calvarium otherwise within normal limits. No other significant extracranial soft tissue injury is present. Sinuses/Orbits: Mild mucosal thickening is present in the maxillary sinuses bilaterally. No air-fluid levels are present. The globes and orbits are within normal limits. CT CERVICAL SPINE FINDINGS Alignment: No significant listhesis is present. Cervical lordosis preserved. Skull base and vertebrae: Craniocervical junction is within normal limits. Vertebral body heights are normal.  Narrow acute or healing fractures are present. Soft tissues and spinal canal: No prevertebral fluid or swelling. No visible canal hematoma. Esophageal mass the thoracic inlet again noted. Edema along the right vocal cord improved. Right IJ Port-A-Cath in place. Disc levels: Uncovertebral spurring leads to moderate foraminal narrowing at C3-4 and C5-6, left greater than right. Upper chest:  Lung apices are clear. IMPRESSION: 1. Right supraorbital soft tissue swelling and hematoma without underlying fracture. 2. Normal CT appearance of the brain. 3. No acute fracture or traumatic subluxation in the cervical spine. 4. Multilevel degenerative changes of the cervical spine as described. Electronically Signed   By: San Morelle M.D.   On: 08/08/2021 13:54   CT Angio Chest PE W and/or Wo Contrast  Result Date: 08/08/2021 CLINICAL DATA:  History metastatic or pharyngeal squamous carcinoma. Found on floor at home and hypoxic. Elevated white blood cell count, D-dimer and lipase. EXAM: CT ANGIOGRAPHY CHEST WITH CONTRAST TECHNIQUE: Multidetector CT imaging of the chest was performed using the standard protocol during bolus administration of intravenous contrast. Multiplanar CT image reconstructions and MIPs were obtained to evaluate the vascular anatomy. CONTRAST:  56m OMNIPAQUE IOHEXOL 350 MG/ML SOLN COMPARISON:  Prior CT of the chest on 04/01/2021 FINDINGS: Cardiovascular: The pulmonary arteries are adequately opacified. There is no evidence of pulmonary embolism. Central pulmonary arteries are normal in caliber. The thoracic aorta is normal in caliber. Stable heart size. Stable calcified coronary artery plaque. 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 No evidence of mediastinal or hilar lymphadenopathy. Increased prominence of some adjacent superior left axillary lymph nodes with the largest measuring approximately 1 cm in short axis. Lungs/Pleura: New extensive airspace consolidation and volume loss of the right lower lobe and right middle lobe with visible occlusive debris in the bronchus intermedius and scattered small air bronchograms in consolidated lung. Visible nearly occlusive debris in the left mainstem bronchus and debris in the left lower lobe bronchus with near  complete atelectasis of the left lower lobe. Focal airspace disease in the medial aspect of the right middle lobe and posterior left upper lobe. Pulmonary findings are consistent with bilateral pneumonia and highly suggestive of aspiration pneumonia given the extensive debris in the airways. No significant associated pleural fluid or evidence of pneumothorax. Known metastatic pulmonary nodule in the superior right upper lobe shows interval enlargement since the prior study in April. This previously measured 8 mm and now measures 13 x 13 mm. Metastatic mass in the inferior aspect of the right upper lobe previously measuring 1.2 x 1.3 cm shows enlargement and now measures approximately 2.8 x 2.9 cm. Previously noted right lower lobe pulmonary nodule is not visible on today study due to the extensive consolidation and volume loss of the right lower lobe. Musculoskeletal: No chest wall abnormality. No acute or significant osseous findings. Review of the MIP images confirms the above findings. IMPRESSION: 1. No evidence of pulmonary embolism. 2. New extensive airspace consolidation and volume loss of the right lower lobe, right middle lobe and left lower lobe with significant debris in the right bronchus intermedius, left mainstem bronchus and left lower lobe bronchus. Additional airspace disease in the right middle lobe and left upper lobe. Findings are suggestive of bilateral aspiration pneumonia. 3. Progression of metastatic disease in the right lung with enlargement of superior right upper lobe and inferior right upper lobe nodules as noted above. The previously  noted right lower lobe nodule cannot be assessed as it is obscured by consolidation of the right lower lobe. 4. 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 5. Increased prominence of left axillary lymph nodes which only measure 1 cm in short axis. Electronically Signed   By: Aletta Edouard M.D.   On: 08/08/2021 14:11   CT  Cervical Spine Wo Contrast  Result Date: 08/08/2021 CLINICAL DATA:  Found on floor by family. Personal history of esophageal cancer. Feeding tube. Mental status change. Hypoxia. EXAM: CT HEAD WITHOUT CONTRAST CT CERVICAL SPINE WITHOUT CONTRAST TECHNIQUE: Multidetector CT imaging of the head and cervical spine was performed following the standard protocol without intravenous contrast. Multiplanar CT image reconstructions of the cervical spine were also generated. COMPARISON:  MRI had without and with contrast 04/10/2021 FINDINGS: CT HEAD FINDINGS Brain: No acute infarct, hemorrhage, or mass lesion is present. No significant white matter lesions are present. The ventricles are of normal size. No significant extraaxial fluid collection is present. The brainstem and cerebellum are within normal limits. Vascular: No hyperdense vessel or unexpected calcification. Skull: Right supraorbital soft tissue swelling and hematoma is present. No underlying fracture is present. Calvarium otherwise within normal limits. No other significant extracranial soft tissue injury is present. Sinuses/Orbits: Mild mucosal thickening is present in the maxillary sinuses bilaterally. No air-fluid levels are present. The globes and orbits are within normal limits. CT CERVICAL SPINE FINDINGS Alignment: No significant listhesis is present. Cervical lordosis preserved. Skull base and vertebrae: Craniocervical junction is within normal limits. Vertebral body heights are normal. Narrow acute or healing fractures are present. Soft tissues and spinal canal: No prevertebral fluid or swelling. No visible canal hematoma. Esophageal mass the thoracic inlet again noted. Edema along the right vocal cord improved. Right IJ Port-A-Cath in place. Disc levels: Uncovertebral spurring leads to moderate foraminal narrowing at C3-4 and C5-6, left greater than right. Upper chest: Lung apices are clear. IMPRESSION: 1. Right supraorbital soft tissue swelling and  hematoma without underlying fracture. 2. Normal CT appearance of the brain. 3. No acute fracture or traumatic subluxation in the cervical spine. 4. Multilevel degenerative changes of the cervical spine as described. Electronically Signed   By: San Morelle M.D.   On: 08/08/2021 13:54   MR BRAIN WO CONTRAST  Result Date: 08/11/2021 CLINICAL DATA:  Initial evaluation for neuro deficit, stroke suspected, cervical radiculopathy. EXAM: MRI HEAD WITHOUT CONTRAST TECHNIQUE: Multiplanar, multiecho pulse sequences of the brain and surrounding structures were obtained without intravenous contrast. COMPARISON:  Prior study from 08/08/2021. FINDINGS: MRI HEAD FINDINGS Brain: Cerebral volume within normal limits. No significant cerebral white matter disease for age. There are 2 subtle adjacent punctate foci of diffusion abnormality involving the subcortical aspect of the posterior right frontal region (series 5, images 36, 35), suspicious for tiny acute to early subacute ischemic infarcts. No associated hemorrhage or mass effect. No other diffusion abnormality to suggest acute or subacute ischemia. Gray-white matter differentiation otherwise maintained. No encephalomalacia to suggest chronic cortical infarction elsewhere within the brain. No other evidence for acute or chronic intracranial hemorrhage. No mass lesion, midline shift or mass effect. No hydrocephalus or extra-axial fluid collection. Pituitary gland and suprasellar region within normal limits. Midline structures intact. Vascular: There is asymmetric FLAIR signal with intermediate T1 signal intensity involving the right transverse and sigmoid sinus (series 15, image 14). While this finding is favored to reflect slow/sluggish flow, possible thrombus is difficult to exclude. Major intracranial vascular flow voids are  otherwise maintained. Skull and upper cervical spine: Craniocervical junction within normal limits. Bone marrow signal intensity normal. No  focal marrow replacing lesion. No scalp soft tissue abnormality. Sinuses/Orbits: Globes and orbital soft tissues within normal limits. Mild scattered mucosal thickening noted within the ethmoidal air cells and maxillary sinuses. Paranasal sinuses are otherwise clear. Small right mastoid effusion noted, of doubtful significance. Inner ear structures grossly normal. Other: None. IMPRESSION: 1. Two subtle adjacent punctate foci of diffusion abnormality involving the subcortical aspect of the posterior right frontal region, suspicious for tiny acute to early subacute ischemic infarcts. No associated hemorrhage or mass effect. 2. Asymmetric FLAIR signal intensity involving the right transverse and sigmoid sinus. While this finding is favored to reflect slow/sluggish flow, possible thrombus is difficult to exclude. Further assessment with dedicated MRV, with and without contrast, suggested for further evaluation. 3. Otherwise normal brain MRI for age. Electronically Signed   By: Jeannine Boga M.D.   On: 08/11/2021 00:53   MR CHEST W WO CONTRAST  Result Date: 08/12/2021 CLINICAL DATA:  Progressive right upper extremity weakness. 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 EXAM: MR CHEST WITH AND WITHOUT CONTRAST TECHNIQUE: Multiplanar, multisequence MR imaging of the right brachial plexus was performed before and after the administration of intravenous contrast. CONTRAST:  7.8m GADAVIST GADOBUTROL 1 MMOL/ML IV SOLN COMPARISON:  CTA head and neck from yesterday. FINDINGS: Spinal cord Normal caliber and signal of visualized spinal cord. Brachial plexus There is diffusely increased T2 signal and mild enhancement involving the right brachial plexus roots, trunks, divisions, cords, and branches. Muscles and tendons Incompletely evaluated mild edema involving the right rotator cuff muscles. Bones Please see separate MRI cervical spine reports from today and yesterday. Other findings Unchanged  moderate right pleural effusion. Similar right paraesophageal nodal conglomerate at the thoracic inlet. Edematous glottic and supraglottic larynx consistent with post radiation change. Similar layering secretions in the hypopharynx. Non-enhancing soft tissue thickening surrounding the right common carotid artery, also consistent with post treatment change. IMPRESSION: 1. 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 2. Similar right paraesophageal nodal conglomerate at the thoracic inlet. Similar post treatment changes in the larynx and surrounding the right common carotid artery. 3. Unchanged moderate right pleural effusion. Electronically Signed   By: WTitus DubinM.D.   On: 08/12/2021 20:15   MR CERVICAL SPINE WO CONTRAST  Result Date: 08/11/2021 CLINICAL DATA:  Initial evaluation for cervical radiculopathy. History of oropharyngeal squamous cell carcinoma. EXAM: MRI CERVICAL SPINE WITHOUT CONTRAST TECHNIQUE: Multiplanar, multisequence MR imaging of the cervical spine was performed. No intravenous contrast was administered. COMPARISON:  Prior CT of the neck from 01/05/2021. FINDINGS: Alignment: Straightening of the normal cervical lordosis. No listhesis. Vertebrae: Vertebral body height maintained without acute or chronic fracture. Suspected postradiation changes with possible fatty marrow conversion within the visualized osseous structures. Mild marrow edema involving the anterior aspects of the C4 through C7 vertebral bodies could be post treatment related as well. No discrete or worrisome osseous lesions. Facet arthrosis with associated marrow edema present about the left C3-4 facet, which could contribute to neck pain. Cord: Normal signal and morphology. Posterior Fossa, vertebral arteries, paraspinal tissues: Craniocervical junction within normal limits. Irregular appearance of the visualized hypopharynx with layering retropharyngeal effusion and right paraesophageal nodal  conglomerate, grossly similar to prior neck CT. Normal flow voids seen within the vertebral arteries bilaterally. Disc levels: C2-C3: Mild left eccentric disc bulge with left-sided uncinate spurring. Mild left-sided facet hypertrophy. No spinal stenosis. Foramina  remain patent. C3-C4: Broad-based posterior disc osteophyte complex flattens and partially effaces the ventral thecal sac, asymmetric to the left. Mild left-sided spinal stenosis without cord impingement. Superimposed moderate left-sided facet degeneration. Moderate left with mild right C4 foraminal narrowing. C4-C5: Mild disc bulge with uncovertebral spurring. Mild left greater than right facet hypertrophy. No spinal stenosis. Mild bilateral C5 foraminal narrowing. C5-C6: Broad-based disc osteophyte complex flattens and partially effaces the ventral thecal sac, asymmetric to the right. Mild spinal stenosis with mild flattening of the ventral cord, but no cord signal changes. Severe right worse than left C6 foraminal narrowing. C6-C7: Minimal disc bulge with bilateral uncovertebral spurring. No spinal stenosis. Superimposed mild left-sided facet hypertrophy. Mild bilateral C7 foraminal narrowing. C7-T1: Negative interspace. Mild left-sided facet hypertrophy. No stenosis. Visualized upper thoracic spine demonstrates no significant finding. IMPRESSION: 1. Degenerative disc osteophyte at C5-6 with resultant mild spinal stenosis, with severe bilateral C6 foraminal narrowing. 2. Left eccentric disc osteophyte and facet arthrosis at C3-4 with resultant mild left-sided spinal stenosis, with moderate left C4 foraminal narrowing. 3. More mild multilevel spondylosis elsewhere within the cervical spine as above. No other significant stenosis or neural impingement. 4. Post treatment changes within the visualized neck with associated right paraesophageal nodal conglomerate, partially visualized, but grossly similar to previous neck CT from 01/05/2021. Electronically  Signed   By: Jeannine Boga M.D.   On: 08/11/2021 01:10   MR CERVICAL SPINE W CONTRAST  Result Date: 08/12/2021 CLINICAL DATA:  Head and neck cancer, known C-spine involvement EXAM: MRI CERVICAL SPINE WITH CONTRAST TECHNIQUE: Multiplanar, multisequence MR imaging of the cervical spine was performed following the administration of intravenous contrast. COMPARISON:  08/10/2021 MRI cervical spine without contrast, 05/31/2021 MRI cervical spine with and without contrast. Correlation is also made with CTA head neck 08/11/2021 FINDINGS: Alignment: Physiologic. Vertebrae: Diffuse fatty replacement of the bone marrow secondary to prior radiation in the neck. Redemonstrated small areas of enhancement at the anterior aspect of C5, C6, C7, and T1, which appear overall similar to the prior exam. No acute fracture. Cord: No abnormal enhancement. Posterior Fossa, vertebral arteries, paraspinal tissues: Mucosal thickening and enhancement in the esophagus, with a nonenhancing fluid collection at the right aspect. Posterior fossa is unremarkable. Disc levels: Please see previous MRI of the cervical spine 08/10/2021. IMPRESSION: Redemonstrated small areas of enhancement at the anterior aspects of C5, C6, C7, and T1, which appear overall similar to the prior exam. No new foci of abnormal osseous enhancement. No evidence of cord enhancement. Electronically Signed   By: Merilyn Baba M.D.   On: 08/12/2021 17:19   CT ABDOMEN PELVIS W CONTRAST  Result Date: 08/08/2021 CLINICAL DATA:  History of metastatic oropharyngeal squamous carcinoma. Found on floor at home and hypoxic. Elevated white blood cell count, D-dimer and lipase. EXAM: CT ABDOMEN AND PELVIS WITH CONTRAST TECHNIQUE: Multidetector CT imaging of the abdomen and pelvis was performed using the standard protocol following bolus administration of intravenous contrast. CONTRAST:  57m OMNIPAQUE IOHEXOL 350 MG/ML SOLN COMPARISON:  CT of the abdomen without contrast on  09/15/2020 and PET scan on 11/09/2020 FINDINGS: Hepatobiliary: Stable left lobe hepatic cyst. Gallbladder and bile ducts are unremarkable. Pancreas: Unremarkable. No pancreatic ductal dilatation or surrounding inflammatory changes. Spleen: Normal in size without focal abnormality. Adrenals/Urinary Tract: Stable right renal cyst. No hydronephrosis or calculi. No solid renal masses or adrenal masses. The bladder is unremarkable. Stomach/Bowel: Gastrostomy tube present with normal positioning within the lumen of the stomach. No evidence of bowel obstruction, ileus  or inflammation. No mass lesions are seen involving the bowel. Diverticulosis of the descending and sigmoid colon without evidence of diverticulitis. No free intraperitoneal air. Vascular/Lymphatic: No significant vascular findings are present. No enlarged abdominal or pelvic lymph nodes. Reproductive: Prostate is unremarkable. Other: No abdominal wall hernia or abnormality. No abdominopelvic ascites. Musculoskeletal: No acute or significant osseous findings. IMPRESSION: No acute findings in the abdomen or pelvis. Gastrostomy tube is present and in appropriate position. Electronically Signed   By: Aletta Edouard M.D.   On: 08/08/2021 14:22   DG Hand 2 View Right  Result Date: 08/10/2021 CLINICAL DATA:  Hand and wrist soreness and swelling EXAM: RIGHT WRIST - 2 VIEW; RIGHT HAND - 2 VIEW COMPARISON:  None. FINDINGS: Wrist: There is no acute fracture or dislocation. There is minimal positive ulnar variance. Alignment is otherwise normal. The joint spaces are preserved. The soft tissues are unremarkable. Hand: There is no acute fracture or dislocation. Alignment is normal. There is degenerative change at the thumb MCP joint. The joint spaces are otherwise preserved. The soft tissues are unremarkable. IMPRESSION: Mild degenerative change at the thumb MCP joint. Otherwise, unremarkable hand and wrist radiographs. Electronically Signed   By: Valetta Mole M.D.    On: 08/10/2021 13:33   DG Chest Portable 1 View  Result Date: 08/08/2021 CLINICAL DATA:  Low O2 saturation EXAM: PORTABLE CHEST 1 VIEW COMPARISON:  None FINDINGS: There is a chest port with catheter tip overlying the right atrium. Unchanged, enlarged cardiac silhouette. There are low lung volumes with bibasilar atelectasis and possible small effusions. Nodular opacities in the right lung consistent with known pulmonary nodules. No new focal airspace disease. No visible pneumothorax. Bilateral shoulder degenerative changes. No acute osseous abnormality. Thoracic spondylosis. IMPRESSION: Low lung volumes with bibasilar subsegmental atelectasis and probable small bilateral pleural effusions. Electronically Signed   By: Maurine Simmering M.D.   On: 08/08/2021 09:54   ECHOCARDIOGRAM COMPLETE  Result Date: 08/12/2021    ECHOCARDIOGRAM REPORT   Patient Name:   [facility] Date of Exam: 08/12/2021 Medical Rec #:  [id number]              Height:       70.0 in Accession #:    [id number]             Weight:       189.2 lb Date of Birth:  1956-07-07              BSA:          2.038 m Patient Age:    31 years               BP:           148/100 mmHg Patient Gender: M                      HR:           102 bpm. Exam Location:  [facility] Procedure: 2D Echo, Color Doppler and Cardiac Doppler Indications:     I63.9 Stroke  History:         Patient has no prior history of Echocardiogram examinations.                  Risk Factors:Hypertension and Dyslipidemia.  Sonographer:     Charmayne Sheer Referring Phys:  [id number] Finlay Godbee Diagnosing Phys: Yolonda Kida MD  Sonographer Comments: Technically challenging study due to limited acoustic windows. Image acquisition challenging  due to patient body habitus and Image acquisition challenging due to respiratory motion. IMPRESSIONS  1. Left ventricular ejection fraction, by estimation, is 40 to 45%. The left ventricle has mildly decreased function. The left ventricle demonstrates  global hypokinesis. Left ventricular diastolic parameters are consistent with Grade II diastolic dysfunction (pseudonormalization).  2. Right ventricular systolic function is normal. The right ventricular size is moderately enlarged.  3. The mitral valve is normal in structure. Trivial mitral valve regurgitation.  4. The aortic valve is grossly normal. Aortic valve regurgitation is not visualized. FINDINGS  Left Ventricle: Left ventricular ejection fraction, by estimation, is 40 to 45%. The left ventricle has mildly decreased function. The left ventricle demonstrates global hypokinesis. The left ventricular internal cavity size was normal in size. There is  no left ventricular hypertrophy. Left ventricular diastolic parameters are consistent with Grade II diastolic dysfunction (pseudonormalization). Right Ventricle: The right ventricular size is moderately enlarged. No increase in right ventricular wall thickness. Right ventricular systolic function is normal. Left Atrium: Left atrial size was normal in size. Right Atrium: Right atrial size was normal in size. Pericardium: There is no evidence of pericardial effusion. Mitral Valve: The mitral valve is normal in structure. Trivial mitral valve regurgitation. MV peak gradient, 3.9 mmHg. The mean mitral valve gradient is 2.0 mmHg. Tricuspid Valve: The tricuspid valve is grossly normal. Tricuspid valve regurgitation is mild. Aortic Valve: The aortic valve is grossly normal. Aortic valve regurgitation is not visualized. Aortic valve mean gradient measures 2.0 mmHg. Aortic valve peak gradient measures 3.7 mmHg. Aortic valve area, by VTI measures 3.38 cm. Pulmonic Valve: The pulmonic valve was grossly normal. Pulmonic valve regurgitation is not visualized. Aorta: The ascending aorta was not well visualized. IAS/Shunts: No atrial level shunt detected by color flow Doppler.  LEFT VENTRICLE PLAX 2D LVIDd:         4.30 cm  Diastology LVIDs:         3.50 cm  LV e' medial:     5.22 cm/s LV PW:         1.00 cm  LV E/e' medial:  9.8 LV IVS:        0.70 cm  LV e' lateral:   7.72 cm/s LVOT diam:     2.20 cm  LV E/e' lateral: 6.7 LV SV:         52 LV SV Index:   25 LVOT Area:     3.80 cm  LEFT ATRIUM           Index LA diam:      3.10 cm 1.52 cm/m LA Vol (A4C): 25.8 ml 12.66 ml/m  AORTIC VALVE                   PULMONIC VALVE AV Area (Vmax):    3.35 cm    PV Vmax:       0.82 m/s AV Area (Vmean):   3.45 cm    PV Vmean:      56.400 cm/s AV Area (VTI):     3.38 cm    PV VTI:        0.101 m AV Vmax:           96.30 cm/s  PV Peak grad:  2.7 mmHg AV Vmean:          65.700 cm/s PV Mean grad:  1.0 mmHg AV VTI:            0.153 m AV Peak Grad:  3.7 mmHg AV Mean Grad:      2.0 mmHg LVOT Vmax:         84.90 cm/s LVOT Vmean:        59.700 cm/s LVOT VTI:          0.136 m LVOT/AV VTI ratio: 0.89  AORTA Ao Root diam: 3.70 cm MITRAL VALVE MV Area (PHT): 9.37 cm    SHUNTS MV Area VTI:   3.83 cm    Systemic VTI:  0.14 m MV Peak grad:  3.9 mmHg    Systemic Diam: 2.20 cm MV Mean grad:  2.0 mmHg MV Vmax:       0.99 m/s MV Vmean:      58.9 cm/s MV Decel Time: 81 msec MV E velocity: 51.40 cm/s MV A velocity: 75.80 cm/s MV E/A ratio:  0.68 Dwayne D Callwood MD Electronically signed by Yolonda Kida MD Signature Date/Time: 08/12/2021/4:11:53 PM    Final    CT VENOGRAM HEAD  Result Date: 08/12/2021 CLINICAL DATA:  Follow-up examination for acute stroke, possible dural sinus thrombosis. EXAM: CT ANGIOGRAPHY HEAD AND NECK CT VENOGRAM HEAD TECHNIQUE: Multidetector CT imaging of the head and neck was performed using the standard protocol during bolus administration of intravenous contrast. Multiplanar CT image reconstructions and MIPs were obtained to evaluate the vascular anatomy. Carotid stenosis measurements (when applicable) are obtained utilizing NASCET criteria, using the distal internal carotid diameter as the denominator. Dedicated CT imaging of the dural venous sinuses using the standard protocol  following the administration of IV contrast. CONTRAST:  43m OMNIPAQUE IOHEXOL 350 MG/ML SOLN COMPARISON:  Comparison made with previous MRI from 08/10/2021. FINDINGS: CT HEAD FINDINGS Brain: Cerebral volume within normal limits. Previously identified punctate right frontal infarcts not visible by CT. No other acute large vessel territory infarct. No intracranial hemorrhage. No mass lesion, mass effect or midline shift. No hydrocephalus or extra-axial fluid collection. Vascular: No hyperdense vessel. Skull: Scalp soft tissues and calvarium within normal limits. Sinuses: Scattered mucosal thickening within the ethmoidal air cells and maxillary sinuses. Trace chronic right mastoid effusion. Orbits: Globes and orbital soft tissues within normal limits. Review of the MIP images confirms the above findings CTA NECK FINDINGS Aortic arch: Visualized aortic arch normal in caliber with normal 3 vessel morphology. Minor for age atheromatous change along the undersurface of the aortic arch. No stenosis about the origin of the great vessels. Right carotid system: Right CCA widely patent without stenosis. Ill-defined soft tissue density partially surrounding the proximal and mid right CCA consistent with post treatment changes. Mild for age atheromatous change about the right carotid bulb and proximal right ICA without significant stenosis. Right ICA widely patent distally without stenosis dissection or occlusion. Left carotid system: Left CCA patent from its origin to the bifurcation without stenosis. Mild for age eccentric mixed plaque at the proximal left ICA without significant stenosis. Left ICA patent distally without stenosis, dissection or occlusion. Vertebral arteries: Both vertebral arteries arise from the subclavian arteries. No proximal subclavian artery stenosis. Similarly, ill-defined soft tissue partially surrounding the proximal right vertebral artery consistent with post treatment changes. Vertebral arteries  widely patent without stenosis, dissection or occlusion. Skeleton: No visible acute osseous finding. No discrete or worrisome osseous lesions. Torus mandibularis noted. Other neck: Post treatment changes seen within the mid and lower right neck. Mucosal edema within the right greater than left glottic and supraglottic larynx likely reflects post radiation changes. Layering secretions noted within the hypopharynx. Scattered soft tissue stranding within the right  anterolateral neck and retropharyngeal space also consistent with post radiation changes. Patient's known ill-defined nodal conglomerate just to the right of the esophagus at the thoracic inlet again seen, somewhat difficult to measure, but grossly similar from previous. Possible invasion of the adjacent esophagus which demonstrates irregular ill-defined wall thickening again noted. Changes are relatively similar to prior neck CT from 04/01/2021. Upper chest: Right greater than left layering pleural effusions partially visualized. Associated atelectasis and/or consolidation at the partially visualized right posterior lung. 1.7 cm nodule present at the posterior right upper lobe, measuring slightly larger as compared to recent chest CT from 08/08/2021. Underlying emphysematous changes noted. 1.6 cm precarinal lymph node noted, indeterminate (series 9, image 1). Review of the MIP images confirms the above findings CTA HEAD FINDINGS Anterior circulation: Both internal carotid arteries widely patent to the termini without stenosis. 6 mm focal outpouching arising from the cavernous left ICA consistent with aneurysm (series 11, image 252). This projects laterally. A1 segments patent bilaterally. Normal anterior communicating artery complex. Anterior cerebral arteries patent to their distal aspects without stenosis. No M1 stenosis or occlusion. Normal MCA bifurcations. Distal MCA branches well perfused and symmetric. Posterior circulation: Both V4 segments patent to  the vertebrobasilar junction without stenosis. Both PICA origins patent and normal. Basilar widely patent to its distal aspect without stenosis. Superior cerebellar arteries patent bilaterally. Both PCAs primarily supplied via the basilar and are well perfused to there distal aspects. Venous sinuses: Additional delayed venous phase images were performed. Normal enhancement seen throughout the superior sagittal sinus to the level of the torcula. Left transverse and sigmoid sinus is patent as is the visualized left internal jugular vein. Dominant right transverse and sigmoid sinuses demonstrate heterogeneous early enhancement, but are seen to fill and opacify with additional delayed images, consistent with slow but patent flow. Straight sinus, vein of Galen, internal cerebral veins, and basal veins of Rosenthal are patent. Cavernous sinus appears patent without abnormality. Superior orbital veins grossly symmetric and normal. No appreciable cortical vein thrombosis. Anatomic variants: None significant. Review of the MIP images confirms the above findings IMPRESSION: CT HEAD IMPRESSION: Stable head CT. 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 No other acute intracranial abnormality. CTA HEAD AND NECK IMPRESSION: 1. Negative CTA for large vessel occlusion. Mild atheromatous disease for age, with no hemodynamically significant or correctable stenosis. 2. 6 mm cavernous left ICA aneurysm. 3. Post treatment changes within the lower neck with similar size and appearance of ill-defined right paraesophageal nodal mass. Possible invasion of the adjacent esophagus again noted. 4. Right greater than left layering pleural effusions with associated atelectasis and/or consolidation. 5. 1.7 cm right upper lobe nodule, consistent with metastatic disease. This measures slightly larger in size as compared to recent chest CT from 08/08/2021. 6. 1.6 cm precarinal lymph node, indeterminate.  Attention at follow-up recommended. CT VENOGRAM IMPRESSION: 1. No evidence for dural venous sinus thrombosis. 2. Evidence for patent but slow flow within the dominant right transverse and sigmoid sinuses, corresponding with the asymmetric FLAIR signal intensity seen on prior MRI. Electronically Signed   By: Jeannine Boga M.D.   On: 08/12/2021 02:12       Discharge Exam: Vitals:   08/14/21 0029 08/14/21 0438  BP: (!) 137/98 (!) 144/95  Pulse: (!) 102 100  Resp: 18 16  Temp: 98.3 F (36.8 C) 97.6 F (36.4 C)  SpO2: 93% 93%   Vitals:   08/13/21 2000 08/13/21 2242 08/14/21 0029 08/14/21 0438  BP: Marland Kitchen)  156/99 (!) 126/96 (!) 137/98 (!) 144/95  Pulse: (!) 108 (!) 110 (!) 102 100  Resp: '18 18 18 16  ' Temp: 98.9 F (37.2 C) 98.6 F (37 C) 98.3 F (36.8 C) 97.6 F (36.4 C)  TempSrc: Oral Oral Oral Oral  SpO2: 94% 94% 93% 93%  Weight:      Height:           The results of significant diagnostics from this hospitalization (including imaging, microbiology, ancillary and laboratory) are listed below for reference.     Microbiology: Recent Results (from the past 240 hour(s))  Culture, blood (single)     Status: None   Collection Time: 08/08/21 11:09 AM   Specimen: BLOOD  Result Value Ref Range Status   Specimen Description BLOOD RIGHT ANTECUBITAL  Final   Special Requests   Final    BOTTLES DRAWN AEROBIC AND ANAEROBIC Blood Culture adequate volume   Culture   Final    NO GROWTH 5 DAYS Performed at [facility], [address] [place], [place] [address]    Report Status 08/13/2021 FINAL  Final  Resp Panel by RT-PCR (Flu A&B, Covid)     Status: None   Collection Time: 08/08/21 11:13 AM   Specimen: Nasopharyngeal(NP) swabs in vial transport medium  Result Value Ref Range Status   SARS Coronavirus 2 by RT PCR NEGATIVE NEGATIVE Final    Comment: (NOTE) SARS-CoV-2 target nucleic acids are NOT DETECTED.  The SARS-CoV-2 RNA is generally detectable in upper  respiratory specimens during the acute phase of infection. The lowest concentration of SARS-CoV-2 viral copies this assay can detect is 138 copies/mL. A negative result does not preclude SARS-Cov-2 infection and should not be used as the sole basis for treatment or other patient management decisions. A negative result may occur with  improper specimen collection/handling, submission of specimen other than nasopharyngeal swab, presence of viral mutation(s) within the areas targeted by this assay, and inadequate number of viral copies(<138 copies/mL). A negative result must be combined with clinical observations, patient history, and epidemiological information. The expected result is Negative.  Fact Sheet for Patients:  EntrepreneurPulse.com.au  Fact Sheet for Healthcare Providers:  IncredibleEmployment.be  This test is no t yet approved or cleared by the [place] FDA and  has been authorized for detection and/or diagnosis of SARS-CoV-2 by FDA under an Emergency Use Authorization (EUA). This EUA will remain  in effect (meaning this test can be used) for the duration of the COVID-19 declaration under Section 564(b)(1) of the Act, 21 U.S.C.section 360bbb-3(b)(1), unless the authorization is terminated  or revoked sooner.       Influenza A by PCR NEGATIVE NEGATIVE Final   Influenza B by PCR NEGATIVE NEGATIVE Final    Comment: (NOTE) The Xpert Xpress SARS-CoV-2/FLU/RSV plus assay is intended as an aid in the diagnosis of influenza from Nasopharyngeal swab specimens and should not be used as a sole basis for treatment. Nasal washings and aspirates are unacceptable for Xpert Xpress SARS-CoV-2/FLU/RSV testing.  Fact Sheet for Patients: EntrepreneurPulse.com.au  Fact Sheet for Healthcare Providers: IncredibleEmployment.be  This test is not yet approved or cleared by the [place] FDA and has been  authorized for detection and/or diagnosis of SARS-CoV-2 by FDA under an Emergency Use Authorization (EUA). This EUA will remain in effect (meaning this test can be used) for the duration of the COVID-19 declaration under Section 564(b)(1) of the Act, 21 U.S.C. section 360bbb-3(b)(1), unless the authorization is terminated or revoked.  Performed at  [facility], [address] [place], [place] [address]   Culture, blood (routine x 2)     Status: None   Collection Time: 08/08/21 11:31 AM   Specimen: BLOOD  Result Value Ref Range Status   Specimen Description BLOOD  LAV  Final   Special Requests   Final    BOTTLES DRAWN AEROBIC AND ANAEROBIC Blood Culture adequate volume   Culture   Final    NO GROWTH 5 DAYS Performed at [facility], [address] [place], [place] [address]    Report Status 08/13/2021 FINAL  Final     Labs: BNP (last 3 results) Recent Labs    08/08/21 0919  BNP 59.9   Basic Metabolic Panel: Recent Labs  Lab 08/08/21 1335 08/09/21 0610 08/10/21 0533 08/12/21 0642 08/13/21 0630  NA 126* 133* 133* 128* 129*  K 4.4 4.0 4.1 4.5 3.9  CL 95* 99 99 90* 95*  CO2 '24 29 27 28 29  ' GLUCOSE 272* 136* 98 108* 114*  BUN 28* 25* '21 16 18  ' CREATININE 1.44* 0.96 0.70 0.71 0.75  CALCIUM 8.0* 8.7* 8.1* 9.0 8.4*  MG  --  2.5*  --   --   --   PHOS  --  3.8  --   --   --    Liver Function Tests: Recent Labs  Lab 08/08/21 0919  AST 48*  ALT 24  ALKPHOS 76  BILITOT 0.9  PROT 7.3  ALBUMIN 4.0   Recent Labs  Lab 08/08/21 0919  LIPASE 101*   No results for input(s): AMMONIA in the last 168 hours. CBC: Recent Labs  Lab 08/08/21 0919 08/09/21 0610 08/10/21 0533 08/12/21 0642  WBC 19.8* 9.0 9.6 10.2  NEUTROABS 17.7* 8.0* 8.7* 9.1*  HGB 14.6 13.8 12.9* 14.4  HCT 42.6 39.8 37.3* 41.4  MCV 91.6 91.5 91.4 90.2  PLT 390 321 306 335   Cardiac Enzymes: Recent Labs  Lab 08/08/21 0919  CKTOTAL 1,739*   BNP: Invalid input(s):  POCBNP CBG: Recent Labs  Lab 08/13/21 1148 08/13/21 1608 08/13/21 2001 08/14/21 0025 08/14/21 0431  GLUCAP 82 178* 111* 102* 90   D-Dimer No results for input(s): DDIMER in the last 72 hours. Hgb A1c No results for input(s): HGBA1C in the last 72 hours. Lipid Profile Recent Labs    08/12/21 0642  CHOL 168  HDL 68  LDLCALC 85  TRIG 75  CHOLHDL 2.5   Thyroid function studies No results for input(s): TSH, T4TOTAL, T3FREE, THYROIDAB in the last 72 hours.  Invalid input(s): FREET3 Anemia work up No results for input(s): VITAMINB12, FOLATE, FERRITIN, TIBC, IRON, RETICCTPCT in the last 72 hours. Urinalysis    Component Value Date/Time   COLORURINE YELLOW 08/08/2021 1109   APPEARANCEUR CLEAR 08/08/2021 1109   LABSPEC 1.020 08/08/2021 1109   PHURINE 6.0 08/08/2021 1109   GLUCOSEU NEGATIVE 08/08/2021 1109   HGBUR LARGE (A) 08/08/2021 1109   BILIRUBINUR NEGATIVE 08/08/2021 1109   KETONESUR NEGATIVE 08/08/2021 1109   PROTEINUR 100 (A) 08/08/2021 1109   NITRITE NEGATIVE 08/08/2021 1109   LEUKOCYTESUR NEGATIVE 08/08/2021 1109   Sepsis Labs Invalid input(s): PROCALCITONIN,  WBC,  LACTICIDVEN Microbiology Recent Results (from the past 240 hour(s))  Culture, blood (single)     Status: None   Collection Time: 08/08/21 11:09 AM   Specimen: BLOOD  Result Value Ref Range Status   Specimen Description BLOOD RIGHT ANTECUBITAL  Final   Special Requests   Final    BOTTLES DRAWN  AEROBIC AND ANAEROBIC Blood Culture adequate volume   Culture   Final    NO GROWTH 5 DAYS Performed at [facility], [address] [place], [place] [address]    Report Status 08/13/2021 FINAL  Final  Resp Panel by RT-PCR (Flu A&B, Covid)     Status: None   Collection Time: 08/08/21 11:13 AM   Specimen: Nasopharyngeal(NP) swabs in vial transport medium  Result Value Ref Range Status   SARS Coronavirus 2 by RT PCR NEGATIVE NEGATIVE Final    Comment: (NOTE) SARS-CoV-2 target nucleic acids  are NOT DETECTED.  The SARS-CoV-2 RNA is generally detectable in upper respiratory specimens during the acute phase of infection. The lowest concentration of SARS-CoV-2 viral copies this assay can detect is 138 copies/mL. A negative result does not preclude SARS-Cov-2 infection and should not be used as the sole basis for treatment or other patient management decisions. A negative result may occur with  improper specimen collection/handling, submission of specimen other than nasopharyngeal swab, presence of viral mutation(s) within the areas targeted by this assay, and inadequate number of viral copies(<138 copies/mL). A negative result must be combined with clinical observations, patient history, and epidemiological information. The expected result is Negative.  Fact Sheet for Patients:  EntrepreneurPulse.com.au  Fact Sheet for Healthcare Providers:  IncredibleEmployment.be  This test is no t yet approved or cleared by the [place] FDA and  has been authorized for detection and/or diagnosis of SARS-CoV-2 by FDA under an Emergency Use Authorization (EUA). This EUA will remain  in effect (meaning this test can be used) for the duration of the COVID-19 declaration under Section 564(b)(1) of the Act, 21 U.S.C.section 360bbb-3(b)(1), unless the authorization is terminated  or revoked sooner.       Influenza A by PCR NEGATIVE NEGATIVE Final   Influenza B by PCR NEGATIVE NEGATIVE Final    Comment: (NOTE) The Xpert Xpress SARS-CoV-2/FLU/RSV plus assay is intended as an aid in the diagnosis of influenza from Nasopharyngeal swab specimens and should not be used as a sole basis for treatment. Nasal washings and aspirates are unacceptable for Xpert Xpress SARS-CoV-2/FLU/RSV testing.  Fact Sheet for Patients: EntrepreneurPulse.com.au  Fact Sheet for Healthcare Providers: IncredibleEmployment.be  This test is  not yet approved or cleared by the [place] FDA and has been authorized for detection and/or diagnosis of SARS-CoV-2 by FDA under an Emergency Use Authorization (EUA). This EUA will remain in effect (meaning this test can be used) for the duration of the COVID-19 declaration under Section 564(b)(1) of the Act, 21 U.S.C. section 360bbb-3(b)(1), unless the authorization is terminated or revoked.  Performed at [facility], [address] [place], [place] [address]   Culture, blood (routine x 2)     Status: None   Collection Time: 08/08/21 11:31 AM   Specimen: BLOOD  Result Value Ref Range Status   Specimen Description BLOOD  LAV  Final   Special Requests   Final    BOTTLES DRAWN AEROBIC AND ANAEROBIC Blood Culture adequate volume   Culture   Final    NO GROWTH 5 DAYS Performed at [facility], [address], [place], [place] [address]    Report Status 08/13/2021 FINAL  Final    Please note: You were cared for by a hospitalist during your hospital stay. Once you are discharged, your primary care physician will handle any further medical issues. Please note that NO REFILLS for any discharge medications will be authorized once you are discharged, as  it is imperative that you return to your primary care physician (or establish a relationship with a primary care physician if you do not have one) for your post hospital discharge needs so that they can reassess your need for medications and monitor your lab values.    Time coordinating discharge: 40 minutes  SIGNED:   Shelly Coss, MD  [facility] 08/14/2021, 10:36 AM Pager [phone]  If 7PM-7AM, please contact night-coverage [email or web address] Password [email or web address]

## 2021-08-18 ENCOUNTER — Telehealth: Payer: Self-pay | Admitting: Oncology

## 2021-08-18 NOTE — Telephone Encounter (Signed)
Patient left VM with answering service to report that he is "at [facility] with [place]). Patient is scheduled to RTC on 9/19 for lab/MD/[place].   Please advise with recommendation on rescheduling.

## 2021-08-19 NOTE — Telephone Encounter (Signed)
Spoke to patient and he states that he got diagnosed with covid yesterday 9/15 and will be in the hospital for 12 days and he will be discharged to rehab. He will contact office back to r/s appts once he is dc from hospital.

## 2021-08-22 ENCOUNTER — Inpatient Hospital Stay: Payer: Medicare Other

## 2021-08-22 ENCOUNTER — Inpatient Hospital Stay: Payer: Medicare Other | Admitting: Pharmacist

## 2021-08-22 ENCOUNTER — Inpatient Hospital Stay: Payer: Medicare Other | Admitting: Oncology

## 2021-08-29 ENCOUNTER — Ambulatory Visit: Payer: Medicare Other

## 2021-08-29 ENCOUNTER — Other Ambulatory Visit: Payer: Medicare Other

## 2021-08-29 ENCOUNTER — Ambulatory Visit: Payer: Medicare Other | Admitting: Oncology

## 2021-09-06 ENCOUNTER — Telehealth: Payer: Self-pay | Admitting: *Deleted

## 2021-09-06 NOTE — Telephone Encounter (Signed)
Called stating that he needs to come in and see doctor about restarting his [place] and Immunotherapy. States he is in facility ? Coming out Monday. Please contact him regarding appointment if appropriate.

## 2021-09-06 NOTE — Telephone Encounter (Signed)
Please schedule patient for lab/MD/ Beryle Flock next week (after his discharge date) and notify pt of appt.

## 2021-09-14 ENCOUNTER — Encounter: Payer: Self-pay | Admitting: Oncology

## 2021-09-14 ENCOUNTER — Inpatient Hospital Stay: Payer: Medicare Other | Attending: Oncology | Admitting: Oncology

## 2021-09-14 ENCOUNTER — Other Ambulatory Visit: Payer: Self-pay

## 2021-09-14 ENCOUNTER — Inpatient Hospital Stay: Payer: Medicare Other

## 2021-09-14 VITALS — BP 104/79 | HR 109 | Temp 98.3°F | Resp 18 | Wt 182.3 lb

## 2021-09-14 DIAGNOSIS — R1310 Dysphagia, unspecified: Secondary | ICD-10-CM | POA: Diagnosis not present

## 2021-09-14 DIAGNOSIS — Z803 Family history of malignant neoplasm of breast: Secondary | ICD-10-CM | POA: Diagnosis not present

## 2021-09-14 DIAGNOSIS — Z931 Gastrostomy status: Secondary | ICD-10-CM | POA: Insufficient documentation

## 2021-09-14 DIAGNOSIS — Z8051 Family history of malignant neoplasm of kidney: Secondary | ICD-10-CM | POA: Diagnosis not present

## 2021-09-14 DIAGNOSIS — Z79899 Other long term (current) drug therapy: Secondary | ICD-10-CM | POA: Diagnosis not present

## 2021-09-14 DIAGNOSIS — C109 Malignant neoplasm of oropharynx, unspecified: Secondary | ICD-10-CM | POA: Diagnosis present

## 2021-09-14 DIAGNOSIS — G540 Brachial plexus disorders: Secondary | ICD-10-CM | POA: Diagnosis not present

## 2021-09-14 DIAGNOSIS — Z8042 Family history of malignant neoplasm of prostate: Secondary | ICD-10-CM | POA: Insufficient documentation

## 2021-09-14 DIAGNOSIS — Z7189 Other specified counseling: Secondary | ICD-10-CM

## 2021-09-14 DIAGNOSIS — Z95828 Presence of other vascular implants and grafts: Secondary | ICD-10-CM | POA: Diagnosis not present

## 2021-09-14 LAB — TSH: TSH: 31.912 u[IU]/mL — ABNORMAL HIGH (ref 0.350–4.500)

## 2021-09-14 LAB — CBC WITH DIFFERENTIAL/PLATELET
Abs Immature Granulocytes: 0.05 10*3/uL (ref 0.00–0.07)
Basophils Absolute: 0 10*3/uL (ref 0.0–0.1)
Basophils Relative: 0 %
Eosinophils Absolute: 0.1 10*3/uL (ref 0.0–0.5)
Eosinophils Relative: 1 %
HCT: 33.9 % — ABNORMAL LOW (ref 39.0–52.0)
Hemoglobin: 10.8 g/dL — ABNORMAL LOW (ref 13.0–17.0)
Immature Granulocytes: 1 %
Lymphocytes Relative: 2 %
Lymphs Abs: 0.1 10*3/uL — ABNORMAL LOW (ref 0.7–4.0)
MCH: 30.7 pg (ref 26.0–34.0)
MCHC: 31.9 g/dL (ref 30.0–36.0)
MCV: 96.3 fL (ref 80.0–100.0)
Monocytes Absolute: 0.3 10*3/uL (ref 0.1–1.0)
Monocytes Relative: 4 %
Neutro Abs: 6.4 10*3/uL (ref 1.7–7.7)
Neutrophils Relative %: 92 %
Platelets: 283 10*3/uL (ref 150–400)
RBC: 3.52 MIL/uL — ABNORMAL LOW (ref 4.22–5.81)
RDW: 16.7 % — ABNORMAL HIGH (ref 11.5–15.5)
WBC: 6.9 10*3/uL (ref 4.0–10.5)
nRBC: 0 % (ref 0.0–0.2)

## 2021-09-14 LAB — COMPREHENSIVE METABOLIC PANEL
ALT: 27 U/L (ref 0–44)
AST: 16 U/L (ref 15–41)
Albumin: 3.1 g/dL — ABNORMAL LOW (ref 3.5–5.0)
Alkaline Phosphatase: 64 U/L (ref 38–126)
Anion gap: 9 (ref 5–15)
BUN: 25 mg/dL — ABNORMAL HIGH (ref 8–23)
CO2: 31 mmol/L (ref 22–32)
Calcium: 8.2 mg/dL — ABNORMAL LOW (ref 8.9–10.3)
Chloride: 89 mmol/L — ABNORMAL LOW (ref 98–111)
Creatinine, Ser: 0.94 mg/dL (ref 0.61–1.24)
GFR, Estimated: >60 mL/min (ref >60)
Glucose, Bld: 128 mg/dL — ABNORMAL HIGH (ref 70–99)
Potassium: 4.7 mmol/L (ref 3.5–5.1)
Sodium: 129 mmol/L — ABNORMAL LOW (ref 135–145)
Total Bilirubin: 0.3 mg/dL (ref 0.3–1.2)
Total Protein: 6.2 g/dL — ABNORMAL LOW (ref 6.5–8.1)

## 2021-09-14 MED ORDER — HEPARIN SOD (PORK) LOCK FLUSH 100 UNIT/ML IV SOLN
500.0000 [IU] | Freq: Once | INTRAVENOUS | Status: AC
  Administered 2021-09-14: 500 [IU] via INTRAVENOUS
  Filled 2021-09-14: qty 5

## 2021-09-14 MED ORDER — SODIUM CHLORIDE 0.9% FLUSH
10.0000 mL | Freq: Once | INTRAVENOUS | Status: DC
  Filled 2021-09-14: qty 10

## 2021-09-14 NOTE — Progress Notes (Signed)
Pt here for follow up. Pt reports he has lost control of right arm but he still has movement. Pt states he will be in rehab for one more week.

## 2021-09-14 NOTE — Progress Notes (Signed)
Hematology/Oncology  Follow Up note [facility] Telephone:([phone] Fax:([phone]) [phone]   Patient Care Team: Albina Billet, MD as PCP - General (Internal Medicine) Albina Billet, MD (Internal Medicine) Bary Castilla, Forest Gleason, MD (General Surgery) Noreene Filbert, MD as Referring Physician (Radiation Oncology) Earlie Server, MD as Consulting Physician (Oncology)  CHIEF COMPLAINTS/PURPOSE OF CONSULTATION:  Follow up for head and neck cancer.  HISTORY OF PRESENTING ILLNESS:  Ivan Garcia. is a  65 y.o.  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 P16 positive. cT2 cN2 disease, stage II, # 12/13/2017: PET whole body: Primary hypermetabolic mucosal lesion in the left oropharynx/tongue base with right-sided bulky multilevel lymphadenopathy. #12/31/2017. Concurrent ChemoRT [Cisplatin 100 mg/m2 q3weeks]  S/p one dose of Cisplatin. Cisplatin discontinued due to nephrotoxicity/AKI . switch to weekly Carboplatin (AUC 2) / Taxol 72m/m2 [ finished May 2019] # 03/08/2018: PET image skull base to midthigh restaging: Near complete resolution of prior large right neck mass. Small right cervical/supraclavicular nodal metastases measuring up to 10 mm short axis. No evidence of distant metastases.  # 07/02/2018 PET No evidence of residual carcinoma in the neck. No evidence of nodal metastasis. No evidence distant metastatic disease  # July 2020, he started to have hoarseness after singing for 2 to 3 hours. He has changed his insurance and Dr. MTami Ribasis no longer covered by his current insurance.  08/04/2019 establish care with Dr.Alek Poncedeleon [place] . Flexible laryngoscopy showed Vocal cord paralysis.  Patient was recommended to have a PET scan done for restaging. 08/20/2019 PET scan showed asymmetric hypermetabolic  involving the  left vocal cords with hypermetabolic adenopathy in the neck, and the chest.  He also has hypermetabolic pulmonary nodule along the minor fissure which is felt to be metastatic.  # 09/08/2019   ultrasound-guided right supraclavicular lymph node biopsy Pathology was positive for metastatic squamous cell carcinoma. # 09/08/2019 Omniseq NGS showed PD-L1 CPS 20, TMB indeterminant, MSI stable,PIK3CA [id number] # 09/29/2019- 01/2020  6 cycles of carboplatin, 5-FU and Keytruda, finished in February 2021.  #12/18/2019, PET/CT: marked improvement in met disease, continued on Keytruda maintenance. # 04/26/2020 PET worsening of disease in the chest.  Interval enlargement of the right thoracic inlet and paratracheal lymph nodes that were previously enlarged with new activity in the right hilum corresponding to a lymph node in this location.  Also increased activity in the right upper lobe nodule. Right level 3 lymph node in the neck and anterior mediastinal nodal activity is diminished.  These areas are quite small on prior study. Diminished symmetric uptake within the left as compared to the right vocal cord. # [facility].  Patient was offered for clinical trial with pembrolizumab/lenvatinib versus standard therapy.  05/14/2021 Patient received additional 1 cycle of Keytruda while waiting to be enrolled to the trial.   #06/24/2020, [facility] right upper paratracheal node is concerning for metastatic disease and a new from 01/20/2020 PET/CT.  The node closely abuts and possibly invade the posterolateral right tracheal wall as well as the esophagus.  Right vocal cord paralysis.  Symmetric soft tissue density at the right base of tongue could reflect residual/recurrent primary malignancy versus posttreatment changes.  Right supraclavicular lymph node is no longer appreciated  with post treatment changes.  There is  increased size of right minor fissure nodule which was metabolically avid on previous PET scan.  New mediastinal and hilar lymphadenopathy concerning for new nodal disease.  No evidence of metastatic disease below the diaphragm.  Nonobstructing 3 mm distal ureter stone.- Disease progression.  Not eligible for the clinical trial at [facility]  # 07/07/2020 09/02/2020 palliative chemotherapy with 3 cycles of docetaxel  with G-CSF support.   # 09/02/2020,chest without contrast with 3D MIPS protocol showed Interval increase in the size of the right upper lobe perifissural nodule 1.6 x 1.5 cm-previously 1.4 x 1.2 cm tracheoesophageal soft tissue mass 3.4 x 5.0 cm with regional mass-effect on the esophagus-previously 2.4 x 3.6 cm, and mediastinal lymphadenopathy-right upper paratracheal lymph node 1.9 cm-previously 1.5 cm, suspicious for progressive metastatic disease.Subcentimeter lung nodules are stable. CT neck with contrast showed slight interval increase in size of upper right paratracheal nodal conglomerate.  Questionable soft tissue invasion of the posterior tracheal appears worsened.  Compression with potential invasion of the esophagus is again noted.  Prior vocal cord paralysis again noted.  Unchanged right tongue base asymmetry. I had a discussion with patient's [facility] oncologist Dr. Barrington Ellison over the phone. Dr.Choe recommended adding carboplatin AUC 5 to docetaxel regimen for now which hopefully to help to reduce the size of the tracheoesophageal soft tissue mass.  Also recommend palliative radiation. # 09/07/2020, carboplatin AUC 5+ docetaxel # 10/21/2021S/p PEG tube placement   #09/28/2020- 10/25/20 patient finished palliative radiation to esophageal mass. #Patient establish care with [facility] Dr. Maxie Better   09/29/2020  guardant 360 which showed PIK3CA E545K, PIK3CA amplification. MSI stable, APC E2172K VUS , APC L2138L, FGFR2 R573 VUS   . # 11/09/2020, patient had a PET scan which showed partial response Right  thoracic inlet Hypermetabolic soft tissue metastasis, 3.9 x 2.6cm, decreased in size, decreased size of  Hypermetabolic right paratracheal 1.2cm, and right hilar nodal Metastases since outside 09/02/2020 neck CT Right upper lobe lung nodule 1.2cm, stable size.  RadOnc Dr. Baruch Gouty recommend additional radiation.  #11/23/20-12/17/19 patient finished additional radiation # 12/22/2020,Piqray 330m daily  discontinued on 03/31/2021 04/01/2021 CT Neck/Chest abdomen pelvis  Progressive enlargement of the right larynx and supraglottic tissues with low-density most compatible with edema from radiation. Correlate with direct visualization mucosa to evaluate for tumor recurrence in this area Right paraesophageal lymph node mass is ill-defined and unchanged in size. Possible invasion of the esophagus.  # [facility] 1 on 05/04/2021.  Unfortunately patient developed grade 4 infusion related reaction, syncope while on toilet which progressed to cardiopulmonary arrest requiring CPR for 30 to 60 seconds until ROSC, epinephrine, sent to emergency room and he was admitted for 24 hours.  He has sinus tachycardia and had a TEE done which normal LVEF.  [facility]  Patient was seen by Dr.Lycan on 05/13/2021, was recommended to allow cetuximab washout and also recently being informed that he is not eligible for another clinical trial at this point. Patient was also informed that he was not eligible for another clinical try at this point.  #[facility]20[facility] Dr. W[facility]iel Kansky  Several options were discussed. First-in-human Study of DRP-104 (Sirpiglenastat) as Single Agent and in Combination With [employer] Alternative approaches was desensitization to cetuximab in a controlled intensity monitor for any environment.  Keytruda and lenvatinib, based current promising OR ranging from 28  to 36% and DFS 4-8  months. [Journal of Clinical Oncology 36, no. 15_suppl (Apr 22, 2017) 6016-6016], [employer] 2021 Apr ;84(4):361-367]. Patient is interested in proceeding with Keytruda and lenvatinib locally at [facility].  INTERVAL HISTORY Ivan [place] Tylee Newby. is a 65 y.o. male who presents for follow-up for metastatic squamous cell carcinoma, P 16+.    08/01/2021 Patient was started on Keytruda and lenvatinib. 08/08/2021 - 08/14/2021, patient was admitted to [facility] due to multilobar aspiration pneumonia.  Treated with antibiotics.  He developed acute on chronic weakness of right upper extremity.  MRI of the brain showed acute/subacute ischemic infarct of the right frontal lobe. 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  Not able to distinguish the etiology as opposed to radiation effect versus secondary to infiltrative malignancy, or medication induced side effects..  Patient was seen by neurology and was felt that EMG/nerve conduction study can be useful in these cases as a finding of myokymia would support a diagnosis of post radiation effect.   In order to preserve the function of his dominant right hand as much as possible and potentially even reverse some of his weakness, accurately determine the etiology of the brachial plexitis is critical.  It was felt that patient needs to be transferred to higher level of care where EMG/nerve conduction study could be done inpatient.  [facility] has no bed availability patient was transferred to [facility] 08/15/2021, EMG at [facility] marked abnormality, electrodiagnostic evidence of acute right upper trunk brachial plexopathy.  There is no myotonic discharges to suggest radiation-induced plexopathy.  It was felt that this is likely a combination of radiation/chemotherapy/checkpoint inhibitor side effects.  Given the inflammation on imaging, patient was started on steroid tapering course.  [facility]  Patient's current Is currently on on 5 mg  of prednisone daily to complete steroid on 09/18/2021.  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 CHF, LVEF 40-45%, he was started on metoprolol,empagliflozin   -08/08/2021, CT chest PE protocol showed bilateral aspiration pneumonia.  Progression of metastatic disease in the right lung with enlargement of superior right upper lobe and inferior right upper lobe nodule.  Similar appearance of the retrotracheal tumor at the level of the thoracic inlet.  Increased prominence of left axillary lymph node -06/16/2021, patient had a PET scan at [place] are available in [place]. I spoke with radiology Dr. Nelia Shi.  Comparing to his PET scan in July 2022, patient has had a disease progression in his lungs nodules.   There was no uptake activities in his right brachial plexus.  Lenvatinib was held during his admission.  Today patient presented to follow-up.  Right upper extremity strength is slightly better.    Review of Systems  Constitutional:  Negative for chills, fever, malaise/fatigue and weight loss.  HENT:  Negative for sore throat.        Dysphagia   Eyes:  Negative for redness.  Respiratory:  Negative for cough, shortness of breath and wheezing.   Cardiovascular:  Negative for chest pain, palpitations and leg swelling.  Gastrointestinal:  Negative for abdominal pain, blood in stool, nausea and vomiting.  Genitourinary:  Negative for dysuria.  Musculoskeletal:  Negative for myalgias.       Right upper extremity weakness  Skin:  Negative for rash.  Neurological:  Negative for dizziness, tingling and tremors.  Endo/Heme/Allergies:  Does not bruise/bleed easily.  Psychiatric/Behavioral:  Negative for hallucinations.    MEDICAL  HISTORY:  Past Medical History:  Diagnosis Date   Arthritis    Cancer ([place])    Head and neck cancer   Hemorrhoids    Hyperlipidemia    Hypertension     SURGICAL HISTORY: Past Surgical History:  Procedure  Laterality Date   COLONOSCOPY WITH PROPOFOL N/A 04/04/2017   Procedure: COLONOSCOPY WITH PROPOFOL;  Surgeon: Robert Bellow, MD;  Location: [facility] ENDOSCOPY;  Service: Endoscopy;  Laterality: N/A;   IR GASTROSTOMY TUBE MOD SED  09/24/2020   MANDIBLE SURGERY     PORT-A-CATH REMOVAL  07/17/2018   PORTA CATH INSERTION N/A 12/19/2017   Procedure: PORTA CATH INSERTION;  Surgeon: Algernon Huxley, MD;  Location: [place] CV LAB;  Service: Cardiovascular;  Laterality: N/A;   PORTA CATH INSERTION N/A 07/17/2018   Procedure: PORTA CATH INSERTION;  Surgeon: Algernon Huxley, MD;  Location: [place] CV LAB;  Service: Cardiovascular;  Laterality: N/A;   PORTA CATH INSERTION N/A 09/18/2019   Procedure: PORTA CATH INSERTION;  Surgeon: Algernon Huxley, MD;  Location: [place] CV LAB;  Service: Cardiovascular;  Laterality: N/A;   TONSILLECTOMY      SOCIAL HISTORY: Social History   Socioeconomic History   Marital status: Single    Spouse name: Not on file   Number of children: Not on file   Years of education: Not on file   Highest education level: Not on file  Occupational History   Not on file  Tobacco Use   Smoking status: Never   Smokeless tobacco: Never  Vaping Use   Vaping Use: Never used  Substance and Sexual Activity   Alcohol use: No   Drug use: No   Sexual activity: Not on file  Other Topics Concern   Not on file  Social History Narrative   Not on file   Social Determinants of Health   Financial Resource Strain: Not on file  Food Insecurity: Not on file  Transportation Needs: Not on file  Physical Activity: Not on file  Stress: Not on file  Social Connections: Not on file  Intimate Partner Violence: Not on file    FAMILY HISTORY: Family History  Problem Relation Age of Onset   Breast cancer Mother    Asthma Mother    Congestive Heart Failure Mother    Prostate cancer Father    Brain cancer Father    Bladder Cancer Father     ALLERGIES:  is allergic to  cetuximab.  MEDICATIONS:  Current Outpatient Medications  Medication Sig Dispense Refill   acetaminophen (TYLENOL) 160 MG/5ML solution Take by mouth.     ALPRAZolam (XANAX) 0.25 MG tablet Place 1 tablet (0.25 mg total) into feeding tube at bedtime as needed for anxiety. 30 tablet 0   amLODipine (NORVASC) 5 MG tablet Take 5 mg by mouth daily.     aspirin 81 MG chewable tablet Place 1 tablet (81 mg total) into feeding tube daily. 30 tablet 0   atorvastatin (LIPITOR) 40 MG tablet Place 1 tablet (40 mg total) into feeding tube daily. 30 tablet 0   cetirizine (ZYRTEC) 10 MG tablet Take by mouth.     famotidine (PEPCID) 20 MG tablet Take by mouth.     gabapentin (NEURONTIN) 100 MG capsule TAKE 2 CAPSULES BY MOUTH 3 TIMES DAILY.     HYDROcodone-acetaminophen (NORCO) 10-325 MG tablet Take 1 tablet by mouth every 6 (six) hours as needed. 60 tablet 0   ibuprofen (ADVIL) 100 MG/5ML suspension Place 30 mLs (600 mg  total) into feeding tube every 6 (six) hours as needed for moderate pain. 200 mL 0   levothyroxine (SYNTHROID) 50 MCG tablet Place 1 tablet (50 mcg total) into feeding tube daily at 6 (six) AM. 30 tablet 0   lidocaine-prilocaine (EMLA) cream Apply to affected area once 30 g 3   metoprolol succinate (TOPROL-XL) 25 MG 24 hr tablet Take 1 tablet (25 mg total) by mouth daily. 30 tablet 0   Nutritional Supplements (FEEDING SUPPLEMENT, KATE FARMS STANDARD 1.4,) LIQD liquid Place 325 mLs into feeding tube 5 (five) times daily. 1000 mL 0   nystatin (MYCOSTATIN) 100000 UNIT/ML suspension Take 5 mLs (500,000 Units total) by mouth in the morning, at noon, and at bedtime. SWISH AND SPIT 473 mL 0   omeprazole (PRILOSEC) 20 MG capsule Take 1 capsule (20 mg total) by mouth daily. 90 capsule 1   ondansetron (ZOFRAN) 8 MG tablet Take 1 tablet (8 mg total) by mouth 2 (two) times daily as needed for refractory nausea / vomiting. Start on day 3 after carboplatin chemo. 30 tablet 1   pantoprazole sodium (PROTONIX) 40  mg PACK Place 20 mLs (40 mg total) into feeding tube daily. 30 packet 0   predniSONE (DELTASONE) 50 MG tablet Take 1 tablet (50 mg total) by mouth daily with breakfast. 7 tablet 0   pregabalin (LYRICA) 150 MG capsule Take 1 capsule (150 mg total) by mouth 2 (two) times daily. 60 capsule 3   prochlorperazine (COMPAZINE) 10 MG tablet Take 1 tablet (10 mg total) by mouth every 6 (six) hours as needed (Nausea or vomiting). 30 tablet 1   senna-docusate (SENOKOT-S) 8.6-50 MG tablet Take 2 tablets by mouth daily. 60 tablet 3   sulfamethoxazole-trimethoprim (BACTRIM DS) 800-160 MG tablet Take 1 tablet (160 mg of trimethoprim total) by G tube once daily for 30 days     traMADol (ULTRAM) 50 MG tablet Place 1 tablet (50 mg total) into feeding tube every 6 (six) hours as needed for moderate pain. 30 tablet 0   Water For Irrigation, Sterile (FREE WATER) SOLN Place 120 mLs into feeding tube 5 (five) times daily. 300 mL 0   Chlorhexidine Gluconate Cloth 2 % PADS Apply 6 each topically daily. (Patient not taking: Reported on 09/14/2021) 1 each 0   diphenoxylate-atropine (LOMOTIL) 2.5-0.025 MG/5ML liquid Take 5 mLs by mouth 4 (four) times daily as needed for diarrhea or loose stools. (Patient not taking: Reported on 09/14/2021) 60 mL 0   enoxaparin (LOVENOX) 40 MG/0.4ML injection Inject 0.4 mLs (40 mg total) into the skin daily. (Patient not taking: Reported on 09/14/2021) 1 mL 0   insulin aspart (NOVOLOG) 100 UNIT/ML injection Inject 0-9 Units into the skin every 4 (four) hours. (Patient not taking: Reported on 09/14/2021) 10 mL 11   labetalol (NORMODYNE) 5 MG/ML injection Inject 2 mLs (10 mg total) into the vein every 3 (three) hours as needed (SBP more than 160 or DBP more than 100). (Patient not taking: Reported on 09/14/2021) 20 mL 0   lenvatinib 20 mg daily dose (LENVIMA, 20 MG DAILY DOSE,) 2 x 10 MG capsule Take 2 capsules (20 mg total) by mouth daily. (Patient not taking: Reported on 09/14/2021) 60 capsule 0    morphine 2 MG/ML injection Inject 1 mL (2 mg total) into the vein every 4 (four) hours as needed. (Patient not taking: Reported on 09/14/2021) 1 mL 0   oxyCODONE (ROXICODONE) 5 MG/5ML solution PLACE 5-10 MLS (5-10 MG TOTAL) INTO FEEDING TUBE EVERY 4 (FOUR)  HOURS AS NEEDED FOR SEVERE PAIN. (Patient not taking: Reported on 09/14/2021)     sodium chloride 0.9 % infusion Inject 1,000 mLs into the vein as needed (for administration of IV medications (carrier fluid)). (Patient not taking: Reported on 09/14/2021) 1000 mL 0   No current facility-administered medications for this visit.   Facility-Administered Medications Ordered in Other Visits  Medication Dose Route Frequency Provider Last Rate Last Admin   sodium chloride flush (NS) 0.9 % injection 10 mL  10 mL Intravenous PRN Earlie Server, MD       sodium chloride flush (NS) 0.9 % injection 10 mL  10 mL Intravenous PRN Earlie Server, MD   10 mL at 10/13/20 1034     PHYSICAL EXAMINATION: ECOG PERFORMANCE STATUS: 1 - Symptomatic but completely ambulatory Vitals:   09/14/21 1354  BP: 104/79  Pulse: (!) 109  Resp: 18  Temp: 98.3 F (36.8 C)   Physical Exam Constitutional:      General: He is not in acute distress.    Appearance: He is not diaphoretic.  HENT:     Head: Normocephalic and atraumatic.     Nose: Nose normal.     Mouth/Throat:     Pharynx: No oropharyngeal exudate.  Eyes:     General: No scleral icterus.    Pupils: Pupils are equal, round, and reactive to light.  Neck:     Comments: Right cervical lymphadenopathy Cardiovascular:     Rate and Rhythm: Normal rate and regular rhythm.     Heart sounds: No murmur heard. Pulmonary:     Effort: Pulmonary effort is normal. No respiratory distress.     Breath sounds: No rales.  Chest:     Chest wall: No tenderness.  Abdominal:     General: There is no distension.     Palpations: Abdomen is soft.     Tenderness: There is no abdominal tenderness.     Comments: +PEG tube   Musculoskeletal:        General: Normal range of motion.     Cervical back: Normal range of motion and neck supple.     Comments:  Decreased right upper extremity strength,3/5, improved.    Lymphadenopathy:     Cervical: Cervical adenopathy present.  Skin:    General: Skin is warm and dry.     Findings: No erythema.  Neurological:     Mental Status: He is alert and oriented to person, place, and time.     Cranial Nerves: No cranial nerve deficit.     Motor: No abnormal muscle tone.     Coordination: Coordination normal.  Psychiatric:        Mood and Affect: Affect normal.     LABORATORY DATA:  I have reviewed the data as listed Lab Results  Component Value Date   WBC 6.9 09/14/2021   HGB 10.8 (L) 09/14/2021   HCT 33.9 (L) 09/14/2021   MCV 96.3 09/14/2021   PLT 283 09/14/2021   Recent Labs    08/01/21 0829 08/08/21 0919 08/08/21 1335 08/12/21 0642 08/13/21 0630 09/14/21 1332  NA 130* 132*   < > 128* 129* 129*  K 4.1 4.8   < > 4.5 3.9 4.7  CL 94* 96*   < > 90* 95* 89*  CO2 28 28   < > '28 29 31  ' GLUCOSE 118* 141*   < > 108* 114* 128*  BUN 23 35*   < > 16 18 25*  CREATININE 1.06 1.87*   < >  0.71 0.75 0.94  CALCIUM 8.8* 9.1   < > 9.0 8.4* 8.2*  GFRNONAA >60 39*   < > >60 >60 >60  PROT 6.3* 7.3  --   --   --  6.2*  ALBUMIN 3.7 4.0  --   --   --  3.1*  AST 21 48*  --   --   --  16  ALT 17 24  --   --   --  27  ALKPHOS 68 76  --   --   --  64  BILITOT 0.6 0.9  --   --   --  0.3   < > = values in this interval not displayed.   RADIOGRAPHIC STUDIES: I have personally reviewed the radiological images as listed and agreed with the findings in the report. CT ANGIO HEAD NECK W WO CM  Result Date: 08/12/2021 CLINICAL DATA:  Follow-up examination for acute stroke, possible dural sinus thrombosis. EXAM: CT ANGIOGRAPHY HEAD AND NECK CT VENOGRAM HEAD TECHNIQUE: Multidetector CT imaging of the head and neck was performed using the standard protocol during bolus administration  of intravenous contrast. Multiplanar CT image reconstructions and MIPs were obtained to evaluate the vascular anatomy. Carotid stenosis measurements (when applicable) are obtained utilizing NASCET criteria, using the distal internal carotid diameter as the denominator. Dedicated CT imaging of the dural venous sinuses using the standard protocol following the administration of IV contrast. CONTRAST:  51m OMNIPAQUE IOHEXOL 350 MG/ML SOLN COMPARISON:  Comparison made with previous MRI from 08/10/2021. FINDINGS: CT HEAD FINDINGS Brain: Cerebral volume within normal limits. Previously identified punctate right frontal infarcts not visible by CT. No other acute large vessel territory infarct. No intracranial hemorrhage. No mass lesion, mass effect or midline shift. No hydrocephalus or extra-axial fluid collection. Vascular: No hyperdense vessel. Skull: Scalp soft tissues and calvarium within normal limits. Sinuses: Scattered mucosal thickening within the ethmoidal air cells and maxillary sinuses. Trace chronic right mastoid effusion. Orbits: Globes and orbital soft tissues within normal limits. Review of the MIP images confirms the above findings CTA NECK FINDINGS Aortic arch: Visualized aortic arch normal in caliber with normal 3 vessel morphology. Minor for age atheromatous change along the undersurface of the aortic arch. No stenosis about the origin of the great vessels. Right carotid system: Right CCA widely patent without stenosis. Ill-defined soft tissue density partially surrounding the proximal and mid right CCA consistent with post treatment changes. Mild for age atheromatous change about the right carotid bulb and proximal right ICA without significant stenosis. Right ICA widely patent distally without stenosis dissection or occlusion. Left carotid system: Left CCA patent from its origin to the bifurcation without stenosis. Mild for age eccentric mixed plaque at the proximal left ICA without significant  stenosis. Left ICA patent distally without stenosis, dissection or occlusion. Vertebral arteries: Both vertebral arteries arise from the subclavian arteries. No proximal subclavian artery stenosis. Similarly, ill-defined soft tissue partially surrounding the proximal right vertebral artery consistent with post treatment changes. Vertebral arteries widely patent without stenosis, dissection or occlusion. Skeleton: No visible acute osseous finding. No discrete or worrisome osseous lesions. Torus mandibularis noted. Other neck: Post treatment changes seen within the mid and lower right neck. Mucosal edema within the right greater than left glottic and supraglottic larynx likely reflects post radiation changes. Layering secretions noted within the hypopharynx. Scattered soft tissue stranding within the right anterolateral neck and retropharyngeal space also consistent with post radiation changes. Patient's known ill-defined nodal conglomerate just to the right  of the esophagus at the thoracic inlet again seen, somewhat difficult to measure, but grossly similar from previous. Possible invasion of the adjacent esophagus which demonstrates irregular ill-defined wall thickening again noted. Changes are relatively similar to prior neck CT from 04/01/2021. Upper chest: Right greater than left layering pleural effusions partially visualized. Associated atelectasis and/or consolidation at the partially visualized right posterior lung. 1.7 cm nodule present at the posterior right upper lobe, measuring slightly larger as compared to recent chest CT from 08/08/2021. Underlying emphysematous changes noted. 1.6 cm precarinal lymph node noted, indeterminate (series 9, image 1). Review of the MIP images confirms the above findings CTA HEAD FINDINGS Anterior circulation: Both internal carotid arteries widely patent to the termini without stenosis. 6 mm focal outpouching arising from the cavernous left ICA consistent with aneurysm  (series 11, image 252). This projects laterally. A1 segments patent bilaterally. Normal anterior communicating artery complex. Anterior cerebral arteries patent to their distal aspects without stenosis. No M1 stenosis or occlusion. Normal MCA bifurcations. Distal MCA branches well perfused and symmetric. Posterior circulation: Both V4 segments patent to the vertebrobasilar junction without stenosis. Both PICA origins patent and normal. Basilar widely patent to its distal aspect without stenosis. Superior cerebellar arteries patent bilaterally. Both PCAs primarily supplied via the basilar and are well perfused to there distal aspects. Venous sinuses: Additional delayed venous phase images were performed. Normal enhancement seen throughout the superior sagittal sinus to the level of the torcula. Left transverse and sigmoid sinus is patent as is the visualized left internal jugular vein. Dominant right transverse and sigmoid sinuses demonstrate heterogeneous early enhancement, but are seen to fill and opacify with additional delayed images, consistent with slow but patent flow. Straight sinus, vein of Galen, internal cerebral veins, and basal veins of Rosenthal are patent. Cavernous sinus appears patent without abnormality. Superior orbital veins grossly symmetric and normal. No appreciable cortical vein thrombosis. Anatomic variants: None significant. Review of the MIP images confirms the above findings IMPRESSION: CT HEAD IMPRESSION: Stable head CT. 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 No other acute intracranial abnormality. CTA HEAD AND NECK IMPRESSION: 1. Negative CTA for large vessel occlusion. Mild atheromatous disease for age, with no hemodynamically significant or correctable stenosis. 2. 6 mm cavernous left ICA aneurysm. 3. Post treatment changes within the lower neck with similar size and appearance of ill-defined right paraesophageal nodal mass. Possible  invasion of the adjacent esophagus again noted. 4. Right greater than left layering pleural effusions with associated atelectasis and/or consolidation. 5. 1.7 cm right upper lobe nodule, consistent with metastatic disease. This measures slightly larger in size as compared to recent chest CT from 08/08/2021. 6. 1.6 cm precarinal lymph node, indeterminate. Attention at follow-up recommended. CT VENOGRAM IMPRESSION: 1. No evidence for dural venous sinus thrombosis. 2. Evidence for patent but slow flow within the dominant right transverse and sigmoid sinuses, corresponding with the asymmetric FLAIR signal intensity seen on prior MRI. Electronically Signed   By: Jeannine Boga M.D.   On: 08/12/2021 02:12   DG Wrist 2 Views Right  Result Date: 08/10/2021 CLINICAL DATA:  Hand and wrist soreness and swelling EXAM: RIGHT WRIST - 2 VIEW; RIGHT HAND - 2 VIEW COMPARISON:  None. FINDINGS: Wrist: There is no acute fracture or dislocation. There is minimal positive ulnar variance. Alignment is otherwise normal. The joint spaces are preserved. The soft tissues are unremarkable. Hand: There is no acute fracture or dislocation. Alignment is normal. There is degenerative change at the  thumb MCP joint. The joint spaces are otherwise preserved. The soft tissues are unremarkable. IMPRESSION: Mild degenerative change at the thumb MCP joint. Otherwise, unremarkable hand and wrist radiographs. Electronically Signed   By: Valetta Mole M.D.   On: 08/10/2021 13:33   CT HEAD WO CONTRAST (5MM)  Result Date: 08/08/2021 CLINICAL DATA:  Found on floor by family. Personal history of esophageal cancer. Feeding tube. Mental status change. Hypoxia. EXAM: CT HEAD WITHOUT CONTRAST CT CERVICAL SPINE WITHOUT CONTRAST TECHNIQUE: Multidetector CT imaging of the head and cervical spine was performed following the standard protocol without intravenous contrast. Multiplanar CT image reconstructions of the cervical spine were also generated.  COMPARISON:  MRI had without and with contrast 04/10/2021 FINDINGS: CT HEAD FINDINGS Brain: No acute infarct, hemorrhage, or mass lesion is present. No significant white matter lesions are present. The ventricles are of normal size. No significant extraaxial fluid collection is present. The brainstem and cerebellum are within normal limits. Vascular: No hyperdense vessel or unexpected calcification. Skull: Right supraorbital soft tissue swelling and hematoma is present. No underlying fracture is present. Calvarium otherwise within normal limits. No other significant extracranial soft tissue injury is present. Sinuses/Orbits: Mild mucosal thickening is present in the maxillary sinuses bilaterally. No air-fluid levels are present. The globes and orbits are within normal limits. CT CERVICAL SPINE FINDINGS Alignment: No significant listhesis is present. Cervical lordosis preserved. Skull base and vertebrae: Craniocervical junction is within normal limits. Vertebral body heights are normal. Narrow acute or healing fractures are present. Soft tissues and spinal canal: No prevertebral fluid or swelling. No visible canal hematoma. Esophageal mass the thoracic inlet again noted. Edema along the right vocal cord improved. Right IJ Port-A-Cath in place. Disc levels: Uncovertebral spurring leads to moderate foraminal narrowing at C3-4 and C5-6, left greater than right. Upper chest: Lung apices are clear. IMPRESSION: 1. Right supraorbital soft tissue swelling and hematoma without underlying fracture. 2. Normal CT appearance of the brain. 3. No acute fracture or traumatic subluxation in the cervical spine. 4. Multilevel degenerative changes of the cervical spine as described. Electronically Signed   By: San Morelle M.D.   On: 08/08/2021 13:54   CT Angio Chest PE W and/or Wo Contrast  Result Date: 08/08/2021 CLINICAL DATA:  History metastatic or pharyngeal squamous carcinoma. Found on floor at home and hypoxic.  Elevated white blood cell count, D-dimer and lipase. EXAM: CT ANGIOGRAPHY CHEST WITH CONTRAST TECHNIQUE: Multidetector CT imaging of the chest was performed using the standard protocol during bolus administration of intravenous contrast. Multiplanar CT image reconstructions and MIPs were obtained to evaluate the vascular anatomy. CONTRAST:  83m OMNIPAQUE IOHEXOL 350 MG/ML SOLN COMPARISON:  Prior CT of the chest on 04/01/2021 FINDINGS: Cardiovascular: The pulmonary arteries are adequately opacified. There is no evidence of pulmonary embolism. Central pulmonary arteries are normal in caliber. The thoracic aorta is normal in caliber. Stable heart size. Stable calcified coronary artery plaque. 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 No evidence of mediastinal or hilar lymphadenopathy. Increased prominence of some adjacent superior left axillary lymph nodes with the largest measuring approximately 1 cm in short axis. Lungs/Pleura: New extensive airspace consolidation and volume loss of the right lower lobe and right middle lobe with visible occlusive debris in the bronchus intermedius and scattered small air bronchograms in consolidated lung. Visible nearly occlusive debris in the left mainstem bronchus and debris in the  left lower lobe bronchus with near complete atelectasis of the left lower lobe. Focal airspace disease in the medial aspect of the right middle lobe and posterior left upper lobe. Pulmonary findings are consistent with bilateral pneumonia and highly suggestive of aspiration pneumonia given the extensive debris in the airways. No significant associated pleural fluid or evidence of pneumothorax. Known metastatic pulmonary nodule in the superior right upper lobe shows interval enlargement since the prior study in April. This previously measured 8 mm and now measures 13 x 13 mm.  Metastatic mass in the inferior aspect of the right upper lobe previously measuring 1.2 x 1.3 cm shows enlargement and now measures approximately 2.8 x 2.9 cm. Previously noted right lower lobe pulmonary nodule is not visible on today study due to the extensive consolidation and volume loss of the right lower lobe. Musculoskeletal: No chest wall abnormality. No acute or significant osseous findings. Review of the MIP images confirms the above findings. IMPRESSION: 1. No evidence of pulmonary embolism. 2. New extensive airspace consolidation and volume loss of the right lower lobe, right middle lobe and left lower lobe with significant debris in the right bronchus intermedius, left mainstem bronchus and left lower lobe bronchus. Additional airspace disease in the right middle lobe and left upper lobe. Findings are suggestive of bilateral aspiration pneumonia. 3. Progression of metastatic disease in the right lung with enlargement of superior right upper lobe and inferior right upper lobe nodules as noted above. The previously noted right lower lobe nodule cannot be assessed as it is obscured by consolidation of the right lower lobe. 4. 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 5. Increased prominence of left axillary lymph nodes which only measure 1 cm in short axis. Electronically Signed   By: Aletta Edouard M.D.   On: 08/08/2021 14:11   CT Cervical Spine Wo Contrast  Result Date: 08/08/2021 CLINICAL DATA:  Found on floor by family. Personal history of esophageal cancer. Feeding tube. Mental status change. Hypoxia. EXAM: CT HEAD WITHOUT CONTRAST CT CERVICAL SPINE WITHOUT CONTRAST TECHNIQUE: Multidetector CT imaging of the head and cervical spine was performed following the standard protocol without intravenous contrast. Multiplanar CT image reconstructions of the cervical spine were also generated. COMPARISON:  MRI had without and with contrast 04/10/2021 FINDINGS: CT HEAD  FINDINGS Brain: No acute infarct, hemorrhage, or mass lesion is present. No significant white matter lesions are present. The ventricles are of normal size. No significant extraaxial fluid collection is present. The brainstem and cerebellum are within normal limits. Vascular: No hyperdense vessel or unexpected calcification. Skull: Right supraorbital soft tissue swelling and hematoma is present. No underlying fracture is present. Calvarium otherwise within normal limits. No other significant extracranial soft tissue injury is present. Sinuses/Orbits: Mild mucosal thickening is present in the maxillary sinuses bilaterally. No air-fluid levels are present. The globes and orbits are within normal limits. CT CERVICAL SPINE FINDINGS Alignment: No significant listhesis is present. Cervical lordosis preserved. Skull base and vertebrae: Craniocervical junction is within normal limits. Vertebral body heights are normal. Narrow acute or healing fractures are present. Soft tissues and spinal canal: No prevertebral fluid or swelling. No visible canal hematoma. Esophageal mass the thoracic inlet again noted. Edema along the right vocal cord improved. Right IJ Port-A-Cath in place. Disc levels: Uncovertebral spurring leads to moderate foraminal narrowing at C3-4 and C5-6, left greater than right. Upper chest: Lung apices are clear. IMPRESSION: 1. Right supraorbital soft tissue swelling and hematoma without underlying fracture.  2. Normal CT appearance of the brain. 3. No acute fracture or traumatic subluxation in the cervical spine. 4. Multilevel degenerative changes of the cervical spine as described. Electronically Signed   By: San Morelle M.D.   On: 08/08/2021 13:54   MR BRAIN WO CONTRAST  Result Date: 08/11/2021 CLINICAL DATA:  Initial evaluation for neuro deficit, stroke suspected, cervical radiculopathy. EXAM: MRI HEAD WITHOUT CONTRAST TECHNIQUE: Multiplanar, multiecho pulse sequences of the brain and surrounding  structures were obtained without intravenous contrast. COMPARISON:  Prior study from 08/08/2021. FINDINGS: MRI HEAD FINDINGS Brain: Cerebral volume within normal limits. No significant cerebral white matter disease for age. There are 2 subtle adjacent punctate foci of diffusion abnormality involving the subcortical aspect of the posterior right frontal region (series 5, images 36, 35), suspicious for tiny acute to early subacute ischemic infarcts. No associated hemorrhage or mass effect. No other diffusion abnormality to suggest acute or subacute ischemia. Gray-white matter differentiation otherwise maintained. No encephalomalacia to suggest chronic cortical infarction elsewhere within the brain. No other evidence for acute or chronic intracranial hemorrhage. No mass lesion, midline shift or mass effect. No hydrocephalus or extra-axial fluid collection. Pituitary gland and suprasellar region within normal limits. Midline structures intact. Vascular: There is asymmetric FLAIR signal with intermediate T1 signal intensity involving the right transverse and sigmoid sinus (series 15, image 14). While this finding is favored to reflect slow/sluggish flow, possible thrombus is difficult to exclude. Major intracranial vascular flow voids are otherwise maintained. Skull and upper cervical spine: Craniocervical junction within normal limits. Bone marrow signal intensity normal. No focal marrow replacing lesion. No scalp soft tissue abnormality. Sinuses/Orbits: Globes and orbital soft tissues within normal limits. Mild scattered mucosal thickening noted within the ethmoidal air cells and maxillary sinuses. Paranasal sinuses are otherwise clear. Small right mastoid effusion noted, of doubtful significance. Inner ear structures grossly normal. Other: None. IMPRESSION: 1. Two subtle adjacent punctate foci of diffusion abnormality involving the subcortical aspect of the posterior right frontal region, suspicious for tiny acute to  early subacute ischemic infarcts. No associated hemorrhage or mass effect. 2. Asymmetric FLAIR signal intensity involving the right transverse and sigmoid sinus. While this finding is favored to reflect slow/sluggish flow, possible thrombus is difficult to exclude. Further assessment with dedicated MRV, with and without contrast, suggested for further evaluation. 3. Otherwise normal brain MRI for age. Electronically Signed   By: Jeannine Boga M.D.   On: 08/11/2021 00:53   MR CHEST W WO CONTRAST  Result Date: 08/12/2021 CLINICAL DATA:  Progressive right upper extremity weakness. 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 EXAM: MR CHEST WITH AND WITHOUT CONTRAST TECHNIQUE: Multiplanar, multisequence MR imaging of the right brachial plexus was performed before and after the administration of intravenous contrast. CONTRAST:  7.20m GADAVIST GADOBUTROL 1 MMOL/ML IV SOLN COMPARISON:  CTA head and neck from yesterday. FINDINGS: Spinal cord Normal caliber and signal of visualized spinal cord. Brachial plexus There is diffusely increased T2 signal and mild enhancement involving the right brachial plexus roots, trunks, divisions, cords, and branches. Muscles and tendons Incompletely evaluated mild edema involving the right rotator cuff muscles. Bones Please see separate MRI cervical spine reports from today and yesterday. Other findings Unchanged moderate right pleural effusion. Similar right paraesophageal nodal conglomerate at the thoracic inlet. Edematous glottic and supraglottic larynx consistent with post radiation change. Similar layering secretions in the hypopharynx. Non-enhancing soft tissue thickening surrounding the right common carotid artery, also consistent with post treatment change. IMPRESSION: 1. 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 2. Similar right paraesophageal nodal conglomerate at the thoracic inlet. Similar post treatment changes in the larynx  and surrounding the right common carotid artery. 3. Unchanged moderate right pleural effusion. Electronically Signed   By: Titus Dubin M.D.   On: 08/12/2021 20:15   MR CERVICAL SPINE WO CONTRAST  Result Date: 08/11/2021 CLINICAL DATA:  Initial evaluation for cervical radiculopathy. History of oropharyngeal squamous cell carcinoma. EXAM: MRI CERVICAL SPINE WITHOUT CONTRAST TECHNIQUE: Multiplanar, multisequence MR imaging of the cervical spine was performed. No intravenous contrast was administered. COMPARISON:  Prior CT of the neck from 01/05/2021. FINDINGS: Alignment: Straightening of the normal cervical lordosis. No listhesis. Vertebrae: Vertebral body height maintained without acute or chronic fracture. Suspected postradiation changes with possible fatty marrow conversion within the visualized osseous structures. Mild marrow edema involving the anterior aspects of the C4 through C7 vertebral bodies could be post treatment related as well. No discrete or worrisome osseous lesions. Facet arthrosis with associated marrow edema present about the left C3-4 facet, which could contribute to neck pain. Cord: Normal signal and morphology. Posterior Fossa, vertebral arteries, paraspinal tissues: Craniocervical junction within normal limits. Irregular appearance of the visualized hypopharynx with layering retropharyngeal effusion and right paraesophageal nodal conglomerate, grossly similar to prior neck CT. Normal flow voids seen within the vertebral arteries bilaterally. Disc levels: C2-C3: Mild left eccentric disc bulge with left-sided uncinate spurring. Mild left-sided facet hypertrophy. No spinal stenosis. Foramina remain patent. C3-C4: Broad-based posterior disc osteophyte complex flattens and partially effaces the ventral thecal sac, asymmetric to the left. Mild left-sided spinal stenosis without cord impingement. Superimposed moderate left-sided facet degeneration. Moderate left with mild right C4 foraminal  narrowing. C4-C5: Mild disc bulge with uncovertebral spurring. Mild left greater than right facet hypertrophy. No spinal stenosis. Mild bilateral C5 foraminal narrowing. C5-C6: Broad-based disc osteophyte complex flattens and partially effaces the ventral thecal sac, asymmetric to the right. Mild spinal stenosis with mild flattening of the ventral cord, but no cord signal changes. Severe right worse than left C6 foraminal narrowing. C6-C7: Minimal disc bulge with bilateral uncovertebral spurring. No spinal stenosis. Superimposed mild left-sided facet hypertrophy. Mild bilateral C7 foraminal narrowing. C7-T1: Negative interspace. Mild left-sided facet hypertrophy. No stenosis. Visualized upper thoracic spine demonstrates no significant finding. IMPRESSION: 1. Degenerative disc osteophyte at C5-6 with resultant mild spinal stenosis, with severe bilateral C6 foraminal narrowing. 2. Left eccentric disc osteophyte and facet arthrosis at C3-4 with resultant mild left-sided spinal stenosis, with moderate left C4 foraminal narrowing. 3. More mild multilevel spondylosis elsewhere within the cervical spine as above. No other significant stenosis or neural impingement. 4. Post treatment changes within the visualized neck with associated right paraesophageal nodal conglomerate, partially visualized, but grossly similar to previous neck CT from 01/05/2021. Electronically Signed   By: Jeannine Boga M.D.   On: 08/11/2021 01:10   MR CERVICAL SPINE W CONTRAST  Result Date: 08/12/2021 CLINICAL DATA:  Head and neck cancer, known C-spine involvement EXAM: MRI CERVICAL SPINE WITH CONTRAST TECHNIQUE: Multiplanar, multisequence MR imaging of the cervical spine was performed following the administration of intravenous contrast. COMPARISON:  08/10/2021 MRI cervical spine without contrast, 05/31/2021 MRI cervical spine with and without contrast. Correlation is also made with CTA head neck 08/11/2021 FINDINGS: Alignment:  Physiologic. Vertebrae: Diffuse fatty replacement of the bone marrow secondary to prior radiation in the neck. Redemonstrated small areas of enhancement at the anterior aspect of C5, C6, C7, and T1, which appear overall similar to the  prior exam. No acute fracture. Cord: No abnormal enhancement. Posterior Fossa, vertebral arteries, paraspinal tissues: Mucosal thickening and enhancement in the esophagus, with a nonenhancing fluid collection at the right aspect. Posterior fossa is unremarkable. Disc levels: Please see previous MRI of the cervical spine 08/10/2021. IMPRESSION: Redemonstrated small areas of enhancement at the anterior aspects of C5, C6, C7, and T1, which appear overall similar to the prior exam. No new foci of abnormal osseous enhancement. No evidence of cord enhancement. Electronically Signed   By: Merilyn Baba M.D.   On: 08/12/2021 17:19   CT ABDOMEN PELVIS W CONTRAST  Result Date: 08/08/2021 CLINICAL DATA:  History of metastatic oropharyngeal squamous carcinoma. Found on floor at home and hypoxic. Elevated white blood cell count, D-dimer and lipase. EXAM: CT ABDOMEN AND PELVIS WITH CONTRAST TECHNIQUE: Multidetector CT imaging of the abdomen and pelvis was performed using the standard protocol following bolus administration of intravenous contrast. CONTRAST:  39m OMNIPAQUE IOHEXOL 350 MG/ML SOLN COMPARISON:  CT of the abdomen without contrast on 09/15/2020 and PET scan on 11/09/2020 FINDINGS: Hepatobiliary: Stable left lobe hepatic cyst. Gallbladder and bile ducts are unremarkable. Pancreas: Unremarkable. No pancreatic ductal dilatation or surrounding inflammatory changes. Spleen: Normal in size without focal abnormality. Adrenals/Urinary Tract: Stable right renal cyst. No hydronephrosis or calculi. No solid renal masses or adrenal masses. The bladder is unremarkable. Stomach/Bowel: Gastrostomy tube present with normal positioning within the lumen of the stomach. No evidence of bowel obstruction,  ileus or inflammation. No mass lesions are seen involving the bowel. Diverticulosis of the descending and sigmoid colon without evidence of diverticulitis. No free intraperitoneal air. Vascular/Lymphatic: No significant vascular findings are present. No enlarged abdominal or pelvic lymph nodes. Reproductive: Prostate is unremarkable. Other: No abdominal wall hernia or abnormality. No abdominopelvic ascites. Musculoskeletal: No acute or significant osseous findings. IMPRESSION: No acute findings in the abdomen or pelvis. Gastrostomy tube is present and in appropriate position. Electronically Signed   By: GAletta EdouardM.D.   On: 08/08/2021 14:22   DG Hand 2 View Right  Result Date: 08/10/2021 CLINICAL DATA:  Hand and wrist soreness and swelling EXAM: RIGHT WRIST - 2 VIEW; RIGHT HAND - 2 VIEW COMPARISON:  None. FINDINGS: Wrist: There is no acute fracture or dislocation. There is minimal positive ulnar variance. Alignment is otherwise normal. The joint spaces are preserved. The soft tissues are unremarkable. Hand: There is no acute fracture or dislocation. Alignment is normal. There is degenerative change at the thumb MCP joint. The joint spaces are otherwise preserved. The soft tissues are unremarkable. IMPRESSION: Mild degenerative change at the thumb MCP joint. Otherwise, unremarkable hand and wrist radiographs. Electronically Signed   By: PValetta MoleM.D.   On: 08/10/2021 13:33   DG Chest Portable 1 View  Result Date: 08/08/2021 CLINICAL DATA:  Low O2 saturation EXAM: PORTABLE CHEST 1 VIEW COMPARISON:  None FINDINGS: There is a chest port with catheter tip overlying the right atrium. Unchanged, enlarged cardiac silhouette. There are low lung volumes with bibasilar atelectasis and possible small effusions. Nodular opacities in the right lung consistent with known pulmonary nodules. No new focal airspace disease. No visible pneumothorax. Bilateral shoulder degenerative changes. No acute osseous abnormality.  Thoracic spondylosis. IMPRESSION: Low lung volumes with bibasilar subsegmental atelectasis and probable small bilateral pleural effusions. Electronically Signed   By: JMaurine SimmeringM.D.   On: 08/08/2021 09:54   ECHOCARDIOGRAM COMPLETE  Result Date: 08/12/2021    ECHOCARDIOGRAM REPORT   Patient Name:   TMaximilliano Garcia  Ivan Marion. Date of Exam: 08/12/2021 Medical Rec #:  [id number]              Height:       70.0 in Accession #:    [id number]             Weight:       189.2 lb Date of Birth:  11-25-1956              BSA:          2.038 m Patient Age:    63 years               BP:           148/100 mmHg Patient Gender: M                      HR:           102 bpm. Exam Location:  [facility] Procedure: 2D Echo, Color Doppler and Cardiac Doppler Indications:     I63.9 Stroke  History:         Patient has no prior history of Echocardiogram examinations.                  Risk Factors:Hypertension and Dyslipidemia.  Sonographer:     Charmayne Sheer Referring Phys:  [id number] AMRIT ADHIKARI Diagnosing Phys: Yolonda Kida MD  Sonographer Comments: Technically challenging study due to limited acoustic windows. Image acquisition challenging due to patient body habitus and Image acquisition challenging due to respiratory motion. IMPRESSIONS  1. Left ventricular ejection fraction, by estimation, is 40 to 45%. The left ventricle has mildly decreased function. The left ventricle demonstrates global hypokinesis. Left ventricular diastolic parameters are consistent with Grade II diastolic dysfunction (pseudonormalization).  2. Right ventricular systolic function is normal. The right ventricular size is moderately enlarged.  3. The mitral valve is normal in structure. Trivial mitral valve regurgitation.  4. The aortic valve is grossly normal. Aortic valve regurgitation is not visualized. FINDINGS  Left Ventricle: Left ventricular ejection fraction, by estimation, is 40 to 45%. The left ventricle has mildly decreased function. The left ventricle  demonstrates global hypokinesis. The left ventricular internal cavity size was normal in size. There is  no left ventricular hypertrophy. Left ventricular diastolic parameters are consistent with Grade II diastolic dysfunction (pseudonormalization). Right Ventricle: The right ventricular size is moderately enlarged. No increase in right ventricular wall thickness. Right ventricular systolic function is normal. Left Atrium: Left atrial size was normal in size. Right Atrium: Right atrial size was normal in size. Pericardium: There is no evidence of pericardial effusion. Mitral Valve: The mitral valve is normal in structure. Trivial mitral valve regurgitation. MV peak gradient, 3.9 mmHg. The mean mitral valve gradient is 2.0 mmHg. Tricuspid Valve: The tricuspid valve is grossly normal. Tricuspid valve regurgitation is mild. Aortic Valve: The aortic valve is grossly normal. Aortic valve regurgitation is not visualized. Aortic valve mean gradient measures 2.0 mmHg. Aortic valve peak gradient measures 3.7 mmHg. Aortic valve area, by VTI measures 3.38 cm. Pulmonic Valve: The pulmonic valve was grossly normal. Pulmonic valve regurgitation is not visualized. Aorta: The ascending aorta was not well visualized. IAS/Shunts: No atrial level shunt detected by color flow Doppler.  LEFT VENTRICLE PLAX 2D LVIDd:         4.30 cm  Diastology LVIDs:         3.50 cm  LV e' medial:    5.22 cm/s LV PW:  1.00 cm  LV E/e' medial:  9.8 LV IVS:        0.70 cm  LV e' lateral:   7.72 cm/s LVOT diam:     2.20 cm  LV E/e' lateral: 6.7 LV SV:         52 LV SV Index:   25 LVOT Area:     3.80 cm  LEFT ATRIUM           Index LA diam:      3.10 cm 1.52 cm/m LA Vol (A4C): 25.8 ml 12.66 ml/m  AORTIC VALVE                   PULMONIC VALVE AV Area (Vmax):    3.35 cm    PV Vmax:       0.82 m/s AV Area (Vmean):   3.45 cm    PV Vmean:      56.400 cm/s AV Area (VTI):     3.38 cm    PV VTI:        0.101 m AV Vmax:           96.30 cm/s  PV Peak  grad:  2.7 mmHg AV Vmean:          65.700 cm/s PV Mean grad:  1.0 mmHg AV VTI:            0.153 m AV Peak Grad:      3.7 mmHg AV Mean Grad:      2.0 mmHg LVOT Vmax:         84.90 cm/s LVOT Vmean:        59.700 cm/s LVOT VTI:          0.136 m LVOT/AV VTI ratio: 0.89  AORTA Ao Root diam: 3.70 cm MITRAL VALVE MV Area (PHT): 9.37 cm    SHUNTS MV Area VTI:   3.83 cm    Systemic VTI:  0.14 m MV Peak grad:  3.9 mmHg    Systemic Diam: 2.20 cm MV Mean grad:  2.0 mmHg MV Vmax:       0.99 m/s MV Vmean:      58.9 cm/s MV Decel Time: 81 msec MV E velocity: 51.40 cm/s MV A velocity: 75.80 cm/s MV E/A ratio:  0.68 Dwayne D Callwood MD Electronically signed by Yolonda Kida MD Signature Date/Time: 08/12/2021/4:11:53 PM    Final    CT VENOGRAM HEAD  Result Date: 08/12/2021 CLINICAL DATA:  Follow-up examination for acute stroke, possible dural sinus thrombosis. EXAM: CT ANGIOGRAPHY HEAD AND NECK CT VENOGRAM HEAD TECHNIQUE: Multidetector CT imaging of the head and neck was performed using the standard protocol during bolus administration of intravenous contrast. Multiplanar CT image reconstructions and MIPs were obtained to evaluate the vascular anatomy. Carotid stenosis measurements (when applicable) are obtained utilizing NASCET criteria, using the distal internal carotid diameter as the denominator. Dedicated CT imaging of the dural venous sinuses using the standard protocol following the administration of IV contrast. CONTRAST:  69m OMNIPAQUE IOHEXOL 350 MG/ML SOLN COMPARISON:  Comparison made with previous MRI from 08/10/2021. FINDINGS: CT HEAD FINDINGS Brain: Cerebral volume within normal limits. Previously identified punctate right frontal infarcts not visible by CT. No other acute large vessel territory infarct. No intracranial hemorrhage. No mass lesion, mass effect or midline shift. No hydrocephalus or extra-axial fluid collection. Vascular: No hyperdense vessel. Skull: Scalp soft tissues and calvarium within normal  limits. Sinuses: Scattered mucosal thickening within the ethmoidal air cells and maxillary sinuses. Trace chronic  right mastoid effusion. Orbits: Globes and orbital soft tissues within normal limits. Review of the MIP images confirms the above findings CTA NECK FINDINGS Aortic arch: Visualized aortic arch normal in caliber with normal 3 vessel morphology. Minor for age atheromatous change along the undersurface of the aortic arch. No stenosis about the origin of the great vessels. Right carotid system: Right CCA widely patent without stenosis. Ill-defined soft tissue density partially surrounding the proximal and mid right CCA consistent with post treatment changes. Mild for age atheromatous change about the right carotid bulb and proximal right ICA without significant stenosis. Right ICA widely patent distally without stenosis dissection or occlusion. Left carotid system: Left CCA patent from its origin to the bifurcation without stenosis. Mild for age eccentric mixed plaque at the proximal left ICA without significant stenosis. Left ICA patent distally without stenosis, dissection or occlusion. Vertebral arteries: Both vertebral arteries arise from the subclavian arteries. No proximal subclavian artery stenosis. Similarly, ill-defined soft tissue partially surrounding the proximal right vertebral artery consistent with post treatment changes. Vertebral arteries widely patent without stenosis, dissection or occlusion. Skeleton: No visible acute osseous finding. No discrete or worrisome osseous lesions. Torus mandibularis noted. Other neck: Post treatment changes seen within the mid and lower right neck. Mucosal edema within the right greater than left glottic and supraglottic larynx likely reflects post radiation changes. Layering secretions noted within the hypopharynx. Scattered soft tissue stranding within the right anterolateral neck and retropharyngeal space also consistent with post radiation changes.  Patient's known ill-defined nodal conglomerate just to the right of the esophagus at the thoracic inlet again seen, somewhat difficult to measure, but grossly similar from previous. Possible invasion of the adjacent esophagus which demonstrates irregular ill-defined wall thickening again noted. Changes are relatively similar to prior neck CT from 04/01/2021. Upper chest: Right greater than left layering pleural effusions partially visualized. Associated atelectasis and/or consolidation at the partially visualized right posterior lung. 1.7 cm nodule present at the posterior right upper lobe, measuring slightly larger as compared to recent chest CT from 08/08/2021. Underlying emphysematous changes noted. 1.6 cm precarinal lymph node noted, indeterminate (series 9, image 1). Review of the MIP images confirms the above findings CTA HEAD FINDINGS Anterior circulation: Both internal carotid arteries widely patent to the termini without stenosis. 6 mm focal outpouching arising from the cavernous left ICA consistent with aneurysm (series 11, image 252). This projects laterally. A1 segments patent bilaterally. Normal anterior communicating artery complex. Anterior cerebral arteries patent to their distal aspects without stenosis. No M1 stenosis or occlusion. Normal MCA bifurcations. Distal MCA branches well perfused and symmetric. Posterior circulation: Both V4 segments patent to the vertebrobasilar junction without stenosis. Both PICA origins patent and normal. Basilar widely patent to its distal aspect without stenosis. Superior cerebellar arteries patent bilaterally. Both PCAs primarily supplied via the basilar and are well perfused to there distal aspects. Venous sinuses: Additional delayed venous phase images were performed. Normal enhancement seen throughout the superior sagittal sinus to the level of the torcula. Left transverse and sigmoid sinus is patent as is the visualized left internal jugular vein. Dominant  right transverse and sigmoid sinuses demonstrate heterogeneous early enhancement, but are seen to fill and opacify with additional delayed images, consistent with slow but patent flow. Straight sinus, vein of Galen, internal cerebral veins, and basal veins of Rosenthal are patent. Cavernous sinus appears patent without abnormality. Superior orbital veins grossly symmetric and normal. No appreciable cortical vein thrombosis. Anatomic variants: None significant. Review of the MIP  images confirms the above findings IMPRESSION: CT HEAD IMPRESSION: Stable head CT. 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 No other acute intracranial abnormality. CTA HEAD AND NECK IMPRESSION: 1. Negative CTA for large vessel occlusion. Mild atheromatous disease for age, with no hemodynamically significant or correctable stenosis. 2. 6 mm cavernous left ICA aneurysm. 3. Post treatment changes within the lower neck with similar size and appearance of ill-defined right paraesophageal nodal mass. Possible invasion of the adjacent esophagus again noted. 4. Right greater than left layering pleural effusions with associated atelectasis and/or consolidation. 5. 1.7 cm right upper lobe nodule, consistent with metastatic disease. This measures slightly larger in size as compared to recent chest CT from 08/08/2021. 6. 1.6 cm precarinal lymph node, indeterminate. Attention at follow-up recommended. CT VENOGRAM IMPRESSION: 1. No evidence for dural venous sinus thrombosis. 2. Evidence for patent but slow flow within the dominant right transverse and sigmoid sinuses, corresponding with the asymmetric FLAIR signal intensity seen on prior MRI. Electronically Signed   By: Jeannine Boga M.D.   On: 08/12/2021 02:12      ASSESSMENT & PLAN:   1. 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[place])   2. Dysphagia, unspecified type   3. Port-A-Cath in place   4. Right brachial plexitis   5. 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 S/p 1 dose of Keytruda and less than a week of Lenvatinib.  Treatment was interrupted due to admission secondary to aspiration pneumonia, stroke, right brachial plexitis Not able to further distinguish whether this is a post radiation plexitis versus chemotherapy/checkpoint inhibitor induced side effects.  There was no radiographic evidence of direct tumor infiltration.  His right upper extremity strength appears to be slightly improved with the steroid treatments. Patient has progressed through multiple lines of previous treatments.  It is challenging case if he should be rechallenged by Keytruda/lenvatinib.  He is reluctant due to the concern of further worsening he is plexitis. Continue finish his tapering course of steroids.  I recommend to repeat a restaging image.  I will obtain PET scan.  I would like to further discuss his plan with his [facility] oncology Dr. Mariel Kansky.  Other options include desensitization protocol of cetuximab in a controlled and intensely monitoring environment.  Methotrexate, capecitabine single agent etc. Follow-up with neurology Dr. Mickeal Skinner. Follow-up with palliative care service for further discussion of goals of care  # Dysphagia + PEG, continue tube feeding.  continue ollow-up with nutritionist.  I discussed with Cos Cob. he will see patient and further discuss his options.   Follow-up: To be determined Earlie Server, MD, PhD Hematology Oncology [place] [facility] 09/14/2021

## 2021-09-15 LAB — T4: T4, Total: 3.4 ug/dL — ABNORMAL LOW (ref 4.5–12.0)

## 2021-09-16 ENCOUNTER — Encounter: Payer: Self-pay | Admitting: Internal Medicine

## 2021-09-16 ENCOUNTER — Other Ambulatory Visit: Payer: Self-pay | Admitting: Oncology

## 2021-09-16 ENCOUNTER — Telehealth: Payer: Self-pay

## 2021-09-16 ENCOUNTER — Inpatient Hospital Stay (HOSPITAL_BASED_OUTPATIENT_CLINIC_OR_DEPARTMENT_OTHER): Payer: Medicare Other | Admitting: Internal Medicine

## 2021-09-16 ENCOUNTER — Ambulatory Visit
Admission: RE | Admit: 2021-09-16 | Discharge: 2021-09-16 | Disposition: A | Discharge status: Home / Self Care | Payer: Medicare Other | Source: Ambulatory Visit | Attending: Oncology | Admitting: Oncology

## 2021-09-16 ENCOUNTER — Encounter: Payer: Self-pay | Admitting: Oncology

## 2021-09-16 ENCOUNTER — Other Ambulatory Visit: Payer: Self-pay

## 2021-09-16 VITALS — BP 114/77 | HR 98 | Temp 97.0°F

## 2021-09-16 DIAGNOSIS — R29898 Other symptoms and signs involving the musculoskeletal system: Secondary | ICD-10-CM | POA: Diagnosis not present

## 2021-09-16 DIAGNOSIS — C109 Malignant neoplasm of oropharynx, unspecified: Secondary | ICD-10-CM | POA: Diagnosis not present

## 2021-09-16 DIAGNOSIS — Z79899 Other long term (current) drug therapy: Secondary | ICD-10-CM | POA: Insufficient documentation

## 2021-09-16 DIAGNOSIS — G540 Brachial plexus disorders: Secondary | ICD-10-CM | POA: Diagnosis not present

## 2021-09-16 DIAGNOSIS — E039 Hypothyroidism, unspecified: Secondary | ICD-10-CM

## 2021-09-16 DIAGNOSIS — Z881 Allergy status to other antibiotic agents status: Secondary | ICD-10-CM | POA: Insufficient documentation

## 2021-09-16 DIAGNOSIS — Z7982 Long term (current) use of aspirin: Secondary | ICD-10-CM | POA: Insufficient documentation

## 2021-09-16 DIAGNOSIS — Z431 Encounter for attention to gastrostomy: Secondary | ICD-10-CM | POA: Insufficient documentation

## 2021-09-16 HISTORY — PX: IR REPLACE G-TUBE SIMPLE WO FLUORO: IMG2323

## 2021-09-16 MED ORDER — PREDNISONE 20 MG PO TABS: 20.0000 mg | ORAL_TABLET | Freq: Every day | ORAL | 3 refills | Status: AC

## 2021-09-16 MED ORDER — LIDOCAINE HCL 1 % IJ SOLN
INTRAMUSCULAR | Status: AC
  Filled 2021-09-16: qty 20

## 2021-09-16 MED ORDER — LIDOCAINE VISCOUS HCL 2 % MT SOLN
OROMUCOSAL | Status: AC
  Filled 2021-09-16: qty 15

## 2021-09-16 NOTE — H&P (Signed)
Vascular and Interventional Radiology  PRE PROCEDURE H&P  Assessment  Plan:   Ivan Garcia. is a 65 y.o. year old male who will undergo GASTROSTOMY TUBE EXCHANGE in Interventional Radiology.  The procedure has been fully reviewed with the patient/patient's authorized representative. The risks, benefits and alternatives have been explained, and the patient/patient's authorized representative has consented to the procedure.  HPI: Ivan Garcia. is a 65 y.o. year old male w PMHx significant for SCCa of the oropharynx. Pt known to service s/p G-tube placement (80F, pull through) on 09/24/20. He noted G-tube discoloration and degradation and requested exchange.  Informed consent was obtained, witnessed and placed in the patient's chart.  Allergies:  Allergies  Allergen Reactions   Cetuximab Anaphylaxis    Medications:  Current Outpatient Medications on File Prior to Encounter  Medication Sig Dispense Refill   acetaminophen (TYLENOL) 160 MG/5ML solution Take by mouth.     ALPRAZolam (XANAX) 0.25 MG tablet Place 1 tablet (0.25 mg total) into feeding tube at bedtime as needed for anxiety. 30 tablet 0   amLODipine (NORVASC) 5 MG tablet Take 5 mg by mouth daily.     aspirin 81 MG chewable tablet Place 1 tablet (81 mg total) into feeding tube daily. 30 tablet 0   atorvastatin (LIPITOR) 40 MG tablet Place 1 tablet (40 mg total) into feeding tube daily. 30 tablet 0   cetirizine (ZYRTEC) 10 MG tablet Take by mouth.     Chlorhexidine Gluconate Cloth 2 % PADS Apply 6 each topically daily. (Patient not taking: Reported on 09/14/2021) 1 each 0   diphenoxylate-atropine (LOMOTIL) 2.5-0.025 MG/5ML liquid Take 5 mLs by mouth 4 (four) times daily as needed for diarrhea or loose stools. (Patient not taking: Reported on 09/14/2021) 60 mL 0   enoxaparin (LOVENOX) 40 MG/0.4ML injection Inject 0.4 mLs (40 mg total) into the skin daily. (Patient not taking: Reported on 09/14/2021) 1 mL 0    famotidine (PEPCID) 20 MG tablet Take by mouth.     gabapentin (NEURONTIN) 100 MG capsule TAKE 2 CAPSULES BY MOUTH 3 TIMES DAILY.     HYDROcodone-acetaminophen (NORCO) 10-325 MG tablet Take 1 tablet by mouth every 6 (six) hours as needed. 60 tablet 0   ibuprofen (ADVIL) 100 MG/5ML suspension Place 30 mLs (600 mg total) into feeding tube every 6 (six) hours as needed for moderate pain. 200 mL 0   insulin aspart (NOVOLOG) 100 UNIT/ML injection Inject 0-9 Units into the skin every 4 (four) hours. (Patient not taking: Reported on 09/14/2021) 10 mL 11   labetalol (NORMODYNE) 5 MG/ML injection Inject 2 mLs (10 mg total) into the vein every 3 (three) hours as needed (SBP more than 160 or DBP more than 100). (Patient not taking: Reported on 09/14/2021) 20 mL 0   lenvatinib 20 mg daily dose (LENVIMA, 20 MG DAILY DOSE,) 2 x 10 MG capsule Take 2 capsules (20 mg total) by mouth daily. (Patient not taking: Reported on 09/14/2021) 60 capsule 0   levothyroxine (SYNTHROID) 50 MCG tablet Place 1 tablet (50 mcg total) into feeding tube daily at 6 (six) AM. 30 tablet 0   lidocaine-prilocaine (EMLA) cream Apply to affected area once 30 g 3   metoprolol succinate (TOPROL-XL) 25 MG 24 hr tablet Take 1 tablet (25 mg total) by mouth daily. 30 tablet 0   morphine 2 MG/ML injection Inject 1 mL (2 mg total) into the vein every 4 (four) hours as needed. (Patient not taking: Reported on 09/14/2021)  1 mL 0   Nutritional Supplements (FEEDING SUPPLEMENT, KATE FARMS STANDARD 1.4,) LIQD liquid Place 325 mLs into feeding tube 5 (five) times daily. 1000 mL 0   nystatin (MYCOSTATIN) 100000 UNIT/ML suspension Take 5 mLs (500,000 Units total) by mouth in the morning, at noon, and at bedtime. SWISH AND SPIT 473 mL 0   omeprazole (PRILOSEC) 20 MG capsule Take 1 capsule (20 mg total) by mouth daily. 90 capsule 1   ondansetron (ZOFRAN) 8 MG tablet Take 1 tablet (8 mg total) by mouth 2 (two) times daily as needed for refractory nausea /  vomiting. Start on day 3 after carboplatin chemo. 30 tablet 1   oxyCODONE (ROXICODONE) 5 MG/5ML solution PLACE 5-10 MLS (5-10 MG TOTAL) INTO FEEDING TUBE EVERY 4 (FOUR) HOURS AS NEEDED FOR SEVERE PAIN. (Patient not taking: Reported on 09/14/2021)     pantoprazole sodium (PROTONIX) 40 mg PACK Place 20 mLs (40 mg total) into feeding tube daily. 30 packet 0   predniSONE (DELTASONE) 50 MG tablet Take 1 tablet (50 mg total) by mouth daily with breakfast. 7 tablet 0   pregabalin (LYRICA) 150 MG capsule Take 1 capsule (150 mg total) by mouth 2 (two) times daily. 60 capsule 3   prochlorperazine (COMPAZINE) 10 MG tablet Take 1 tablet (10 mg total) by mouth every 6 (six) hours as needed (Nausea or vomiting). 30 tablet 1   senna-docusate (SENOKOT-S) 8.6-50 MG tablet Take 2 tablets by mouth daily. 60 tablet 3   sodium chloride 0.9 % infusion Inject 1,000 mLs into the vein as needed (for administration of IV medications (carrier fluid)). (Patient not taking: Reported on 09/14/2021) 1000 mL 0   sulfamethoxazole-trimethoprim (BACTRIM DS) 800-160 MG tablet Take 1 tablet (160 mg of trimethoprim total) by G tube once daily for 30 days     traMADol (ULTRAM) 50 MG tablet Place 1 tablet (50 mg total) into feeding tube every 6 (six) hours as needed for moderate pain. 30 tablet 0   Water For Irrigation, Sterile (FREE WATER) SOLN Place 120 mLs into feeding tube 5 (five) times daily. 300 mL 0   Current Facility-Administered Medications on File Prior to Encounter  Medication Dose Route Frequency Provider Last Rate Last Admin   sodium chloride flush (NS) 0.9 % injection 10 mL  10 mL Intravenous PRN Earlie Server, MD       sodium chloride flush (NS) 0.9 % injection 10 mL  10 mL Intravenous PRN Earlie Server, MD   10 mL at 10/13/20 1034    PSH:  Past Surgical History:  Procedure Laterality Date   COLONOSCOPY WITH PROPOFOL N/A 04/04/2017   Procedure: COLONOSCOPY WITH PROPOFOL;  Surgeon: Robert Bellow, MD;  Location: [facility] ENDOSCOPY;   Service: Endoscopy;  Laterality: N/A;   IR GASTROSTOMY TUBE MOD SED  09/24/2020   MANDIBLE SURGERY     PORT-A-CATH REMOVAL  07/17/2018   PORTA CATH INSERTION N/A 12/19/2017   Procedure: PORTA CATH INSERTION;  Surgeon: Algernon Huxley, MD;  Location: [place] CV LAB;  Service: Cardiovascular;  Laterality: N/A;   PORTA CATH INSERTION N/A 07/17/2018   Procedure: PORTA CATH INSERTION;  Surgeon: Algernon Huxley, MD;  Location: [place] [facility];  Service: Cardiovascular;  Laterality: N/A;   PORTA CATH INSERTION N/A 09/18/2019   Procedure: PORTA CATH INSERTION;  Surgeon: Algernon Huxley, MD;  Location: [place] [facility];  Service: Cardiovascular;  Laterality: N/A;   TONSILLECTOMY      PMH:  Past Medical History:  Diagnosis  Date   Arthritis    Cancer ([place])    Head and neck cancer   Hemorrhoids    Hyperlipidemia    Hypertension     Brief Physical Examination: There were no vitals filed for this visit. General: WD, WN male in NAD HEENT: Normocephalic, atraumatic Lungs: Respirations non-labored   Michaelle Birks, MD [facility] Radiology   Pager. [place]

## 2021-09-16 NOTE — Progress Notes (Signed)
[facility] [place], [place] [address] [phone]   Interval Evaluation  Date of Service: 09/16/21 Patient Name: Ivan Garcia [facility] Patient MRN: [id number] Patient DOB: [birth year] Provider: Ventura Sellers, MD  Identifying Statement:  Rockney Ghee Caedan Sumler. is a 65 y.o. 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     09/29/2019 - 04/06/2020 Chemotherapy   The patient had palonosetron (ALOXI) injection 0.25 mg, 0.25 mg, Intravenous,  Once, 6 of 6 cycles Administration: 0.25 mg (09/29/2019), 0.25 mg (12/22/2019), 0.25 mg (10/20/2019), 0.25 mg (12/01/2019), 0.25 mg (11/10/2019), 0.25 mg (01/12/2020) pegfilgrastim (NEULASTA ONPRO KIT) injection 6 mg, 6 mg, Subcutaneous, Once, 2 of 2 cycles Administration: 6 mg (12/26/2019), 6 mg (01/16/2020) pembrolizumab (KEYTRUDA) 200 mg in sodium chloride 0.9 % 50 mL chemo infusion, 200 mg (100 % of original dose 200 mg), Intravenous, Once, 10 of 10 cycles Dose modification: 200 mg (original dose 200 mg, Cycle 1, Reason: Provider Judgment) Administration: 200 mg (09/29/2019), 200 mg (10/20/2019), 200 mg (12/01/2019), 200 mg (12/22/2019), 200 mg (11/10/2019), 200 mg (01/12/2020), 200 mg (02/02/2020), 200 mg (02/23/2020), 200 mg (03/15/2020), 200 mg (04/06/2020) fosaprepitant (EMEND) 150 mg, dexamethasone (DECADRON) 12 mg in sodium chloride 0.9 % 145 mL IVPB, , Intravenous,  Once, 6 of 6 cycles Administration:  (09/29/2019),  (12/22/2019),  (10/20/2019),  (12/01/2019),  (11/10/2019),  (01/12/2020) fluorouracil (ADRUCIL) 7,900 mg in sodium chloride 0.9 % 92 mL chemo infusion, 1,000 mg/m2/day = 7,900 mg,  Intravenous, 4D (96 hours ), 6 of 6 cycles Dose modification: 900 mg/m2/day (original dose 1,000 mg/m2/day, Cycle 5, Reason: Dose not tolerated), 850 mg/m2/day (original dose 1,000 mg/m2/day, Cycle 5, Reason: Dose not tolerated) Administration: 7,900 mg (09/29/2019), 6,750 mg (12/22/2019), 7,900 mg (10/20/2019), 6,750 mg (12/01/2019), 7,150 mg (11/10/2019), 6,750 mg (01/12/2020) CARBOplatin (PARAPLATIN) 500 mg in sodium chloride 0.9 % 250 mL chemo infusion, 500 mg (100 % of original dose 499 mg), Intravenous,  Once, 6 of 6 cycles Dose modification:   (original dose 499 mg, Cycle 1),   (original dose 440 mg, Cycle 5) Administration: 500 mg (09/29/2019), 440 mg (10/20/2019), 390 mg (12/01/2019), 440 mg (11/10/2019), 430 mg (01/12/2020)   for chemotherapy treatment.     04/28/2020 - 04/28/2020 Chemotherapy   The patient had cetuximab (ERBITUX) chemo infusion 800 mg, 400 mg/m2, Intravenous, Once, 0 of 8 cycles  for chemotherapy treatment.     05/14/2020 - 05/14/2020 Chemotherapy   The patient had pembrolizumab (KEYTRUDA) 200 mg in sodium chloride 0.9 % 50 mL chemo infusion, 200 mg, Intravenous, Once, 1 of 4 cycles Administration: 200 mg (05/14/2020)   for chemotherapy treatment.     07/06/2020 - 09/09/2020 Chemotherapy          08/01/2021 -  Chemotherapy    Patient is on Treatment Plan: PEMBROLIZUMAB Q21D        CNS Oncologic History 10/25/20: R supraclavicular node treated with 40Gy by Dr. 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 presents today for follow up after further workup for acute right arm and shoulder weakness, starting 08/14/21.  He describes improvement in weakness  with aggressive rehab (still inpatient), particularly with his hand.  He is now able to grip objects.  The shoulder and elbow are still quite weak and no have no meaningful function.  The pain  has almost completely resolved, only modest residual numbness.  Still pending re-initiation of systemic therapy with Dr. Tasia Catchings.  H+P (06/17/21) Patient describes a 2 months history of right hand and arm pain, numbness.  There is also component of right hand weakness.  Pain and numbness are described as affecting "all fingertips" and also the palm.  The forearm he describes as "some numbness, but not painful".  For pain, he has been taking gabapentin 259m TID which has been ineffective.  The 303mdose caused sedation. The right hand has been much weaker lately, he is unable to hold a pick and strum carefully on his guitar.  His ability to button his shirt and tie shoes has been compromised.  These issues all seem to be worsening in recent weeks, despite no significant (neuropathically relevant) chemotherapy exposure since last fall.  He has had multiple cancer related issues in recent weeks, including dysphagia requiring PEG, recent cardiac arrest.  [facility] Medications: Current Outpatient Medications on File Prior to Visit  Medication Sig Dispense Refill   acetaminophen (TYLENOL) 160 MG/5ML solution Take by mouth.     ALPRAZolam (XANAX) 0.25 MG tablet Place 1 tablet (0.25 mg total) into feeding tube at bedtime as needed for anxiety. 30 tablet 0   amLODipine (NORVASC) 5 MG tablet Take 5 mg by mouth daily.     aspirin 81 MG chewable tablet Place 1 tablet (81 mg total) into feeding tube daily. 30 tablet 0   atorvastatin (LIPITOR) 40 MG tablet Place 1 tablet (40 mg total) into feeding tube daily. 30 tablet 0   cetirizine (ZYRTEC) 10 MG tablet Take by mouth.     Chlorhexidine Gluconate Cloth 2 % PADS Apply 6 each topically daily. (Patient not taking: Reported on 09/14/2021) 1 each 0   diphenoxylate-atropine (LOMOTIL) 2.5-0.025 MG/5ML liquid Take 5 mLs by mouth 4 (four) times daily as needed for diarrhea or loose stools. (Patient not taking: Reported on 09/14/2021)  60 mL 0   enoxaparin (LOVENOX) 40 MG/0.4ML injection Inject 0.4 mLs (40 mg total) into the skin daily. (Patient not taking: Reported on 09/14/2021) 1 mL 0   famotidine (PEPCID) 20 MG tablet Take by mouth.     gabapentin (NEURONTIN) 100 MG capsule TAKE 2 CAPSULES BY MOUTH 3 TIMES DAILY.     HYDROcodone-acetaminophen (NORCO) 10-325 MG tablet Take 1 tablet by mouth every 6 (six) hours as needed. 60 tablet 0   ibuprofen (ADVIL) 100 MG/5ML suspension Place 30 mLs (600 mg total) into feeding tube every 6 (six) hours as needed for moderate pain. 200 mL 0   insulin aspart (NOVOLOG) 100 UNIT/ML injection Inject 0-9 Units into the skin every 4 (four) hours. (Patient not taking: Reported on 09/14/2021) 10 mL 11   labetalol (NORMODYNE) 5 MG/ML injection Inject 2 mLs (10 mg total) into the vein every 3 (three) hours as needed (SBP more than 160 or DBP more than 100). (Patient not taking: Reported on 09/14/2021) 20 mL 0   lenvatinib 20 mg daily dose (LENVIMA, 20 MG DAILY DOSE,) 2 x 10 MG capsule Take 2 capsules (20 mg total) by mouth daily. (Patient not taking: Reported on 09/14/2021) 60 capsule 0   levothyroxine (SYNTHROID) 50 MCG tablet Place 1 tablet (50 mcg  total) into feeding tube daily at 6 (six) AM. 30 tablet 0   lidocaine-prilocaine (EMLA) cream Apply to affected area once 30 g 3   metoprolol succinate (TOPROL-XL) 25 MG 24 hr tablet Take 1 tablet (25 mg total) by mouth daily. 30 tablet 0   morphine 2 MG/ML injection Inject 1 mL (2 mg total) into the vein every 4 (four) hours as needed. (Patient not taking: Reported on 09/14/2021) 1 mL 0   Nutritional Supplements (FEEDING SUPPLEMENT, KATE FARMS STANDARD 1.4,) LIQD liquid Place 325 mLs into feeding tube 5 (five) times daily. 1000 mL 0   nystatin (MYCOSTATIN) 100000 UNIT/ML suspension Take 5 mLs (500,000 Units total) by mouth in the morning, at noon, and at bedtime. SWISH AND SPIT 473 mL 0   omeprazole (PRILOSEC) 20 MG capsule Take 1 capsule (20 mg total) by  mouth daily. 90 capsule 1   ondansetron (ZOFRAN) 8 MG tablet Take 1 tablet (8 mg total) by mouth 2 (two) times daily as needed for refractory nausea / vomiting. Start on day 3 after carboplatin chemo. 30 tablet 1   oxyCODONE (ROXICODONE) 5 MG/5ML solution PLACE 5-10 MLS (5-10 MG TOTAL) INTO FEEDING TUBE EVERY 4 (FOUR) HOURS AS NEEDED FOR SEVERE PAIN. (Patient not taking: Reported on 09/14/2021)     pantoprazole sodium (PROTONIX) 40 mg PACK Place 20 mLs (40 mg total) into feeding tube daily. 30 packet 0   predniSONE (DELTASONE) 50 MG tablet Take 1 tablet (50 mg total) by mouth daily with breakfast. 7 tablet 0   pregabalin (LYRICA) 150 MG capsule Take 1 capsule (150 mg total) by mouth 2 (two) times daily. 60 capsule 3   prochlorperazine (COMPAZINE) 10 MG tablet Take 1 tablet (10 mg total) by mouth every 6 (six) hours as needed (Nausea or vomiting). 30 tablet 1   senna-docusate (SENOKOT-S) 8.6-50 MG tablet Take 2 tablets by mouth daily. 60 tablet 3   sodium chloride 0.9 % infusion Inject 1,000 mLs into the vein as needed (for administration of IV medications (carrier fluid)). (Patient not taking: Reported on 09/14/2021) 1000 mL 0   sulfamethoxazole-trimethoprim (BACTRIM DS) 800-160 MG tablet Take 1 tablet (160 mg of trimethoprim total) by G tube once daily for 30 days     traMADol (ULTRAM) 50 MG tablet Place 1 tablet (50 mg total) into feeding tube every 6 (six) hours as needed for moderate pain. 30 tablet 0   Water For Irrigation, Sterile (FREE WATER) SOLN Place 120 mLs into feeding tube 5 (five) times daily. 300 mL 0   Current Facility-Administered Medications on File Prior to Visit  Medication Dose Route Frequency Provider Last Rate Last Admin   sodium chloride flush (NS) 0.9 % injection 10 mL  10 mL Intravenous PRN Earlie Server, MD       sodium chloride flush (NS) 0.9 % injection 10 mL  10 mL Intravenous PRN Earlie Server, MD   10 mL at 10/13/20 1034    Allergies:  Allergies  Allergen Reactions    Cetuximab Anaphylaxis   Past Medical History:  Past Medical History:  Diagnosis Date   Arthritis    Cancer ([place])    Head and neck cancer   Hemorrhoids    Hyperlipidemia    Hypertension    Past Surgical History:  Past Surgical History:  Procedure Laterality Date   COLONOSCOPY WITH PROPOFOL N/A 04/04/2017   Procedure: COLONOSCOPY WITH PROPOFOL;  Surgeon: Robert Bellow, MD;  Location: [facility] ENDOSCOPY;  Service: Endoscopy;  Laterality: N/A;  IR GASTROSTOMY TUBE MOD SED  09/24/2020   MANDIBLE SURGERY     PORT-A-CATH REMOVAL  07/17/2018   PORTA CATH INSERTION N/A 12/19/2017   Procedure: PORTA CATH INSERTION;  Surgeon: Algernon Huxley, MD;  Location: [place] [facility];  Service: Cardiovascular;  Laterality: N/A;   PORTA CATH INSERTION N/A 07/17/2018   Procedure: PORTA CATH INSERTION;  Surgeon: Algernon Huxley, MD;  Location: [place] [facility];  Service: Cardiovascular;  Laterality: N/A;   PORTA CATH INSERTION N/A 09/18/2019   Procedure: PORTA CATH INSERTION;  Surgeon: Algernon Huxley, MD;  Location: [place] [facility];  Service: Cardiovascular;  Laterality: N/A;   TONSILLECTOMY     Social History:  Social History   Socioeconomic History   Marital status: Single    Spouse name: Not on file   Number of children: Not on file   Years of education: Not on file   Highest education level: Not on file  Occupational History   Not on file  Tobacco Use   Smoking status: Never   Smokeless tobacco: Never  Vaping Use   Vaping Use: Never used  Substance and Sexual Activity   Alcohol use: No   Drug use: No   Sexual activity: Not on file  Other Topics Concern   Not on file  Social History Narrative   Not on file   Social Determinants of Health   Financial Resource Strain: Not on file  Food Insecurity: Not on file  Transportation Needs: Not on file  Physical Activity: Not on file  Stress: Not on file  Social Connections: Not on file  Intimate Partner Violence: Not on file    Family History:  Family History  Problem Relation Age of Onset   Breast cancer Mother    Asthma Mother    Congestive Heart Failure Mother    Prostate cancer Father    Brain cancer Father    Bladder Cancer Father     Review of Systems: Constitutional: Doesn't report fevers, chills or abnormal weight loss Eyes: Doesn't report blurriness of vision Ears, nose, mouth, throat, and face: Doesn't report sore throat Respiratory: Doesn't report cough, dyspnea or wheezes Cardiovascular: Doesn't report palpitation, chest discomfort  Gastrointestinal:  Doesn't report nausea, constipation, diarrhea GU: Doesn't report incontinence Skin: Doesn't report skin rashes Neurological: Per HPI Musculoskeletal: Doesn't report joint pain Behavioral/Psych: Doesn't report anxiety  Physical Exam: Vitals:   09/16/21 1109  BP: 114/77  Pulse: 98  Temp: (!) 97 F (36.1 C)   KPS: 70. General: Alert, cooperative, pleasant, in no acute distress Head: Normal EENT: No conjunctival injection or scleral icterus.  Lungs: Resp effort normal Cardiac: Regular rate Abdomen:PEG in place Skin: No rashes cyanosis or petechiae. Extremities: No clubbing or edema  Neurologic Exam: Mental Status: Awake, alert, attentive to examiner. Oriented to self and environment. Language is fluent with intact comprehension.  Cranial Nerves: Visual acuity is grossly normal. Visual fields are full. Extra-ocular movements intact. No ptosis. Face is symmetric Motor: Tone and bulk are normal. 1/5 at right shoulder and elbow, 3/5 in right hand. Reflexes are symmetric, no pathologic reflexes present.  Sensory: Patchy sensory impairment right hand Gait: Normal.   Labs: I have reviewed the data as listed    Component Value Date/Time   NA 129 (L) 09/14/2021 1332   K 4.7 09/14/2021 1332   CL 89 (L) 09/14/2021 1332   CO2 31 09/14/2021 1332   GLUCOSE 128 (H) 09/14/2021 1332   BUN 25 (H) 09/14/2021  1332   CREATININE 0.94  09/14/2021 1332   CALCIUM 8.2 (L) 09/14/2021 1332   PROT 6.2 (L) 09/14/2021 1332   ALBUMIN 3.1 (L) 09/14/2021 1332   AST 16 09/14/2021 1332   ALT 27 09/14/2021 1332   ALKPHOS 64 09/14/2021 1332   BILITOT 0.3 09/14/2021 1332   GFRNONAA >60 09/14/2021 1332   GFRAA 50 (L) 08/31/2020 1244   Lab Results  Component Value Date   WBC 6.9 09/14/2021   NEUTROABS 6.4 09/14/2021   HGB 10.8 (L) 09/14/2021   HCT 33.9 (L) 09/14/2021   MCV 96.3 09/14/2021   PLT 283 09/14/2021    Assessment/Plan Right brachial plexitis  Weakness of right upper extremity  [name]. Is clinically improved today with regards to right arm weakness.  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  This is further supported by improvement with corticosteroids.  We did recommend decreasing decadron to 63m daily, if tolerated, given risks with prolonged corticosteroids.    We also discussed trial of vitamin E and Trental, but recent data has not been encouraging: Sylvie E. Delanian, Timothee Lenglet, Thierry Maisonobe, Matthieu Resche-Rigon, Pierre-Francois Pradat, Randomized, Placebo-Controlled Clinical Trial Combining Pentoxifylline-Tocopherol and Clodronate in the Treatment of Radiation-Induced Plexopathy, International Journal of Radiation Oncology*Biology*Physics, Volume 107, Issue 1,2020,Pages 154-162,  Do not identify any concern with proceeding with immunotherapy if warranted.    No further neuro-muscular workup deemed necessary at this time.   We appreciate the opportunity to participate in the care of [employer].Marland KitchenHe will follow up with uKoreaas needed.  All questions were answered. The patient knows to call the clinic with any problems, questions or concerns. No barriers to learning were detected.  The total time spent in the encounter was 30 minutes  and more than 50% was on counseling and review of test results   ZVentura Sellers MD Medical Director of Neuro-Oncology [facility]at WTukw[place]10/14/22 11:08 AM

## 2021-09-16 NOTE — Procedures (Signed)
Vascular and Interventional Radiology Procedure Note  Patient: Ivan Garcia. DOB: 1956/05/17 Medical Record Number: [id number] Note Date/Time: 09/16/21 10:42 AM   Performing Physician: Michaelle Birks, MD Assistant(s): None  Diagnosis: Catheter malfunction.  Procedure: GASTROSTOMY TUBE EXCHANGE  Anesthesia:  None Complications: None Estimated Blood Loss:  0 mL  Findings:  - Discolored and degraded G-tube. - Mushroom bumper broke from tube and retained in stomach. - Successful exchange of a 49F gastrostomy tube.  See detailed procedure note with images in PACS. The patient tolerated the procedure well without incident or complication and was returned to Recovery in stable condition.    Michaelle Birks, MD Vascular and Interventional Radiology [facility] Radiology   Pager. [place]

## 2021-09-16 NOTE — Telephone Encounter (Signed)
Per secure chat from Dr. Tasia Catchings : please check with him if he is still on levothyroxin. need refills? this was given by hospitalist. I recommend him to continue. ok to refill if he needs. also let him know that I talked to S[facility] whose office will call him to have another visit to discuss his options.   Contacted patient and he confirmed that he is taking levothyroxine, no refills needed. Also informed him that he will receive a call from Dr. Mariel Kansky' office for another appt. Pt voiced understanding.

## 2021-09-17 ENCOUNTER — Other Ambulatory Visit: Payer: Self-pay

## 2021-09-17 ENCOUNTER — Emergency Department
Admission: EM | Admit: 2021-09-17 | Discharge: 2021-09-17 | Disposition: A | Discharge status: Skilled Nursing Facility | Payer: Medicare Other | Attending: Emergency Medicine | Admitting: Emergency Medicine

## 2021-09-17 DIAGNOSIS — Z79899 Other long term (current) drug therapy: Secondary | ICD-10-CM | POA: Insufficient documentation

## 2021-09-17 DIAGNOSIS — N1831 Chronic kidney disease, stage 3a: Secondary | ICD-10-CM | POA: Insufficient documentation

## 2021-09-17 DIAGNOSIS — I129 Hypertensive chronic kidney disease with stage 1 through stage 4 chronic kidney disease, or unspecified chronic kidney disease: Secondary | ICD-10-CM | POA: Diagnosis not present

## 2021-09-17 DIAGNOSIS — Z7982 Long term (current) use of aspirin: Secondary | ICD-10-CM | POA: Diagnosis not present

## 2021-09-17 DIAGNOSIS — K9423 Gastrostomy malfunction: Secondary | ICD-10-CM | POA: Diagnosis not present

## 2021-09-17 DIAGNOSIS — Z8589 Personal history of malignant neoplasm of other organs and systems: Secondary | ICD-10-CM | POA: Diagnosis not present

## 2021-09-17 NOTE — Discharge Instructions (Addendum)
Please have the facility help you with flushing and feeding until you can get your tube replaced.  Please call the clinic at 8 AM tomorrow morning or the IR nurse to help facilitate getting this exchanged on Monday.  If there is issues with it getting scheduled outpatient like if he can get a hold of anyone and then come back to the ER early in the morning and they can see if they can try to get it figured out then.  Unfortunately will not replace it on the weekend   Clinic. [phone] ? This was number listed in the doctors note that did it yesterday but u can ask for it to be done at [facility]   Call IR nurse number [phone]

## 2021-09-17 NOTE — ED Notes (Addendum)
Report called to [facility] RN [employer]

## 2021-09-17 NOTE — ED Notes (Signed)
DC ppw provided, pt given IR numbers to call on Monday for replacement Gtube procedure. Pt assisted to [place] via wheelchair with friend

## 2021-09-17 NOTE — ED Provider Notes (Signed)
[facility] Emergency Department Provider Note  ____________________________________________   Event Date/Time   First MD Initiated Contact with Patient 09/17/21 1032     (approximate)  I have reviewed the triage vital signs and the nursing notes.   HISTORY  Chief Complaint g tube replacement    HPI Ivan Garcia. is a 65 y.o. male with hypertension, hyperlipidemia, aspiration he is got G-tube who states that he needs it clamped for it.  Reports it was replaced yesterday.  Has had the G-tube for 1 year.  On review of records patient had  R guided G-tube exchange.  Patient states that he is having issues with his G-tube because it does not have the clamp that is near the end of it to help get all of the food out of it.  He states he is not able to flush it himself because his right arm is in splint and this is how he typically is able to clean the line.  He also states that the tubing is too short and therefore he cannot feed himself with it due to his arm.  Patient is currently at a care facility and that he can have them help with the until Monday.  However he supposed be discharged on Monday so he wants to make sure it is fixed before he goes home given he cannot do it on his own.  However it is still functionable just more inconvenient right now.          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   Goals of care, counseling/discussion 12/10/2017   Encounter for screening colonoscopy 02/28/2017    Past Surgical History:  Procedure Laterality Date   COLONOSCOPY WITH PROPOFOL N/A 04/04/2017   Procedure: COLONOSCOPY WITH PROPOFOL;  Surgeon: Robert Bellow, MD;  Location: [facility] ENDOSCOPY;  Service: Endoscopy;  Laterality: N/A;   IR GASTROSTOMY TUBE MOD SED  09/24/2020   IR REPLACE G-TUBE SIMPLE WO FLUORO  09/16/2021   MANDIBLE SURGERY     PORT-A-CATH REMOVAL  07/17/2018   PORTA CATH INSERTION N/A 12/19/2017   Procedure: PORTA CATH INSERTION;  Surgeon: Algernon Huxley, MD;  Location: [place] CV LAB;  Service: Cardiovascular;  Laterality: N/A;   PORTA CATH INSERTION N/A 07/17/2018   Procedure: PORTA CATH  INSERTION;  Surgeon: Algernon Huxley, MD;  Location: [place] CV LAB;  Service: Cardiovascular;  Laterality: N/A;   PORTA CATH INSERTION N/A 09/18/2019   Procedure: PORTA CATH INSERTION;  Surgeon: Algernon Huxley, MD;  Location: [place] CV LAB;  Service: Cardiovascular;  Laterality: N/A;   TONSILLECTOMY      Prior to Admission medications   Medication Sig Start Date End Date Taking? Authorizing Provider  acetaminophen (TYLENOL) 160 MG/5ML solution Take by mouth. 05/13/21   [provider]  ALPRAZolam Duanne Moron) 0.25 MG tablet Place 1 tablet (0.25 mg total) into feeding tube at bedtime as needed  for anxiety. 08/13/21   Mansy, Jan A, MD  amLODipine (NORVASC) 5 MG tablet Take 5 mg by mouth daily.    [provider]  aspirin 81 MG chewable tablet Place 1 tablet (81 mg total) into feeding tube daily. 08/14/21   Mansy, Arvella Merles, MD  atorvastatin (LIPITOR) 40 MG tablet Place 1 tablet (40 mg total) into feeding tube daily. 08/14/21   Mansy, Arvella Merles, MD  cetirizine (ZYRTEC) 10 MG tablet Take by mouth.    [provider]  Chlorhexidine Gluconate Cloth 2 % PADS Apply 6 each topically daily. Patient not taking: No sig reported 08/14/21   Mansy, Arvella Merles, MD  diphenoxylate-atropine (LOMOTIL) 2.5-0.025 MG/5ML liquid Take 5 mLs by mouth 4 (four) times daily as needed for diarrhea or loose stools. Patient not taking: No sig reported 08/13/21   Mansy, Jan A, MD  enoxaparin (LOVENOX) 40 MG/0.4ML injection Inject 0.4 mLs (40 mg total) into the skin daily. Patient not taking: No sig reported 08/14/21   Mansy, Arvella Merles, MD  famotidine (PEPCID) 20 MG tablet Take by mouth. 04/29/21   [provider]  gabapentin (NEURONTIN) 100 MG capsule TAKE 2 CAPSULES BY MOUTH 3 TIMES DAILY. 05/31/21   [provider]  HYDROcodone-acetaminophen (NORCO) 10-325 MG tablet Take 1 tablet by mouth every 6 (six) hours as needed. 07/25/21   Borders, Kirt Boys, NP  ibuprofen (ADVIL) 100 MG/5ML suspension Place 30 mLs (600 mg total) into feeding tube every 6 (six) hours as needed for moderate pain. 08/13/21   Mansy, Jan A, MD  insulin aspart (NOVOLOG) 100 UNIT/ML injection Inject 0-9 Units into the skin every 4 (four) hours. Patient not taking: No sig reported 08/14/21   Mansy, Arvella Merles, MD  labetalol (NORMODYNE) 5 MG/ML injection Inject 2 mLs (10 mg total) into the vein every 3 (three) hours as needed (SBP more than 160 or DBP more than 100). Patient not taking: No sig reported 08/13/21   Mansy, Jan A, MD  lenvatinib 20 mg daily dose (LENVIMA, 20 MG DAILY DOSE,) 2 x 10 MG capsule Take 2 capsules (20 mg total) by mouth  daily. Patient not taking: No sig reported 07/27/21   Earlie Server, MD  levothyroxine (SYNTHROID) 50 MCG tablet Place 1 tablet (50 mcg total) into feeding tube daily at 6 (six) AM. 08/14/21   Mansy, Arvella Merles, MD  lidocaine-prilocaine (EMLA) cream Apply to affected area once 09/10/19   Earlie Server, MD  metoprolol succinate (TOPROL-XL) 25 MG 24 hr tablet Take 1 tablet (25 mg total) by mouth daily. 08/14/21   Mansy, Jan A, MD  morphine 2 MG/ML injection Inject 1 mL (2 mg total) into the vein every 4 (four) hours as needed. Patient not taking: No sig reported 08/13/21   Mansy, Arvella Merles, MD  Nutritional Supplements (FEEDING SUPPLEMENT, KATE FARMS STANDARD 1.4,) LIQD liquid Place 325 mLs  into feeding tube 5 (five) times daily. 08/13/21   Mansy, Arvella Merles, MD  nystatin (MYCOSTATIN) 100000 UNIT/ML suspension Take 5 mLs (500,000 Units total) by mouth in the morning, at noon, and at bedtime. SWISH AND SPIT 08/17/20   Earlie Server, MD  omeprazole (PRILOSEC) 20 MG capsule Take 1 capsule (20 mg total) by mouth daily. 03/03/21   Earlie Server, MD  ondansetron (ZOFRAN) 8 MG tablet Take 1 tablet (8 mg total) by mouth 2 (two) times daily as needed for refractory nausea / vomiting. Start on day 3 after carboplatin chemo. 11/04/20   Earlie Server, MD  oxyCODONE (ROXICODONE) 5 MG/5ML solution PLACE 5-10 MLS (5-10 MG TOTAL) INTO FEEDING TUBE EVERY 4 (FOUR) HOURS AS NEEDED FOR SEVERE PAIN. Patient not taking: No sig reported 05/17/21   [provider]  pantoprazole sodium (PROTONIX) 40 mg PACK Place 20 mLs (40 mg total) into feeding tube daily. 08/14/21   Mansy, Arvella Merles, MD  predniSONE (DELTASONE) 20 MG tablet Take 1 tablet (20 mg total) by mouth daily with breakfast. 09/16/21   Vaslow, Acey Lav, MD  pregabalin (LYRICA) 150 MG capsule Take 1 capsule (150 mg total) by mouth 2 (two) times daily. 07/08/21   Ventura Sellers, MD  prochlorperazine (COMPAZINE) 10 MG tablet Take 1 tablet (10 mg total) by mouth every 6 (six) hours as needed (Nausea or vomiting).  09/10/19   Earlie Server, MD  senna-docusate (SENOKOT-S) 8.6-50 MG tablet Take 2 tablets by mouth daily. 05/27/21   Earlie Server, MD  sodium chloride 0.9 % infusion Inject 1,000 mLs into the vein as needed (for administration of IV medications (carrier fluid)). Patient not taking: No sig reported 08/13/21   Mansy, Arvella Merles, MD  sulfamethoxazole-trimethoprim (BACTRIM DS) 800-160 MG tablet Take 1 tablet (160 mg of trimethoprim total) by G tube once daily for 30 days 08/29/21 09/28/21  [provider]  traMADol (ULTRAM) 50 MG tablet Place 1 tablet (50 mg total) into feeding tube every 6 (six) hours as needed for moderate pain. 08/13/21   Mansy, Arvella Merles, MD  Water For Irrigation, Sterile (FREE WATER) SOLN Place 120 mLs into feeding tube 5 (five) times daily. 08/13/21   Mansy, Arvella Merles, MD    Allergies Cetuximab  Family History  Problem Relation Age of Onset   Breast cancer Mother    Asthma Mother    Congestive Heart Failure Mother    Prostate cancer Father    Brain cancer Father    Bladder Cancer Father     Social History Social History   Tobacco Use   Smoking status: Never   Smokeless tobacco: Never  Vaping Use   Vaping Use: Never used  Substance Use Topics   Alcohol use: No   Drug use: No      Review of Systems Constitutional: No fever/chills Eyes: No visual changes. ENT: No sore throat. Cardiovascular: Denies chest pain. Respiratory: Denies shortness of breath. Gastrointestinal: No abdominal pain.  No nausea, no vomiting.  No diarrhea.  No constipation.  Issues with G-tube Genitourinary: Negative for dysuria. Musculoskeletal: Negative for back pain. Skin: Negative for rash. Neurological: Negative for headaches, focal weakness or numbness. All other ROS negative ____________________________________________   PHYSICAL EXAM:  VITAL SIGNS: ED Triage Vitals  Enc Vitals Group     BP 09/17/21 1027 117/80     Pulse Rate 09/17/21 1027 99     Resp 09/17/21 1027 18     Temp 09/17/21  1031 97.7 F (36.5 C)  Temp Source 09/17/21 1031 Oral     SpO2 09/17/21 1027 93 %     Weight 09/17/21 1028 180 lb (81.6 kg)     Height 09/17/21 1028 5\' 9"  (1.753 m)     Head Circumference --      Peak Flow --      Pain Score 09/17/21 1028 0     Pain Loc --      Pain Edu? --      Excl. in Vill[place]? --     Constitutional: Alert and oriented. Well appearing and in no acute distress. Eyes: Conjunctivae are normal. EOMI. Head: Atraumatic. Nose: No congestion/rhinnorhea. Mouth/Throat: Mucous membranes are moist.   Neck: No stridor. Trachea Midline. FROM Cardiovascular: Normal rate, good circulation Respiratory: Normal work of breathing Gastrointestinal: Soft and nontender. No distention. No abdominal bruits.  G-tube noted to the stomach no erythema, redness Musculoskeletal: No lower extremity tenderness nor edema.  No joint effusions. Neurologic:  Normal speech and language. No gross focal neurologic deficits are appreciated.  Skin:  Skin is warm, dry and intact. No rash noted. Psychiatric: Mood and affect are normal. Speech and behavior are normal. GU: Deferred   ____________________________________________    INITIAL IMPRESSION / ASSESSMENT AND PLAN / ED COURSE  Ivan Ghee [place]. was evaluated in Emergency Department on 09/17/2021 for the symptoms described in the history of present illness. He was evaluated in the context of the global COVID-19 pandemic, which necessitated consideration that the patient might be at risk for infection with the SARS-CoV-2 virus that causes COVID-19. Institutional protocols and algorithms that pertain to the evaluation of patients at risk for COVID-19 are in a state of rapid change based on information released by regulatory bodies including the CDC and federal and state organizations. These policies and algorithms were followed during the patient's care in the ED.    Patient comes in with concern for his G-tube length, needing a specific clamp for  it.  We are unable to provide these here.  I discussed with the IR Dr. To see if this is not they can replace.  He states that the clamp is so that is automatically a part of the tube is a backflow valve.  I talked to Dr. Reesa Chew from the IR who stated that they would not be doing any procedure that was not emergent and that they would have to wait until Monday.  He is not sure how to help schedule it for Monday and just recommended them calling on Monday.  11:12 AM attempted to also call patient's IR number that had done the actual placement of the G-tube unfortunately not letting me leave a message and every time I pushed the button to talk to and on call Dr. Over there it will not redirect.  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    Vanessa Monmouth, MD 09/17/21 [phone]

## 2021-09-17 NOTE — ED Triage Notes (Signed)
Pt to ED [facility] for g tube evaluation, states he needs a clamp for it.  Had tube replaced yesterday, has had g tube for one year.  Also says feeding tube is 6 inches shorter than other one

## 2021-09-17 NOTE — ED Notes (Signed)
Pt ambulatory to restroom

## 2021-09-17 NOTE — ED Notes (Signed)
MD at bedside. 

## 2021-09-22 ENCOUNTER — Telehealth: Payer: Self-pay | Admitting: *Deleted

## 2021-09-22 NOTE — Telephone Encounter (Signed)
Patient called stating he still has not heard from [facility] and it has been 2 weeks. He is asking when Dr Tasia Catchings is going to restart his treatment and would like a return call to discuss. He has no follow up scheduled at this time with Medical Oncology.

## 2021-09-23 ENCOUNTER — Encounter: Payer: Self-pay | Admitting: Oncology

## 2021-09-23 ENCOUNTER — Other Ambulatory Visit: Payer: Self-pay | Admitting: *Deleted

## 2021-09-23 NOTE — Telephone Encounter (Signed)
Per patient he now has an appointment Monday at [facility]

## 2021-09-23 NOTE — Telephone Encounter (Signed)
Patient request refill he does not need and will go to pharmacy to pick it up

## 2021-09-26 ENCOUNTER — Telehealth: Payer: Self-pay | Admitting: *Deleted

## 2021-09-26 NOTE — Telephone Encounter (Addendum)
Patient called stating that he saw Dr Mariel Kansky today and that he needs an appointment with Dr Tasia Catchings ASAP for treatment and that Dr Mariel Kansky will be sending Dr Tasia Catchings records

## 2021-09-28 NOTE — Telephone Encounter (Signed)
Please schedule patient for Lab/MD/ methotrexate *new* this week or next and notify pt of appt.

## 2021-09-28 NOTE — Telephone Encounter (Signed)
Patient informed that Dr Tasia Catchings wants to wait to hear back from Dr Mariel Kansky before scheduling appointment. He states that he hopes he can get treatment next week, but understands her waiting for records   Per Care Everywhere "Patient Instructions Karie Fetch, MD - 09/26/2021 1:30 PM EDT   Formatting of this note might be different from the original. It was a pleasure seeing you again.  At this time we would recommend treating you with either cetuximab following a inpatient de-sensitization protocol versus methotrexate. Drug information has been included below. If you opt for cetuximab, we would like to desensitize you here at [facility] and perhaps give you your first 1 or 2 doses as an outpatient in our infusion center. After this you can receive subsequent doses under the direction of Dr. Tasia Catchings.  For appointments & questions Monday through Friday 8 AM--5 PM   Please call [phone] or Toll free [phone]  For urgent clinical needs on Nights, Weekends or Holidays Call [phone] and ask for the oncologist on call.   For appointment changes please contact during normal business hours.   Please visit [email or web address]/, a resource created just for family members and caregivers. This website lists support services, how and where to ask for help. It has tools to assist you as you help [place] care for your loved one.  [facility] [address] [place], [place] [address] [email or web address]  Electronically signed by Karie Fetch, MD at 09/26/2021 2:22 PM EDT "

## 2021-09-29 ENCOUNTER — Other Ambulatory Visit: Payer: Self-pay | Admitting: *Deleted

## 2021-09-29 ENCOUNTER — Encounter: Payer: Self-pay | Admitting: Oncology

## 2021-09-29 ENCOUNTER — Other Ambulatory Visit: Payer: Self-pay | Admitting: Oncology

## 2021-09-29 DIAGNOSIS — Z5111 Encounter for antineoplastic chemotherapy: Secondary | ICD-10-CM

## 2021-09-29 DIAGNOSIS — C109 Malignant neoplasm of oropharynx, unspecified: Secondary | ICD-10-CM

## 2021-09-29 MED ORDER — LEVOTHYROXINE SODIUM 50 MCG PO TABS: 50.0000 ug | ORAL_TABLET | Freq: Every day | ORAL | 3 refills | Status: DC

## 2021-09-29 NOTE — Progress Notes (Signed)
DISCONTINUE OFF PATHWAY REGIMEN - Head and Neck   [id number]:Pembrolizumab 200 mg IV D1 q21 Days:   A cycle is every 21 days:     Pembrolizumab   **Always confirm dose/schedule in your pharmacy ordering system**  REASON: Toxicities / Adverse Event PRIOR TREATMENT: Off Pathway: Pembrolizumab 200 mg IV D1 q21 Days TREATMENT RESPONSE: Progressive Disease (PD)  START OFF PATHWAY REGIMEN - Head and Neck   [id number]:Methotrexate 40 mg/m2 Weekly:   Administer Weekly:     Methotrexate   **Always confirm dose/schedule in your pharmacy ordering system**  Patient Characteristics: Oropharynx, HPV Positive, Metastatic, Fourth Line and Beyond Disease Classification: Oropharynx HPV Status: Positive (+) Therapeutic Status: Metastatic Disease Line of Therapy: Fourth Line and Beyond  Intent of Therapy: Non-Curative / Palliative Intent, Discussed with Patient

## 2021-10-05 ENCOUNTER — Ambulatory Visit: Payer: Medicare Other

## 2021-10-05 ENCOUNTER — Inpatient Hospital Stay (HOSPITAL_BASED_OUTPATIENT_CLINIC_OR_DEPARTMENT_OTHER): Payer: Medicare Other | Admitting: Oncology

## 2021-10-05 ENCOUNTER — Inpatient Hospital Stay: Payer: Medicare Other

## 2021-10-05 ENCOUNTER — Inpatient Hospital Stay: Payer: Medicare Other | Attending: Oncology

## 2021-10-05 ENCOUNTER — Other Ambulatory Visit: Payer: Self-pay

## 2021-10-05 ENCOUNTER — Encounter: Payer: Self-pay | Admitting: Oncology

## 2021-10-05 VITALS — BP 106/70 | HR 103 | Temp 97.7°F | Wt 181.8 lb

## 2021-10-05 VITALS — BP 121/80 | HR 85 | Resp 16

## 2021-10-05 DIAGNOSIS — C770 Secondary and unspecified malignant neoplasm of lymph nodes of head, face and neck: Secondary | ICD-10-CM | POA: Diagnosis not present

## 2021-10-05 DIAGNOSIS — E039 Hypothyroidism, unspecified: Secondary | ICD-10-CM | POA: Insufficient documentation

## 2021-10-05 DIAGNOSIS — Z931 Gastrostomy status: Secondary | ICD-10-CM | POA: Diagnosis not present

## 2021-10-05 DIAGNOSIS — Z803 Family history of malignant neoplasm of breast: Secondary | ICD-10-CM | POA: Insufficient documentation

## 2021-10-05 DIAGNOSIS — B379 Candidiasis, unspecified: Secondary | ICD-10-CM | POA: Diagnosis not present

## 2021-10-05 DIAGNOSIS — Z808 Family history of malignant neoplasm of other organs or systems: Secondary | ICD-10-CM | POA: Diagnosis not present

## 2021-10-05 DIAGNOSIS — B370 Candidal stomatitis: Secondary | ICD-10-CM

## 2021-10-05 DIAGNOSIS — Z5111 Encounter for antineoplastic chemotherapy: Secondary | ICD-10-CM

## 2021-10-05 DIAGNOSIS — C109 Malignant neoplasm of oropharynx, unspecified: Secondary | ICD-10-CM | POA: Diagnosis not present

## 2021-10-05 DIAGNOSIS — Z8052 Family history of malignant neoplasm of bladder: Secondary | ICD-10-CM | POA: Diagnosis not present

## 2021-10-05 DIAGNOSIS — K223 Perforation of esophagus: Secondary | ICD-10-CM | POA: Diagnosis not present

## 2021-10-05 DIAGNOSIS — G540 Brachial plexus disorders: Secondary | ICD-10-CM | POA: Insufficient documentation

## 2021-10-05 DIAGNOSIS — Z79899 Other long term (current) drug therapy: Secondary | ICD-10-CM | POA: Diagnosis not present

## 2021-10-05 DIAGNOSIS — K769 Liver disease, unspecified: Secondary | ICD-10-CM | POA: Insufficient documentation

## 2021-10-05 LAB — COMPREHENSIVE METABOLIC PANEL
ALT: 21 U/L (ref 0–44)
AST: 20 U/L (ref 15–41)
Albumin: 3 g/dL — ABNORMAL LOW (ref 3.5–5.0)
Alkaline Phosphatase: 58 U/L (ref 38–126)
Anion gap: 7 (ref 5–15)
BUN: 23 mg/dL (ref 8–23)
CO2: 32 mmol/L (ref 22–32)
Calcium: 8.7 mg/dL — ABNORMAL LOW (ref 8.9–10.3)
Chloride: 94 mmol/L — ABNORMAL LOW (ref 98–111)
Creatinine, Ser: 0.82 mg/dL (ref 0.61–1.24)
GFR, Estimated: >60 mL/min (ref >60)
Glucose, Bld: 155 mg/dL — ABNORMAL HIGH (ref 70–99)
Potassium: 4.2 mmol/L (ref 3.5–5.1)
Sodium: 133 mmol/L — ABNORMAL LOW (ref 135–145)
Total Bilirubin: 0.6 mg/dL (ref 0.3–1.2)
Total Protein: 5.8 g/dL — ABNORMAL LOW (ref 6.5–8.1)

## 2021-10-05 LAB — T4, FREE: Free T4: 0.57 ng/dL — ABNORMAL LOW (ref 0.61–1.12)

## 2021-10-05 LAB — CBC WITH DIFFERENTIAL/PLATELET
Abs Immature Granulocytes: 0.06 10*3/uL (ref 0.00–0.07)
Basophils Absolute: 0.1 10*3/uL (ref 0.0–0.1)
Basophils Relative: 1 %
Eosinophils Absolute: 0.1 10*3/uL (ref 0.0–0.5)
Eosinophils Relative: 0 %
HCT: 34.7 % — ABNORMAL LOW (ref 39.0–52.0)
Hemoglobin: 10.9 g/dL — ABNORMAL LOW (ref 13.0–17.0)
Immature Granulocytes: 1 %
Lymphocytes Relative: 3 %
Lymphs Abs: 0.3 10*3/uL — ABNORMAL LOW (ref 0.7–4.0)
MCH: 30.4 pg (ref 26.0–34.0)
MCHC: 31.4 g/dL (ref 30.0–36.0)
MCV: 96.9 fL (ref 80.0–100.0)
Monocytes Absolute: 0.6 10*3/uL (ref 0.1–1.0)
Monocytes Relative: 5 %
Neutro Abs: 10.1 10*3/uL — ABNORMAL HIGH (ref 1.7–7.7)
Neutrophils Relative %: 90 %
Platelets: 424 10*3/uL — ABNORMAL HIGH (ref 150–400)
RBC: 3.58 MIL/uL — ABNORMAL LOW (ref 4.22–5.81)
RDW: 16.6 % — ABNORMAL HIGH (ref 11.5–15.5)
WBC: 11.2 10*3/uL — ABNORMAL HIGH (ref 4.0–10.5)
nRBC: 0 % (ref 0.0–0.2)

## 2021-10-05 LAB — TSH: TSH: 55 u[IU]/mL — ABNORMAL HIGH (ref 0.350–4.500)

## 2021-10-05 MED ORDER — ONDANSETRON HCL 8 MG PO TABS: 8.0000 mg | ORAL_TABLET | Freq: Two times a day (BID) | ORAL | 1 refills | Status: AC | PRN

## 2021-10-05 MED ORDER — NYSTATIN 100000 UNIT/ML MT SUSP: 5.0000 mL | Freq: Three times a day (TID) | OROMUCOSAL | 1 refills | Status: AC

## 2021-10-05 MED ORDER — METHOTREXATE SODIUM (PF) CHEMO INJECTION 250 MG/10ML
40.2000 mg/m2 | Freq: Once | INTRAMUSCULAR | Status: AC
  Administered 2021-10-05: 80 mg via INTRAVENOUS
  Filled 2021-10-05: qty 3.2

## 2021-10-05 MED ORDER — SODIUM CHLORIDE 0.9 % IV SOLN
INTRAVENOUS | Status: DC
  Filled 2021-10-05: qty 250

## 2021-10-05 MED ORDER — PROCHLORPERAZINE MALEATE 10 MG PO TABS: 10.0000 mg | ORAL_TABLET | Freq: Once | ORAL | Status: DC

## 2021-10-05 MED ORDER — SODIUM CHLORIDE 0.9% FLUSH
10.0000 mL | INTRAVENOUS | Status: DC | PRN
  Administered 2021-10-05: 10 mL via INTRAVENOUS
  Filled 2021-10-05: qty 10

## 2021-10-05 MED ORDER — FOLIC ACID 1 MG PO TABS: 1.0000 mg | ORAL_TABLET | Freq: Every day | ORAL | 1 refills | Status: AC

## 2021-10-05 MED ORDER — HEPARIN SOD (PORK) LOCK FLUSH 100 UNIT/ML IV SOLN
INTRAVENOUS | Status: AC
  Filled 2021-10-05: qty 5

## 2021-10-05 MED ORDER — HEPARIN SOD (PORK) LOCK FLUSH 100 UNIT/ML IV SOLN
500.0000 [IU] | Freq: Once | INTRAVENOUS | Status: AC
  Administered 2021-10-05: 500 [IU] via INTRAVENOUS
  Filled 2021-10-05: qty 5

## 2021-10-05 MED ORDER — LEVOTHYROXINE SODIUM 88 MCG PO TABS: 88.0000 ug | ORAL_TABLET | Freq: Every day | ORAL | 0 refills | Status: AC

## 2021-10-05 MED ORDER — PROCHLORPERAZINE EDISYLATE 10 MG/2ML IJ SOLN
10.0000 mg | Freq: Once | INTRAMUSCULAR | Status: AC
  Administered 2021-10-05: 10 mg via INTRAVENOUS
  Filled 2021-10-05: qty 2

## 2021-10-05 NOTE — Progress Notes (Signed)
Ok per Dr Tasia Catchings to tx with HR 103.  At 1020 -pt states that " he feels like  he is going to pass out . states very groggy and right eye is blurry " . Only thing that pt had received was Compazine 10 mg IV at 0950. MD aware . Vital signs stable . NS fluids going. Per MD - monitor. MD states will not give compazine in future - will switch to zofran. Pt also did tube feeding while here with assistance of Joli.   Per Md  advise pt that his thyroid function is low and  will give him another Rx. Advise to stop 43mcg dose and  she will give him a new Rx of 66mcg daily. Pt aware and verbalized understanding.    Kept patient for an hour after completed with treatment until the groggy feeling resolved as pt had drove himself and did not have anyone to drive himself.  Pt states at 1200 that he felt much better and was good to drive. Pt discharged stable.

## 2021-10-05 NOTE — Patient Instructions (Signed)
[facility]  Discharge Instructions: Thank you for choosing [facility] to provide your oncology and hematology care.  If you have a lab appointment with the [facility], please go directly to the [facility] and check in at the registration area.  Wear comfortable clothing and clothing appropriate for easy access to any Portacath or PICC line.   We strive to give you quality time with your provider. You may need to reschedule your appointment if you arrive late (15 or more minutes).  Arriving late affects you and other patients whose appointments are after yours.  Also, if you miss three or more appointments without notifying the office, you may be dismissed from the clinic at the provider's discretion.      For prescription refill requests, have your pharmacy contact our office and allow 72 hours for refills to be completed.    Today you received the following chemotherapy and/or immunotherapy agents: Methotrexate    Methotrexate Injection What is this medication? METHOTREXATE (METH oh TREX ate) treats inflammatory conditions such as arthritis and psoriasis. It works by decreasing inflammation, which can reduce pain and prevent long-term injury to the joints and skin. It may also be used to treat some types of cancer. It works by slowing down the growth of cancer cells. This medicine may be used for other purposes; ask your health care provider or pharmacist if you have questions. What should I tell my care team before I take this medication? They need to know if you have any of these conditions: Fluid in the stomach area or lungs If you often drink alcohol Infection or immune system problems Kidney disease Liver disease Low blood counts (white cells, platelets, or red blood cells) Lung disease Recent or ongoing radiation Recent or upcoming vaccine Stomach ulcers Ulcerative colitis An unusual or allergic reaction to methotrexate,  other medications, foods, dyes, or preservatives Pregnant or trying to get pregnant Breast-feeding How should I use this medication? This medication is for infusion into a vein or for injection into muscle or into the spinal fluid (whichever applies). It is usually given in a hospital or clinic setting. In rare cases, you might get this medication at home. You will be taught how to give this medication. Use exactly as directed. Take your medication at regular intervals. Do not take your medication more often than directed. If this medication is used for arthritis or psoriasis, it should be taken weekly, NOT daily. It is important that you put your used needles and syringes in a special sharps container. Do not put them in a trash can. If you do not have a sharps container, call your pharmacist or care team to get one. Talk to your care team about the use of this medication in children. While this medication may be prescribed for children as young as 2 years for selected conditions, precautions do apply. Overdosage: If you think you have taken too much of this medicine contact a poison control center or emergency room at once. NOTE: This medicine is only for you. Do not share this medicine with others. What if I miss a dose? It is important not to miss your dose. Call your care team if you are unable to keep an appointment. If you give yourself the medication, and you miss a dose, talk with your care team. Do not take double or extra doses. What may interact with this medication? Do not take this medication with any of the following: Acitretin  This medication may also interact with the following: Aspirin or aspirin-like medications including salicylates Azathioprine Certain antibiotics like chloramphenicol, penicillin, tetracycline Certain medications that treat or prevent blood clots like warfarin, apixaban, dabigatran, and rivaroxaban Certain medications for stomach problems like esomeprazole,  omeprazole, pantoprazole Cyclosporine Dapsone Diuretics Folic acid Gold Hydroxychloroquine Live virus vaccines Medications for infection like acyclovir, adefovir, amphotericin B, bacitracin, cidofovir, foscarnet, ganciclovir, gentamicin, pentamidine, vancomycin Mercaptopurine NSAIDs, medications for pain and inflammation, like ibuprofen or naproxen Pamidronate Pemetrexed Penicillamine Phenylbutazone Phenytoin Probenecid Pyrimethamine Retinoids such as isotretinoin and tretinoin Steroid medications like prednisone or cortisone Sulfonamides like sulfasalazine and trimethoprim/sulfamethoxazole Theophylline Zoledronic acid This list may not describe all possible interactions. Give your health care provider a list of all the medicines, herbs, non-prescription drugs, or dietary supplements you use. Also tell them if you smoke, drink alcohol, or use illegal drugs. Some items may interact with your medicine. What should I watch for while using this medication? This medication may make you feel generally unwell. This is not uncommon as chemotherapy can affect healthy cells as well as cancer cells. Report any side effects. Continue your course of treatment even though you feel ill unless your care team tells you to stop. Your condition will be monitored carefully while you are receiving this medication. Avoid alcoholic drinks. This medication can cause serious side effects. To reduce the risk, your care team may give you other medications to take before receiving this one. Be sure to follow the directions from your care team. This medication can make you more sensitive to the sun. Keep out of the sun. If you cannot avoid being in the sun, wear protective clothing and use sunscreen. Do not use sun lamps or tanning beds/booths. You may get drowsy or dizzy. Do not drive, use machinery, or do anything that needs mental alertness until you know how this medication affects you. Do not stand or sit up  quickly, especially if you are an older patient. This reduces the risk of dizzy or fainting spells. You may need blood work while you are taking this medication. Call your care team for advice if you get a fever, chills or sore throat, or other symptoms of a cold or flu. Do not treat yourself. This medication decreases your body's ability to fight infections. Try to avoid being around people who are sick. This medication may increase your risk to bruise or bleed. Call your care team if you notice any unusual bleeding. Be careful brushing or flossing your teeth or using a toothpick because you may get an infection or bleed more easily. If you have any dental work done, tell your dentist you are receiving this medication Check with your care team if you get an attack of severe diarrhea, nausea and vomiting, or if you sweat a lot. The loss of too much body fluid can make it dangerous for you to take this medication. Talk to your care team about your risk of cancer. You may be more at risk for certain types of cancers if you take this medication. Do not become pregnant while taking this medication or for 6 months after stopping it. Women should inform their care team if they wish to become pregnant or think they might be pregnant. Men should not father a child while taking this medication and for 3 months after stopping it. There is potential for serious harm to an unborn child. Talk to your care team for more information. Do not breast-feed an infant while taking this medication or  for 1 week after stopping it. This medication may make it more difficult to get pregnant or father a child. Talk to your care team if you are concerned about your fertility. What side effects may I notice from receiving this medication? Side effects that you should report to your care team as soon as possible: Allergic reactions-skin rash, itching, hives, swelling of the face, lips, tongue, or throat Blood clot-pain, swelling, or  warmth in the leg, shortness of breath, chest pain Dry cough, shortness of breath or trouble breathing Infection-fever, chills, cough, sore throat, wounds that don't heal, pain or trouble when passing urine, general feeling of discomfort or being unwell Kidney injury-decrease in the amount of urine, swelling of the ankles, hands, or feet Liver injury-right upper belly pain, loss of appetite, nausea, light-colored stool, dark yellow or brown urine, yellowing of the skin or eyes, unusual weakness or fatigue Low red blood cell count-unusual weakness or fatigue, dizziness, headache, trouble breathing Redness, blistering, peeling, or loosening of the skin, including inside the mouth Seizures Unusual bruising or bleeding Side effects that usually do not require medical attention (report to your care team if they continue or are bothersome): Diarrhea Dizziness Hair loss Nausea Pain, redness, or swelling with sores inside the mouth or throat Vomiting This list may not describe all possible side effects. Call your doctor for medical advice about side effects. You may report side effects to FDA at 1-800-FDA-1088. Where should I keep my medication? This medication is given in a hospital or clinic. It will not be stored at home. NOTE: This sheet is a summary. It may not cover all possible information. If you have questions about this medicine, talk to your doctor, pharmacist, or health care provider.  2022 Elsevier/Gold Standard (2020-12-27 12:51:29)    To help prevent nausea and vomiting after your treatment, we encourage you to take your nausea medication as directed.  BELOW ARE SYMPTOMS THAT SHOULD BE REPORTED IMMEDIATELY: *FEVER GREATER THAN 100.4 F (38 C) OR HIGHER *CHILLS OR SWEATING *NAUSEA AND VOMITING THAT IS NOT CONTROLLED WITH YOUR NAUSEA MEDICATION *UNUSUAL SHORTNESS OF BREATH *UNUSUAL BRUISING OR BLEEDING *URINARY PROBLEMS (pain or burning when urinating, or frequent  urination) *BOWEL PROBLEMS (unusual diarrhea, constipation, pain near the anus) TENDERNESS IN MOUTH AND THROAT WITH OR WITHOUT PRESENCE OF ULCERS (sore throat, sores in mouth, or a toothache) UNUSUAL RASH, SWELLING OR PAIN  UNUSUAL VAGINAL DISCHARGE OR ITCHING   Items with * indicate a potential emergency and should be followed up as soon as possible or go to the Emergency Department if any problems should occur.  Please show the CHEMOTHERAPY ALERT CARD or IMMUNOTHERAPY ALERT CARD at check-in to the Emergency Department and triage nurse.  Should you have questions after your visit or need to cancel or reschedule your appointment, please contact [facility]  [phone] and follow the prompts.  Office hours are 8:00 a.m. to 4:30 p.m. Monday - Friday. Please note that voicemails left after 4:00 p.m. may not be returned until the following business day.  We are closed weekends and major holidays. You have access to a nurse at all times for urgent questions. Please call the main number to the clinic [phone] and follow the prompts.  For any non-urgent questions, you may also contact your provider using MyChart. We now offer e-Visits for anyone 58 and older to request care online for non-urgent symptoms. For details visit [email or web address].   Also download the MyChart app! Go to the app  store, search "MyChart", open the app, select [facility], and log in with your MyChart username and password.  Due to Covid, a mask is required upon entering the hospital/clinic. If you do not have a mask, one will be given to you upon arrival. For doctor visits, patients may have 1 support person aged 77 or older with them. For treatment visits, patients cannot have anyone with them due to current Covid guidelines and our immunocompromised population.

## 2021-10-05 NOTE — Progress Notes (Signed)
Nutrition Follow-up:  Patient with metastatic squamous cell carcinoma, p 16+.  S/p PEG placement on 09/24/2020, tube replaced on 09/16/2021.  Patient receiving methotrexate.  Notes reviewed from recent hospital admission at [facility] and [facility].  Patient was discharged from [facility] last week.    Met with patient during infusion.  Says that he is doing ok.  Has adapted feeding tube to increase length so he can feed hisself.  Unable to use right hand for feeding.  Says that he is receiving PT/OT and home health nursing currently.  Patient giving 5 cartons of [employer] 1.4 daily via feeding tube.  Flushing with 80m before and 1257mafter each feeding. Says that he is tolerating it well.  Denies any problems with feeding.     Medications: reviewed  Labs: Na 133, glucose 155  Anthropometrics:   Weight 181 lb 12.8 oz today  200 lb 11.2 oz on 6/22 (reduced feeding)  188 lb on 3/31 182 lb on 1/18   NUTRITION DIAGNOSIS: Inadequate oral intake continues but relying on feeding tube   INTERVENTION:  Patient to continue [employer], 5 cartons per day via feeding tube.  Flush with 60 ml of water before and 120 ml after.  Give additional 36050mluid (pedialyte/water) to better meet hydration needs.  Patient with low sodium.  RD asked by nursing to help give tube feeding.  Patient not feeling well and wanted to give some tube feeding but asking for assistance.  Nausea medication that was given caused patient to be lethargic.  RD assisted patient with tube feeding.     MONITORING, EVALUATION, GOAL: weight trends, tube feeding   NEXT VISIT: to be determined with treatment  Chrystel Barefield B. AllZenia ResidesD,YarnellDNHarrimangistered Dietitian [phone])

## 2021-10-05 NOTE — Progress Notes (Signed)
Hematology/Oncology  Follow Up note [facility] [phone] [phone]   Patient Care Team: Albina Billet, MD as PCP - General (Internal Medicine) Albina Billet, MD (Internal Medicine) Bary Castilla, Forest Gleason, MD (General Surgery) Noreene Filbert, MD as Referring Physician (Radiation Oncology) Earlie Server, MD as Consulting Physician (Oncology)  CHIEF COMPLAINTS/PURPOSE OF CONSULTATION:  Follow up for head and neck cancer.  HISTORY OF PRESENTING ILLNESS:  Ivan Garcia. is a  65 y.o.  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 P16 positive. cT2 cN2 disease, stage II, # 12/13/2017: PET whole body: Primary hypermetabolic mucosal lesion in the left oropharynx/tongue base with right-sided bulky multilevel lymphadenopathy. #12/31/2017. Concurrent ChemoRT [Cisplatin 100 mg/m2 q3weeks]  S/p one dose of Cisplatin. Cisplatin discontinued due to nephrotoxicity/AKI . switch to weekly Carboplatin (AUC 2) / Taxol 25m/m2 [ finished May 2019] # 03/08/2018: PET image skull base to midthigh restaging: Near complete resolution of prior large right neck mass. Small right cervical/supraclavicular nodal metastases measuring up to 10 mm short axis. No evidence of distant metastases.  # 07/02/2018 PET No evidence of residual carcinoma in the neck. No evidence of nodal metastasis. No evidence distant metastatic disease  # July 2020, he started to have hoarseness after singing for 2 to 3 hours. He has changed his insurance and Dr. MTami Ribasis no longer covered by his current insurance.  08/04/2019 establish care [place]ar [place] . Flexible laryngoscopy showed Vocal cord paralysis.  Patient was recommended to have a PET scan done for restaging. 08/20/2019 PET scan showed asymmetric hypermetabolic  involving the  left vocal cords with hypermetabolic adenopathy in the neck, and the chest.  He also has hypermetabolic pulmonary nodule along the minor fissure which is felt to be metastatic.  # 09/08/2019   ultrasound-guided right supraclavicular lymph node biopsy Pathology was positive for metastatic squamous cell carcinoma. # 09/08/2019 Omniseq NGS showed PD-L1 CPS 20, TMB indeterminant, MSI stable,PIK3CA [id number] # 09/29/2019- 01/2020  6 cycles of carboplatin, 5-FU and Keytruda, finished in February 2021.  #12/18/2019, PET/CT: marked improvement in met disease, continued on Keytruda maintenance. # 04/26/2020 PET worsening of disease in the chest.  Interval enlargement of the right thoracic inlet and paratracheal lymph nodes that were previously enlarged with new activity in the right hilum corresponding to a lymph node in this location.  Also increased activity in the right upper lobe nodule. Right level 3 lymph node in the neck and anterior mediastinal nodal activity is diminished.  These areas are quite small on prior study. Diminished symmetric uptake within the left as compared to the right vocal cord. # [facility].  Patient was offered for clinical trial with pembrolizumab/lenvatinib versus standard therapy.  05/14/2021 Patient received additional 1 cycle of Keytruda while waiting to be enrolled to the trial.   #06/24/2020, CT neck [facility] right upper paratracheal node is concerning for metastatic disease and a new from 01/20/2020 PET/CT.  The node closely abuts and possibly invade the posterolateral right tracheal wall as well as the esophagus.  Right vocal cord paralysis.  Symmetric soft tissue density at the right base of tongue could reflect residual/recurrent primary malignancy versus posttreatment changes.  Right supraclavicular lymph node is no longer appreciated  with post treatment changes.  There is  increased size of right minor fissure nodule which was metabolically avid on previous PET scan.  New mediastinal and hilar lymphadenopathy concerning for new nodal disease.  No evidence of metastatic disease below the diaphragm.  Nonobstructing 3 mm distal ureter stone.- Disease progression.  Not eligible for the clinical trial at [facility]  # 07/07/2020 09/02/2020 palliative chemotherapy with 3 cycles of docetaxel  with G-CSF support.   # 09/02/2020,chest without contrast with 3D MIPS protocol showed Interval increase in the size of the right upper lobe perifissural nodule 1.6 x 1.5 cm-previously 1.4 x 1.2 cm tracheoesophageal soft tissue mass 3.4 x 5.0 cm with regional mass-effect on the esophagus-previously 2.4 x 3.6 cm, and mediastinal lymphadenopathy-right upper paratracheal lymph node 1.9 cm-previously 1.5 cm, suspicious for progressive metastatic disease.Subcentimeter lung nodules are stable. CT neck with contrast showed slight interval increase in size of upper right paratracheal nodal conglomerate.  Questionable soft tissue invasion of the posterior tracheal appears worsened.  Compression with potential invasion of the esophagus is again noted.  Prior vocal cord paralysis again noted.  Unchanged right tongue base asymmetry. I had a discussion with patient's [facility] oncologist Dr. Barrington Ellison over the phone. Dr.Choe recommended adding carboplatin AUC 5 to docetaxel regimen for now which hopefully to help to reduce the size of the tracheoesophageal soft tissue mass.  Also recommend palliative radiation. # 09/07/2020, carboplatin AUC 5+ docetaxel # 10/21/2021S/p PEG tube placement   #09/28/2020- 10/25/20 patient finished palliative radiation to esophageal mass. #Patient establish care with [facility] Dr. Maxie Better   09/29/2020  guardant 360 which showed PIK3CA E545K, PIK3CA amplification. MSI stable, APC E2172K VUS , APC L2138L, FGFR2 R573 VUS   . # 11/09/2020, patient had a PET scan which showed partial response Right  thoracic inlet Hypermetabolic soft tissue metastasis, 3.9 x 2.6cm, decreased in size, decreased size of  Hypermetabolic right paratracheal 1.2cm, and right hilar nodal Metastases since outside 09/02/2020 neck CT Right upper lobe lung nodule 1.2cm, stable size.  RadOnc Dr. Baruch Gouty recommend additional radiation.  #11/23/20-12/17/19 patient finished additional radiation # 12/22/2020,Piqray 364m daily  discontinued on 03/31/2021 04/01/2021 CT Neck/Chest abdomen pelvis  Progressive enlargement of the right larynx and supraglottic tissues with low-density most compatible with edema from radiation. Correlate with direct visualization mucosa to evaluate for tumor recurrence in this area Right paraesophageal lymph node mass is ill-defined and unchanged in size. Possible invasion of the esophagus.  # [facility] 1 on 05/[facility]/2021.  Unfortunately patient developed grade 4 infusion related reaction, syncope while on toilet which progressed to cardiopulmonary arrest requiring CPR for 30 to 60 seconds until ROSC, epinephrine, sent to emergency room and he was admitted for 24 hours.  He has sinus tachycardia and had a TEE done which normal LVEF.  [facility]  Patient was seen by Dr.Lycan on 05/13/2021, was recommended to allow cetuximab washout and also recently being informed that he is not eligible for another clinical trial at this point. Patient was also informed that he was not eligible for another clinical try at this point.  #[facility] W[facility]iel [facility]ans[facility]  Several options were discussed. First-in-human Study of DRP-104 (Sirpiglenastat) as Single Agent and in Combination With [employer] Alternative approaches was desensitization to cetuximab in a controlled intensity monitor for any environment.  Keytruda and lenvatinib, based current promising OR ranging from 28  to 36% and DFS 4-8  months. [Journal of Clinical Oncology 36, no. 15_suppl (Apr 22, 2017) 6016-6016], [employer] 2021 Apr ;84(4):361-367]. Patient is interested in proceeding with Keytruda and lenvatinib locally at [facility].  /29/2022 Patient was started on Keytruda and lenvatinib. 08/08/2021 - 08/14/2021, patient was admitted to [facility] due to multilobar aspiration pneumonia.  Treated with antibiotics.  He developed acute on chronic weakness of right upper extremity.  MRI of the brain showed acute/subacute ischemic infarct of the right frontal lobe. 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  Not able to distinguish the etiology as opposed to radiation effect versus secondary to infiltrative malignancy, or medication induced side effects..  Patient was seen by neurology and was felt that EMG/nerve conduction study can be useful in these cases as a finding of myokymia would support a diagnosis of post radiation effect.   In order to preserve the function of his dominant right hand as much as possible and potentially even reverse some of his weakness, accurately determine the etiology of the brachial plexitis is critical.  It was felt that patient needs to be transferred to higher level of care where EMG/nerve conduction study could be done inpatient.  [facility] has no bed availability patient was transferred to [facility] 08/15/2021, EMG at [facility] marked abnormality, electrodiagnostic evidence of acute right upper trunk brachial plexopathy.  There is no myotonic discharges to suggest radiation-induced plexopathy.  It was felt that this is likely a combination of radiation/chemotherapy/checkpoint inhibitor side effects.  Given the inflammation on imaging, patient was started on steroid tapering course.  [facility]  Patient's current Is currently on on 5 mg of prednisone daily to complete steroid on 09/18/2021.  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 CHF, LVEF 40-45%, he was started on metoprolol,empagliflozin   -08/08/2021, CT chest PE protocol showed bilateral aspiration pneumonia.  Progression of metastatic disease in the right lung with enlargement of superior right upper lobe and inferior right upper lobe nodule.  Similar appearance of the retrotracheal tumor at the level of the thoracic inlet.  Increased prominence of left axillary lymph node -06/16/2021, patient had a PET scan at [facility] are available in [place]. I spoke with radiology Dr. Nelia Shi.  Comparing to his PET scan in July 2022, patient has had a disease progression in his lungs nodules.   There was no uptake activities in his right brachial plexus.  Lenvatinib was held during his admission.  INTERVAL HISTORY Ivan Garcia. is a 65 y.o. male who presents for follow-up for metastatic squamous cell carcinoma, P 16+.    09/26/2021, patient was seen by [facility] oncology Dr. Mariel Kansky for discussion of his options.  1 option is to do desensitization cetuximab and the other option is to try methotrexate.  Patient hoped to do methotrexate weekly and prefers the treatment to be done locally with [facility].  Patient has been discharged from rehab and lives at home now.  Presents for evaluation prior to chemotherapy.  Patient does feeding tubes.  He participated in home physical therapy treatments.  Denies any nausea vomiting diarrhea, shortness of breath cough.  Right upper extremity weakness, slightly improving.  Patient is on prednisone for radiation brachial plexitis and follows up with neurology Dr. Mickeal Skinner.   Review of Systems  Constitutional:  Negative for chills, fever, malaise/fatigue and weight loss.  HENT:  Negative for sore throat.  Dysphagia   Eyes:  Negative for redness.  Respiratory:  Negative for cough, shortness of breath and wheezing.   Cardiovascular:  Negative for chest pain, palpitations and leg swelling.  Gastrointestinal:  Negative for  abdominal pain, blood in stool, nausea and vomiting.  Genitourinary:  Negative for dysuria.  Musculoskeletal:  Negative for myalgias.       Right upper extremity weakness  Skin:  Negative for rash.  Neurological:  Negative for dizziness, tingling and tremors.  Endo/Heme/Allergies:  Does not bruise/bleed easily.  Psychiatric/Behavioral:  Negative for hallucinations.    MEDICAL HISTORY:  Past Medical History:  Diagnosis Date   Arthritis    Cancer ([place])    Head and neck cancer   Hemorrhoids    Hyperlipidemia    Hypertension     SURGICAL HISTORY: Past Surgical History:  Procedure Laterality Date   COLONOSCOPY WITH PROPOFOL N/A 04/04/2017   Procedure: COLONOSCOPY WITH PROPOFOL;  Surgeon: Robert Bellow, MD;  Location: [facility] ENDOSCOPY;  Service: Endoscopy;  Laterality: N/A;   IR GASTROSTOMY TUBE MOD SED  09/24/2020   IR REPLACE G-TUBE SIMPLE WO FLUORO  09/16/2021   MANDIBLE SURGERY     PORT-A-CATH REMOVAL  07/17/2018   PORTA CATH INSERTION N/A 12/19/2017   Procedure: PORTA CATH INSERTION;  Surgeon: Algernon Huxley, MD;  Location: [place] CV LAB;  Service: Cardiovascular;  Laterality: N/A;   PORTA CATH INSERTION N/A 07/17/2018   Procedure: PORTA CATH INSERTION;  Surgeon: Algernon Huxley, MD;  Location: [place] CV LAB;  Service: Cardiovascular;  Laterality: N/A;   PORTA CATH INSERTION N/A 09/18/2019   Procedure: PORTA CATH INSERTION;  Surgeon: Algernon Huxley, MD;  Location: [place] CV LAB;  Service: Cardiovascular;  Laterality: N/A;   TONSILLECTOMY      SOCIAL HISTORY: Social History   Socioeconomic History   Marital status: Single    Spouse name: Not on file   Number of children: Not on file   Years of education: Not on file   Highest education level: Not on file  Occupational History   Not on file  Tobacco Use   Smoking status: Never   Smokeless tobacco: Never  Vaping Use   Vaping Use: Never used  Substance and Sexual Activity   Alcohol use: No   Drug use: No    Sexual activity: Not on file  Other Topics Concern   Not on file  Social History Narrative   Not on file   Social Determinants of Health   Financial Resource Strain: Not on file  Food Insecurity: Not on file  Transportation Needs: Not on file  Physical Activity: Not on file  Stress: Not on file  Social Connections: Not on file  Intimate Partner Violence: Not on file    FAMILY HISTORY: Family History  Problem Relation Age of Onset   Breast cancer Mother    Asthma Mother    Congestive Heart Failure Mother    Prostate cancer Father    Brain cancer Father    Bladder Cancer Father     ALLERGIES:  is allergic to cetuximab.  MEDICATIONS:  Current Outpatient Medications  Medication Sig Dispense Refill   acetaminophen (TYLENOL) 160 MG/5ML solution Take by mouth.     amLODipine (NORVASC) 5 MG tablet Take 5 mg by mouth daily.     aspirin 81 MG chewable tablet Place 1 tablet (81 mg total) into feeding tube daily. 30 tablet 0   atorvastatin (LIPITOR) 40 MG tablet Place 1 tablet (40  mg total) into feeding tube daily. 30 tablet 0   cetirizine (ZYRTEC) 10 MG tablet Take by mouth.     famotidine (PEPCID) 20 MG tablet Take by mouth.     folic acid (FOLVITE) 1 MG tablet Take 1 tablet (1 mg total) by mouth daily. 90 tablet 1   gabapentin (NEURONTIN) 100 MG capsule TAKE 2 CAPSULES BY MOUTH 3 TIMES DAILY.     HYDROcodone-acetaminophen (NORCO) 10-325 MG tablet Take 1 tablet by mouth every 6 (six) hours as needed. 60 tablet 0   levothyroxine (SYNTHROID) 50 MCG tablet Place 1 tablet (50 mcg total) into feeding tube daily at 6 (six) AM. 30 tablet 3   lidocaine-prilocaine (EMLA) cream Apply to affected area once 30 g 3   metoprolol succinate (TOPROL-XL) 25 MG 24 hr tablet Take 1 tablet (25 mg total) by mouth daily. 30 tablet 0   Nutritional Supplements (FEEDING SUPPLEMENT, KATE FARMS STANDARD 1.4,) LIQD liquid Place 325 mLs into feeding tube 5 (five) times daily. 1000 mL 0   pantoprazole sodium  (PROTONIX) 40 mg PACK Place 20 mLs (40 mg total) into feeding tube daily. 30 packet 0   predniSONE (DELTASONE) 20 MG tablet Take 1 tablet (20 mg total) by mouth daily with breakfast. 60 tablet 3   pregabalin (LYRICA) 150 MG capsule Take 1 capsule (150 mg total) by mouth 2 (two) times daily. 60 capsule 3   prochlorperazine (COMPAZINE) 10 MG tablet Take 1 tablet (10 mg total) by mouth every 6 (six) hours as needed (Nausea or vomiting). 30 tablet 1   ALPRAZolam (XANAX) 0.25 MG tablet Place 1 tablet (0.25 mg total) into feeding tube at bedtime as needed for anxiety. (Patient not taking: Reported on 10/05/2021) 30 tablet 0   diphenoxylate-atropine (LOMOTIL) 2.5-0.025 MG/5ML liquid Take 5 mLs by mouth 4 (four) times daily as needed for diarrhea or loose stools. (Patient not taking: No sig reported) 60 mL 0   enoxaparin (LOVENOX) 40 MG/0.4ML injection Inject 0.4 mLs (40 mg total) into the skin daily. (Patient not taking: No sig reported) 1 mL 0   insulin aspart (NOVOLOG) 100 UNIT/ML injection Inject 0-9 Units into the skin every 4 (four) hours. (Patient not taking: No sig reported) 10 mL 11   labetalol (NORMODYNE) 5 MG/ML injection Inject 2 mLs (10 mg total) into the vein every 3 (three) hours as needed (SBP more than 160 or DBP more than 100). (Patient not taking: No sig reported) 20 mL 0   morphine 2 MG/ML injection Inject 1 mL (2 mg total) into the vein every 4 (four) hours as needed. (Patient not taking: No sig reported) 1 mL 0   nystatin (MYCOSTATIN) 100000 UNIT/ML suspension Use as directed 5 mLs (500,000 Units total) in the mouth or throat in the morning, at noon, and at bedtime. SWISH AND SPIT 473 mL 1   ondansetron (ZOFRAN) 8 MG tablet Take 1 tablet (8 mg total) by mouth 2 (two) times daily as needed for refractory nausea / vomiting. Start on day 3 after carboplatin chemo. 30 tablet 1   oxyCODONE (ROXICODONE) 5 MG/5ML solution PLACE 5-10 MLS (5-10 MG TOTAL) INTO FEEDING TUBE EVERY 4 (FOUR) HOURS AS  NEEDED FOR SEVERE PAIN. (Patient not taking: No sig reported)     senna-docusate (SENOKOT-S) 8.6-50 MG tablet Take 2 tablets by mouth daily. (Patient not taking: Reported on 10/05/2021) 60 tablet 3   sodium chloride 0.9 % infusion Inject 1,000 mLs into the vein as needed (for administration of IV medications (carrier  fluid)). (Patient not taking: No sig reported) 1000 mL 0   traMADol (ULTRAM) 50 MG tablet Place 1 tablet (50 mg total) into feeding tube every 6 (six) hours as needed for moderate pain. (Patient not taking: Reported on 10/05/2021) 30 tablet 0   Water For Irrigation, Sterile (FREE WATER) SOLN Place 120 mLs into feeding tube 5 (five) times daily. (Patient not taking: Reported on 10/05/2021) 300 mL 0   No current facility-administered medications for this visit.   Facility-Administered Medications Ordered in Other Visits  Medication Dose Route Frequency Provider Last Rate Last Admin   0.9 %  sodium chloride infusion   Intravenous Continuous Earlie Server, MD 20 mL/hr at 10/05/21 0921 New Bag at 10/05/21 0921   heparin lock flush 100 UNIT/ML injection            heparin lock flush 100 unit/mL  500 Units Intravenous Once Earlie Server, MD       sodium chloride flush (NS) 0.9 % injection 10 mL  10 mL Intravenous PRN Earlie Server, MD       sodium chloride flush (NS) 0.9 % injection 10 mL  10 mL Intravenous PRN Earlie Server, MD   10 mL at 10/13/20 1034   sodium chloride flush (NS) 0.9 % injection 10 mL  10 mL Intravenous PRN Earlie Server, MD   10 mL at 10/05/21 0817     PHYSICAL EXAMINATION: ECOG PERFORMANCE STATUS: 1 - Symptomatic but completely ambulatory Vitals:   10/05/21 0832  BP: 106/70  Pulse: (!) 103  Temp: 97.7 F (36.5 C)   Physical Exam Constitutional:      General: He is not in acute distress.    Appearance: He is not diaphoretic.  HENT:     Head: Normocephalic and atraumatic.     Nose: Nose normal.     Mouth/Throat:     Pharynx: No oropharyngeal exudate.     Comments: thrush Eyes:      General: No scleral icterus.    Pupils: Pupils are equal, round, and reactive to light.  Neck:     Comments: Right cervical lymphadenopathy Cardiovascular:     Rate and Rhythm: Normal rate and regular rhythm.     Heart sounds: No murmur heard. Pulmonary:     Effort: Pulmonary effort is normal. No respiratory distress.     Breath sounds: No rales.  Chest:     Chest wall: No tenderness.  Abdominal:     General: There is no distension.     Palpations: Abdomen is soft.     Tenderness: There is no abdominal tenderness.     Comments: +PEG tube  Musculoskeletal:        General: Normal range of motion.     Cervical back: Normal range of motion and neck supple.     Comments:  Decreased right upper extremity strength,3/5, improved.    Lymphadenopathy:     Cervical: Cervical adenopathy present.  Skin:    General: Skin is warm and dry.     Findings: No erythema.  Neurological:     Mental Status: He is alert and oriented to person, place, and time.     Cranial Nerves: No cranial nerve deficit.     Motor: No abnormal muscle tone.     Coordination: Coordination normal.  Psychiatric:        Mood and Affect: Affect normal.     LABORATORY DATA:  I have reviewed the data as listed Lab Results  Component Value Date   WBC  11.2 (H) 10/05/2021   HGB 10.9 (L) 10/05/2021   HCT 34.7 (L) 10/05/2021   MCV 96.9 10/05/2021   PLT 424 (H) 10/05/2021   Recent Labs    08/08/21 0919 08/08/21 1335 08/13/21 0630 09/14/21 1332 10/05/21 0817  NA 132*   < > 129* 129* 133*  K 4.8   < > 3.9 4.7 4.2  CL 96*   < > 95* 89* 94*  CO2 28   < > 29 31 32  GLUCOSE 141*   < > 114* 128* 155*  BUN 35*   < > 18 25* 23  CREATININE 1.87*   < > 0.75 0.94 0.82  CALCIUM 9.1   < > 8.4* 8.2* 8.7*  GFRNONAA 39*   < > >60 >60 >60  PROT 7.3  --   --  6.2* 5.8*  ALBUMIN 4.0  --   --  3.1* 3.0*  AST 48*  --   --  16 20  ALT 24  --   --  27 21  ALKPHOS 76  --   --  64 58  BILITOT 0.9  --   --  0.3 0.6   < > =  values in this interval not displayed.   RADIOGRAPHIC STUDIES: I have personally reviewed the radiological images as listed and agreed with the findings in the report. CT ANGIO HEAD NECK W WO CM  Result Date: 08/12/2021 CLINICAL DATA:  Follow-up examination for acute stroke, possible dural sinus thrombosis. EXAM: CT ANGIOGRAPHY HEAD AND NECK CT VENOGRAM HEAD TECHNIQUE: Multidetector CT imaging of the head and neck was performed using the standard protocol during bolus administration of intravenous contrast. Multiplanar CT image reconstructions and MIPs were obtained to evaluate the vascular anatomy. Carotid stenosis measurements (when applicable) are obtained utilizing NASCET criteria, using the distal internal carotid diameter as the denominator. Dedicated CT imaging of the dural venous sinuses using the standard protocol following the administration of IV contrast. CONTRAST:  3m OMNIPAQUE IOHEXOL 350 MG/ML SOLN COMPARISON:  Comparison made with previous MRI from 08/10/2021. FINDINGS: CT HEAD FINDINGS Brain: Cerebral volume within normal limits. Previously identified punctate right frontal infarcts not visible by CT. No other acute large vessel territory infarct. No intracranial hemorrhage. No mass lesion, mass effect or midline shift. No hydrocephalus or extra-axial fluid collection. Vascular: No hyperdense vessel. Skull: Scalp soft tissues and calvarium within normal limits. Sinuses: Scattered mucosal thickening within the ethmoidal air cells and maxillary sinuses. Trace chronic right mastoid effusion. Orbits: Globes and orbital soft tissues within normal limits. Review of the MIP images confirms the above findings CTA NECK FINDINGS Aortic arch: Visualized aortic arch normal in caliber with normal 3 vessel morphology. Minor for age atheromatous change along the undersurface of the aortic arch. No stenosis about the origin of the great vessels. Right carotid system: Right CCA widely patent without stenosis.  Ill-defined soft tissue density partially surrounding the proximal and mid right CCA consistent with post treatment changes. Mild for age atheromatous change about the right carotid bulb and proximal right ICA without significant stenosis. Right ICA widely patent distally without stenosis dissection or occlusion. Left carotid system: Left CCA patent from its origin to the bifurcation without stenosis. Mild for age eccentric mixed plaque at the proximal left ICA without significant stenosis. Left ICA patent distally without stenosis, dissection or occlusion. Vertebral arteries: Both vertebral arteries arise from the subclavian arteries. No proximal subclavian artery stenosis. Similarly, ill-defined soft tissue partially surrounding the proximal right vertebral artery consistent with  post treatment changes. Vertebral arteries widely patent without stenosis, dissection or occlusion. Skeleton: No visible acute osseous finding. No discrete or worrisome osseous lesions. Torus mandibularis noted. Other neck: Post treatment changes seen within the mid and lower right neck. Mucosal edema within the right greater than left glottic and supraglottic larynx likely reflects post radiation changes. Layering secretions noted within the hypopharynx. Scattered soft tissue stranding within the right anterolateral neck and retropharyngeal space also consistent with post radiation changes. Patient's known ill-defined nodal conglomerate just to the right of the esophagus at the thoracic inlet again seen, somewhat difficult to measure, but grossly similar from previous. Possible invasion of the adjacent esophagus which demonstrates irregular ill-defined wall thickening again noted. Changes are relatively similar to prior neck CT from 04/01/2021. Upper chest: Right greater than left layering pleural effusions partially visualized. Associated atelectasis and/or consolidation at the partially visualized right posterior lung. 1.7 cm nodule  present at the posterior right upper lobe, measuring slightly larger as compared to recent chest CT from 08/08/2021. Underlying emphysematous changes noted. 1.6 cm precarinal lymph node noted, indeterminate (series 9, image 1). Review of the MIP images confirms the above findings CTA HEAD FINDINGS Anterior circulation: Both internal carotid arteries widely patent to the termini without stenosis. 6 mm focal outpouching arising from the cavernous left ICA consistent with aneurysm (series 11, image 252). This projects laterally. A1 segments patent bilaterally. Normal anterior communicating artery complex. Anterior cerebral arteries patent to their distal aspects without stenosis. No M1 stenosis or occlusion. Normal MCA bifurcations. Distal MCA branches well perfused and symmetric. Posterior circulation: Both V4 segments patent to the vertebrobasilar junction without stenosis. Both PICA origins patent and normal. Basilar widely patent to its distal aspect without stenosis. Superior cerebellar arteries patent bilaterally. Both PCAs primarily supplied via the basilar and are well perfused to there distal aspects. Venous sinuses: Additional delayed venous phase images were performed. Normal enhancement seen throughout the superior sagittal sinus to the level of the torcula. Left transverse and sigmoid sinus is patent as is the visualized left internal jugular vein. Dominant right transverse and sigmoid sinuses demonstrate heterogeneous early enhancement, but are seen to fill and opacify with additional delayed images, consistent with slow but patent flow. Straight sinus, vein of Galen, internal cerebral veins, and basal veins of Rosenthal are patent. Cavernous sinus appears patent without abnormality. Superior orbital veins grossly symmetric and normal. No appreciable cortical vein thrombosis. Anatomic variants: None significant. Review of the MIP images confirms the above findings IMPRESSION: CT HEAD IMPRESSION: Stable  head CT. 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 No other acute intracranial abnormality. CTA HEAD AND NECK IMPRESSION: 1. Negative CTA for large vessel occlusion. Mild atheromatous disease for age, with no hemodynamically significant or correctable stenosis. 2. 6 mm cavernous left ICA aneurysm. 3. Post treatment changes within the lower neck with similar size and appearance of ill-defined right paraesophageal nodal mass. Possible invasion of the adjacent esophagus again noted. 4. Right greater than left layering pleural effusions with associated atelectasis and/or consolidation. 5. 1.7 cm right upper lobe nodule, consistent with metastatic disease. This measures slightly larger in size as compared to recent chest CT from 08/08/2021. 6. 1.6 cm precarinal lymph node, indeterminate. Attention at follow-up recommended. CT VENOGRAM IMPRESSION: 1. No evidence for dural venous sinus thrombosis. 2. Evidence for patent but slow flow within the dominant right transverse and sigmoid sinuses, corresponding with the asymmetric FLAIR signal intensity seen on prior MRI. Electronically Signed  By: Jeannine Boga M.D.   On: 08/12/2021 02:12   DG Wrist 2 Views Right  Result Date: 08/10/2021 CLINICAL DATA:  Hand and wrist soreness and swelling EXAM: RIGHT WRIST - 2 VIEW; RIGHT HAND - 2 VIEW COMPARISON:  None. FINDINGS: Wrist: There is no acute fracture or dislocation. There is minimal positive ulnar variance. Alignment is otherwise normal. The joint spaces are preserved. The soft tissues are unremarkable. Hand: There is no acute fracture or dislocation. Alignment is normal. There is degenerative change at the thumb MCP joint. The joint spaces are otherwise preserved. The soft tissues are unremarkable. IMPRESSION: Mild degenerative change at the thumb MCP joint. Otherwise, unremarkable hand and wrist radiographs. Electronically Signed   By: Valetta Mole M.D.   On: 08/10/2021 13:33    CT HEAD WO CONTRAST (5MM)  Result Date: 08/08/2021 CLINICAL DATA:  Found on floor by family. Personal history of esophageal cancer. Feeding tube. Mental status change. Hypoxia. EXAM: CT HEAD WITHOUT CONTRAST CT CERVICAL SPINE WITHOUT CONTRAST TECHNIQUE: Multidetector CT imaging of the head and cervical spine was performed following the standard protocol without intravenous contrast. Multiplanar CT image reconstructions of the cervical spine were also generated. COMPARISON:  MRI had without and with contrast 04/10/2021 FINDINGS: CT HEAD FINDINGS Brain: No acute infarct, hemorrhage, or mass lesion is present. No significant white matter lesions are present. The ventricles are of normal size. No significant extraaxial fluid collection is present. The brainstem and cerebellum are within normal limits. Vascular: No hyperdense vessel or unexpected calcification. Skull: Right supraorbital soft tissue swelling and hematoma is present. No underlying fracture is present. Calvarium otherwise within normal limits. No other significant extracranial soft tissue injury is present. Sinuses/Orbits: Mild mucosal thickening is present in the maxillary sinuses bilaterally. No air-fluid levels are present. The globes and orbits are within normal limits. CT CERVICAL SPINE FINDINGS Alignment: No significant listhesis is present. Cervical lordosis preserved. Skull base and vertebrae: Craniocervical junction is within normal limits. Vertebral body heights are normal. Narrow acute or healing fractures are present. Soft tissues and spinal canal: No prevertebral fluid or swelling. No visible canal hematoma. Esophageal mass the thoracic inlet again noted. Edema along the right vocal cord improved. Right IJ Port-A-Cath in place. Disc levels: Uncovertebral spurring leads to moderate foraminal narrowing at C3-4 and C5-6, left greater than right. Upper chest: Lung apices are clear. IMPRESSION: 1. Right supraorbital soft tissue swelling and  hematoma without underlying fracture. 2. Normal CT appearance of the brain. 3. No acute fracture or traumatic subluxation in the cervical spine. 4. Multilevel degenerative changes of the cervical spine as described. Electronically Signed   By: San Morelle M.D.   On: 08/08/2021 13:54   CT Angio Chest PE W and/or Wo Contrast  Result Date: 08/08/2021 CLINICAL DATA:  History metastatic or pharyngeal squamous carcinoma. Found on floor at home and hypoxic. Elevated white blood cell count, D-dimer and lipase. EXAM: CT ANGIOGRAPHY CHEST WITH CONTRAST TECHNIQUE: Multidetector CT imaging of the chest was performed using the standard protocol during bolus administration of intravenous contrast. Multiplanar CT image reconstructions and MIPs were obtained to evaluate the vascular anatomy. CONTRAST:  3m OMNIPAQUE IOHEXOL 350 MG/ML SOLN COMPARISON:  Prior CT of the chest on 04/01/2021 FINDINGS: Cardiovascular: The pulmonary arteries are adequately opacified. There is no evidence of pulmonary embolism. Central pulmonary arteries are normal in caliber. The thoracic aorta is normal in caliber. Stable heart size. Stable calcified coronary artery plaque. Mediastinum/Nodes: Retrotracheal tumor at the level of the thoracic  inlet and extending into the lower neck is similar to prior recent studies and abuts the esophagus. No evidence of mediastinal or hilar lymphadenopathy. Increased prominence of some adjacent superior left axillary lymph nodes with the largest measuring approximately 1 cm in short axis. Lungs/Pleura: New extensive airspace consolidation and volume loss of the right lower lobe and right middle lobe with visible occlusive debris in the bronchus intermedius and scattered small air bronchograms in consolidated lung. Visible nearly occlusive debris in the left mainstem bronchus and debris in the left lower lobe bronchus with near complete atelectasis of the left lower lobe. Focal airspace disease in the medial  aspect of the right middle lobe and posterior left upper lobe. Pulmonary findings are consistent with bilateral pneumonia and highly suggestive of aspiration pneumonia given the extensive debris in the airways. No significant associated pleural fluid or evidence of pneumothorax. Known metastatic pulmonary nodule in the superior right upper lobe shows interval enlargement since the prior study in April. This previously measured 8 mm and now measures 13 x 13 mm. Metastatic mass in the inferior aspect of the right upper lobe previously measuring 1.2 x 1.3 cm shows enlargement and now measures approximately 2.8 x 2.9 cm. Previously noted right lower lobe pulmonary nodule is not visible on today study due to the extensive consolidation and volume loss of the right lower lobe. Musculoskeletal: No chest wall abnormality. No acute or significant osseous findings. Review of the MIP images confirms the above findings. IMPRESSION: 1. No evidence of pulmonary embolism. 2. New extensive airspace consolidation and volume loss of the right lower lobe, right middle lobe and left lower lobe with significant debris in the right bronchus intermedius, left mainstem bronchus and left lower lobe bronchus. Additional airspace disease in the right middle lobe and left upper lobe. Findings are suggestive of bilateral aspiration pneumonia. 3. Progression of metastatic disease in the right lung with enlargement of superior right upper lobe and inferior right upper lobe nodules as noted above. The previously noted right lower lobe nodule cannot be assessed as it is obscured by consolidation of the right lower lobe. 4. 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 5. Increased prominence of left axillary lymph nodes which only measure 1 cm in short axis. Electronically Signed   By: Aletta Edouard M.D.   On: 08/08/2021 14:11   CT Cervical Spine Wo Contrast  Result Date: 08/08/2021 CLINICAL DATA:  Found on  floor by family. Personal history of esophageal cancer. Feeding tube. Mental status change. Hypoxia. EXAM: CT HEAD WITHOUT CONTRAST CT CERVICAL SPINE WITHOUT CONTRAST TECHNIQUE: Multidetector CT imaging of the head and cervical spine was performed following the standard protocol without intravenous contrast. Multiplanar CT image reconstructions of the cervical spine were also generated. COMPARISON:  MRI had without and with contrast 04/10/2021 FINDINGS: CT HEAD FINDINGS Brain: No acute infarct, hemorrhage, or mass lesion is present. No significant white matter lesions are present. The ventricles are of normal size. No significant extraaxial fluid collection is present. The brainstem and cerebellum are within normal limits. Vascular: No hyperdense vessel or unexpected calcification. Skull: Right supraorbital soft tissue swelling and hematoma is present. No underlying fracture is present. Calvarium otherwise within normal limits. No other significant extracranial soft tissue injury is present. Sinuses/Orbits: Mild mucosal thickening is present in the maxillary sinuses bilaterally. No air-fluid levels are present. The globes and orbits are within normal limits. CT CERVICAL SPINE FINDINGS Alignment: No significant listhesis is present. Cervical lordosis preserved.  Skull base and vertebrae: Craniocervical junction is within normal limits. Vertebral body heights are normal. Narrow acute or healing fractures are present. Soft tissues and spinal canal: No prevertebral fluid or swelling. No visible canal hematoma. Esophageal mass the thoracic inlet again noted. Edema along the right vocal cord improved. Right IJ Port-A-Cath in place. Disc levels: Uncovertebral spurring leads to moderate foraminal narrowing at C3-4 and C5-6, left greater than right. Upper chest: Lung apices are clear. IMPRESSION: 1. Right supraorbital soft tissue swelling and hematoma without underlying fracture. 2. Normal CT appearance of the brain. 3. No  acute fracture or traumatic subluxation in the cervical spine. 4. Multilevel degenerative changes of the cervical spine as described. Electronically Signed   By: San Morelle M.D.   On: 08/08/2021 13:54   MR BRAIN WO CONTRAST  Result Date: 08/11/2021 CLINICAL DATA:  Initial evaluation for neuro deficit, stroke suspected, cervical radiculopathy. EXAM: MRI HEAD WITHOUT CONTRAST TECHNIQUE: Multiplanar, multiecho pulse sequences of the brain and surrounding structures were obtained without intravenous contrast. COMPARISON:  Prior study from 08/08/2021. FINDINGS: MRI HEAD FINDINGS Brain: Cerebral volume within normal limits. No significant cerebral white matter disease for age. There are 2 subtle adjacent punctate foci of diffusion abnormality involving the subcortical aspect of the posterior right frontal region (series 5, images 36, 35), suspicious for tiny acute to early subacute ischemic infarcts. No associated hemorrhage or mass effect. No other diffusion abnormality to suggest acute or subacute ischemia. Gray-white matter differentiation otherwise maintained. No encephalomalacia to suggest chronic cortical infarction elsewhere within the brain. No other evidence for acute or chronic intracranial hemorrhage. No mass lesion, midline shift or mass effect. No hydrocephalus or extra-axial fluid collection. Pituitary gland and suprasellar region within normal limits. Midline structures intact. Vascular: There is asymmetric FLAIR signal with intermediate T1 signal intensity involving the right transverse and sigmoid sinus (series 15, image 14). While this finding is favored to reflect slow/sluggish flow, possible thrombus is difficult to exclude. Major intracranial vascular flow voids are otherwise maintained. Skull and upper cervical spine: Craniocervical junction within normal limits. Bone marrow signal intensity normal. No focal marrow replacing lesion. No scalp soft tissue abnormality. Sinuses/Orbits:  Globes and orbital soft tissues within normal limits. Mild scattered mucosal thickening noted within the ethmoidal air cells and maxillary sinuses. Paranasal sinuses are otherwise clear. Small right mastoid effusion noted, of doubtful significance. Inner ear structures grossly normal. Other: None. IMPRESSION: 1. Two subtle adjacent punctate foci of diffusion abnormality involving the subcortical aspect of the posterior right frontal region, suspicious for tiny acute to early subacute ischemic infarcts. No associated hemorrhage or mass effect. 2. Asymmetric FLAIR signal intensity involving the right transverse and sigmoid sinus. While this finding is favored to reflect slow/sluggish flow, possible thrombus is difficult to exclude. Further assessment with dedicated MRV, with and without contrast, suggested for further evaluation. 3. Otherwise normal brain MRI for age. Electronically Signed   By: Jeannine Boga M.D.   On: 08/11/2021 00:53   MR CHEST W WO CONTRAST  Result Date: 08/12/2021 CLINICAL DATA:  Progressive right upper extremity weakness. 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 EXAM: MR CHEST WITH AND WITHOUT CONTRAST TECHNIQUE: Multiplanar, multisequence MR imaging of the right brachial plexus was performed before and after the administration of intravenous contrast. CONTRAST:  7.24m GADAVIST GADOBUTROL 1 MMOL/ML IV SOLN COMPARISON:  CTA head and neck from yesterday. FINDINGS: Spinal cord Normal caliber and signal of visualized spinal cord. Brachial plexus There is diffusely increased T2 signal  and mild enhancement involving the right brachial plexus roots, trunks, divisions, cords, and branches. Muscles and tendons Incompletely evaluated mild edema involving the right rotator cuff muscles. Bones Please see separate MRI cervical spine reports from today and yesterday. Other findings Unchanged moderate right pleural effusion. Similar right paraesophageal nodal conglomerate at  the thoracic inlet. Edematous glottic and supraglottic larynx consistent with post radiation change. Similar layering secretions in the hypopharynx. Non-enhancing soft tissue thickening surrounding the right common carotid artery, also consistent with post treatment change. IMPRESSION: 1. 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 2. Similar right paraesophageal nodal conglomerate at the thoracic inlet. Similar post treatment changes in the larynx and surrounding the right common carotid artery. 3. Unchanged moderate right pleural effusion. Electronically Signed   By: Titus Dubin M.D.   On: 08/12/2021 20:15   MR CERVICAL SPINE WO CONTRAST  Result Date: 08/11/2021 CLINICAL DATA:  Initial evaluation for cervical radiculopathy. History of oropharyngeal squamous cell carcinoma. EXAM: MRI CERVICAL SPINE WITHOUT CONTRAST TECHNIQUE: Multiplanar, multisequence MR imaging of the cervical spine was performed. No intravenous contrast was administered. COMPARISON:  Prior CT of the neck from 01/05/2021. FINDINGS: Alignment: Straightening of the normal cervical lordosis. No listhesis. Vertebrae: Vertebral body height maintained without acute or chronic fracture. Suspected postradiation changes with possible fatty marrow conversion within the visualized osseous structures. Mild marrow edema involving the anterior aspects of the C4 through C7 vertebral bodies could be post treatment related as well. No discrete or worrisome osseous lesions. Facet arthrosis with associated marrow edema present about the left C3-4 facet, which could contribute to neck pain. Cord: Normal signal and morphology. Posterior Fossa, vertebral arteries, paraspinal tissues: Craniocervical junction within normal limits. Irregular appearance of the visualized hypopharynx with layering retropharyngeal effusion and right paraesophageal nodal conglomerate, grossly similar to prior neck CT. Normal flow voids seen within the vertebral  arteries bilaterally. Disc levels: C2-C3: Mild left eccentric disc bulge with left-sided uncinate spurring. Mild left-sided facet hypertrophy. No spinal stenosis. Foramina remain patent. C3-C4: Broad-based posterior disc osteophyte complex flattens and partially effaces the ventral thecal sac, asymmetric to the left. Mild left-sided spinal stenosis without cord impingement. Superimposed moderate left-sided facet degeneration. Moderate left with mild right C4 foraminal narrowing. C4-C5: Mild disc bulge with uncovertebral spurring. Mild left greater than right facet hypertrophy. No spinal stenosis. Mild bilateral C5 foraminal narrowing. C5-C6: Broad-based disc osteophyte complex flattens and partially effaces the ventral thecal sac, asymmetric to the right. Mild spinal stenosis with mild flattening of the ventral cord, but no cord signal changes. Severe right worse than left C6 foraminal narrowing. C6-C7: Minimal disc bulge with bilateral uncovertebral spurring. No spinal stenosis. Superimposed mild left-sided facet hypertrophy. Mild bilateral C7 foraminal narrowing. C7-T1: Negative interspace. Mild left-sided facet hypertrophy. No stenosis. Visualized upper thoracic spine demonstrates no significant finding. IMPRESSION: 1. Degenerative disc osteophyte at C5-6 with resultant mild spinal stenosis, with severe bilateral C6 foraminal narrowing. 2. Left eccentric disc osteophyte and facet arthrosis at C3-4 with resultant mild left-sided spinal stenosis, with moderate left C4 foraminal narrowing. 3. More mild multilevel spondylosis elsewhere within the cervical spine as above. No other significant stenosis or neural impingement. 4. Post treatment changes within the visualized neck with associated right paraesophageal nodal conglomerate, partially visualized, but grossly similar to previous neck CT from 01/05/2021. Electronically Signed   By: Jeannine Boga M.D.   On: 08/11/2021 01:10   MR CERVICAL SPINE W  CONTRAST  Result Date: 08/12/2021 CLINICAL DATA:  Head and neck cancer,  known C-spine involvement EXAM: MRI CERVICAL SPINE WITH CONTRAST TECHNIQUE: Multiplanar, multisequence MR imaging of the cervical spine was performed following the administration of intravenous contrast. COMPARISON:  08/10/2021 MRI cervical spine without contrast, 05/31/2021 MRI cervical spine with and without contrast. Correlation is also made with CTA head neck 08/11/2021 FINDINGS: Alignment: Physiologic. Vertebrae: Diffuse fatty replacement of the bone marrow secondary to prior radiation in the neck. Redemonstrated small areas of enhancement at the anterior aspect of C5, C6, C7, and T1, which appear overall similar to the prior exam. No acute fracture. Cord: No abnormal enhancement. Posterior Fossa, vertebral arteries, paraspinal tissues: Mucosal thickening and enhancement in the esophagus, with a nonenhancing fluid collection at the right aspect. Posterior fossa is unremarkable. Disc levels: Please see previous MRI of the cervical spine 08/10/2021. IMPRESSION: Redemonstrated small areas of enhancement at the anterior aspects of C5, C6, C7, and T1, which appear overall similar to the prior exam. No new foci of abnormal osseous enhancement. No evidence of cord enhancement. Electronically Signed   By: Merilyn Baba M.D.   On: 08/12/2021 17:19   CT ABDOMEN PELVIS W CONTRAST  Result Date: 08/08/2021 CLINICAL DATA:  History of metastatic oropharyngeal squamous carcinoma. Found on floor at home and hypoxic. Elevated white blood cell count, D-dimer and lipase. EXAM: CT ABDOMEN AND PELVIS WITH CONTRAST TECHNIQUE: Multidetector CT imaging of the abdomen and pelvis was performed using the standard protocol following bolus administration of intravenous contrast. CONTRAST:  20m OMNIPAQUE IOHEXOL 350 MG/ML SOLN COMPARISON:  CT of the abdomen without contrast on 09/15/2020 and PET scan on 11/09/2020 FINDINGS: Hepatobiliary: Stable left lobe hepatic  cyst. Gallbladder and bile ducts are unremarkable. Pancreas: Unremarkable. No pancreatic ductal dilatation or surrounding inflammatory changes. Spleen: Normal in size without focal abnormality. Adrenals/Urinary Tract: Stable right renal cyst. No hydronephrosis or calculi. No solid renal masses or adrenal masses. The bladder is unremarkable. Stomach/Bowel: Gastrostomy tube present with normal positioning within the lumen of the stomach. No evidence of bowel obstruction, ileus or inflammation. No mass lesions are seen involving the bowel. Diverticulosis of the descending and sigmoid colon without evidence of diverticulitis. No free intraperitoneal air. Vascular/Lymphatic: No significant vascular findings are present. No enlarged abdominal or pelvic lymph nodes. Reproductive: Prostate is unremarkable. Other: No abdominal wall hernia or abnormality. No abdominopelvic ascites. Musculoskeletal: No acute or significant osseous findings. IMPRESSION: No acute findings in the abdomen or pelvis. Gastrostomy tube is present and in appropriate position. Electronically Signed   By: GAletta EdouardM.D.   On: 08/08/2021 14:22   DG Hand 2 View Right  Result Date: 08/10/2021 CLINICAL DATA:  Hand and wrist soreness and swelling EXAM: RIGHT WRIST - 2 VIEW; RIGHT HAND - 2 VIEW COMPARISON:  None. FINDINGS: Wrist: There is no acute fracture or dislocation. There is minimal positive ulnar variance. Alignment is otherwise normal. The joint spaces are preserved. The soft tissues are unremarkable. Hand: There is no acute fracture or dislocation. Alignment is normal. There is degenerative change at the thumb MCP joint. The joint spaces are otherwise preserved. The soft tissues are unremarkable. IMPRESSION: Mild degenerative change at the thumb MCP joint. Otherwise, unremarkable hand and wrist radiographs. Electronically Signed   By: PValetta MoleM.D.   On: 08/10/2021 13:33   IR REPLACE G-TUBE SIMPLE WO FLUORO  Result Date:  09/16/2021 INDICATION: Head neck cancer.  G-tube dependent, malfunctioning. EXAM: BEDSIDE EXCHANGE OF GASTROSTOMY TUBE COMPARISON:  CT chest, 08/11/2021.  IR fluoroscopy 09/24/2020. MEDICATIONS: None. CONTRAST:  None FLUOROSCOPY TIME:  None COMPLICATIONS: None immediate. PROCEDURE: A time-out was performed prior to the initiation of the procedure. The indwelling tube and skin surrounding it were prepped and draped using sterile technique. Using manual traction, the existing pull-through gastrostomy tube was removed. The tube structure was degraded and the mushroom tip detached and remain within stomach. A new 16-F MIC G-tube was then inserted through the existing tract. The balloon was inflated with sterile water. The tube was pulled back until the retention balloon was positioned against the anterior stomach wall and the external disc was pushed flush against the skin, securing the tube in place. Gastric contents were aspirated confirming position within the stomach. A dressing was placed. The patient tolerated the procedure well without immediate postprocedural complication. IMPRESSION: 1. Bedside exchange of pull-through gastrostomy tube, with a 20 French balloon-inflatable MIC G-tube. 2. Degraded gastrostomy tube resulting in mushroom tip detachment. This will likely be retained in stomach or may pass in stool, if it transits the pylorus and IC valves. The patient was made aware of the finding. Michaelle Birks, MD [facility] Electronically Signed   By: Michaelle Birks M.D.   On: 09/16/2021 15:37   DG Chest Portable 1 View  Result Date: 08/08/2021 CLINICAL DATA:  Low O2 saturation EXAM: PORTABLE CHEST 1 VIEW COMPARISON:  None FINDINGS: There is a chest port with catheter tip overlying the right atrium. Unchanged, enlarged cardiac silhouette. There are low lung volumes with bibasilar atelectasis and possible small effusions. Nodular opacities in the right  lung consistent with known pulmonary nodules. No new focal airspace disease. No visible pneumothorax. Bilateral shoulder degenerative changes. No acute osseous abnormality. Thoracic spondylosis. IMPRESSION: Low lung volumes with bibasilar subsegmental atelectasis and probable small bilateral pleural effusions. Electronically Signed   By: Maurine Simmering M.D.   On: 08/08/2021 09:54   ECHOCARDIOGRAM COMPLETE  Result Date: 08/12/2021    ECHOCARDIOGRAM REPORT   Patient Name:   Deniz [facility] Date of Exam: 08/12/2021 Medical Rec #:  [id number]              Height:       70.0 in Accession #:    [id number]             Weight:       189.2 lb Date of Birth:  [birth year]              BSA:          2.038 m Patient Age:    40 years               BP:           148/100 mmHg Patient Gender: M                      HR:           102 bpm. Exam Location:  [facility] Procedure: 2D Echo, Color Doppler and Cardiac Doppler Indications:     I63.9 Stroke  History:         Patient has no prior history of Echocardiogram examinations.                  Risk Factors:Hypertension and Dyslipidemia.  Sonographer:     Charmayne Sheer Referring Phys:  [id number] AMRIT ADHIKARI Diagnosing Phys: Yolonda Kida MD  Sonographer Comments: Technically challenging study due to limited acoustic windows. Image acquisition challenging due to patient body habitus and Image acquisition challenging due to  respiratory motion. IMPRESSIONS  1. Left ventricular ejection fraction, by estimation, is 40 to 45%. The left ventricle has mildly decreased function. The left ventricle demonstrates global hypokinesis. Left ventricular diastolic parameters are consistent with Grade II diastolic dysfunction (pseudonormalization).  2. Right ventricular systolic function is normal. The right ventricular size is moderately enlarged.  3. The mitral valve is normal in structure. Trivial mitral valve regurgitation.  4. The aortic valve is grossly normal. Aortic valve regurgitation is not  visualized. FINDINGS  Left Ventricle: Left ventricular ejection fraction, by estimation, is 40 to 45%. The left ventricle has mildly decreased function. The left ventricle demonstrates global hypokinesis. The left ventricular internal cavity size was normal in size. There is  no left ventricular hypertrophy. Left ventricular diastolic parameters are consistent with Grade II diastolic dysfunction (pseudonormalization). Right Ventricle: The right ventricular size is moderately enlarged. No increase in right ventricular wall thickness. Right ventricular systolic function is normal. Left Atrium: Left atrial size was normal in size. Right Atrium: Right atrial size was normal in size. Pericardium: There is no evidence of pericardial effusion. Mitral Valve: The mitral valve is normal in structure. Trivial mitral valve regurgitation. MV peak gradient, 3.9 mmHg. The mean mitral valve gradient is 2.0 mmHg. Tricuspid Valve: The tricuspid valve is grossly normal. Tricuspid valve regurgitation is mild. Aortic Valve: The aortic valve is grossly normal. Aortic valve regurgitation is not visualized. Aortic valve mean gradient measures 2.0 mmHg. Aortic valve peak gradient measures 3.7 mmHg. Aortic valve area, by VTI measures 3.38 cm. Pulmonic Valve: The pulmonic valve was grossly normal. Pulmonic valve regurgitation is not visualized. Aorta: The ascending aorta was not well visualized. IAS/Shunts: No atrial level shunt detected by color flow Doppler.  LEFT VENTRICLE PLAX 2D LVIDd:         4.30 cm  Diastology LVIDs:         3.50 cm  LV e' medial:    5.22 cm/s LV PW:         1.00 cm  LV E/e' medial:  9.8 LV IVS:        0.70 cm  LV e' lateral:   7.72 cm/s LVOT diam:     2.20 cm  LV E/e' lateral: 6.7 LV SV:         52 LV SV Index:   25 LVOT Area:     3.80 cm  LEFT ATRIUM           Index LA diam:      3.10 cm 1.52 cm/m LA Vol (A4C): 25.8 ml 12.66 ml/m  AORTIC VALVE                   PULMONIC VALVE AV Area (Vmax):    3.35 cm    PV  Vmax:       0.82 m/s AV Area (Vmean):   3.45 cm    PV Vmean:      56.400 cm/s AV Area (VTI):     3.38 cm    PV VTI:        0.101 m AV Vmax:           96.30 cm/s  PV Peak grad:  2.7 mmHg AV Vmean:          65.700 cm/s PV Mean grad:  1.0 mmHg AV VTI:            0.153 m AV Peak Grad:      3.7 mmHg AV Mean Grad:      2.0  mmHg LVOT Vmax:         84.90 cm/s LVOT Vmean:        59.700 cm/s LVOT VTI:          0.136 m LVOT/AV VTI ratio: 0.89  AORTA Ao Root diam: 3.70 cm MITRAL VALVE MV Area (PHT): 9.37 cm    SHUNTS MV Area VTI:   3.83 cm    Systemic VTI:  0.14 m MV Peak grad:  3.9 mmHg    Systemic Diam: 2.20 cm MV Mean grad:  2.0 mmHg MV Vmax:       0.99 m/s MV Vmean:      58.9 cm/s MV Decel Time: 81 msec MV E velocity: 51.40 cm/s MV A velocity: 75.80 cm/s MV E/A ratio:  0.68 Dwayne D Callwood MD Electronically signed by Yolonda Kida MD Signature Date/Time: 08/12/2021/4:11:53 PM    Final    CT VENOGRAM HEAD  Result Date: 08/12/2021 CLINICAL DATA:  Follow-up examination for acute stroke, possible dural sinus thrombosis. EXAM: CT ANGIOGRAPHY HEAD AND NECK CT VENOGRAM HEAD TECHNIQUE: Multidetector CT imaging of the head and neck was performed using the standard protocol during bolus administration of intravenous contrast. Multiplanar CT image reconstructions and MIPs were obtained to evaluate the vascular anatomy. Carotid stenosis measurements (when applicable) are obtained utilizing NASCET criteria, using the distal internal carotid diameter as the denominator. Dedicated CT imaging of the dural venous sinuses using the standard protocol following the administration of IV contrast. CONTRAST:  61m OMNIPAQUE IOHEXOL 350 MG/ML SOLN COMPARISON:  Comparison made with previous MRI from 08/10/2021. FINDINGS: CT HEAD FINDINGS Brain: Cerebral volume within normal limits. Previously identified punctate right frontal infarcts not visible by CT. No other acute large vessel territory infarct. No intracranial hemorrhage. No mass  lesion, mass effect or midline shift. No hydrocephalus or extra-axial fluid collection. Vascular: No hyperdense vessel. Skull: Scalp soft tissues and calvarium within normal limits. Sinuses: Scattered mucosal thickening within the ethmoidal air cells and maxillary sinuses. Trace chronic right mastoid effusion. Orbits: Globes and orbital soft tissues within normal limits. Review of the MIP images confirms the above findings CTA NECK FINDINGS Aortic arch: Visualized aortic arch normal in caliber with normal 3 vessel morphology. Minor for age atheromatous change along the undersurface of the aortic arch. No stenosis about the origin of the great vessels. Right carotid system: Right CCA widely patent without stenosis. Ill-defined soft tissue density partially surrounding the proximal and mid right CCA consistent with post treatment changes. Mild for age atheromatous change about the right carotid bulb and proximal right ICA without significant stenosis. Right ICA widely patent distally without stenosis dissection or occlusion. Left carotid system: Left CCA patent from its origin to the bifurcation without stenosis. Mild for age eccentric mixed plaque at the proximal left ICA without significant stenosis. Left ICA patent distally without stenosis, dissection or occlusion. Vertebral arteries: Both vertebral arteries arise from the subclavian arteries. No proximal subclavian artery stenosis. Similarly, ill-defined soft tissue partially surrounding the proximal right vertebral artery consistent with post treatment changes. Vertebral arteries widely patent without stenosis, dissection or occlusion. Skeleton: No visible acute osseous finding. No discrete or worrisome osseous lesions. Torus mandibularis noted. Other neck: Post treatment changes seen within the mid and lower right neck. Mucosal edema within the right greater than left glottic and supraglottic larynx likely reflects post radiation changes. Layering secretions  noted within the hypopharynx. Scattered soft tissue stranding within the right anterolateral neck and retropharyngeal space also consistent with post radiation changes.  Patient's known ill-defined nodal conglomerate just to the right of the esophagus at the thoracic inlet again seen, somewhat difficult to measure, but grossly similar from previous. Possible invasion of the adjacent esophagus which demonstrates irregular ill-defined wall thickening again noted. Changes are relatively similar to prior neck CT from 04/01/2021. Upper chest: Right greater than left layering pleural effusions partially visualized. Associated atelectasis and/or consolidation at the partially visualized right posterior lung. 1.7 cm nodule present at the posterior right upper lobe, measuring slightly larger as compared to recent chest CT from 08/08/2021. Underlying emphysematous changes noted. 1.6 cm precarinal lymph node noted, indeterminate (series 9, image 1). Review of the MIP images confirms the above findings CTA HEAD FINDINGS Anterior circulation: Both internal carotid arteries widely patent to the termini without stenosis. 6 mm focal outpouching arising from the cavernous left ICA consistent with aneurysm (series 11, image 252). This projects laterally. A1 segments patent bilaterally. Normal anterior communicating artery complex. Anterior cerebral arteries patent to their distal aspects without stenosis. No M1 stenosis or occlusion. Normal MCA bifurcations. Distal MCA branches well perfused and symmetric. Posterior circulation: Both V4 segments patent to the vertebrobasilar junction without stenosis. Both PICA origins patent and normal. Basilar widely patent to its distal aspect without stenosis. Superior cerebellar arteries patent bilaterally. Both PCAs primarily supplied via the basilar and are well perfused to there distal aspects. Venous sinuses: Additional delayed venous phase images were performed. Normal enhancement seen  throughout the superior sagittal sinus to the level of the torcula. Left transverse and sigmoid sinus is patent as is the visualized left internal jugular vein. Dominant right transverse and sigmoid sinuses demonstrate heterogeneous early enhancement, but are seen to fill and opacify with additional delayed images, consistent with slow but patent flow. Straight sinus, vein of Galen, internal cerebral veins, and basal veins of Rosenthal are patent. Cavernous sinus appears patent without abnormality. Superior orbital veins grossly symmetric and normal. No appreciable cortical vein thrombosis. Anatomic variants: None significant. Review of the MIP images confirms the above findings IMPRESSION: CT HEAD IMPRESSION: Stable head CT. 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 No other acute intracranial abnormality. CTA HEAD AND NECK IMPRESSION: 1. Negative CTA for large vessel occlusion. Mild atheromatous disease for age, with no hemodynamically significant or correctable stenosis. 2. 6 mm cavernous left ICA aneurysm. 3. Post treatment changes within the lower neck with similar size and appearance of ill-defined right paraesophageal nodal mass. Possible invasion of the adjacent esophagus again noted. 4. Right greater than left layering pleural effusions with associated atelectasis and/or consolidation. 5. 1.7 cm right upper lobe nodule, consistent with metastatic disease. This measures slightly larger in size as compared to recent chest CT from 08/08/2021. 6. 1.6 cm precarinal lymph node, indeterminate. Attention at follow-up recommended. CT VENOGRAM IMPRESSION: 1. No evidence for dural venous sinus thrombosis. 2. Evidence for patent but slow flow within the dominant right transverse and sigmoid sinuses, corresponding with the asymmetric FLAIR signal intensity seen on prior MRI. Electronically Signed   By: Jeannine Boga M.D.   On: 08/12/2021 02:12      ASSESSMENT & PLAN:    1. Encounter for antineoplastic chemotherapy   2. 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[facility] [place])   3. Acquired hypothyroidism   4. Right brachial plexitis   5. Port-A-Cath in place   6. 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 S/p 1 dose  of Keytruda and less than a week of Lenvatinib.  Treatment was interrupted due to admission secondary to aspiration pneumonia, stroke, right brachial plexitis Labs are reviewed and discussed with patient We had a discussion about rationale and potential side effects of methotrexate treatment. Recommend patient to start Folvite 1 mg daily. Patient agrees with the treatment. Proceed with cycle 1 methotrexate 40 mg/m2 today. Patient received IV Compazine as premed and did not tolerate.  We will do Zofran in the future. I refilled antiemetics prescriptions.  #Thrush, recommend nystatin swish and spit.  Prescription sent to pharmacy  # Dysphagia + PEG, continue tube feeding.  continue follow-up with nutritionist. # Right Brachial Plexitis- prednisone, managed by neurology # Hypothyroidism, TSH increased and free T4 is low. Will increase his levothyroxine dose to 37mg daily  Follow-up: 1 week ZEarlie Server MD, PhD [facility]01/2021

## 2021-10-10 ENCOUNTER — Ambulatory Visit
Admission: RE | Admit: 2021-10-10 | Discharge: 2021-10-10 | Disposition: A | Discharge status: Home / Self Care | Payer: Medicare Other | Source: Ambulatory Visit | Attending: Oncology | Admitting: Oncology

## 2021-10-10 DIAGNOSIS — C109 Malignant neoplasm of oropharynx, unspecified: Secondary | ICD-10-CM | POA: Diagnosis present

## 2021-10-10 IMAGING — CT CT NECK W/ CM
4 of 6 series · 13 of 33 positions shown, 15 images · IV contrast (omnipaque)
Comparison: None.

CLINICAL DATA: Head neck cancer. History of oro pharyngeal
carcinoma with multiple treatment courses. Right paratracheal
adenopathy with potential esophagus invasion.

EXAM:
CT NECK WITH CONTRAST
TECHNIQUE: Multidetector CT imaging of the neck was performed using the
standard protocol following the bolus administration of intravenous
contrast.
CONTRAST:  75mL OMNIPAQUE IOHEXOL 300 MG/ML  SOLN

[Series 2: axial neck · axial · 0.37mm/px · z∈[-247,-177]mm · 2 of 106 slices shown]
[im 36/106  bone]
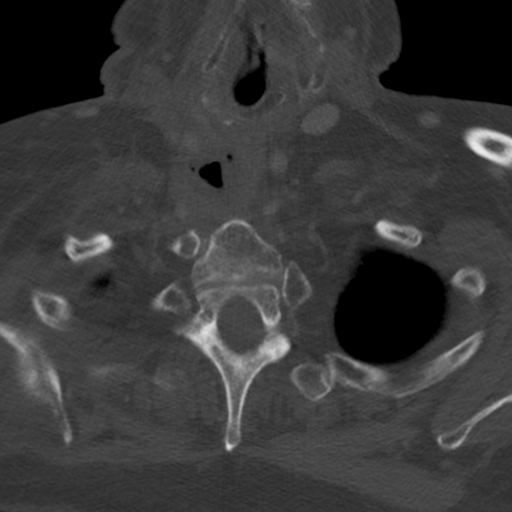
[im 71/106  bone]
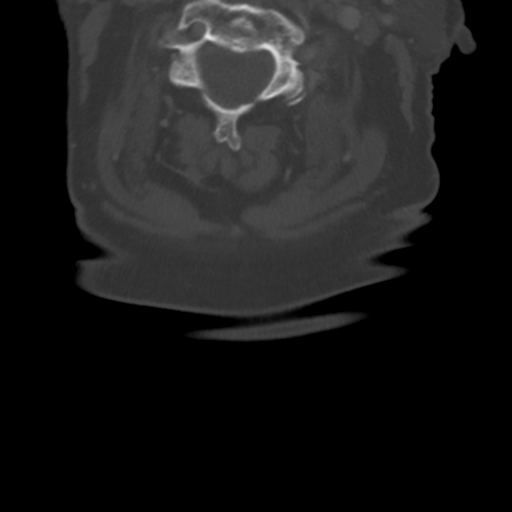

[Series 8: sag neck · sagittal · 0.55mm/px · 5 of 133 slices shown, 6 images]
[im 45/133  bone]
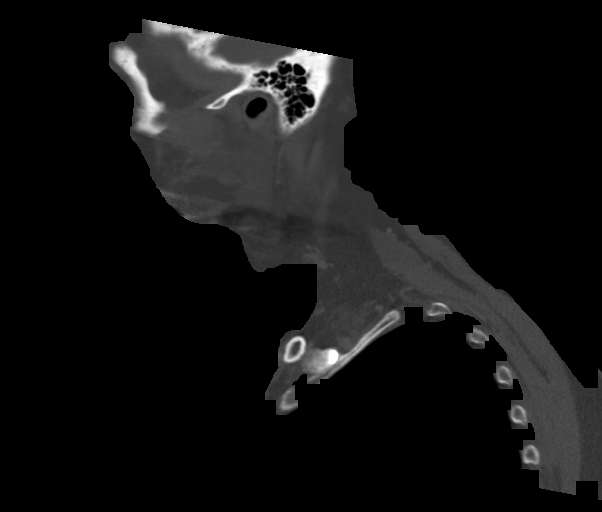
[im 56/133  bone]
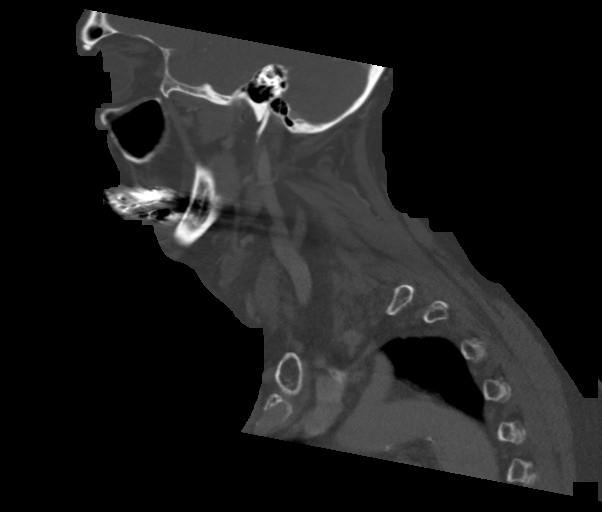
[im 67/133  soft-tissue]
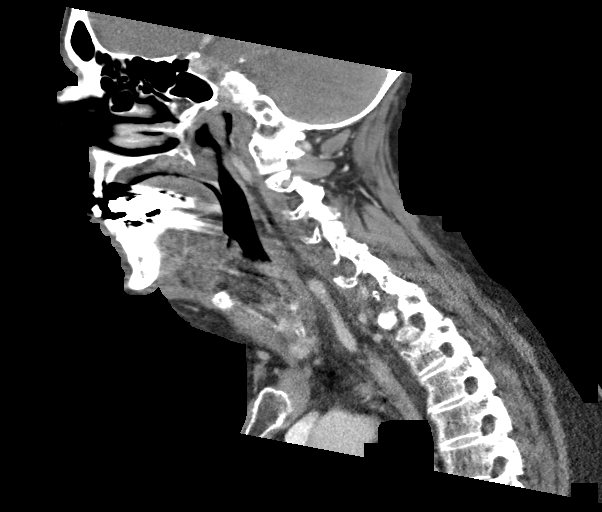
[im 67/133  bone]
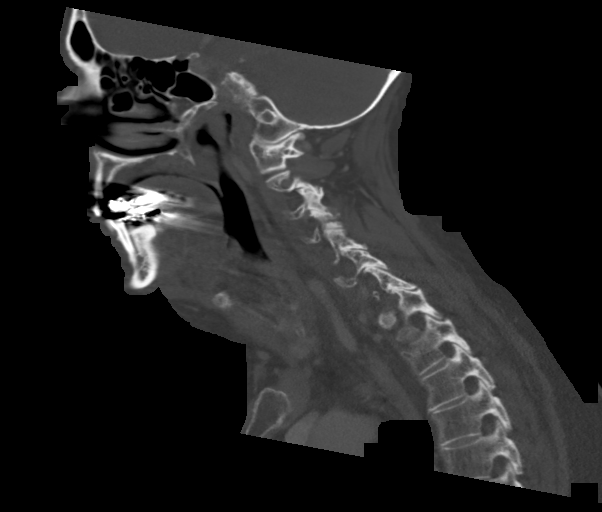
[im 78/133  bone]
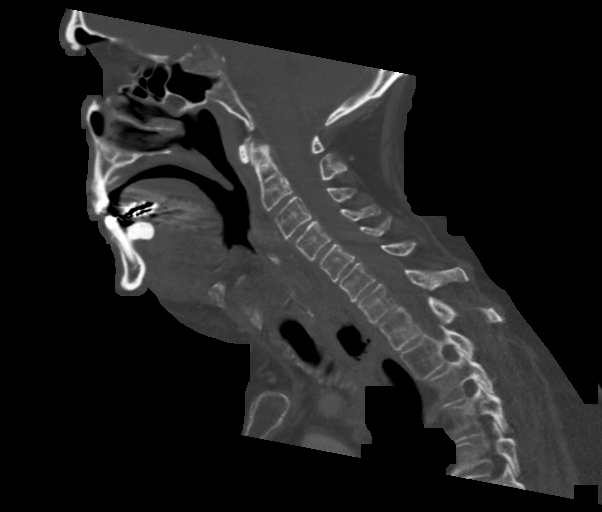
[im 89/133  bone]
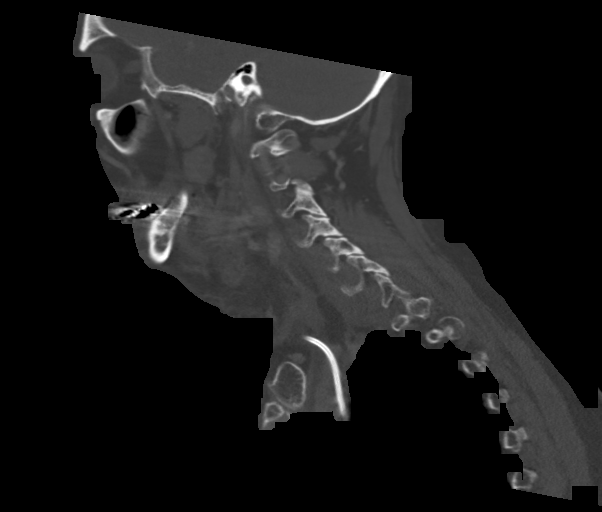

[Series 9: cor neck · coronal · 0.46mm/px · 3 of 165 slices shown]
[im 45/165  bone]
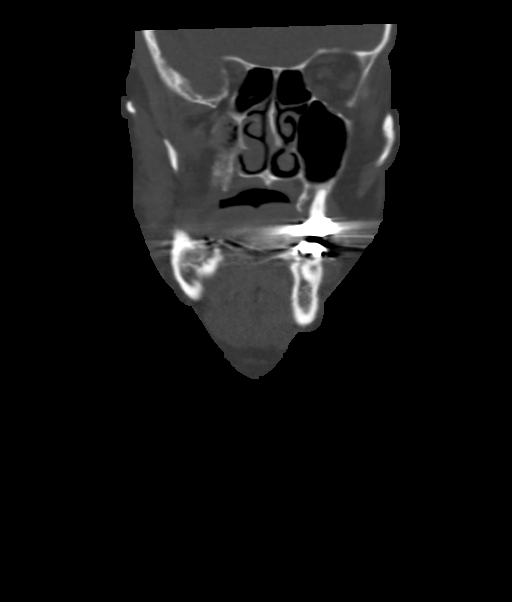
[im 70/165  bone]
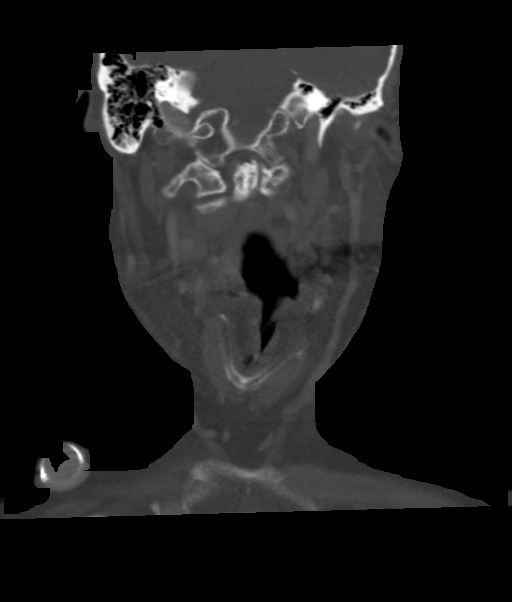
[im 95/165  bone]
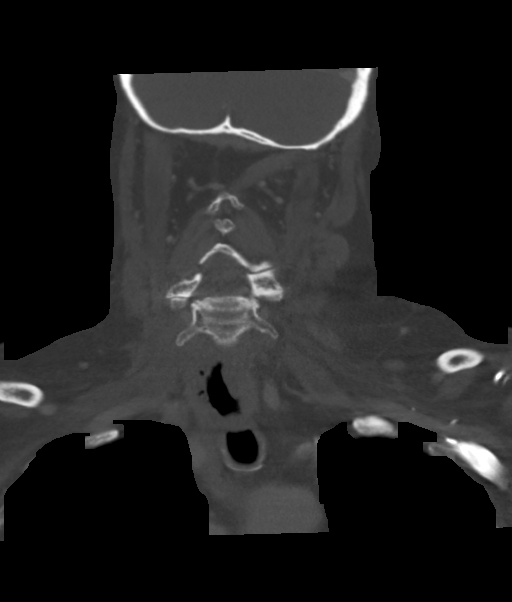

[Series 10: orthogonal ax · axial · 0.55mm/px · z∈[-319,-186]mm · 3 of 135 slices shown, 4 images]
[im 34/135  soft-tissue]
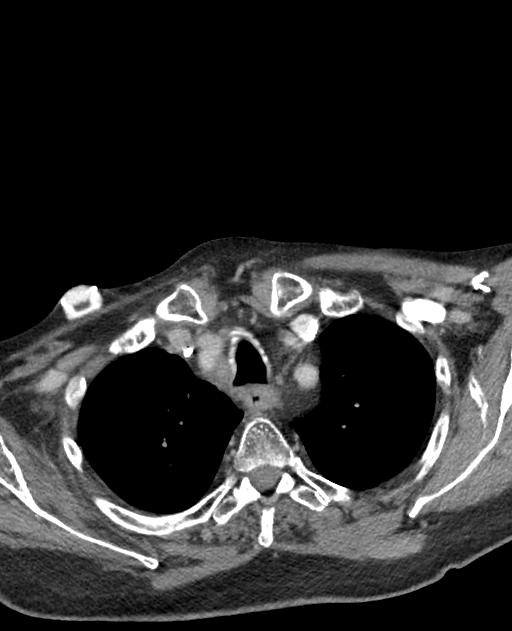
[im 34/135  bone]
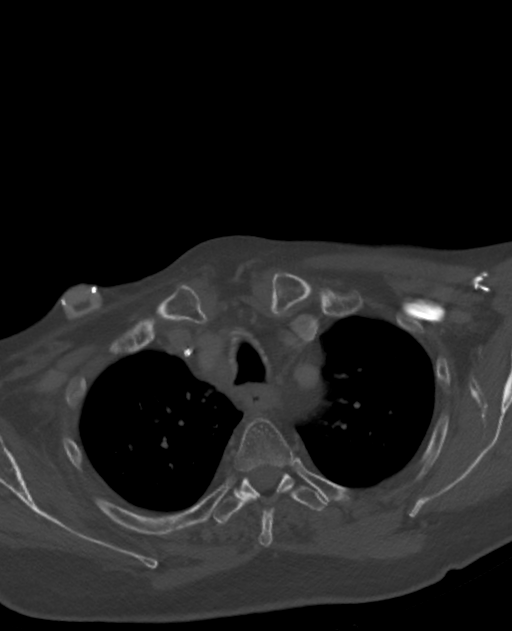
[im 68/135  bone]
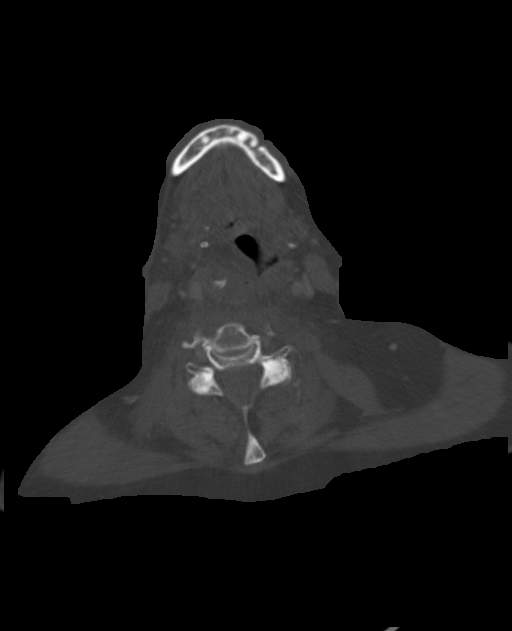
[im 101/135  bone]
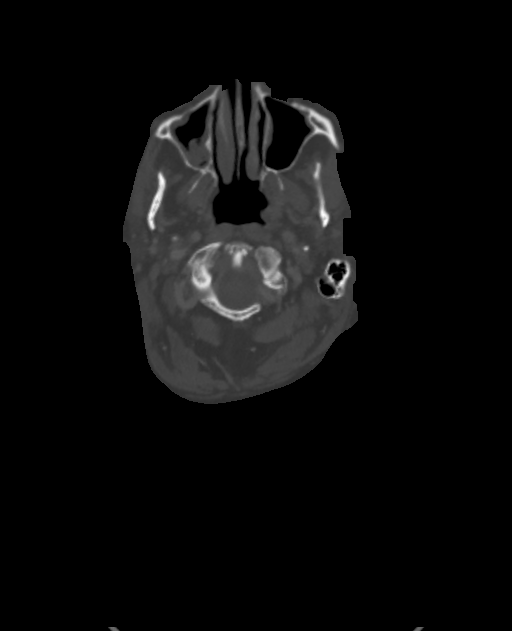

[13 of 33 positions shown; findings below may reference images not displayed]

FINDINGS: Pharynx and larynx: Ill-defined mass at the cervical esophagus,
right eccentric with broad defect in the right lateral wall
contiguous with a gas collection that measures up to 2 cm. Regional
ill-defined soft tissue density affecting the prevertebral space and
extending rightward along the brachial plexus, indistinguishable
from the right vertebral artery in from the posterior wall of the
common carotid. No aneurysm of these vessels.

Right vocal cord paralysis given glottic morphology.

Salivary glands: No acute finding.  Symmetric atrophy.

Thyroid: Symmetric atrophy

Lymph nodes: None enlarged or abnormal density.

Vascular: Negative

Limited intracranial: Negative

Visualized orbits: Negative

Mastoids and visualized paranasal sinuses: Mucosal thickening with
secretions especially in the right maxillary sinus

Skeleton: No detected bony erosion/lesion.

Upper chest: 17 mm nodule in the right upper lobe, increased from
chest CT [DATE]. Heterogeneous right paratracheal node measuring
15 mm.

These results will be called to the ordering clinician or
representative by the Radiologist Assistant, and communication
documented in the PACS or [REDACTED].
IMPRESSION: 1. Necrotic mass at the right-sided thoracic inlet with progressive
involvement of the esophagus where there is a broad defect in the
right lateral wall causing adjacent 2 cm gas cavity, a contained
perforation. There is ill-defined contiguous soft tissue in the
prevertebral space and encroaching on the proximal right brachial
plexus which could be infiltrating tumor.
2. Right paratracheal adenopathy and right upper lobe metastasis
which has enlarged from [DATE].
3. Right vocal cord paralysis.

## 2021-10-10 IMAGING — CT CT CHEST W/ CM
2 of 3 series · 14 of 36 positions shown, 17 images · IV contrast (omnipaque)
Comparison: CT chest, abdomen and pelvis dated [DATE];
chest CT dated [DATE]

CLINICAL DATA: Head neck cancer

EXAM:
CT CHEST WITH CONTRAST
TECHNIQUE: Multidetector CT imaging of the chest was performed during
intravenous contrast administration.
CONTRAST:  75mL OMNIPAQUE IOHEXOL 300 MG/ML  SOLN

[Series 2: axial st · axial · 0.70mm/px · z∈[-464,-234]mm · 11 of 55 slices shown, 14 images]
[im 5/55  mediastinal]
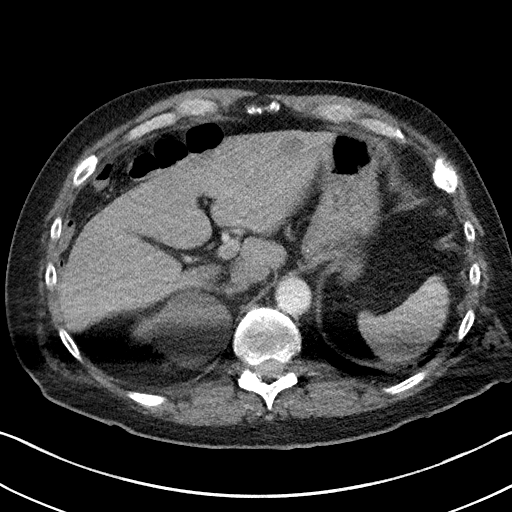
[im 5/55  lung]
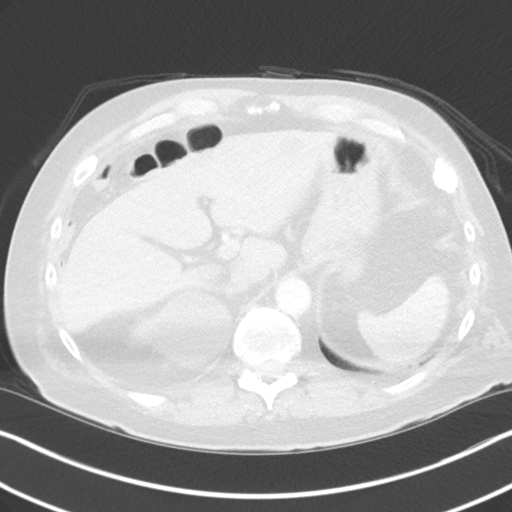
[im 9/55  lung]
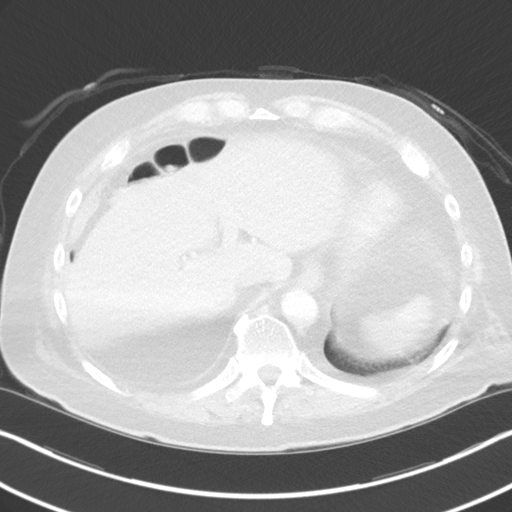
[im 13/55  lung]
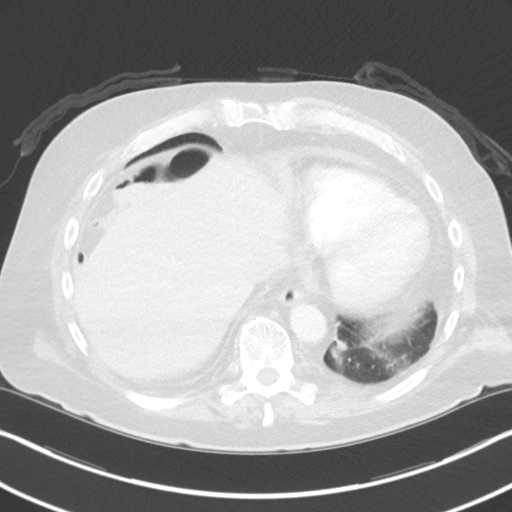
[im 19/55  lung]
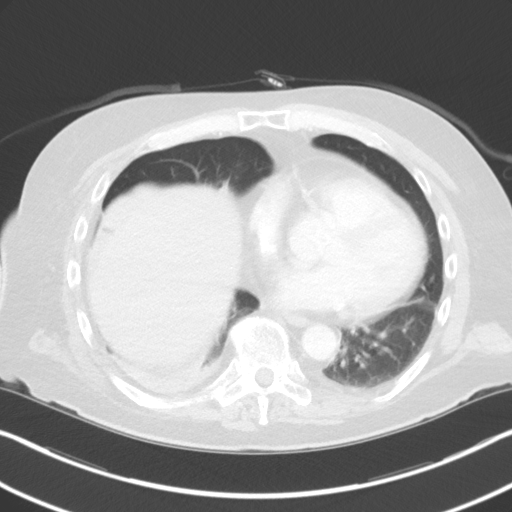
[im 23/55  mediastinal]
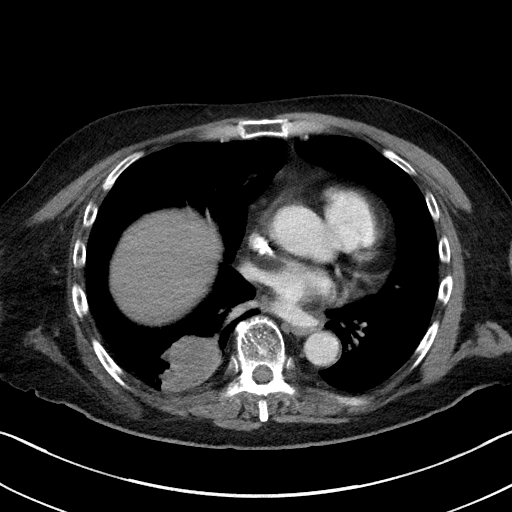
[im 23/55  lung]
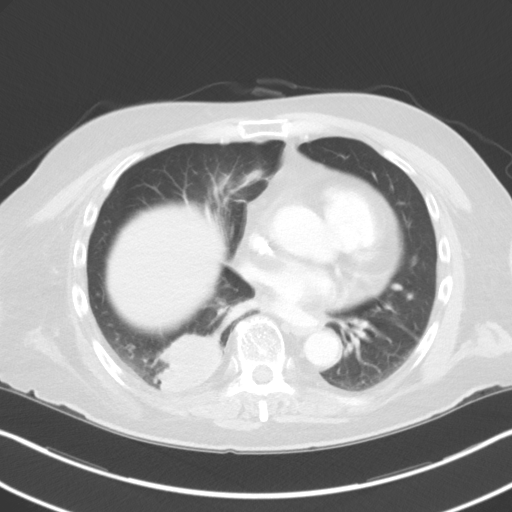
[im 29/55  lung]
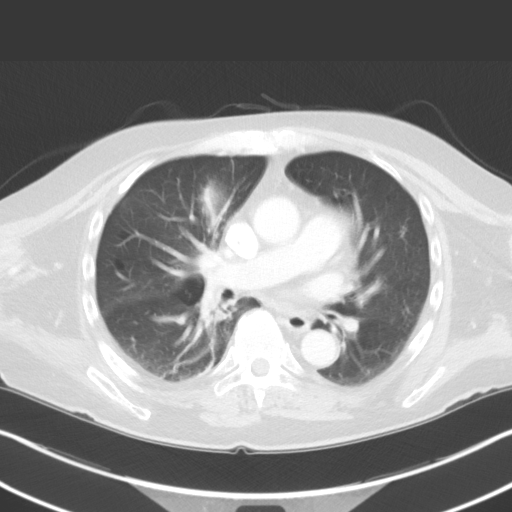
[im 33/55  lung]
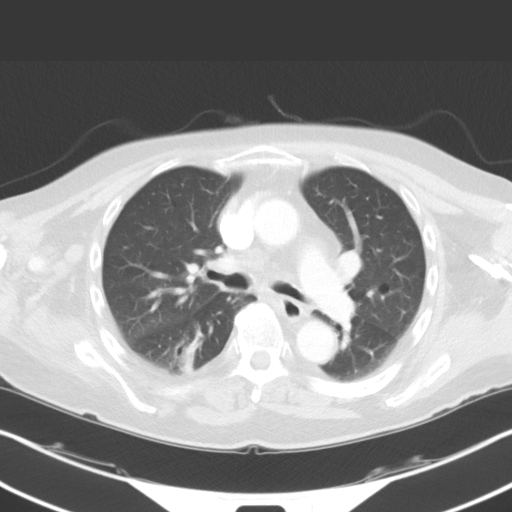
[im 37/55  lung]
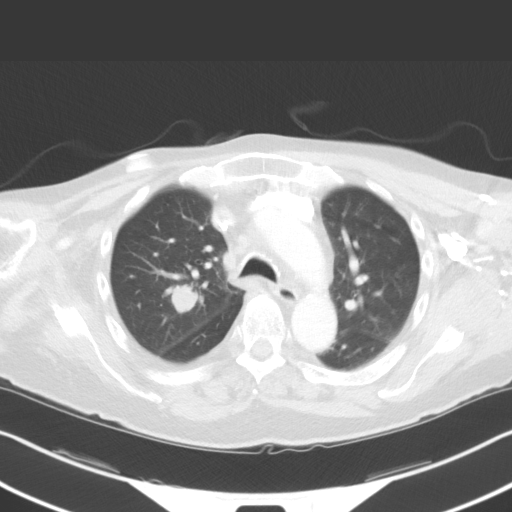
[im 43/55  mediastinal]
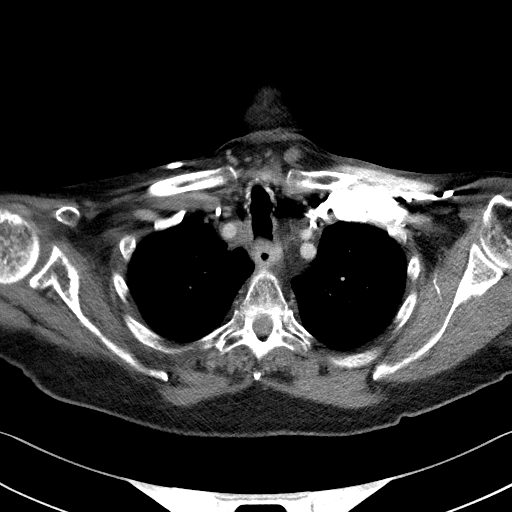
[im 43/55  lung]
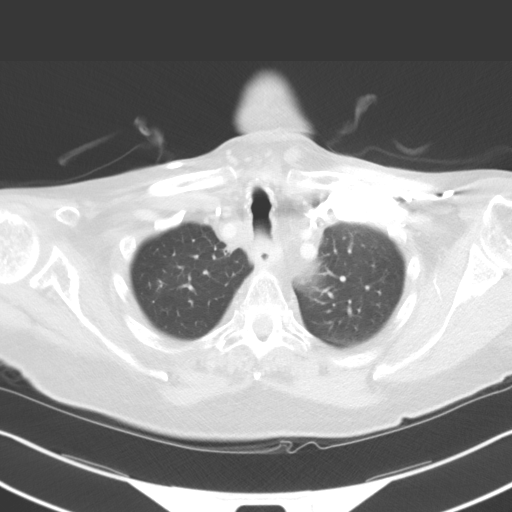
[im 47/55  lung]
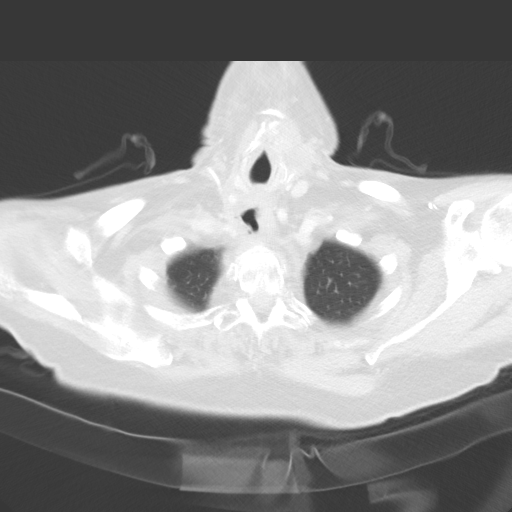
[im 51/55  lung]
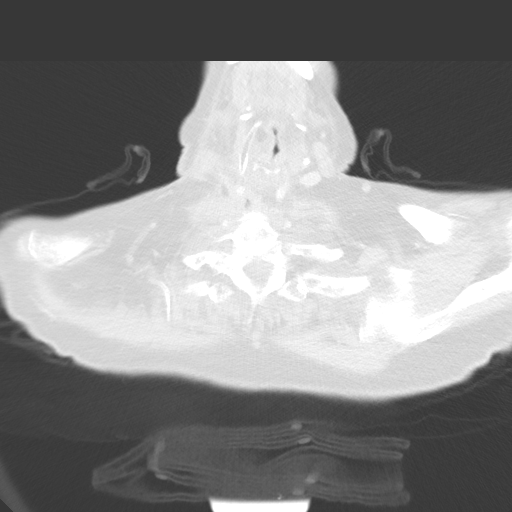

[Series 5: coronal · coronal · 0.55mm/px · 3 of 139 slices shown]
[im 28/139  lung]
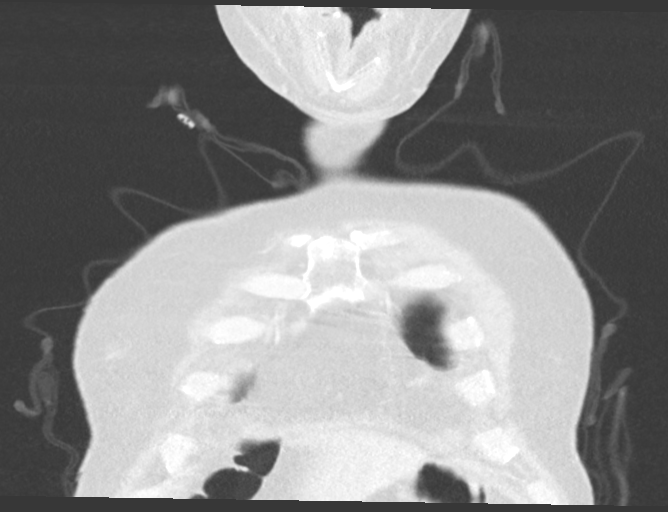
[im 56/139  lung]
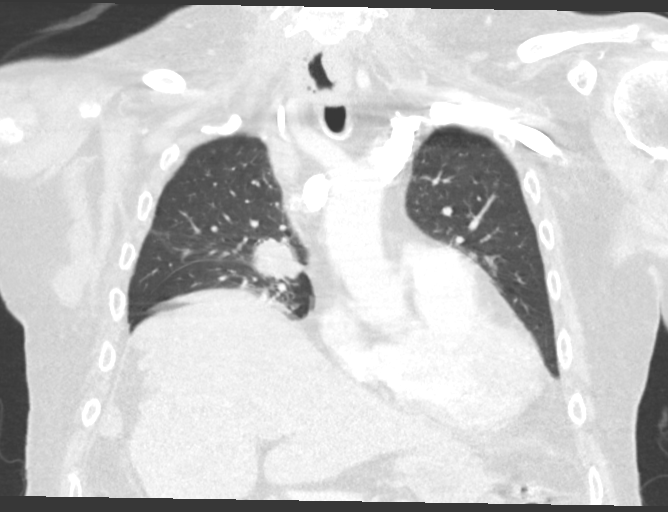
[im 83/139  lung]
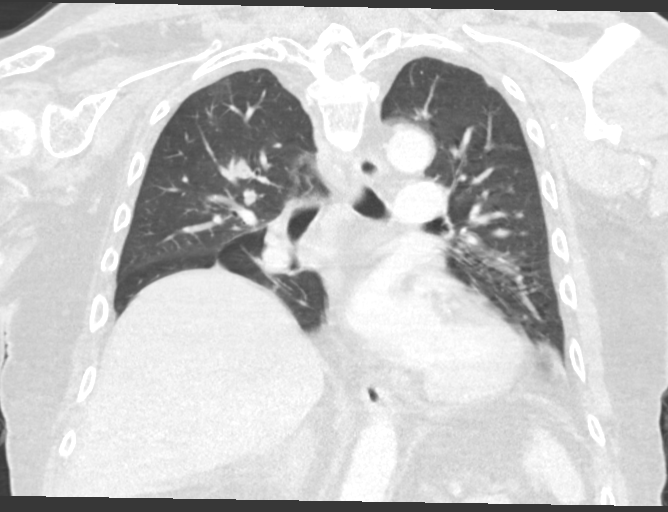

[14 of 36 positions shown; findings below may reference images not displayed]

FINDINGS: Cardiovascular: Normal heart size. Trace pericardial fluid. Coronary
artery calcifications of the LAD and RCA. Mild atherosclerotic
disease of the thoracic aorta.

Mediastinum/Nodes: Previously seen retrotracheal tumor demonstrates
increased cavitation. Correlate with separately dictated neck CT for
further discussion of neck findings. Stable right upper paratracheal
lymph node measuring 1.1 cm on series 2, image 17. No new or
enlarging mediastinal lymph nodes. Prominent left axillary lymph
nodes are no longer seen and were likely reactive.

Lungs/Pleura: Central airways are patent. Near complete resolution
previously seen of atelectasis and airspace consolidations. Interval
increased size of right upper lobe pulmonary nodules. Reference
right upper lobe pulmonary nodule measuring 1.7 x 1.7 cm on series
3, image 20, previously 1.3 x 1.3 cm. Solid mass of the inferior
right upper lobe on image 29 measures 3.4 x 2.9 cm, previously 2.9 x
2.4 cm. Mass of the right lower lobe was obscured on prior exam but
is markedly increased in size when compared with more remote prior
chest CT dated [DATE] and measures 4.9 x 3.5 cm, previously
measured up to 1.2 cm.

Upper Abdomen: New lesion of the left hepatic lobe measuring 1.7 x
1.3 cm on series 2, image 51. Unchanged simple cyst of the right
kidney. Additional low-attenuation lesion of the left lobe of the
liver on image 49, unchanged compared to prior and likely a simple
cyst.

Musculoskeletal: Old left-sided rib fractures. No aggressive osseous
lesions.
IMPRESSION: 1. Interval increased size of right upper lobe pulmonary nodules.
Right lower lobe pulmonary mass was obscured on prior exam due to
atelectasis and consolidations, but is markedly increased in size
when compared with more remote prior chest CT dated [DATE] [DATE], [DATE].
[DATE]. Previously seen retrotracheal tumor demonstrates new central
necrosis. Correlate with separately dictated neck CT report for
further discussion of neck findings.
3. New lesion of the left hepatic lobe, concerning for hepatic
metastatic disease.
4. Prominent left axillary lymph nodes are no longer seen and were
likely reactive.

## 2021-10-10 MED ORDER — IOHEXOL 300 MG/ML  SOLN
75.0000 mL | Freq: Once | INTRAMUSCULAR | Status: AC | PRN
  Administered 2021-10-10: 75 mL via INTRAVENOUS

## 2021-10-12 ENCOUNTER — Inpatient Hospital Stay: Payer: Medicare Other

## 2021-10-12 ENCOUNTER — Other Ambulatory Visit: Payer: Self-pay

## 2021-10-12 ENCOUNTER — Telehealth: Payer: Self-pay | Admitting: *Deleted

## 2021-10-12 ENCOUNTER — Encounter: Payer: Self-pay | Admitting: Oncology

## 2021-10-12 ENCOUNTER — Inpatient Hospital Stay (HOSPITAL_BASED_OUTPATIENT_CLINIC_OR_DEPARTMENT_OTHER): Payer: Medicare Other | Admitting: Oncology

## 2021-10-12 VITALS — BP 115/81 | HR 90 | Temp 96.2°F | Resp 17 | Wt 180.0 lb

## 2021-10-12 DIAGNOSIS — Z95828 Presence of other vascular implants and grafts: Secondary | ICD-10-CM

## 2021-10-12 DIAGNOSIS — C109 Malignant neoplasm of oropharynx, unspecified: Secondary | ICD-10-CM

## 2021-10-12 DIAGNOSIS — G540 Brachial plexus disorders: Secondary | ICD-10-CM

## 2021-10-12 DIAGNOSIS — Z5111 Encounter for antineoplastic chemotherapy: Secondary | ICD-10-CM

## 2021-10-12 DIAGNOSIS — E039 Hypothyroidism, unspecified: Secondary | ICD-10-CM | POA: Insufficient documentation

## 2021-10-12 DIAGNOSIS — Z7189 Other specified counseling: Secondary | ICD-10-CM | POA: Diagnosis not present

## 2021-10-12 DIAGNOSIS — K223 Perforation of esophagus: Secondary | ICD-10-CM | POA: Insufficient documentation

## 2021-10-12 LAB — CBC WITH DIFFERENTIAL/PLATELET
Abs Immature Granulocytes: 0.1 10*3/uL — ABNORMAL HIGH (ref 0.00–0.07)
Basophils Absolute: 0 10*3/uL (ref 0.0–0.1)
Basophils Relative: 0 %
Eosinophils Absolute: 0.1 10*3/uL (ref 0.0–0.5)
Eosinophils Relative: 1 %
HCT: 31.1 % — ABNORMAL LOW (ref 39.0–52.0)
Hemoglobin: 10.2 g/dL — ABNORMAL LOW (ref 13.0–17.0)
Immature Granulocytes: 1 %
Lymphocytes Relative: 2 %
Lymphs Abs: 0.3 10*3/uL — ABNORMAL LOW (ref 0.7–4.0)
MCH: 31 pg (ref 26.0–34.0)
MCHC: 32.8 g/dL (ref 30.0–36.0)
MCV: 94.5 fL (ref 80.0–100.0)
Monocytes Absolute: 0.9 10*3/uL (ref 0.1–1.0)
Monocytes Relative: 7 %
Neutro Abs: 10.9 10*3/uL — ABNORMAL HIGH (ref 1.7–7.7)
Neutrophils Relative %: 89 %
Platelets: 326 10*3/uL (ref 150–400)
RBC: 3.29 MIL/uL — ABNORMAL LOW (ref 4.22–5.81)
RDW: 16.2 % — ABNORMAL HIGH (ref 11.5–15.5)
WBC: 12.3 10*3/uL — ABNORMAL HIGH (ref 4.0–10.5)
nRBC: 0 % (ref 0.0–0.2)

## 2021-10-12 LAB — COMPREHENSIVE METABOLIC PANEL
ALT: 23 U/L (ref 0–44)
AST: 22 U/L (ref 15–41)
Albumin: 3 g/dL — ABNORMAL LOW (ref 3.5–5.0)
Alkaline Phosphatase: 54 U/L (ref 38–126)
Anion gap: 8 (ref 5–15)
BUN: 25 mg/dL — ABNORMAL HIGH (ref 8–23)
CO2: 31 mmol/L (ref 22–32)
Calcium: 8.5 mg/dL — ABNORMAL LOW (ref 8.9–10.3)
Chloride: 90 mmol/L — ABNORMAL LOW (ref 98–111)
Creatinine, Ser: 0.87 mg/dL (ref 0.61–1.24)
GFR, Estimated: >60 mL/min (ref >60)
Glucose, Bld: 103 mg/dL — ABNORMAL HIGH (ref 70–99)
Potassium: 4.5 mmol/L (ref 3.5–5.1)
Sodium: 129 mmol/L — ABNORMAL LOW (ref 135–145)
Total Bilirubin: 0.4 mg/dL (ref 0.3–1.2)
Total Protein: 5.9 g/dL — ABNORMAL LOW (ref 6.5–8.1)

## 2021-10-12 MED ORDER — SODIUM CHLORIDE 0.9% FLUSH
10.0000 mL | Freq: Once | INTRAVENOUS | Status: AC
  Administered 2021-10-12: 10 mL via INTRAVENOUS
  Filled 2021-10-12: qty 10

## 2021-10-12 MED ORDER — FLUCONAZOLE 100 MG PO TABS: 100.0000 mg | ORAL_TABLET | Freq: Every day | ORAL | 0 refills | Status: AC

## 2021-10-12 MED ORDER — HEPARIN SOD (PORK) LOCK FLUSH 100 UNIT/ML IV SOLN
500.0000 [IU] | Freq: Once | INTRAVENOUS | Status: DC
  Administered 2021-10-12: 500 [IU] via INTRAVENOUS
  Filled 2021-10-12: qty 5

## 2021-10-12 NOTE — Progress Notes (Signed)
Hematology/Oncology  Follow Up note [phone] [phone]   Patient Care Team: Albina Billet, MD as PCP - General (Internal Medicine) Albina Billet, MD (Internal Medicine) Bary Castilla, Forest Gleason, MD (General Surgery) Noreene Filbert, MD as Referring Physician (Radiation Oncology) Earlie Server, MD as Consulting Physician (Oncology)  CHIEF COMPLAINTS/PURPOSE OF CONSULTATION:  Follow up for head and neck cancer.  HISTORY OF PRESENTING ILLNESS:  Ivan Garcia. is a  65 y.o.  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 P16 positive. cT2 cN2 disease, stage II, # 12/13/2017: PET whole body: Primary hypermetabolic mucosal lesion in the left oropharynx/tongue base with right-sided bulky multilevel lymphadenopathy. #12/31/2017. Concurrent ChemoRT [Cisplatin 100 mg/m2 q3weeks]  S/p one dose of Cisplatin. Cisplatin discontinued due to nephrotoxicity/AKI . switch to weekly Carboplatin (AUC 2) / Taxol 65m/m2 [ finished May 2019] # 03/08/2018: PET image skull base to midthigh restaging: Near complete resolution of prior large right neck mass. Small right cervical/supraclavicular nodal metastases measuring up to 10 mm short axis. No evidence of distant metastases.  # 07/02/2018 PET No evidence of residual carcinoma in the neck. No evidence of nodal metastasis. No evidence distant metastatic disease  # July 2020, he started to have hoarseness after singing for 2 to 3 hours. He has changed his insurance and Dr. MTami Ribasis no longer covered by his current insurance.  08/04/2019 establish care [place]ny [place] . Flexible laryngoscopy showed Vocal cord paralysis.  Patient was recommended to have a PET scan done for restaging. 08/20/2019 PET scan showed asymmetric hypermetabolic  involving the left vocal cords with  hypermetabolic adenopathy in the neck, and the chest.  He also has hypermetabolic pulmonary nodule along the minor fissure which is felt to be metastatic.  # 09/08/2019   ultrasound-guided right supraclavicular lymph node biopsy Pathology was positive for metastatic squamous cell carcinoma. # 09/08/2019 Omniseq NGS showed PD-L1 CPS 20, TMB indeterminant, MSI stable,PIK3CA [id number] # 09/29/2019- 01/2020  6 cycles of carboplatin, 5-FU and Keytruda, finished in February 2021.  #12/18/2019, PET/CT: marked improvement in met disease, continued on Keytruda maintenance. # 04/26/2020 PET worsening of disease in the chest.  Interval enlargement of the right thoracic inlet and paratracheal lymph nodes that were previously enlarged with new activity in the right hilum corresponding to a lymph node in this location.  Also increased activity in the right upper lobe nodule. Right level 3 lymph node in the neck and anterior mediastinal nodal activity is diminished.  These areas are quite small on prior study. Diminished symmetric uptake within the left as compared to the right vocal cord. # [facility].  Patient was offered for clinical trial with pembrolizumab/lenvatinib versus standard therapy.  05/14/2021 Patient received additional 1 cycle of Keytruda while waiting to be enrolled to the trial.   #06/24/2020, CT [facility] upper paratracheal node is concerning for metastatic disease and a new from 01/20/2020 PET/CT.  The node closely abuts and possibly invade the posterolateral right tracheal wall as well as the esophagus.  Right vocal cord paralysis.  Symmetric soft tissue density at the right base of tongue could reflect residual/recurrent primary malignancy versus posttreatment changes.  Right supraclavicular lymph node is no longer appreciated with post treatment changes.  There is increased size of right  minor fissure nodule which was metabolically avid on previous PET scan.  New mediastinal and hilar lymphadenopathy concerning for new nodal disease.  No evidence of metastatic disease below the diaphragm.  Nonobstructing 3 mm distal ureter stone.- Disease progression.  Not eligible for the clinical trial at [facility]  # 07/07/2020 09/02/2020 palliative chemotherapy with 3 cycles of docetaxel  with G-CSF support.   # 09/02/2020,chest without contrast with 3D MIPS protocol showed Interval increase in the size of the right upper lobe perifissural nodule 1.6 x 1.5 cm-previously 1.4 x 1.2 cm tracheoesophageal soft tissue mass 3.4 x 5.0 cm with regional mass-effect on the esophagus-previously 2.4 x 3.6 cm, and mediastinal lymphadenopathy-right upper paratracheal lymph node 1.9 cm-previously 1.5 cm, suspicious for progressive metastatic disease.Subcentimeter lung nodules are stable. CT neck with contrast showed slight interval increase in size of upper right paratracheal nodal conglomerate.  Questionable soft tissue invasion of the posterior tracheal appears worsened.  Compression with potential invasion of the esophagus is again noted.  Prior vocal cord paralysis again noted.  Unchanged right tongue base asymmetry. I had a discussion with patient's [facility] oncologist Dr. Barrington Ellison over the phone. Dr.Choe recommended adding carboplatin AUC 5 to docetaxel regimen for now which hopefully to help to reduce the size of the tracheoesophageal soft tissue mass.  Also recommend palliative radiation. # 09/07/2020, carboplatin AUC 5+ docetaxel # 10/21/2021S/p PEG tube placement   #09/28/2020- 10/25/20 patient finished palliative radiation to esophageal mass. #Patient establish care with [facility] Dr. Maxie Better   09/29/2020  guardant 360 which showed PIK3CA E545K, PIK3CA amplification. MSI stable, APC E2172K VUS , APC L2138L, FGFR2 R573 VUS   . # 11/09/2020, patient had a PET scan which showed partial response Right thoracic inlet  Hypermetabolic soft tissue metastasis, 3.9 x 2.6cm, decreased in size, decreased size of  Hypermetabolic right paratracheal 1.2cm, and right hilar nodal Metastases since outside 09/02/2020 neck CT Right upper lobe lung nodule 1.2cm, stable size.  RadOnc Dr. Baruch Gouty recommend additional radiation.  #11/23/20-12/17/19 patient finished additional radiation # 12/22/2020,Piqray 368m daily  discontinued on 03/31/2021 04/01/2021 CT Neck/Chest abdomen pelvis  Progressive enlargement of the right larynx and supraglottic tissues with low-density most compatible with edema from radiation. Correlate with direct visualization mucosa to evaluate for tumor recurrence in this area Right paraesophageal lymph node mass is ill-defined and unchanged in size. Possible invasion of the esophagus.  # [facility] 1 on [facility]/2021.  Unfortunately patient developed grade 4 infusion related reaction, syncope while on toilet which progressed to cardiopulmonary arrest requiring CPR for 30 to 60 seconds until ROSC, epinephrine, sent to emergency room and he was admitted for 24 hours.  He has sinus tachycardia and had a TEE done which normal LVEF.  [facility]  Patient was seen by Dr.Lycan on 05/13/2021, was recommended to allow cetuximab washout and also recently being informed that he is not eligible for another clinical trial at this point. Patient was also informed that he was not eligible for another clinical try at this point.  [facility] W[facility]ansky  Several options were discussed. First-in-human Study of DRP-104 (Sirpiglenastat) as Single Agent and in Combination With [employer] Alternative approaches was desensitization to cetuximab in a controlled intensity monitor for any environment.  Keytruda and lenvatinib, based current promising OR ranging from 28 to 36% and DFS 4-8  months.  [Journal of Clinical Oncology 36, no. 15_suppl (Apr 22, 2017) 6016-6016], [employer] 2021 Apr ;84(4):361-367]. Patient is interested in proceeding with Keytruda and lenvatinib locally at [facility].  /29/2022 Patient was started on Keytruda and lenvatinib. 08/08/2021 - 08/14/2021, patient was admitted to [facility] due to multilobar aspiration pneumonia.  Treated with antibiotics.  He developed acute on chronic weakness of right upper extremity.  MRI of the brain showed acute/subacute ischemic infarct of the right frontal lobe. 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  Not able to distinguish the etiology as opposed to radiation effect versus secondary to infiltrative malignancy, or medication induced side effects..  Patient was seen by neurology and was felt that EMG/nerve conduction study can be useful in these cases as a finding of myokymia would support a diagnosis of post radiation effect.   In order to preserve the function of his dominant right hand as much as possible and potentially even reverse some of his weakness, accurately determine the etiology of the brachial plexitis is critical.  It was felt that patient needs to be transferred to higher level of care where EMG/nerve conduction study could be done inpatient.  [facility] has no bed availability patient was transferred to [facility] 08/15/2021, EMG at [facility] marked abnormality, electrodiagnostic evidence of acute right upper trunk brachial plexopathy.  There is no myotonic discharges to suggest radiation-induced plexopathy.  It was felt that this is likely a combination of radiation/chemotherapy/checkpoint inhibitor side effects.  Given the inflammation on imaging, patient was started on steroid tapering course.  [facility]  Patient's current Is currently on on 5 mg of prednisone daily to complete steroid on 09/18/2021.  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 CHF, LVEF 40-45%, he was started on metoprolol,empagliflozin   -08/08/2021, CT chest PE protocol showed bilateral aspiration pneumonia.  Progression of metastatic disease in the right lung with enlargement of superior right upper lobe and inferior right upper lobe nodule.  Similar appearance of the retrotracheal tumor at the level of the thoracic inlet.  Increased prominence of left axillary lymph node -06/16/2021, patient had a PET scan at [facility] are available in [place]. I spoke with radiology Dr. Nelia Shi.  Comparing to his PET scan in July 2022, patient has had a disease progression in his lungs nodules.   There was no uptake activities in his right brachial plexus.  Lenvatinib was held during his admission.  09/26/2021, patient was seen by [facility] oncology Dr. Mariel Kansky for discussion of his options.  1 option is to do desensitization cetuximab and the other option is to try methotrexate.  Patient hoped to do methotrexate weekly and prefers the treatment to be done locally with [facility].  Patient has been discharged from rehab and lives at home now.  Presents for evaluation prior to chemotherapy.  Patient does feeding tubes.  He participated in home physical therapy treatments.  Denies any nausea vomiting diarrhea, shortness of breath cough.  Right upper extremity weakness, slightly improving.  Patient is on prednisone for radiation brachial plexitis and follows up with neurology Dr. Mickeal Skinner.  INTERVAL HISTORY Ivan Garcia. is a 65 y.o. male who presents for follow-up for metastatic squamous cell carcinoma, P 16+.   Patient received 1 dose of methotrexate.  He reports tolerating treatment today.  He has noticed spitting out nickel size bright red blood occasionally.  Denies any pain, fever, chills, sweats shortness of breath.  He is on tube feeding. Patient had a CT scan done recently and report just resulted this morning.    Review of Systems  Constitutional:  Negative for  chills, fever, malaise/fatigue and weight loss.  HENT:  Negative for sore throat.        Dysphagia   Eyes:  Negative for redness.  Respiratory:  Negative for cough, shortness of breath and wheezing.   Cardiovascular:  Negative for chest pain, palpitations and leg swelling.  Gastrointestinal:  Negative for abdominal pain, blood in stool, nausea and vomiting.  Genitourinary:  Negative for dysuria.  Musculoskeletal:  Negative for myalgias.       Right upper extremity weakness  Skin:  Negative for rash.  Neurological:  Negative for dizziness, tingling and tremors.  Endo/Heme/Allergies:  Does not bruise/bleed easily.  Psychiatric/Behavioral:  Negative for hallucinations.    MEDICAL HISTORY:  Past Medical History:  Diagnosis Date   Arthritis    Cancer ([place])    Head and neck cancer   Hemorrhoids    Hyperlipidemia    Hypertension     SURGICAL HISTORY: Past Surgical History:  Procedure Laterality Date   COLONOSCOPY WITH PROPOFOL N/A 04/04/2017   Procedure: COLONOSCOPY WITH PROPOFOL;  Surgeon: Robert Bellow, MD;  Location: [facility] ENDOSCOPY;  Service: Endoscopy;  Laterality: N/A;   IR GASTROSTOMY TUBE MOD SED  09/24/2020   IR REPLACE G-TUBE SIMPLE WO FLUORO  09/16/2021   MANDIBLE SURGERY     PORT-A-CATH REMOVAL  07/17/2018   PORTA CATH INSERTION N/A 12/19/2017   Procedure: PORTA CATH INSERTION;  Surgeon: Algernon Huxley, MD;  Location: [place] CV LAB;  Service: Cardiovascular;  Laterality: N/A;   PORTA CATH INSERTION N/A 07/17/2018   Procedure: PORTA CATH INSERTION;  Surgeon: Algernon Huxley, MD;  Location: [place] CV LAB;  Service: Cardiovascular;  Laterality: N/A;   PORTA CATH INSERTION N/A 09/18/2019   Procedure: PORTA CATH INSERTION;  Surgeon: Algernon Huxley, MD;  Location: [place] CV LAB;  Service: Cardiovascular;  Laterality: N/A;   TONSILLECTOMY      SOCIAL HISTORY: Social History   Socioeconomic History   Marital status: Single    Spouse name: Not on file    Number of children: Not on file   Years of education: Not on file   Highest education level: Not on file  Occupational History   Not on file  Tobacco Use   Smoking status: Never   Smokeless tobacco: Never  Vaping Use   Vaping Use: Never used  Substance and Sexual Activity   Alcohol use: No   Drug use: No   Sexual activity: Not on file  Other Topics Concern   Not on file  Social History Narrative   Not on file   Social Determinants of Health   Financial Resource Strain: Not on file  Food Insecurity: Not on file  Transportation Needs: Not on file  Physical Activity: Not on file  Stress: Not on file  Social Connections: Not on file  Intimate Partner Violence: Not on file    FAMILY HISTORY: Family History  Problem Relation Age of Onset   Breast cancer Mother    Asthma Mother    Congestive Heart Failure Mother    Prostate cancer Father    Brain cancer Father    Bladder Cancer Father     ALLERGIES:  is allergic to cetuximab.  MEDICATIONS:  Current Outpatient Medications  Medication Sig Dispense Refill   amLODipine (NORVASC) 5 MG tablet Take 5  mg by mouth daily.     aspirin 81 MG chewable tablet Place 1 tablet (81 mg total) into feeding tube daily. 30 tablet 0   atorvastatin (LIPITOR) 40 MG tablet Place 1 tablet (40 mg total) into feeding tube daily. 30 tablet 0   fluconazole (DIFLUCAN) 100 MG tablet Take 1 tablet (100 mg total) by mouth daily. Initial 250m, then 1068mdaily until finish 11 tablet 0   folic acid (FOLVITE) 1 MG tablet Take 1 tablet (1 mg total) by mouth daily. 90 tablet 1   gabapentin (NEURONTIN) 100 MG capsule TAKE 2 CAPSULES BY MOUTH 3 TIMES DAILY.     HYDROcodone-acetaminophen (NORCO) 10-325 MG tablet Take 1 tablet by mouth every 6 (six) hours as needed. 60 tablet 0   levothyroxine (SYNTHROID) 88 MCG tablet Take 1 tablet (88 mcg total) by mouth daily before breakfast. 30 tablet 0   lidocaine-prilocaine (EMLA) cream Apply to affected area once 30 g 3    metoprolol succinate (TOPROL-XL) 25 MG 24 hr tablet Take 1 tablet (25 mg total) by mouth daily. 30 tablet 0   Nutritional Supplements (FEEDING SUPPLEMENT, KATE FARMS STANDARD 1.4,) LIQD liquid Place 325 mLs into feeding tube 5 (five) times daily. 1000 mL 0   nystatin (MYCOSTATIN) 100000 UNIT/ML suspension Use as directed 5 mLs (500,000 Units total) in the mouth or throat in the morning, at noon, and at bedtime. SWISH AND SPIT 473 mL 1   ondansetron (ZOFRAN) 8 MG tablet Take 1 tablet (8 mg total) by mouth 2 (two) times daily as needed for refractory nausea / vomiting. Start on day 3 after carboplatin chemo. 30 tablet 1   predniSONE (DELTASONE) 20 MG tablet Take 1 tablet (20 mg total) by mouth daily with breakfast. 60 tablet 3   acetaminophen (TYLENOL) 160 MG/5ML solution Take by mouth. (Patient not taking: Reported on 10/12/2021)     ALPRAZolam (XANAX) 0.25 MG tablet Place 1 tablet (0.25 mg total) into feeding tube at bedtime as needed for anxiety. (Patient not taking: No sig reported) 30 tablet 0   cetirizine (ZYRTEC) 10 MG tablet Take by mouth. (Patient not taking: Reported on 10/12/2021)     diphenoxylate-atropine (LOMOTIL) 2.5-0.025 MG/5ML liquid Take 5 mLs by mouth 4 (four) times daily as needed for diarrhea or loose stools. (Patient not taking: No sig reported) 60 mL 0   enoxaparin (LOVENOX) 40 MG/0.4ML injection Inject 0.4 mLs (40 mg total) into the skin daily. 1 mL 0   famotidine (PEPCID) 20 MG tablet Take by mouth. (Patient not taking: Reported on 10/12/2021)     insulin aspart (NOVOLOG) 100 UNIT/ML injection Inject 0-9 Units into the skin every 4 (four) hours. (Patient not taking: No sig reported) 10 mL 11   labetalol (NORMODYNE) 5 MG/ML injection Inject 2 mLs (10 mg total) into the vein every 3 (three) hours as needed (SBP more than 160 or DBP more than 100). 20 mL 0   morphine 2 MG/ML injection Inject 1 mL (2 mg total) into the vein every 4 (four) hours as needed. (Patient not taking: No sig  reported) 1 mL 0   oxyCODONE (ROXICODONE) 5 MG/5ML solution PLACE 5-10 MLS (5-10 MG TOTAL) INTO FEEDING TUBE EVERY 4 (FOUR) HOURS AS NEEDED FOR SEVERE PAIN. (Patient not taking: No sig reported)     pantoprazole sodium (PROTONIX) 40 mg PACK Place 20 mLs (40 mg total) into feeding tube daily. (Patient not taking: Reported on 10/12/2021) 30 packet 0   pregabalin (LYRICA) 150 MG capsule Take  1 capsule (150 mg total) by mouth 2 (two) times daily. (Patient not taking: Reported on 10/12/2021) 60 capsule 3   prochlorperazine (COMPAZINE) 10 MG tablet Take 1 tablet (10 mg total) by mouth every 6 (six) hours as needed (Nausea or vomiting). (Patient not taking: Reported on 10/12/2021) 30 tablet 1   senna-docusate (SENOKOT-S) 8.6-50 MG tablet Take 2 tablets by mouth daily. (Patient not taking: No sig reported) 60 tablet 3   sodium chloride 0.9 % infusion Inject 1,000 mLs into the vein as needed (for administration of IV medications (carrier fluid)). (Patient not taking: No sig reported) 1000 mL 0   traMADol (ULTRAM) 50 MG tablet Place 1 tablet (50 mg total) into feeding tube every 6 (six) hours as needed for moderate pain. (Patient not taking: No sig reported) 30 tablet 0   Water For Irrigation, Sterile (FREE WATER) SOLN Place 120 mLs into feeding tube 5 (five) times daily. (Patient not taking: No sig reported) 300 mL 0   No current facility-administered medications for this visit.   Facility-Administered Medications Ordered in Other Visits  Medication Dose Route Frequency Provider Last Rate Last Admin   sodium chloride flush (NS) 0.9 % injection 10 mL  10 mL Intravenous PRN Earlie Server, MD       sodium chloride flush (NS) 0.9 % injection 10 mL  10 mL Intravenous PRN Earlie Server, MD   10 mL at 10/13/20 1034     PHYSICAL EXAMINATION: ECOG PERFORMANCE STATUS: 1 - Symptomatic but completely ambulatory Vitals:   10/12/21 0849  BP: 115/81  Pulse: 90  Resp: 17  Temp: (!) 96.2 F (35.7 C)  SpO2: 96%   Physical  Exam Constitutional:      General: He is not in acute distress.    Appearance: He is not diaphoretic.  HENT:     Head: Normocephalic and atraumatic.     Nose: Nose normal.     Mouth/Throat:     Pharynx: No oropharyngeal exudate.     Comments: thrush Eyes:     General: No scleral icterus.    Pupils: Pupils are equal, round, and reactive to light.  Neck:     Comments: Right cervical lymphadenopathy Cardiovascular:     Rate and Rhythm: Normal rate and regular rhythm.     Heart sounds: No murmur heard. Pulmonary:     Effort: Pulmonary effort is normal. No respiratory distress.     Breath sounds: No rales.  Chest:     Chest wall: No tenderness.  Abdominal:     General: There is no distension.     Palpations: Abdomen is soft.     Tenderness: There is no abdominal tenderness.     Comments: +PEG tube  Musculoskeletal:        General: Normal range of motion.     Cervical back: Normal range of motion and neck supple.     Comments:  Decreased right upper extremity strength   Lymphadenopathy:     Cervical: Cervical adenopathy present.  Skin:    General: Skin is warm and dry.     Findings: No erythema.  Neurological:     Mental Status: He is alert and oriented to person, place, and time.     Cranial Nerves: No cranial nerve deficit.     Motor: No abnormal muscle tone.     Coordination: Coordination normal.  Psychiatric:        Mood and Affect: Affect normal.     LABORATORY DATA:  I have reviewed  the data as listed Lab Results  Component Value Date   WBC 12.3 (H) 10/12/2021   HGB 10.2 (L) 10/12/2021   HCT 31.1 (L) 10/12/2021   MCV 94.5 10/12/2021   PLT 326 10/12/2021   Recent Labs    09/14/21 1332 10/05/21 0817 10/12/21 0824  NA 129* 133* 129*  K 4.7 4.2 4.5  CL 89* 94* 90*  CO2 31 32 31  GLUCOSE 128* 155* 103*  BUN 25* 23 25*  CREATININE 0.94 0.82 0.87  CALCIUM 8.2* 8.7* 8.5*  GFRNONAA >60 >60 >60  PROT 6.2* 5.8* 5.9*  ALBUMIN 3.1* 3.0* 3.0*  AST '16 20 22   ' ALT '27 21 23  ' ALKPHOS 64 58 54  BILITOT 0.3 0.6 0.4   RADIOGRAPHIC STUDIES: I have personally reviewed the radiological images as listed and agreed with the findings in the report. CT ANGIO HEAD NECK W WO CM  Result Date: 08/12/2021 CLINICAL DATA:  Follow-up examination for acute stroke, possible dural sinus thrombosis. EXAM: CT ANGIOGRAPHY HEAD AND NECK CT VENOGRAM HEAD TECHNIQUE: Multidetector CT imaging of the head and neck was performed using the standard protocol during bolus administration of intravenous contrast. Multiplanar CT image reconstructions and MIPs were obtained to evaluate the vascular anatomy. Carotid stenosis measurements (when applicable) are obtained utilizing NASCET criteria, using the distal internal carotid diameter as the denominator. Dedicated CT imaging of the dural venous sinuses using the standard protocol following the administration of IV contrast. CONTRAST:  47m OMNIPAQUE IOHEXOL 350 MG/ML SOLN COMPARISON:  Comparison made with previous MRI from 08/10/2021. FINDINGS: CT HEAD FINDINGS Brain: Cerebral volume within normal limits. Previously identified punctate right frontal infarcts not visible by CT. No other acute large vessel territory infarct. No intracranial hemorrhage. No mass lesion, mass effect or midline shift. No hydrocephalus or extra-axial fluid collection. Vascular: No hyperdense vessel. Skull: Scalp soft tissues and calvarium within normal limits. Sinuses: Scattered mucosal thickening within the ethmoidal air cells and maxillary sinuses. Trace chronic right mastoid effusion. Orbits: Globes and orbital soft tissues within normal limits. Review of the MIP images confirms the above findings CTA NECK FINDINGS Aortic arch: Visualized aortic arch normal in caliber with normal 3 vessel morphology. Minor for age atheromatous change along the undersurface of the aortic arch. No stenosis about the origin of the great vessels. Right carotid system: Right CCA widely patent  without stenosis. Ill-defined soft tissue density partially surrounding the proximal and mid right CCA consistent with post treatment changes. Mild for age atheromatous change about the right carotid bulb and proximal right ICA without significant stenosis. Right ICA widely patent distally without stenosis dissection or occlusion. Left carotid system: Left CCA patent from its origin to the bifurcation without stenosis. Mild for age eccentric mixed plaque at the proximal left ICA without significant stenosis. Left ICA patent distally without stenosis, dissection or occlusion. Vertebral arteries: Both vertebral arteries arise from the subclavian arteries. No proximal subclavian artery stenosis. Similarly, ill-defined soft tissue partially surrounding the proximal right vertebral artery consistent with post treatment changes. Vertebral arteries widely patent without stenosis, dissection or occlusion. Skeleton: No visible acute osseous finding. No discrete or worrisome osseous lesions. Torus mandibularis noted. Other neck: Post treatment changes seen within the mid and lower right neck. Mucosal edema within the right greater than left glottic and supraglottic larynx likely reflects post radiation changes. Layering secretions noted within the hypopharynx. Scattered soft tissue stranding within the right anterolateral neck and retropharyngeal space also consistent with post radiation changes. Patient's known ill-defined  nodal conglomerate just to the right of the esophagus at the thoracic inlet again seen, somewhat difficult to measure, but grossly similar from previous. Possible invasion of the adjacent esophagus which demonstrates irregular ill-defined wall thickening again noted. Changes are relatively similar to prior neck CT from 04/01/2021. Upper chest: Right greater than left layering pleural effusions partially visualized. Associated atelectasis and/or consolidation at the partially visualized right posterior  lung. 1.7 cm nodule present at the posterior right upper lobe, measuring slightly larger as compared to recent chest CT from 08/08/2021. Underlying emphysematous changes noted. 1.6 cm precarinal lymph node noted, indeterminate (series 9, image 1). Review of the MIP images confirms the above findings CTA HEAD FINDINGS Anterior circulation: Both internal carotid arteries widely patent to the termini without stenosis. 6 mm focal outpouching arising from the cavernous left ICA consistent with aneurysm (series 11, image 252). This projects laterally. A1 segments patent bilaterally. Normal anterior communicating artery complex. Anterior cerebral arteries patent to their distal aspects without stenosis. No M1 stenosis or occlusion. Normal MCA bifurcations. Distal MCA branches well perfused and symmetric. Posterior circulation: Both V4 segments patent to the vertebrobasilar junction without stenosis. Both PICA origins patent and normal. Basilar widely patent to its distal aspect without stenosis. Superior cerebellar arteries patent bilaterally. Both PCAs primarily supplied via the basilar and are well perfused to there distal aspects. Venous sinuses: Additional delayed venous phase images were performed. Normal enhancement seen throughout the superior sagittal sinus to the level of the torcula. Left transverse and sigmoid sinus is patent as is the visualized left internal jugular vein. Dominant right transverse and sigmoid sinuses demonstrate heterogeneous early enhancement, but are seen to fill and opacify with additional delayed images, consistent with slow but patent flow. Straight sinus, vein of Galen, internal cerebral veins, and basal veins of Rosenthal are patent. Cavernous sinus appears patent without abnormality. Superior orbital veins grossly symmetric and normal. No appreciable cortical vein thrombosis. Anatomic variants: None significant. Review of the MIP images confirms the above findings IMPRESSION: CT HEAD  IMPRESSION: Stable head CT. 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 No other acute intracranial abnormality. CTA HEAD AND NECK IMPRESSION: 1. Negative CTA for large vessel occlusion. Mild atheromatous disease for age, with no hemodynamically significant or correctable stenosis. 2. 6 mm cavernous left ICA aneurysm. 3. Post treatment changes within the lower neck with similar size and appearance of ill-defined right paraesophageal nodal mass. Possible invasion of the adjacent esophagus again noted. 4. Right greater than left layering pleural effusions with associated atelectasis and/or consolidation. 5. 1.7 cm right upper lobe nodule, consistent with metastatic disease. This measures slightly larger in size as compared to recent chest CT from 08/08/2021. 6. 1.6 cm precarinal lymph node, indeterminate. Attention at follow-up recommended. CT VENOGRAM IMPRESSION: 1. No evidence for dural venous sinus thrombosis. 2. Evidence for patent but slow flow within the dominant right transverse and sigmoid sinuses, corresponding with the asymmetric FLAIR signal intensity seen on prior MRI. Electronically Signed   By: Jeannine Boga M.D.   On: 08/12/2021 02:12   DG Wrist 2 Views Right  Result Date: 08/10/2021 CLINICAL DATA:  Hand and wrist soreness and swelling EXAM: RIGHT WRIST - 2 VIEW; RIGHT HAND - 2 VIEW COMPARISON:  None. FINDINGS: Wrist: There is no acute fracture or dislocation. There is minimal positive ulnar variance. Alignment is otherwise normal. The joint spaces are preserved. The soft tissues are unremarkable. Hand: There is no acute fracture or dislocation. Alignment is normal.  There is degenerative change at the thumb MCP joint. The joint spaces are otherwise preserved. The soft tissues are unremarkable. IMPRESSION: Mild degenerative change at the thumb MCP joint. Otherwise, unremarkable hand and wrist radiographs. Electronically Signed   By: Valetta Mole M.D.   On:  08/10/2021 13:33   CT HEAD WO CONTRAST (5MM)  Result Date: 08/08/2021 CLINICAL DATA:  Found on floor by family. Personal history of esophageal cancer. Feeding tube. Mental status change. Hypoxia. EXAM: CT HEAD WITHOUT CONTRAST CT CERVICAL SPINE WITHOUT CONTRAST TECHNIQUE: Multidetector CT imaging of the head and cervical spine was performed following the standard protocol without intravenous contrast. Multiplanar CT image reconstructions of the cervical spine were also generated. COMPARISON:  MRI had without and with contrast 04/10/2021 FINDINGS: CT HEAD FINDINGS Brain: No acute infarct, hemorrhage, or mass lesion is present. No significant white matter lesions are present. The ventricles are of normal size. No significant extraaxial fluid collection is present. The brainstem and cerebellum are within normal limits. Vascular: No hyperdense vessel or unexpected calcification. Skull: Right supraorbital soft tissue swelling and hematoma is present. No underlying fracture is present. Calvarium otherwise within normal limits. No other significant extracranial soft tissue injury is present. Sinuses/Orbits: Mild mucosal thickening is present in the maxillary sinuses bilaterally. No air-fluid levels are present. The globes and orbits are within normal limits. CT CERVICAL SPINE FINDINGS Alignment: No significant listhesis is present. Cervical lordosis preserved. Skull base and vertebrae: Craniocervical junction is within normal limits. Vertebral body heights are normal. Narrow acute or healing fractures are present. Soft tissues and spinal canal: No prevertebral fluid or swelling. No visible canal hematoma. Esophageal mass the thoracic inlet again noted. Edema along the right vocal cord improved. Right IJ Port-A-Cath in place. Disc levels: Uncovertebral spurring leads to moderate foraminal narrowing at C3-4 and C5-6, left greater than right. Upper chest: Lung apices are clear. IMPRESSION: 1. Right supraorbital soft tissue  swelling and hematoma without underlying fracture. 2. Normal CT appearance of the brain. 3. No acute fracture or traumatic subluxation in the cervical spine. 4. Multilevel degenerative changes of the cervical spine as described. Electronically Signed   By: San Morelle M.D.   On: 08/08/2021 13:54   CT SOFT TISSUE NECK W CONTRAST  Result Date: 10/12/2021 CLINICAL DATA:  Head neck cancer. 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 Right paratracheal adenopathy with potential esophagus invasion. EXAM: CT NECK WITH CONTRAST TECHNIQUE: Multidetector CT imaging of the neck was performed using the standard protocol following the bolus administration of intravenous contrast. CONTRAST:  80m OMNIPAQUE IOHEXOL 300 MG/ML  SOLN COMPARISON:  None. FINDINGS: Pharynx and larynx: Ill-defined mass at the cervical esophagus, right eccentric with broad defect in the right lateral wall contiguous with a gas collection that measures up to 2 cm. Regional ill-defined soft tissue density affecting the prevertebral space and extending rightward along the brachial plexus, indistinguishable from the right vertebral artery in from the posterior wall of the common carotid. No aneurysm of these vessels. 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 Salivary glands: No acute finding.  Symmetric atrophy. Thyroid: Symmetric atrophy Lymph nodes: None enlarged or abnormal density. Vascular: Negative Limited intracranial: Negative Visualized orbits: Negative Mastoids and visualized paranasal sinuses: Mucosal thickening with secretions especially in the right maxillary sinus Skeleton: No detected bony erosion/lesion. Upper chest: 17 mm nodule in the right upper lobe, increased from chest CT 08/08/2021. Heterogeneous right paratracheal node measuring 15 mm. These results will be called to the ordering clinician or  representative by the Radiologist Assistant, and communication documented in the PACS or [employer]. IMPRESSION: 1. Necrotic mass at the right-sided thoracic inlet with progressive involvement of the esophagus where there is a broad defect in the right lateral wall causing adjacent 2 cm gas cavity, a contained perforation. There is ill-defined contiguous soft tissue in the prevertebral space and encroaching on the proximal right brachial plexus which could be infiltrating tumor. 2. Right paratracheal adenopathy and right upper lobe metastasis which has enlarged from 08/08/2021. 3. Right vocal cord paralysis. Electronically Signed   By: Jorje Guild M.D.   On: 10/12/2021 08:32   CT Chest W Contrast  Result Date: 10/11/2021 CLINICAL DATA:  Head neck cancer EXAM: CT CHEST WITH CONTRAST TECHNIQUE: Multidetector CT imaging of the chest was performed during intravenous contrast administration. CONTRAST:  21m OMNIPAQUE IOHEXOL 300 MG/ML  SOLN COMPARISON:  CT chest, abdomen and pelvis dated September 5th 2022; chest CT dated April 01, 2021 FINDINGS: Cardiovascular: Normal heart size. Trace pericardial fluid. Coronary artery calcifications of the LAD and RCA. Mild atherosclerotic disease of the thoracic aorta. Mediastinum/Nodes: Previously seen retrotracheal tumor demonstrates increased cavitation. Correlate with separately dictated neck CT for further discussion of neck findings. Stable right upper paratracheal lymph node measuring 1.1 cm on series 2, image 17. No new or enlarging mediastinal lymph nodes. Prominent left axillary lymph nodes are no longer seen and were likely reactive. Lungs/Pleura: Central airways are patent. Near complete resolution previously seen of atelectasis and airspace consolidations. Interval increased size of right upper lobe pulmonary nodules. Reference right upper lobe pulmonary nodule measuring 1.7 x 1.7 cm on series 3, image 20, previously 1.3 x 1.3 cm. Solid mass of the inferior right upper lobe on image 29 measures 3.4 x 2.9 cm, previously 2.9 x 2.4 cm. Mass of the right  lower lobe was obscured on prior exam but is markedly increased in size when compared with more remote prior chest CT dated April 01, 2021 and measures 4.9 x 3.5 cm, previously measured up to 1.2 cm. Upper Abdomen: New lesion of the left hepatic lobe measuring 1.7 x 1.3 cm on series 2, image 51. Unchanged simple cyst of the right kidney. Additional low-attenuation lesion of the left lobe of the liver on image 49, unchanged compared to prior and likely a simple cyst. Musculoskeletal: Old left-sided rib fractures. No aggressive osseous lesions. IMPRESSION: 1. Interval increased size of right upper lobe pulmonary nodules. Right lower lobe pulmonary mass was obscured on prior exam due to atelectasis and consolidations, but is markedly increased in size when compared with more remote prior chest CT dated April 01, 2021. 2. Previously seen retrotracheal tumor demonstrates new central necrosis. Correlate with separately dictated neck CT report for further discussion of neck findings. 3. New lesion of the left hepatic lobe, concerning for hepatic metastatic disease. 4. Prominent left axillary lymph nodes are no longer seen and were likely reactive. Electronically Signed   By: LYetta GlassmanM.D.   On: 10/11/2021 16:03   CT Angio Chest PE W and/or Wo Contrast  Result Date: 08/08/2021 CLINICAL DATA:  History metastatic or pharyngeal squamous carcinoma. Found on floor at home and hypoxic. Elevated white blood cell count, D-dimer and lipase. EXAM: CT ANGIOGRAPHY CHEST WITH CONTRAST TECHNIQUE: Multidetector CT imaging of the chest was performed using the standard protocol during bolus administration of intravenous contrast. Multiplanar CT image reconstructions and MIPs were obtained to evaluate the vascular anatomy. CONTRAST:  623mOMNIPAQUE IOHEXOL 350 MG/ML SOLN COMPARISON:  Prior CT of the chest on 04/01/2021 FINDINGS: Cardiovascular: The pulmonary arteries are adequately opacified. There is no evidence of pulmonary  embolism. Central pulmonary arteries are normal in caliber. The thoracic aorta is normal in caliber. Stable heart size. Stable calcified coronary artery plaque. 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 No evidence of mediastinal or hilar lymphadenopathy. Increased prominence of some adjacent superior left axillary lymph nodes with the largest measuring approximately 1 cm in short axis. Lungs/Pleura: New extensive airspace consolidation and volume loss of the right lower lobe and right middle lobe with visible occlusive debris in the bronchus intermedius and scattered small air bronchograms in consolidated lung. Visible nearly occlusive debris in the left mainstem bronchus and debris in the left lower lobe bronchus with near complete atelectasis of the left lower lobe. Focal airspace disease in the medial aspect of the right middle lobe and posterior left upper lobe. Pulmonary findings are consistent with bilateral pneumonia and highly suggestive of aspiration pneumonia given the extensive debris in the airways. No significant associated pleural fluid or evidence of pneumothorax. Known metastatic pulmonary nodule in the superior right upper lobe shows interval enlargement since the prior study in April. This previously measured 8 mm and now measures 13 x 13 mm. Metastatic mass in the inferior aspect of the right upper lobe previously measuring 1.2 x 1.3 cm shows enlargement and now measures approximately 2.8 x 2.9 cm. Previously noted right lower lobe pulmonary nodule is not visible on today study due to the extensive consolidation and volume loss of the right lower lobe. Musculoskeletal: No chest wall abnormality. No acute or significant osseous findings. Review of the MIP images confirms the above findings. IMPRESSION: 1. No evidence of pulmonary embolism. 2. New extensive airspace  consolidation and volume loss of the right lower lobe, right middle lobe and left lower lobe with significant debris in the right bronchus intermedius, left mainstem bronchus and left lower lobe bronchus. Additional airspace disease in the right middle lobe and left upper lobe. Findings are suggestive of bilateral aspiration pneumonia. 3. Progression of metastatic disease in the right lung with enlargement of superior right upper lobe and inferior right upper lobe nodules as noted above. The previously noted right lower lobe nodule cannot be assessed as it is obscured by consolidation of the right lower lobe. 4. 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 5. Increased prominence of left axillary lymph nodes which only measure 1 cm in short axis. Electronically Signed   By: Aletta Edouard M.D.   On: 08/08/2021 14:11   CT Cervical Spine Wo Contrast  Result Date: 08/08/2021 CLINICAL DATA:  Found on floor by family. Personal history of esophageal cancer. Feeding tube. Mental status change. Hypoxia. EXAM: CT HEAD WITHOUT CONTRAST CT CERVICAL SPINE WITHOUT CONTRAST TECHNIQUE: Multidetector CT imaging of the head and cervical spine was performed following the standard protocol without intravenous contrast. Multiplanar CT image reconstructions of the cervical spine were also generated. COMPARISON:  MRI had without and with contrast 04/10/2021 FINDINGS: CT HEAD FINDINGS Brain: No acute infarct, hemorrhage, or mass lesion is present. No significant white matter lesions are present. The ventricles are of normal size. No significant extraaxial fluid collection is present. The brainstem and cerebellum are within normal limits. Vascular: No hyperdense vessel or unexpected calcification. Skull: Right supraorbital soft tissue swelling and hematoma is present. No underlying fracture  is present. Calvarium otherwise within normal limits. No other significant extracranial soft tissue injury is  present. Sinuses/Orbits: Mild mucosal thickening is present in the maxillary sinuses bilaterally. No air-fluid levels are present. The globes and orbits are within normal limits. CT CERVICAL SPINE FINDINGS Alignment: No significant listhesis is present. Cervical lordosis preserved. Skull base and vertebrae: Craniocervical junction is within normal limits. Vertebral body heights are normal. Narrow acute or healing fractures are present. Soft tissues and spinal canal: No prevertebral fluid or swelling. No visible canal hematoma. Esophageal mass the thoracic inlet again noted. Edema along the right vocal cord improved. Right IJ Port-A-Cath in place. Disc levels: Uncovertebral spurring leads to moderate foraminal narrowing at C3-4 and C5-6, left greater than right. Upper chest: Lung apices are clear. IMPRESSION: 1. Right supraorbital soft tissue swelling and hematoma without underlying fracture. 2. Normal CT appearance of the brain. 3. No acute fracture or traumatic subluxation in the cervical spine. 4. Multilevel degenerative changes of the cervical spine as described. Electronically Signed   By: San Morelle M.D.   On: 08/08/2021 13:54   MR BRAIN WO CONTRAST  Result Date: 08/11/2021 CLINICAL DATA:  Initial evaluation for neuro deficit, stroke suspected, cervical radiculopathy. EXAM: MRI HEAD WITHOUT CONTRAST TECHNIQUE: Multiplanar, multiecho pulse sequences of the brain and surrounding structures were obtained without intravenous contrast. COMPARISON:  Prior study from 08/08/2021. FINDINGS: MRI HEAD FINDINGS Brain: Cerebral volume within normal limits. No significant cerebral white matter disease for age. There are 2 subtle adjacent punctate foci of diffusion abnormality involving the subcortical aspect of the posterior right frontal region (series 5, images 36, 35), suspicious for tiny acute to early subacute ischemic infarcts. No associated hemorrhage or mass effect. No other diffusion abnormality to  suggest acute or subacute ischemia. Gray-white matter differentiation otherwise maintained. No encephalomalacia to suggest chronic cortical infarction elsewhere within the brain. No other evidence for acute or chronic intracranial hemorrhage. No mass lesion, midline shift or mass effect. No hydrocephalus or extra-axial fluid collection. Pituitary gland and suprasellar region within normal limits. Midline structures intact. Vascular: There is asymmetric FLAIR signal with intermediate T1 signal intensity involving the right transverse and sigmoid sinus (series 15, image 14). While this finding is favored to reflect slow/sluggish flow, possible thrombus is difficult to exclude. Major intracranial vascular flow voids are otherwise maintained. Skull and upper cervical spine: Craniocervical junction within normal limits. Bone marrow signal intensity normal. No focal marrow replacing lesion. No scalp soft tissue abnormality. Sinuses/Orbits: Globes and orbital soft tissues within normal limits. Mild scattered mucosal thickening noted within the ethmoidal air cells and maxillary sinuses. Paranasal sinuses are otherwise clear. Small right mastoid effusion noted, of doubtful significance. Inner ear structures grossly normal. Other: None. IMPRESSION: 1. Two subtle adjacent punctate foci of diffusion abnormality involving the subcortical aspect of the posterior right frontal region, suspicious for tiny acute to early subacute ischemic infarcts. No associated hemorrhage or mass effect. 2. Asymmetric FLAIR signal intensity involving the right transverse and sigmoid sinus. While this finding is favored to reflect slow/sluggish flow, possible thrombus is difficult to exclude. Further assessment with dedicated MRV, with and without contrast, suggested for further evaluation. 3. Otherwise normal brain MRI for age. Electronically Signed   By: Jeannine Boga M.D.   On: 08/11/2021 00:53   MR CHEST W WO CONTRAST  Result Date:  08/12/2021 CLINICAL DATA:  Progressive right upper extremity weakness. 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 EXAM: MR CHEST WITH AND WITHOUT CONTRAST TECHNIQUE: Multiplanar,  multisequence MR imaging of the right brachial plexus was performed before and after the administration of intravenous contrast. CONTRAST:  7.95m GADAVIST GADOBUTROL 1 MMOL/ML IV SOLN COMPARISON:  CTA head and neck from yesterday. FINDINGS: Spinal cord Normal caliber and signal of visualized spinal cord. Brachial plexus There is diffusely increased T2 signal and mild enhancement involving the right brachial plexus roots, trunks, divisions, cords, and branches. Muscles and tendons Incompletely evaluated mild edema involving the right rotator cuff muscles. Bones Please see separate MRI cervical spine reports from today and yesterday. Other findings Unchanged moderate right pleural effusion. Similar right paraesophageal nodal conglomerate at the thoracic inlet. Edematous glottic and supraglottic larynx consistent with post radiation change. Similar layering secretions in the hypopharynx. Non-enhancing soft tissue thickening surrounding the right common carotid artery, also consistent with post treatment change. IMPRESSION: 1. 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 2. Similar right paraesophageal nodal conglomerate at the thoracic inlet. Similar post treatment changes in the larynx and surrounding the right common carotid artery. 3. Unchanged moderate right pleural effusion. Electronically Signed   By: WTitus DubinM.D.   On: 08/12/2021 20:15   MR CERVICAL SPINE WO CONTRAST  Result Date: 08/11/2021 CLINICAL DATA:  Initial evaluation for cervical radiculopathy. History of oropharyngeal squamous cell carcinoma. EXAM: MRI CERVICAL SPINE WITHOUT CONTRAST TECHNIQUE: Multiplanar, multisequence MR imaging of the cervical spine was performed. No intravenous contrast was administered. COMPARISON:   Prior CT of the neck from 01/05/2021. FINDINGS: Alignment: Straightening of the normal cervical lordosis. No listhesis. Vertebrae: Vertebral body height maintained without acute or chronic fracture. Suspected postradiation changes with possible fatty marrow conversion within the visualized osseous structures. Mild marrow edema involving the anterior aspects of the C4 through C7 vertebral bodies could be post treatment related as well. No discrete or worrisome osseous lesions. Facet arthrosis with associated marrow edema present about the left C3-4 facet, which could contribute to neck pain. Cord: Normal signal and morphology. Posterior Fossa, vertebral arteries, paraspinal tissues: Craniocervical junction within normal limits. Irregular appearance of the visualized hypopharynx with layering retropharyngeal effusion and right paraesophageal nodal conglomerate, grossly similar to prior neck CT. Normal flow voids seen within the vertebral arteries bilaterally. Disc levels: C2-C3: Mild left eccentric disc bulge with left-sided uncinate spurring. Mild left-sided facet hypertrophy. No spinal stenosis. Foramina remain patent. C3-C4: Broad-based posterior disc osteophyte complex flattens and partially effaces the ventral thecal sac, asymmetric to the left. Mild left-sided spinal stenosis without cord impingement. Superimposed moderate left-sided facet degeneration. Moderate left with mild right C4 foraminal narrowing. C4-C5: Mild disc bulge with uncovertebral spurring. Mild left greater than right facet hypertrophy. No spinal stenosis. Mild bilateral C5 foraminal narrowing. C5-C6: Broad-based disc osteophyte complex flattens and partially effaces the ventral thecal sac, asymmetric to the right. Mild spinal stenosis with mild flattening of the ventral cord, but no cord signal changes. Severe right worse than left C6 foraminal narrowing. C6-C7: Minimal disc bulge with bilateral uncovertebral spurring. No spinal stenosis.  Superimposed mild left-sided facet hypertrophy. Mild bilateral C7 foraminal narrowing. C7-T1: Negative interspace. Mild left-sided facet hypertrophy. No stenosis. Visualized upper thoracic spine demonstrates no significant finding. IMPRESSION: 1. Degenerative disc osteophyte at C5-6 with resultant mild spinal stenosis, with severe bilateral C6 foraminal narrowing. 2. Left eccentric disc osteophyte and facet arthrosis at C3-4 with resultant mild left-sided spinal stenosis, with moderate left C4 foraminal narrowing. 3. More mild multilevel spondylosis elsewhere within the cervical spine as above. No other significant stenosis or neural impingement. 4. Post treatment changes  within the visualized neck with associated right paraesophageal nodal conglomerate, partially visualized, but grossly similar to previous neck CT from 01/05/2021. Electronically Signed   By: Jeannine Boga M.D.   On: 08/11/2021 01:10   MR CERVICAL SPINE W CONTRAST  Result Date: 08/12/2021 CLINICAL DATA:  Head and neck cancer, known C-spine involvement EXAM: MRI CERVICAL SPINE WITH CONTRAST TECHNIQUE: Multiplanar, multisequence MR imaging of the cervical spine was performed following the administration of intravenous contrast. COMPARISON:  08/10/2021 MRI cervical spine without contrast, 05/31/2021 MRI cervical spine with and without contrast. Correlation is also made with CTA head neck 08/11/2021 FINDINGS: Alignment: Physiologic. Vertebrae: Diffuse fatty replacement of the bone marrow secondary to prior radiation in the neck. Redemonstrated small areas of enhancement at the anterior aspect of C5, C6, C7, and T1, which appear overall similar to the prior exam. No acute fracture. Cord: No abnormal enhancement. Posterior Fossa, vertebral arteries, paraspinal tissues: Mucosal thickening and enhancement in the esophagus, with a nonenhancing fluid collection at the right aspect. Posterior fossa is unremarkable. Disc levels: Please see previous  MRI of the cervical spine 08/10/2021. IMPRESSION: Redemonstrated small areas of enhancement at the anterior aspects of C5, C6, C7, and T1, which appear overall similar to the prior exam. No new foci of abnormal osseous enhancement. No evidence of cord enhancement. Electronically Signed   By: Merilyn Baba M.D.   On: 08/12/2021 17:19   CT ABDOMEN PELVIS W CONTRAST  Result Date: 08/08/2021 CLINICAL DATA:  History of metastatic oropharyngeal squamous carcinoma. Found on floor at home and hypoxic. Elevated white blood cell count, D-dimer and lipase. EXAM: CT ABDOMEN AND PELVIS WITH CONTRAST TECHNIQUE: Multidetector CT imaging of the abdomen and pelvis was performed using the standard protocol following bolus administration of intravenous contrast. CONTRAST:  70m OMNIPAQUE IOHEXOL 350 MG/ML SOLN COMPARISON:  CT of the abdomen without contrast on 09/15/2020 and PET scan on 11/09/2020 FINDINGS: Hepatobiliary: Stable left lobe hepatic cyst. Gallbladder and bile ducts are unremarkable. Pancreas: Unremarkable. No pancreatic ductal dilatation or surrounding inflammatory changes. Spleen: Normal in size without focal abnormality. Adrenals/Urinary Tract: Stable right renal cyst. No hydronephrosis or calculi. No solid renal masses or adrenal masses. The bladder is unremarkable. Stomach/Bowel: Gastrostomy tube present with normal positioning within the lumen of the stomach. No evidence of bowel obstruction, ileus or inflammation. No mass lesions are seen involving the bowel. Diverticulosis of the descending and sigmoid colon without evidence of diverticulitis. No free intraperitoneal air. Vascular/Lymphatic: No significant vascular findings are present. No enlarged abdominal or pelvic lymph nodes. Reproductive: Prostate is unremarkable. Other: No abdominal wall hernia or abnormality. No abdominopelvic ascites. Musculoskeletal: No acute or significant osseous findings. IMPRESSION: No acute findings in the abdomen or pelvis.  Gastrostomy tube is present and in appropriate position. Electronically Signed   By: GAletta EdouardM.D.   On: 08/08/2021 14:22   DG Hand 2 View Right  Result Date: 08/10/2021 CLINICAL DATA:  Hand and wrist soreness and swelling EXAM: RIGHT WRIST - 2 VIEW; RIGHT HAND - 2 VIEW COMPARISON:  None. FINDINGS: Wrist: There is no acute fracture or dislocation. There is minimal positive ulnar variance. Alignment is otherwise normal. The joint spaces are preserved. The soft tissues are unremarkable. Hand: There is no acute fracture or dislocation. Alignment is normal. There is degenerative change at the thumb MCP joint. The joint spaces are otherwise preserved. The soft tissues are unremarkable. IMPRESSION: Mild degenerative change at the thumb MCP joint. Otherwise, unremarkable hand and wrist radiographs. Electronically Signed  By: Valetta Mole M.D.   On: 08/10/2021 13:33   IR REPLACE G-TUBE SIMPLE WO FLUORO  Result Date: 09/16/2021 INDICATION: Head neck cancer.  G-tube dependent, malfunctioning. EXAM: BEDSIDE EXCHANGE OF GASTROSTOMY TUBE COMPARISON:  CT chest, 08/11/2021.  IR fluoroscopy 09/24/2020. MEDICATIONS: None. CONTRAST:  None FLUOROSCOPY TIME:  None COMPLICATIONS: None immediate. PROCEDURE: A time-out was performed prior to the initiation of the procedure. The indwelling tube and skin surrounding it were prepped and draped using sterile technique. Using manual traction, the existing pull-through gastrostomy tube was removed. The tube structure was degraded and the mushroom tip detached and remain within stomach. A new 16-F MIC G-tube was then inserted through the existing tract. The balloon was inflated with sterile water. The tube was pulled back until the retention balloon was positioned against the anterior stomach wall and the external disc was pushed flush against the skin, securing the tube in place. Gastric contents were aspirated confirming position within the stomach. A dressing was placed. The  patient tolerated the procedure well without immediate postprocedural complication. IMPRESSION: 1. Bedside exchange of pull-through gastrostomy tube, with a 20 French balloon-inflatable MIC G-tube. 2. Degraded gastrostomy tube resulting in mushroom tip detachment. This will likely be retained in stomach or may pass in stool, if it transits the pylorus and IC valves. The patient was made aware of the finding. Michaelle Birks, MD [facility] Electronically Signed   By: Michaelle Birks M.D.   On: 09/16/2021 15:37   DG Chest Portable 1 View  Result Date: 08/08/2021 CLINICAL DATA:  Low O2 saturation EXAM: PORTABLE CHEST 1 VIEW COMPARISON:  None FINDINGS: There is a chest port with catheter tip overlying the right atrium. Unchanged, enlarged cardiac silhouette. There are low lung volumes with bibasilar atelectasis and possible small effusions. Nodular opacities in the right lung consistent with known pulmonary nodules. No new focal airspace disease. No visible pneumothorax. Bilateral shoulder degenerative changes. No acute osseous abnormality. Thoracic spondylosis. IMPRESSION: Low lung volumes with bibasilar subsegmental atelectasis and probable small bilateral pleural effusions. Electronically Signed   By: Maurine Simmering M.D.   On: 08/08/2021 09:54   ECHOCARDIOGRAM COMPLETE  Result Date: 08/12/2021    ECHOCARDIOGRAM REPORT   Patient Name:   Ivan Carne [facility] Date of Exam: 08/12/2021 Medical Rec #:  [id number]              Height:       70.0 in Accession #:    [id number]             Weight:       189.2 lb Date of Birth:  [birth year]              BSA:          2.038 m Patient Age:    6 years               BP:           148/100 mmHg Patient Gender: M                      HR:           102 bpm. Exam Location:  [facility] Procedure: 2D Echo, Color Doppler and Cardiac Doppler Indications:     I63.9 Stroke  History:         Patient has no prior history of Echocardiogram  examinations.  Risk Factors:Hypertension and Dyslipidemia.  Sonographer:     Charmayne Sheer Referring Phys:  [id number] AMRIT ADHIKARI Diagnosing Phys: Yolonda Kida MD  Sonographer Comments: Technically challenging study due to limited acoustic windows. Image acquisition challenging due to patient body habitus and Image acquisition challenging due to respiratory motion. IMPRESSIONS  1. Left ventricular ejection fraction, by estimation, is 40 to 45%. The left ventricle has mildly decreased function. The left ventricle demonstrates global hypokinesis. Left ventricular diastolic parameters are consistent with Grade II diastolic dysfunction (pseudonormalization).  2. Right ventricular systolic function is normal. The right ventricular size is moderately enlarged.  3. The mitral valve is normal in structure. Trivial mitral valve regurgitation.  4. The aortic valve is grossly normal. Aortic valve regurgitation is not visualized. FINDINGS  Left Ventricle: Left ventricular ejection fraction, by estimation, is 40 to 45%. The left ventricle has mildly decreased function. The left ventricle demonstrates global hypokinesis. The left ventricular internal cavity size was normal in size. There is  no left ventricular hypertrophy. Left ventricular diastolic parameters are consistent with Grade II diastolic dysfunction (pseudonormalization). Right Ventricle: The right ventricular size is moderately enlarged. No increase in right ventricular wall thickness. Right ventricular systolic function is normal. Left Atrium: Left atrial size was normal in size. Right Atrium: Right atrial size was normal in size. Pericardium: There is no evidence of pericardial effusion. Mitral Valve: The mitral valve is normal in structure. Trivial mitral valve regurgitation. MV peak gradient, 3.9 mmHg. The mean mitral valve gradient is 2.0 mmHg. Tricuspid Valve: The tricuspid valve is grossly normal. Tricuspid valve regurgitation is mild.  Aortic Valve: The aortic valve is grossly normal. Aortic valve regurgitation is not visualized. Aortic valve mean gradient measures 2.0 mmHg. Aortic valve peak gradient measures 3.7 mmHg. Aortic valve area, by VTI measures 3.38 cm. Pulmonic Valve: The pulmonic valve was grossly normal. Pulmonic valve regurgitation is not visualized. Aorta: The ascending aorta was not well visualized. IAS/Shunts: No atrial level shunt detected by color flow Doppler.  LEFT VENTRICLE PLAX 2D LVIDd:         4.30 cm  Diastology LVIDs:         3.50 cm  LV e' medial:    5.22 cm/s LV PW:         1.00 cm  LV E/e' medial:  9.8 LV IVS:        0.70 cm  LV e' lateral:   7.72 cm/s LVOT diam:     2.20 cm  LV E/e' lateral: 6.7 LV SV:         52 LV SV Index:   25 LVOT Area:     3.80 cm  LEFT ATRIUM           Index LA diam:      3.10 cm 1.52 cm/m LA Vol (A4C): 25.8 ml 12.66 ml/m  AORTIC VALVE                   PULMONIC VALVE AV Area (Vmax):    3.35 cm    PV Vmax:       0.82 m/s AV Area (Vmean):   3.45 cm    PV Vmean:      56.400 cm/s AV Area (VTI):     3.38 cm    PV VTI:        0.101 m AV Vmax:           96.30 cm/s  PV Peak grad:  2.7 mmHg AV Vmean:  65.700 cm/s PV Mean grad:  1.0 mmHg AV VTI:            0.153 m AV Peak Grad:      3.7 mmHg AV Mean Grad:      2.0 mmHg LVOT Vmax:         84.90 cm/s LVOT Vmean:        59.700 cm/s LVOT VTI:          0.136 m LVOT/AV VTI ratio: 0.89  AORTA Ao Root diam: 3.70 cm MITRAL VALVE MV Area (PHT): 9.37 cm    SHUNTS MV Area VTI:   3.83 cm    Systemic VTI:  0.14 m MV Peak grad:  3.9 mmHg    Systemic Diam: 2.20 cm MV Mean grad:  2.0 mmHg MV Vmax:       0.99 m/s MV Vmean:      58.9 cm/s MV Decel Time: 81 msec MV E velocity: 51.40 cm/s MV A velocity: 75.80 cm/s MV E/A ratio:  0.68 Dwayne D Callwood MD Electronically signed by Yolonda Kida MD Signature Date/Time: 08/12/2021/4:11:53 PM    Final    CT VENOGRAM HEAD  Result Date: 08/12/2021 CLINICAL DATA:  Follow-up examination for acute stroke,  possible dural sinus thrombosis. EXAM: CT ANGIOGRAPHY HEAD AND NECK CT VENOGRAM HEAD TECHNIQUE: Multidetector CT imaging of the head and neck was performed using the standard protocol during bolus administration of intravenous contrast. Multiplanar CT image reconstructions and MIPs were obtained to evaluate the vascular anatomy. Carotid stenosis measurements (when applicable) are obtained utilizing NASCET criteria, using the distal internal carotid diameter as the denominator. Dedicated CT imaging of the dural venous sinuses using the standard protocol following the administration of IV contrast. CONTRAST:  68m OMNIPAQUE IOHEXOL 350 MG/ML SOLN COMPARISON:  Comparison made with previous MRI from 08/10/2021. FINDINGS: CT HEAD FINDINGS Brain: Cerebral volume within normal limits. Previously identified punctate right frontal infarcts not visible by CT. No other acute large vessel territory infarct. No intracranial hemorrhage. No mass lesion, mass effect or midline shift. No hydrocephalus or extra-axial fluid collection. Vascular: No hyperdense vessel. Skull: Scalp soft tissues and calvarium within normal limits. Sinuses: Scattered mucosal thickening within the ethmoidal air cells and maxillary sinuses. Trace chronic right mastoid effusion. Orbits: Globes and orbital soft tissues within normal limits. Review of the MIP images confirms the above findings CTA NECK FINDINGS Aortic arch: Visualized aortic arch normal in caliber with normal 3 vessel morphology. Minor for age atheromatous change along the undersurface of the aortic arch. No stenosis about the origin of the great vessels. Right carotid system: Right CCA widely patent without stenosis. Ill-defined soft tissue density partially surrounding the proximal and mid right CCA consistent with post treatment changes. Mild for age atheromatous change about the right carotid bulb and proximal right ICA without significant stenosis. Right ICA widely patent distally without  stenosis dissection or occlusion. Left carotid system: Left CCA patent from its origin to the bifurcation without stenosis. Mild for age eccentric mixed plaque at the proximal left ICA without significant stenosis. Left ICA patent distally without stenosis, dissection or occlusion. Vertebral arteries: Both vertebral arteries arise from the subclavian arteries. No proximal subclavian artery stenosis. Similarly, ill-defined soft tissue partially surrounding the proximal right vertebral artery consistent with post treatment changes. Vertebral arteries widely patent without stenosis, dissection or occlusion. Skeleton: No visible acute osseous finding. No discrete or worrisome osseous lesions. Torus mandibularis noted. Other neck: Post treatment changes seen within the mid and lower right  neck. Mucosal edema within the right greater than left glottic and supraglottic larynx likely reflects post radiation changes. Layering secretions noted within the hypopharynx. Scattered soft tissue stranding within the right anterolateral neck and retropharyngeal space also consistent with post radiation changes. Patient's known ill-defined nodal conglomerate just to the right of the esophagus at the thoracic inlet again seen, somewhat difficult to measure, but grossly similar from previous. Possible invasion of the adjacent esophagus which demonstrates irregular ill-defined wall thickening again noted. Changes are relatively similar to prior neck CT from 04/01/2021. Upper chest: Right greater than left layering pleural effusions partially visualized. Associated atelectasis and/or consolidation at the partially visualized right posterior lung. 1.7 cm nodule present at the posterior right upper lobe, measuring slightly larger as compared to recent chest CT from 08/08/2021. Underlying emphysematous changes noted. 1.6 cm precarinal lymph node noted, indeterminate (series 9, image 1). Review of the MIP images confirms the above findings  CTA HEAD FINDINGS Anterior circulation: Both internal carotid arteries widely patent to the termini without stenosis. 6 mm focal outpouching arising from the cavernous left ICA consistent with aneurysm (series 11, image 252). This projects laterally. A1 segments patent bilaterally. Normal anterior communicating artery complex. Anterior cerebral arteries patent to their distal aspects without stenosis. No M1 stenosis or occlusion. Normal MCA bifurcations. Distal MCA branches well perfused and symmetric. Posterior circulation: Both V4 segments patent to the vertebrobasilar junction without stenosis. Both PICA origins patent and normal. Basilar widely patent to its distal aspect without stenosis. Superior cerebellar arteries patent bilaterally. Both PCAs primarily supplied via the basilar and are well perfused to there distal aspects. Venous sinuses: Additional delayed venous phase images were performed. Normal enhancement seen throughout the superior sagittal sinus to the level of the torcula. Left transverse and sigmoid sinus is patent as is the visualized left internal jugular vein. Dominant right transverse and sigmoid sinuses demonstrate heterogeneous early enhancement, but are seen to fill and opacify with additional delayed images, consistent with slow but patent flow. Straight sinus, vein of Galen, internal cerebral veins, and basal veins of Rosenthal are patent. Cavernous sinus appears patent without abnormality. Superior orbital veins grossly symmetric and normal. No appreciable cortical vein thrombosis. Anatomic variants: None significant. Review of the MIP images confirms the above findings IMPRESSION: CT HEAD IMPRESSION: Stable head CT. 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 No other acute intracranial abnormality. CTA HEAD AND NECK IMPRESSION: 1. Negative CTA for large vessel occlusion. Mild atheromatous disease for age, with no hemodynamically significant or  correctable stenosis. 2. 6 mm cavernous left ICA aneurysm. 3. Post treatment changes within the lower neck with similar size and appearance of ill-defined right paraesophageal nodal mass. Possible invasion of the adjacent esophagus again noted. 4. Right greater than left layering pleural effusions with associated atelectasis and/or consolidation. 5. 1.7 cm right upper lobe nodule, consistent with metastatic disease. This measures slightly larger in size as compared to recent chest CT from 08/08/2021. 6. 1.6 cm precarinal lymph node, indeterminate. Attention at follow-up recommended. CT VENOGRAM IMPRESSION: 1. No evidence for dural venous sinus thrombosis. 2. Evidence for patent but slow flow within the dominant right transverse and sigmoid sinuses, corresponding with the asymmetric FLAIR signal intensity seen on prior MRI. Electronically Signed   By: Jeannine Boga M.D.   On: 08/12/2021 02:12      ASSESSMENT & PLAN:   1. 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[place])   2. Goals of care, counseling/discussion   3. Right brachial  plexitis   4. Acquired hypothyroidism   5. 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 Status post weekly methotrexate x 1 Since patient has been off treatment until recently, I obtained CT chest and soft tissue with contrast to establish new baseline. 10/10/2021, CT neck soft tissue unfortunately showed necrotic mass at the right sided thoracic inlet with progressive involvement of the esophagus where there is a broad defect in the right lateral wall causing adjacent 2 cm Cavity, containing perforation.  Ill-defined contiguous soft tissue in the prevertebral space and encroaching the proximal right brachial plexus.  Right peritracheal adenopathy and right upper lobe metastasis progressed from 08/09/2019..  Right vocal cord paralysis. CT chest with contrast showed  interval increase size of lung masses.  Previously seen retrotracheal tumor demonstrates new central necrosis.  New lesion of the left hepatic lobe, concerning for hepatic metastatic disease  Given that he now has developed a contained perforation of esophagus, I will hold additional chemotherapy.  Discussed with ENT surgeon Dr. Richardson Landry who would defer intervention procedures to tertiary center due to the complexity of his case.  I also reached out to [facility] oncology Dr. Mariel Kansky who feels that a surgery consultation could be set up for patient.  However surgery would likely need to be very aggressive to get to a level of healthy tissue where there could be any wound healing.  His cancer was refractory to multiple lines of treatments.  Even if surgeons willing to operate, he would likely not recover in time to be eligible for systemic therapy, not that we have anything great to offer him.  I agree with Dr. Mariel Kansky that comfort care/hospice would be the best option for him.  I called Ivan Garcia after his encounter and updated him above recommendations.  We will set him up to have a discussion with palliative service [employer].  Patient agrees with the plan and thanks [place] for the care he has received at our cancer center.  #Thrush, persistent despite nystatin swish and spit.  I sent a prescription of fluconazole to his pharmacy and advised patient to start today.  # Dysphagia + PEG, continue tube feeding.  continue follow-up with nutritionist. 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

## 2021-10-12 NOTE — Progress Notes (Signed)
Patient here for oncology follow-up appointment, concerns of throat pain and coughing blood

## 2021-10-12 NOTE — Progress Notes (Signed)
No treatment today per MD. [facility], patient discharged with appointments. No questions at this time.

## 2021-10-12 NOTE — Telephone Encounter (Signed)
Called results  IMPRESSION: 1. Necrotic mass at the right-sided thoracic inlet with progressive involvement of the esophagus where there is a broad defect in the right lateral wall causing adjacent 2 cm gas cavity, a contained perforation. There is ill-defined contiguous soft tissue in the prevertebral space and encroaching on the proximal right brachial plexus which could be infiltrating tumor. 2. Right paratracheal adenopathy and right upper lobe metastasis which has enlarged from 08/08/2021. 3. Right vocal cord paralysis.     Electronically Signed   By: Jorje Guild M.D.   On: 10/12/2021 08:32

## 2021-10-12 NOTE — Telephone Encounter (Signed)
MD discussed with pt during today's visit

## 2021-10-13 ENCOUNTER — Emergency Department
Admission: EM | Admit: 2021-10-13 | Discharge: 2021-10-13 | Disposition: A | Discharge status: Home / Self Care | Payer: Medicare Other | Attending: Emergency Medicine | Admitting: Emergency Medicine

## 2021-10-13 ENCOUNTER — Emergency Department: Payer: Medicare Other

## 2021-10-13 ENCOUNTER — Other Ambulatory Visit: Payer: Self-pay

## 2021-10-13 ENCOUNTER — Emergency Department
Admission: EM | Admit: 2021-10-13 | Discharge: 2021-11-03 | Disposition: E | Payer: Medicare Other | Source: Home / Self Care | Attending: Emergency Medicine | Admitting: Emergency Medicine

## 2021-10-13 ENCOUNTER — Telehealth: Payer: Self-pay | Admitting: *Deleted

## 2021-10-13 DIAGNOSIS — Z20822 Contact with and (suspected) exposure to covid-19: Secondary | ICD-10-CM | POA: Diagnosis not present

## 2021-10-13 DIAGNOSIS — Z79899 Other long term (current) drug therapy: Secondary | ICD-10-CM | POA: Diagnosis not present

## 2021-10-13 DIAGNOSIS — Z7982 Long term (current) use of aspirin: Secondary | ICD-10-CM | POA: Insufficient documentation

## 2021-10-13 DIAGNOSIS — E039 Hypothyroidism, unspecified: Secondary | ICD-10-CM | POA: Insufficient documentation

## 2021-10-13 DIAGNOSIS — K922 Gastrointestinal hemorrhage, unspecified: Secondary | ICD-10-CM | POA: Insufficient documentation

## 2021-10-13 DIAGNOSIS — N1831 Chronic kidney disease, stage 3a: Secondary | ICD-10-CM | POA: Diagnosis not present

## 2021-10-13 DIAGNOSIS — Z85828 Personal history of other malignant neoplasm of skin: Secondary | ICD-10-CM | POA: Insufficient documentation

## 2021-10-13 DIAGNOSIS — R600 Localized edema: Secondary | ICD-10-CM | POA: Diagnosis not present

## 2021-10-13 DIAGNOSIS — Z7901 Long term (current) use of anticoagulants: Secondary | ICD-10-CM | POA: Insufficient documentation

## 2021-10-13 DIAGNOSIS — Z85818 Personal history of malignant neoplasm of other sites of lip, oral cavity, and pharynx: Secondary | ICD-10-CM | POA: Diagnosis not present

## 2021-10-13 DIAGNOSIS — I469 Cardiac arrest, cause unspecified: Secondary | ICD-10-CM | POA: Insufficient documentation

## 2021-10-13 DIAGNOSIS — Z794 Long term (current) use of insulin: Secondary | ICD-10-CM | POA: Diagnosis not present

## 2021-10-13 DIAGNOSIS — K920 Hematemesis: Secondary | ICD-10-CM | POA: Diagnosis present

## 2021-10-13 DIAGNOSIS — I129 Hypertensive chronic kidney disease with stage 1 through stage 4 chronic kidney disease, or unspecified chronic kidney disease: Secondary | ICD-10-CM | POA: Insufficient documentation

## 2021-10-13 LAB — COMPREHENSIVE METABOLIC PANEL
ALT: 20 U/L (ref 0–44)
ALT: 19 U/L (ref 0–44)
AST: 18 U/L (ref 15–41)
AST: 17 U/L (ref 15–41)
Albumin: 2.9 g/dL — ABNORMAL LOW (ref 3.5–5.0)
Albumin: 2.6 g/dL — ABNORMAL LOW (ref 3.5–5.0)
Alkaline Phosphatase: 46 U/L (ref 38–126)
Alkaline Phosphatase: 40 U/L (ref 38–126)
Anion gap: 5 (ref 5–15)
Anion gap: 7 (ref 5–15)
BUN: 26 mg/dL — ABNORMAL HIGH (ref 8–23)
BUN: 57 mg/dL — ABNORMAL HIGH (ref 8–23)
CO2: 33 mmol/L — ABNORMAL HIGH (ref 22–32)
CO2: 29 mmol/L (ref 22–32)
Calcium: 8.5 mg/dL — ABNORMAL LOW (ref 8.9–10.3)
Calcium: 8.3 mg/dL — ABNORMAL LOW (ref 8.9–10.3)
Chloride: 95 mmol/L — ABNORMAL LOW (ref 98–111)
Chloride: 96 mmol/L — ABNORMAL LOW (ref 98–111)
Creatinine, Ser: 0.84 mg/dL (ref 0.61–1.24)
Creatinine, Ser: 0.95 mg/dL (ref 0.61–1.24)
GFR, Estimated: >60 mL/min (ref >60)
GFR, Estimated: >60 mL/min (ref >60)
Glucose, Bld: 111 mg/dL — ABNORMAL HIGH (ref 70–99)
Glucose, Bld: 157 mg/dL — ABNORMAL HIGH (ref 70–99)
Potassium: 4.2 mmol/L (ref 3.5–5.1)
Potassium: 5 mmol/L (ref 3.5–5.1)
Sodium: 133 mmol/L — ABNORMAL LOW (ref 135–145)
Sodium: 132 mmol/L — ABNORMAL LOW (ref 135–145)
Total Bilirubin: 0.6 mg/dL (ref 0.3–1.2)
Total Bilirubin: 0.8 mg/dL (ref 0.3–1.2)
Total Protein: 5.6 g/dL — ABNORMAL LOW (ref 6.5–8.1)
Total Protein: 5.4 g/dL — ABNORMAL LOW (ref 6.5–8.1)

## 2021-10-13 LAB — CBC WITH DIFFERENTIAL/PLATELET
Abs Immature Granulocytes: 0.08 10*3/uL — ABNORMAL HIGH (ref 0.00–0.07)
Abs Immature Granulocytes: 0.09 10*3/uL — ABNORMAL HIGH (ref 0.00–0.07)
Basophils Absolute: 0 10*3/uL (ref 0.0–0.1)
Basophils Absolute: 0 10*3/uL (ref 0.0–0.1)
Basophils Relative: 0 %
Basophils Relative: 0 %
Eosinophils Absolute: 0.1 10*3/uL (ref 0.0–0.5)
Eosinophils Absolute: 0 10*3/uL (ref 0.0–0.5)
Eosinophils Relative: 1 %
Eosinophils Relative: 0 %
HCT: 30.7 % — ABNORMAL LOW (ref 39.0–52.0)
HCT: 24.1 % — ABNORMAL LOW (ref 39.0–52.0)
Hemoglobin: 9.8 g/dL — ABNORMAL LOW (ref 13.0–17.0)
Hemoglobin: 7.7 g/dL — ABNORMAL LOW (ref 13.0–17.0)
Immature Granulocytes: 1 %
Immature Granulocytes: 1 %
Lymphocytes Relative: 2 %
Lymphocytes Relative: 1 %
Lymphs Abs: 0.2 10*3/uL — ABNORMAL LOW (ref 0.7–4.0)
Lymphs Abs: 0.2 10*3/uL — ABNORMAL LOW (ref 0.7–4.0)
MCH: 30.8 pg (ref 26.0–34.0)
MCH: 30.2 pg (ref 26.0–34.0)
MCHC: 31.9 g/dL (ref 30.0–36.0)
MCHC: 32 g/dL (ref 30.0–36.0)
MCV: 96.5 fL (ref 80.0–100.0)
MCV: 94.5 fL (ref 80.0–100.0)
Monocytes Absolute: 1.1 10*3/uL — ABNORMAL HIGH (ref 0.1–1.0)
Monocytes Absolute: 0.2 10*3/uL (ref 0.1–1.0)
Monocytes Relative: 7 %
Monocytes Relative: 1 %
Neutro Abs: 13 10*3/uL — ABNORMAL HIGH (ref 1.7–7.7)
Neutro Abs: 13.5 10*3/uL — ABNORMAL HIGH (ref 1.7–7.7)
Neutrophils Relative %: 89 %
Neutrophils Relative %: 97 %
Platelets: 346 10*3/uL (ref 150–400)
Platelets: 314 10*3/uL (ref 150–400)
RBC: 3.18 MIL/uL — ABNORMAL LOW (ref 4.22–5.81)
RBC: 2.55 MIL/uL — ABNORMAL LOW (ref 4.22–5.81)
RDW: 16.3 % — ABNORMAL HIGH (ref 11.5–15.5)
RDW: 16.3 % — ABNORMAL HIGH (ref 11.5–15.5)
WBC: 14.5 10*3/uL — ABNORMAL HIGH (ref 4.0–10.5)
WBC: 13.9 10*3/uL — ABNORMAL HIGH (ref 4.0–10.5)
nRBC: 0 % (ref 0.0–0.2)
nRBC: 0 % (ref 0.0–0.2)

## 2021-10-13 LAB — URINALYSIS, COMPLETE (UACMP) WITH MICROSCOPIC
Bacteria, UA: NONE SEEN
Bilirubin Urine: NEGATIVE
Glucose, UA: NEGATIVE mg/dL
Hgb urine dipstick: NEGATIVE
Ketones, ur: NEGATIVE mg/dL
Leukocytes,Ua: NEGATIVE
Nitrite: NEGATIVE
Protein, ur: NEGATIVE mg/dL
Specific Gravity, Urine: 1.019 (ref 1.005–1.030)
Squamous Epithelial / HPF: NONE SEEN (ref 0–5)
pH: 7 (ref 5.0–8.0)

## 2021-10-13 LAB — PROTIME-INR
INR: 1.1 (ref 0.8–1.2)
INR: 1.1 (ref 0.8–1.2)
Prothrombin Time: 13.9 seconds (ref 11.4–15.2)
Prothrombin Time: 14.2 seconds (ref 11.4–15.2)

## 2021-10-13 LAB — PROCALCITONIN: Procalcitonin: <0.1 ng/mL

## 2021-10-13 LAB — TYPE AND SCREEN
ABO/RH(D): A NEG
Antibody Screen: NEGATIVE

## 2021-10-13 LAB — RESP PANEL BY RT-PCR (FLU A&B, COVID) ARPGX2
Influenza A by PCR: NEGATIVE
Influenza B by PCR: NEGATIVE
SARS Coronavirus 2 by RT PCR: POSITIVE — AB

## 2021-10-13 LAB — PREPARE RBC (CROSSMATCH)

## 2021-10-13 LAB — LACTIC ACID, PLASMA: Lactic Acid, Venous: 1.3 mmol/L (ref 0.5–1.9)

## 2021-10-13 LAB — APTT: aPTT: 30 seconds (ref 24–36)

## 2021-10-13 LAB — BRAIN NATRIURETIC PEPTIDE: B Natriuretic Peptide: 25.5 pg/mL (ref 0.0–100.0)

## 2021-10-13 IMAGING — DX DG CHEST 1V PORT
2 series · 2 of 2 positions shown · non-contrast
Comparison: Chest CT from 3 days ago

CLINICAL DATA: Vomiting blood.  Esophageal cancer

EXAM:
PORTABLE CHEST 1 VIEW

[chest ap (1 of 2)]
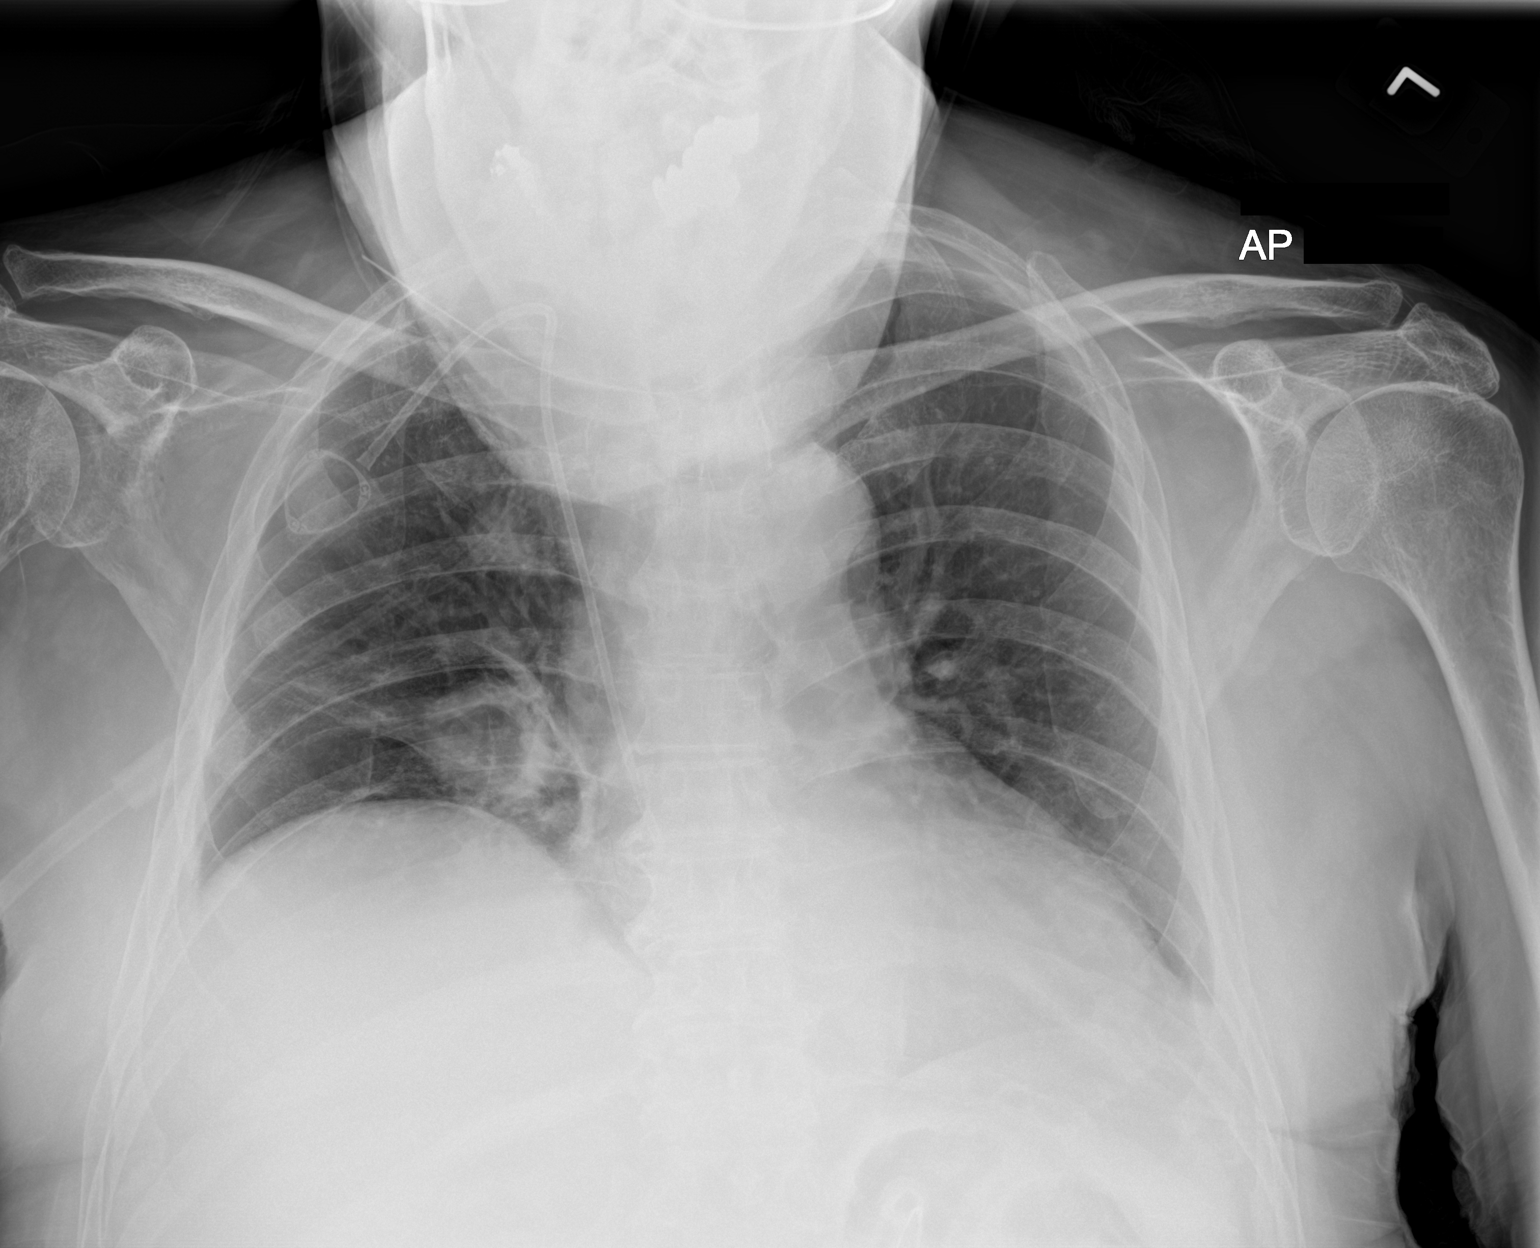

[chest ap (2 of 2)]
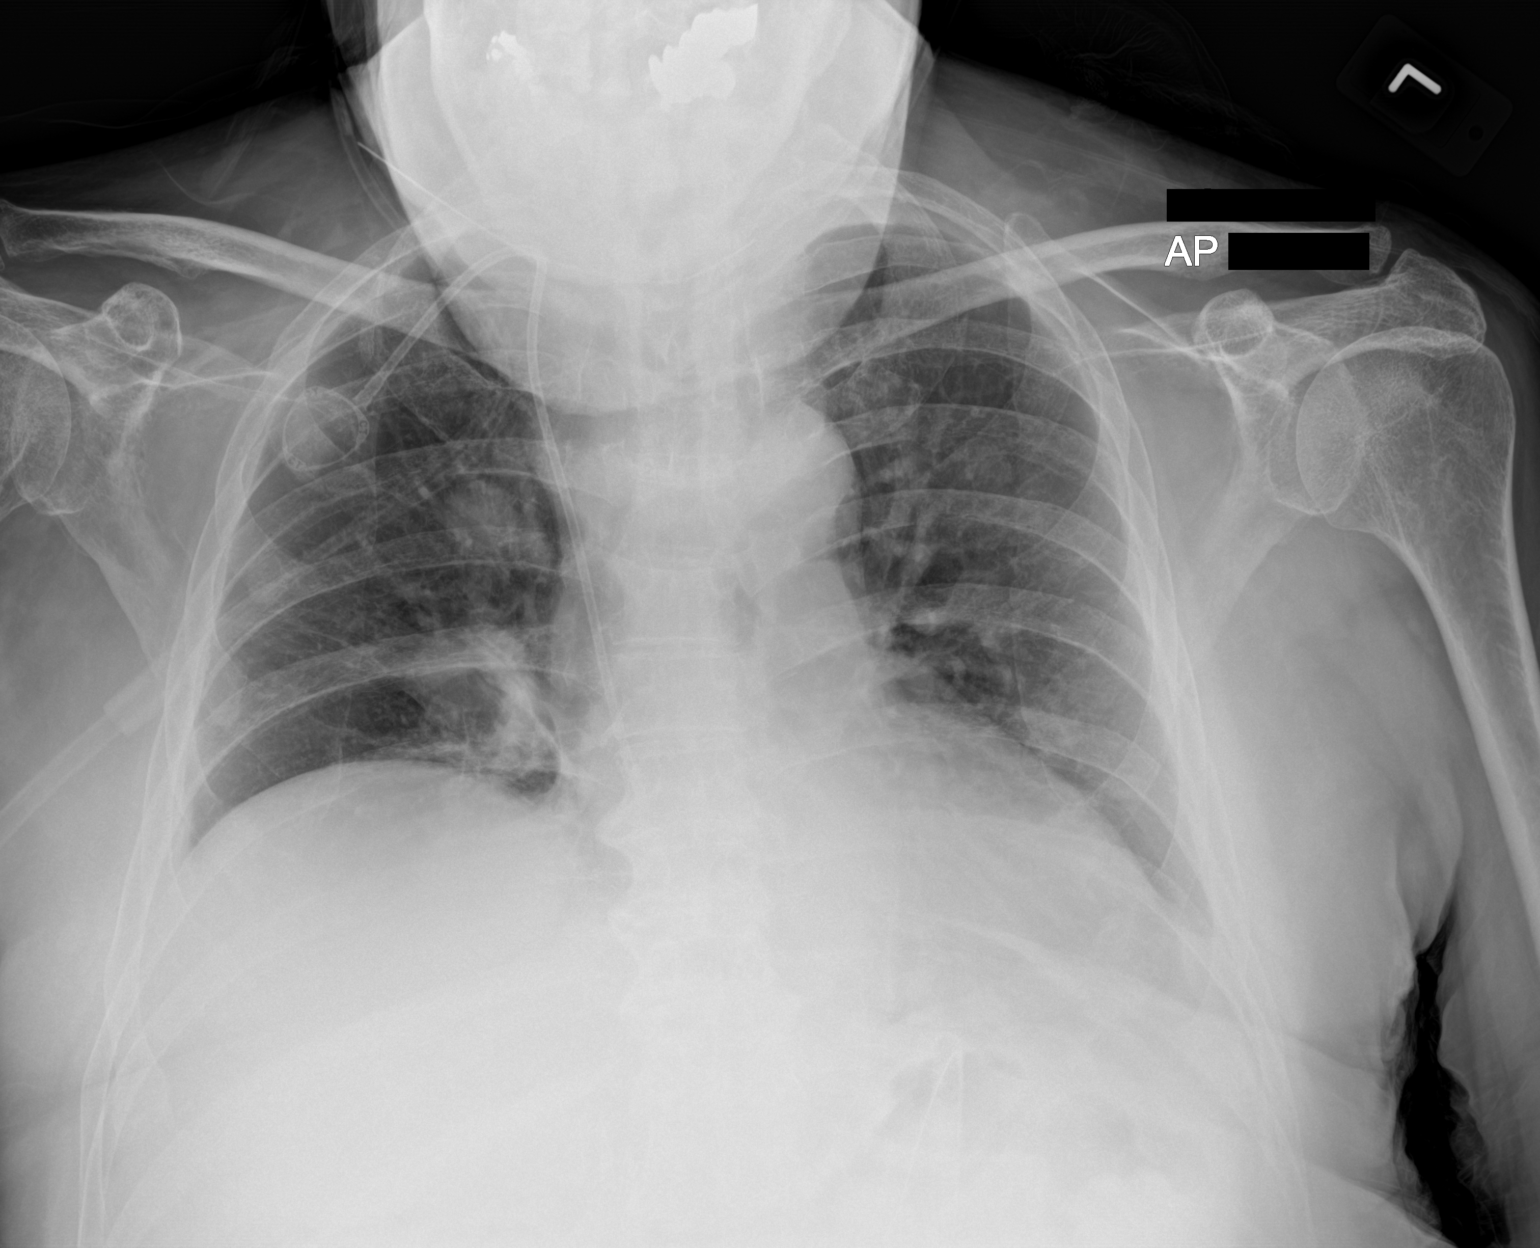

[2 of 2 positions shown; findings below may reference images not displayed]

FINDINGS: Low volume chest with asymmetric right diaphragm elevation. Known
pulmonary metastatic disease greatest at the right base. Porta
catheter on the right with tip at the upper cavoatrial junction. No
edema, significant effusion, or air leak.
IMPRESSION: Low volume chest with pulmonary metastatic disease, stable from
chest CT 3 days ago.

## 2021-10-13 MED ORDER — SODIUM CHLORIDE 0.9 % IV BOLUS
1000.0000 mL | Freq: Once | INTRAVENOUS | Status: AC
  Administered 2021-10-13: 1000 mL via INTRAVENOUS

## 2021-10-13 MED ORDER — GLYCOPYRROLATE 0.2 MG/ML IJ SOLN
0.1000 mg | Freq: Once | INTRAMUSCULAR | Status: AC
  Administered 2021-10-13: 0.1 mg via INTRAVENOUS
  Filled 2021-10-13: qty 0.5

## 2021-10-13 MED ORDER — HYDROMORPHONE HCL 1 MG/ML IJ SOLN
0.5000 mg | Freq: Once | INTRAMUSCULAR | Status: AC
  Administered 2021-10-13: 0.5 mg via INTRAVENOUS
  Filled 2021-10-13: qty 1

## 2021-10-13 MED ORDER — MORPHINE SULFATE (PF) 4 MG/ML IV SOLN: 6.0000 mg | Freq: Once | INTRAVENOUS | Status: DC

## 2021-10-13 MED ORDER — IPRATROPIUM-ALBUTEROL 0.5-2.5 (3) MG/3ML IN SOLN
3.0000 mL | Freq: Once | RESPIRATORY_TRACT | Status: AC
  Administered 2021-10-13: 3 mL via RESPIRATORY_TRACT
  Filled 2021-10-13: qty 3

## 2021-10-13 MED ORDER — SODIUM CHLORIDE 0.9 % IV SOLN
10.0000 mL/h | Freq: Once | INTRAVENOUS | Status: AC
  Administered 2021-10-13: 10 mL/h via INTRAVENOUS

## 2021-10-13 MED ORDER — PANTOPRAZOLE SODIUM 40 MG IV SOLR: 40.0000 mg | Freq: Two times a day (BID) | INTRAVENOUS | Status: DC

## 2021-10-13 MED ORDER — PANTOPRAZOLE INFUSION (NEW) - SIMPLE MED
8.0000 mg/h | INTRAVENOUS | Status: DC
  Administered 2021-10-13: 8 mg/h via INTRAVENOUS
  Filled 2021-10-13: qty 100

## 2021-10-13 MED ORDER — PANTOPRAZOLE 80MG IVPB - SIMPLE MED
80.0000 mg | Freq: Once | INTRAVENOUS | Status: AC
  Administered 2021-10-13: 80 mg via INTRAVENOUS
  Filled 2021-10-13: qty 80

## 2021-10-13 MED ORDER — METHYLPREDNISOLONE SODIUM SUCC 125 MG IJ SOLR
125.0000 mg | Freq: Once | INTRAMUSCULAR | Status: AC
  Administered 2021-10-13: 125 mg via INTRAVENOUS
  Filled 2021-10-13: qty 2

## 2021-10-14 ENCOUNTER — Telehealth: Payer: Self-pay | Admitting: Oncology

## 2021-10-14 LAB — TYPE AND SCREEN
ABO/RH(D): A NEG
Antibody Screen: NEGATIVE
Unit division: 0
Unit division: 0

## 2021-10-14 LAB — URINE CULTURE: Culture: NO GROWTH

## 2021-10-14 LAB — BPAM RBC
Blood Product Expiration Date: 202211182359
Blood Product Expiration Date: 202211232359
ISSUE DATE / TIME: 202211101759
ISSUE DATE / TIME: 202211101759
Unit Type and Rh: 5100
Unit Type and Rh: 5100

## 2021-10-14 NOTE — Telephone Encounter (Signed)
Patient's sister requests a call back and she thanks [place] for all we did for Ivan Garcia. emotional support was provided.

## 2021-10-18 ENCOUNTER — Inpatient Hospital Stay: Payer: Medicare Other | Admitting: Hospice and Palliative Medicine

## 2021-10-18 LAB — CULTURE, BLOOD (ROUTINE X 2)
Culture: NO GROWTH
Culture: NO GROWTH

## 2021-10-30 ENCOUNTER — Other Ambulatory Visit: Payer: Self-pay | Admitting: Oncology

## 2021-11-03 NOTE — ED Provider Notes (Signed)
[facility] Emergency Department Provider Note  ____________________________________________   I have reviewed the triage vital signs and the nursing notes.   HISTORY  Chief Complaint Hematemesis   History limited by: Not Limited   HPI Ivan Garcia. is a 65 y.o. male who presents to the emergency department today because of concerns for hematemesis.  This has been present for the past couple of days.  He was seen in the emergency department earlier today for the same symptoms however left because he says it stopped.  Since going home it has not started again.  He states he does feel somewhat weak because of all the blood he is lost.  Patient denies any pain.  Patient with history of esophageal cancer.  Records reviewed. Per medical record review patient has a history of ER visit earlier today for similar symptoms.   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   PEA (Pulseless electrical activity) ([place]) 05/27/2021   Examination of participant in clinical trial 05/03/2021   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   Bone pain 07/14/2020   Encounter for antineoplastic immunotherapy 04/27/2020   Stage 3a chronic kidney disease ([place]) 12/22/2019   Encounter for antineoplastic chemotherapy 10/06/2019   Neoplasm related pain 10/06/2019    Stage 3 chronic kidney disease ([place]) 03/24/2018   Edema 03/24/2018   Port-A-Cath in place 03/24/2018   ARF (acute renal failure) ([place]) 01/02/2018   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   Goals of care, counseling/discussion 12/10/2017   Encounter for screening colonoscopy 02/28/2017    Past Surgical History:  Procedure Laterality Date   COLONOSCOPY WITH PROPOFOL N/A 04/04/2017   Procedure: COLONOSCOPY WITH PROPOFOL;  Surgeon: Robert Bellow, MD;  Location[facility] ENDOSCOPY;  Service: Endoscopy;  Laterality: N/A;   IR GASTROSTOMY TUBE MOD SED  09/24/2020   IR REPLACE G-TUBE SIMPLE WO FLUORO  09/16/2021   MANDIBLE SURGERY     PORT-A-CATH REMOVAL  07/17/2018   PORTA CATH INSERTION N/A 12/19/2017   Procedure: PORTA CATH INSERTION;  Surgeon: Algernon Huxley, MD;  Location: [place] CV LAB;  Service: Cardiovascular;  Laterality: N/A;   PORTA CATH INSERTION N/A 07/17/2018   Procedure: PORTA CATH INSERTION;  Surgeon: Algernon Huxley, MD;  Location: [place] [facility];  Service: Cardiovascular;  Laterality: N/A;   PORTA CATH INSERTION N/A 09/18/2019   Procedure: PORTA CATH INSERTION;  Surgeon: Algernon Huxley, MD;  Location: [place] [facility];  Service: Cardiovascular;  Laterality: N/A;   TONSILLECTOMY      Prior to Admission medications   Medication Sig Start Date End Date Taking? Authorizing Provider  acetaminophen (TYLENOL) 160 MG/5ML solution Take by mouth. Patient not taking: Reported on 10/12/2021 05/13/21   [provider]  ALPRAZolam Duanne Moron) 0.25 MG tablet Place  1 tablet (0.25 mg total) into feeding tube at bedtime as needed for anxiety. Patient not taking: No sig reported 08/13/21   Mansy, Jan A, MD  amLODipine (NORVASC) 5 MG tablet Take 5 mg by mouth daily.    [provider]  aspirin 81 MG chewable tablet Place 1 tablet (81 mg total) into feeding tube daily. 08/14/21   Mansy, Arvella Merles, MD  atorvastatin (LIPITOR) 40 MG tablet Place 1 tablet (40 mg total)  into feeding tube daily. 08/14/21   Mansy, Arvella Merles, MD  cetirizine (ZYRTEC) 10 MG tablet Take by mouth. Patient not taking: Reported on 10/12/2021    [provider]  diphenoxylate-atropine (LOMOTIL) 2.5-0.025 MG/5ML liquid Take 5 mLs by mouth 4 (four) times daily as needed for diarrhea or loose stools. Patient not taking: No sig reported 08/13/21   Mansy, Jan A, MD  enoxaparin (LOVENOX) 40 MG/0.4ML injection Inject 0.4 mLs (40 mg total) into the skin daily. 08/14/21   Mansy, Arvella Merles, MD  famotidine (PEPCID) 20 MG tablet Take by mouth. Patient not taking: Reported on 10/12/2021 04/29/21   [provider]  fluconazole (DIFLUCAN) 100 MG tablet Take 1 tablet (100 mg total) by mouth daily. Initial 200mg , then 100mg  daily until finish 10/12/21   Earlie Server, MD  folic acid (FOLVITE) 1 MG tablet Take 1 tablet (1 mg total) by mouth daily. 10/05/21   Earlie Server, MD  gabapentin (NEURONTIN) 100 MG capsule TAKE 2 CAPSULES BY MOUTH 3 TIMES DAILY. 05/31/21   [provider]  HYDROcodone-acetaminophen (NORCO) 10-325 MG tablet Take 1 tablet by mouth every 6 (six) hours as needed. 07/25/21   Borders, Kirt Boys, NP  insulin aspart (NOVOLOG) 100 UNIT/ML injection Inject 0-9 Units into the skin every 4 (four) hours. Patient not taking: No sig reported 08/14/21   Mansy, Arvella Merles, MD  labetalol (NORMODYNE) 5 MG/ML injection Inject 2 mLs (10 mg total) into the vein every 3 (three) hours as needed (SBP more than 160 or DBP more than 100). 08/13/21   Mansy, Arvella Merles, MD  levothyroxine (SYNTHROID) 88 MCG tablet Take 1 tablet (88 mcg total) by mouth daily before breakfast. 10/05/21   Earlie Server, MD  lidocaine-prilocaine (EMLA) cream Apply to affected area once 09/10/19   Earlie Server, MD  metoprolol succinate (TOPROL-XL) 25 MG 24 hr tablet Take 1 tablet (25 mg total) by mouth daily. 08/14/21   Mansy, Jan A, MD  morphine 2 MG/ML injection Inject 1 mL (2 mg total) into the vein every 4 (four) hours as needed. Patient not taking: No  sig reported 08/13/21   Mansy, Arvella Merles, MD  Nutritional Supplements (FEEDING SUPPLEMENT, KATE FARMS STANDARD 1.4,) LIQD liquid Place 325 mLs into feeding tube 5 (five) times daily. 08/13/21   Mansy, Arvella Merles, MD  nystatin (MYCOSTATIN) 100000 UNIT/ML suspension Use as directed 5 mLs (500,000 Units total) in the mouth or throat in the morning, at noon, and at bedtime. SWISH AND SPIT 10/05/21   Earlie Server, MD  ondansetron (ZOFRAN) 8 MG tablet Take 1 tablet (8 mg total) by mouth 2 (two) times daily as needed for refractory nausea / vomiting. Start on day 3 after carboplatin chemo. 10/05/21   Earlie Server, MD  oxyCODONE (ROXICODONE) 5 MG/5ML solution PLACE 5-10 MLS (5-10 MG TOTAL) INTO FEEDING TUBE EVERY 4 (FOUR) HOURS AS NEEDED FOR SEVERE PAIN. Patient not taking: No sig reported 05/17/21   [provider]  pantoprazole sodium (PROTONIX) 40 mg PACK Place 20 mLs (40 mg  total) into feeding tube daily. Patient not taking: Reported on 10/12/2021 08/14/21   Mansy, Arvella Merles, MD  predniSONE (DELTASONE) 20 MG tablet Take 1 tablet (20 mg total) by mouth daily with breakfast. 09/16/21   Vaslow, Acey Lav, MD  pregabalin (LYRICA) 150 MG capsule Take 1 capsule (150 mg total) by mouth 2 (two) times daily. Patient not taking: Reported on 10/12/2021 07/08/21   Ventura Sellers, MD  prochlorperazine (COMPAZINE) 10 MG tablet Take 1 tablet (10 mg total) by mouth every 6 (six) hours as needed (Nausea or vomiting). Patient not taking: Reported on 10/12/2021 09/10/19   Earlie Server, MD  senna-docusate (SENOKOT-S) 8.6-50 MG tablet Take 2 tablets by mouth daily. Patient not taking: No sig reported 05/27/21   Earlie Server, MD  sodium chloride 0.9 % infusion Inject 1,000 mLs into the vein as needed (for administration of IV medications (carrier fluid)). Patient not taking: No sig reported 08/13/21   Mansy, Arvella Merles, MD  traMADol (ULTRAM) 50 MG tablet Place 1 tablet (50 mg total) into feeding tube every 6 (six) hours as needed for moderate pain. Patient  not taking: No sig reported 08/13/21   Mansy, Arvella Merles, MD  Water For Irrigation, Sterile (FREE WATER) SOLN Place 120 mLs into feeding tube 5 (five) times daily. Patient not taking: No sig reported 08/13/21   Mansy, Arvella Merles, MD    Allergies Cetuximab  Family History  Problem Relation Age of Onset   Breast cancer Mother    Asthma Mother    Congestive Heart Failure Mother    Prostate cancer Father    Brain cancer Father    Bladder Cancer Father     Social History Social History   Tobacco Use   Smoking status: Never   Smokeless tobacco: Never  Vaping Use   Vaping Use: Never used  Substance Use Topics   Alcohol use: No   Drug use: No    Review of Systems Constitutional: No fever/chills. Positive for weakness.  Eyes: No visual changes. ENT: No sore throat. Cardiovascular: Denies chest pain. Respiratory: Denies shortness of breath. Gastrointestinal: Positive for spitting up blood.  Genitourinary: Negative for dysuria. Musculoskeletal: Negative for back pain. Skin: Negative for rash. Neurological: Negative for headaches, focal weakness or numbness.  ____________________________________________   PHYSICAL EXAM:  VITAL SIGNS: ED Triage Vitals [10/27/2021 1723]  Enc Vitals Group     BP (!) 88/69     Pulse Rate (!) 127     Resp (!) 24     Temp      Temp src      SpO2 97 %     Weight 179 lb 14.3 oz (81.6 kg)     Height 5\' 9"  (1.753 m)     Head Circumference      Peak Flow      Pain Score 3   Constitutional: Alert and oriented.  Eyes: Conjunctivae are normal.  ENT      Head: Normocephalic and atraumatic.      Nose: No congestion/rhinnorhea.      Mouth/Throat: Blood noted in oropharynx.       Neck: No stridor. Hematological/Lymphatic/Immunilogical: No cervical lymphadenopathy. Cardiovascular: Normal rate, regular rhythm.  No murmurs, rubs, or gallops. Respiratory: Normal respiratory effort without tachypnea nor retractions. Breath sounds are clear and equal  bilaterally. No wheezes/rales/rhonchi. Gastrointestinal: Soft and non tender. G-tube in place.  Genitourinary: Deferred Musculoskeletal: Normal range of motion in all extremities. No lower extremity edema. Neurologic:  Normal speech and language. No  gross focal neurologic deficits are appreciated.  Skin:  Skin is warm, dry and intact. No rash noted. Psychiatric: Mood and affect are normal. Speech and behavior are normal. Patient exhibits appropriate insight and judgment.  ____________________________________________    LABS (pertinent positives/negatives)  CMP na 132, k 5.0, glu 157, cr 0.95 CBC wbc 13.9, hgb 7.7, plt 314  ____________________________________________   EKG  I, Nance Pear, attending physician, personally viewed and interpreted this EKG  EKG Time: 1727 Rate: 120 Rhythm: sinus tachycardia Axis: normal Intervals: qtc 448 QRS: narrow ST changes: no st elevation Impression: abnormal   ____________________________________________    RADIOLOGY  None  ____________________________________________   PROCEDURES  Procedures  CRITICAL CARE Performed by: Nance Pear   Total critical care time: 40 minutes  Critical care time was exclusive of separately billable procedures and treating other patients.  Critical care was necessary to treat or prevent imminent or life-threatening deterioration.  Critical care was time spent personally by me on the following activities: development of treatment plan with patient and/or surrogate as well as nursing, discussions with consultants, evaluation of patient's response to treatment, examination of patient, obtaining history from patient or surrogate, ordering and performing treatments and interventions, ordering and review of laboratory studies, ordering and review of radiographic studies, pulse oximetry and re-evaluation of patient's condition.  ____________________________________________   INITIAL  IMPRESSION / ASSESSMENT AND PLAN / ED COURSE  Pertinent labs & imaging results that were available during my care of the patient were reviewed by me and considered in my medical decision making (see chart for details).   Patient presented to the emergency department today because of concerns for spitting up blood.  He had been seen in the emergency department earlier today for this but returned after it began again.  On my initial exam patient was somewhat tachycardic and hypotensive.  Blood work did show a drop in his hemoglobin from earlier testing.  Because of this and concern for continued bleeding 2 units of emergent blood were given.  I did have a discussion with the patient prior to giving the donor blood.  I did see that he had had some questions about that earlier but at this time he stated that he was okay receiving the blood products.  He did reiterate however that he was a DNI DNR.  After receiving 2 units of blood his vital signs did improve.  His heart rate did come down slightly and his blood pressure did increase.  Did discuss his case with GI.  I did also discuss with POA over the telephone that critical nature of the patient's condition.  At this time the decision was made that given patient's wishes for DNI/DNR any advanced procedures or surgical interventions would be likely of very little benefit and would not be in keeping with the patient's wishes.  A little while after the patient received the blood products I was called into the room because the patient had a large amount of blood coming from his mouth and nose.  Patient was unresponsive at this time.  The patient stopped breathing in his heart rate became bradycardic.  I do think it is likely he aspirated a large amount of blood.  The patient passed shortly thereafter. ____________________________________________   FINAL CLINICAL IMPRESSION(S) / ED DIAGNOSES  Final diagnoses:  Gastrointestinal hemorrhage, unspecified  gastrointestinal hemorrhage type  Cardiopulmonary arrest [facility])     Note: This dictation was prepared with Dragon dictation. Any transcriptional errors that result from this process are  unintentional     Nance Pear, MD 2021/11/08 2025

## 2021-11-03 NOTE — ED Provider Notes (Signed)
[facility] Emergency Department Provider Note ____________________________________________   Event Date/Time   First MD Initiated Contact with Patient 10-19-2021 [phone]     (approximate)  I have reviewed the triage vital signs and the nursing notes.  HISTORY  Chief Complaint Hematemesis   HPI Ivan Garcia. is a 65 y.o. malewho presents to the ED for evaluation of spitting up blood.   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 Mets to supraclavicular nodes 2020, more chemo. Progressing to palliative chemo/radiation last year. Trialed some mabs. Stroke, aspiration pna 2 months ago. Ef40%. Now back home after inpatient rehab. Saw oncology yesterday, looks like progressing to hospice and comfort care, outpatient CT w progression of disease and contained esophageal perf. Diflucan for thrush. 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   Pt presents from home via EMS for evaluation of 2 days of spitting up blood. He does report increased cough, increased sputum production. Denies SOB and CP. No frank blood in the productive cough, just thick mucus. Blood sounds more GI/esophageal in etiology. No other blood such as epistaxis, melena, hematuria. Reports chronic sore throat, no worse today.   No fever, syncope, falls, abd pain.   On arrival, sats on room air 91-93%, placed on [facility] with improvement.   Pt confirms goals of comfort and no longer pursuing chemo/radiation treatment. No surgical options. Denies pain or needs at this time. Isn't sure if he'd want a blood transfusion if necessary , but is OK with IV meds, antibiotics if infection, admission if needed.   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   PEA (Pulseless electrical activity) ([place]) 05/27/2021   Examination of participant in clinical trial 05/03/2021   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   Bone pain 07/14/2020   Encounter for antineoplastic immunotherapy 04/27/2020   Stage 3a chronic kidney disease ([place]) 12/22/2019   Encounter for antineoplastic chemotherapy 10/06/2019   Neoplasm related pain 10/06/2019   Stage 3 chronic kidney disease ([place]) 03/24/2018   Edema 03/24/2018   Port-A-Cath in place 03/24/2018   ARF (acute renal failure) ([place]) 01/02/2018   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[place]) 12/14/2017   Goals of care, counseling/discussion 12/10/2017   Encounter for screening colonoscopy 02/28/2017    Past Surgical History:  Procedure Laterality Date   COLONOSCOPY WITH PROPOFOL N/A 04/04/2017   Procedure: COLONOSCOPY WITH PROPOFOL;  Surgeon: Robert Bellow, MD;  Location: [facility] ENDOSCOPY;  Service: Endoscopy;  Laterality: N/A;   IR GASTROSTOMY TUBE MOD SED  09/24/2020   IR REPLACE G-TUBE SIMPLE WO FLUORO  09/16/2021   MANDIBLE SURGERY     PORT-A-CATH REMOVAL  07/17/2018   PORTA CATH INSERTION N/A 12/19/2017   Procedure: PORTA CATH INSERTION;  Surgeon: Algernon Huxley, MD;  Location: [place] CV LAB;  Service: Cardiovascular;  Laterality: N/A;   PORTA CATH INSERTION N/A 07/17/2018   Procedure: PORTA CATH INSERTION;  Surgeon: Algernon Huxley, MD;  Location: [place] CV LAB;  Service: Cardiovascular;  Laterality: N/A;   PORTA CATH INSERTION N/A 09/18/2019   Procedure: PORTA CATH INSERTION;   Surgeon: Algernon Huxley, MD;  Location: [place] CV LAB;  Service: Cardiovascular;  Laterality: N/A;   TONSILLECTOMY      Prior to Admission medications   Medication Sig Start Date End Date Taking? Authorizing Provider  acetaminophen (TYLENOL) 160 MG/5ML solution Take by mouth. Patient not taking: Reported on 10/12/2021 05/13/21   [provider]  ALPRAZolam Duanne Moron) 0.25 MG tablet Place 1 tablet (0.25 mg total) into feeding tube at bedtime as needed for anxiety. Patient not taking: No sig reported 08/13/21   Mansy, Jan A, MD  amLODipine (NORVASC) 5 MG tablet Take 5 mg by mouth daily.    [provider]  aspirin 81 MG chewable tablet Place 1 tablet (81 mg total) into feeding tube daily. 08/14/21   Mansy, Arvella Merles, MD  atorvastatin (LIPITOR) 40 MG tablet Place 1 tablet (40 mg total) into feeding tube daily. 08/14/21   Mansy, Arvella Merles, MD  cetirizine (ZYRTEC) 10 MG tablet Take by mouth. Patient not taking: Reported on 10/12/2021    [provider]  diphenoxylate-atropine (LOMOTIL) 2.5-0.025 MG/5ML liquid Take 5 mLs by mouth 4 (four) times daily as needed for diarrhea or loose stools. Patient not taking: No sig reported 08/13/21   Mansy, Jan A, MD  enoxaparin (LOVENOX) 40 MG/0.4ML injection Inject 0.4 mLs (40 mg total) into the skin daily. 08/14/21   Mansy, Arvella Merles, MD  famotidine (PEPCID) 20 MG tablet Take by mouth. Patient not taking: Reported on 10/12/2021 04/29/21   [provider]  fluconazole (DIFLUCAN) 100 MG tablet Take 1 tablet (100 mg total) by mouth daily. Initial 200mg , then 100mg  daily until finish 10/12/21   Earlie Server, MD  folic acid (FOLVITE) 1 MG tablet Take 1 tablet (1 mg total) by mouth daily. 10/05/21   Earlie Server, MD  gabapentin (NEURONTIN) 100 MG capsule TAKE 2 CAPSULES BY MOUTH 3 TIMES DAILY. 05/31/21   [provider]  HYDROcodone-acetaminophen (NORCO) 10-325 MG tablet Take 1 tablet by mouth every 6 (six) hours as needed. 07/25/21   Borders, Kirt Boys,  NP  insulin aspart (NOVOLOG) 100 UNIT/ML injection Inject 0-9 Units into the skin every 4 (four) hours. Patient not taking: No sig reported 08/14/21   Mansy, Arvella Merles, MD  labetalol (NORMODYNE) 5 MG/ML injection Inject 2 mLs (10 mg total) into the vein every 3 (three) hours as needed (SBP more than 160 or DBP more than 100). 08/13/21   Mansy, Arvella Merles, MD  levothyroxine (SYNTHROID) 88 MCG tablet Take 1 tablet (88 mcg total) by mouth daily before breakfast. 10/05/21   Earlie Server, MD  lidocaine-prilocaine (EMLA) cream Apply to affected area once 09/10/19   Earlie Server, MD  metoprolol succinate (TOPROL-XL) 25 MG 24 hr tablet Take 1 tablet (25 mg total) by mouth daily. 08/14/21   Mansy, Jan A, MD  morphine 2 MG/ML injection Inject 1 mL (2 mg total) into the vein every 4 (four) hours as needed. Patient not taking: No sig reported 08/13/21   Mansy, Arvella Merles, MD  Nutritional Supplements (FEEDING SUPPLEMENT, KATE FARMS STANDARD 1.4,) LIQD liquid Place 325 mLs into feeding tube 5 (five) times daily. 08/13/21   Mansy, Jan  A, MD  nystatin (MYCOSTATIN) 100000 UNIT/ML suspension Use as directed 5 mLs (500,000 Units total) in the mouth or throat in the morning, at noon, and at bedtime. SWISH AND SPIT 10/05/21   Earlie Server, MD  ondansetron (ZOFRAN) 8 MG tablet Take 1 tablet (8 mg total) by mouth 2 (two) times daily as needed for refractory nausea / vomiting. Start on day 3 after carboplatin chemo. 10/05/21   Earlie Server, MD  oxyCODONE (ROXICODONE) 5 MG/5ML solution PLACE 5-10 MLS (5-10 MG TOTAL) INTO FEEDING TUBE EVERY 4 (FOUR) HOURS AS NEEDED FOR SEVERE PAIN. Patient not taking: No sig reported 05/17/21   [provider]  pantoprazole sodium (PROTONIX) 40 mg PACK Place 20 mLs (40 mg total) into feeding tube daily. Patient not taking: Reported on 10/12/2021 08/14/21   Mansy, Arvella Merles, MD  predniSONE (DELTASONE) 20 MG tablet Take 1 tablet (20 mg total) by mouth daily with breakfast. 09/16/21   Vaslow, Acey Lav, MD  pregabalin (LYRICA) 150  MG capsule Take 1 capsule (150 mg total) by mouth 2 (two) times daily. Patient not taking: Reported on 10/12/2021 07/08/21   Ventura Sellers, MD  prochlorperazine (COMPAZINE) 10 MG tablet Take 1 tablet (10 mg total) by mouth every 6 (six) hours as needed (Nausea or vomiting). Patient not taking: Reported on 10/12/2021 09/10/19   Earlie Server, MD  senna-docusate (SENOKOT-S) 8.6-50 MG tablet Take 2 tablets by mouth daily. Patient not taking: No sig reported 05/27/21   Earlie Server, MD  sodium chloride 0.9 % infusion Inject 1,000 mLs into the vein as needed (for administration of IV medications (carrier fluid)). Patient not taking: No sig reported 08/13/21   Mansy, Arvella Merles, MD  traMADol (ULTRAM) 50 MG tablet Place 1 tablet (50 mg total) into feeding tube every 6 (six) hours as needed for moderate pain. Patient not taking: No sig reported 08/13/21   Mansy, Arvella Merles, MD  Water For Irrigation, Sterile (FREE WATER) SOLN Place 120 mLs into feeding tube 5 (five) times daily. Patient not taking: No sig reported 08/13/21   Mansy, Arvella Merles, MD    Allergies Cetuximab  Family History  Problem Relation Age of Onset   Breast cancer Mother    Asthma Mother    Congestive Heart Failure Mother    Prostate cancer Father    Brain cancer Father    Bladder Cancer Father     Social History Social History   Tobacco Use   Smoking status: Never   Smokeless tobacco: Never  Vaping Use   Vaping Use: Never used  Substance Use Topics   Alcohol use: No   Drug use: No    Review of Systems  Constitutional: No fever/chills Eyes: No visual changes. ENT: Positive for chronic sore throat. Cardiovascular: Denies chest pain. Respiratory: Denies shortness of breath. Positive for productive cough Gastrointestinal: No abdominal pain.  No nausea,  No diarrhea.  No constipation. Positive for spitting up blood Genitourinary: Negative for dysuria. Musculoskeletal: Negative for back pain. Skin: Negative for rash. Neurological: Negative  for headaches, focal weakness or numbness. ____________________________________________   PHYSICAL EXAM:  VITAL SIGNS: Vitals:   10/25/2021 0430 10-25-2021 0500  BP: 111/66 117/72  Pulse: (!) 108 (!) 108  Resp:    Temp:    SpO2: 99% 95%    Constitutional: Alert and oriented. Conversational in a raspy voice, pleasant, no distress. Hunched over an emesis bag, occasional spitting up red bloody sputum.  Eyes: Conjunctivae are normal. PERRL. EOMI. Head: Atraumatic. Nose: No  congestion/rhinnorhea. Mouth/Throat: Mucous membranes are moist.   Mallampati 4, cannot visualize posterior oropharynx reliably.  Neck: No stridor. No cervical spine tenderness to palpation. Cardiovascular: Tachycardic rate, regular rhythm. Grossly normal heart sounds.  Good peripheral circulation. Respiratory: Mild tachypnea , wheezing throughout Gastrointestinal: Soft , nondistended, nontender to palpation. No CVA tenderness. PEG in place, clean insertion site, benign abd Musculoskeletal: No lower extremity tenderness.  No joint effusions. No signs of acute trauma. Pitting edema to bilateral lower extremities.  Neurologic:  Normal speech and language. No gross focal neurologic deficits are appreciated. No gait instability noted. Skin:  Skin is warm, dry and intact. No rash noted. Psychiatric: Mood and affect are normal. Speech and behavior are normal.  ____________________________________________   LABS (all labs ordered are listed, but only abnormal results are displayed)  Labs Reviewed  COMPREHENSIVE METABOLIC PANEL - Abnormal; Notable for the following components:      Result Value   Sodium 133 (*)    Chloride 95 (*)    CO2 33 (*)    Glucose, Bld 111 (*)    BUN 26 (*)    Calcium 8.5 (*)    Total Protein 5.6 (*)    Albumin 2.9 (*)    All other components within normal limits  CBC WITH DIFFERENTIAL/PLATELET - Abnormal; Notable for the following components:   WBC 14.5 (*)    RBC 3.18 (*)    Hemoglobin  9.8 (*)    HCT 30.7 (*)    RDW 16.3 (*)    Neutro Abs 13.0 (*)    Lymphs Abs 0.2 (*)    Monocytes Absolute 1.1 (*)    Abs Immature Granulocytes 0.08 (*)    All other components within normal limits  URINALYSIS, COMPLETE (UACMP) WITH MICROSCOPIC - Abnormal; Notable for the following components:   Color, Urine YELLOW (*)    APPearance HAZY (*)    All other components within normal limits  CULTURE, BLOOD (ROUTINE X 2)  CULTURE, BLOOD (ROUTINE X 2)  URINE CULTURE  RESP PANEL BY RT-PCR (FLU A&B, COVID) ARPGX2  LACTIC ACID, PLASMA  PROTIME-INR  APTT  PROCALCITONIN  BRAIN NATRIURETIC PEPTIDE  LACTIC ACID, PLASMA  TYPE AND SCREEN   ____________________________________________  12 Lead EKG   ____________________________________________  RADIOLOGY  ED MD interpretation:  CXR reviewed by me without evidence of acute cardiopulmonary pathology.  Chronic mets noted  Official radiology report(s): DG Chest Port 1 View  Result Date: 2021-10-27 CLINICAL DATA:  Vomiting blood.  Esophageal cancer EXAM: PORTABLE CHEST 1 VIEW COMPARISON:  Chest CT from 3 days ago FINDINGS: Low volume chest with asymmetric right diaphragm elevation. Known pulmonary metastatic disease greatest at the right base. Porta catheter on the right with tip at the upper cavoatrial junction. No edema, significant effusion, or air leak. IMPRESSION: Low volume chest with pulmonary metastatic disease, stable from chest CT 3 days ago. Electronically Signed   By: Jorje Guild M.D.   On: October 27, 2021 04:52    ____________________________________________   PROCEDURES and INTERVENTIONS  Procedure(s) performed (including Critical Care):  .1-3 Lead EKG Interpretation Performed by: Vladimir Crofts, MD Authorized by: Vladimir Crofts, MD     Interpretation: abnormal     ECG rate:  110   ECG rate assessment: tachycardic     Rhythm: sinus tachycardia     Ectopy: none     Conduction: normal    Medications  ipratropium-albuterol  (DUONEB) 0.5-2.5 (3) MG/3ML nebulizer solution 3 mL (3 mLs Nebulization Given Oct 27, 2021 0448)  glycopyrrolate (ROBINUL)  injection 0.1 mg (0.1 mg Intravenous Given 10-25-21 0544)  HYDROmorphone (DILAUDID) injection 0.5 mg (0.5 mg Intravenous Given 10/25/2021 0503)  methylPREDNISolone sodium succinate (SOLU-MEDROL) 125 mg/2 mL injection 125 mg (125 mg Intravenous Given Oct 25, 2021 0544)    ____________________________________________   MDM / ED COURSE   65 year old male with advanced head and neck cancer presents to the ED with hematemesis, requesting discharge and amenable to outpatient care with hospice follow-up.  He is spitting up small volume blood, but no evidence of active bleeding, distress or respiratory failure.  Anticipate this is esophageal from his advancing cancer.  He had just seen oncology during the day yesterday and discussed progression to hospice care, which I think is reasonable.  I had offered him medical observation admission to help facilitate this and control his symptoms but he refuses and saying that he just wants to go home.  He certainly has capacity to make this decision and expresses understanding of risks of worsening disease and symptoms, death.  I provide him with information for [facility], and urged him to reach back out to his oncologist. Return precautions for the ED discussed.   Clinical Course as of October 25, 2021 2010  Thu 10/25/2021  0712 Returned to the bedside to discuss goals of care, treatment options after reviewing his chart. DNR. OK with medical admission, IV fluids/medications, antibiotics if necessary. No procedural intervention. No current pain or needs. [DS]  0600 Reassessed.  Patient reports that he is ready to go home.  We discussed his symptoms and my concern for safety at home by himself.  He says that he is fine and really does not want to stay in the hospital.  We discussed reaching out to his oncologist and to palliative/hospice later this morning.  We  discussed return precautions for the ED. [DS]    Clinical Course User Index [DS] Vladimir Crofts, MD    ____________________________________________   FINAL CLINICAL IMPRESSION(S) / ED DIAGNOSES  Final diagnoses:  Hematemesis without nausea     ED Discharge Orders     None        Goebel Hellums Tamala Julian   Note:  This document was prepared using Dragon voice recognition software and may include unintentional dictation errors.    Vladimir Crofts, MD 2021/10/25 [phone]

## 2021-11-03 NOTE — ED Notes (Signed)
Post mortem care provided at this time. Pt washed and IV's/monitoring equipment removed. Pt placed in body bag and tags applied.

## 2021-11-03 NOTE — Telephone Encounter (Signed)
I spoke with patient by phone.  He states that his goal is to stay home and be comfortable.  He feels further treatment would be "futile" at this point.  He is interested in pursuing hospice involvement.  I called in an order for hospice to [facility].

## 2021-11-03 NOTE — ED Notes (Signed)
Chaplain and best friend at bedside.

## 2021-11-03 NOTE — ED Notes (Signed)
Provider Archie Balboa) at bedside. 1 unit of emergency release uncrossmatched blood started at this time via rapid infuser.

## 2021-11-03 NOTE — Telephone Encounter (Signed)
Patient called reporting that he has been spitting up blood off and on all day and is asking if something can be done to stop it. At same time, his sister from [place] called stating that patient is bleeding profusely. I called her back after speaking with JB, NP and we had a 3 way conversation where she reported that there was blood all over the bathroom per his friend. As well as spitting up tissue. She also reports that he refused hospice due to the fact that he would not be able to continue with physical therapy services. The discussion went that Merrily Pew will re contact hospice to see if they will go back out, [name] asked that her son who has second POA be added to his contacts Ina Kick [phone]. She reports that patient is also confused today.and that he left the ER by calling his neighbor at 3 AM to come get him before he was discharged by physician.  J Borders, NP called me back to let me know that patient has decided to go to ER and was almost hostile to him when discussing his situation and hospice. Patient nephew Rodman Key is on his way to see patient now 2 hours away and [name] who has POA is coming tomorrow form [place].

## 2021-11-03 NOTE — ED Triage Notes (Signed)
Pt brought in by EMS with Hx of throat cancer. Pt complains of spitting up blood and a sore throat.

## 2021-11-03 NOTE — Discharge Instructions (Addendum)
Please reach out to Dr. Tasia Catchings and the Palliative care team.

## 2021-11-03 NOTE — ED Notes (Signed)
Pt spitting up copious blood. EDP called to room and several ED nurses in room. Pt being suctioned. Labs were redrawn. Pt noted to have gasping respirations. Femoral pulse 45.

## 2021-11-03 NOTE — ED Notes (Signed)
First unit of rapid infusion complete. Provider still at bedside. 430 mL infused.

## 2021-11-03 NOTE — ED Notes (Addendum)
Second unit of rapid transfusion complete at this time. Provider still at bedside.

## 2021-11-03 NOTE — ED Notes (Signed)
Second unit of rapid infusion started at this time. Provider at bedside.

## 2021-11-03 NOTE — Telephone Encounter (Signed)
Patient called reporting that he went to ER again this morning due to vomiting blood. He is asking when Dr Tasia Catchings is going to have hospice come out to start services and requests a return call to let him know. He does not see Sharion Dove, NP until Tuesday, but he would like to get started on Hospice services asap

## 2021-11-03 NOTE — ED Notes (Addendum)
Time of death called by Archie Balboa, MD.

## 2021-11-03 NOTE — ED Notes (Signed)
No pulses felt at this time. MD Archie Balboa called time of death 37.

## 2021-11-03 NOTE — ED Notes (Signed)
Pt refusing to stay in stretcher due to not being comfortable. Pt is now in recliner chair hooked up to monitor. Pt is alert and oriented and ambulates self.

## 2021-11-03 NOTE — ED Notes (Signed)
Pt refusing discharge blood pressure and temp at this time.

## 2021-11-03 NOTE — Progress Notes (Signed)
Spoke with EM Physician on the phone Patient presented earlier this morning to ED with c/o spitting up blood, this improved and patient went home. Unfortunately, this apparently worsened and he returned. Hgb has descended two grams and he is hypotensive and tachycardic. Currently receiving transfusion Pt has known significant head and neck cancer cancer with retrotracheal mass with metastases- CT yesterday shows invasion into esophagus with contained perforation and mets to other organs.  The EM physician discussed with patient and friend at bedside and reports they are not interested in any procedures/intubation at this time. Given severity of disease, active contained perforation (CT 11/9) and unclear etiology of where bleed is from- mets vs perforation vs tumor, endoscopy would likely be of little benefit in terms of survival and sedation from endoscopy would likely even be harmful to patient and would require airway protection (intubation, which the patient declined) since he is spitting up blood.  At this time, no formal GI consult is being placed per EM provider. GI will be available as needed  Annamaria Helling, DO [facility] Gastroenterology

## 2021-11-03 NOTE — ED Notes (Signed)
Last HR on monitor was 26.

## 2021-11-03 NOTE — ED Triage Notes (Signed)
Pt to ER via [facility] from home after a near syncopal episode this afternoon. States he was standing in his kitchen and became weak after standing for a long period of time. Pt reports he has also had continuing episodes of hematemesis as well as blood backing out into the port of his G-tube. Reports wanting a blood transfusion if he requires one.   Pt reports hx of esophageal varices/ esophageal cancer and has a hole in his esophagus.   Pt on 4L via [place] chronically.

## 2021-11-03 DEATH — deceased

## 2021-11-04 ENCOUNTER — Telehealth: Payer: Medicare Other | Admitting: Hospice and Palliative Medicine

## 2021-12-16 NOTE — Telephone Encounter (Signed)
error 

## 2022-02-01 ENCOUNTER — Ambulatory Visit: Payer: Medicare Other | Admitting: Radiation Oncology

## 2022-03-17 ENCOUNTER — Ambulatory Visit: Payer: Medicare Other | Admitting: Radiology

## 2024-01-08 ENCOUNTER — Other Ambulatory Visit (HOSPITAL_BASED_OUTPATIENT_CLINIC_OR_DEPARTMENT_OTHER): Payer: Self-pay
